# Patient Record
Sex: Male | Born: 1950
Health system: Southern US, Community
[De-identification: ages and names within clinical notes are randomized; demographics above are authoritative.]

## PROBLEM LIST (undated history)

## (undated) ENCOUNTER — Emergency Department (HOSPITAL_COMMUNITY): Discharge status: Absent without leave | Payer: BLUE CROSS/BLUE SHIELD

## (undated) DIAGNOSIS — I739 Peripheral vascular disease, unspecified: Secondary | ICD-10-CM

## (undated) DIAGNOSIS — I10 Essential (primary) hypertension: Secondary | ICD-10-CM

## (undated) DIAGNOSIS — Z8489 Family history of other specified conditions: Secondary | ICD-10-CM

## (undated) DIAGNOSIS — M109 Gout, unspecified: Secondary | ICD-10-CM

## (undated) DIAGNOSIS — I5042 Chronic combined systolic (congestive) and diastolic (congestive) heart failure: Secondary | ICD-10-CM

## (undated) DIAGNOSIS — Z8674 Personal history of sudden cardiac arrest: Secondary | ICD-10-CM

## (undated) DIAGNOSIS — R569 Unspecified convulsions: Secondary | ICD-10-CM

## (undated) DIAGNOSIS — K219 Gastro-esophageal reflux disease without esophagitis: Secondary | ICD-10-CM

## (undated) DIAGNOSIS — C8589 Other specified types of non-Hodgkin lymphoma, extranodal and solid organ sites: Secondary | ICD-10-CM

## (undated) DIAGNOSIS — Z905 Acquired absence of kidney: Secondary | ICD-10-CM

## (undated) DIAGNOSIS — R748 Abnormal levels of other serum enzymes: Secondary | ICD-10-CM

## (undated) DIAGNOSIS — E7800 Pure hypercholesterolemia, unspecified: Secondary | ICD-10-CM

## (undated) DIAGNOSIS — IMO0002 Reserved for concepts with insufficient information to code with codable children: Secondary | ICD-10-CM

## (undated) DIAGNOSIS — R0602 Shortness of breath: Secondary | ICD-10-CM

## (undated) DIAGNOSIS — Z8669 Personal history of other diseases of the nervous system and sense organs: Secondary | ICD-10-CM

## (undated) DIAGNOSIS — Z5181 Encounter for therapeutic drug level monitoring: Secondary | ICD-10-CM

## (undated) DIAGNOSIS — I2510 Atherosclerotic heart disease of native coronary artery without angina pectoris: Secondary | ICD-10-CM

## (undated) DIAGNOSIS — Z87891 Personal history of nicotine dependence: Secondary | ICD-10-CM

## (undated) DIAGNOSIS — E785 Hyperlipidemia, unspecified: Secondary | ICD-10-CM

## (undated) DIAGNOSIS — C801 Malignant (primary) neoplasm, unspecified: Secondary | ICD-10-CM

## (undated) DIAGNOSIS — C8580 Other specified types of non-Hodgkin lymphoma, unspecified site: Secondary | ICD-10-CM

## (undated) HISTORY — PX: EYE SURGERY: SHX253

## (undated) HISTORY — DX: Chronic combined systolic (congestive) and diastolic (congestive) heart failure: I50.42

## (undated) HISTORY — PX: NO PAST SURGERIES: SHX2092

## (undated) HISTORY — DX: Peripheral vascular disease, unspecified: I73.9

## (undated) HISTORY — DX: Personal history of nicotine dependence: Z87.891

## (undated) HISTORY — DX: Gastro-esophageal reflux disease without esophagitis: K21.9

## (undated) HISTORY — DX: Other specified types of non-hodgkin lymphoma, extranodal and solid organ sites: C85.89

## (undated) HISTORY — DX: Atherosclerotic heart disease of native coronary artery without angina pectoris: I25.10

## (undated) HISTORY — DX: Hyperlipidemia, unspecified: E78.5

## (undated) HISTORY — PX: ESOPHAGOGASTRODUODENOSCOPY: SHX1529

## (undated) HISTORY — DX: Reserved for concepts with insufficient information to code with codable children: IMO0002

## (undated) HISTORY — DX: Personal history of other diseases of the nervous system and sense organs: Z86.69

## (undated) HISTORY — DX: Acquired absence of kidney: Z90.5

## (undated) HISTORY — DX: Shortness of breath: R06.02

## (undated) HISTORY — DX: Other specified types of non-hodgkin lymphoma, unspecified site: C85.80

## (undated) HISTORY — DX: Encounter for therapeutic drug level monitoring: Z51.81

## (undated) HISTORY — DX: Gout, unspecified: M10.9

## (undated) HISTORY — DX: Essential (primary) hypertension: I10

## (undated) HISTORY — DX: Abnormal levels of other serum enzymes: R74.8

## (undated) HISTORY — DX: Personal history of sudden cardiac arrest: Z86.74

---

## 2003-06-23 ENCOUNTER — Inpatient Hospital Stay (HOSPITAL_COMMUNITY): Admission: EM | Admit: 2003-06-23 | Discharge: 2003-06-26 | Payer: Self-pay | Admitting: Emergency Medicine

## 2003-07-01 ENCOUNTER — Encounter: Admission: RE | Admit: 2003-07-01 | Discharge: 2003-07-01 | Payer: Self-pay | Admitting: Sports Medicine

## 2004-05-26 ENCOUNTER — Emergency Department: Payer: Self-pay | Admitting: Emergency Medicine

## 2013-06-05 ENCOUNTER — Other Ambulatory Visit: Payer: Self-pay | Admitting: Gastroenterology

## 2013-06-05 DIAGNOSIS — R1310 Dysphagia, unspecified: Secondary | ICD-10-CM

## 2013-06-06 ENCOUNTER — Ambulatory Visit
Admission: RE | Admit: 2013-06-06 | Discharge: 2013-06-06 | Disposition: A | Discharge status: Home / Self Care | Payer: No Typology Code available for payment source | Source: Ambulatory Visit | Attending: Gastroenterology | Admitting: Gastroenterology

## 2013-06-06 DIAGNOSIS — R1310 Dysphagia, unspecified: Secondary | ICD-10-CM

## 2013-06-24 ENCOUNTER — Encounter (HOSPITAL_COMMUNITY): Payer: Self-pay | Admitting: Emergency Medicine

## 2013-06-24 ENCOUNTER — Emergency Department (HOSPITAL_COMMUNITY)
Admission: EM | Admit: 2013-06-24 | Discharge: 2013-06-24 | Disposition: A | Discharge status: Home / Self Care | Payer: No Typology Code available for payment source | Attending: Emergency Medicine | Admitting: Emergency Medicine

## 2013-06-24 DIAGNOSIS — R1013 Epigastric pain: Secondary | ICD-10-CM | POA: Insufficient documentation

## 2013-06-24 DIAGNOSIS — R1310 Dysphagia, unspecified: Secondary | ICD-10-CM

## 2013-06-24 LAB — COMPREHENSIVE METABOLIC PANEL
ALT: 14 U/L (ref 0–53)
AST: 14 U/L (ref 0–37)
Albumin: 4.1 g/dL (ref 3.5–5.2)
Alkaline Phosphatase: 172 U/L — ABNORMAL HIGH (ref 39–117)
BUN: 48 mg/dL — ABNORMAL HIGH (ref 6–23)
CO2: 27 mEq/L (ref 19–32)
Calcium: 9.7 mg/dL (ref 8.4–10.5)
Chloride: 98 mEq/L (ref 96–112)
Creatinine, Ser: 1.45 mg/dL — ABNORMAL HIGH (ref 0.50–1.35)
GFR calc Af Amer: 58 mL/min — ABNORMAL LOW (ref >90)
GFR calc non Af Amer: 50 mL/min — ABNORMAL LOW (ref >90)
Glucose, Bld: 141 mg/dL — ABNORMAL HIGH (ref 70–99)
Potassium: 3.6 mEq/L (ref 3.5–5.1)
Sodium: 137 mEq/L (ref 135–145)
Total Bilirubin: 0.4 mg/dL (ref 0.3–1.2)
Total Protein: 7.6 g/dL (ref 6.0–8.3)

## 2013-06-24 LAB — CBC WITH DIFFERENTIAL/PLATELET
Basophils Absolute: 0 10*3/uL (ref 0.0–0.1)
Basophils Relative: 1 % (ref 0–1)
Eosinophils Absolute: 1.2 10*3/uL — ABNORMAL HIGH (ref 0.0–0.7)
Eosinophils Relative: 13 % — ABNORMAL HIGH (ref 0–5)
HCT: 41.2 % (ref 39.0–52.0)
Hemoglobin: 15 g/dL (ref 13.0–17.0)
Lymphocytes Relative: 21 % (ref 12–46)
Lymphs Abs: 1.8 10*3/uL (ref 0.7–4.0)
MCH: 31.2 pg (ref 26.0–34.0)
MCHC: 36.4 g/dL — ABNORMAL HIGH (ref 30.0–36.0)
MCV: 85.7 fL (ref 78.0–100.0)
Monocytes Absolute: 0.5 10*3/uL (ref 0.1–1.0)
Monocytes Relative: 6 % (ref 3–12)
Neutro Abs: 5.3 10*3/uL (ref 1.7–7.7)
Neutrophils Relative %: 60 % (ref 43–77)
Platelets: 191 10*3/uL (ref 150–400)
RBC: 4.81 MIL/uL (ref 4.22–5.81)
RDW: 12.6 % (ref 11.5–15.5)
WBC: 8.8 10*3/uL (ref 4.0–10.5)

## 2013-06-24 LAB — URINALYSIS, ROUTINE W REFLEX MICROSCOPIC
Bilirubin Urine: NEGATIVE
Glucose, UA: NEGATIVE mg/dL
Hgb urine dipstick: NEGATIVE
Ketones, ur: NEGATIVE mg/dL
Leukocytes, UA: NEGATIVE
Nitrite: NEGATIVE
Protein, ur: 30 mg/dL — AB
Specific Gravity, Urine: 1.024 (ref 1.005–1.030)
Urobilinogen, UA: 0.2 mg/dL (ref 0.0–1.0)
pH: 6.5 (ref 5.0–8.0)

## 2013-06-24 LAB — POCT I-STAT TROPONIN I: Troponin i, poc: 0 ng/mL (ref 0.00–0.08)

## 2013-06-24 LAB — LIPASE, BLOOD: Lipase: 58 U/L (ref 11–59)

## 2013-06-24 LAB — URINE MICROSCOPIC-ADD ON

## 2013-06-24 MED ORDER — SUCRALFATE 1 GM/10ML PO SUSP: 1.0000 g | Freq: Three times a day (TID) | ORAL | Status: DC

## 2013-06-24 MED ORDER — RANITIDINE HCL 150 MG PO TABS: 150.0000 mg | ORAL_TABLET | Freq: Two times a day (BID) | ORAL | Status: DC

## 2013-06-24 MED ORDER — METOCLOPRAMIDE HCL 5 MG/ML IJ SOLN
10.0000 mg | Freq: Once | INTRAMUSCULAR | Status: AC
  Administered 2013-06-24: 10 mg via INTRAVENOUS
  Filled 2013-06-24: qty 2

## 2013-06-24 MED ORDER — FAMOTIDINE IN NACL 20-0.9 MG/50ML-% IV SOLN
20.0000 mg | Freq: Once | INTRAVENOUS | Status: AC
  Administered 2013-06-24: 20 mg via INTRAVENOUS
  Filled 2013-06-24: qty 50

## 2013-06-24 MED ORDER — METOCLOPRAMIDE HCL 10 MG PO TABS: 10.0000 mg | ORAL_TABLET | Freq: Four times a day (QID) | ORAL | Status: DC | PRN

## 2013-06-24 MED ORDER — GI COCKTAIL ~~LOC~~
30.0000 mL | Freq: Once | ORAL | Status: AC
  Administered 2013-06-24: 30 mL via ORAL
  Filled 2013-06-24: qty 30

## 2013-06-24 MED ORDER — ONDANSETRON HCL 4 MG PO TABS: 4.0000 mg | ORAL_TABLET | Freq: Four times a day (QID) | ORAL | Status: DC

## 2013-06-24 NOTE — ED Provider Notes (Signed)
CSN: [id number]     Arrival date & time 06/24/13  1104 History   First MD Initiated Contact with Patient 06/24/13 1123     Chief Complaint  Patient presents with  . Emesis  . Abdominal Pain   (Consider location/radiation/quality/duration/timing/severity/associated sxs/prior Treatment) HPI Comments: Patient presents to the ER for evaluation of upper abdominal pain and inability to deep. Patient has been seen by his primary doctor for this and had an upper GI series performed. Patient reports that he has not improved at all. He has been using Pepto-Bismol and Maalox without improvement. Patient has been regurgitating continuously. He is not nauseated. He has not been able he last 4 days. He is very weak because he hasn't been eating. There has not been any fever. He reports increased belching which brings a clear and greenish liquid.  Patient is a 62 y.o. male presenting with vomiting and abdominal pain.  Emesis Associated symptoms: abdominal pain   Abdominal Pain Associated symptoms: vomiting     History reviewed. No pertinent past medical history. History reviewed. No pertinent past surgical history. No family history on file. History  Substance Use Topics  . Smoking status: Never Smoker   . Smokeless tobacco: Not on file  . Alcohol Use: No    Review of Systems  Gastrointestinal: Positive for vomiting and abdominal pain.  All other systems reviewed and are negative.    Allergies  Review of patient's allergies indicates no known allergies.  Home Medications  No current outpatient prescriptions on file. BP 108/68  Pulse 81  Temp(Src) 97.8 F (36.6 C) (Oral)  Resp 18  Ht 5\' 11"  (1.803 m)  Wt 200 lb (90.719 kg)  BMI 27.91 kg/m2  SpO2 99% Physical Exam  Constitutional: He is oriented to person, place, and time. He appears well-developed and well-nourished. No distress.  HENT:  Head: Normocephalic and atraumatic.  Right Ear: Hearing normal.  Left Ear: Hearing normal.   Nose: Nose normal.  Mouth/Throat: Oropharynx is clear and moist and mucous membranes are normal.  Eyes: Conjunctivae and EOM are normal. Pupils are equal, round, and reactive to light.  Neck: Normal range of motion. Neck supple.  Cardiovascular: Regular rhythm, S1 normal and S2 normal.  Exam reveals no gallop and no friction rub.   No murmur heard. Pulmonary/Chest: Effort normal and breath sounds normal. No respiratory distress. He exhibits no tenderness.  Abdominal: Soft. Normal appearance and bowel sounds are normal. There is no hepatosplenomegaly. There is tenderness (slight) in the epigastric area. There is no rebound, no guarding, no tenderness at McBurney's point and negative Murphy's sign. No hernia.  Musculoskeletal: Normal range of motion.  Neurological: He is alert and oriented to person, place, and time. He has normal strength. No cranial nerve deficit or sensory deficit. Coordination normal. GCS eye subscore is 4. GCS verbal subscore is 5. GCS motor subscore is 6.  Skin: Skin is warm, dry and intact. No rash noted. No cyanosis.  Psychiatric: He has a normal mood and affect. His speech is normal and behavior is normal. Thought content normal.    ED Course  Procedures (including critical care time) Labs Review Labs Reviewed  CBC WITH DIFFERENTIAL  COMPREHENSIVE METABOLIC PANEL  LIPASE, BLOOD  URINALYSIS, ROUTINE W REFLEX MICROSCOPIC   Imaging Review No results found.  EKG Interpretation    Date/Time:  Sunday June 24 2013 11:11:20 EST Ventricular Rate:  87 PR Interval:  126 QRS Duration: 90 QT Interval:  332 QTC Calculation: 399 R Axis:  77 Text Interpretation:  Normal sinus rhythm ST \\T \ T wave abnormality, consider inferior ischemia ST \\T \ T wave abnormality, consider anterolateral ischemia Abnormal ECG No significant change since last tracing Confirmed by POLLINA  MD, CHRISTOPHER ([id number]) on 06/24/2013 11:21:02 AM            MDM   1. Dysphagia    This  presents to the ER for difficulty swallowing and bringing back up clear liquid intermittently when he belches. He has been having this ongoing for one month, seen by his primary doctor. Review of the records reveals previous swallow study without any abnormalities. Patient was treated empirically here in the ER with significant improvement of her symptoms. His workup is unremarkable. Patient is not require hospitalization at this time, would benefit from followup with primary doctor and possibly GI.    Gilda Crease, MD 06/26/13 0730

## 2013-06-24 NOTE — ED Notes (Signed)
Pt states he still feels like he is going to throw up. Pts family at bedside states he has not been "regurgitating" as much.

## 2013-06-24 NOTE — ED Notes (Signed)
Pt alert and mentating appropriately upon d/c. Pt given d/c teaching, prescriptions and follow up care instructions. Pt has no further questions upon d/c. NAD noted upon d/c. Pt ambulatory upon d/c with steady gait leaving with daughter.

## 2013-06-24 NOTE — ED Notes (Signed)
Pt c/o mid upper abdominal pain with n/v x 1 month. Pt has been seen by PMD for same. Pt reports not eating for past 4 days. Pt reports burping up bile colored secretions.

## 2013-08-02 DIAGNOSIS — C801 Malignant (primary) neoplasm, unspecified: Secondary | ICD-10-CM

## 2013-08-02 HISTORY — DX: Malignant (primary) neoplasm, unspecified: C80.1

## 2013-08-02 HISTORY — PX: CORONARY ANGIOPLASTY WITH STENT PLACEMENT: SHX49

## 2013-08-03 ENCOUNTER — Ambulatory Visit: Payer: BC Managed Care – PPO

## 2013-08-03 ENCOUNTER — Ambulatory Visit (INDEPENDENT_AMBULATORY_CARE_PROVIDER_SITE_OTHER): Payer: BC Managed Care – PPO | Admitting: Emergency Medicine

## 2013-08-03 ENCOUNTER — Ambulatory Visit
Admission: RE | Admit: 2013-08-03 | Discharge: 2013-08-03 | Disposition: A | Discharge status: Home / Self Care | Payer: BC Managed Care – PPO | Source: Ambulatory Visit | Attending: Emergency Medicine | Admitting: Emergency Medicine

## 2013-08-03 VITALS — BP 126/72 | HR 107 | Temp 98.1°F | Resp 18 | Wt 170.0 lb

## 2013-08-03 DIAGNOSIS — R634 Abnormal weight loss: Secondary | ICD-10-CM

## 2013-08-03 DIAGNOSIS — R630 Anorexia: Secondary | ICD-10-CM

## 2013-08-03 DIAGNOSIS — R569 Unspecified convulsions: Secondary | ICD-10-CM

## 2013-08-03 DIAGNOSIS — N281 Cyst of kidney, acquired: Secondary | ICD-10-CM

## 2013-08-03 DIAGNOSIS — R195 Other fecal abnormalities: Secondary | ICD-10-CM

## 2013-08-03 DIAGNOSIS — R748 Abnormal levels of other serum enzymes: Secondary | ICD-10-CM

## 2013-08-03 LAB — COMPREHENSIVE METABOLIC PANEL
ALBUMIN: 4.4 g/dL (ref 3.5–5.2)
ALT: 27 U/L (ref 0–53)
AST: 22 U/L (ref 0–37)
Alkaline Phosphatase: 247 U/L — ABNORMAL HIGH (ref 39–117)
BUN: 20 mg/dL (ref 6–23)
CALCIUM: 9.7 mg/dL (ref 8.4–10.5)
CO2: 27 mEq/L (ref 19–32)
Chloride: 99 mEq/L (ref 96–112)
Creat: 1.14 mg/dL (ref 0.50–1.35)
Glucose, Bld: 105 mg/dL — ABNORMAL HIGH (ref 70–99)
POTASSIUM: 4.2 meq/L (ref 3.5–5.3)
SODIUM: 138 meq/L (ref 135–145)
TOTAL PROTEIN: 7.5 g/dL (ref 6.0–8.3)
Total Bilirubin: 0.5 mg/dL (ref 0.3–1.2)

## 2013-08-03 LAB — POCT CBC
Granulocyte percent: 65 %G (ref 37–80)
HCT, POC: 46.6 % (ref 43.5–53.7)
Hemoglobin: 15.1 g/dL (ref 14.1–18.1)
Lymph, poc: 2.7 (ref 0.6–3.4)
MCH, POC: 30.4 pg (ref 27–31.2)
MCHC: 32.4 g/dL (ref 31.8–35.4)
MCV: 93.7 fL (ref 80–97)
MID (cbc): 1.8 — AB (ref 0–0.9)
MPV: 9.8 fL (ref 0–99.8)
POC Granulocyte: 8.3 — AB (ref 2–6.9)
POC LYMPH PERCENT: 20.9 %L (ref 10–50)
POC MID %: 14.1 %M — AB (ref 0–12)
Platelet Count, POC: 219 10*3/uL (ref 142–424)
RBC: 4.97 M/uL (ref 4.69–6.13)
RDW, POC: 14.7 %
WBC: 12.7 10*3/uL — AB (ref 4.6–10.2)

## 2013-08-03 LAB — IFOBT (OCCULT BLOOD): IFOBT: POSITIVE

## 2013-08-03 LAB — GLUCOSE, POCT (MANUAL RESULT ENTRY): POC GLUCOSE: 78 mg/dL (ref 70–99)

## 2013-08-03 LAB — AMYLASE: AMYLASE: 87 U/L (ref 0–105)

## 2013-08-03 LAB — PSA: PSA: 0.58 ng/mL (ref <=4.00)

## 2013-08-03 MED ORDER — ONDANSETRON HCL 4 MG PO TABS: 4.0000 mg | ORAL_TABLET | Freq: Four times a day (QID) | ORAL | Status: DC

## 2013-08-03 MED ORDER — LANSOPRAZOLE 30 MG PO CPDR: DELAYED_RELEASE_CAPSULE | ORAL | Status: DC

## 2013-08-03 MED ORDER — SUCRALFATE 1 G PO TABS: 1.0000 g | ORAL_TABLET | Freq: Three times a day (TID) | ORAL | Status: DC

## 2013-08-03 NOTE — Patient Instructions (Signed)
You can go now for the CT scan at [facility] they will have you drink contrast then have the scan

## 2013-08-03 NOTE — Progress Notes (Addendum)
Subjective:  This chart was scribed for Gilbert Queen, MD by [name], ED scribe.  This patient was seen in [place] [facility] and the patient's care was started at 2:24 PM.   Patient ID: Gilbert [name], male    DOB: Apr 11, 1951, 63 y.o.   MRN: [id number]   HPI  HPI Comments: Gilbert [name] is a 63 y.o. male who presents to [facility] complaining of 2 months of persistent anorexia and weight loss.  Pt states that for the past 2 months he has been vomiting after almost every time he eats.  He has had no appetite and states "everything tastes like dirt."  He has lost from 40-45 pounds in 6 weeks.  He is on an "apple sauce and Ensure diet."  He states "I want to eat, I just can't."  Pt also complains of right-sided chest pain that began 2 weeks ago.  He also has recently experienced some LLQ abdominal pain but this has since resolved.  Pt has seen a GI doctor since his symptoms began (Dr. [name]) and received an upper GI barium and endoscopy which were both unremarkable.  Family is concerned that his symptoms Jackson be related to his pancreas or gallbladder.  He has not smoked in 8 years.  He does not drink alcohol.  He stopped using dip 2 days before Thanksgiving.     There are no active problems to display for this patient.   History reviewed. No pertinent past medical history.  History reviewed. No pertinent past surgical history.  Prior to Admission medications   Medication Sig Start Date End Date Taking? Authorizing Provider  lansoprazole (PREVACID) 15 MG capsule Take 15 mg by mouth daily at 12 noon.   Yes Historical Provider, MD  naproxen sodium (ANAPROX) 220 MG tablet Take 440 mg by mouth every 8 (eight) hours as needed.   Yes Historical Provider, MD  phenytoin (DILANTIN) 300 MG ER capsule Take 300 mg by mouth daily.   Yes Historical Provider, MD  pravastatin (PRAVACHOL) 40 MG tablet Take 40 mg by mouth daily.   Yes Historical Provider, MD  lisinopril-hydrochlorothiazide (PRINZIDE,ZESTORETIC)  20-12.5 MG per tablet Take 1 tablet by mouth daily.    Historical Provider, MD  metoCLOPramide (REGLAN) 10 MG tablet Take 1 tablet (10 mg total) by mouth every 6 (six) hours as needed for nausea (nausea/headache). 06/24/13   Gilbert Greek, MD  ondansetron (ZOFRAN) 4 MG tablet Take 1 tablet (4 mg total) by mouth every 6 (six) hours. 06/24/13   Gilbert Greek, MD  ranitidine (ZANTAC) 150 MG tablet Take 1 tablet (150 mg total) by mouth 2 (two) times daily. 06/24/13   Gilbert Greek, MD  sucralfate (CARAFATE) 1 GM/10ML suspension Take 10 mLs (1 g total) by mouth 4 (four) times daily -  with meals and at bedtime. 06/24/13   Gilbert Greek, MD     Review of Systems  Constitutional: Positive for appetite change and unexpected weight change.  Cardiovascular: Positive for chest pain (right-sided).  Gastrointestinal: Positive for vomiting and abdominal pain (transient LLQ abdominal pain, resolved).        Objective:   Physical Exam CONSTITUTIONAL: Well developed/well nourished HEAD: Normocephalic/atraumatic EYES: EOMI/PERRL ENMT: Mucous membranes moist NECK: supple no meningeal signs SPINE:entire spine nontender CV: S1/S2 noted, no murmurs/rubs/gallops noted LUNGS: Lungs are clear to auscultation bilaterally, no apparent distress ABDOMEN: soft, nontender, no rebound or guarding GU:no cva tenderness NEURO: Pt is awake/alert, moves all extremitiesx4 EXTREMITIES: pulses normal, full  ROM SKIN: warm, color normal PSYCH: no abnormalities of mood noted   BP 126/72  Pulse 107  Temp(Src) 98.1 F (36.7 C) (Oral)  Resp 18  Wt 170 lb (77.111 kg)  SpO2 100%    Results for orders placed in visit on 08/03/13  POCT CBC      Result Value Range   WBC 12.7 (*) 4.6 - 10.2 K/uL   Lymph, poc 2.7  0.6 - 3.4   POC LYMPH PERCENT 20.9  10 - 50 %L   MID (cbc) 1.8 (*) 0 - 0.9   POC MID % 14.1 (*) 0 - 12 %M   POC Granulocyte 8.3 (*) 2 - 6.9   Granulocyte percent 65.0   37 - 80 %G   RBC 4.97  4.69 - 6.13 M/uL   Hemoglobin 15.1  14.1 - 18.1 g/dL   HCT, POC 46.6  43.5 - 53.7 %   MCV 93.7  80 - 97 fL   MCH, POC 30.4  27 - 31.2 pg   MCHC 32.4  31.8 - 35.4 g/dL   RDW, POC 14.7     Platelet Count, POC 219  142 - 424 K/uL   MPV 9.8  0 - 99.8 fL  IFOBT (OCCULT BLOOD)      Result Value Range   IFOBT Positive      [facility] reading (PRIMARY) by  Dr there are 2 possible gallstones in the right upper abdomen. There is some nodularity seen para-aortic area but this Hebert be secondary to stool.       Assessment & Plan:  White count is elevated with a heme positive stool. His abdominal series is suspicious for possible gallstones . I asked him to place his blood pressure medications on hold. I refilled his Carafate Prevacid and Zofran. We'll add a Dilantin level. I told him not to take his blood pressure medications at the present time

## 2013-08-04 LAB — PHENYTOIN LEVEL, TOTAL: PHENYTOIN LVL: 7.9 ug/mL — AB (ref 10.0–20.0)

## 2013-08-07 ENCOUNTER — Telehealth: Payer: Self-pay | Admitting: Radiology

## 2013-08-07 NOTE — Telephone Encounter (Signed)
Message copied by Candice Camp on Tue Aug 07, 2013  3:53 PM ------      Message from: Gilbert [name]      Created: Sat Aug 04, 2013  8:05 AM       His Dilantin level was on the low end his other blood tests including pancreas test was normal. We need to proceed with [name] bone scan and CT of his head. These have been ordered ------

## 2013-08-07 NOTE — Telephone Encounter (Signed)
He has enough refills, will advise to continue current dose.

## 2013-08-07 NOTE — Telephone Encounter (Signed)
Yes he needs to stay on his Dilantin for now. Okay to call in a refill on his prescription with refills for a year

## 2013-08-07 NOTE — Telephone Encounter (Signed)
Patient has ran out of the Dilantin 300mg , he states he has been taking it daily. Do you want him to continue current dose? He has not seen neurology in 30 years CT scan is scheduled for Thursday He is also scheduled for the Bone Scan, explained this to him and his daughter.  Have provided her the number for [facility] so she can check on the referral for the cyst on his kidney.

## 2013-08-08 NOTE — Telephone Encounter (Signed)
Called to advise to continue same dose of the Dilantin.

## 2013-08-09 ENCOUNTER — Ambulatory Visit
Admission: RE | Admit: 2013-08-09 | Discharge: 2013-08-09 | Disposition: A | Discharge status: Home / Self Care | Payer: BC Managed Care – PPO | Source: Ambulatory Visit | Attending: Emergency Medicine | Admitting: Emergency Medicine

## 2013-08-09 DIAGNOSIS — R569 Unspecified convulsions: Secondary | ICD-10-CM

## 2013-08-09 DIAGNOSIS — R634 Abnormal weight loss: Secondary | ICD-10-CM

## 2013-08-10 ENCOUNTER — Telehealth: Payer: Self-pay

## 2013-08-10 NOTE — Telephone Encounter (Signed)
Patient's daughter would like Dr. Everlene Farrier to call her, rather than her father, with the results of the scan. She states he would not fully understand the results.  Best [phone]

## 2013-08-12 NOTE — Telephone Encounter (Signed)
Spoke with pt's daughter and advised her the results of his head CT. Advised to bring pt back in to see Dr Everlene Farrier after his bone scan. She agreed. She is awaiting appointment for bone scan. Can we check on status of referral?

## 2013-08-24 ENCOUNTER — Encounter (HOSPITAL_COMMUNITY)
Admission: RE | Admit: 2013-08-24 | Discharge: 2013-08-24 | Disposition: A | Discharge status: Home / Self Care | Payer: BC Managed Care – PPO | Source: Ambulatory Visit | Attending: Emergency Medicine | Admitting: Emergency Medicine

## 2013-08-24 DIAGNOSIS — R748 Abnormal levels of other serum enzymes: Secondary | ICD-10-CM | POA: Insufficient documentation

## 2013-08-24 MED ORDER — TECHNETIUM TC 99M MEDRONATE IV KIT
25.0000 | PACK | Freq: Once | INTRAVENOUS | Status: AC | PRN
  Administered 2013-08-24: 25 via INTRAVENOUS

## 2013-08-27 ENCOUNTER — Telehealth: Payer: Self-pay | Admitting: Family Medicine

## 2013-08-27 NOTE — Progress Notes (Signed)
spoke to patient concerning message, he is aware

## 2013-08-30 ENCOUNTER — Ambulatory Visit (INDEPENDENT_AMBULATORY_CARE_PROVIDER_SITE_OTHER): Payer: BC Managed Care – PPO | Admitting: Emergency Medicine

## 2013-08-30 VITALS — BP 118/80 | HR 78 | Temp 98.0°F | Resp 18 | Wt 165.0 lb

## 2013-08-30 DIAGNOSIS — Q619 Cystic kidney disease, unspecified: Secondary | ICD-10-CM

## 2013-08-30 DIAGNOSIS — R634 Abnormal weight loss: Secondary | ICD-10-CM

## 2013-08-30 DIAGNOSIS — K921 Melena: Secondary | ICD-10-CM

## 2013-08-30 DIAGNOSIS — N281 Cyst of kidney, acquired: Secondary | ICD-10-CM

## 2013-08-30 LAB — POCT CBC
GRANULOCYTE PERCENT: 66.3 % (ref 37–80)
HEMATOCRIT: 41.7 % — AB (ref 43.5–53.7)
Hemoglobin: 13.2 g/dL — AB (ref 14.1–18.1)
Lymph, poc: 2.1 (ref 0.6–3.4)
MCH, POC: 29.5 pg (ref 27–31.2)
MCHC: 31.7 g/dL — AB (ref 31.8–35.4)
MCV: 93.2 fL (ref 80–97)
MID (cbc): 1.1 — AB (ref 0–0.9)
MPV: 9.1 fL (ref 0–99.8)
POC GRANULOCYTE: 6.4 (ref 2–6.9)
POC LYMPH PERCENT: 21.9 %L (ref 10–50)
POC MID %: 11.5 %M (ref 0–12)
Platelet Count, POC: 276 10*3/uL (ref 142–424)
RBC: 4.47 M/uL — AB (ref 4.69–6.13)
RDW, POC: 14.2 %
WBC: 9.7 10*3/uL (ref 4.6–10.2)

## 2013-08-30 LAB — C-REACTIVE PROTEIN: CRP: 1 mg/dL — AB (ref <0.60)

## 2013-08-30 LAB — URIC ACID: URIC ACID, SERUM: 7.4 mg/dL (ref 4.0–7.8)

## 2013-08-30 LAB — POCT SEDIMENTATION RATE: POCT SED RATE: 65 mm/h — AB (ref 0–22)

## 2013-08-30 LAB — LIPASE: LIPASE: 18 U/L (ref 0–75)

## 2013-08-30 MED ORDER — PREDNISONE 10 MG PO TABS: ORAL_TABLET | ORAL | Status: DC

## 2013-08-30 NOTE — Progress Notes (Signed)
Subjective:    Patient ID: Gilbert [name], male    DOB: 02/14/1951, 63 y.o.   MRN: [id number] This chart was scribed for Gilbert Russian, MD by Anastasia Pall, ED Scribe. This patient was seen in room 09 and the patient's care was started at 1:11 PM.  Chief Complaint  Patient presents with  . Gout    both feet   . Follow-up    wt loss and bone scan    HPI Gilbert [name] is a 63 y.o. male He presents today for a follow up of bone scan. He has h/o gout in bilateral feet, weight loss. He also has h/o kidney problems. He reports pain over his bilateral feet from the gout, with associated swelling. He reports he first started losing weight mid October 2014, stating he weighed 210 lbs then. He reports he has moved on from chicken broth, able to keep down heavier foods. He denies vomiting since last visit. He reports he has still been losing weight, stating he has lost 5 lbs since last visit. He reports he only eats once a day. He states he was taking Ensure and thinks that was the cause of his gout. He has been eating cherries regularly. He states his sense of taste has slowly been coming back. He has been taking cough syrup for intermittent cough. He denies any other associated symptoms. He reports he has been taking Carafate. He denies taking Zofran, due to not feeling nauseous. He denies taking Zantac, but reports taking Prevacid. He states he has been taking his Reglan. He will be making appointment for Urologist after this visit.   PCP - Andreas Blower, MD  Patient Active Problem List   Diagnosis Date Noted  . Loss of weight 08/03/2013   Past Medical History  Diagnosis Date  . Gout    History reviewed. No pertinent past surgical history. No Known Allergies Prior to Admission medications   Medication Sig Start Date End Date Taking? Authorizing Provider  lansoprazole (PREVACID) 30 MG capsule Take one tablet daily 08/03/13  Yes Gilbert Russian, MD  phenytoin (DILANTIN) 300 MG ER capsule  Take 300 mg by mouth daily.   Yes Historical Provider, MD  sucralfate (CARAFATE) 1 G tablet Take 1 tablet (1 g total) by mouth 4 (four) times daily -  with meals and at bedtime. 08/03/13  Yes Gilbert Russian, MD  lisinopril-hydrochlorothiazide (PRINZIDE,ZESTORETIC) 20-12.5 MG per tablet Take 1 tablet by mouth daily.    Historical Provider, MD  metoCLOPramide (REGLAN) 10 MG tablet Take 1 tablet (10 mg total) by mouth every 6 (six) hours as needed for nausea (nausea/headache). 06/24/13   Orpah Greek, MD  naproxen sodium (ANAPROX) 220 MG tablet Take 440 mg by mouth every 8 (eight) hours as needed.    Historical Provider, MD  ondansetron (ZOFRAN) 4 MG tablet Take 1 tablet (4 mg total) by mouth every 6 (six) hours. 08/03/13   Gilbert Russian, MD  pravastatin (PRAVACHOL) 40 MG tablet Take 40 mg by mouth daily.    Historical Provider, MD  ranitidine (ZANTAC) 150 MG tablet Take 1 tablet (150 mg total) by mouth 2 (two) times daily. 06/24/13   Orpah Greek, MD   Review of Systems  Constitutional: Positive for unexpected weight change (loss).  Gastrointestinal: Negative for vomiting.  Musculoskeletal: Positive for arthralgias (bilateral feet, due to h/o gout) and joint swelling (bilateral feet).      Objective:   Physical Exam Nursing note and vitals  reviewed. Constitutional: Pt is oriented to person, place, and time. Pt appears well-developed and well-nourished. No distress.  HENT:  Head: Normocephalic and atraumatic.  Eyes: EOM are normal.  Neck: Neck supple.  Cardiovascular: Normal rate, regular rhythm and normal heart sounds.  Exam reveals no gallop and no friction rub. No murmur heard.  Pulmonary/Chest: Effort normal and breath sounds normal. No respiratory distress. Pt has no wheezes. Pt has no rales.  Abdominal: Soft. No tenderness. No distention, guarding, rebound, masses.  Musculoskeletal: Normal range of motion.  Neurological: Pt is alert and oriented to person, place, and  time.  Skin: Skin is warm and dry.  Psychiatric: Pt has a normal mood and affect. Pt's behavior is normal.   BP 118/80  Pulse 78  Temp(Src) 98 F (36.7 C) (Oral)  Resp 18  Wt 165 lb (74.844 kg)  SpO2 99%   Results for orders placed in visit on 08/03/13  PSA      Result Value Range   PSA 0.58  <=4.00 ng/mL  AMYLASE      Result Value Range   Amylase 87  0 - 105 U/L  COMPREHENSIVE METABOLIC PANEL      Result Value Range   Sodium 138  135 - 145 mEq/L   Potassium 4.2  3.5 - 5.3 mEq/L   Chloride 99  96 - 112 mEq/L   CO2 27  19 - 32 mEq/L   Glucose, Bld 105 (*) 70 - 99 mg/dL   BUN 20  6 - 23 mg/dL   Creat 1.14  0.50 - 1.35 mg/dL   Total Bilirubin 0.5  0.3 - 1.2 mg/dL   Alkaline Phosphatase 247 (*) 39 - 117 U/L   AST 22  0 - 37 U/L   ALT 27  0 - 53 U/L   Total Protein 7.5  6.0 - 8.3 g/dL   Albumin 4.4  3.5 - 5.2 g/dL   Calcium 9.7  8.4 - 10.5 mg/dL  PHENYTOIN LEVEL, TOTAL      Result Value Range   Phenytoin Lvl 7.9 (*) 10.0 - 20.0 ug/mL  POCT CBC      Result Value Range   WBC 12.7 (*) 4.6 - 10.2 K/uL   Lymph, poc 2.7  0.6 - 3.4   POC LYMPH PERCENT 20.9  10 - 50 %L   MID (cbc) 1.8 (*) 0 - 0.9   POC MID % 14.1 (*) 0 - 12 %M   POC Granulocyte 8.3 (*) 2 - 6.9   Granulocyte percent 65.0  37 - 80 %G   RBC 4.97  4.69 - 6.13 M/uL   Hemoglobin 15.1  14.1 - 18.1 g/dL   HCT, POC 46.6  43.5 - 53.7 %   MCV 93.7  80 - 97 fL   MCH, POC 30.4  27 - 31.2 pg   MCHC 32.4  31.8 - 35.4 g/dL   RDW, POC 14.7     Platelet Count, POC 219  142 - 424 K/uL   MPV 9.8  0 - 99.8 fL  IFOBT (OCCULT BLOOD)      Result Value Range   IFOBT Positive    GLUCOSE, POCT (MANUAL RESULT ENTRY)      Result Value Range   POC Glucose 78  70 - 99 mg/dl      Assessment & Plan:  Patient continues with weight loss. He apparently is able to eat better. He is having an active flare of his gout. Referral made to urology for his abnormal  CT with a renal cyst. Referral made to GI because of his inability to be poor  appetite and weight loss. We'll treat with prednisone taper to help with his gout.

## 2013-08-30 NOTE — Progress Notes (Deleted)
Subjective:    Patient ID: Gilbert [name], male    DOB: 06/25/1951, 63 y.o.   MRN: [id number] This chart was scribed for Gilbert Russian, MD by Anastasia Pall, ED Scribe. This patient was seen in room 09 and the patient's care was started at 1:11 PM.  Chief Complaint  Patient presents with   Gout    both feet    Follow-up    wt loss and bone scan    HPI Gilbert [name] is a 63 y.o. male He presents today for a follow up of bone scan. He has h/o gout in bilateral feet, weight loss. He also has h/o kidney problems. He reports pain over his bilateral feet from the gout, with associated swelling. He reports he first started losing weight mid October 2014, stating he weighed 210 lbs then. He reports he has moved on from chicken broth, able to keep down heavier foods. He denies vomiting since last visit. He reports he has still been losing weight, stating he has lost 5 lbs since last visit. He reports he only eats once a day. He states he was taking Ensure and thinks that was the cause of his gout. He has been eating cherries regularly. He states his sense of taste has slowly been coming back. He has been taking cough syrup for intermittent cough. He denies any other associated symptoms. He reports he has been taking Carafate. He denies taking Zofran, due to not feeling nauseous. He denies taking Zantac, but reports taking Prevacid. He states he has been taking his Reglan. He will be making appointment for Urologist after this visit.   PCP - Andreas Blower, MD  Patient Active Problem List   Diagnosis Date Noted   Loss of weight 08/03/2013   Past Medical History  Diagnosis Date   Gout    History reviewed. No pertinent past surgical history. No Known Allergies Prior to Admission medications   Medication Sig Start Date End Date Taking? Authorizing Provider  lansoprazole (PREVACID) 30 MG capsule Take one tablet daily 08/03/13  Yes Gilbert Russian, MD  phenytoin (DILANTIN) 300 MG ER capsule  Take 300 mg by mouth daily.   Yes Historical Provider, MD  sucralfate (CARAFATE) 1 G tablet Take 1 tablet (1 g total) by mouth 4 (four) times daily -  with meals and at bedtime. 08/03/13  Yes Gilbert Russian, MD  lisinopril-hydrochlorothiazide (PRINZIDE,ZESTORETIC) 20-12.5 MG per tablet Take 1 tablet by mouth daily.    Historical Provider, MD  metoCLOPramide (REGLAN) 10 MG tablet Take 1 tablet (10 mg total) by mouth every 6 (six) hours as needed for nausea (nausea/headache). 06/24/13   Orpah Greek, MD  naproxen sodium (ANAPROX) 220 MG tablet Take 440 mg by mouth every 8 (eight) hours as needed.    Historical Provider, MD  ondansetron (ZOFRAN) 4 MG tablet Take 1 tablet (4 mg total) by mouth every 6 (six) hours. 08/03/13   Gilbert Russian, MD  pravastatin (PRAVACHOL) 40 MG tablet Take 40 mg by mouth daily.    Historical Provider, MD  ranitidine (ZANTAC) 150 MG tablet Take 1 tablet (150 mg total) by mouth 2 (two) times daily. 06/24/13   Orpah Greek, MD   Review of Systems  Constitutional: Positive for unexpected weight change (loss).  Gastrointestinal: Negative for vomiting.  Musculoskeletal: Positive for arthralgias (bilateral feet, due to h/o gout) and joint swelling (bilateral feet).      Objective:   Physical Exam Nursing note and vitals  reviewed. Constitutional: Pt is oriented to person, place, and time. Pt appears well-developed and well-nourished. No distress.  HENT:  Head: Normocephalic and atraumatic.  Eyes: EOM are normal.  Neck: Neck supple.  Cardiovascular: Normal rate, regular rhythm and normal heart sounds.  Exam reveals no gallop and no friction rub. No murmur heard.  Pulmonary/Chest: Effort normal and breath sounds normal. No respiratory distress. Pt has no wheezes. Pt has no rales.  Abdominal: Soft. No tenderness. No distention, guarding, rebound, masses.  Musculoskeletal: Normal range of motion.  Neurological: Pt is alert and oriented to person, place, and  time.  Skin: Skin is warm and dry.  Psychiatric: Pt has a normal mood and affect. Pt's behavior is normal.   BP 118/80   Pulse 78   Temp(Src) 98 F (36.7 C) (Oral)   Resp 18   Wt 165 lb (74.844 kg)   SpO2 99%      Assessment & Plan:  Patient continues with weight loss. He apparently is able to eat better. He is having an active flare of his gout.

## 2013-08-31 LAB — PATHOLOGIST SMEAR REVIEW

## 2013-09-06 ENCOUNTER — Other Ambulatory Visit (HOSPITAL_COMMUNITY): Payer: Self-pay | Admitting: Urology

## 2013-09-06 DIAGNOSIS — D49519 Neoplasm of unspecified behavior of unspecified kidney: Secondary | ICD-10-CM

## 2013-09-07 ENCOUNTER — Other Ambulatory Visit: Payer: Self-pay | Admitting: Emergency Medicine

## 2013-09-07 DIAGNOSIS — R634 Abnormal weight loss: Secondary | ICD-10-CM

## 2013-09-07 NOTE — Telephone Encounter (Signed)
Pt notified onocology would be calling to arrage appt.

## 2013-09-07 NOTE — Telephone Encounter (Signed)
Call patient and let him know I spoke with one of the oncology specialist about his weight loss. And about his elevated alkaline phosphatase. They will see the patient and evaluate him. He should receive a call from the office.

## 2013-09-11 LAB — ALKALINE PHOSPHATASE ISOENZYMES
ALKALINE PHOSPHATASE (ALP ISO): 212 U/L — AB (ref 40–115)
Bone Isoenzymes: 8 % — ABNORMAL LOW (ref 28–66)
INTESTINAL ISOENZYMES (ALP ISO): 0 % — AB (ref 1–24)
LIVER ISOENZYMES (ALP ISO): 92 % — AB (ref 25–69)
Macrohepatic isoenzymes: 0 %

## 2013-09-14 ENCOUNTER — Ambulatory Visit (INDEPENDENT_AMBULATORY_CARE_PROVIDER_SITE_OTHER): Payer: BC Managed Care – PPO | Admitting: Emergency Medicine

## 2013-09-14 ENCOUNTER — Ambulatory Visit (HOSPITAL_COMMUNITY)
Admission: RE | Admit: 2013-09-14 | Discharge: 2013-09-14 | Disposition: A | Discharge status: Home / Self Care | Payer: BC Managed Care – PPO | Source: Ambulatory Visit | Attending: Urology | Admitting: Urology

## 2013-09-14 VITALS — BP 130/60 | HR 73 | Temp 98.0°F | Resp 16 | Ht 70.0 in | Wt 174.0 lb

## 2013-09-14 DIAGNOSIS — R748 Abnormal levels of other serum enzymes: Secondary | ICD-10-CM

## 2013-09-14 DIAGNOSIS — R197 Diarrhea, unspecified: Secondary | ICD-10-CM | POA: Insufficient documentation

## 2013-09-14 DIAGNOSIS — D49519 Neoplasm of unspecified behavior of unspecified kidney: Secondary | ICD-10-CM

## 2013-09-14 DIAGNOSIS — M79671 Pain in right foot: Secondary | ICD-10-CM

## 2013-09-14 DIAGNOSIS — M109 Gout, unspecified: Secondary | ICD-10-CM

## 2013-09-14 DIAGNOSIS — R1110 Vomiting, unspecified: Secondary | ICD-10-CM | POA: Insufficient documentation

## 2013-09-14 DIAGNOSIS — D4959 Neoplasm of unspecified behavior of other genitourinary organ: Secondary | ICD-10-CM | POA: Insufficient documentation

## 2013-09-14 DIAGNOSIS — R634 Abnormal weight loss: Secondary | ICD-10-CM

## 2013-09-14 DIAGNOSIS — M79609 Pain in unspecified limb: Secondary | ICD-10-CM

## 2013-09-14 MED ORDER — GADOBENATE DIMEGLUMINE 529 MG/ML IV SOLN
15.0000 mL | Freq: Once | INTRAVENOUS | Status: AC | PRN
  Administered 2013-09-14: 15 mL via INTRAVENOUS

## 2013-09-14 MED ORDER — PREDNISONE 10 MG PO TABS: ORAL_TABLET | ORAL | Status: DC

## 2013-09-14 NOTE — Progress Notes (Signed)
   Subjective:    Patient ID: Gilbert [name], male    DOB: 12-12-1950, 63 y.o.   MRN: [id number]  HPI patient here with redness pain and swelling in his right foot. He has a history of gout. He recently took some prednisone and it helped. He is going to need better. He is scheduled for an MRI later today to further evaluate a cyst. On his last visit here I did fractionate his alkaline phosphatase and it was found to be of liver origin .    Review of Systems     Objective:   Physical Exam the top of the right foot is red tender and swollen. There is significant tenderness at the base of the right great toe.        Assessment & Plan:  Patient here with gout right foot. We'll treat with prednisone and has Lortab per. He's going to have his MRI of the liver today. Will followup next week at that time had the results of his MRI and we can recheck his alkaline phosphatase. Would also consider doing a hep C test

## 2013-09-19 ENCOUNTER — Other Ambulatory Visit: Payer: Self-pay | Admitting: Urology

## 2013-09-25 ENCOUNTER — Ambulatory Visit (INDEPENDENT_AMBULATORY_CARE_PROVIDER_SITE_OTHER): Payer: BC Managed Care – PPO | Admitting: Emergency Medicine

## 2013-09-25 ENCOUNTER — Ambulatory Visit: Payer: BC Managed Care – PPO | Admitting: Emergency Medicine

## 2013-09-25 VITALS — BP 191/97 | HR 78 | Temp 97.8°F | Resp 16 | Ht 70.0 in | Wt 177.0 lb

## 2013-09-25 DIAGNOSIS — H547 Unspecified visual loss: Secondary | ICD-10-CM

## 2013-09-25 DIAGNOSIS — I2584 Coronary atherosclerosis due to calcified coronary lesion: Secondary | ICD-10-CM

## 2013-09-25 DIAGNOSIS — I10 Essential (primary) hypertension: Secondary | ICD-10-CM

## 2013-09-25 DIAGNOSIS — I2510 Atherosclerotic heart disease of native coronary artery without angina pectoris: Secondary | ICD-10-CM

## 2013-09-25 LAB — COMPLETE METABOLIC PANEL WITH GFR
ALT: 10 U/L (ref 0–53)
AST: 12 U/L (ref 0–37)
Albumin: 4.3 g/dL (ref 3.5–5.2)
Alkaline Phosphatase: 120 U/L — ABNORMAL HIGH (ref 39–117)
BILIRUBIN TOTAL: 0.3 mg/dL (ref 0.2–1.2)
BUN: 19 mg/dL (ref 6–23)
CO2: 28 meq/L (ref 19–32)
CREATININE: 0.85 mg/dL (ref 0.50–1.35)
Calcium: 9.8 mg/dL (ref 8.4–10.5)
Chloride: 103 mEq/L (ref 96–112)
GFR, Est Non African American: >89 mL/min
Glucose, Bld: 102 mg/dL — ABNORMAL HIGH (ref 70–99)
Potassium: 4.5 mEq/L (ref 3.5–5.3)
Sodium: 141 mEq/L (ref 135–145)
Total Protein: 7 g/dL (ref 6.0–8.3)

## 2013-09-25 LAB — HEPATITIS C ANTIBODY: HCV AB: NEGATIVE

## 2013-09-25 MED ORDER — LISINOPRIL 20 MG PO TABS: 20.0000 mg | ORAL_TABLET | Freq: Every day | ORAL | Status: DC

## 2013-09-25 MED ORDER — LANSOPRAZOLE 30 MG PO CPDR: DELAYED_RELEASE_CAPSULE | ORAL | Status: DC

## 2013-09-25 NOTE — Progress Notes (Addendum)
Subjective:    Patient ID: Gilbert [name], male    DOB: 12/12/1950, 63 y.o.   MRN: [id number] This chart was scribed for [name], MD by Anastasia Pall, ED Scribe. This patient was seen in room 12 and the patient's care was started at 11:19 AM.  Chief Complaint  Patient presents with  . Follow-up    wt. loss, BP, surgery  . Eye Problem    sees black spots  . Medication Refill   HPI Gilbert [name] is a 63 y.o. male Pt presents for weight loss, BP, and surgery follow up. He has h/o gout, renal cyst. Has surgery for cyst 10/19/13, by Dr. Tresa Moore. Pt has h/o severe weight loss since 05/2013. Weight then was 210 lbs. Has been following up to figure out underlying issue, cause.   He reports being ready for surgery, a bit anxious.   His weight is up from last visit, 177 lbs today. BP the other day was 130/50, BP today 191/97. Recheck in room BP right arm - 168/90, left arm - 168/88.  He denies h/o heart trouble. He reports h/o smoking, last quit 10 years ago, after 41 years.   He also reports seeing black spots in his left eye, decreased vision, onset a few days ago.  PCP - Andreas Blower, MD  Patient Active Problem List   Diagnosis Date Noted  . Renal cyst 08/30/2013  . Loss of weight 08/03/2013   Past Medical History  Diagnosis Date  . Gout    No past surgical history on file. No Known Allergies Prior to Admission medications   Medication Sig Start Date End Date Taking? Authorizing Provider  lansoprazole (PREVACID) 30 MG capsule Take one tablet daily 08/03/13   [name], MD  lisinopril-hydrochlorothiazide (PRINZIDE,ZESTORETIC) 20-12.5 MG per tablet Take 1 tablet by mouth daily.    Historical Provider, MD  metoCLOPramide (REGLAN) 10 MG tablet Take 1 tablet (10 mg total) by mouth every 6 (six) hours as needed for nausea (nausea/headache). 06/24/13   Orpah Greek, MD  naproxen sodium (ANAPROX) 220 MG tablet Take 440 mg by mouth every 8 (eight) hours as needed.     Historical Provider, MD  ondansetron (ZOFRAN) 4 MG tablet Take 1 tablet (4 mg total) by mouth every 6 (six) hours. 08/03/13   [name], MD  phenytoin (DILANTIN) 300 MG ER capsule Take 300 mg by mouth daily.    Historical Provider, MD  pravastatin (PRAVACHOL) 40 MG tablet Take 40 mg by mouth daily.    Historical Provider, MD  predniSONE (DELTASONE) 10 MG tablet Take 4 tablets daily for one full week then 3 tablets a day for 3 days 2 tablets a day for 3 days one tablet a day for 3 days and 09/14/13   [name], MD  ranitidine (ZANTAC) 150 MG tablet Take 1 tablet (150 mg total) by mouth 2 (two) times daily. 06/24/13   Orpah Greek, MD  sucralfate (CARAFATE) 1 G tablet Take 1 tablet (1 g total) by mouth 4 (four) times daily -  with meals and at bedtime. 08/03/13   [name], MD   Review of Systems  Eyes: Positive for visual disturbance (black spots, left eye).  All other systems reviewed and are negative.      Objective:   Physical Exam Nursing note and vitals reviewed. Constitutional: Pt is oriented to person, place, and time. Pt appears well-developed and well-nourished. No distress.  HENT: Right TM nl. Left TM  nl. Oropharynx clear and moist, no exudate. Nose nl.  Head: Normocephalic and atraumatic.  Eyes: EOM are normal. Pupils are equal, round, and reactive to light.  Neck: Neck supple. No thyromegaly. No cervical adenopathy.  Cardiovascular: Normal rate, regular rhythm and normal heart sounds.  Exam reveals no gallop and no friction rub. No murmur heard. Pulmonary/Chest: Effort normal and breath sounds normal. No respiratory distress. Pt has no wheezes. Pt has no rales.  Abdominal: Soft. Bowel sounds are normal. There is no tenderness. There is no rebound and no guarding. No hepatosplenomegaly. No CVA tenderness.  Musculoskeletal: Normal range of motion. No tenderness. No edema.   Neurological: Pt is alert and oriented to person, place, and time.  Skin: Skin is warm and  dry.  Psychiatric: Pt has a normal mood and affect. Pt's behavior is normal.   BP 191/97  Pulse 78  Temp(Src) 97.8 F (36.6 C)  Resp 16  Ht 5\' 10"  (1.778 m)  Wt 177 lb (80.287 kg)  BMI 25.40 kg/m2  SpO2 98%  Recheck BP right arm - 168/90, left arm - 168/88    Assessment & Plan:   Patient declined flu shot today. His EKG is normal. I restarted his lisinopril 20. I left him off the HCTZ because of his recent gout. Addendum to Dr. Zenia Resides  office because of his left visual changes. Appointment was made with cardiology for their clearance. The liver appears normal on MRI however the alkaline phosphatase elevation is coming from the liver. Repeat level done today along with a hep C test. He has been able to gain some weight since his last visit . He is scheduled for renal surgery for suspected cancer in March.     I personally performed the services described in this documentation, which was scribed in my presence. The recorded information has been reviewed and is accurate. **Disclaimer: This note was dictated with voice recognition software. Similar sounding words can inadvertently be transcribed and this note [place] contain transcription errors which [place] not have been corrected upon publication of note.**

## 2013-09-28 ENCOUNTER — Telehealth: Payer: Self-pay

## 2013-09-28 NOTE — Telephone Encounter (Signed)
pts daughter called stating this patient has gout, but per dr Everlene Farrier he did not want to put him on prednisone due to up coming surgery. He has a severe flare up and they want to know what to do

## 2013-09-29 NOTE — Telephone Encounter (Signed)
Ok to give one refill on the prednisone for his gout.Have him see me Wed.

## 2013-09-30 ENCOUNTER — Other Ambulatory Visit: Payer: Self-pay | Admitting: Family Medicine

## 2013-09-30 DIAGNOSIS — M109 Gout, unspecified: Secondary | ICD-10-CM

## 2013-09-30 MED ORDER — PREDNISONE 10 MG PO TABS: ORAL_TABLET | ORAL | Status: DC

## 2013-09-30 NOTE — Telephone Encounter (Signed)
Spoke with patient and sent order into [facility]

## 2013-10-04 DIAGNOSIS — Z0271 Encounter for disability determination: Secondary | ICD-10-CM

## 2013-10-05 ENCOUNTER — Telehealth: Payer: Self-pay | Admitting: Hematology & Oncology

## 2013-10-05 NOTE — Telephone Encounter (Signed)
Left vm w NEW PATIENT today to remind them of their appointment with Dr. Ennever. Also, advised them to bring all meds and insurance information. ° °

## 2013-10-08 ENCOUNTER — Telehealth: Payer: Self-pay | Admitting: Hematology & Oncology

## 2013-10-08 ENCOUNTER — Other Ambulatory Visit (HOSPITAL_BASED_OUTPATIENT_CLINIC_OR_DEPARTMENT_OTHER): Payer: BC Managed Care – PPO | Admitting: Lab

## 2013-10-08 ENCOUNTER — Encounter: Payer: Self-pay | Admitting: Hematology & Oncology

## 2013-10-08 ENCOUNTER — Encounter: Payer: Self-pay | Admitting: Family Medicine

## 2013-10-08 ENCOUNTER — Ambulatory Visit (HOSPITAL_BASED_OUTPATIENT_CLINIC_OR_DEPARTMENT_OTHER): Payer: BC Managed Care – PPO | Admitting: Hematology & Oncology

## 2013-10-08 ENCOUNTER — Ambulatory Visit: Payer: BC Managed Care – PPO

## 2013-10-08 VITALS — BP 146/67 | HR 61 | Temp 97.8°F | Resp 18 | Ht 69.0 in | Wt 179.0 lb

## 2013-10-08 DIAGNOSIS — N289 Disorder of kidney and ureter, unspecified: Secondary | ICD-10-CM

## 2013-10-08 DIAGNOSIS — R634 Abnormal weight loss: Secondary | ICD-10-CM

## 2013-10-08 DIAGNOSIS — N281 Cyst of kidney, acquired: Secondary | ICD-10-CM

## 2013-10-08 DIAGNOSIS — D751 Secondary polycythemia: Secondary | ICD-10-CM

## 2013-10-08 LAB — CBC WITH DIFFERENTIAL (CANCER CENTER ONLY)
BASO#: 0 10*3/uL (ref 0.0–0.2)
BASO%: 0.3 % (ref 0.0–2.0)
EOS ABS: 0.3 10*3/uL (ref 0.0–0.5)
EOS%: 2.6 % (ref 0.0–7.0)
HCT: 41.5 % (ref 38.7–49.9)
HGB: 13.8 g/dL (ref 13.0–17.1)
LYMPH#: 1.2 10*3/uL (ref 0.9–3.3)
LYMPH%: 10.1 % — AB (ref 14.0–48.0)
MCH: 28.8 pg (ref 28.0–33.4)
MCHC: 33.3 g/dL (ref 32.0–35.9)
MCV: 87 fL (ref 82–98)
MONO#: 0.4 10*3/uL (ref 0.1–0.9)
MONO%: 3.4 % (ref 0.0–13.0)
NEUT#: 10 10*3/uL — ABNORMAL HIGH (ref 1.5–6.5)
NEUT%: 83.6 % — ABNORMAL HIGH (ref 40.0–80.0)
PLATELETS: 201 10*3/uL (ref 145–400)
RBC: 4.8 10*6/uL (ref 4.20–5.70)
RDW: 12.8 % (ref 11.1–15.7)
WBC: 12 10*3/uL — AB (ref 4.0–10.0)

## 2013-10-08 LAB — CMP (CANCER CENTER ONLY)
ALK PHOS: 105 U/L — AB (ref 26–84)
ALT: 18 U/L (ref 10–47)
AST: 17 U/L (ref 11–38)
Albumin: 3.3 g/dL (ref 3.3–5.5)
BILIRUBIN TOTAL: 0.4 mg/dL (ref 0.20–1.60)
BUN, Bld: 16 mg/dL (ref 7–22)
CO2: 31 mEq/L (ref 18–33)
CREATININE: 0.8 mg/dL (ref 0.6–1.2)
Calcium: 9.2 mg/dL (ref 8.0–10.3)
Chloride: 104 mEq/L (ref 98–108)
Glucose, Bld: 80 mg/dL (ref 73–118)
Potassium: 4.3 mEq/L (ref 3.3–4.7)
SODIUM: 142 meq/L (ref 128–145)
TOTAL PROTEIN: 6.8 g/dL (ref 6.4–8.1)

## 2013-10-08 LAB — CHCC SATELLITE - SMEAR

## 2013-10-08 NOTE — Telephone Encounter (Signed)
Left pt message with 4-21 follow up

## 2013-10-08 NOTE — Progress Notes (Signed)
Referral MD  Reason for Referral: Right kidney mass   HPI: [name] is a nice 63 year old gentleman. He's been pretty decent health. He does have a history of high blood pressure. Is also a history of a hyper lipidemia. He is also on Dilantin.  He's been followed by Dr. Everlene [name] of internal medicine. He's been having some issues with weight loss. He's lost some more about him about 40 pounds. He said he just did not have much of an appetite. There is no change in bowel or bladder habits. He had no bleeding. He did have some night sweats. There is no cough.  He was that he had a lab work done. His white cell count was a little high side. He was not really anemic. Had elevated sed rate is 65.  He did have a CT scan done. This was done back in early January. The CT scan showed what was felt to be a complex cyst in the right kidney.  An MRI was done on February 15 of this year. This showed a complex 1 x 4.4 x 4.4 cm lesion in the right kidney. This was felt to be concerning for a renal cell carcinoma.  There is no evidence of any type of metastatic disease.  He has been seen by urology. He is going to have a laparoscopic right nephrectomy on March 20.  He has had no pain. He's had no rashes. He's had no leg swelling. He's had no headache. He's had no swallowing difficulties. There's no visual issues.  Overall, he is a very good performance status.  Chief Complaint  Patient presents with  . NEW PATIENT    Past Medical History  Diagnosis Date  . Gout   :  No past surgical history on file.:  Current outpatient prescriptions:lansoprazole (PREVACID) 30 MG capsule, Take one tablet daily, Disp: 30 capsule, Rfl: 5;  lisinopril (PRINIVIL,ZESTRIL) 20 MG tablet, Take 1 tablet (20 mg total) by mouth daily., Disp: 30 tablet, Rfl: 11;  metoprolol tartrate (LOPRESSOR) 25 MG tablet, , Disp: , Rfl: ;  phenytoin (DILANTIN) 300 MG ER capsule, Take 300 mg by mouth daily., Disp: , Rfl:  pravastatin (PRAVACHOL)  40 MG tablet, Take 40 mg by mouth daily., Disp: , Rfl: ;  predniSONE (DELTASONE) 10 MG tablet, Take 4 tablets daily for one full week then 3 tablets a day for 3 days 2 tablets a day for 3 days one tablet a day for 3 days and, Disp: 46 tablet, Rfl: 0;  sucralfate (CARAFATE) 1 G tablet, Take 1 tablet (1 g total) by mouth 4 (four) times daily -  with meals and at bedtime., Disp: 120 tablet, Rfl: 0:  :  No Known Allergies:  Family History  Problem Relation Age of Onset  . Stroke Mother   . Heart disease Brother   . Hyperlipidemia Brother   :  History   Social History  . Marital Status: Single    Spouse Name: N/A    Number of Children: N/A  . Years of Education: N/A   Occupational History  . Not on file.   Social History Main Topics  . Smoking status: Former Smoker -- 2.00 packs/day for 44 years    Types: Cigarettes    Start date: 06/11/1959    Quit date: 10/09/2002  . Smokeless tobacco: Not on file     Comment: quit 10 years ago  . Alcohol Use: No  . Drug Use: No  . Sexual Activity: Not on file  Other Topics Concern  . Not on file   Social History Narrative  . No narrative on file  :  Pertinent items are noted in HPI.  Exam: @IPVITALS @ Will develop well-nourished gentleman. Vital signs are temperature of 97.8. Pulse is 61. Blood pressure 146/67. Weight 179 pounds. Head and neck exam shows no ocular or oral lesions. There is no adenopathy in the neck. Thyroid is nonpalpable. Lungs are clear bilaterally. Cardiac exam regular in rhythm with no murmurs rubs or bruits. Abdomen soft. No guarding or rebound tenderness. There is no mass in the right flank. There is no palpable liver or spleen tip. Back exam no tenderness over the spine ribs or hips. Extremities shows no clubbing cyanosis or edema. Skin exam no rashes ecchymoses or petechia. Neurological exam no focal neurological deficits.   Recent Labs  10/08/13 1250  WBC 12.0*  HGB 13.8  HCT 41.5  PLT 201    Recent  Labs  10/08/13 1251  NA 142  K 4.3  CL 104  CO2 31  GLUCOSE 80  BUN 16  CREATININE 0.8  CALCIUM 9.2    Blood smear review: None  Pathology: None     Assessment and Plan: Gilbert [name] is a 63 year old gentleman. 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 Kidney cancers are notorious for causing systemic issues without having actual metastatic spread. Typically, anemia, or even erythrocytosis can be seen. Hypercalcemia is common. They can have an elevated alkaline phosphatase. He they have night sweats. Weight loss is not uncommon.  Again I don't see anything that looks like metastatic disease. I don't think we need to put him through a bunch of scans right now as he is asymptomatic. It would not change the fact that he needs to have the right kidney removed. In the with metastatic disease, nephrectomies are helpful.  I spent a good 45 minutes with him. I explained what was going on. I explained to him how we follow patient to have had kidney cancer resected.  I probably will see him back in 6 weeks. I probably will plan for some baseline scans about 2 months or so after his surgery.  I will see him in the hospital after he has his surgery. Hopefully, this will be a clear cell carcinoma histology. It looks like this [place] be the case on the MRI report.  Gilbert [name] is very nice. He does have a good sense of humor. He is in good shape.

## 2013-10-09 LAB — LACTATE DEHYDROGENASE: LDH: 136 U/L (ref 94–250)

## 2013-10-09 LAB — TSH CHCC: TSH: 0.721 m[IU]/L (ref 0.320–4.118)

## 2013-10-09 LAB — PREALBUMIN: PREALBUMIN: 30.4 mg/dL (ref 17.0–34.0)

## 2013-10-09 LAB — ERYTHROPOIETIN

## 2013-10-10 ENCOUNTER — Ambulatory Visit (INDEPENDENT_AMBULATORY_CARE_PROVIDER_SITE_OTHER): Payer: BC Managed Care – PPO | Admitting: Emergency Medicine

## 2013-10-10 VITALS — BP 138/74 | HR 62 | Temp 97.6°F | Resp 16 | Ht 69.0 in | Wt 179.8 lb

## 2013-10-10 DIAGNOSIS — I10 Essential (primary) hypertension: Secondary | ICD-10-CM

## 2013-10-10 DIAGNOSIS — N289 Disorder of kidney and ureter, unspecified: Secondary | ICD-10-CM

## 2013-10-10 DIAGNOSIS — N2889 Other specified disorders of kidney and ureter: Secondary | ICD-10-CM | POA: Insufficient documentation

## 2013-10-10 DIAGNOSIS — R634 Abnormal weight loss: Secondary | ICD-10-CM

## 2013-10-10 DIAGNOSIS — E785 Hyperlipidemia, unspecified: Secondary | ICD-10-CM

## 2013-10-10 NOTE — Progress Notes (Signed)
   Subjective:   Patient ID: Gilbert [name], male    DOB: [birth year], 63 y.o.   MRN: [id number]  This chart was scribed for Gilbert Queen, MD by Gilbert [name], ED Scribe. This patient was seen in room 4 and the patient's care was started at 11:35AM.    Gilbert Reichmann, MD  HPI HPI Comments: Gilbert [name] is a 63 y.o. male, with a history of HTN and hyperlipidemia who presents to the [facility] for a f/u visit regarding his upcoming right nephrectomy (for a mass presumed to be cancerous) with surgeon, Dr. Tresa [name]. Pt reports that he attended all of his pre-operative appointments thus far and states he is prepared to move forward with his nephrectomy. Further, pt reports that the issues with his eyes have resolved. He states his eyes were "welded" and he has a follow-up with his opthomologist this week. Pt denies any nausea. Pt states that his current weight is at 179 lbs.   Pt is a former smoker; his quit date was 10/09/2002. Prior to quitting he smoked 2 ppd.    Review of Systems  Constitutional: Negative.  Negative for fatigue and unexpected weight change.  Respiratory: Negative for cough, chest tightness and shortness of breath.   Cardiovascular: Negative for chest pain, palpitations and leg swelling.  Gastrointestinal: Negative for abdominal pain and blood in stool.  Musculoskeletal:       Decreased pain in foot since taking prednisone  Neurological: Negative for dizziness, light-headedness and headaches.  Psychiatric/Behavioral: Negative.    Objective:   Physical Exam  CONSTITUTIONAL: Well developed/well nourished HEAD: Normocephalic/atraumatic EYES: EOMI/PERRL ENMT: Mucous membranes moist NECK: supple no meningeal signs SPINE:entire spine nontender CV: S1/S2 noted, no murmurs/rubs/gallops noted LUNGS: Lungs are clear to auscultation bilaterally, no apparent distress ABDOMEN: soft, nontender, no rebound or guarding GU:no cva tenderness NEURO: Pt is awake/alert, moves all  extremitiesx4 EXTREMITIES: pulses normal, full ROM. Mild erythema over the right second and third toes.  SKIN: warm, color normal PSYCH: no abnormalities of mood noted  Filed Vitals:   10/10/13 1033  BP: 138/74  Pulse: 62  Temp: 97.6 F (36.4 C)  TempSrc: Oral  Resp: 16  Height: 5\' 9"  (1.753 m)  Weight: 179 lb 12.8 oz (81.557 kg)  SpO2: 97%   Assessment & Plan:  Patient is ready for surgery. His gout is improved after a course of prednisone. Will see the patient in about a month after surgery. Cholesterol was done today I personally performed the services described in this documentation, which was scribed in my presence. The recorded information has been reviewed and is accurate.

## 2013-10-11 ENCOUNTER — Encounter (HOSPITAL_COMMUNITY): Payer: Self-pay | Admitting: Pharmacy Technician

## 2013-10-11 LAB — LIPID PANEL
Cholesterol: 224 mg/dL — ABNORMAL HIGH (ref 0–200)
HDL: 63 mg/dL (ref >39)
LDL Cholesterol: 130 mg/dL — ABNORMAL HIGH (ref 0–99)
Total CHOL/HDL Ratio: 3.6 Ratio
Triglycerides: 157 mg/dL — ABNORMAL HIGH (ref <150)
VLDL: 31 mg/dL (ref 0–40)

## 2013-10-12 ENCOUNTER — Telehealth: Payer: Self-pay

## 2013-10-12 NOTE — Telephone Encounter (Signed)
Pt is returning our call for lab results °

## 2013-10-14 NOTE — Telephone Encounter (Signed)
See labs 

## 2013-10-15 ENCOUNTER — Encounter (HOSPITAL_COMMUNITY): Payer: Self-pay

## 2013-10-15 ENCOUNTER — Encounter (HOSPITAL_COMMUNITY)
Admission: RE | Admit: 2013-10-15 | Discharge: 2013-10-15 | Disposition: A | Discharge status: Home / Self Care | Payer: BC Managed Care – PPO | Source: Ambulatory Visit | Attending: Urology | Admitting: Urology

## 2013-10-15 DIAGNOSIS — Z01812 Encounter for preprocedural laboratory examination: Secondary | ICD-10-CM | POA: Insufficient documentation

## 2013-10-15 HISTORY — DX: Gastro-esophageal reflux disease without esophagitis: K21.9

## 2013-10-15 HISTORY — DX: Essential (primary) hypertension: I10

## 2013-10-15 HISTORY — DX: Malignant (primary) neoplasm, unspecified: C80.1

## 2013-10-15 HISTORY — DX: Pure hypercholesterolemia, unspecified: E78.00

## 2013-10-15 HISTORY — DX: Shortness of breath: R06.02

## 2013-10-15 HISTORY — DX: Family history of other specified conditions: Z84.89

## 2013-10-15 LAB — CBC
HCT: 41.8 % (ref 39.0–52.0)
Hemoglobin: 13.8 g/dL (ref 13.0–17.0)
MCH: 28.7 pg (ref 26.0–34.0)
MCHC: 33 g/dL (ref 30.0–36.0)
MCV: 86.9 fL (ref 78.0–100.0)
Platelets: 185 10*3/uL (ref 150–400)
RBC: 4.81 MIL/uL (ref 4.22–5.81)
RDW: 13.2 % (ref 11.5–15.5)
WBC: 12.1 10*3/uL — AB (ref 4.0–10.5)

## 2013-10-15 LAB — BASIC METABOLIC PANEL
BUN: 16 mg/dL (ref 6–23)
CO2: 28 mEq/L (ref 19–32)
Calcium: 9.9 mg/dL (ref 8.4–10.5)
Chloride: 100 mEq/L (ref 96–112)
Creatinine, Ser: 0.73 mg/dL (ref 0.50–1.35)
GFR calc non Af Amer: >90 mL/min (ref >90)
Glucose, Bld: 65 mg/dL — ABNORMAL LOW (ref 70–99)
Potassium: 4.7 mEq/L (ref 3.7–5.3)
Sodium: 139 mEq/L (ref 137–147)

## 2013-10-15 LAB — ABO/RH: ABO/RH(D): A POS

## 2013-10-15 NOTE — Patient Instructions (Addendum)
34 Gilbert [name]  10/15/2013   Your procedure is scheduled on: 10/19/13  Report to [facility] at 9:30 AM.  Call this number if you have problems the morning of surgery [phone]: [phone]   Remember:   Do not eat food or drink liquids After Midnight.     Take these medicines the morning of surgery with A SIP OF WATER: metoprolol, pravastatin, dilantin, prevacid   Do not wear jewelry, make-up or nail polish.  Do not wear lotions, powders, or perfumes. You Sportsman wear deodorant.  Do not shave 48 hours prior to surgery. Men Alexa shave face and neck.  Do not bring valuables to the hospital.  Contacts, dentures or bridgework [place] not be worn into surgery.  Leave suitcase in the car. After surgery it [place] be brought to your room.  For patients admitted to the hospital, checkout time is 11:00 AM the day of discharge.    Please read over the following fact sheets that you were given:[place] preparing for surgery sheet, blood fact sheet Paulette Blanch, RN  pre op nurse call if needed [phone]    FAILURE TO [place]   Patient Signature: ___________________________________________

## 2013-10-15 NOTE — Progress Notes (Signed)
10/15/13 1319  OBSTRUCTIVE SLEEP APNEA  Have you ever been diagnosed with sleep apnea through a sleep study? No  Do you snore loudly (loud enough to be heard through closed doors)?  1  Do you often feel tired, fatigued, or sleepy during the daytime? 0  Has anyone observed you stop breathing during your sleep? 0  Do you have, or are you being treated for high blood pressure? 1  BMI more than 35 kg/m2? 0  Age over 63 years old? 1  Neck circumference greater than 40 cm/18 inches? 0  Gender: 1  Obstructive Sleep Apnea Score 4  Score 4 or greater  Results sent to PCP

## 2013-10-15 NOTE — Progress Notes (Signed)
Stress test and cardiac clearance 10/05/13 Dr. Einar Gip on chart, LOV note 10/03/13 Dr. Einar Gip on chart, Chest x-ray 08/03/13 on [facility], EKG 09/25/13 on EP[facility]

## 2013-10-19 ENCOUNTER — Inpatient Hospital Stay (HOSPITAL_COMMUNITY): Payer: BC Managed Care – PPO | Admitting: Anesthesiology

## 2013-10-19 ENCOUNTER — Encounter (HOSPITAL_COMMUNITY): Payer: BC Managed Care – PPO | Admitting: Anesthesiology

## 2013-10-19 ENCOUNTER — Inpatient Hospital Stay (HOSPITAL_COMMUNITY)
Admission: RE | Admit: 2013-10-19 | Discharge: 2013-10-21 | DRG: 658 | Disposition: A | Discharge status: Home / Self Care | Payer: BC Managed Care – PPO | Source: Ambulatory Visit | Attending: Urology | Admitting: Urology

## 2013-10-19 ENCOUNTER — Encounter (HOSPITAL_COMMUNITY)
Admission: RE | Disposition: A | Discharge status: Home / Self Care | Payer: Self-pay | Source: Ambulatory Visit | Attending: Urology

## 2013-10-19 ENCOUNTER — Encounter (HOSPITAL_COMMUNITY): Payer: Self-pay

## 2013-10-19 DIAGNOSIS — K219 Gastro-esophageal reflux disease without esophagitis: Secondary | ICD-10-CM | POA: Diagnosis present

## 2013-10-19 DIAGNOSIS — H4310 Vitreous hemorrhage, unspecified eye: Secondary | ICD-10-CM | POA: Diagnosis present

## 2013-10-19 DIAGNOSIS — G40909 Epilepsy, unspecified, not intractable, without status epilepticus: Secondary | ICD-10-CM | POA: Diagnosis present

## 2013-10-19 DIAGNOSIS — E7800 Pure hypercholesterolemia, unspecified: Secondary | ICD-10-CM | POA: Diagnosis present

## 2013-10-19 DIAGNOSIS — K660 Peritoneal adhesions (postprocedural) (postinfection): Secondary | ICD-10-CM | POA: Diagnosis present

## 2013-10-19 DIAGNOSIS — Z87891 Personal history of nicotine dependence: Secondary | ICD-10-CM

## 2013-10-19 DIAGNOSIS — Z823 Family history of stroke: Secondary | ICD-10-CM

## 2013-10-19 DIAGNOSIS — Z8249 Family history of ischemic heart disease and other diseases of the circulatory system: Secondary | ICD-10-CM

## 2013-10-19 DIAGNOSIS — M109 Gout, unspecified: Secondary | ICD-10-CM | POA: Diagnosis present

## 2013-10-19 DIAGNOSIS — I10 Essential (primary) hypertension: Secondary | ICD-10-CM | POA: Diagnosis present

## 2013-10-19 DIAGNOSIS — C649 Malignant neoplasm of unspecified kidney, except renal pelvis: Principal | ICD-10-CM | POA: Diagnosis present

## 2013-10-19 HISTORY — PX: ROBOT ASSISTED LAPAROSCOPIC NEPHRECTOMY: SHX5140

## 2013-10-19 LAB — HEMOGLOBIN AND HEMATOCRIT, BLOOD
HCT: 38.9 % — ABNORMAL LOW (ref 39.0–52.0)
Hemoglobin: 13.1 g/dL (ref 13.0–17.0)

## 2013-10-19 LAB — BASIC METABOLIC PANEL
BUN: 16 mg/dL (ref 6–23)
CALCIUM: 9.1 mg/dL (ref 8.4–10.5)
CO2: 24 mEq/L (ref 19–32)
Chloride: 99 mEq/L (ref 96–112)
Creatinine, Ser: 0.95 mg/dL (ref 0.50–1.35)
GFR calc Af Amer: >90 mL/min (ref >90)
GFR, EST NON AFRICAN AMERICAN: 87 mL/min — AB (ref >90)
GLUCOSE: 143 mg/dL — AB (ref 70–99)
POTASSIUM: 4 meq/L (ref 3.7–5.3)
Sodium: 136 mEq/L — ABNORMAL LOW (ref 137–147)

## 2013-10-19 LAB — TYPE AND SCREEN
ABO/RH(D): A POS
ANTIBODY SCREEN: NEGATIVE

## 2013-10-19 SURGERY — ROBOTIC ASSISTED LAPAROSCOPIC NEPHRECTOMY
Anesthesia: General | Site: Abdomen | Laterality: Right

## 2013-10-19 MED ORDER — DOCUSATE SODIUM 100 MG PO CAPS
100.0000 mg | ORAL_CAPSULE | Freq: Two times a day (BID) | ORAL | Status: DC
  Administered 2013-10-19 – 2013-10-21 (×4): 100 mg via ORAL
  Filled 2013-10-19 (×5): qty 1

## 2013-10-19 MED ORDER — ONDANSETRON HCL 4 MG/2ML IJ SOLN: 4.0000 mg | INTRAMUSCULAR | Status: DC | PRN

## 2013-10-19 MED ORDER — ONDANSETRON HCL 4 MG/2ML IJ SOLN
INTRAMUSCULAR | Status: DC | PRN
  Administered 2013-10-19: 4 mg via INTRAVENOUS

## 2013-10-19 MED ORDER — PROPOFOL 10 MG/ML IV BOLUS
INTRAVENOUS | Status: DC | PRN
  Administered 2013-10-19: 160 mg via INTRAVENOUS

## 2013-10-19 MED ORDER — HYDROMORPHONE HCL PF 1 MG/ML IJ SOLN
0.5000 mg | INTRAMUSCULAR | Status: DC | PRN
  Administered 2013-10-19 (×2): 1 mg via INTRAVENOUS
  Filled 2013-10-19 (×2): qty 1

## 2013-10-19 MED ORDER — GLYCOPYRROLATE 0.2 MG/ML IJ SOLN
INTRAMUSCULAR | Status: DC | PRN
  Administered 2013-10-19: 0.6 mg via INTRAVENOUS

## 2013-10-19 MED ORDER — CISATRACURIUM BESYLATE (PF) 10 MG/5ML IV SOLN
INTRAVENOUS | Status: DC | PRN
  Administered 2013-10-19 (×3): 4 mg via INTRAVENOUS
  Administered 2013-10-19: 16 mg via INTRAVENOUS
  Administered 2013-10-19: 4 mg via INTRAVENOUS

## 2013-10-19 MED ORDER — KCL IN DEXTROSE-NACL 10-5-0.45 MEQ/L-%-% IV SOLN
INTRAVENOUS | Status: DC
  Administered 2013-10-19: 1000 mL via INTRAVENOUS
  Administered 2013-10-20: 06:00:00 via INTRAVENOUS
  Administered 2013-10-21: 75 mL via INTRAVENOUS
  Filled 2013-10-19 (×5): qty 1000

## 2013-10-19 MED ORDER — CISATRACURIUM BESYLATE 20 MG/10ML IV SOLN
INTRAVENOUS | Status: AC
  Filled 2013-10-19: qty 10

## 2013-10-19 MED ORDER — SUFENTANIL CITRATE 50 MCG/ML IV SOLN
INTRAVENOUS | Status: DC | PRN
  Administered 2013-10-19 (×3): 10 ug via INTRAVENOUS
  Administered 2013-10-19: 20 ug via INTRAVENOUS

## 2013-10-19 MED ORDER — LACTATED RINGERS IV SOLN: INTRAVENOUS | Status: DC

## 2013-10-19 MED ORDER — BUPIVACAINE LIPOSOME 1.3 % IJ SUSP
20.0000 mL | Freq: Once | INTRAMUSCULAR | Status: AC
  Administered 2013-10-19: 20 mL
  Filled 2013-10-19: qty 20

## 2013-10-19 MED ORDER — HYDROMORPHONE HCL PF 1 MG/ML IJ SOLN
INTRAMUSCULAR | Status: AC
  Filled 2013-10-19: qty 1

## 2013-10-19 MED ORDER — MEPERIDINE HCL 50 MG/ML IJ SOLN: 6.2500 mg | INTRAMUSCULAR | Status: DC | PRN

## 2013-10-19 MED ORDER — PROPOFOL 10 MG/ML IV BOLUS
INTRAVENOUS | Status: AC
  Filled 2013-10-19: qty 20

## 2013-10-19 MED ORDER — OXYCODONE HCL 5 MG PO TABS
5.0000 mg | ORAL_TABLET | ORAL | Status: DC | PRN
  Administered 2013-10-19 – 2013-10-21 (×8): 5 mg via ORAL
  Filled 2013-10-19 (×8): qty 1

## 2013-10-19 MED ORDER — DEXAMETHASONE SODIUM PHOSPHATE 10 MG/ML IJ SOLN
INTRAMUSCULAR | Status: DC | PRN
  Administered 2013-10-19: 10 mg via INTRAVENOUS

## 2013-10-19 MED ORDER — SIMVASTATIN 20 MG PO TABS
20.0000 mg | ORAL_TABLET | Freq: Every day | ORAL | Status: DC
  Administered 2013-10-20: 20 mg via ORAL
  Filled 2013-10-19 (×2): qty 1

## 2013-10-19 MED ORDER — PHENOL 1.4 % MT LIQD
1.0000 | OROMUCOSAL | Status: DC | PRN
  Filled 2013-10-19: qty 177

## 2013-10-19 MED ORDER — PROMETHAZINE HCL 25 MG/ML IJ SOLN: 6.2500 mg | INTRAMUSCULAR | Status: DC | PRN

## 2013-10-19 MED ORDER — STERILE WATER FOR IRRIGATION IR SOLN
Status: DC | PRN
  Administered 2013-10-19: 1500 mL

## 2013-10-19 MED ORDER — HYDROMORPHONE HCL PF 2 MG/ML IJ SOLN
INTRAMUSCULAR | Status: AC
  Filled 2013-10-19: qty 1

## 2013-10-19 MED ORDER — FENTANYL CITRATE 0.05 MG/ML IJ SOLN
INTRAMUSCULAR | Status: DC | PRN
  Administered 2013-10-19 (×2): 50 ug via INTRAVENOUS
  Administered 2013-10-19: 100 ug via INTRAVENOUS
  Administered 2013-10-19: 50 ug via INTRAVENOUS

## 2013-10-19 MED ORDER — SODIUM CHLORIDE 0.9 % IJ SOLN
INTRAMUSCULAR | Status: DC | PRN
  Administered 2013-10-19: 20 mL via INTRAVENOUS

## 2013-10-19 MED ORDER — HYDROMORPHONE HCL PF 1 MG/ML IJ SOLN
INTRAMUSCULAR | Status: DC | PRN
  Administered 2013-10-19 (×2): 0.5 mg via INTRAVENOUS
  Administered 2013-10-19: 1 mg via INTRAVENOUS

## 2013-10-19 MED ORDER — MIDAZOLAM HCL 2 MG/2ML IJ SOLN
INTRAMUSCULAR | Status: AC
  Filled 2013-10-19: qty 2

## 2013-10-19 MED ORDER — PHENYLEPHRINE 40 MCG/ML (10ML) SYRINGE FOR IV PUSH (FOR BLOOD PRESSURE SUPPORT)
PREFILLED_SYRINGE | INTRAVENOUS | Status: AC
  Filled 2013-10-19: qty 10

## 2013-10-19 MED ORDER — DOCUSATE SODIUM 100 MG PO CAPS: 100.0000 mg | ORAL_CAPSULE | Freq: Two times a day (BID) | ORAL | Status: DC

## 2013-10-19 MED ORDER — MENTHOL 3 MG MT LOZG
1.0000 | LOZENGE | OROMUCOSAL | Status: DC | PRN
  Filled 2013-10-19: qty 9

## 2013-10-19 MED ORDER — METOPROLOL TARTRATE 25 MG PO TABS
25.0000 mg | ORAL_TABLET | Freq: Two times a day (BID) | ORAL | Status: DC
  Administered 2013-10-19 – 2013-10-21 (×4): 25 mg via ORAL
  Filled 2013-10-19 (×5): qty 1

## 2013-10-19 MED ORDER — HYDROCODONE-ACETAMINOPHEN 5-325 MG PO TABS: 1.0000 | ORAL_TABLET | Freq: Four times a day (QID) | ORAL | Status: DC | PRN

## 2013-10-19 MED ORDER — ACETAMINOPHEN 500 MG PO TABS
1000.0000 mg | ORAL_TABLET | Freq: Three times a day (TID) | ORAL | Status: AC
  Administered 2013-10-19 – 2013-10-20 (×3): 1000 mg via ORAL
  Filled 2013-10-19 (×4): qty 2

## 2013-10-19 MED ORDER — LIDOCAINE HCL (CARDIAC) 20 MG/ML IV SOLN
INTRAVENOUS | Status: DC | PRN
  Administered 2013-10-19: 100 mg via INTRAVENOUS

## 2013-10-19 MED ORDER — LACTATED RINGERS IR SOLN
Status: DC | PRN
  Administered 2013-10-19: 1

## 2013-10-19 MED ORDER — LACTATED RINGERS IV SOLN
INTRAVENOUS | Status: DC
  Administered 2013-10-19: 13:00:00 via INTRAVENOUS
  Administered 2013-10-19: 1000 mL via INTRAVENOUS

## 2013-10-19 MED ORDER — ACETAMINOPHEN 10 MG/ML IV SOLN
1000.0000 mg | Freq: Once | INTRAVENOUS | Status: AC
  Administered 2013-10-19: 1000 mg via INTRAVENOUS
  Filled 2013-10-19: qty 100

## 2013-10-19 MED ORDER — ONDANSETRON HCL 4 MG/2ML IJ SOLN
INTRAMUSCULAR | Status: AC
  Filled 2013-10-19: qty 2

## 2013-10-19 MED ORDER — PHENYTOIN SODIUM EXTENDED 100 MG PO CAPS
300.0000 mg | ORAL_CAPSULE | Freq: Every day | ORAL | Status: DC
  Administered 2013-10-20 – 2013-10-21 (×2): 300 mg via ORAL
  Filled 2013-10-19 (×2): qty 3

## 2013-10-19 MED ORDER — PANTOPRAZOLE SODIUM 40 MG PO TBEC
40.0000 mg | DELAYED_RELEASE_TABLET | Freq: Every day | ORAL | Status: DC
  Administered 2013-10-20 – 2013-10-21 (×2): 40 mg via ORAL
  Filled 2013-10-19 (×2): qty 1

## 2013-10-19 MED ORDER — NEOSTIGMINE METHYLSULFATE 1 MG/ML IJ SOLN
INTRAMUSCULAR | Status: DC | PRN
  Administered 2013-10-19: 5 mg via INTRAVENOUS

## 2013-10-19 MED ORDER — FENTANYL CITRATE 0.05 MG/ML IJ SOLN
INTRAMUSCULAR | Status: AC
  Filled 2013-10-19: qty 5

## 2013-10-19 MED ORDER — PHENYLEPHRINE HCL 10 MG/ML IJ SOLN
INTRAMUSCULAR | Status: DC | PRN
  Administered 2013-10-19 (×2): 80 ug via INTRAVENOUS

## 2013-10-19 MED ORDER — MIDAZOLAM HCL 5 MG/5ML IJ SOLN
INTRAMUSCULAR | Status: DC | PRN
  Administered 2013-10-19: 2 mg via INTRAVENOUS

## 2013-10-19 MED ORDER — GLYCOPYRROLATE 0.2 MG/ML IJ SOLN
INTRAMUSCULAR | Status: AC
  Filled 2013-10-19: qty 3

## 2013-10-19 MED ORDER — SENNA 8.6 MG PO TABS
1.0000 | ORAL_TABLET | Freq: Two times a day (BID) | ORAL | Status: DC
  Administered 2013-10-19 – 2013-10-21 (×4): 8.6 mg via ORAL
  Filled 2013-10-19 (×4): qty 1

## 2013-10-19 MED ORDER — LIDOCAINE HCL (CARDIAC) 20 MG/ML IV SOLN
INTRAVENOUS | Status: AC
  Filled 2013-10-19: qty 5

## 2013-10-19 MED ORDER — SODIUM CHLORIDE 0.9 % IJ SOLN
INTRAMUSCULAR | Status: AC
  Filled 2013-10-19: qty 20

## 2013-10-19 MED ORDER — DEXAMETHASONE SODIUM PHOSPHATE 10 MG/ML IJ SOLN
INTRAMUSCULAR | Status: AC
  Filled 2013-10-19: qty 1

## 2013-10-19 MED ORDER — CEFAZOLIN SODIUM-DEXTROSE 2-3 GM-% IV SOLR
2.0000 g | INTRAVENOUS | Status: AC
  Administered 2013-10-19: 2 g via INTRAVENOUS

## 2013-10-19 MED ORDER — SUCRALFATE 1 G PO TABS
1.0000 g | ORAL_TABLET | Freq: Three times a day (TID) | ORAL | Status: DC
  Administered 2013-10-19 – 2013-10-21 (×7): 1 g via ORAL
  Filled 2013-10-19 (×10): qty 1

## 2013-10-19 MED ORDER — CEFAZOLIN SODIUM-DEXTROSE 2-3 GM-% IV SOLR
INTRAVENOUS | Status: AC
  Filled 2013-10-19: qty 50

## 2013-10-19 MED ORDER — HYDROMORPHONE HCL PF 1 MG/ML IJ SOLN
0.2500 mg | INTRAMUSCULAR | Status: DC | PRN
  Administered 2013-10-19: 0.5 mg via INTRAVENOUS

## 2013-10-19 MED ORDER — SUFENTANIL CITRATE 50 MCG/ML IV SOLN
INTRAVENOUS | Status: AC
  Filled 2013-10-19: qty 1

## 2013-10-19 SURGICAL SUPPLY — 53 items
ADH SKN CLS APL DERMABOND .7 (GAUZE/BANDAGES/DRESSINGS) ×1
CHLORAPREP W/TINT 26ML (MISCELLANEOUS) ×3 IMPLANT
CLIP LIGATING HEM O LOK PURPLE (MISCELLANEOUS) ×2 IMPLANT
CLIP LIGATING HEMO LOK XL GOLD (MISCELLANEOUS) ×3 IMPLANT
CLIP LIGATING HEMO O LOK GREEN (MISCELLANEOUS) ×2 IMPLANT
CORDS BIPOLAR (ELECTRODE) ×3 IMPLANT
COVER SURGICAL LIGHT HANDLE (MISCELLANEOUS) ×3 IMPLANT
COVER TIP SHEARS 8 DVNC (MISCELLANEOUS) ×1 IMPLANT
COVER TIP SHEARS 8MM DA VINCI (MISCELLANEOUS) ×2
CUTTER ECHEON FLEX ENDO 45 340 (ENDOMECHANICALS) ×3 IMPLANT
DECANTER SPIKE VIAL GLASS SM (MISCELLANEOUS) ×1 IMPLANT
DERMABOND ADVANCED (GAUZE/BANDAGES/DRESSINGS) ×2
DERMABOND ADVANCED .7 DNX12 (GAUZE/BANDAGES/DRESSINGS) ×2 IMPLANT
DRAIN CHANNEL 15F RND FF 3/16 (WOUND CARE) ×3 IMPLANT
DRAPE INCISE IOBAN 66X45 STRL (DRAPES) ×3 IMPLANT
DRAPE LAPAROSCOPIC ABDOMINAL (DRAPES) ×3 IMPLANT
DRAPE LG THREE QUARTER DISP (DRAPES) ×6 IMPLANT
DRAPE TABLE BACK 44X90 PK DISP (DRAPES) ×3 IMPLANT
DRAPE WARM FLUID 44X44 (DRAPE) ×3 IMPLANT
ELECT REM PT RETURN 9FT ADLT (ELECTROSURGICAL) ×6
ELECTRODE REM PT RTRN 9FT ADLT (ELECTROSURGICAL) ×2 IMPLANT
EVACUATOR SILICONE 100CC (DRAIN) ×3 IMPLANT
GLOVE BIOGEL M STRL SZ7.5 (GLOVE) ×6 IMPLANT
GOWN STRL REUS W/TWL LRG LVL3 (GOWN DISPOSABLE) ×6 IMPLANT
KIT ACCESSORY DA VINCI DISP (KITS) ×2
KIT ACCESSORY DVNC DISP (KITS) ×1 IMPLANT
KIT BASIN OR (CUSTOM PROCEDURE TRAY) ×3 IMPLANT
PENCIL BUTTON HOLSTER BLD 10FT (ELECTRODE) ×3 IMPLANT
POSITIONER SURGICAL ARM (MISCELLANEOUS) ×6 IMPLANT
POUCH ENDO CATCH II 15MM (MISCELLANEOUS) ×3 IMPLANT
RELOAD WH ECHELON 45 (STAPLE) ×27 IMPLANT
RELOAD WHITE ECR60W (STAPLE) ×2 IMPLANT
SET TUBE IRRIG SUCTION NO TIP (IRRIGATION / IRRIGATOR) ×2 IMPLANT
SOLUTION ANTI FOG 6CC (MISCELLANEOUS) ×3 IMPLANT
SOLUTION ELECTROLUBE (MISCELLANEOUS) ×3 IMPLANT
SPONGE LAP 18X18 X RAY DECT (DISPOSABLE) ×1 IMPLANT
SPONGE LAP 4X18 X RAY DECT (DISPOSABLE) ×3 IMPLANT
SUT ETHILON 3 0 PS 1 (SUTURE) ×3 IMPLANT
SUT MNCRL AB 4-0 PS2 18 (SUTURE) ×6 IMPLANT
SUT PDS AB 1 CT1 27 (SUTURE) ×8 IMPLANT
SUT PDS AB 1 TP1 54 (SUTURE) ×6 IMPLANT
SUT VICRYL 0 UR6 27IN ABS (SUTURE) ×6 IMPLANT
SYR BULB IRRIGATION 50ML (SYRINGE) IMPLANT
TOWEL OR 17X26 10 PK STRL BLUE (TOWEL DISPOSABLE) ×6 IMPLANT
TOWEL OR NON WOVEN STRL DISP B (DISPOSABLE) ×6 IMPLANT
TRAY FOLEY CATH 14FRSI W/METER (CATHETERS) ×3 IMPLANT
TRAY LAP CHOLE (CUSTOM PROCEDURE TRAY) ×3 IMPLANT
TROCAR 12M 150ML BLUNT (TROCAR) ×3 IMPLANT
TROCAR BLADELESS OPT 5 75 (ENDOMECHANICALS) ×3 IMPLANT
TROCAR UNIVERSAL OPT 12M 100M (ENDOMECHANICALS) ×3 IMPLANT
TROCAR XCEL 12X100 BLDLESS (ENDOMECHANICALS) ×3 IMPLANT
TUBING INSUFFLATION 10FT LAP (TUBING) ×3 IMPLANT
WATER STERILE IRR 1500ML POUR (IV SOLUTION) ×6 IMPLANT

## 2013-10-19 NOTE — Anesthesia Procedure Notes (Signed)
Procedure Name: Intubation Date/Time: 10/19/2013 12:30 PM Performed by: Danley Danker L Patient Re-evaluated:Patient Re-evaluated prior to inductionOxygen Delivery Method: Circle system utilized Preoxygenation: Pre-oxygenation with 100% oxygen Intubation Type: IV induction Ventilation: Mask ventilation without difficulty and Oral airway inserted - appropriate to patient size Laryngoscope Size: Miller and 3 Grade View: Grade I Tube type: Oral Tube size: 8.0 mm Number of attempts: 1 Airway Equipment and Method: Stylet Placement Confirmation: ETT inserted through vocal cords under direct vision,  positive ETCO2 and breath sounds checked- equal and bilateral Secured at: 22 cm Tube secured with: Tape Dental Injury: Teeth and Oropharynx as per pre-operative assessment

## 2013-10-19 NOTE — H&P (Signed)
Gilbert [name] is an 63 y.o. male.    Chief Complaint: Pre-Op Rt Robotic Radical Nephrectomy  HPI:   1 - Rt Cystic Renal Mass - Pt with incidental 4.5 cm cystic mass by CT 08/2012 on work-up of unintentional weight loss. Dedicate Abd MRI confirms 5 cm solid enhancing mass c/w renal cancer, 2 artery / 2 vein renovascular anatomy, no additional abd or contralateral renal lesions. Cr 0.73. Alk Phos minimally elevated at 126, bone scan negative, and fractionation favors intrinsic liver disease as source.   2 - Prostate Screening - No FHX prostate cancer 09/2013 - DRE 25 gm smooth / PSA 0.58 (at PCP office)  PMH sig for GERD, Seizures (very well controlled on Dilantin), vitreous hemorrhage. Weight loss now stabilized and reversed somewhat. No CV disease. No prior surgeries.  Today Gilbert [name] is seen to proceed with right robotic nephrectomy for his worrisome renal mass. I discussed his new diagnosis of visual floaters due to vitreous hemorrhage with his ophthalmologist Dr. Dione Booze in interval who had no specific precautions or contraindications to surgery today.   Past Medical History  Diagnosis Date  . Gout   . GERD (gastroesophageal reflux disease)   . Hypertension   . Hypercholesteremia   . Cancer     right kidney cancer  . Shortness of breath     with activity  . Epilepsy     at least 10 years ago   . Family history of anesthesia complication     mother-had stroke during anesthesia    Past Surgical History  Procedure Laterality Date  . No past surgeries    . Eye surgery Left 2 weeks ago    torn retina    Family History  Problem Relation Age of Onset  . Stroke Mother   . Heart disease Brother   . Hyperlipidemia Brother    Social History:  reports that he quit smoking about 11 years ago. His smoking use included Cigarettes. He started smoking about 54 years ago. He has a 88 pack-year smoking history. He quit smokeless tobacco use about 14 months ago. His smokeless tobacco use included  Chew. He reports that he does not drink alcohol or use illicit drugs.  Allergies: No Known Allergies  No prescriptions prior to admission    No results found for this or any previous visit (from the past 48 hour(s)). No results found.  Review of Systems  Constitutional: Positive for weight loss.  HENT: Negative.   Eyes: Positive for blurred vision.  Respiratory: Negative.   Cardiovascular: Negative.   Gastrointestinal: Negative.   Genitourinary: Negative.  Negative for flank pain.  Musculoskeletal: Negative.   Skin: Negative.   Neurological: Negative.   Endo/Heme/Allergies: Negative.   Psychiatric/Behavioral: Negative.     There were no vitals taken for this visit. Physical Exam  Constitutional: He is oriented to person, place, and time. He appears well-developed and well-nourished.  HENT:  Head: Normocephalic and atraumatic.  Eyes: EOM are normal. Pupils are equal, round, and reactive to light.  Neck: Normal range of motion. Neck supple.  Cardiovascular: Normal rate and regular rhythm.   Respiratory: Effort normal.  GI: Soft.  Genitourinary: Penis normal.  No CVAT  Musculoskeletal: Normal range of motion.  Neurological: He is alert and oriented to person, place, and time.  Skin: Skin is warm and dry.  Psychiatric: He has a normal mood and affect. His behavior is normal. Judgment and thought content normal.     Assessment/Plan  1 - Rt Cystic Renal  Mass - MRI, CT, Bone scan, CXR with localized likely renal cell carcinoma. Favor robotic radical nephrectomy as mass >4cm, normal GFR, normal contralateral kidney and this offers highest chance of cure.  We rediscussed the role of radical nephrectomy with the overall goal of complete surgical excision (negative margins) and better staging / diagnosis. We specifically rediscussed that with removal of the kidney there would be an overall renal function decline with attendant risks of renal failure and need for dialysis in some  cases, and need for kidney-friendly lifestyle post-op with excellent blood pressure and glycemic control. We then rediscussed surgical approaches including robotic and open techniques with robotic associated with a shorter convalescence. I showed the patient on their abdomen the approximately 4-6 incision (trocar) sites as well as presumed extraction sites with robotic approach as well as possible open incision sites. We specifically readdressed that there [place] be need to alter operative plans according to intraopertive findings including conversion to open procedure. We rediscussed specific peri-operative risks including bleeding, infection, deep vein thrombosis, pulmonary embolism, compartment syndrome, nuropathy / neuropraxia, heart attack, stroke, death, as well as long-term risks such as non-cure / need for additional therapy and need for imaging and lab based post-op surveillance protocols. We rediscussed typical hospital course of approximately 2 day hospitalization, need for peri-operative drains / catheters, and typical post-hospital course with return to most non-strenuous activities by 2 weeks and ability to return to most jobs and more strenuous activity such as exercise by 6 weeks.   After this lengthy and detail discussion, including answering all of the patient's questions to their satisfaction, they have chosen to proceed with Rt robotic radical nephrectomy today as planned.  2 - Prostate Screening - up to date this year, continue annual screening.   Gilbert [name] 10/19/2013, 5:09 AM

## 2013-10-19 NOTE — Anesthesia Preprocedure Evaluation (Signed)
Anesthesia Evaluation  Patient identified by MRN, date of birth, ID band Patient awake    Reviewed: Allergy & Precautions, H&P , NPO status , Patient's Chart, lab work & pertinent test results  History of Anesthesia Complications Negative for: history of anesthetic complications  Airway Mallampati: II TM Distance: >3 FB Neck ROM: Full    Dental no notable dental hx.    Pulmonary former smoker,  breath sounds clear to auscultation  Pulmonary exam normal       Cardiovascular hypertension, Pt. on medications - CAD Rhythm:Regular Rate:Normal  Negative stress test 3/15   Neuro/Psych Seizures -, Well Controlled,  negative psych ROS   GI/Hepatic negative GI ROS, Neg liver ROS,   Endo/Other  negative endocrine ROS  Renal/GU negative Renal ROS  negative genitourinary   Musculoskeletal negative musculoskeletal ROS (+)   Abdominal   Peds negative pediatric ROS (+)  Hematology negative hematology ROS (+)   Anesthesia Other Findings   Reproductive/Obstetrics negative OB ROS                           Anesthesia Physical Anesthesia Plan  ASA: II  Anesthesia Plan: General   Post-op Pain Management:    Induction: Intravenous  Airway Management Planned: Oral ETT  Additional Equipment:   Intra-op Plan:   Post-operative Plan: Extubation in OR  Informed Consent: I have reviewed the patients History and Physical, chart, labs and discussed the procedure including the risks, benefits and alternatives for the proposed anesthesia with the patient or authorized representative who has indicated his/her understanding and acceptance.   Dental advisory given  Plan Discussed with: CRNA  Anesthesia Plan Comments:         Anesthesia Quick Evaluation

## 2013-10-19 NOTE — Brief Op Note (Signed)
10/19/2013  3:33 PM  PATIENT:  Gwyndolyn Saxon Genrich  63 y.o. male  PRE-OPERATIVE DIAGNOSIS:  RIGHT RENAL MASS  POST-OPERATIVE DIAGNOSIS:  RIGHT RENAL MASS  PROCEDURE:  Procedure(s): ROBOTIC ASSISTED LAPAROSCOPIC NEPHRECTOMY,  EXTENSIVE ADHESIOLYSIS (Right)  SURGEON:  Surgeon(s) and Role:    * Alexis Frock, MD - Primary  PHYSICIAN ASSISTANT:   ASSISTANTS: Felipa Furnace, PA   ANESTHESIA:   local and general  EBL:  Total I/O In: 1000 [I.V.:1000] Out: 250 [Urine:150; Blood:100]  BLOOD ADMINISTERED:none  DRAINS: foley to straight drain   LOCAL MEDICATIONS USED:  MARCAINE     SPECIMEN:  Source of Specimen:  Rt radical nephrectomy  DISPOSITION OF SPECIMEN:  PATHOLOGY  COUNTS:  YES  TOURNIQUET:  * No tourniquets in log *  DICTATION: .Other Dictation: Dictation Number [id number]  PLAN OF CARE: Admit to inpatient   PATIENT DISPOSITION:  PACU - hemodynamically stable.   Delay start of Pharmacological VTE agent (>24hrs) due to surgical blood loss or risk of bleeding: yes

## 2013-10-19 NOTE — Transfer of Care (Signed)
Immediate Anesthesia Transfer of Care Note  Patient: Gilbert [name]  Procedure(s) Performed: Procedure(s) (LRB): ROBOTIC ASSISTED LAPAROSCOPIC NEPHRECTOMY,  EXTENSIVE ADHESIOLYSIS (Right)  Patient Location: PACU  Anesthesia Type: General  Level of Consciousness: sedated, patient cooperative and responds to stimulation  Airway & Oxygen Therapy: Patient Spontanous Breathing and Patient connected to face mask oxgen  Post-op Assessment: Report given to PACU RN and Post -op Vital signs reviewed and stable  Post vital signs: Reviewed and stable  Complications: No apparent anesthesia complications

## 2013-10-19 NOTE — Discharge Instructions (Signed)
1.  Activity:  You are encouraged to ambulate frequently (about every hour during waking hours) to help prevent blood clots from forming in your legs or lungs.  However, you should not engage in any heavy lifting (> 10-15 lbs), strenuous activity, or straining. °2. Diet: You should advance your diet as instructed by your physician.  It will be normal to have some bloating, nausea, and abdominal discomfort intermittently. °3. Prescriptions:  You will be provided a prescription for pain medication to take as needed.  If your pain is not severe enough to require the prescription pain medication, you Polsky take extra strength Tylenol instead which will have less side effects.  You should also take a prescribed stool softener to avoid straining with bowel movements as the prescription pain medication Prats constipate you. °4. Incisions: You Neff remove your dressing bandages 48 hours after surgery if not removed in the hospital.  You will either have some small staples or special tissue glue at each of the incision sites. Once the bandages are removed (if present), the incisions Strzelecki stay open to air.  You Albritton start showering (but not soaking or bathing in water) the 2nd day after surgery and the incisions simply need to be patted dry after the shower.  No additional care is needed. °5. What to call us about: You should call the office ([phone]) if you develop fever > 101 or develop persistent vomiting. °

## 2013-10-19 NOTE — Preoperative (Signed)
Beta Blockers   Reason not to administer Beta Blockers:Not Applicable 

## 2013-10-20 LAB — BASIC METABOLIC PANEL
BUN: 15 mg/dL (ref 6–23)
CALCIUM: 9.2 mg/dL (ref 8.4–10.5)
CHLORIDE: 98 meq/L (ref 96–112)
CO2: 27 mEq/L (ref 19–32)
Creatinine, Ser: 1.04 mg/dL (ref 0.50–1.35)
GFR calc Af Amer: 87 mL/min — ABNORMAL LOW (ref >90)
GFR calc non Af Amer: 75 mL/min — ABNORMAL LOW (ref >90)
Glucose, Bld: 115 mg/dL — ABNORMAL HIGH (ref 70–99)
Potassium: 4.2 mEq/L (ref 3.7–5.3)
SODIUM: 136 meq/L — AB (ref 137–147)

## 2013-10-20 LAB — HEMOGLOBIN AND HEMATOCRIT, BLOOD
HCT: 37.1 % — ABNORMAL LOW (ref 39.0–52.0)
Hemoglobin: 12.3 g/dL — ABNORMAL LOW (ref 13.0–17.0)

## 2013-10-20 NOTE — Progress Notes (Signed)
Pt ambulated two times today. Tolerated well. Ambulated total 300 feet. Pain controlled with oral pain medicine.

## 2013-10-20 NOTE — Op Note (Addendum)
NAMEMarland Kitchen  KEVON, TENCH NO.:  [id number]  MEDICAL RECORD NO.:  [id number]  LOCATION:  7673                         FACILITY:  [facility]  PHYSICIAN:  Alexis Frock, MD     DATE OF BIRTH:  [birth year]  DATE OF PROCEDURE:  10/19/2013 DATE OF DISCHARGE:                              OPERATIVE REPORT   DIAGNOSIS:  Right renal mass.  PROCEDURE: 1. Robotic-assisted laparoscopic radical nephrectomy. 2. Extensive adhesiolysis.  FINDINGS: 1. Extensive omental and cecum adhesions in the right hemi-abdomen and     right lower quadrant, consistent with likely prior appendicitis. 2. Dense desmoplastic reaction around the area of the right renal     hilum including significant adhesions and desmoplasia between the C-     loop of the duodenum and anterior surface of the renal mass.  ESTIMATED BLOOD LOSS:  100 mL.  COMPLICATIONS:  None.  SPECIMENS: 1. Right radical nephrectomy.  ASSISTANT:  Leta Baptist, PA.  MODIFIER:  Additional 30 minutes spent on adhesiolysis alone in addition to right robotic radical nephrectomy.  INDICATION:  Mr. Mezquita is a pleasant 63 year old gentleman who was found on workup of progressive weight loss and fatigue to have a complex right renal mass, clinical T1b, enhancing mass involving the anterior surface of the kidney that was quite endophytic abutting renal sinus fat. Axial imaging and chest x-ray revealed no obvious distant disease or pathological lymphadenopathy.  Options were discussed including nephrectomy versus partial nephrectomy and he wished to proceed with radical nephrectomy.  Informed consent was obtained and placed in medical record.  PROCEDURE IN DETAIL:  The patient being Xavien verified.  Procedure being right robotic radical nephrectomy was confirmed.  Procedure was carried out.  Time-out was performed.  Intravenous antibiotics were administered.  General endotracheal anesthesia was introduced.  The patient placed in  right side up full flank position and pulling at 15 degrees stable flexion, superior arm elevator, axillary roll, sequential compression devices, and bean bag.  He was further fashioned on the operative table using 3-inch tape over foam padding.  Foley catheter was placed per urethra to straight drain sterilely.  Next, sterile field was created by prepping and draping patient's infra-xiphoid abdomen using chlorhexidine gluconate after clipper shaving.  These did include the midline and umbilicus.  Next, a high-flow low pressure pneumoperitoneum was obtained using Veress technique in the right lower quadrant having passed the aspiration and drop tests.  Robotic camera port was then placed in position approximately 4 fingerbreadths superior lateral to the umbilicus.  Laparoscopic examination of the peritoneal cavity revealed significant dense adhesions between the omentum and cecum in the anterior abdominal wall.  No visceral injury was noted.  Additional ports were carefully placed as follows; right subcostal 8-mm robotic port, right paramedian suprapubic 8-mm robotic port approximately 1 handbreadth superior to the pubic ramus, right far lateral 8-mm robotic port such that it entered well medial to the colon and adhesions.  A 12-mm assistant port in the midline 3 fingerbreadths above the camera port and a 12-mm assistant port in the midline 3 fingerbreadths below the camera port, as well as a 5-mm liver retraction port in the subcostal location.  Self-retaining  grasper was used to place the liver in gentle superior traction banking into posterior side wall, which abutted the liver superiorly.  Robot was docked and passed the electronic checks.  Attention was directed to adhesiolysis.  Extensive adhesiolysis was carefully performed, releasing dense omental attachments to the anterior and right lateral sidewall.  Towards the area of the cecum, the adhesions became more dense raising  suspicion to prior appendicitis, however, there is clearly no current appendicitis noted.  After very careful adhesiolysis for approximately 30 minutes, attention was directed at right radical nephrectomy.  After carefully incising the adhesiolysis area and no visceral or bowel injury was noted, incision was made lateral to the ascending colon from the area of the cecum towards the area of the hepatic flexure.  The colon was carefully swept medially.  The lower pole of the kidney was found and placed on gentle lateral traction.  Dissection proceeded medial to this. Psoas muscle was identified as was the right gonadal vein and right ureter.  These were also placed on very gentle lateral traction. Dissection proceeded superiorly within this triangle towards the area of the renal hilum.  The area of the insertion of the right gonadal vein was seen into the inferior vena cava and gonadal was divided at this using cold clips x2 each side.  At the level of the dissection, the duodenum was encountered and was found to be intimately associated with desmoplastic reaction, especially involving the anterior surface of the renal mass.  There was a plane between this and this area was carefully released from the anterior surface of the mass taking great care to avoid duodenal injury and this did not occur.  Having reflected the duodenum, the kidney was placed on additional lateral traction. Attention was directed at identification of renal hilum.  The inferior vena cava was found just at subhepatic location and the space between the right lateral edge of this and the kidney was carefully developed in superior to inferior orientation as was the plane just lateral to the inferior vena cava above the insertion of the right gonadal.  Again, there was significant desmoplasia in this area and very deliberate and careful dissection was performed.  There was noted to be 3 artery, 2 vein right renovascular  anatomy, one lower pole artery and vein, and 1 very early branching superior artery and 1 vein.  The inferior most portions of this were controlled using endovascular stapler, separate loads for the artery and vein, inferiorly.  Superiorly, the more dominant early bifurcating renal artery was controlled by placing a cold clip proximally just after the bifurcation and endovascular load distal to this which revealed excellent control of the dominant artery.  The right superior renal vein was controlled using endovascular stapler. Dissection then proceeded superiorly thus separating adrenal attachments from the inferior vena cava just to keep it with the specimen. Attention was then directed inferiorly and the ureter was doubly clamped and ligated.  The lower pole of kidney and lateral attachments were then carefully taken down, this allowed the kidney to be placed on inferior stretch to allow better identification and development of the plane between the liver and the upper pole.  This was carefully developed again keeping the adrenal with the specimen.  Additional vascular stapler was applied separating the superior medial adrenal attachments, which revealed excellent hemostasis here.  This completely freed up the right radical nephrectomy specimen, which was quite large.  The area of the liver was carefully inspected and no injury had  occurred.  The gallbladder was identified and also uninjured.  The ascending colon was carefully inspected and uninjured as was the duodenum.  Hemostasis appeared excellent.  The right radical nephrectomy specimen again including right adrenal was placed into an extra large EndoCatch bag and this was able to hold approximately 75% of the specimen, this was partially closed at this point leaving the string coming through the inferior assistant port site.  Robot was then undocked.  The specimen was then retrieved by extending the 2 assistant port sites in  the midline just at the converge and removing the right radical nephrectomy specimen in this retrieval bag, entirety set aside for permanent pathology.  The previous camera port site, which was 12 mm in size was closed at the level of the fascia using figure-of-eight 2-0 Vicryl.  The retrieval site was closed by first placing omentum over the area and closing the level of fascia using figure-of-eight PDS followed by Scarpa, reapproximated with running Vicryl.  All incisions were infiltrated with dilute lyophilized Marcaine and closed at the level of skin using subcuticular Monocryl, Dermabond.  Procedure was then terminated.  The patient tolerated the procedure well.  There were no immediate peri-procedural complications and the patient was taken to the postanesthesia care unit in stable condition.          ______________________________ Alexis Frock, MD     TM/MEDQ  D:  10/19/2013  T:  10/20/2013  Job:  [id number]

## 2013-10-20 NOTE — Progress Notes (Signed)
Still sore Belly soft Vitals OK Advance diet and d/c home tomorrow

## 2013-10-21 NOTE — Discharge Summary (Signed)
Physician Discharge Summary  Patient ID: Gilbert [name] MRN: [id number] DOB/AGE: [birth year] 63 y.o.  Admit date: 10/19/2013 Discharge date: 10/21/2013  Admission Diagnoses: Right renal mass  Discharge Diagnoses:  Active Problems:   Renal cancer   Discharged Condition: good  Hospital Course: Pt admitted s/p PROCEDURE:  1. Robotic-assisted laparoscopic radical nephrectomy.  2. Extensive adhesiolysis. He has done well. POD#2 - he is ambulating, pain controlled, tolerating regular diet, passing flatus.   Consults: None  Significant Diagnostic Studies: none  Treatments: surgery: PROCEDURE:  1. Robotic-assisted laparoscopic radical nephrectomy; RIGHT 2. Extensive adhesiolysis.   Discharge Exam: Blood pressure 172/83, pulse 82, temperature 98.5 F (36.9 C), temperature source Oral, resp. rate 18, height 5\' 10"  (1.778 m), weight 80.8 kg (178 lb 2.1 oz), SpO2 92.00%. NAD Lying in bed watching TV CV - RRR Lungs - CTAB abd- soft, NT, ND, good BS, incisions C/D/I without E/E/I Ext - no calf pain or tenderness  Disposition: 01-Home or Self Care   Future Appointments Provider Department Dept Phone   11/20/2013 10:00 AM Gwendolyn [place] [phone]   11/20/2013 10:30 AM Volanda Napoleon, MD [place] [phone]       Medication List         docusate sodium 100 MG capsule  Commonly known as:  COLACE  Take 1 capsule (100 mg total) by mouth 2 (two) times daily.     HYDROcodone-acetaminophen 5-325 MG per tablet  Commonly known as:  NORCO  Take 1-2 tablets by mouth every 6 (six) hours as needed.     lansoprazole 30 MG capsule  Commonly known as:  PREVACID  Take 30 mg by mouth daily at 12 noon.     lisinopril 20 MG tablet  Commonly known as:  PRINIVIL,ZESTRIL  Take 20 mg by mouth every morning.     metoprolol tartrate 25 MG tablet  Commonly known as:  LOPRESSOR  Take 25 mg by mouth 2 (two) times daily.      phenytoin 300 MG ER capsule  Commonly known as:  DILANTIN  Take 300 mg by mouth every morning.     pravastatin 40 MG tablet  Commonly known as:  PRAVACHOL  Take 40 mg by mouth every morning.     predniSONE 10 MG tablet  Commonly known as:  DELTASONE  Take 4 tablets daily for one full week then 3 tablets a day for 3 days 2 tablets a day for 3 days one tablet a day for 3 days and     sucralfate 1 G tablet  Commonly known as:  CARAFATE  Take 1 tablet (1 g total) by mouth 4 (four) times daily -  with meals and at bedtime.           Follow-up Information   Follow up with Alexis Frock, MD On 11/06/2013. (at 2:30)    Specialty:  Urology   Contact information:   [facility]  [place] [address] [phone]       Signed: Fredricka Bonine 10/21/2013, 1:03 PM

## 2013-10-21 NOTE — Anesthesia Postprocedure Evaluation (Signed)
  Anesthesia Post-op Note  Patient: Gilbert [name]  Procedure(s) Performed: Procedure(s) (LRB): ROBOTIC ASSISTED LAPAROSCOPIC NEPHRECTOMY,  EXTENSIVE ADHESIOLYSIS (Right)  Patient Location: PACU  Anesthesia Type: General  Level of Consciousness: awake and alert   Airway and Oxygen Therapy: Patient Spontanous Breathing  Post-op Pain: mild  Post-op Assessment: Post-op Vital signs reviewed, Patient's Cardiovascular Status Stable, Respiratory Function Stable, Patent Airway and No signs of Nausea or vomiting  Last Vitals:  Filed Vitals:   10/21/13 0528  BP: 172/83  Pulse: 82  Temp: 36.9 C  Resp: 18    Post-op Vital Signs: stable   Complications: No apparent anesthesia complications

## 2013-10-22 ENCOUNTER — Encounter (HOSPITAL_COMMUNITY): Payer: Self-pay | Admitting: Urology

## 2013-11-04 ENCOUNTER — Ambulatory Visit (INDEPENDENT_AMBULATORY_CARE_PROVIDER_SITE_OTHER): Payer: BC Managed Care – PPO | Admitting: Emergency Medicine

## 2013-11-04 VITALS — BP 120/50 | HR 68 | Temp 97.4°F | Resp 16 | Ht 69.0 in | Wt 174.0 lb

## 2013-11-04 DIAGNOSIS — R634 Abnormal weight loss: Secondary | ICD-10-CM

## 2013-11-04 DIAGNOSIS — M109 Gout, unspecified: Secondary | ICD-10-CM

## 2013-11-04 DIAGNOSIS — N289 Disorder of kidney and ureter, unspecified: Secondary | ICD-10-CM

## 2013-11-04 DIAGNOSIS — N2889 Other specified disorders of kidney and ureter: Secondary | ICD-10-CM

## 2013-11-04 MED ORDER — METOPROLOL TARTRATE 25 MG PO TABS: 25.0000 mg | ORAL_TABLET | Freq: Two times a day (BID) | ORAL | Status: DC

## 2013-11-04 MED ORDER — PROBENECID 500 MG PO TABS: 500.0000 mg | ORAL_TABLET | Freq: Every day | ORAL | Status: DC

## 2013-11-04 NOTE — Progress Notes (Signed)
Subjective:    Patient ID: Gilbert [name], male    DOB: 15-Aug-1950, 63 y.o.   MRN: [id number]  Chief Complaint  Patient presents with   Foot Swelling    and feet pain   This chart was scribed for [name], MD by Zettie Pho, ED Scribe.   HPI Gilbert [name] is a 63 y.o. Male with a history of gout who presents to [facility] complaining of a constant pain with associated swelling to the bilateral feet. He states his current symptoms feel similar to his prior gout. Patient has a history of renal cancer with surgical resection about 2 weeks ago. Patient reports that he has a follow up appointment with Dr. Tammi Klippel in 2 days, on 10/06/2013. He denies appetite change, nausea, or emesis since the surgery. Patient also has a history of HTN and hypercholesterolemia.   Patient Active Problem List   Diagnosis Date Noted   Renal cancer 10/19/2013   Renal mass, right 10/10/2013   Coronary artery calcification 09/25/2013   Renal cyst 08/30/2013   Loss of weight 08/03/2013   Past Medical History  Diagnosis Date   Gout    GERD (gastroesophageal reflux disease)    Hypertension    Hypercholesteremia    Cancer     right kidney cancer   Shortness of breath     with activity   Epilepsy     at least 10 years ago    Family history of anesthesia complication     mother-had stroke during anesthesia   Current Outpatient Prescriptions on File Prior to Visit  Medication Sig Dispense Refill   docusate sodium (COLACE) 100 MG capsule Take 1 capsule (100 mg total) by mouth 2 (two) times daily.  10 capsule  0   lansoprazole (PREVACID) 30 MG capsule Take 30 mg by mouth daily at 12 noon.       lisinopril (PRINIVIL,ZESTRIL) 20 MG tablet Take 20 mg by mouth every morning.       metoprolol tartrate (LOPRESSOR) 25 MG tablet Take 25 mg by mouth 2 (two) times daily.        phenytoin (DILANTIN) 300 MG ER capsule Take 300 mg by mouth every morning.        pravastatin (PRAVACHOL)  40 MG tablet Take 40 mg by mouth every morning.        predniSONE (DELTASONE) 10 MG tablet Take 4 tablets daily for one full week then 3 tablets a day for 3 days 2 tablets a day for 3 days one tablet a day for 3 days and  46 tablet  0   sucralfate (CARAFATE) 1 G tablet Take 1 tablet (1 g total) by mouth 4 (four) times daily -  with meals and at bedtime.  120 tablet  0   HYDROcodone-acetaminophen (NORCO) 5-325 MG per tablet Take 1-2 tablets by mouth every 6 (six) hours as needed.  30 tablet  0   No current facility-administered medications on file prior to visit.   No Known Allergies  Review of Systems  Constitutional: Negative for appetite change.  Gastrointestinal: Negative for nausea and vomiting.  Musculoskeletal: Positive for arthralgias and joint swelling.      Objective:   Physical Exam  CONSTITUTIONAL: Well developed/well nourished HEAD: Normocephalic/atraumatic EYES: EOMI/PERRL ENMT: Mucous membranes moist NECK: supple no meningeal signs SPINE:entire spine nontender CV: S1/S2 noted, no murmurs/rubs/gallops noted LUNGS: Lungs are clear to auscultation bilaterally, no apparent distress ABDOMEN: soft, nontender, no rebound or guarding; healing surgical scars  to the periumbilical region of the abdomen without tenderness to palpation or surrounding erythema GU:no cva tenderness NEURO: Pt is awake/alert, moves all extremitiesx4 EXTREMITIES: pulses normal, full ROM; tenderness over the distal 1st metatarsal of the left great toe; tenderness at the bases of the right 2nd and 3rd toes  SKIN: warm, color normal PSYCH: no abnormalities of mood noted  BP 120/50   Pulse 68   Temp(Src) 97.4 F (36.3 C) (Oral)   Resp 16   Ht 5\' 9"  (1.753 m)   Wt 174 lb (78.926 kg)   BMI 25.68 kg/m2   SpO2 99%     Assessment & Plan:  9:20 AM- Will start patient on Benemid  once daily to manage symptoms. Advised patient to avoid certain foods that can exacerbate his gout. Discussed treatment plan with  patient at bedside and patient verbalized agreement.  I personally performed the services described in this documentation, which was scribed in my presence. The recorded information has been reviewed and is accurate.

## 2013-11-07 ENCOUNTER — Encounter: Payer: Self-pay | Admitting: Emergency Medicine

## 2013-11-13 ENCOUNTER — Telehealth: Payer: Self-pay

## 2013-11-13 NOTE — Telephone Encounter (Signed)
Pt requesting refills for his seizure meds and his chol meds/states Dr Everlene Farrier will refill meds since he has taken over pts care   Best phone for pt is [place]

## 2013-11-14 MED ORDER — PHENYTOIN SODIUM EXTENDED 300 MG PO CAPS: 300.0000 mg | ORAL_CAPSULE | Freq: Every morning | ORAL | Status: DC

## 2013-11-14 MED ORDER — LISINOPRIL 20 MG PO TABS: 20.0000 mg | ORAL_TABLET | Freq: Every morning | ORAL | Status: DC

## 2013-11-14 MED ORDER — METOPROLOL TARTRATE 25 MG PO TABS: 25.0000 mg | ORAL_TABLET | Freq: Two times a day (BID) | ORAL | Status: DC

## 2013-11-14 MED ORDER — PRAVASTATIN SODIUM 40 MG PO TABS: 40.0000 mg | ORAL_TABLET | Freq: Every morning | ORAL | Status: DC

## 2013-11-14 NOTE — Telephone Encounter (Signed)
Pended medications.  No notes in his last OV to refill these medications. Please advise.

## 2013-11-14 NOTE — Telephone Encounter (Signed)
It is okay to refill all of these medications I have taken over his care

## 2013-11-14 NOTE — Telephone Encounter (Signed)
Dr. Everlene Farrier is listed as this patient's PCP.  Is that correct?

## 2013-11-20 ENCOUNTER — Ambulatory Visit (HOSPITAL_BASED_OUTPATIENT_CLINIC_OR_DEPARTMENT_OTHER): Payer: BC Managed Care – PPO | Admitting: Hematology & Oncology

## 2013-11-20 ENCOUNTER — Other Ambulatory Visit (HOSPITAL_BASED_OUTPATIENT_CLINIC_OR_DEPARTMENT_OTHER): Payer: BC Managed Care – PPO | Admitting: Lab

## 2013-11-20 ENCOUNTER — Encounter: Payer: Self-pay | Admitting: Hematology & Oncology

## 2013-11-20 VITALS — BP 127/73 | HR 63 | Temp 97.6°F | Resp 18 | Ht 68.0 in | Wt 176.0 lb

## 2013-11-20 DIAGNOSIS — D751 Secondary polycythemia: Secondary | ICD-10-CM

## 2013-11-20 DIAGNOSIS — C649 Malignant neoplasm of unspecified kidney, except renal pelvis: Secondary | ICD-10-CM

## 2013-11-20 DIAGNOSIS — R634 Abnormal weight loss: Secondary | ICD-10-CM

## 2013-11-20 DIAGNOSIS — N281 Cyst of kidney, acquired: Secondary | ICD-10-CM

## 2013-11-20 LAB — CMP (CANCER CENTER ONLY)
ALBUMIN: 3.3 g/dL (ref 3.3–5.5)
ALK PHOS: 154 U/L — AB (ref 26–84)
ALT(SGPT): 14 U/L (ref 10–47)
AST: 20 U/L (ref 11–38)
BUN, Bld: 21 mg/dL (ref 7–22)
CALCIUM: 9.4 mg/dL (ref 8.0–10.3)
CHLORIDE: 104 meq/L (ref 98–108)
CO2: 32 meq/L (ref 18–33)
Creat: 1.3 mg/dl — ABNORMAL HIGH (ref 0.6–1.2)
GLUCOSE: 78 mg/dL (ref 73–118)
POTASSIUM: 4.6 meq/L (ref 3.3–4.7)
Sodium: 144 mEq/L (ref 128–145)
Total Bilirubin: 0.5 mg/dl (ref 0.20–1.60)
Total Protein: 7.5 g/dL (ref 6.4–8.1)

## 2013-11-20 LAB — CBC WITH DIFFERENTIAL (CANCER CENTER ONLY)
BASO#: 0.1 10*3/uL (ref 0.0–0.2)
BASO%: 0.5 % (ref 0.0–2.0)
EOS ABS: 2.8 10*3/uL — AB (ref 0.0–0.5)
EOS%: 22.8 % — ABNORMAL HIGH (ref 0.0–7.0)
HCT: 39 % (ref 38.7–49.9)
HEMOGLOBIN: 13.3 g/dL (ref 13.0–17.1)
LYMPH#: 2.7 10*3/uL (ref 0.9–3.3)
LYMPH%: 22.1 % (ref 14.0–48.0)
MCH: 28.5 pg (ref 28.0–33.4)
MCHC: 34.1 g/dL (ref 32.0–35.9)
MCV: 84 fL (ref 82–98)
MONO#: 0.9 10*3/uL (ref 0.1–0.9)
MONO%: 7.3 % (ref 0.0–13.0)
NEUT%: 47.3 % (ref 40.0–80.0)
NEUTROS ABS: 5.7 10*3/uL (ref 1.5–6.5)
Platelets: 196 10*3/uL (ref 145–400)
RBC: 4.67 10*6/uL (ref 4.20–5.70)
RDW: 14.1 % (ref 11.1–15.7)
WBC: 12.1 10*3/uL — ABNORMAL HIGH (ref 4.0–10.0)

## 2013-11-20 LAB — LACTATE DEHYDROGENASE: LDH: 179 U/L (ref 94–250)

## 2013-11-20 NOTE — Progress Notes (Signed)
Hematology and Oncology Follow Up Visit  Hutch Rhett [id number] 12-01-1950 63 y.o. 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  Caradine is back for followup. Mr. Carbonneau did go for his surgery. A laparoscopic nephrectomy for the right kidney back in March 20th was performed. This [place] get through this without any problems. The pathology report shows that he has a stage III tumor. The tumor extended into the renal vein and into the perirenal fat. No lymph nodes were noted. No disease was noted outside the kidney.  His recovery has been unremarkable. Is back to work part time right now. He's had no abdominal pain. He's had no nausea vomiting. Go to the bathroom okay. He's had no hematuria. There's been no cough shortness of breath. He still smoking a little bit.  Overall, his her performance status is he can't 1.  Medications: Current outpatient prescriptions:docusate sodium (COLACE) 100 MG capsule, Take 1 capsule (100 mg total) by mouth 2 (two) times daily., Disp: 10 capsule, Rfl: 0;  lansoprazole (PREVACID) 30 MG capsule, Take 30 mg by mouth daily at 12 noon., Disp: , Rfl: ;  lisinopril (PRINIVIL,ZESTRIL) 20 MG tablet, Take 1 tablet (20 mg total) by mouth every morning., Disp: 30 tablet, Rfl: 6 metoprolol tartrate (LOPRESSOR) 25 MG tablet, Take 1 tablet (25 mg total) by mouth 2 (two) times daily., Disp: 60 tablet, Rfl: 11;  phenytoin (DILANTIN) 300 MG ER capsule, Take 1 capsule (300 mg total) by mouth every morning., Disp: 30 capsule, Rfl: 6;  pravastatin (PRAVACHOL) 40 MG tablet, Take 1 tablet (40 mg total) by mouth every morning., Disp: 30 tablet, Rfl: 6 probenecid (BENEMID) 500 MG tablet, Take 1 tablet (500 mg total) by mouth daily., Disp: 30 tablet, Rfl: 11  Allergies: No Known Allergies  Past Medical History, Surgical history, Social history, and Family History were  reviewed and updated.  Review of Systems: As above  Physical Exam:  height is 5\' 8"  (1.727 m) and weight is 176 lb (79.833 kg). His oral temperature is 97.6 F (36.4 C). His blood pressure is 127/73 and his pulse is 63. His respiration is 18.   Well-developed well-nourished white male in no obvious distress. His lungs are clear. Cardiac exam regular in rhythm with no murmurs rubs or bruits. Abdomen is soft. His laparoscopy scars that are well healed. She has no fluid wave. There is no guarding or rebound tenderness there is no obvious abdominal mass. There is no palpable liver or spleen tip. Back exam shows no tenderness over the spine, ribs, or hips. Extremities shows no clubbing cyanosis or edema. He has good range of motion of his joints. His good muscle strength. Skin exam no rashes. Neurological exam shows no focal neurological deficits.  Lab Results  Component Value Date   WBC 12.1* 11/20/2013   HGB 13.3 11/20/2013   HCT 39.0 11/20/2013   MCV 84 11/20/2013   PLT 196 11/20/2013     Chemistry      Component Value Date/Time   NA 144 11/20/2013 1016   NA 136* 10/20/2013 0433   K 4.6 11/20/2013 1016   K 4.2 10/20/2013 0433   CL 104 11/20/2013 1016   CL 98 10/20/2013 0433   CO2 32 11/20/2013 1016   CO2 27 10/20/2013 0433   BUN 21 11/20/2013 1016   BUN 15 10/20/2013 0433   CREATININE  1.3* 11/20/2013 1016   CREATININE 1.04 10/20/2013 0433      Component Value Date/Time   CALCIUM 9.4 11/20/2013 1016   CALCIUM 9.2 10/20/2013 0433   ALKPHOS 154* 11/20/2013 1016   ALKPHOS 120* 09/25/2013 1441   AST 20 11/20/2013 1016   AST 12 09/25/2013 1441   ALT 14 11/20/2013 1016   ALT 10 09/25/2013 1441   BILITOT 0.50 11/20/2013 1016   BILITOT 0.3 09/25/2013 1441         Impression and Plan: Mr. Caster is a very nice 64 year old gentleman with a stage III renal cell carcinoma of the right kidney. He underwent nephrectomy.  He does have a risk of recurrence. I think his risk of recurrence probably is going to  be about 35-40%.  We need to follow him with CT scans. Upon will follow him every 3-4 months for the first year. I would then follow him every 6 months after that.  I spoke to he and his daughter. They understand well.  We will go ahead and plan for his first CT scan to be done probably after Memorial Day.  I spent a good half hour or so with him today.  Volanda Napoleon, MD 4/21/201510:55 AM

## 2013-11-26 ENCOUNTER — Inpatient Hospital Stay (HOSPITAL_COMMUNITY)
Admission: EM | Admit: 2013-11-26 | Discharge: 2013-12-07 | DRG: 216 | Disposition: A | Discharge status: Rehabilitation Facility | Payer: BC Managed Care – PPO | Attending: Cardiology | Admitting: Cardiology

## 2013-11-26 ENCOUNTER — Other Ambulatory Visit: Payer: Self-pay

## 2013-11-26 ENCOUNTER — Emergency Department (HOSPITAL_COMMUNITY): Payer: BC Managed Care – PPO

## 2013-11-26 DIAGNOSIS — K7200 Acute and subacute hepatic failure without coma: Secondary | ICD-10-CM | POA: Diagnosis present

## 2013-11-26 DIAGNOSIS — R570 Cardiogenic shock: Secondary | ICD-10-CM | POA: Diagnosis present

## 2013-11-26 DIAGNOSIS — R5381 Other malaise: Secondary | ICD-10-CM | POA: Diagnosis not present

## 2013-11-26 DIAGNOSIS — Z8674 Personal history of sudden cardiac arrest: Secondary | ICD-10-CM

## 2013-11-26 DIAGNOSIS — I472 Ventricular tachycardia, unspecified: Secondary | ICD-10-CM | POA: Diagnosis present

## 2013-11-26 DIAGNOSIS — Z85528 Personal history of other malignant neoplasm of kidney: Secondary | ICD-10-CM

## 2013-11-26 DIAGNOSIS — D649 Anemia, unspecified: Secondary | ICD-10-CM | POA: Diagnosis present

## 2013-11-26 DIAGNOSIS — J9601 Acute respiratory failure with hypoxia: Secondary | ICD-10-CM

## 2013-11-26 DIAGNOSIS — I214 Non-ST elevation (NSTEMI) myocardial infarction: Secondary | ICD-10-CM | POA: Diagnosis present

## 2013-11-26 DIAGNOSIS — I2510 Atherosclerotic heart disease of native coronary artery without angina pectoris: Secondary | ICD-10-CM

## 2013-11-26 DIAGNOSIS — E41 Nutritional marasmus: Secondary | ICD-10-CM | POA: Diagnosis present

## 2013-11-26 DIAGNOSIS — J9600 Acute respiratory failure, unspecified whether with hypoxia or hypercapnia: Secondary | ICD-10-CM | POA: Diagnosis present

## 2013-11-26 DIAGNOSIS — G40909 Epilepsy, unspecified, not intractable, without status epilepticus: Secondary | ICD-10-CM | POA: Diagnosis present

## 2013-11-26 DIAGNOSIS — I469 Cardiac arrest, cause unspecified: Secondary | ICD-10-CM | POA: Diagnosis present

## 2013-11-26 DIAGNOSIS — E872 Acidosis, unspecified: Secondary | ICD-10-CM | POA: Diagnosis present

## 2013-11-26 DIAGNOSIS — R579 Shock, unspecified: Secondary | ICD-10-CM

## 2013-11-26 DIAGNOSIS — I10 Essential (primary) hypertension: Secondary | ICD-10-CM

## 2013-11-26 DIAGNOSIS — I4510 Unspecified right bundle-branch block: Secondary | ICD-10-CM | POA: Diagnosis present

## 2013-11-26 DIAGNOSIS — G931 Anoxic brain damage, not elsewhere classified: Secondary | ICD-10-CM | POA: Diagnosis present

## 2013-11-26 DIAGNOSIS — I4901 Ventricular fibrillation: Principal | ICD-10-CM | POA: Diagnosis present

## 2013-11-26 DIAGNOSIS — I2584 Coronary atherosclerosis due to calcified coronary lesion: Secondary | ICD-10-CM | POA: Diagnosis present

## 2013-11-26 DIAGNOSIS — J690 Pneumonitis due to inhalation of food and vomit: Secondary | ICD-10-CM | POA: Diagnosis present

## 2013-11-26 DIAGNOSIS — R627 Adult failure to thrive: Secondary | ICD-10-CM | POA: Diagnosis not present

## 2013-11-26 DIAGNOSIS — T45515A Adverse effect of anticoagulants, initial encounter: Secondary | ICD-10-CM | POA: Diagnosis not present

## 2013-11-26 DIAGNOSIS — I4729 Other ventricular tachycardia: Secondary | ICD-10-CM | POA: Diagnosis present

## 2013-11-26 DIAGNOSIS — R569 Unspecified convulsions: Secondary | ICD-10-CM

## 2013-11-26 DIAGNOSIS — I2589 Other forms of chronic ischemic heart disease: Secondary | ICD-10-CM | POA: Diagnosis present

## 2013-11-26 DIAGNOSIS — N179 Acute kidney failure, unspecified: Secondary | ICD-10-CM | POA: Diagnosis present

## 2013-11-26 DIAGNOSIS — D6959 Other secondary thrombocytopenia: Secondary | ICD-10-CM | POA: Diagnosis not present

## 2013-11-26 DIAGNOSIS — Z905 Acquired absence of kidney: Secondary | ICD-10-CM

## 2013-11-26 DIAGNOSIS — R7309 Other abnormal glucose: Secondary | ICD-10-CM | POA: Diagnosis present

## 2013-11-26 DIAGNOSIS — D684 Acquired coagulation factor deficiency: Secondary | ICD-10-CM | POA: Diagnosis present

## 2013-11-26 DIAGNOSIS — E876 Hypokalemia: Secondary | ICD-10-CM | POA: Diagnosis not present

## 2013-11-26 HISTORY — DX: Unspecified convulsions: R56.9

## 2013-11-26 HISTORY — DX: Personal history of sudden cardiac arrest: Z86.74

## 2013-11-26 HISTORY — DX: Essential (primary) hypertension: I10

## 2013-11-26 MED ORDER — PROPOFOL 10 MG/ML IV EMUL
INTRAVENOUS | Status: AC
  Filled 2013-11-26: qty 100

## 2013-11-26 MED ORDER — SODIUM CHLORIDE 0.9 % IV SOLN
INTRAVENOUS | Status: DC
  Administered 2013-11-27: 20 mL/h via INTRAVENOUS
  Administered 2013-11-29 (×2): via INTRAVENOUS

## 2013-11-26 NOTE — ED Notes (Signed)
Xray done, no changes, Dr. Lita Mains at [facility].

## 2013-11-27 ENCOUNTER — Inpatient Hospital Stay (HOSPITAL_COMMUNITY): Payer: BC Managed Care – PPO

## 2013-11-27 ENCOUNTER — Encounter (HOSPITAL_COMMUNITY)
Admission: EM | Disposition: A | Discharge status: Rehabilitation Facility | Payer: BC Managed Care – PPO | Source: Home / Self Care | Attending: Cardiology

## 2013-11-27 ENCOUNTER — Encounter (HOSPITAL_COMMUNITY): Payer: Self-pay | Admitting: Pulmonary Disease

## 2013-11-27 ENCOUNTER — Emergency Department (HOSPITAL_COMMUNITY): Payer: BC Managed Care – PPO

## 2013-11-27 DIAGNOSIS — I469 Cardiac arrest, cause unspecified: Secondary | ICD-10-CM

## 2013-11-27 DIAGNOSIS — R569 Unspecified convulsions: Secondary | ICD-10-CM

## 2013-11-27 DIAGNOSIS — R579 Shock, unspecified: Secondary | ICD-10-CM

## 2013-11-27 DIAGNOSIS — I10 Essential (primary) hypertension: Secondary | ICD-10-CM

## 2013-11-27 DIAGNOSIS — J9601 Acute respiratory failure with hypoxia: Secondary | ICD-10-CM

## 2013-11-27 DIAGNOSIS — J9600 Acute respiratory failure, unspecified whether with hypoxia or hypercapnia: Secondary | ICD-10-CM

## 2013-11-27 DIAGNOSIS — I517 Cardiomegaly: Secondary | ICD-10-CM

## 2013-11-27 HISTORY — PX: PERCUTANEOUS CORONARY STENT INTERVENTION (PCI-S): SHX5485

## 2013-11-27 HISTORY — PX: LEFT HEART CATHETERIZATION WITH CORONARY ANGIOGRAM: SHX5451

## 2013-11-27 LAB — BASIC METABOLIC PANEL
BUN: 19 mg/dL (ref 6–23)
BUN: 22 mg/dL (ref 6–23)
BUN: 22 mg/dL (ref 6–23)
BUN: 23 mg/dL (ref 6–23)
CHLORIDE: 104 meq/L (ref 96–112)
CHLORIDE: 105 meq/L (ref 96–112)
CHLORIDE: 105 meq/L (ref 96–112)
CO2: 16 meq/L — AB (ref 19–32)
CO2: 13 mEq/L — ABNORMAL LOW (ref 19–32)
CO2: 12 meq/L — AB (ref 19–32)
CO2: 17 mEq/L — ABNORMAL LOW (ref 19–32)
CREATININE: 1.26 mg/dL (ref 0.50–1.35)
CREATININE: 1.65 mg/dL — AB (ref 0.50–1.35)
Calcium: 7.8 mg/dL — ABNORMAL LOW (ref 8.4–10.5)
Calcium: 7.5 mg/dL — ABNORMAL LOW (ref 8.4–10.5)
Calcium: 7.5 mg/dL — ABNORMAL LOW (ref 8.4–10.5)
Calcium: 8.1 mg/dL — ABNORMAL LOW (ref 8.4–10.5)
Chloride: 106 mEq/L (ref 96–112)
Creatinine, Ser: 1.69 mg/dL — ABNORMAL HIGH (ref 0.50–1.35)
Creatinine, Ser: 1.59 mg/dL — ABNORMAL HIGH (ref 0.50–1.35)
GFR calc Af Amer: 49 mL/min — ABNORMAL LOW (ref >90)
GFR calc non Af Amer: 59 mL/min — ABNORMAL LOW (ref >90)
GFR calc non Af Amer: 41 mL/min — ABNORMAL LOW (ref >90)
GFR calc non Af Amer: 43 mL/min — ABNORMAL LOW (ref >90)
GFR calc non Af Amer: 45 mL/min — ABNORMAL LOW (ref >90)
GFR, EST AFRICAN AMERICAN: 68 mL/min — AB (ref >90)
GFR, EST AFRICAN AMERICAN: 48 mL/min — AB (ref >90)
GFR, EST AFRICAN AMERICAN: 52 mL/min — AB (ref >90)
GLUCOSE: 225 mg/dL — AB (ref 70–99)
GLUCOSE: 248 mg/dL — AB (ref 70–99)
Glucose, Bld: 248 mg/dL — ABNORMAL HIGH (ref 70–99)
Glucose, Bld: 224 mg/dL — ABNORMAL HIGH (ref 70–99)
POTASSIUM: 5.2 meq/L (ref 3.7–5.3)
POTASSIUM: 4.1 meq/L (ref 3.7–5.3)
Potassium: 4.1 mEq/L (ref 3.7–5.3)
Potassium: 5.1 mEq/L (ref 3.7–5.3)
SODIUM: 136 meq/L — AB (ref 137–147)
Sodium: 138 mEq/L (ref 137–147)
Sodium: 137 mEq/L (ref 137–147)
Sodium: 138 mEq/L (ref 137–147)

## 2013-11-27 LAB — CBC
HCT: 35.9 % — ABNORMAL LOW (ref 39.0–52.0)
HCT: 35.9 % — ABNORMAL LOW (ref 39.0–52.0)
HCT: 38 % — ABNORMAL LOW (ref 39.0–52.0)
HCT: 38.1 % — ABNORMAL LOW (ref 39.0–52.0)
HEMOGLOBIN: 12.1 g/dL — AB (ref 13.0–17.0)
HEMOGLOBIN: 12.7 g/dL — AB (ref 13.0–17.0)
Hemoglobin: 12.1 g/dL — ABNORMAL LOW (ref 13.0–17.0)
Hemoglobin: 12.9 g/dL — ABNORMAL LOW (ref 13.0–17.0)
MCH: 28.3 pg (ref 26.0–34.0)
MCH: 28.3 pg (ref 26.0–34.0)
MCH: 28.5 pg (ref 26.0–34.0)
MCH: 29.1 pg (ref 26.0–34.0)
MCHC: 33.4 g/dL (ref 30.0–36.0)
MCHC: 33.7 g/dL (ref 30.0–36.0)
MCHC: 33.7 g/dL (ref 30.0–36.0)
MCHC: 33.9 g/dL (ref 30.0–36.0)
MCV: 83.9 fL (ref 78.0–100.0)
MCV: 84.5 fL (ref 78.0–100.0)
MCV: 84.6 fL (ref 78.0–100.0)
MCV: 85.8 fL (ref 78.0–100.0)
PLATELETS: 274 10*3/uL (ref 150–400)
Platelets: 243 10*3/uL (ref 150–400)
Platelets: 251 10*3/uL (ref 150–400)
Platelets: 259 10*3/uL (ref 150–400)
RBC: 4.25 MIL/uL (ref 4.22–5.81)
RBC: 4.28 MIL/uL (ref 4.22–5.81)
RBC: 4.44 MIL/uL (ref 4.22–5.81)
RBC: 4.49 MIL/uL (ref 4.22–5.81)
RDW: 14.2 % (ref 11.5–15.5)
RDW: 14.4 % (ref 11.5–15.5)
RDW: 14.6 % (ref 11.5–15.5)
RDW: 14.9 % (ref 11.5–15.5)
WBC: 25.8 10*3/uL — ABNORMAL HIGH (ref 4.0–10.5)
WBC: 25.9 10*3/uL — AB (ref 4.0–10.5)
WBC: 31.4 10*3/uL — ABNORMAL HIGH (ref 4.0–10.5)
WBC: 47.4 10*3/uL — ABNORMAL HIGH (ref 4.0–10.5)

## 2013-11-27 LAB — COMPREHENSIVE METABOLIC PANEL
ALBUMIN: 3.1 g/dL — AB (ref 3.5–5.2)
ALT: 201 U/L — ABNORMAL HIGH (ref 0–53)
AST: 347 U/L — AB (ref 0–37)
Alkaline Phosphatase: 270 U/L — ABNORMAL HIGH (ref 39–117)
BILIRUBIN TOTAL: 0.4 mg/dL (ref 0.3–1.2)
BUN: 15 mg/dL (ref 6–23)
CALCIUM: 9.4 mg/dL (ref 8.4–10.5)
CO2: 18 mEq/L — ABNORMAL LOW (ref 19–32)
CREATININE: 1.37 mg/dL — AB (ref 0.50–1.35)
Chloride: 100 mEq/L (ref 96–112)
GFR calc Af Amer: 62 mL/min — ABNORMAL LOW (ref >90)
GFR calc non Af Amer: 53 mL/min — ABNORMAL LOW (ref >90)
Glucose, Bld: 299 mg/dL — ABNORMAL HIGH (ref 70–99)
Potassium: 4.8 mEq/L (ref 3.7–5.3)
Sodium: 141 mEq/L (ref 137–147)
Total Protein: 6.7 g/dL (ref 6.0–8.3)

## 2013-11-27 LAB — POCT I-STAT 3, ART BLOOD GAS (G3+)
ACID-BASE DEFICIT: 10 mmol/L — AB (ref 0.0–2.0)
Acid-base deficit: 17 mmol/L — ABNORMAL HIGH (ref 0.0–2.0)
Acid-base deficit: 8 mmol/L — ABNORMAL HIGH (ref 0.0–2.0)
Bicarbonate: 14.2 mEq/L — ABNORMAL LOW (ref 20.0–24.0)
Bicarbonate: 9.3 mEq/L — ABNORMAL LOW (ref 20.0–24.0)
Bicarbonate: 16 mEq/L — ABNORMAL LOW (ref 20.0–24.0)
O2 SAT: 94 %
O2 Saturation: 92 %
O2 Saturation: 89 %
PCO2 ART: 24 mmHg — AB (ref 35.0–45.0)
PCO2 ART: 27.9 mmHg — AB (ref 35.0–45.0)
PH ART: 7.2 — AB (ref 7.350–7.450)
PH ART: 7.367 (ref 7.350–7.450)
PO2 ART: 70 mmHg — AB (ref 80.0–100.0)
Patient temperature: 37.8
Patient temperature: 37.6
TCO2: 15 mmol/L (ref 0–100)
TCO2: 10 mmol/L (ref 0–100)
TCO2: 17 mmol/L (ref 0–100)
pCO2 arterial: 26.3 mmHg — ABNORMAL LOW (ref 35.0–45.0)
pH, Arterial: 7.345 — ABNORMAL LOW (ref 7.350–7.450)
pO2, Arterial: 70 mmHg — ABNORMAL LOW (ref 80.0–100.0)
pO2, Arterial: 70 mmHg — ABNORMAL LOW (ref 80.0–100.0)

## 2013-11-27 LAB — BLOOD GAS, ARTERIAL
ACID-BASE DEFICIT: 10.3 mmol/L — AB (ref 0.0–2.0)
BICARBONATE: 15.5 meq/L — AB (ref 20.0–24.0)
Drawn by: 41308
FIO2: 100 %
O2 Saturation: 94.1 %
PCO2 ART: 36.5 mmHg (ref 35.0–45.0)
PEEP: 5 cmH2O
PO2 ART: 86.5 mmHg (ref 80.0–100.0)
Patient temperature: 98.6
RATE: 16 resp/min
TCO2: 16.6 mmol/L (ref 0–100)
VT: 520 mL
pH, Arterial: 7.251 — ABNORMAL LOW (ref 7.350–7.450)

## 2013-11-27 LAB — PHENYTOIN LEVEL, TOTAL: Phenytoin Lvl: 2.7 ug/mL — ABNORMAL LOW (ref 10.0–20.0)

## 2013-11-27 LAB — TROPONIN I: TROPONIN I: 7.92 ng/mL — AB (ref <0.30)

## 2013-11-27 LAB — PHOSPHORUS
Phosphorus: 3.1 mg/dL (ref 2.3–4.6)
Phosphorus: 4 mg/dL (ref 2.3–4.6)

## 2013-11-27 LAB — LACTIC ACID, PLASMA
LACTIC ACID, VENOUS: 3.3 mmol/L — AB (ref 0.5–2.2)
LACTIC ACID, VENOUS: 8.4 mmol/L — AB (ref 0.5–2.2)
Lactic Acid, Venous: 2.9 mmol/L — ABNORMAL HIGH (ref 0.5–2.2)
Lactic Acid, Venous: 3.6 mmol/L — ABNORMAL HIGH (ref 0.5–2.2)
Lactic Acid, Venous: 4.5 mmol/L — ABNORMAL HIGH (ref 0.5–2.2)

## 2013-11-27 LAB — GLUCOSE, CAPILLARY
GLUCOSE-CAPILLARY: 206 mg/dL — AB (ref 70–99)
GLUCOSE-CAPILLARY: 159 mg/dL — AB (ref 70–99)
GLUCOSE-CAPILLARY: 181 mg/dL — AB (ref 70–99)
Glucose-Capillary: 267 mg/dL — ABNORMAL HIGH (ref 70–99)
Glucose-Capillary: 243 mg/dL — ABNORMAL HIGH (ref 70–99)

## 2013-11-27 LAB — CREATININE, SERUM
CREATININE: 1.38 mg/dL — AB (ref 0.50–1.35)
GFR calc non Af Amer: 53 mL/min — ABNORMAL LOW (ref >90)
GFR, EST AFRICAN AMERICAN: 61 mL/min — AB (ref >90)

## 2013-11-27 LAB — CLOSTRIDIUM DIFFICILE BY PCR: CDIFFPCR: NEGATIVE

## 2013-11-27 LAB — APTT: aPTT: 35 seconds (ref 24–37)

## 2013-11-27 LAB — I-STAT TROPONIN, ED: TROPONIN I, POC: 0.87 ng/mL — AB (ref 0.00–0.08)

## 2013-11-27 LAB — PROTIME-INR
INR: 1.15 (ref 0.00–1.49)
Prothrombin Time: 14.5 seconds (ref 11.6–15.2)

## 2013-11-27 LAB — PROCALCITONIN: PROCALCITONIN: 10.79 ng/mL

## 2013-11-27 LAB — HEPARIN LEVEL (UNFRACTIONATED): Heparin Unfractionated: <0.1 IU/mL — ABNORMAL LOW (ref 0.30–0.70)

## 2013-11-27 LAB — MAGNESIUM: MAGNESIUM: 1.5 mg/dL (ref 1.5–2.5)

## 2013-11-27 LAB — MRSA PCR SCREENING: MRSA BY PCR: NEGATIVE

## 2013-11-27 LAB — TSH: TSH: 0.961 u[IU]/mL (ref 0.350–4.500)

## 2013-11-27 SURGERY — LEFT HEART CATHETERIZATION WITH CORONARY ANGIOGRAM
Anesthesia: LOCAL

## 2013-11-27 MED ORDER — HEPARIN SODIUM (PORCINE) 5000 UNIT/ML IJ SOLN
5000.0000 [IU] | Freq: Three times a day (TID) | INTRAMUSCULAR | Status: DC
  Administered 2013-11-27: 5000 [IU] via SUBCUTANEOUS
  Filled 2013-11-27 (×4): qty 1

## 2013-11-27 MED ORDER — HYDROCORTISONE NA SUCCINATE PF 100 MG IJ SOLR
50.0000 mg | Freq: Four times a day (QID) | INTRAMUSCULAR | Status: DC
  Administered 2013-11-27 – 2013-11-30 (×11): 50 mg via INTRAVENOUS
  Filled 2013-11-27 (×15): qty 1

## 2013-11-27 MED ORDER — ASPIRIN 81 MG PO CHEW
81.0000 mg | CHEWABLE_TABLET | Freq: Every day | ORAL | Status: DC
  Administered 2013-11-28 – 2013-12-07 (×10): 81 mg via ORAL
  Filled 2013-11-27 (×11): qty 1

## 2013-11-27 MED ORDER — SODIUM CHLORIDE 0.9 % IJ SOLN: 3.0000 mL | INTRAMUSCULAR | Status: DC | PRN

## 2013-11-27 MED ORDER — FENTANYL CITRATE 0.05 MG/ML IJ SOLN
100.0000 ug | INTRAMUSCULAR | Status: DC | PRN
  Administered 2013-11-27: 100 ug via INTRAVENOUS
  Administered 2013-11-27: 50 ug via INTRAVENOUS
  Filled 2013-11-27: qty 2

## 2013-11-27 MED ORDER — SODIUM CHLORIDE 0.9 % IV SOLN: 250.0000 mL | INTRAVENOUS | Status: DC | PRN

## 2013-11-27 MED ORDER — FENTANYL CITRATE 0.05 MG/ML IJ SOLN
INTRAMUSCULAR | Status: AC
  Administered 2013-11-27: 50 ug via INTRAVENOUS
  Filled 2013-11-27: qty 2

## 2013-11-27 MED ORDER — NOREPINEPHRINE BITARTRATE 1 MG/ML IJ SOLN
2.0000 ug/min | Freq: Once | INTRAVENOUS | Status: AC
  Administered 2013-11-27: 10 ug/min via INTRAVENOUS
  Filled 2013-11-27: qty 4

## 2013-11-27 MED ORDER — SODIUM CHLORIDE 0.9 % IV SOLN
2.0000 mg/h | INTRAVENOUS | Status: DC
  Administered 2013-11-27 (×2): 2 mg/h via INTRAVENOUS
  Administered 2013-11-27: 4 mg/h via INTRAVENOUS
  Filled 2013-11-27 (×2): qty 10

## 2013-11-27 MED ORDER — EPINEPHRINE HCL 1 MG/ML IJ SOLN: 0.5000 ug/min | INTRAVENOUS | Status: DC

## 2013-11-27 MED ORDER — SODIUM CHLORIDE 0.9 % IV SOLN
INTRAVENOUS | Status: DC
  Administered 2013-11-27 (×2): via INTRAVENOUS

## 2013-11-27 MED ORDER — SODIUM CHLORIDE 0.9 % IV SOLN
1.0000 g | Freq: Once | INTRAVENOUS | Status: AC
  Filled 2013-11-27: qty 10

## 2013-11-27 MED ORDER — CALCIUM CHLORIDE 10 % IV SOLN
INTRAVENOUS | Status: AC
  Filled 2013-11-27: qty 10

## 2013-11-27 MED ORDER — PIPERACILLIN-TAZOBACTAM 3.375 G IVPB 30 MIN
3.3750 g | Freq: Once | INTRAVENOUS | Status: AC
  Administered 2013-11-27: 3.375 g via INTRAVENOUS
  Filled 2013-11-27: qty 50

## 2013-11-27 MED ORDER — INSULIN ASPART 100 UNIT/ML ~~LOC~~ SOLN
0.0000 [IU] | SUBCUTANEOUS | Status: DC
  Administered 2013-11-27 (×3): 3 [IU] via SUBCUTANEOUS
  Administered 2013-11-27 – 2013-11-28 (×3): 5 [IU] via SUBCUTANEOUS
  Administered 2013-11-28 (×3): 2 [IU] via SUBCUTANEOUS
  Administered 2013-11-28: 3 [IU] via SUBCUTANEOUS
  Administered 2013-11-29 – 2013-11-30 (×3): 2 [IU] via SUBCUTANEOUS

## 2013-11-27 MED ORDER — ASPIRIN 325 MG PO TABS
325.0000 mg | ORAL_TABLET | Freq: Every day | ORAL | Status: DC
  Administered 2013-11-27: 325 mg via ORAL
  Filled 2013-11-27: qty 1

## 2013-11-27 MED ORDER — FENTANYL BOLUS VIA INFUSION
50.0000 ug | INTRAVENOUS | Status: DC | PRN
  Administered 2013-11-29 (×3): 100 ug via INTRAVENOUS
  Filled 2013-11-27: qty 100

## 2013-11-27 MED ORDER — BIVALIRUDIN 250 MG IV SOLR
INTRAVENOUS | Status: AC
  Filled 2013-11-27: qty 250

## 2013-11-27 MED ORDER — PANTOPRAZOLE SODIUM 40 MG IV SOLR
40.0000 mg | INTRAVENOUS | Status: DC
  Administered 2013-11-27 – 2013-11-29 (×3): 40 mg via INTRAVENOUS
  Filled 2013-11-27 (×3): qty 40

## 2013-11-27 MED ORDER — HEPARIN (PORCINE) IN NACL 2-0.9 UNIT/ML-% IJ SOLN
INTRAMUSCULAR | Status: AC
  Filled 2013-11-27: qty 1000

## 2013-11-27 MED ORDER — DOPAMINE-DEXTROSE 3.2-5 MG/ML-% IV SOLN
INTRAVENOUS | Status: AC
  Administered 2013-11-27: 800 mg via INTRAVENOUS
  Filled 2013-11-27: qty 250

## 2013-11-27 MED ORDER — SODIUM BICARBONATE 8.4 % IV SOLN
100.0000 meq | Freq: Once | INTRAVENOUS | Status: AC
  Administered 2013-11-27: 100 meq via INTRAVENOUS
  Filled 2013-11-27: qty 100

## 2013-11-27 MED ORDER — CHLORHEXIDINE GLUCONATE 0.12 % MT SOLN
OROMUCOSAL | Status: AC
  Administered 2013-11-27: 03:00:00
  Filled 2013-11-27: qty 15

## 2013-11-27 MED ORDER — LIDOCAINE HCL (PF) 1 % IJ SOLN
INTRAMUSCULAR | Status: AC
  Filled 2013-11-27: qty 30

## 2013-11-27 MED ORDER — PIPERACILLIN-TAZOBACTAM 3.375 G IVPB
3.3750 g | Freq: Three times a day (TID) | INTRAVENOUS | Status: DC
  Administered 2013-11-27 – 2013-12-01 (×12): 3.375 g via INTRAVENOUS
  Filled 2013-11-27 (×14): qty 50

## 2013-11-27 MED ORDER — ACETAMINOPHEN 325 MG PO TABS: 650.0000 mg | ORAL_TABLET | Freq: Four times a day (QID) | ORAL | Status: DC | PRN

## 2013-11-27 MED ORDER — MIDAZOLAM HCL 2 MG/2ML IJ SOLN
2.0000 mg | INTRAMUSCULAR | Status: DC | PRN
  Administered 2013-11-29 (×2): 2 mg via INTRAVENOUS

## 2013-11-27 MED ORDER — TICAGRELOR 90 MG PO TABS
180.0000 mg | ORAL_TABLET | Freq: Once | ORAL | Status: AC
  Administered 2013-11-27: 180 mg via ORAL
  Filled 2013-11-27: qty 2

## 2013-11-27 MED ORDER — SODIUM CHLORIDE 0.9 % IJ SOLN
3.0000 mL | Freq: Two times a day (BID) | INTRAMUSCULAR | Status: DC
  Administered 2013-11-27: 3 mL via INTRAVENOUS

## 2013-11-27 MED ORDER — SODIUM BICARBONATE 8.4 % IV SOLN
INTRAVENOUS | Status: AC
  Filled 2013-11-27: qty 100

## 2013-11-27 MED ORDER — SUCCINYLCHOLINE CHLORIDE 20 MG/ML IJ SOLN
100.0000 mg | Freq: Once | INTRAMUSCULAR | Status: AC
  Administered 2013-11-26: 100 mg via INTRAVENOUS

## 2013-11-27 MED ORDER — NOREPINEPHRINE BITARTRATE 1 MG/ML IJ SOLN
2.0000 ug/min | INTRAVENOUS | Status: DC
  Administered 2013-11-27: 10 ug/min via INTRAVENOUS
  Administered 2013-11-27: 35 ug/min via INTRAVENOUS
  Filled 2013-11-27 (×3): qty 16

## 2013-11-27 MED ORDER — BIOTENE DRY MOUTH MT LIQD
15.0000 mL | Freq: Four times a day (QID) | OROMUCOSAL | Status: DC
  Administered 2013-11-27 – 2013-11-30 (×12): 15 mL via OROMUCOSAL

## 2013-11-27 MED ORDER — CHLORHEXIDINE GLUCONATE 0.12 % MT SOLN
15.0000 mL | Freq: Two times a day (BID) | OROMUCOSAL | Status: DC
  Administered 2013-11-27 – 2013-11-30 (×7): 15 mL via OROMUCOSAL
  Filled 2013-11-27 (×7): qty 15

## 2013-11-27 MED ORDER — TIROFIBAN HCL IV 12.5 MG/250 ML
0.0750 ug/kg/min | INTRAVENOUS | Status: AC
  Administered 2013-11-27: 0.075 ug/kg/min via INTRAVENOUS
  Filled 2013-11-27: qty 250

## 2013-11-27 MED ORDER — EPINEPHRINE HCL 1 MG/ML IJ SOLN
0.5000 ug/min | Freq: Once | INTRAVENOUS | Status: AC
  Administered 2013-11-27: 0.5 ug/min via INTRAVENOUS
  Filled 2013-11-27: qty 1

## 2013-11-27 MED ORDER — HEPARIN SODIUM (PORCINE) 5000 UNIT/ML IJ SOLN
25000.0000 [IU] | INTRAVENOUS | Status: DC
  Administered 2013-11-27: 25000 [IU]
  Filled 2013-11-27: qty 5

## 2013-11-27 MED ORDER — VANCOMYCIN HCL IN DEXTROSE 1-5 GM/200ML-% IV SOLN
1000.0000 mg | Freq: Once | INTRAVENOUS | Status: AC
  Administered 2013-11-27: 1000 mg via INTRAVENOUS
  Filled 2013-11-27: qty 200

## 2013-11-27 MED ORDER — SODIUM CHLORIDE 0.9 % IV SOLN
1000.0000 mg | Freq: Once | INTRAVENOUS | Status: AC
  Administered 2013-11-27: 1000 mg via INTRAVENOUS
  Filled 2013-11-27: qty 20

## 2013-11-27 MED ORDER — VASOPRESSIN 20 UNIT/ML IJ SOLN
0.0300 [IU]/min | INTRAVENOUS | Status: DC
  Administered 2013-11-27: 0.03 [IU]/min via INTRAVENOUS
  Filled 2013-11-27: qty 2.5

## 2013-11-27 MED ORDER — HEPARIN SODIUM (PORCINE) 5000 UNIT/ML IJ SOLN: 25000.0000 [IU] | INTRAVENOUS | Status: DC

## 2013-11-27 MED ORDER — MAGNESIUM SULFATE 4000MG/100ML IJ SOLN
4.0000 g | Freq: Once | INTRAMUSCULAR | Status: AC
Start: 2013-11-27 — End: 2013-11-27
  Administered 2013-11-27: 4 g via INTRAVENOUS
  Filled 2013-11-27: qty 100

## 2013-11-27 MED ORDER — FENTANYL CITRATE 0.05 MG/ML IJ SOLN
50.0000 ug | Freq: Once | INTRAMUSCULAR | Status: AC
  Administered 2013-11-27 (×2): 50 ug via INTRAVENOUS

## 2013-11-27 MED ORDER — TICAGRELOR 90 MG PO TABS
90.0000 mg | ORAL_TABLET | Freq: Two times a day (BID) | ORAL | Status: DC
  Administered 2013-11-28 – 2013-12-07 (×19): 90 mg via ORAL
  Filled 2013-11-27 (×21): qty 1

## 2013-11-27 MED ORDER — NOREPINEPHRINE BITARTRATE 1 MG/ML IJ SOLN: 2.0000 ug/min | INTRAVENOUS | Status: DC

## 2013-11-27 MED ORDER — HEPARIN (PORCINE) IN NACL 100-0.45 UNIT/ML-% IJ SOLN
1250.0000 [IU]/h | INTRAMUSCULAR | Status: DC
  Administered 2013-11-27: 1000 [IU]/h via INTRAVENOUS
  Filled 2013-11-27: qty 250

## 2013-11-27 MED ORDER — MIDAZOLAM HCL 2 MG/2ML IJ SOLN
3.0000 mg | Freq: Once | INTRAMUSCULAR | Status: AC
  Administered 2013-11-26: 3 mg via INTRAVENOUS

## 2013-11-27 MED ORDER — FENTANYL CITRATE 0.05 MG/ML IJ SOLN: 50.0000 ug | Freq: Once | INTRAMUSCULAR | Status: DC

## 2013-11-27 MED ORDER — SODIUM CHLORIDE 0.9 % IV SOLN
0.0000 ug/h | INTRAVENOUS | Status: DC
  Administered 2013-11-27: 50 ug/h via INTRAVENOUS
  Administered 2013-11-28 (×2): 100 ug/h via INTRAVENOUS
  Administered 2013-11-28: 200 ug/h via INTRAVENOUS
  Administered 2013-11-28: 100 ug/h via INTRAVENOUS
  Administered 2013-11-28: 200 ug/h via INTRAVENOUS
  Administered 2013-11-29 (×2): 350 ug/h via INTRAVENOUS
  Administered 2013-11-29: 200 ug/h via INTRAVENOUS
  Administered 2013-11-30: 350 ug/h via INTRAVENOUS
  Filled 2013-11-27 (×8): qty 50

## 2013-11-27 MED ORDER — SODIUM BICARBONATE 8.4 % IV SOLN
INTRAVENOUS | Status: DC
  Administered 2013-11-27 – 2013-11-28 (×2): via INTRAVENOUS
  Filled 2013-11-27 (×4): qty 150

## 2013-11-27 MED ORDER — TIROFIBAN HCL IV 12.5 MG/250 ML
INTRAVENOUS | Status: AC
  Filled 2013-11-27: qty 250

## 2013-11-27 MED ORDER — VITAL AF 1.2 CAL PO LIQD
1000.0000 mL | ORAL | Status: DC
  Filled 2013-11-27 (×9): qty 1000

## 2013-11-27 MED ORDER — ACETAMINOPHEN 160 MG/5ML PO SOLN
650.0000 mg | Freq: Four times a day (QID) | ORAL | Status: DC | PRN
  Administered 2013-11-27: 650 mg
  Filled 2013-11-27: qty 20.3

## 2013-11-27 MED ORDER — SODIUM CHLORIDE 0.9 % IV SOLN
1.0000 mg/h | INTRAVENOUS | Status: DC
  Administered 2013-11-27: 2 mg/h via INTRAVENOUS
  Administered 2013-11-27: 4 mg/h via INTRAVENOUS
  Administered 2013-11-28: 2 mg/h via INTRAVENOUS
  Administered 2013-11-28 (×2): 4 mg/h via INTRAVENOUS
  Administered 2013-11-28: 6 mg/h via INTRAVENOUS
  Administered 2013-11-28: 2 mg/h via INTRAVENOUS
  Administered 2013-11-28 – 2013-11-29 (×2): 6 mg/h via INTRAVENOUS
  Administered 2013-11-29: 7 mg/h via INTRAVENOUS
  Administered 2013-11-29: 6 mg/h via INTRAVENOUS
  Administered 2013-11-29 – 2013-11-30 (×2): 7 mg/h via INTRAVENOUS
  Filled 2013-11-27 (×10): qty 10

## 2013-11-27 MED ORDER — ETOMIDATE 2 MG/ML IV SOLN
20.0000 mg | Freq: Once | INTRAVENOUS | Status: AC
  Administered 2013-11-26: 20 mg via INTRAVENOUS

## 2013-11-27 MED ORDER — VANCOMYCIN HCL IN DEXTROSE 1-5 GM/200ML-% IV SOLN
1000.0000 mg | Freq: Two times a day (BID) | INTRAVENOUS | Status: DC
  Administered 2013-11-27 (×2): 1000 mg via INTRAVENOUS
  Filled 2013-11-27 (×3): qty 200

## 2013-11-27 MED ORDER — SODIUM CHLORIDE 0.9 % IV SOLN
0.2500 mg/kg/h | INTRAVENOUS | Status: AC
  Administered 2013-11-27: 0.25 mg/kg/h via INTRAVENOUS
  Filled 2013-11-27: qty 250

## 2013-11-27 MED ORDER — PHENYTOIN SODIUM 50 MG/ML IJ SOLN
100.0000 mg | Freq: Three times a day (TID) | INTRAMUSCULAR | Status: AC
  Administered 2013-11-27 – 2013-11-30 (×10): 100 mg via INTRAVENOUS
  Filled 2013-11-27 (×11): qty 2

## 2013-11-27 MED ORDER — DOPAMINE-DEXTROSE 3.2-5 MG/ML-% IV SOLN
2.0000 ug/kg/min | INTRAVENOUS | Status: DC
  Administered 2013-11-27: 800 mg via INTRAVENOUS
  Administered 2013-11-28: 8 ug/kg/min via INTRAVENOUS
  Administered 2013-11-29: 7 ug/kg/min via INTRAVENOUS
  Filled 2013-11-27 (×2): qty 250

## 2013-11-27 MED ORDER — DEXTROSE 20 % IV SOLN
25000.0000 [IU] | INTRAVENOUS | Status: DC
  Administered 2013-11-27 – 2013-11-28 (×2): 25000 [IU]
  Filled 2013-11-27 (×3): qty 5

## 2013-11-27 NOTE — ED Notes (Signed)
Pt pulling arms up, flexing with force, unable to scan, 2mg  bolus given via pump, guardrails.

## 2013-11-27 NOTE — Progress Notes (Signed)
Chaplain responded to a code STEMI.  Pt was under care of ED medical team and not available.  Pt has been intubated for breathing support.  Chaplain escorted family to waiting area and provided hospitality while they waited for the doctor.  Following Dr. report, family requested a time of prayer and asked questions about visitation and next steps.  Chaplain provided emotional support through empathetic listening and pastoral presence and a time of prayer.  Chaplain will continue to follow as needed.      11/27/13 0000  Clinical Encounter Type  Visited With Family;Patient not available;Health care provider  Visit Type Initial;ED  Referral From Nurse  Spiritual Encounters  Spiritual Needs Prayer;Emotional  Stress Factors  Patient Stress Factors None identified  Family Stress Factors Family relationships;Health changes;Lack of knowledge   [employer], Chaplain

## 2013-11-27 NOTE — ED Provider Notes (Signed)
CSN: [id number]     Arrival date & time 11/26/13  2340 History   First MD Initiated Contact with Patient 11/26/13 2348     No chief complaint on file.    (Consider location/radiation/quality/duration/timing/severity/associated sxs/prior Treatment) HPI Patient presents via EMS after about found unresponsive by daughter sitting in her recliner. Patient had no evidence of any trauma. Daughter states the patient had vomitus around his mouth and was gurgling. Upon arrival, EMS applied defibrillator pads and shock was advised. Shock was given without spontaneous return of circulation. When paramedics arrived at the scene and patient was in PEA arrest. He was given several doses of epinephrine with return of spontaneous circulation. He is instructed on epinephrine drip became tube was placed. Upon arrival to the emergency department the patient is awake though confused. He is moving all extremities and pulling at [facility] tube. No past medical history on file. No past surgical history on file. No family history on file. History  Substance Use Topics  . Smoking status: Not on file  . Smokeless tobacco: Not on file  . Alcohol Use: Not on file    Review of Systems  Unable to perform ROS: Intubated      Allergies  Review of patient's allergies indicates not on file.  Home Medications   Prior to Admission medications   Not on File   BP 80/57  Pulse 100  Temp(Src) 96.3 F (35.7 C)  Resp 30  Wt 170 lb (77.111 kg)  SpO2 100% Physical Exam  Nursing note and vitals reviewed. Constitutional: He appears well-developed and well-nourished.  Intubated and agitated.  HENT:  Head: Normocephalic and atraumatic.  Mouth/Throat: Oropharynx is clear and moist.  Vomitus around [place] tube.  Eyes:  Right pupil is slightly larger than the left on initial presentation. Both are reactive.  Neck:  Cervical collar in place.  Cardiovascular: Regular rhythm.   Tachycardia  Pulmonary/Chest: No respiratory  distress. He has no wheezes. He has rales.  Bilateral Rales  Abdominal: Soft. Bowel sounds are normal. He exhibits no distension and no mass. There is no tenderness. There is no rebound and no guarding.  Musculoskeletal: Normal range of motion. He exhibits no edema and no tenderness.  No apparent calf swelling or tenderness. No thoracic or lumbar step offs or evidence of trauma.  Neurological: He is alert.  Patient is awake but not following commands. He appears to be moving all his extremities.  Skin: Skin is warm and dry. No rash noted. No erythema.    ED Course  Procedures (including critical care time) Labs Review Labs Reviewed  CBC - Abnormal; Notable for the following:    WBC 47.4 (*)    Hemoglobin 12.9 (*)    HCT 38.1 (*)    All other components within normal limits  COMPREHENSIVE METABOLIC PANEL - Abnormal; Notable for the following:    CO2 18 (*)    Glucose, Bld 299 (*)    Creatinine, Ser 1.37 (*)    Albumin 3.1 (*)    AST 347 (*)    ALT 201 (*)    Alkaline Phosphatase 270 (*)    GFR calc non Af Amer 53 (*)    GFR calc Af Amer 62 (*)    All other components within normal limits  I-STAT TROPOININ, ED - Abnormal; Notable for the following:    Troponin i, poc 0.87 (*)    All other components within normal limits  APTT  PROTIME-INR  CBC  CREATININE, SERUM  LACTIC  ACID, PLASMA    Imaging Review No results found.   EKG Interpretation None      Date: 11/28/2013  Rate: 110  Rhythm: sinus tachycardia  QRS Axis: normal  Intervals: Prolonged QRS  ST/T Wave abnormalities: ST depressions laterally  Conduction Disutrbances:right bundle branch block  Narrative Interpretation:   Old EKG Reviewed: changes noted  CRITICAL CARE Performed by: Julianne Rice Total critical care time: 40 min Critical care time was exclusive of separately billable procedures and treating other patients. Critical care was necessary to treat or prevent imminent or life-threatening  deterioration. Critical care was time spent personally by me on the following activities: development of treatment plan with patient and/or surrogate as well as nursing, discussions with consultants, evaluation of patient's response to treatment, examination of patient, obtaining history from patient or surrogate, ordering and performing treatments and interventions, ordering and review of laboratory studies, ordering and review of radiographic studies, pulse oximetry and re-evaluation of patient's condition.  MDM   Final diagnoses:  None    Patient with a new right bundle branch block. Question lateral ischemia. Discussed with Dr. Burt Knack who reviewed the patient's EKG. Stated that he saw no into indication for emergent catheterization.  Patient's [place] tube was replaced with ET tube. 2 large peripheral lines were placed. Patient was continued on epinephrine drip started by paramedics and leave the head was added for persistent hypotension. Patient also received 1 L normal saline. Chest x-ray with questionable enlarged mediastinum which Zandi be due to positioning. Pulses appear equal in all extremities.  Discussed with critical care and will see the patient at bedside.  Updated the family. They are aware of the patient's critical condition. They stated the patient had steak to eat for dinner and one hour prior to being found unresponsive was complaining of nausea. He had a stress test earlier in the year which was normal.  Patient's cervical collar was removed due to the history that there was no trauma. Patient was found in a recliner and then lowered to the floor.  Julianne Rice, MD 11/28/13 [phone]

## 2013-11-27 NOTE — Procedures (Signed)
Central Venous Catheter Insertion Procedure Note Gilbert [name] [id number] 07-05-1951  Procedure: Insertion of Central Venous Catheter Indications: Assessment of intravascular volume, Drug and/or fluid administration and Frequent blood sampling  Procedure Details Consent: Risks of procedure as well as the alternatives and risks of each were explained to the (patient/caregiver).  Consent for procedure obtained. Time Out: Verified patient identification, verified procedure, site/side was marked, verified correct patient position, special equipment/implants available, medications/allergies/relevent history reviewed, required imaging and test results available.  Performed  Maximum sterile technique was used including antiseptics, cap, gloves, gown, hand hygiene, mask and sheet. Skin prep: Chlorhexidine; local anesthetic administered A antimicrobial bonded/coated triple lumen catheter was placed in the left internal jugular vein using the Seldinger technique.  Evaluation Blood flow good Complications: No apparent complications Patient did tolerate procedure well. Chest X-ray ordered to verify placement.  CXR: normal.  Procedure performed under direct US guidance.   Montey Hora, [facility] Pgr: [phone]  or [phone]

## 2013-11-27 NOTE — Care Management Note (Addendum)
    Page 1 of 1   12/06/2013     4:20:41 PM CARE MANAGEMENT NOTE 12/06/2013  Patient:  Gilbert [name],Gilbert [name]   Account Number:  [id number]  Date Initiated:  11/27/2013  Documentation initiated by:  Elissa Hefty  Subjective/Objective Assessment:   adm w resp arrest-vent     Action/Plan:   lives w fam, pcp dr Remo Lipps daub   Anticipated DC Date:  12/07/2013   Anticipated DC Plan:  IP REHAB FACILITY  In-house referral  Clinical Social Worker      DC Forensic scientist  CM consult  Medication Assistance      Choice offered to / List presented to:             Status of service:  Completed, signed off Medicare Important Message given?   (If response is "NO", the following Medicare IM given date fields will be blank) Date Medicare IM given:   Date Additional Medicare IM given:    Discharge Disposition:  IP REHAB FACILITY  Per UR Regulation:  Reviewed for med. necessity/level of care/duration of stay  If discussed at [place] of Stay Meetings, dates discussed:   12/04/2013  12/06/2013    Comments:   12/06/13 Ellan Lambert, RN, BSN [phone]  [address] [place] originally denied IP rehab stay; MD did peer-to -peer appeal with Merchant navy officer today, and they now are approving rehab stay.  [employer] fitted this afternoon by [employer] rep.  Plan dc to CIR in am, per rehab admissions liasion and MD.  12/04/13 Ellan Lambert, RN, BSN [phone] Rehab c/s in progress; CSW following for SNF backup plan, if needed.  5/4 0919 debbie dowell rn,bsn pt given brilinta 30 day free and copay assist card.for cir eval.

## 2013-11-27 NOTE — ED Notes (Signed)
i-stat Trop. 0.87 ng/mL result given to Dr. Lita Mains

## 2013-11-27 NOTE — Progress Notes (Signed)
Spoke with Dr. Einar Gip regarding change in heart rate 120s now 60s and heart rhythm indicates variances (1st degree, Junctional, A-fib.  EKG ordered will call with results.

## 2013-11-27 NOTE — Progress Notes (Addendum)
PULMONARY / CRITICAL CARE MEDICINE   Name: Gilbert [name] MRN: [id number] DOB: [birth year]    ADMISSION DATE:  11/26/2013 CONSULTATION DATE:  11/27/2013  REFERRING MD : EDP  PRIMARY SERVICE: PCCM   CHIEF COMPLAINT: Post-arrest   BRIEF PATIENT DESCRIPTION: 63 yo with distant history of seizure disorder ( last seizure 10 years ago, on dilantin ) found unresponsive in recliner. VT / VF >>> shock >>> PEA arrest.  SIGNIFICANT EVENTS / STUDIES:  4/27 VT/VF/PEA arrest 4/28 CT head >>> nad 4/28 CTA chest >>> no embolism, bilateral airspace disease 4/28 Echo>>> 4/28 EEG>>>  LINES / TUBES:  OETT 4/27 >>>  OGT 4/27 >>> L IJ CVL 4/28 >>>  RF AL 4/28 >>>  Foley 4/28 >>>   CULTURES:  4/28  Blood  >>>  4/28  Urine  >>>  4/28  Sputum  >>>   ANTIBIOTICS:  Zosyn 4/28 >>>  Vanc 4/28 >>>   INTERVAL HISTORY: RN reports liquid stool.  C.dif PCR sent.  VITAL SIGNS: Temp:  [95.4 F (35.2 C)-98.2 F (36.8 C)] 98.1 F (36.7 C) (04/28 0600) Pulse Rate:  [25-115] 99 (04/28 0600) Resp:  [18-39] 38 (04/28 0600) BP: (80-119)/(30-85) 103/71 mmHg (04/28 0600) SpO2:  [50 %-100 %] 97 % (04/28 0600) Arterial Line BP: (101-134)/(57-80) 122/62 mmHg (04/28 0600) FiO2 (%):  [60 %-100 %] 60 % (04/28 0322) Weight:  [170 lb (77.111 kg)-181 lb (82.1 kg)] 181 lb (82.1 kg) (04/28 0230)  HEMODYNAMICS: CVP:  [11 mmHg] 11 mmHg  VENTILATOR SETTINGS: Vent Mode:  [-] PRVC FiO2 (%):  [60 %-100 %] 60 % Set Rate:  [16 bmp] 16 bmp Vt Set:  [520 mL-600 mL] 520 mL PEEP:  [5 cmH20] 5 cmH20 Plateau Pressure:  [16 cmH20-17 cmH20] 17 cmH20  INTAKE / OUTPUT: Intake/Output     04/27 0701 - 04/28 0700 04/28 0701 - 04/29 0700   I.V. (mL/kg) 1750.7 (21.3)    IV Piggyback 250    Total Intake(mL/kg) 2000.7 (24.4)    Urine (mL/kg/hr) 125    Total Output 125     Net +1875.7          Stool Occurrence 2 x      PHYSICAL EXAMINATION: General:  Appears acutely ill, mechanically ventilated, synchronous Neuro:   Encephalopathic, nonfocal, cough / gag diminished HEENT:  PERRL, OETT / OGT Cardiovascular:  RRR, no m/r/g Lungs:  Bilateral diminished air entry, no w/r/r Abdomen:  Soft, nontender, bowel sounds diminished Musculoskeletal:  Moves all extremities, no edema Skin:  Intact  LABS:  CBC  Recent Labs Lab 11/26/13 2345 11/27/13 0400  WBC 47.4* 25.8*  HGB 12.9* 12.1*  HCT 38.1* 35.9*  PLT 243 251   Coag's  Recent Labs Lab 11/26/13 2345  APTT 35  INR 1.15   BMET  Recent Labs Lab 11/26/13 2345 11/26/13 2348 11/27/13 0400  NA 141  --  138  K 4.8  --  4.1  CL 100  --  104  CO2 18*  --  16*  BUN 15  --  19  CREATININE 1.37* 1.38* 1.26  GLUCOSE 299*  --  248*   Electrolytes  Recent Labs Lab 11/26/13 2345 11/27/13 0400  CALCIUM 9.4 7.8*  MG  --  1.5  PHOS  --  3.1   Sepsis Markers  Recent Labs Lab 11/26/13 2348 11/27/13 0150  LATICACIDVEN 8.4* 3.3*   ABG  Recent Labs Lab 11/27/13 0230  PHART 7.251*  PCO2ART 36.5  PO2ART 86.5   Liver Enzymes  Recent Labs Lab 11/26/13 2345  AST 347*  ALT 201*  ALKPHOS 270*  BILITOT 0.4  ALBUMIN 3.1*   Cardiac Enzymes  Recent Labs Lab 11/27/13 0206  TROPONINI 7.92*   Glucose  Recent Labs Lab 11/27/13 0224 11/27/13 0454  GLUCAP 267* 206*   IMAGING:   Ct Head Wo Contrast  11/27/2013   CLINICAL DATA:  Status post CPR.  Evaluate for anoxic injury.  EXAM: CT HEAD WITHOUT CONTRAST  TECHNIQUE: Contiguous axial images were obtained from the base of the skull through the vertex without intravenous contrast.  COMPARISON:  None.  FINDINGS: There is no evidence of acute infarction, mass lesion, or intra- or extra-axial hemorrhage on CT.  Gray-white differentiation is preserved. There is no definite immediate CT evidence for anoxic brain injury. Mild periventricular white matter change likely reflects small vessel ischemic microangiopathy. Mild cerebellar atrophy is noted.  The brainstem and fourth ventricle are  within normal limits. The basal ganglia are unremarkable in appearance. No mass effect or midline shift is seen.  There is no evidence of fracture; visualized osseous structures are unremarkable in appearance. Mild bilateral proptosis is noted. The visualized portions of the orbits are otherwise unremarkable. The paranasal sinuses and mastoid air cells are well-aerated. No significant soft tissue abnormalities are seen.  IMPRESSION: 1. No acute intracranial pathology seen on CT. 2. Gray-white differentiation is preserved; no definite immediate CT evidence to suggest anoxic brain injury. 3. Mild small vessel ischemic microangiopathy. 4. Mild bilateral proptosis noted.   Electronically Signed   By: Garald Balding M.D.   On: 11/27/2013 01:33   Ct Angio Chest Pe W/cm &/or Wo Cm  11/27/2013   CLINICAL DATA:  Hypoxia.  Respiratory failure.  Status post CPR.  EXAM: CT ANGIOGRAPHY CHEST WITH CONTRAST  TECHNIQUE: Multidetector CT imaging of the chest was performed using the standard protocol during bolus administration of intravenous contrast. Multiplanar CT image reconstructions and MIPs were obtained to evaluate the vascular anatomy.  CONTRAST:  100 mL of Omnipaque 350 IV contrast  COMPARISON:  Chest radiograph performed 11/26/2013  FINDINGS: There is no evidence of pulmonary embolus.  There is partial consolidation of both lower lung lobes, with associated air bronchograms, raising concern for pneumonia. Given recent CPR, this could reflect aspiration. Mild emphysematous change is noted at the upper lung lobes. No pleural effusion or pneumothorax is seen. No masses are identified; no abnormal focal contrast enhancement is seen.  The patient's endotracheal tube is seen ending 7 cm above the carina. An enteric tube is noted extending into the antrum of the stomach. No significant mediastinal lymphadenopathy is seen. No pericardial effusion is identified. The great vessels are grossly unremarkable in appearance. No  axillary lymphadenopathy is seen. The visualized portions of the thyroid gland are unremarkable in appearance.  The visualized portions of the liver and spleen are unremarkable.  No acute osseous abnormalities are seen.  Review of the MIP images confirms the above findings.  IMPRESSION: 1. No evidence of pulmonary embolus. 2. Partial consolidation of both lower lung lobes, with associated air bronchograms, raising concern for pneumonia. Given recent CPR, this could reflect aspiration. 3. Mild emphysematous change at the upper lung lobes. 4. Endotracheal tube seen ending 7 cm above the carina. This could be advanced 4 cm, as deemed clinically appropriate.   Electronically Signed   By: Garald Balding M.D.   On: 11/27/2013 01:48   Dg Chest Port 1 View  11/27/2013   CLINICAL DATA:  Central line placement.  EXAM: PORTABLE CHEST - 1 VIEW  COMPARISON:  Chest radiograph performed 11/26/2013, and CTA of the chest performed earlier today at 1:03 a.m.  FINDINGS: The patient's endotracheal tube is seen ending 6-7 cm above the carina. An enteric tube is noted extending below the diaphragm. A left IJ line is seen ending about the proximal SVC, resting against the wall of the SVC, without definite kinking.  Vascular congestion is noted, with bilateral central airspace opacities. Given the appearance of recent CTA, this Jentsch reflect aspiration pneumonia, though pulmonary edema could have such an appearance. No pleural effusion or pneumothorax is seen.  The cardiomediastinal silhouette is borderline normal in size. On correlation with recent CTA, there appear to be mildly displaced fractures of the right third through eighth anterolateral ribs, and left fourth through seventh anterolateral ribs.  IMPRESSION: 1. Endotracheal tube seen ending 6-7 cm above the carina. 2. Left IJ line seen ending about the proximal SVC, resting against the wall of the SVC, without definite kinking. 3. Enteric tube noted extending below the diaphragm.  4. Vascular congestion, with bilateral central airspace opacities. Given the recent CTA, this Romaniello reflect aspiration pneumonia, though pulmonary edema could have such an appearance. 5. Mildly displaced fractures of the right third through eighth anterolateral ribs, and left fourth through seventh anterolateral ribs, as on recent CTA.   Electronically Signed   By: Garald Balding M.D.   On: 11/27/2013 02:05   Dg Chest Portable 1 View  11/27/2013   CLINICAL DATA:  Status post CPR and intubation  EXAM: PORTABLE CHEST - 1 VIEW  COMPARISON:  No comparisons  FINDINGS: The endotracheal tube tip lies at the level of the clavicular heads which is approximately 7.6 cm above the crotch of the carina. The lungs appear well expanded and clear. The left lateral costophrenic angle is excluded from the study. The cardiopericardial silhouette is top-normal in size. The pulmonary vascularity is not engorged. The esophagogastric tube tip and proximal port project below the inferior margin of the film.  IMPRESSION: The patient has undergone interval intubation of the trachea. The tip of the tube lies 7.6 cm above the crotch of the carina. Advancement by at least 2 cm is recommended. There is no focal infiltrate nor evidence of a pneumothorax or pneumomediastinum. The cardiopericardial silhouette is enlarged without evidence of significant pulmonary vascular congestion.   Electronically Signed   By: David  Martinique   On: 11/27/2013 01:06   ASSESSMENT / PLAN:  PULMONARY  A:  Acute respiratory failure  Aspiration pneumonia / pneumonitis P:  Goal pH>7.30, SpO2>92 Continuous mechanical support VAP bundle Daily SBT Trend ABG/CXR ABG now to confirm resolution of acidosis  CARDIOVASCULAR  A:  VT/VF/PEA arrest NSVT in setting of hypomagnesemia  Shock, likely cardiogenic NSTEMI P:  MAP goal >65 Trend lactate / troponin Levophed gtt Add Vasopressin 2D echo>>>  Add ASA, Heparin gtt BB/ACEI contraindicated -  shock Statin contraindicated - elevated transaminases Interventional cardiology to evaluate  RENAL  A:  AKI Hypomagnesemia  Renal carcinoma s/p nephrectomy  Metabolic acidosis P:  Goal CVP 10-12 Goal K 4, Mg 2 Follow BMP/Mg/Phos Mg 4 g IV x 1 NS@125   GASTROINTESTINAL  A:  Elevated transaminases, likely shocked liver GI Px Nutrition P:  Protonix NPO TF per Nutritionist Trend LFT  HEMATOLOGIC  A:  Heparinization for ACS VTE Px is not indicated P:  Trend CBC  D/c Heparin [place] Heparin gtt  INFECTIOUS  A:  Possible aspiration pneumonia C. Dif ruled out P:  Abx/cx as above  ENDOCRINE  A:  Hyperglycemia Unknown adrenal function P:  SSI Cortisol level  NEUROLOGIC  A:  Acute encephalopathy  H/o seizures, last 10 years ago, on Dilantin, sub therapeutic  P:  No role for therapeutic hypothermia - unknown down time, purposeful movements post-resuscitation Normothermia EEG Dilantin load and maintenance Fentanyl / Versed PRN  Charlann Lange, MD PGY-3 IMTS  11/27/2013, 7:11 AM  Family updated.  "Full code" for now and reassess in 24-48 hours.  I have personally obtained history, examined patient, evaluated and interpreted laboratory and imaging results, reviewed medical records, formulated assessment / plan and placed orders.  CRITICAL CARE:  The patient is critically ill with multiple organ systems failure and requires high complexity decision making for assessment and support, frequent evaluation and titration of therapies, application of advanced monitoring technologies and extensive interpretation of multiple databases. Additional Critical Care Time devoted to patient care services described in this note is 45 minutes.   Doree Fudge, MD [facility] [place] Pager: [phone]  11/27/2013, 8:41 AM

## 2013-11-27 NOTE — Procedures (Signed)
EEG report.  Brief clinical history: 63 year old male with PMH of seizures, HTN, and recent nephrectomy. Found unresponsive in recliner 4/27 with unknown down time, could have been as much as 2 hours. EMS was called. VF/VT at EMS arrival (per NP reports on this were very conflicting if happened or not) which then became PEA arrest. Intubated, ACLS was performed. Reportedly 2 rounds of CPR and epi achieved ROSC    Technique: this is a 17 channel routine scalp EEG performed at the bedside in the ICU setting, with bipolar and monopolar montages arranged in accordance to the international 10/20 system of electrode placement. One channel was dedicated to EKG recording.  Patient is intubated on the vent. Hyperventilation and intermittent photic stimulation were not utilized as activating procedures.  Description: As the study begins and throughout the entirety of the recording, patient displays an EEG pattern that is consistent with normal sleep architecture without evidence of intermixed epileptiform discharges. There is not evidence of electrographic seizures. EKG showed sinus rhythm.  Impression: this is a normal asleep EEG. Importantly, no evidence of electrographic seizures noted. Please, be aware that a normal EEG does not exclude the possibility of epilepsy.  Clinical correlation is advised.  Dorian Pod, MD

## 2013-11-27 NOTE — ED Notes (Signed)
PCCM at [facility]

## 2013-11-27 NOTE — Progress Notes (Signed)
Orthopedic Tech Progress Note Patient Details:  Gilbert [name] 1951-01-10 [id number] rn to apply knee immobilizer Ortho Devices Type of Ortho Device: Knee Immobilizer Ortho Device/Splint Location: LLE Ortho Device/Splint Interventions: Ordered   Braulio Bosch 11/27/2013, 10:28 PM

## 2013-11-27 NOTE — Progress Notes (Signed)
Echocardiogram 2D Echocardiogram has been performed.  Gilbert [name] 11/27/2013, 10:31 PM

## 2013-11-27 NOTE — Progress Notes (Signed)
STAT EEG completed; results pending. 

## 2013-11-27 NOTE — Progress Notes (Signed)
Notified Dr. Einar Gip regarding EKG completion. No further orders, Dr. Einar Gip will examine patient today.

## 2013-11-27 NOTE — ED Notes (Signed)
Post aline and central line  cxr complete, preparing to transport, RT & RRT RN at [facility], no changes, VSS, intermittently restless, reaching. Versed bolus given from bag

## 2013-11-27 NOTE — Consult Note (Addendum)
CARDIOLOGY CONSULT NOTE  Patient ID: Gilbert [name] MRN: [id number] DOB/AGE: 63/22/1952 63 y.o.  Admit date: 11/26/2013 Referring Physician  Emiliano Dyer Primary Physician:  Jenny Reichmann, MD Reason for Consultation  VT/VF arrest  HPI: Patient is a 63 year old Caucasian male who I had seen about a month ago and due to coronary calcification and preoperative cardiovascular stratification, he underwent Lexi scan Myoview stress test on 10/6203, there was no evidence of ischemia, ejection fraction was normal.  Chart review reveals that patient was found unresponsive in his chair for several hours, downtime not known. When the EMS came after activation from the family members, patient was found to be in V. fib/VF, was defibrillated, eventually on presentation to the emergency room, intubated and resuscitated.  Patient had been responsive grossly, hands felt that the hypothermia protocol he does be initiated. He recently underwent echocardiogram this morning, revealing ejection fraction of 20% with wall motion abnormality suggestive of LAD territory ischemia.  Patient is presently intubated. He is sedated.  Past Medical History  Diagnosis Date  . HTN (hypertension)   . Seizures   . Cardiac arrest   . Cancer      History reviewed. No pertinent past surgical history.   No family history on file.   Social History: History   Social History  . Marital Status: Married    Spouse Name: N/A    Number of Children: N/A  . Years of Education: N/A   Occupational History  . Not on file.   Social History Main Topics  . Smoking status: Never Smoker   . Smokeless tobacco: Current User    Types: Chew  . Alcohol Use: Not on file  . Drug Use: Not on file  . Sexual Activity: Not on file   Other Topics Concern  . Not on file   Social History Narrative  . No narrative on file     Prescriptions prior to admission  Medication Sig Dispense Refill  . aspirin EC 81 MG tablet Take 81 mg  by mouth daily.      Marland Kitchen docusate sodium (COLACE) 100 MG capsule Take 100 mg by mouth 2 (two) times daily.      . lansoprazole (PREVACID) 30 MG capsule Take 30 mg by mouth daily.      Marland Kitchen lisinopril (PRINIVIL,ZESTRIL) 20 MG tablet Take 20 mg by mouth at bedtime.      . metoprolol tartrate (LOPRESSOR) 25 MG tablet Take 25 mg by mouth 2 (two) times daily.      . phenytoin (DILANTIN) 100 MG ER capsule Take 300 mg by mouth daily.      . pravastatin (PRAVACHOL) 40 MG tablet Take 40 mg by mouth daily.      . probenecid (BENEMID) 500 MG tablet Take 500 mg by mouth daily.        Scheduled Meds: . antiseptic oral rinse  15 mL Mouth Rinse QID  . aspirin  325 mg Oral Daily  . calcium gluconate  1 g Intravenous Once  . chlorhexidine  15 mL Mouth Rinse BID  . hydrocortisone sodium succinate  50 mg Intravenous 4 times per day  . insulin aspart  0-15 Units Subcutaneous 6 times per day  . pantoprazole (PROTONIX) IV  40 mg Intravenous Q24H  . phenytoin (DILANTIN) IV  100 mg Intravenous 3 times per day  . piperacillin-tazobactam (ZOSYN)  IV  3.375 g Intravenous Q8H  . sodium bicarbonate      . sodium chloride  3 mL Intravenous Q12H  .  vancomycin  1,000 mg Intravenous Q12H   Continuous Infusions: . sodium chloride 20 mL/hr (11/27/13 0139)  . sodium chloride 125 mL/hr at 11/27/13 1653  . feeding supplement (VITAL AF 1.2 CAL)    . fentaNYL infusion INTRAVENOUS 50 mcg/hr (11/27/13 1433)  . heparin 1,000 Units/hr (11/27/13 0915)  . midazolam (VERSED) infusion 2 mg/hr (11/27/13 1720)  . norepinephrine (LEVOPHED) Adult infusion 35 mcg/min (11/27/13 1310)  .  sodium bicarbonate  infusion 1000 mL 150 mL/hr at 11/27/13 1727  . vasopressin (PITRESSIN) infusion - *FOR SHOCK* 0.03 Units/min (11/27/13 1013)   PRN Meds:.sodium chloride, sodium chloride, acetaminophen (TYLENOL) oral liquid 160 mg/5 mL, acetaminophen, fentaNYL, fentaNYL, midazolam, sodium chloride  ROS: Not obtainable.    Physical Exam: Blood  pressure 78/53, pulse 62, temperature 99.1 F (37.3 C), temperature source Core (Comment), resp. rate 33, height 5\' 8"  (1.727 m), weight 82.1 kg (181 lb), SpO2 98.00%.   General appearance: Patient is intubated and on a ventilator. Unresponsive. Lungs: clear to auscultation bilaterally Heart: regular rate and rhythm, S1, S2 normal, no murmur, click, rub or gallop Abdomen: soft, non-tender; bowel sounds normal; no masses,  no organomegaly Extremities: extremities normal, atraumatic, no cyanosis or edema  cold extremity, vascular examination revealed normal carotid, femoral pulse. Pedal pulse could not be felt.  Labs:   Lab Results  Component Value Date   WBC 31.4* 11/27/2013   HGB 12.7* 11/27/2013   HCT 38.0* 11/27/2013   MCV 84.6 11/27/2013   PLT 274 11/27/2013    Recent Labs Lab 11/26/13 2345  11/27/13 1655  NA 141  < > 136*  K 4.8  < > 5.1  CL 100  < > 105  CO2 18*  < > 12*  BUN 15  < > 22  CREATININE 1.37*  < > 1.65*  CALCIUM 9.4  < > 7.5*  PROT 6.7  --   --   BILITOT 0.4  --   --   ALKPHOS 270*  --   --   ALT 201*  --   --   AST 347*  --   --   GLUCOSE 299*  < > 224*  < > = values in this interval not displayed. Lab Results  Component Value Date   TROPONINI >20.00* 11/27/2013    Lipid Panel  No results found for this basename: chol, trig, hdl, cholhdl, vldl, ldlcalc    EKG:  11/27/2013: Normal sinus rhythm at a rate of 63 beats a minute, normal axis, right bundle branch block.(T. inversion V1 to V3 cannot rule out ischemia) Frequent PAC. Nonspecific T. abnormality. Normal QT interval.   Radiology: Ct Head Wo Contrast  11/27/2013   CLINICAL DATA:  Status post CPR.  Evaluate for anoxic injury.  EXAM: CT HEAD WITHOUT CONTRAST  TECHNIQUE: Contiguous axial images were obtained from the base of the skull through the vertex without intravenous contrast.  COMPARISON:  None.  FINDINGS: There is no evidence of acute infarction, mass lesion, or intra- or extra-axial hemorrhage on  CT.  Gray-white differentiation is preserved. There is no definite immediate CT evidence for anoxic brain injury. Mild periventricular white matter change likely reflects small vessel ischemic microangiopathy. Mild cerebellar atrophy is noted.  The brainstem and fourth ventricle are within normal limits. The basal ganglia are unremarkable in appearance. No mass effect or midline shift is seen.  There is no evidence of fracture; visualized osseous structures are unremarkable in appearance. Mild bilateral proptosis is noted. The visualized portions of the orbits are  otherwise unremarkable. The paranasal sinuses and mastoid air cells are well-aerated. No significant soft tissue abnormalities are seen.  IMPRESSION: 1. No acute intracranial pathology seen on CT. 2. Gray-white differentiation is preserved; no definite immediate CT evidence to suggest anoxic brain injury. 3. Mild small vessel ischemic microangiopathy. 4. Mild bilateral proptosis noted.   Electronically Signed   By: Garald Balding M.D.   On: 11/27/2013 01:33   Ct Angio Chest Pe W/cm &/or Wo Cm  11/27/2013   CLINICAL DATA:  Hypoxia.  Respiratory failure.  Status post CPR.  EXAM: CT ANGIOGRAPHY CHEST WITH CONTRAST  TECHNIQUE: Multidetector CT imaging of the chest was performed using the standard protocol during bolus administration of intravenous contrast. Multiplanar CT image reconstructions and MIPs were obtained to evaluate the vascular anatomy.  CONTRAST:  100 mL of Omnipaque 350 IV contrast  COMPARISON:  Chest radiograph performed 11/26/2013  FINDINGS: There is no evidence of pulmonary embolus.  There is partial consolidation of both lower lung lobes, with associated air bronchograms, raising concern for pneumonia. Given recent CPR, this could reflect aspiration. Mild emphysematous change is noted at the upper lung lobes. No pleural effusion or pneumothorax is seen. No masses are identified; no abnormal focal contrast enhancement is seen.  The  patient's endotracheal tube is seen ending 7 cm above the carina. An enteric tube is noted extending into the antrum of the stomach. No significant mediastinal lymphadenopathy is seen. No pericardial effusion is identified. The great vessels are grossly unremarkable in appearance. No axillary lymphadenopathy is seen. The visualized portions of the thyroid gland are unremarkable in appearance.  The visualized portions of the liver and spleen are unremarkable.  No acute osseous abnormalities are seen.  Review of the MIP images confirms the above findings.  IMPRESSION: 1. No evidence of pulmonary embolus. 2. Partial consolidation of both lower lung lobes, with associated air bronchograms, raising concern for pneumonia. Given recent CPR, this could reflect aspiration. 3. Mild emphysematous change at the upper lung lobes. 4. Endotracheal tube seen ending 7 cm above the carina. This could be advanced 4 cm, as deemed clinically appropriate.   Electronically Signed   By: Garald Balding M.D.   On: 11/27/2013 01:48   Dg Chest Port 1 View  11/27/2013   CLINICAL DATA:  Cardiac arrest, intubation/on ventilator, question pneumothorax  EXAM: PORTABLE CHEST - 1 VIEW  COMPARISON:  Portable exam 1637 hr compared to 0137 hr  FINDINGS: Tip of endotracheal tube projects 6.0 cm above carinal.  Nasogastric tube extends into stomach.  LEFT jugular central venous catheter tip projects over SVC.  Normal heart size, mediastinal contours and pulmonary vascularity for technique.  Bibasilar atelectasis.  No definite infiltrate, pleural effusion, or pneumothorax.  No acute osseous findings.  IMPRESSION: Bibasilar atelectasis.   Electronically Signed   By: Lavonia Dana M.D.   On: 11/27/2013 16:56   Dg Chest Port 1 View  11/27/2013   CLINICAL DATA:  Central line placement.  EXAM: PORTABLE CHEST - 1 VIEW  COMPARISON:  Chest radiograph performed 11/26/2013, and CTA of the chest performed earlier today at 1:03 a.m.  FINDINGS: The patient's  endotracheal tube is seen ending 6-7 cm above the carina. An enteric tube is noted extending below the diaphragm. A left IJ line is seen ending about the proximal SVC, resting against the wall of the SVC, without definite kinking.  Vascular congestion is noted, with bilateral central airspace opacities. Given the appearance of recent CTA, this Julson reflect aspiration pneumonia, though  pulmonary edema could have such an appearance. No pleural effusion or pneumothorax is seen.  The cardiomediastinal silhouette is borderline normal in size. On correlation with recent CTA, there appear to be mildly displaced fractures of the right third through eighth anterolateral ribs, and left fourth through seventh anterolateral ribs.  IMPRESSION: 1. Endotracheal tube seen ending 6-7 cm above the carina. 2. Left IJ line seen ending about the proximal SVC, resting against the wall of the SVC, without definite kinking. 3. Enteric tube noted extending below the diaphragm. 4. Vascular congestion, with bilateral central airspace opacities. Given the recent CTA, this Decuir reflect aspiration pneumonia, though pulmonary edema could have such an appearance. 5. Mildly displaced fractures of the right third through eighth anterolateral ribs, and left fourth through seventh anterolateral ribs, as on recent CTA.   Electronically Signed   By: Garald Balding M.D.   On: 11/27/2013 02:05   Dg Chest Portable 1 View  11/27/2013   CLINICAL DATA:  Status post CPR and intubation  EXAM: PORTABLE CHEST - 1 VIEW  COMPARISON:  No comparisons  FINDINGS: The endotracheal tube tip lies at the level of the clavicular heads which is approximately 7.6 cm above the crotch of the carina. The lungs appear well expanded and clear. The left lateral costophrenic angle is excluded from the study. The cardiopericardial silhouette is top-normal in size. The pulmonary vascularity is not engorged. The esophagogastric tube tip and proximal port project below the inferior  margin of the film.  IMPRESSION: The patient has undergone interval intubation of the trachea. The tip of the tube lies 7.6 cm above the crotch of the carina. Advancement by at least 2 cm is recommended. There is no focal infiltrate nor evidence of a pneumothorax or pneumomediastinum. The cardiopericardial silhouette is enlarged without evidence of significant pulmonary vascular congestion.   Electronically Signed   By: David  Martinique   On: 11/27/2013 01:06   Out Patient Lexiscan Myoview stress test 10/05/2013: 1. Resting EKG NSR, non specific T change. Stress EKG was non diagnostic for ischemia. No ST-T changes of ischemia noted with pharmacologic stress testing. Stress symptoms included being winded. Stress terminated due to completion of protocol. 2. Perfusion imaging study demonstrated mild soft tissue attenuation consistent with diaphragmatic attenuation. A moderate sized scar in this area without reversible ischemia cannot be excluded. The left ventricular systolic function was normal. This is a low risk study.   Echocardiogram 11/27/2013: Left ventricle: The cavity size was normal. There was mild concentric hypertrophy. Systolic function was severely reduced. The estimated ejection fraction was in the range of 25% to 30%. There is anterior, anteroseptal, apical and inferoapical hypokinesis - suggestive of LAD territory ischemia/infarct or possibly Takatsubo cardiomyopathy. The study is not technically sufficient to allow evaluation of LV diastolic function.   ASSESSMENT AND PLAN:  1. Cardiac arrest 2. Non-ST elevation myocardial infarction 3. Cardiomyopathy with severe LV systolic dysfunction 4. History of hypertension 5. History of renal carcinoma status post right nephrectomy done recently.  Recommendation: I discussed the findings of the echocardiogram with the family members, also discuss with pulmonary critical care, at this point suggestion is that the cardiac arrest could have been  related to ACS, as his multi-organ failure could potentially get worse and with increased need for pressors, we want to proceed with coronary angiography to evaluate for significant proximal major coronary vessel disease.  This is being done for worsening lactic acidosis and felt to be cardiac etiology. I discussed the findings with the patient's  daughter, at the bedside and explained to them regarding the risks, benefits. Patient is high risk. He is also at high risk for acute kidney injury due to shock. We'll try to minimize contrast use.  [name], MD 11/27/2013, 6:03 PM [facility] [place] Pager: [phone] Office: [phone] If no answer Cell [phone]

## 2013-11-27 NOTE — Procedures (Signed)
Procedure was not staffed by me at bedside during my shift   Dr. Brand Males, M.D., [facility].C.P Pulmonary and Critical Care Medicine Staff Physician [facility] Pager: [phone], If no answer or between  15:00h - 7:00h: call [phone]  11/27/2013 7:57 AM

## 2013-11-27 NOTE — ED Notes (Signed)
Pt arrives to ED by [facility] from home, arrives s/p CPR, arrives with pulses, IO and king airway. Pt shocked by FD with AED, unknown rhythm. Pt in PEA for EMS arrival to scene. Pt arrives to ED restless with some raising of arms & reaching. Pt was found down at home in recliner. Pt c/o nausea earlier in the evening, but did not c/o pain. Pt found with emesis around mouth & face and gurgling resps. Family concerned d/t some type of h/o dysphagia and called EMS. Epi given PTA with CPR manual and by LUCAS.

## 2013-11-27 NOTE — Progress Notes (Signed)
ANTICOAGULATION CONSULT NOTE - Follow Up Consult  Pharmacy Consult for heparin Indication: s/p cardiac arrest, r/o acs  No Known Allergies  Patient Measurements: Height: 5\' 8"  (172.7 cm) Weight: 181 lb (82.1 kg) IBW/kg (Calculated) : 68.4 Heparin Dosing Weight: 80 kg  Vital Signs: Temp: 101.8 F (38.8 C) (04/28 1400) Temp src: Core (Comment) (04/28 1400) BP: 122/75 mmHg (04/28 1400) Pulse Rate: 120 (04/28 1400)  Labs:  Recent Labs  11/26/13 2345 11/26/13 2348 11/27/13 0130 11/27/13 0206 11/27/13 0400 11/27/13 0806 11/27/13 1406  HGB 12.9*  --  12.1*  --  12.1*  --   --   HCT 38.1*  --  35.9*  --  35.9*  --   --   PLT 243  --  259  --  251  --   --   APTT 35  --   --   --   --   --   --   LABPROT 14.5  --   --   --   --   --   --   INR 1.15  --   --   --   --   --   --   CREATININE 1.37* 1.38*  --   --  1.26  --   --   TROPONINI  --   --   --  7.92*  --  >20.00* >20.00*    Estimated Creatinine Clearance: 62.7 ml/min (by C-G formula based on Cr of 1.26).   Medications:  Scheduled:  . antiseptic oral rinse  15 mL Mouth Rinse QID  . aspirin  325 mg Oral Daily  . chlorhexidine  15 mL Mouth Rinse BID  . insulin aspart  0-15 Units Subcutaneous 6 times per day  . pantoprazole (PROTONIX) IV  40 mg Intravenous Q24H  . phenytoin (DILANTIN) IV  100 mg Intravenous 3 times per day  . piperacillin-tazobactam (ZOSYN)  IV  3.375 g Intravenous Q8H  . vancomycin  1,000 mg Intravenous Q12H   Infusions:  . sodium chloride 20 mL/hr (11/27/13 0139)  . sodium chloride 125 mL/hr at 11/27/13 1100  . feeding supplement (VITAL AF 1.2 CAL)    . fentaNYL infusion INTRAVENOUS 50 mcg/hr (11/27/13 1433)  . heparin 1,000 Units/hr (11/27/13 0915)  . midazolam (VERSED) infusion 2 mg/hr (11/27/13 1540)  . norepinephrine (LEVOPHED) Adult infusion 35 mcg/min (11/27/13 1310)  . vasopressin (PITRESSIN) infusion - *FOR SHOCK* 0.03 Units/min (11/27/13 1013)    Assessment: 63 yo male s/p  cardiac arrest and r/o ACS is currently on subtherapeutic heparin.  Heparin level is <0.1.  Verified with RN that heparin drip is running and have no problem.  Goal of Therapy:  Heparin level 0.3-0.7 units/ml Monitor platelets by anticoagulation protocol: Yes   Plan:  1) Increase heparin drip to 1250 units/hr.  2) f/u after cath  Tsz-Yin Lezley Bedgood 11/27/2013,3:47 PM

## 2013-11-27 NOTE — Progress Notes (Signed)
ANTICOAGULATION CONSULT NOTE - Initial Consult  Pharmacy Consult for heparin and dilantin Indication: s/p cardiac arrest, r/o acs, seizure disorder  No Known Allergies  Patient Measurements: Height: 5\' 8"  (172.7 cm) Weight: 181 lb (82.1 kg) IBW/kg (Calculated) : 68.4 Heparin Dosing Weight: 80kg  Vital Signs: Temp: 100.4 F (38 C) (04/28 0758) Temp src: Core (Comment) (04/28 0758) BP: 89/60 mmHg (04/28 0738) Pulse Rate: 88 (04/28 0738)  Labs:  Recent Labs  11/26/13 2345 11/26/13 2348 11/27/13 0206 11/27/13 0400  HGB 12.9*  --   --  12.1*  HCT 38.1*  --   --  35.9*  PLT 243  --   --  251  APTT 35  --   --   --   LABPROT 14.5  --   --   --   INR 1.15  --   --   --   CREATININE 1.37* 1.38*  --  1.26  TROPONINI  --   --  7.92*  --     Estimated Creatinine Clearance: 62.7 ml/min (by C-G formula based on Cr of 1.26).   Medical History: Past Medical History  Diagnosis Date  . HTN (hypertension)   . Seizures   . Cardiac arrest   . Cancer     Medications:  Home meds pending  Assessment: 63 year old male s/p cardiac arrest admitted to CCU. Patient with positive cardiac enzymes, new orders received to start IV heparin with possible cath. Baseline coags were normal, patient does not appear to be on any anticoagulants prior to admission.  Patient with noted history of a seizure disorder on phenytoin. Level draw was subtherapeutic at 2.7. Noted possible aspiration when patient found but not likely to be seizure related. Will order loading dose of phenytoin and continue stated home dose of 300mg  per day divided every 8 hours.  Goal of Therapy:  Heparin level 0.3-0.7 units/ml Monitor platelets by anticoagulation protocol: Yes Phenytoin level 10-20   Plan:  Start heparin infusion at 1000 units/hr - no bolus as patient received sq heparin this morning. Check anti-Xa level in 6 hours and daily while on heparin Continue to monitor H&H and platelets  Erin Hearing  PharmD., BCPS Clinical Pharmacist Pager [phone] 11/27/2013 12:44 PM

## 2013-11-27 NOTE — Progress Notes (Signed)
INITIAL NUTRITION ASSESSMENT  DOCUMENTATION CODES Per approved criteria  -Not Applicable   INTERVENTION: 1.  Enteral nutrition; initiate Vital 1.2 @ 20 mL/hr continuous.  Advance by 10 mL q 4 hrs to 70 mL/hr goal to provide 2016 kcal, 126g protein, 1362 mL free water.  NUTRITION DIAGNOSIS: Inadequate oral intake related to inability to eat as evidenced by NPO, intubated.   Monitor:  1.  Enteral nutrition; initiation with tolerance.  Pt to meet >/=90% estimated needs with nutrition support.  2.  Wt/wt change; monitor trends  Reason for Assessment: vent, consult for TF initiation and management  63 y.o. male  Admitting Dx: Cardiac arrest  ASSESSMENT: Pt admitted with cardiac arrest.  Pt with h/o seizures disorder.   Patient is currently intubated on ventilator support MV: 9.4 L/min Temp (24hrs), Avg:98.7 F (37.1 C), Min:95.4 F (35.2 C), Max:102.2 F (39 C)  Propofol: none  Nutrition Focused Physical Exam: Subcutaneous Fat:  Orbital Region: mild wasting Upper Arm Region: WNL Thoracic and Lumbar Region: WNL  Muscle:  Temple Region: mild wasting Clavicle Bone Region: WNL Clavicle and Acromion Bone Region: WNL Scapular Bone Region: WNL Dorsal Hand: WNL Patellar Region: WNL Anterior Thigh Region: WNL Posterior Calf Region: WNL  Edema: none present  Pt with mild wasting at orbital and temple regions which [place] be normal body habitus.  Otherwise appears well nourished and was per his usual prior to acute onset of unresponsiveness with unknown downtime PTA. Found with emesis around mouth and face.  Family reported some h/o of dysphagia which will need to be explored once medically appropriate.  No additional nutrition hx available to review.  RD notes liquid stools.  C.diff negative.  Height: Ht Readings from Last 1 Encounters:  11/27/13 5\' 8"  (1.727 m)    Weight: Wt Readings from Last 1 Encounters:  11/27/13 181 lb (82.1 kg)    Ideal Body Weight: 70 kg  %  Ideal Body Weight: 117%  Wt Readings from Last 10 Encounters:  11/27/13 181 lb (82.1 kg)    Usual Body Weight: unknown   % Usual Body Weight: unable to assess  BMI:  Body mass index is 27.53 kg/(m^2).  Estimated Nutritional Needs: Kcal: 2119 Protein: 98-112g Fluid: ~2.0 L/day  Skin: intact  Diet Order: NPO  EDUCATION NEEDS: -Education not appropriate at this time   Intake/Output Summary (Last 24 hours) at 11/27/13 1221 Last data filed at 11/27/13 1100  Gross per 24 hour  Intake 2670.38 ml  Output    320 ml  Net 2350.38 ml    Last BM: 4/28  Labs:   Recent Labs Lab 11/26/13 2345 11/26/13 2348 11/27/13 0400  NA 141  --  138  K 4.8  --  4.1  CL 100  --  104  CO2 18*  --  16*  BUN 15  --  19  CREATININE 1.37* 1.38* 1.26  CALCIUM 9.4  --  7.8*  MG  --   --  1.5  PHOS  --   --  3.1  GLUCOSE 299*  --  248*    CBG (last 3)   Recent Labs  11/27/13 0224 11/27/13 0454 11/27/13 0803  GLUCAP 267* 206* 159*    Scheduled Meds: . antiseptic oral rinse  15 mL Mouth Rinse QID  . aspirin  325 mg Oral Daily  . chlorhexidine  15 mL Mouth Rinse BID  . insulin aspart  0-15 Units Subcutaneous 6 times per day  . pantoprazole (PROTONIX) IV  40 mg  Intravenous Q24H  . piperacillin-tazobactam (ZOSYN)  IV  3.375 g Intravenous Q8H  . vancomycin  1,000 mg Intravenous Q12H    Continuous Infusions: . sodium chloride 20 mL/hr (11/27/13 0139)  . sodium chloride 125 mL/hr at 11/27/13 1100  . fentaNYL infusion INTRAVENOUS    . heparin 1,000 Units/hr (11/27/13 0915)  . midazolam (VERSED) infusion    . norepinephrine (LEVOPHED) Adult infusion 35 mcg/min (11/27/13 1008)  . vasopressin (PITRESSIN) infusion - *FOR SHOCK* 0.03 Units/min (11/27/13 1013)    Past Medical History  Diagnosis Date  . HTN (hypertension)   . Seizures   . Cardiac arrest   . Cancer     History reviewed. No pertinent past surgical history.  Brynda Greathouse, MS RD LDN Clinical Inpatient  Diet[employer] Pager: [phone] Weekend/After hours pager: [phone]

## 2013-11-27 NOTE — H&P (Signed)
PULMONARY / CRITICAL CARE MEDICINE   Name: Gilbert [name] MRN: [id number] DOB: 06/16/1951    ADMISSION DATE:  11/26/2013 CONSULTATION DATE:  11/27/2013  REFERRING MD :  EDP PRIMARY SERVICE: PCCM  CHIEF COMPLAINT:  Post-arrest  BRIEF PATIENT DESCRIPTION:    63 year old male with PMH of seizures, HTN, and recent nephrectomy. Found unresponsive in recliner 4/27 with unknown down time, could have been as much as 2 hours. EMS was called. VF/VT at EMS arrival (per NP reports on this were very conflicting if happened or not) which then became PEA arrest. Intubated, ACLS was performed. Reportedly 2 rounds of CPR and epi achieved ROSC. In ED found to have mildly elevated troponin   SIGNIFICANT EVENTS / STUDIES:  4/27 PEA arrest 4/28 CT head > negative 4/28 CTA chest >>>  LINES / TUBES: OETT 4/27 >>> CVL 4/28 >>> A line 4/28 >>> Foley 4/28 >>>  CULTURES: Blood 4/28 >>> Urine 4/28 >>> Sputum 4/28 >>> PCT 4/28>>  ANTIBIOTICS: Zosyn 4/28 >>> Vanc 4/28 >>>  HISTORY OF PRESENT ILLNESS:  63 year old male with PMH of seizures, HTN, and recent nephrectomy. Found unresponsive in recliner 4/27 with unknown down time, could have been as much as 2 hours. EMS was called. VF/VT at EMS arrival (per NP reports on this were very conflicting if happened or not) which then became PEA arrest. Intubated, ACLS was performed. Reportedly 2 rounds of CPR and epi achieved ROSC. In ED found to have mildly elevated troponin  PAST MEDICAL HISTORY :  Past Medical History  Diagnosis Date  . HTN (hypertension)   . Seizures   . Cardiac arrest   . Cancer    No past surgical history on file. Prior to Admission medications   Not on File   Allergies not on file  FAMILY HISTORY:  No family history on file. SOCIAL HISTORY:  has no tobacco, alcohol, and drug history on file.  REVIEW OF SYSTEMS:  Unable  SUBJECTIVE:   VITAL SIGNS: Temp:  [95.4 F (35.2 C)-96.3 F (35.7 C)] 96.3 F (35.7 C) (04/28  0030) Pulse Rate:  [100-115] 100 (04/28 0015) Resp:  [18-30] 30 (04/28 0030) BP: (80-112)/(30-78) 80/57 mmHg (04/28 0015) SpO2:  [77 %-100 %] 100 % (04/28 0015) FiO2 (%):  [100 %] 100 % (04/27 2345) Weight:  [77.111 kg (170 lb)] 77.111 kg (170 lb) (04/27 2355) HEMODYNAMICS:   VENTILATOR SETTINGS: Vent Mode:  [-] PRVC FiO2 (%):  [100 %] 100 % Set Rate:  [16 bmp] 16 bmp Vt Set:  [600 mL] 600 mL PEEP:  [5 cmH20] 5 cmH20 Plateau Pressure:  [16 cmH20] 16 cmH20 INTAKE / OUTPUT: Intake/Output     04/27 0701 - 04/28 0700   I.V. (mL/kg) 1000 (13)   Total Intake(mL/kg) 1000 (13)   Net +1000         PHYSICAL EXAMINATION: General: intubated male Neuro:  Sedated at time of staff MD exam (seen by NP reaching for et tube and moving all 4s) HEENT:  [place]/AT,  No JVD noted Cardiovascular:  RRR Lungs:  Clear anteriorly Abdomen:  Soft, non-distended Musculoskeletal:  Moves all four extremities at times.  Skin:  Intact  LABS: Updated at 7.45 am PULMONARY  Recent Labs Lab 11/27/13 0230  PHART 7.251*  PCO2ART 36.5  PO2ART 86.5  HCO3 15.5*  TCO2 16.6  O2SAT 94.1    CBC  Recent Labs Lab 11/26/13 2345 11/27/13 0400  HGB 12.9* 12.1*  HCT 38.1* 35.9*  WBC 47.4* 25.8*  PLT 243 251    COAGULATION  Recent Labs Lab 11/26/13 2345  INR 1.15    CARDIAC   Recent Labs Lab 11/27/13 0206  TROPONINI 7.92*   No results found for this basename: PROBNP,  in the last 168 hours   CHEMISTRY  Recent Labs Lab 11/26/13 2345 11/26/13 2348 11/27/13 0400  NA 141  --  138  K 4.8  --  4.1  CL 100  --  104  CO2 18*  --  16*  GLUCOSE 299*  --  248*  BUN 15  --  19  CREATININE 1.37* 1.38* 1.26  CALCIUM 9.4  --  7.8*  MG  --   --  1.5  PHOS  --   --  3.1   Estimated Creatinine Clearance: 62.7 ml/min (by C-G formula based on Cr of 1.26).   LIVER  Recent Labs Lab 11/26/13 2345  AST 347*  ALT 201*  ALKPHOS 270*  BILITOT 0.4  PROT 6.7  ALBUMIN 3.1*  INR 1.15      INFECTIOUS  Recent Labs Lab 11/26/13 2348 11/27/13 0150  LATICACIDVEN 8.4* 3.3*     ENDOCRINE CBG (last 3)   Recent Labs  11/27/13 0224 11/27/13 0454  GLUCAP 267* 206*         IMAGING x48h  Ct Head Wo Contrast  11/27/2013   CLINICAL DATA:  Status post CPR.  Evaluate for anoxic injury.  EXAM: CT HEAD WITHOUT CONTRAST  TECHNIQUE: Contiguous axial images were obtained from the base of the skull through the vertex without intravenous contrast.  COMPARISON:  None.  FINDINGS: There is no evidence of acute infarction, mass lesion, or intra- or extra-axial hemorrhage on CT.  Gray-white differentiation is preserved. There is no definite immediate CT evidence for anoxic brain injury. Mild periventricular white matter change likely reflects small vessel ischemic microangiopathy. Mild cerebellar atrophy is noted.  The brainstem and fourth ventricle are within normal limits. The basal ganglia are unremarkable in appearance. No mass effect or midline shift is seen.  There is no evidence of fracture; visualized osseous structures are unremarkable in appearance. Mild bilateral proptosis is noted. The visualized portions of the orbits are otherwise unremarkable. The paranasal sinuses and mastoid air cells are well-aerated. No significant soft tissue abnormalities are seen.  IMPRESSION: 1. No acute intracranial pathology seen on CT. 2. Gray-white differentiation is preserved; no definite immediate CT evidence to suggest anoxic brain injury. 3. Mild small vessel ischemic microangiopathy. 4. Mild bilateral proptosis noted.   Electronically Signed   By: Garald Balding M.D.   On: 11/27/2013 01:33   Ct Angio Chest Pe W/cm &/or Wo Cm  11/27/2013   CLINICAL DATA:  Hypoxia.  Respiratory failure.  Status post CPR.  EXAM: CT ANGIOGRAPHY CHEST WITH CONTRAST  TECHNIQUE: Multidetector CT imaging of the chest was performed using the standard protocol during bolus administration of intravenous contrast.  Multiplanar CT image reconstructions and MIPs were obtained to evaluate the vascular anatomy.  CONTRAST:  100 mL of Omnipaque 350 IV contrast  COMPARISON:  Chest radiograph performed 11/26/2013  FINDINGS: There is no evidence of pulmonary embolus.  There is partial consolidation of both lower lung lobes, with associated air bronchograms, raising concern for pneumonia. Given recent CPR, this could reflect aspiration. Mild emphysematous change is noted at the upper lung lobes. No pleural effusion or pneumothorax is seen. No masses are identified; no abnormal focal contrast enhancement is seen.  The patient's endotracheal tube is seen ending 7 cm above  the carina. An enteric tube is noted extending into the antrum of the stomach. No significant mediastinal lymphadenopathy is seen. No pericardial effusion is identified. The great vessels are grossly unremarkable in appearance. No axillary lymphadenopathy is seen. The visualized portions of the thyroid gland are unremarkable in appearance.  The visualized portions of the liver and spleen are unremarkable.  No acute osseous abnormalities are seen.  Review of the MIP images confirms the above findings.  IMPRESSION: 1. No evidence of pulmonary embolus. 2. Partial consolidation of both lower lung lobes, with associated air bronchograms, raising concern for pneumonia. Given recent CPR, this could reflect aspiration. 3. Mild emphysematous change at the upper lung lobes. 4. Endotracheal tube seen ending 7 cm above the carina. This could be advanced 4 cm, as deemed clinically appropriate.   Electronically Signed   By: Garald Balding M.D.   On: 11/27/2013 01:48   Dg Chest Port 1 View  11/27/2013   CLINICAL DATA:  Central line placement.  EXAM: PORTABLE CHEST - 1 VIEW  COMPARISON:  Chest radiograph performed 11/26/2013, and CTA of the chest performed earlier today at 1:03 a.m.  FINDINGS: The patient's endotracheal tube is seen ending 6-7 cm above the carina. An enteric tube is  noted extending below the diaphragm. A left IJ line is seen ending about the proximal SVC, resting against the wall of the SVC, without definite kinking.  Vascular congestion is noted, with bilateral central airspace opacities. Given the appearance of recent CTA, this Gillihan reflect aspiration pneumonia, though pulmonary edema could have such an appearance. No pleural effusion or pneumothorax is seen.  The cardiomediastinal silhouette is borderline normal in size. On correlation with recent CTA, there appear to be mildly displaced fractures of the right third through eighth anterolateral ribs, and left fourth through seventh anterolateral ribs.  IMPRESSION: 1. Endotracheal tube seen ending 6-7 cm above the carina. 2. Left IJ line seen ending about the proximal SVC, resting against the wall of the SVC, without definite kinking. 3. Enteric tube noted extending below the diaphragm. 4. Vascular congestion, with bilateral central airspace opacities. Given the recent CTA, this Achterberg reflect aspiration pneumonia, though pulmonary edema could have such an appearance. 5. Mildly displaced fractures of the right third through eighth anterolateral ribs, and left fourth through seventh anterolateral ribs, as on recent CTA.   Electronically Signed   By: Garald Balding M.D.   On: 11/27/2013 02:05   Dg Chest Portable 1 View  11/27/2013   CLINICAL DATA:  Status post CPR and intubation  EXAM: PORTABLE CHEST - 1 VIEW  COMPARISON:  No comparisons  FINDINGS: The endotracheal tube tip lies at the level of the clavicular heads which is approximately 7.6 cm above the crotch of the carina. The lungs appear well expanded and clear. The left lateral costophrenic angle is excluded from the study. The cardiopericardial silhouette is top-normal in size. The pulmonary vascularity is not engorged. The esophagogastric tube tip and proximal port project below the inferior margin of the film.  IMPRESSION: The patient has undergone interval intubation  of the trachea. The tip of the tube lies 7.6 cm above the crotch of the carina. Advancement by at least 2 cm is recommended. There is no focal infiltrate nor evidence of a pneumothorax or pneumomediastinum. The cardiopericardial silhouette is enlarged without evidence of significant pulmonary vascular congestion.   Electronically Signed   By: David  Martinique   On: 11/27/2013 01:06       ASSESSMENT / PLAN:  PULMONARY A:  Acute respiratory failure following cardiac arrest Probable aspiration  P:   Full ventilatory support Goal pH 7.35 - 7.45 Pulmonary hygiene Follow ABG Follow CXR  CARDIOVASCULAR A:  S/p PEA arrest with out of hospital arrest and unknown down time, PE ruled out Shock likely cardiogenic - cannot exclude septic Possible NSTEMI - RBBB on ECG, uncertain if this is new finding. Cards has evaluated ECG and did not feel need to emergently cath.   - In Circulatory shock on 2 pressors. Non sustained V Tach x 2 runs with low Mag  P:  IVF resuscitation Levophed and epi gtt to keep MAP > 65 Titrate epi down to off first when able.  Insert CVL & Art Line Trend CVP Trend lactic acid Trend cardiac enzymes 2D echo No ACS heparin per cards - call Cards in morning for formal consult.  No role for therapeutic hypothermia - unknown down time, purposeful movements post-resuscitation Definitely aim for Normothermia - avoid fever (staff MD note) Correct Mag  RENAL A:   No acute issue Renal Ca S/p nephrectomy   - low mag P:   Replete mag (done by resident) Monitor Bmet, mag and phos  GASTROINTESTINAL A:   No acute issues  P:   PPI Insert OGT NPO  HEMATOLOGIC A:   Severe Leukocytosis, likely sepsis Mild anemia  P:  Monitor CBC PRBC for  hgb <= 8gm%  INFECTIOUS A:   Possible aspiration PNA  P:   Empiric ABX Follow cultures Check PCT (staff md comment)  ENDOCRINE A:   No acute issues  P:   CBG q4 while NPO Check TSH  NEUROLOGIC A:   Acute  encephalopathy but has purposeful movements Seizure Hx - on dilantin  P:   Sedation with continuous versed and prn fentanyl Spot EEG to r/u seizure activity Check dilantin level, hold dilantin until level results.  UDS No role hypothermia but maintain normothermia  Global: NP had : Long discussion with family regarding code status. At this time they would like to make him a limited code blue.   Georgann Housekeeper, ACNP [facility] Pager [phone] or [phone] 1am 11/27/13    STAFF NOTE: I, Dr Ann Lions have personally reviewed patient's available data, including medical history, events of note, physical examination and test results as part of my evaluation. I have discussed with resident/NP and other care providers such as pharmacist, RN and RRT.  In addition,  I personally evaluated patient and elicited key findings of cardiac  Arrest, s/p PEA arrest out of hospital with unknown down time and noticed to be purposeful in ED reaching for ET tube. Maintain normothermia (avoid fever) . Hve called cardiology for formal consultation.  Rest per NP/medical resident whose note is outlined above and that I agree with  The patient is critically ill with multiple organ systems failure and requires high complexity decision making for assessment and support, frequent evaluation and titration of therapies, application of advanced monitoring technologies and extensive interpretation of multiple databases.   Critical Care Time devoted to patient care services described in this note is  45  Minutes.  Dr. Brand Males, M.D., [facility].C.P Pulmonary and Critical Care Medicine Staff Physician [facility] Pager: [phone], If no answer or between  15:00h - 7:00h: call [phone]  11/27/2013 7:53 AM

## 2013-11-27 NOTE — ED Notes (Signed)
Scan complete.

## 2013-11-27 NOTE — CV Procedure (Signed)
Procedure performed: Selective right and left coronary angiography, LV gram. Placement of circulatory support, Impella  CP via left femoral access, PTCA and stenting of the left main, proximal LAD with implantation of a 4.0 x 18 mm Xience alpine stent. Abdominal aortogram, limited bilateral femoral arteriogram to evaluate for PAD for insertion of Impella Circulatory assist device.  Primary operator: Christen Butter, M.D., [facility]. Secondary operator/assist: M. Burt Knack, M.D., [facility].  Indication: Patient is a 63 year old Caucasian male who is admitted to the hospital with cardiogenic shock, positive cardiac markers for myocardial injury, echocardiogram revealing severe LV systolic dysfunction with anterior and anteroapical and septal severe hypokinesis and global hypokinesis, worsening lactic acidosis, continued hypotension in spite of being on multiple pressors. It was felt that cardiac etiology needs to be excluded for this worsening ischemic symptoms. Patient and cardiogenic shock, intubated.  Hemodynamic data left ventricle pressure was 137/21 with the end diastolic pressure 36 mm of mercury, suggest pulmonary edema pressure. Aortic pressure was 136/74 with a mean of 98 mm of mercury. There was no pressure gradient across the aortic valve.  Angiographic data:   Left main coronary artery: High-grade mid and distal 90% stenosis.  LAD: Large vessel, has ostial 90% stenosis. Mid to distal segment shows very mild luminal irregularity. Gives origin to several small diagonals.  Ramus intermediate: Large vessel. Midsegment of the ramus intermediate shows mild 10-20% luminal irregularity.  Circumflex coronary artery: A small vessel. Severely diffusely diseased. Occluded in the midsegment. Faint collaterals with visualization of obtuse marginal is evident.  Right coronary artery probably dominant vessel, severely diseased distally. Midsegment shows a high-grade 90% stenosis. After the origin of a second marginal  branch, the right coronary artery is severely diffusely diseased and occluded with faint visualization of the distal bed. Contralateral collaterals are present to the distal RCA under evident during injection of the left coronary system.  LV gram: Severe LV systolic dysfunction, ejection fraction 20% with global hypokinesis and anterior and anteroapical akinesis. Mitral regurgitation could not be assessed due to hand injection.  Interventional data Successful PTCA and stenting of the left main coronary artery and proximal LAD with implantation of a 4.0 x 18 mm Xience alpine drug-eluting stent deployed at a peak of 12 atmospheric pressure. Stenosis reduced from 90% to 0% with brisk TIMI-3 to TIMI-3 flow maintained at the end of the procedure.  Insertion of Impella CP Device: Prior to proceeding with intervention, we decided to have circulatory support, advanced into lower in the usual fashion after obtaining left femoral arterial access. After placement of the device, immediate improvement in cardiac output and blood pressure was evident. The cardiac output measured was 2.8 L per minute.  Recommendation: Patient will placed on Aggrastat for 12-18 hours, patient has been given oral anticoagulation with BRILINTA, and he'll also be started on heparin for Impella per protocol. Patient was weaned off vasopressin completely while patient was in the lab immediately after placement of Impella and norepinephrine drip does was reduced from 35 mcg to 5 mcg per kilogram per minute.  Expect improvement in metabolic derangement over the next 24 hours. 75 cc of contrast was utilized for diagnostic and interventional procedure.  Technique of the procedure: Under sterile precautions using a 6 French left femoral arterial access a 5 [place] JL 4 diagnostic catheter was utilized to engage left main coronary artery and angiography was performed. The catheter was then pulled out of the body and a 5 [place] JR 4 diagnostic  catheter was utilized to cross the aortic  valve and left the program was performed in the RAO projection. The same catheter was utilized to engage the right coronary artery and angiography was performed. The catheter then poorer body over J-wire.  Technique of intervention: Using Angiomax for anticoagulation, I decided to obtain right femoral arterial access. There was already a right femoral arterial catheter, 4 [place], which was exchanged using a micropuncture wire, a 6 [place] sheath was introduced under sterile precautions. In the interim we had to re\re obtain left femoral arterial access due to loss of access was obtained the patient, however were able to easily access the left femoral artery and 8 French sheath was introduced under fluoroscopic guidance attempting to obtain same arterial access site.  Be prepared the implant device in the usual fashion, the 8 French sheath was exchanged over a stiff wire and the arteriotomy was dilated with a 12 French sheath, followed by introduction of a 16 French Impella peel-away sheath. Then we introduced Impella under fluoroscopic guidance and this was performed after having introduced a 5 French pigtail catheter into the left ventricle and placing Impella support wire into the ventricle. The placement of the device was confirmed fluoroscopically. Also the waveforms were analyzed. Excellent cardiac output with very stable reforms was evident.  We then proceeded with intervention to the left main coronary artery. I used a BMW guidewire, 0.014" x 190 cm and a cougar XT similar sized wire for intervention. I placed the BMW into the LAD and cougar into the ramus intermediate. A predilatation was performed with 3.0 x 15 mm Trek balloon at 8 atmospheric pressure x2 for 10 seconds each. Significant hemodynamic compromise was evident only transiently. TIMI-3 flow was maintained.  We then decided to stent this with a 4.0 x 18 mm drug-eluting stent as dictated above, and  placing the stent at the ostium of the left main and protruding into the proximal LAD, stent was deployed at 12 atmospheric pressure for 20 seconds. The same balloon was utilized to perform predilatation into the ostium of the left main after pulling the stent balloon into the ascending aorta, a second inflation was performed. Having performed this angiography revealed excellent result with brisk flow without any side branch/ramus intermediate branch, mild. The ramus intermediate wire was withdrawn out of the body and angiography repeated, all the wires and balloons withdrawn outside the body and the guide catheter disengaged and pulled out of the body. Right femoral arterial access sheath was sutured in place. Patient hemodynamically improved immediately throughout the procedure after placement of Impella and stenting. Impella device sutured in place.   I greatly appreciate Dr. Sherren Mocha coming in to assist me with this complex procedure.

## 2013-11-27 NOTE — Progress Notes (Signed)
ANTIBIOTIC CONSULT NOTE - INITIAL  Pharmacy Consult for Vancocin and Zosyn Indication: rule out sepsis  Allergies not on file  Patient Measurements: Weight: 170 lb (77.111 kg)  Vital Signs: Temp: 96.3 F (35.7 C) (04/28 0100) BP: 104/85 mmHg (04/28 0100) Pulse Rate: 25 (04/28 0100) Intake/Output from previous day: 04/27 0701 - 04/28 0700 In: 1000 [I.V.:1000] Out: -  Intake/Output from this shift: Total I/O In: 1000 [I.V.:1000] Out: -   Labs:  Recent Labs  11/26/13 2345 11/26/13 2348  WBC 47.4*  --   HGB 12.9*  --   PLT 243  --   CREATININE 1.37* 1.38*     Microbiology: No results found for this or any previous visit (from the past 720 hour(s)).  Medical History: Past Medical History  Diagnosis Date  . HTN (hypertension)   . Seizures   . Cardiac arrest   . Cancer     Assessment: 63yo male had unwitnessed arrest, arrived to ED hypotensive, now on pressors, concern for PNA/sepsis, to begin IV ABX.  Goal of Therapy:  Vancomycin trough level 15-20 mcg/ml  Plan:  Will begin vancomycin 1000mg  IV Q12H and Zosyn 3.375g IV Q8H and monitor CBC, Cx, levels prn.  Wynona Neat, PharmD, BCPS  11/27/2013,2:20 AM

## 2013-11-27 NOTE — Progress Notes (Signed)
Spoke with Dr. Kieth Brightly regarding changes in heart rate, rhythm and vitals. Orders given and initiated will continue to monitor.

## 2013-11-27 NOTE — Progress Notes (Signed)
Impella Positioning under US guidance. Patient had marked decrease in cardiac output with decreased systemic blood pressure. The device was repositioned under ultrasound guidance with excellent device position and improved cardiac output from 1.8 to 2.8 L per minute.  Levophed will be weaned to a minimal amount, I have started the patient on dopamine at 10 mcg per kilogram per minute.

## 2013-11-27 NOTE — Progress Notes (Signed)
[facility] Progress Note Patient Name: Bawi Lipford DOB: Jun 04, 1951 MRN: [id number]  Date of Service  11/27/2013   HPI/Events of Note   Dyssynchrony Breath sounds deminished R   eICU Interventions   PCXR ABG    Intervention Category Major Interventions: Other:  Doree Fudge 11/27/2013, 4:18 PM

## 2013-11-27 NOTE — Progress Notes (Signed)
eLink Physician-Brief Progress Note Patient Name: Gilbert [name] DOB: October 30, 1950 MRN: [id number]  Date of Service  11/27/2013   HPI/Events of Note  Shock, likely cardiogenic TTE >>> EF 25%, RWMA in LAD territory Progressive metabolic acidosis with increasing lactate   eICU Interventions   Cortisol sent Stress dose steroids started Bicabonate 2 amp + infusion Increase vent rate 16 --> 30 Discussed with Dr. Einar Gip --> Possible cardiac cath / IABP    Intervention Category Major Interventions: Shock - evaluation and management  Alaycia Eardley 11/27/2013, 4:56 PM

## 2013-11-27 NOTE — Progress Notes (Signed)
CRITICAL VALUE ALERT  Critical value received:  Troponin  Date of notification:  11/27/2013  Time of notification: 0525  Critical value read back: yes  Nurse who received alert: Duanne Moron RN  Responding MD:  Catha Gosselin MD  Time MD responded:  0530 11/27/2013

## 2013-11-27 NOTE — ED Notes (Signed)
CT ready

## 2013-11-27 NOTE — Progress Notes (Signed)
  Echocardiogram 2D Echocardiogram has been performed.  Basilia Jumbo 11/27/2013, 12:20 PM

## 2013-11-27 NOTE — ED Notes (Signed)
RT and PCCM at [facility], attempted report to [place], preparing to place central line.

## 2013-11-27 NOTE — ED Notes (Signed)
Flexing continues, 2nd 2mg  versed bolus given

## 2013-11-27 NOTE — ED Notes (Signed)
EMS epi d/c'd

## 2013-11-27 NOTE — Procedures (Signed)
Procedure was not staffed by me at bedside during my on call shift   Dr. Brand Males, M.D., [facility].C.P Pulmonary and Critical Care Medicine Staff Physician [facility] Pager: [phone], If no answer or between  15:00h - 7:00h: call [phone]  11/27/2013 7:57 AM

## 2013-11-27 NOTE — ED Notes (Addendum)
Pt in CT, no changes, calmer, no longer pulling, reaching, resisting, VSS, HR 100, pulses palpable, BP low (sBP 84), levophed increased to 20. Preparing to scan.

## 2013-11-27 NOTE — ED Notes (Signed)
Epi & versed infusing, levo continues, VSS, HR 101, BP 91/60, pt to CT.

## 2013-11-27 NOTE — Procedures (Signed)
Arterial Catheter Insertion Procedure Note Gilbert [name] [id number] Laton 09, 1952  Procedure: Insertion of Arterial Catheter  Indications: Blood pressure monitoring and Frequent blood sampling  Procedure Details Consent: Unable to obtain consent because of emergent medical necessity. Time Out: Verified patient identification, verified procedure, site/side was marked, verified correct patient position, special equipment/implants available, medications/allergies/relevent history reviewed, required imaging and test results available.  Performed  Maximum sterile technique was used including antiseptics, cap, gloves, gown, hand hygiene, mask and sheet. Skin prep: Chlorhexidine; local anesthetic administered 20 gauge catheter was inserted into right femoral artery using the Seldinger technique.  Evaluation Blood flow good; BP tracing good. Complications: No apparent complications.   Gilbert [name] 11/27/2013

## 2013-11-27 NOTE — Progress Notes (Signed)
ETT advanced from 24 at the lips to 26 at this lips per nursing message from Georgann Housekeeper, NP. Equal Breath sounds

## 2013-11-28 ENCOUNTER — Inpatient Hospital Stay (HOSPITAL_COMMUNITY): Payer: BC Managed Care – PPO

## 2013-11-28 DIAGNOSIS — R569 Unspecified convulsions: Secondary | ICD-10-CM

## 2013-11-28 DIAGNOSIS — J9600 Acute respiratory failure, unspecified whether with hypoxia or hypercapnia: Secondary | ICD-10-CM

## 2013-11-28 DIAGNOSIS — R579 Shock, unspecified: Secondary | ICD-10-CM

## 2013-11-28 LAB — CBC
HCT: 29.3 % — ABNORMAL LOW (ref 39.0–52.0)
HEMOGLOBIN: 10.2 g/dL — AB (ref 13.0–17.0)
MCH: 27.9 pg (ref 26.0–34.0)
MCHC: 34.8 g/dL (ref 30.0–36.0)
MCV: 80.3 fL (ref 78.0–100.0)
PLATELETS: 115 10*3/uL — AB (ref 150–400)
RBC: 3.65 MIL/uL — ABNORMAL LOW (ref 4.22–5.81)
RDW: 14.4 % (ref 11.5–15.5)
WBC: 15.6 10*3/uL — ABNORMAL HIGH (ref 4.0–10.5)

## 2013-11-28 LAB — BASIC METABOLIC PANEL
BUN: 25 mg/dL — AB (ref 6–23)
CO2: 25 mEq/L (ref 19–32)
Calcium: 7.7 mg/dL — ABNORMAL LOW (ref 8.4–10.5)
Chloride: 99 mEq/L (ref 96–112)
Creatinine, Ser: 2.03 mg/dL — ABNORMAL HIGH (ref 0.50–1.35)
GFR calc Af Amer: 38 mL/min — ABNORMAL LOW (ref >90)
GFR, EST NON AFRICAN AMERICAN: 33 mL/min — AB (ref >90)
Glucose, Bld: 144 mg/dL — ABNORMAL HIGH (ref 70–99)
Potassium: 3.8 mEq/L (ref 3.7–5.3)
Sodium: 138 mEq/L (ref 137–147)

## 2013-11-28 LAB — COMPREHENSIVE METABOLIC PANEL
ALK PHOS: 123 U/L — AB (ref 39–117)
ALT: 1165 U/L — AB (ref 0–53)
AST: 2068 U/L — ABNORMAL HIGH (ref 0–37)
Albumin: 2 g/dL — ABNORMAL LOW (ref 3.5–5.2)
BILIRUBIN TOTAL: 0.4 mg/dL (ref 0.3–1.2)
BUN: 24 mg/dL — ABNORMAL HIGH (ref 6–23)
CO2: 23 mEq/L (ref 19–32)
Calcium: 8 mg/dL — ABNORMAL LOW (ref 8.4–10.5)
Chloride: 102 mEq/L (ref 96–112)
Creatinine, Ser: 1.7 mg/dL — ABNORMAL HIGH (ref 0.50–1.35)
GFR calc non Af Amer: 41 mL/min — ABNORMAL LOW (ref >90)
GFR, EST AFRICAN AMERICAN: 48 mL/min — AB (ref >90)
Glucose, Bld: 149 mg/dL — ABNORMAL HIGH (ref 70–99)
Potassium: 3.4 mEq/L — ABNORMAL LOW (ref 3.7–5.3)
SODIUM: 139 meq/L (ref 137–147)
Total Protein: 4.7 g/dL — ABNORMAL LOW (ref 6.0–8.3)

## 2013-11-28 LAB — POCT ACTIVATED CLOTTING TIME
ACTIVATED CLOTTING TIME: 232 s
ACTIVATED CLOTTING TIME: 193 s
ACTIVATED CLOTTING TIME: 171 s
ACTIVATED CLOTTING TIME: 171 s
ACTIVATED CLOTTING TIME: 166 s
ACTIVATED CLOTTING TIME: 160 s
ACTIVATED CLOTTING TIME: 155 s
Activated Clotting Time: 182 seconds
Activated Clotting Time: 199 seconds
Activated Clotting Time: 221 seconds
Activated Clotting Time: 171 seconds
Activated Clotting Time: 171 seconds
Activated Clotting Time: 171 seconds
Activated Clotting Time: 171 seconds
Activated Clotting Time: 160 seconds
Activated Clotting Time: 376 seconds
Activated Clotting Time: 143 seconds
Activated Clotting Time: 160 seconds

## 2013-11-28 LAB — GLUCOSE, CAPILLARY
GLUCOSE-CAPILLARY: 187 mg/dL — AB (ref 70–99)
GLUCOSE-CAPILLARY: 114 mg/dL — AB (ref 70–99)
GLUCOSE-CAPILLARY: 161 mg/dL — AB (ref 70–99)
Glucose-Capillary: 144 mg/dL — ABNORMAL HIGH (ref 70–99)
Glucose-Capillary: 214 mg/dL — ABNORMAL HIGH (ref 70–99)
Glucose-Capillary: 142 mg/dL — ABNORMAL HIGH (ref 70–99)
Glucose-Capillary: 123 mg/dL — ABNORMAL HIGH (ref 70–99)

## 2013-11-28 LAB — BLOOD GAS, ARTERIAL
Acid-Base Excess: 4.3 mmol/L — ABNORMAL HIGH (ref 0.0–2.0)
Acid-base deficit: 1 mmol/L (ref 0.0–2.0)
BICARBONATE: 26.9 meq/L — AB (ref 20.0–24.0)
Bicarbonate: 22.5 mEq/L (ref 20.0–24.0)
Drawn by: 41308
FIO2: 60 %
FIO2: 0.6 %
MECHVT: 550 mL
O2 Saturation: 95.6 %
O2 Saturation: 97.9 %
PCO2 ART: 33.4 mmHg — AB (ref 35.0–45.0)
PEEP/CPAP: 5 cmH2O
PEEP: 5 cmH2O
PH ART: 7.444 (ref 7.350–7.450)
PO2 ART: 76.9 mmHg — AB (ref 80.0–100.0)
Patient temperature: 98.6
Patient temperature: 100
RATE: 30 resp/min
RATE: 24 resp/min
TCO2: 23.5 mmol/L (ref 0–100)
TCO2: 27.8 mmol/L (ref 0–100)
VT: 550 mL
pCO2 arterial: 31.6 mmHg — ABNORMAL LOW (ref 35.0–45.0)
pH, Arterial: 7.543 — ABNORMAL HIGH (ref 7.350–7.450)
pO2, Arterial: 87.3 mmHg (ref 80.0–100.0)

## 2013-11-28 LAB — MAGNESIUM
Magnesium: 2 mg/dL (ref 1.5–2.5)
Magnesium: 1.9 mg/dL (ref 1.5–2.5)

## 2013-11-28 LAB — URINE CULTURE
COLONY COUNT: NO GROWTH
Culture: NO GROWTH

## 2013-11-28 LAB — PHOSPHORUS
Phosphorus: 1.1 mg/dL — ABNORMAL LOW (ref 2.3–4.6)
Phosphorus: 4.4 mg/dL (ref 2.3–4.6)

## 2013-11-28 LAB — APTT: aPTT: 90 seconds — ABNORMAL HIGH (ref 24–37)

## 2013-11-28 LAB — PROTIME-INR
INR: 1.78 — ABNORMAL HIGH (ref 0.00–1.49)
PROTHROMBIN TIME: 20.2 s — AB (ref 11.6–15.2)

## 2013-11-28 LAB — CORTISOL
Cortisol, Plasma: 38.4 ug/dL
Cortisol, Plasma: 37.7 ug/dL

## 2013-11-28 LAB — LACTIC ACID, PLASMA: Lactic Acid, Venous: 2.6 mmol/L — ABNORMAL HIGH (ref 0.5–2.2)

## 2013-11-28 MED ORDER — HEPARIN (PORCINE) IN NACL 100-0.45 UNIT/ML-% IJ SOLN
100.0000 [IU]/h | INTRAMUSCULAR | Status: DC
  Administered 2013-11-28: 100 [IU]/h via INTRAVENOUS

## 2013-11-28 MED ORDER — HEPARIN (PORCINE) IN NACL 100-0.45 UNIT/ML-% IJ SOLN
200.0000 [IU]/h | INTRAMUSCULAR | Status: DC
  Administered 2013-11-29: 300 [IU]/h via INTRAVENOUS
  Filled 2013-11-28: qty 250

## 2013-11-28 MED ORDER — VANCOMYCIN HCL 10 G IV SOLR
1250.0000 mg | INTRAVENOUS | Status: DC
  Filled 2013-11-28: qty 1250

## 2013-11-28 MED ORDER — POTASSIUM PHOSPHATE DIBASIC 3 MMOLE/ML IV SOLN
30.0000 mmol | Freq: Once | INTRAVENOUS | Status: AC
  Administered 2013-11-28: 30 mmol via INTRAVENOUS
  Filled 2013-11-28: qty 10

## 2013-11-28 MED FILL — Sodium Chloride IV Soln 0.9%: INTRAVENOUS | Qty: 50 | Status: AC

## 2013-11-28 NOTE — Progress Notes (Signed)
Echo Lab  2D Echocardiogram completed.  [place], RDCS 11/28/2013 8:02 AM

## 2013-11-28 NOTE — Progress Notes (Signed)
[facility] Progress Note Patient Name: Gilbert [name] DOB: August 18, 1950 MRN: [id number]  Date of Service  11/28/2013   HPI/Events of Note  Hypokalemia and hypophosphatemia   eICU Interventions  Potassium and phos replaced   Intervention Category Intermediate Interventions: Electrolyte abnormality - evaluation and management  Guadelupe Sabin Izabellah Dadisman 11/28/2013, 6:51 AM

## 2013-11-28 NOTE — Progress Notes (Addendum)
ANTICOAGULATION CONSULT NOTE - Initial Consult  Pharmacy Consult for Heparin Indication: Impella pump anticoagulation  No Known Allergies  Patient Measurements: Height: 5\' 8"  (172.7 cm) Weight: 190 lb 11.2 oz (86.5 kg) IBW/kg (Calculated) : 68.4 Heparin Dosing Weight: 86kg  Vital Signs: Temp: 99 F (37.2 C) (04/29 1800) Temp src: Core (Comment) (04/29 1600) BP: 89/70 mmHg (04/29 1800) Pulse Rate: 90 (04/29 1800)  Labs:  Recent Labs  11/26/13 2345  11/27/13 0206 11/27/13 0400 11/27/13 0806 11/27/13 1406 11/27/13 1600 11/27/13 1655 11/27/13 2107 11/28/13 0400 11/28/13 0800 11/28/13 1700  HGB 12.9*  < >  --  12.1*  --   --   --  12.7*  --  10.2*  --   --   HCT 38.1*  < >  --  35.9*  --   --   --  38.0*  --  29.3*  --   --   PLT 243  < >  --  251  --   --   --  274  --  115*  --   --   APTT 35  --   --   --   --   --   --   --   --   --  90*  --   LABPROT 14.5  --   --   --   --   --   --   --   --   --  20.2*  --   INR 1.15  --   --   --   --   --   --   --   --   --  1.78*  --   HEPARINUNFRC  --   --   --   --   --   --  <0.10*  --   --   --   --   --   CREATININE 1.37*  < >  --  1.26  --   --  1.69* 1.65* 1.59* 1.70*  --  2.03*  TROPONINI  --   --  7.92*  --  >20.00* >20.00*  --   --   --   --   --   --   < > = values in this interval not displayed.  Estimated Creatinine Clearance: 39.8 ml/min (by C-G formula based on Cr of 2.03).   Medical History: Past Medical History  Diagnosis Date  . HTN (hypertension)   . Seizures   . Cardiac arrest   . Cancer     Assessment: 63yom s/p impella pump insertion. RN notified pharmacy that ACT (143) has now decreased below the therapeutic range (160-180). Patient has only required the Impella heparin purge solution (50 units of heparin/ml) to keep within therapeutic range up to this point - will now add additional systemic heparin. Heparin purge solution has been running at 16.6 ml/hr (830 units/hr). Per heparin titration  guideline based on ACT of 143, will start heparin rate at 100 units/hr and check follow-up ACT in 1hr. - No bleeding reported per RN - Patient is also receiving tirofiban which will be turned off at 2100 tonight - will have to monitor closely as this [place] affect ACT  Goal of Therapy:  ACT 160-180 Monitor platelets by anticoagulation protocol: Yes   Plan:  1. Start systemic heparin infusion 100 units/hr (1 ml/hr) 2. Check follow-up ACT 1 hour after heparin initiation   [place] [phone] 11/28/2013,6:37 PM    Addendum: Systemic heparin has  been initiated and the subsequent hourly ACTs have been therapeutic at 160 per RN. Continue current heparin rate and Impella heparin protocol.  Janina Mayo, PharmD Clinical Pharmacist [phone] 11/28/2013, 10:12 PM

## 2013-11-28 NOTE — Progress Notes (Signed)
PULMONARY / CRITICAL CARE MEDICINE  Name: Gilbert [name] MRN: [id number] DOB: September 30, 1950    ADMISSION DATE:  11/26/2013 CONSULTATION DATE:  11/28/2013  REFERRING MD : EDP  PRIMARY SERVICE: PCCM   CHIEF COMPLAINT: Cardiac arrest.  BRIEF PATIENT DESCRIPTION: 63 yo with distant history of seizure disorder ( last seizure 10 years ago, on dilantin ) found unresponsive in recliner. VT / VF >>> shock >>> PEA arrest.   SIGNIFICANT EVENTS / STUDIES:  4/27 VT/VF/PEA arrest  12/03/2022 CT head >>> nad  12-03-22 CTA chest >>> No embolism, bilateral airspace disease  12-03-22 TTE >>> EF 25% to 30%. There is anterior, anteroseptal, apical and inferoapical hypokinesis 2022/12/03 TTE post cath>>>  Fleeman 03, 2024 EEG>>> Normal asleep EEG Wilczak 03, 2024 Cardiogenic shock and emergency cath>>>DES stents to Left main and proximal LAD, Insertion of Impella CP Device 4/29 TTE>>>  LINES / TUBES:  OETT 4/27 >>>  OGT 4/27 >>>  L IJ CVL 12-03-2022 >>>  RF AL 12-03-2022 >>>  Foley 12-03-2022 >>>  R Femoral sheath 03-Maffia-2024 >>>  CULTURES:  2022/12/03 MRSA PCR >>> neg Brittle 03, 2024 Blood >>>  12-03-22 Urine >>> neg 4/29 Sputum >>>   ANTIBIOTICS:  Zosyn 12-03-2022 >>>  Vanc 03-Guillen-2024 >>> 4/29  INTERVAL HISTORY: Cardiac cath last night and DES to left main and LAD. On IMPELLA now.   VITAL SIGNS: Temp:  [96.1 F (35.6 C)-102.2 F (39 C)] 100.4 F (38 C) (04/29 0500) Pulse Rate:  [62-131] 107 (04/29 0500) Resp:  [14-39] 30 (04/29 0500) BP: (78-129)/(31-75) 103/59 mmHg (04/29 0400) SpO2:  [92 %-100 %] 98 % (04/29 0500) Arterial Line BP: (85-156)/(46-76) 101/53 mmHg (04/29 0500) FiO2 (%):  [50 %-100 %] 60 % (04/29 0500) Weight:  [181 lb (82.1 kg)-190 lb 11.2 oz (86.5 kg)] 190 lb 11.2 oz (86.5 kg) (04/29 0429)  HEMODYNAMICS: CVP:  [7 mmHg-10 mmHg] 7 mmHg  VENTILATOR SETTINGS: Vent Mode:  [-] PRVC FiO2 (%):  [50 %-100 %] 60 % Set Rate:  [16 bmp-30 bmp] 30 bmp Vt Set:  [550 mL] 550 mL PEEP:  [5 cmH20] 5 cmH20 Plateau Pressure:  [15 cmH20-20 cmH20] 17 cmH20  INTAKE /  OUTPUT: Intake/Output     12-03-22 0701 - 04/29 0700   I.V. (mL/kg) 3987.2 (46.1)   Other 128.6   NG/GT 195   IV Piggyback 1057.5   Total Intake(mL/kg) 5368.3 (62.1)   Urine (mL/kg/hr) 455 (0.2)   Stool 75 (0)   Total Output 530   Net +4838.3         PHYSICAL EXAMINATION: General: Appears acutely ill, mechanically ventilated, synchronous  Neuro: Encephalopathic, nonfocal, cough / gag diminished  HEENT: PERRL, OETT / OGT  Cardiovascular: RRR, no m/r/g  Lungs: Bilateral diminished air entry, no w/r/r  Abdomen: Soft, nontender, bowel sounds diminished  Musculoskeletal: no edema  Skin: Intact  LABS:  CBC  Recent Labs Lab 03-Korber-2015 0400 2013-12-02 1655 11/28/13 0400  WBC 25.8* 31.4* 15.6*  HGB 12.1* 12.7* 10.2*  HCT 35.9* 38.0* 29.3*  PLT 251 274 115*   Coag's  Recent Labs Lab 11/26/13 2345  APTT 35  INR 1.15   BMET  Recent Labs Lab 12-02-13 1655 2013-12-02 2107 11/28/13 0400  NA 136* 138 139  K 5.1 4.1 3.4*  CL 105 105 102  CO2 12* 17* 23  BUN 22 23 24*  CREATININE 1.65* 1.59* 1.70*  GLUCOSE 224* 248* 149*   Electrolytes  Recent Labs Lab 03-Misiaszek-2015 0400 12/02/2013 1600 12/02/2013 1655 12-02-13 2107 11/28/13 0400  CALCIUM 7.8* 7.5* 7.5*  8.1* 8.0*  MG 1.5  --   --   --  2.0  PHOS 3.1 4.0  --   --  1.1*   Sepsis Markers  Recent Labs Lab 11/27/13 0150 11/27/13 0737 11/27/13 0750 11/27/13 1350 11/27/13 2108  LATICACIDVEN 3.3*  --  2.9* 3.6* 4.5*  PROCALCITON  --  10.79  --   --   --    ABG  Recent Labs Lab 11/27/13 1626 11/27/13 2135 11/28/13 0420  PHART 7.200* 7.367 7.444  PCO2ART 24.0* 27.9* 33.4*  PO2ART 70.0* 70.0* 76.9*   Liver Enzymes  Recent Labs Lab 11/26/13 2345 11/28/13 0400  AST 347* 2068*  ALT 201* 1165*  ALKPHOS 270* 123*  BILITOT 0.4 0.4  ALBUMIN 3.1* 2.0*   Cardiac Enzymes  Recent Labs Lab 11/27/13 0206 11/27/13 0806 11/27/13 1406  TROPONINI 7.92* >20.00* >20.00*   Glucose  Recent Labs Lab 11/27/13 0803  11/27/13 1220 11/27/13 1750 11/27/13 2242 11/28/13 0012 11/28/13 0407  GLUCAP 159* 181* 187* 243* 214* 144*   IMAGING:  Ct Head Wo Contrast  11/27/2013   CLINICAL DATA:  Status post CPR.  Evaluate for anoxic injury.  EXAM: CT HEAD WITHOUT CONTRAST  TECHNIQUE: Contiguous axial images were obtained from the base of the skull through the vertex without intravenous contrast.  COMPARISON:  None.  FINDINGS: There is no evidence of acute infarction, mass lesion, or intra- or extra-axial hemorrhage on CT.  Gray-white differentiation is preserved. There is no definite immediate CT evidence for anoxic brain injury. Mild periventricular white matter change likely reflects small vessel ischemic microangiopathy. Mild cerebellar atrophy is noted.  The brainstem and fourth ventricle are within normal limits. The basal ganglia are unremarkable in appearance. No mass effect or midline shift is seen.  There is no evidence of fracture; visualized osseous structures are unremarkable in appearance. Mild bilateral proptosis is noted. The visualized portions of the orbits are otherwise unremarkable. The paranasal sinuses and mastoid air cells are well-aerated. No significant soft tissue abnormalities are seen.  IMPRESSION: 1. No acute intracranial pathology seen on CT. 2. Gray-white differentiation is preserved; no definite immediate CT evidence to suggest anoxic brain injury. 3. Mild small vessel ischemic microangiopathy. 4. Mild bilateral proptosis noted.   Electronically Signed   By: Garald Balding M.D.   On: 11/27/2013 01:33   Ct Angio Chest Pe W/cm &/or Wo Cm  11/27/2013   CLINICAL DATA:  Hypoxia.  Respiratory failure.  Status post CPR.  EXAM: CT ANGIOGRAPHY CHEST WITH CONTRAST  TECHNIQUE: Multidetector CT imaging of the chest was performed using the standard protocol during bolus administration of intravenous contrast. Multiplanar CT image reconstructions and MIPs were obtained to evaluate the vascular anatomy.   CONTRAST:  100 mL of Omnipaque 350 IV contrast  COMPARISON:  Chest radiograph performed 11/26/2013  FINDINGS: There is no evidence of pulmonary embolus.  There is partial consolidation of both lower lung lobes, with associated air bronchograms, raising concern for pneumonia. Given recent CPR, this could reflect aspiration. Mild emphysematous change is noted at the upper lung lobes. No pleural effusion or pneumothorax is seen. No masses are identified; no abnormal focal contrast enhancement is seen.  The patient's endotracheal tube is seen ending 7 cm above the carina. An enteric tube is noted extending into the antrum of the stomach. No significant mediastinal lymphadenopathy is seen. No pericardial effusion is identified. The great vessels are grossly unremarkable in appearance. No axillary lymphadenopathy is seen. The visualized portions of the thyroid gland are  unremarkable in appearance.  The visualized portions of the liver and spleen are unremarkable.  No acute osseous abnormalities are seen.  Review of the MIP images confirms the above findings.  IMPRESSION: 1. No evidence of pulmonary embolus. 2. Partial consolidation of both lower lung lobes, with associated air bronchograms, raising concern for pneumonia. Given recent CPR, this could reflect aspiration. 3. Mild emphysematous change at the upper lung lobes. 4. Endotracheal tube seen ending 7 cm above the carina. This could be advanced 4 cm, as deemed clinically appropriate.   Electronically Signed   By: Garald Balding M.D.   On: 11/27/2013 01:48   Dg Chest Port 1 View  11/27/2013   CLINICAL DATA:  Cardiac arrest, intubation/on ventilator, question pneumothorax  EXAM: PORTABLE CHEST - 1 VIEW  COMPARISON:  Portable exam 1637 hr compared to 0137 hr  FINDINGS: Tip of endotracheal tube projects 6.0 cm above carinal.  Nasogastric tube extends into stomach.  LEFT jugular central venous catheter tip projects over SVC.  Normal heart size, mediastinal contours and  pulmonary vascularity for technique.  Bibasilar atelectasis.  No definite infiltrate, pleural effusion, or pneumothorax.  No acute osseous findings.  IMPRESSION: Bibasilar atelectasis.   Electronically Signed   By: Lavonia Dana M.D.   On: 11/27/2013 16:56   Dg Chest Port 1 View  11/27/2013   CLINICAL DATA:  Central line placement.  EXAM: PORTABLE CHEST - 1 VIEW  COMPARISON:  Chest radiograph performed 11/26/2013, and CTA of the chest performed earlier today at 1:03 a.m.  FINDINGS: The patient's endotracheal tube is seen ending 6-7 cm above the carina. An enteric tube is noted extending below the diaphragm. A left IJ line is seen ending about the proximal SVC, resting against the wall of the SVC, without definite kinking.  Vascular congestion is noted, with bilateral central airspace opacities. Given the appearance of recent CTA, this Speckman reflect aspiration pneumonia, though pulmonary edema could have such an appearance. No pleural effusion or pneumothorax is seen.  The cardiomediastinal silhouette is borderline normal in size. On correlation with recent CTA, there appear to be mildly displaced fractures of the right third through eighth anterolateral ribs, and left fourth through seventh anterolateral ribs.  IMPRESSION: 1. Endotracheal tube seen ending 6-7 cm above the carina. 2. Left IJ line seen ending about the proximal SVC, resting against the wall of the SVC, without definite kinking. 3. Enteric tube noted extending below the diaphragm. 4. Vascular congestion, with bilateral central airspace opacities. Given the recent CTA, this [place] reflect aspiration pneumonia, though pulmonary edema could have such an appearance. 5. Mildly displaced fractures of the right third through eighth anterolateral ribs, and left fourth through seventh anterolateral ribs, as on recent CTA.   Electronically Signed   By: Garald Balding M.D.   On: 11/27/2013 02:05   Dg Chest Portable 1 View  11/27/2013   CLINICAL DATA:  Status post  CPR and intubation  EXAM: PORTABLE CHEST - 1 VIEW  COMPARISON:  No comparisons  FINDINGS: The endotracheal tube tip lies at the level of the clavicular heads which is approximately 7.6 cm above the crotch of the carina. The lungs appear well expanded and clear. The left lateral costophrenic angle is excluded from the study. The cardiopericardial silhouette is top-normal in size. The pulmonary vascularity is not engorged. The esophagogastric tube tip and proximal port project below the inferior margin of the film.  IMPRESSION: The patient has undergone interval intubation of the trachea. The tip of the tube  lies 7.6 cm above the crotch of the carina. Advancement by at least 2 cm is recommended. There is no focal infiltrate nor evidence of a pneumothorax or pneumomediastinum. The cardiopericardial silhouette is enlarged without evidence of significant pulmonary vascular congestion.   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 ATN Hypocalcemia   Hypokalemia Hypophosphatemia  Renal carcinoma s/p nephrectomy  Metabolic acidosis, resolved  P:  Goal CVP  10-12  Goal K 4, Mg 2  Follow BMP/Mg/Phos Q12 Calcium Gluconate 1 g x1 Kphos 30 mmol x 1 D/c Bicarb gtt  GASTROINTESTINAL  A:  Shocked liver GI Px  Nutrition  P:  Protonix  NPO  Hold TF as has to stay flat for Implella and at risk for aspiration Trend LFT Obtain PT/INR/PTT   HEMATOLOGIC  A:  Mild normocytic anemia Mild thrombocytopenia, likely associated heparin, aggrastat Heparinization for IMPELLA protocol VTE Px is not indicated  P:  Trend CBC  Heparin gtt  INFECTIOUS  A:  Possible aspiration pneumonia  C. Dif ruled out  P:  Abx/cx as above  D/c Vancomycin as renal impairment and MRSA screen neg  ENDOCRINE  A:  Hyperglycemia  Possible relative adrenal insufficiency P:  SSI  Consider to stop stress steroids when off stressors  NEUROLOGIC  A:  Acute encephalopathy  H/o seizures, last 10 years ago, on Dilantin, sub therapeutic  P:  No role for therapeutic hypothermia - unknown down time, purposeful movements post-resuscitation  Normothermia  Dilantin Fentanyl / Versed PRN   Charlann Lange, MD PGY-3  [facility]  11/28/2013, 6:30 AM  I have personally obtained history, examined patient, evaluated and interpreted laboratory and imaging results, reviewed medical records, formulated assessment / plan and placed orders.  CRITICAL CARE:  The patient is critically ill with multiple organ systems failure and requires high complexity decision making for assessment and support, frequent evaluation and titration of therapies, application of advanced monitoring technologies and extensive interpretation of multiple databases. Critical Care Time devoted to patient care services described in this note is 35 minutes.   Doree Fudge, MD Pulmonary and [place] Pager: [phone]  11/28/2013, 8:31 AM

## 2013-11-28 NOTE — Progress Notes (Signed)
Nursing: Myoplex( large size foam ) dressing x 2 placed over lateral aspect of left knee/lower thigh to prevent pressure ulcer from Impella catheter located under the left leg immobilizer.

## 2013-11-28 NOTE — Progress Notes (Signed)
Nursing: Patient woke spontaneously. Attempting to reach for ET tube with both hands and sit up in bed. This nurse attempted to reorient the patient to place, date,time, and purpose with no success.  Patient did turn head towards this nurse when name was called . Bolus of Versed 2 mg IV given from infusion bag and 100 mcg of Fentanyl given from IV fentanyl infusion. Second RN had to enter the room to help administer the medications and hold patient down as not to hurt himself or dislodge any lines. Patient reassessed when sedation infusion completed and RASS -3. No lines or tubes placement unchanged.

## 2013-11-28 NOTE — Progress Notes (Signed)
Subjective:  Patient remains intubated.  Requiring much lesser doses of inotropic support.  Urine output appears stable, clear, without any hemolysis.  Objective:  Vital Signs in the last 24 hours: Temp:  [96.1 F (35.6 C)-100.4 F (38 C)] 99 F (37.2 C) (04/29 1800) Pulse Rate:  [73-119] 90 (04/29 1800) Resp:  [16-35] 16 (04/29 1800) BP: (86-122)/(54-71) 89/70 mmHg (04/29 1800) SpO2:  [94 %-100 %] 98 % (04/29 1800) Arterial Line BP: (79-130)/(52-69) 98/69 mmHg (04/29 1800) FiO2 (%):  [50 %-100 %] 50 % (04/29 1800) Weight:  [86.5 kg (190 lb 11.2 oz)] 86.5 kg (190 lb 11.2 oz) (04/29 0429)  Intake/Output from previous day: 04/28 0701 - 04/29 0700 In: 5647.8 [I.V.:4250.8; NG/GT:195; IV Piggyback:1057.5] Out: 575 [Urine:500; Stool:75]  Physical Exam:   General appearance: patient is intubated and sedated. Eyes: negative findings: lids and lashes normal Neck: no adenopathy and no carotid bruit Neck: JVP - normal, carotids 2+= without bruits Resp: clear to auscultation bilaterally Cardio: regular rate and rhythm, S1, S2 normal, no murmur, click, rub or gallop, distant heart sounds. GI: soft, non-tender; bowel sounds normal; no masses,  no organomegaly Extremities: extremities normal, atraumatic, no cyanosis or edema and the extremities are cool, however there is no clinical evidence of arterial insufficiency.  Bilateral groin sites without any hematoma.    Lab Results: BMP  Recent Labs  11/27/13 2107 11/28/13 0400 11/28/13 1700  NA 138 139 138  K 4.1 3.4* 3.8  CL 105 102 99  CO2 17* 23 25  GLUCOSE 248* 149* 144*  BUN 23 24* 25*  CREATININE 1.59* 1.70* 2.03*  CALCIUM 8.1* 8.0* 7.7*  GFRNONAA 45* 41* 33*  GFRAA 52* 48* 38*    CBC  Recent Labs Lab 11/28/13 0400  WBC 15.6*  RBC 3.65*  HGB 10.2*  HCT 29.3*  PLT 115*  MCV 80.3  MCH 27.9  MCHC 34.8  RDW 14.4    HEMOGLOBIN A1C No results found for this basename: HGBA1C, MPG    Cardiac Panel (last 3  results)  Recent Labs  11/27/13 0206 11/27/13 0806 11/27/13 1406  TROPONINI 7.92* >20.00* >20.00*    BNP (last 3 results) No results found for this basename: PROBNP,  in the last 8760 hours  TSH  Recent Labs  11/27/13 0400  TSH 0.961    CHOLESTEROL No results found for this basename: CHOL,  in the last 8760 hours  Hepatic Function Panel  Recent Labs  11/26/13 2345 11/28/13 0400  PROT 6.7 4.7*  ALBUMIN 3.1* 2.0*  AST 347* 2068*  ALT 201* 1165*  ALKPHOS 270* 123*  BILITOT 0.4 0.4    Cardiac Studies: EKG 11/28/2013: Sinus tachycardia at the rate of 104 bpm, rightward axis, left posterior fascicular block, right bundle branch block.  Nonspecific T abnormality.  Low voltage complexes.  Assessment/Plan:  1.  Cardiogenic shock secondary to multilevel vessel critical coronary artery disease. 2.  CAD of the native vessels, successful PTCA and stenting of the left main, proximal LAD with implantation of a 4.0 x 18 mm Xience alpine stent. Abdominal aortogram, limited bilateral femoral arteriogram to evaluate for PAD for insertion of Impella Circulatory assist device. 3.  Multiorgan failure, acute renal failure, shock liver due to cardiac any shock.  Patient presently not on a beta blocker due to low blood pressure, not on a statin due to shocked liver.  Recommendation:   Procedure: Bedside echocardiogram was performed today.  The Impella device had migrated, had to be repositioned to obtain optimal  cardiac output and waveform.  This was done without any complications.  Patient is presently on dopamine at 10 mcg/kg/m.  We will try to wean this off for the next 12-24 hours.  I would like to continue circulatory assist with Impela for additional 18-24 hours, plan on pulling the sheaths out in the morning if his requirement for dopamine has decreased and he remains hemodynamically stable.  After the devices and the sheaths are pulled, then we can start planning on taking him off  the sedatives and weaning him off the ventilator support.  Patient has improved hemodynamically.   [name], M.D. 11/28/2013, 6:35 PM [facility] Pager: [phone] Office: [phone] If no answer: [phone]

## 2013-11-28 NOTE — Progress Notes (Signed)
ANTICOAGULATION CONSULT NOTE   Pharmacy Consult for heparin Indication: s/p cardiac arrest, r/o acs  No Known Allergies  Patient Measurements: Height: 5\' 8"  (172.7 cm) Weight: 190 lb 11.2 oz (86.5 kg) IBW/kg (Calculated) : 68.4 Heparin Dosing Weight: 80 kg  Vital Signs: Temp: 100.4 F (38 C) (04/29 0500) BP: 109/61 mmHg (04/29 0735) Pulse Rate: 111 (04/29 0735)  Labs:  Recent Labs  11/26/13 2345  11/27/13 0206 11/27/13 0400 11/27/13 0806 11/27/13 1406 11/27/13 1600 11/27/13 1655 11/27/13 2107 11/28/13 0400  HGB 12.9*  < >  --  12.1*  --   --   --  12.7*  --  10.2*  HCT 38.1*  < >  --  35.9*  --   --   --  38.0*  --  29.3*  PLT 243  < >  --  251  --   --   --  274  --  115*  APTT 35  --   --   --   --   --   --   --   --   --   LABPROT 14.5  --   --   --   --   --   --   --   --   --   INR 1.15  --   --   --   --   --   --   --   --   --   HEPARINUNFRC  --   --   --   --   --   --  <0.10*  --   --   --   CREATININE 1.37*  < >  --  1.26  --   --  1.69* 1.65* 1.59* 1.70*  TROPONINI  --   --  7.92*  --  >20.00* >20.00*  --   --   --   --   < > = values in this interval not displayed.  Estimated Creatinine Clearance: 47.6 ml/min (by C-G formula based on Cr of 1.7).  Assessment: 63 yo male s/p cardiac arrest and and s/p cath with DES placed to LM and prox LAD. Patient started on impella device during procedure. He continues on heparin/dextrose purge solution with therapeutic ACT's followed by nursing. Aggrastat will be continued through tonight. No bleeding issues noted but hgb and plt counts are significantly down post cath. Will continue to follow CBC. Goal of Therapy:  Heparin level 0.3-0.7 units/ml Monitor platelets by anticoagulation protocol: Yes   Plan:  1) Continue heparin purge solution for impella device, follow ACTs peripherally, start systemic heparin if needed 2) Continue Aggrastat through today - stop time in place 3) Follow s/s of bleeding, CBC  Erin Hearing PharmD., BCPS Clinical Pharmacist Pager [phone] 11/28/2013 8:36 AM

## 2013-11-28 NOTE — Progress Notes (Signed)
Impella started to flash position wrong alarm.  Flow control decreased to P5 as per instruction from [place]( [employer] rep). This happened earlier and alarm disappeared when flow control was lowered. Clinical support line called and after trying some adjustments and the alarm continues he suggested to call MD to have the position verified  per ultrasound. Dr. Einar Gip notified and ordered to have echo done in the morning.  And as long as BP is stable to just continue to monitor pt closely.

## 2013-11-29 ENCOUNTER — Inpatient Hospital Stay (HOSPITAL_COMMUNITY): Payer: BC Managed Care – PPO

## 2013-11-29 DIAGNOSIS — Z48812 Encounter for surgical aftercare following surgery on the circulatory system: Secondary | ICD-10-CM

## 2013-11-29 DIAGNOSIS — R0989 Other specified symptoms and signs involving the circulatory and respiratory systems: Secondary | ICD-10-CM

## 2013-11-29 LAB — BLOOD GAS, ARTERIAL
ACID-BASE EXCESS: 3.9 mmol/L — AB (ref 0.0–2.0)
Bicarbonate: 27.8 mEq/L — ABNORMAL HIGH (ref 20.0–24.0)
FIO2: 0.5 %
LHR: 16 {breaths}/min
O2 Saturation: 96.1 %
PCO2 ART: 43 mmHg (ref 35.0–45.0)
PEEP: 5 cmH2O
PH ART: 7.43 (ref 7.350–7.450)
PO2 ART: 83.5 mmHg (ref 80.0–100.0)
Patient temperature: 100
TCO2: 29.1 mmol/L (ref 0–100)
VT: 550 mL

## 2013-11-29 LAB — HEMOGLOBIN AND HEMATOCRIT, BLOOD
HCT: 27.8 % — ABNORMAL LOW (ref 39.0–52.0)
Hemoglobin: 9.3 g/dL — ABNORMAL LOW (ref 13.0–17.0)

## 2013-11-29 LAB — GLUCOSE, CAPILLARY
GLUCOSE-CAPILLARY: 116 mg/dL — AB (ref 70–99)
GLUCOSE-CAPILLARY: 115 mg/dL — AB (ref 70–99)
Glucose-Capillary: 119 mg/dL — ABNORMAL HIGH (ref 70–99)
Glucose-Capillary: 139 mg/dL — ABNORMAL HIGH (ref 70–99)

## 2013-11-29 LAB — CBC
HCT: 28.4 % — ABNORMAL LOW (ref 39.0–52.0)
HEMOGLOBIN: 9.6 g/dL — AB (ref 13.0–17.0)
MCH: 27.9 pg (ref 26.0–34.0)
MCHC: 33.8 g/dL (ref 30.0–36.0)
MCV: 82.6 fL (ref 78.0–100.0)
Platelets: 107 10*3/uL — ABNORMAL LOW (ref 150–400)
RBC: 3.44 MIL/uL — AB (ref 4.22–5.81)
RDW: 14.8 % (ref 11.5–15.5)
WBC: 15.3 10*3/uL — ABNORMAL HIGH (ref 4.0–10.5)

## 2013-11-29 LAB — POCT ACTIVATED CLOTTING TIME
ACTIVATED CLOTTING TIME: 166 s
Activated Clotting Time: 155 seconds
Activated Clotting Time: 155 seconds
Activated Clotting Time: 160 seconds
Activated Clotting Time: 160 seconds
Activated Clotting Time: 160 seconds
Activated Clotting Time: 155 seconds

## 2013-11-29 LAB — POCT I-STAT 3, ART BLOOD GAS (G3+)
Acid-Base Excess: 1 mmol/L (ref 0.0–2.0)
BICARBONATE: 25.1 meq/L — AB (ref 20.0–24.0)
O2 Saturation: 95 %
PH ART: 7.443 (ref 7.350–7.450)
PO2 ART: 72 mmHg — AB (ref 80.0–100.0)
TCO2: 26 mmol/L (ref 0–100)
pCO2 arterial: 36.6 mmHg (ref 35.0–45.0)

## 2013-11-29 LAB — BASIC METABOLIC PANEL
BUN: 28 mg/dL — ABNORMAL HIGH (ref 6–23)
BUN: 29 mg/dL — AB (ref 6–23)
CALCIUM: 7.8 mg/dL — AB (ref 8.4–10.5)
CALCIUM: 7.9 mg/dL — AB (ref 8.4–10.5)
CHLORIDE: 99 meq/L (ref 96–112)
CO2: 25 meq/L (ref 19–32)
CO2: 25 mEq/L (ref 19–32)
CREATININE: 2.04 mg/dL — AB (ref 0.50–1.35)
Chloride: 100 mEq/L (ref 96–112)
Creatinine, Ser: 1.82 mg/dL — ABNORMAL HIGH (ref 0.50–1.35)
GFR calc Af Amer: 38 mL/min — ABNORMAL LOW (ref >90)
GFR calc non Af Amer: 33 mL/min — ABNORMAL LOW (ref >90)
GFR calc non Af Amer: 38 mL/min — ABNORMAL LOW (ref >90)
GFR, EST AFRICAN AMERICAN: 44 mL/min — AB (ref >90)
GLUCOSE: 151 mg/dL — AB (ref 70–99)
Glucose, Bld: 133 mg/dL — ABNORMAL HIGH (ref 70–99)
Potassium: 3.6 mEq/L — ABNORMAL LOW (ref 3.7–5.3)
Potassium: 3.8 mEq/L (ref 3.7–5.3)
Sodium: 138 mEq/L (ref 137–147)
Sodium: 138 mEq/L (ref 137–147)

## 2013-11-29 LAB — PHOSPHORUS
Phosphorus: 4.5 mg/dL (ref 2.3–4.6)
Phosphorus: 4.4 mg/dL (ref 2.3–4.6)

## 2013-11-29 LAB — HEPATIC FUNCTION PANEL
ALBUMIN: 2 g/dL — AB (ref 3.5–5.2)
ALK PHOS: 112 U/L (ref 39–117)
ALT: 990 U/L — AB (ref 0–53)
AST: 595 U/L — ABNORMAL HIGH (ref 0–37)
BILIRUBIN TOTAL: 0.3 mg/dL (ref 0.3–1.2)
Bilirubin, Direct: <0.2 mg/dL (ref 0.0–0.3)
TOTAL PROTEIN: 5 g/dL — AB (ref 6.0–8.3)

## 2013-11-29 LAB — MAGNESIUM
MAGNESIUM: 2 mg/dL (ref 1.5–2.5)
MAGNESIUM: 1.8 mg/dL (ref 1.5–2.5)

## 2013-11-29 LAB — LACTIC ACID, PLASMA: LACTIC ACID, VENOUS: 1.2 mmol/L (ref 0.5–2.2)

## 2013-11-29 LAB — TROPONIN I
Troponin I: >20 ng/mL (ref <0.30)
Troponin I: >20 ng/mL (ref <0.30)
Troponin I: >20 ng/mL (ref <0.30)

## 2013-11-29 LAB — PROCALCITONIN: Procalcitonin: 23.84 ng/mL

## 2013-11-29 MED ORDER — POTASSIUM CHLORIDE 20 MEQ/15ML (10%) PO LIQD
20.0000 meq | Freq: Once | ORAL | Status: AC
  Administered 2013-11-29: 20 meq
  Filled 2013-11-29: qty 15

## 2013-11-29 MED ORDER — PANTOPRAZOLE SODIUM 40 MG PO PACK
40.0000 mg | PACK | Freq: Every day | ORAL | Status: DC
  Administered 2013-11-30: 40 mg
  Filled 2013-11-29: qty 20

## 2013-11-29 NOTE — Progress Notes (Signed)
VASCULAR LAB PRELIMINARY  ARTERIAL  ABI completed:Right ABI indicates adequate arterial flow.  Left ABI indicates moderate to severe reduction in arterial flow.  Duplex imaging of the left lower extremity reveals mild to moderate calcific plaque in the left femoral artery.  Flow is monophasic throughout the left lower extremity while it is biphasic in the right common femoral and femoral arteries.      RIGHT    LEFT    PRESSURE WAVEFORM  PRESSURE WAVEFORM  BRACHIAL 132  T BRACHIAL 126    DP   DP    AT 127 B AT 65 M  PT 134 B PT 66 M  PER   PER 63 M  GREAT TOE  NA GREAT TOE  NA    RIGHT LEFT  ABI 1.02 0.50     Gilbert [name] R Atwell Mcdanel, RVT 11/29/2013, 3:34 PM

## 2013-11-29 NOTE — Progress Notes (Signed)
Dr. Einar Gip notified that left lower extremity cool to palpation and doppler pulse present but significantly more faint than on previous assessments. Aware left groin site stable as documented in DocFlowsheets post Impella removal. Orders received, will continue to assess and monitor patient closely.

## 2013-11-29 NOTE — Progress Notes (Signed)
Subjective:  Patient remains intubated.  Requiring much lesser doses of inotropic support. On 7.5 mcg  Dopa. Urine output appears stable, clear, without any hemolysis.  Objective:  Vital Signs in the last 24 hours: Temp:  [97 F (36.1 C)-98.8 F (37.1 C)] 98.8 F (37.1 C) (04/30 2000) Pulse Rate:  [65-89] 71 (04/30 2000) Resp:  [0-18] 16 (04/30 2000) BP: (84-132)/(53-100) 113/68 mmHg (04/30 2000) SpO2:  [93 %-100 %] 97 % (04/30 2000) Arterial Line BP: (101-120)/(56-84) 103/56 mmHg (04/30 0830) FiO2 (%):  [40 %] 40 % (04/30 2000) Weight:  [90.3 kg (199 lb 1.2 oz)] 90.3 kg (199 lb 1.2 oz) (04/30 0500)  Intake/Output from previous day: 04/29 0701 - 04/30 0700 In: 2755.2 [I.V.:1602.5; NG/GT:100; IV Piggyback:660] Out: 322 [Urine:775]  Physical Exam:   General appearance: patient is intubated and sedated. Eyes: negative findings: lids and lashes normal Neck: no adenopathy and no carotid bruit Neck: JVP - normal, carotids 2+= without bruits Resp: clear to auscultation bilaterally Cardio: regular rate and rhythm, S1, S2 normal, no murmur, click, rub or gallop, distant heart sounds. GI: soft, non-tender; bowel sounds normal; no masses,  no organomegaly Extremities: extremities normal, atraumatic, no cyanosis or edema and the extremities are cool, however there is no clinical evidence of arterial insufficiency.  Bilateral groin sites without any hematoma.    Lab Results: BMP  Recent Labs  11/28/13 1700 11/29/13 0440 11/29/13 1500  NA 138 138 138  K 3.8 3.6* 3.8  CL 99 99 100  CO2 25 25 25   GLUCOSE 144* 151* 133*  BUN 25* 28* 29*  CREATININE 2.03* 2.04* 1.82*  CALCIUM 7.7* 7.8* 7.9*  GFRNONAA 33* 33* 38*  GFRAA 38* 38* 44*    CBC  Recent Labs Lab 11/29/13 0440 11/29/13 1500  WBC 15.3*  --   RBC 3.44*  --   HGB 9.6* 9.3*  HCT 28.4* 27.8*  PLT 107*  --   MCV 82.6  --   MCH 27.9  --   MCHC 33.8  --   RDW 14.8  --     HEMOGLOBIN A1C No results found for  this basename: HGBA1C,  MPG    Cardiac Panel (last 3 results)  Recent Labs  11/27/13 1406 11/29/13 0900 11/29/13 1500  TROPONINI >20.00* >20.00* >20.00*    BNP (last 3 results) No results found for this basename: PROBNP,  in the last 8760 hours  TSH  Recent Labs  11/27/13 0400  TSH 0.961    CHOLESTEROL No results found for this basename: CHOL,  in the last 8760 hours  Hepatic Function Panel  Recent Labs  11/26/13 2345 11/28/13 0400 11/29/13 0440  PROT 6.7 4.7* 5.0*  ALBUMIN 3.1* 2.0* 2.0*  AST 347* 2068* 595*  ALT 201* 1165* 990*  ALKPHOS 270* 123* 112  BILITOT 0.4 0.4 0.3  BILIDIR  --   --  <0.2  IBILI  --   --  NOT CALCULATED    Cardiac Studies: EKG 11/28/2013: Sinus tachycardia at the rate of 104 bpm, rightward axis, left posterior fascicular block, right bundle branch block.  Nonspecific T abnormality.  Low voltage complexes. EKG 11/29/2013: Normal sinus rhythm, nonspecific T abnormality.  Assessment/Plan:  1.  Cardiogenic shock secondary to multilevel vessel critical coronary artery disease. 2.  CAD of the native vessels, successful PTCA and stenting of the left main, proximal LAD with implantation of a 4.0 x 18 mm Xience alpine stent. Abdominal aortogram, limited bilateral femoral arteriogram to evaluate for PAD for insertion  of Impella Circulatory assist device. 3.  Multiorgan failure, acute renal failure, shock liver due to cardiac any shock.  Patient presently not on a beta blocker due to low blood pressure, not on a statin due to shocked liver. Overall improved blood chemistry.  ABGs also reveal decreased metabolic acidosis.  Recommendation: I have discontinued and removed the Impela Device and manual pressure was held.  Patient maintained normal hemodynamics.  Right groin sheath was also removed under my surveillance.  I will keep the patient well sedated through the night, to maintain hemostasis.  Continue dopamine for now.  Continue to follow  through serum troponins.  Patient appears to have purposeful movements neurologically.  If the groin remained stable by tomorrow morning, will start weaning him from the sedatives and narcotics.  Serum creatinine is also stabilized.  Expect significant a urinalysis once his sedation is reduced and renal failure improves.  We will continue to monitor closely.  Addendum: Small hematoma developed after patient woke up from deep sleep, manual pressure was held by nursing staff, hemostasis achieved.  ABIs reveal moderate to severe decrease in perfusion to the left leg, however there is no critical limb ischemia clinically.  We will continue to monitor.   [name], M.D. 11/29/2013, 9:03 PM [facility] Pager: [phone] Office: [phone] If no answer: [phone]

## 2013-11-29 NOTE — Progress Notes (Signed)
Left femoral Impella catheter removed at [facility] by Dr. Einar Gip.  Pressure held by Dr. Einar Gip for 30 minutes, then transferred hold to RN for another 15 minutes.  Site soft with bruising noted.  Vital signs remained stable throughout procedure.

## 2013-11-29 NOTE — Progress Notes (Signed)
NUTRITION FOLLOW UP  Intervention:   1.  Enteral nutrition; if appropriate, initiate Vital 1.2 @ 20 mL/hr continuous. Advance by 10 mL q 4 hrs to 70 mL/hr goal to provide 2016 kcal, 126g protein, 1362 mL free water.  Nutrition Dx:   Inadequate oral intake related to inability to eat as evidenced by NPO, intubated.   Monitor:  1. Enteral nutrition; initiation with tolerance. Pt to meet >/=90% estimated needs with nutrition support. Not met, TFs held for Impella catheter 2. Wt/wt change; monitor trends.  Ongoing.   Assessment:   Pt admitted with cardiac arrest.  Pt with h/o seizure disorder.   Patient is currently intubated on ventilator support MV: 8.8 L/min Temp (24hrs), Avg:98.3 F (36.8 C), Min:97 F (36.1 C), Max:100.2 F (37.9 C)  Propofol: none  TFs were held for Impella cathether as pt was required to lie flat; pt never started on feeds.  This has been removed.  Per RN, pt has developed hematoma on groin. Still cannot raise HOB. Likely to remain intubated through tomorrow at least.   Height: Ht Readings from Last 1 Encounters:  11/27/13 _0  (1.727 m)    Weight Status:   Wt Readings from Last 1 Encounters:  11/29/13 199 lb 1.2 oz (90.3 kg)    Re-estimated needs:  Kcal: 2119 Protein: 98-112g Fluid: ~2.0 L/day  Skin: generalized edema  Diet Order: NPO   Intake/Output Summary (Last 24 hours) at 11/29/13 1046 Last data filed at 11/29/13 1000  Gross per 24 hour  Intake 2248.25 ml  Output   1223 ml  Net 1025.25 ml    Last BM: 4/30   Labs:   Recent Labs Lab 11/28/13 0400 11/28/13 1700 11/29/13 0440  NA 139 138 138  K 3.4* 3.8 3.6*  CL 102 99 99  CO2 _1 BUN 24* 25* 28*  CREATININE 1.70* 2.03* 2.04*  CALCIUM 8.0* 7.7* 7.8*  MG 2.0 1.9 2.0  PHOS 1.1* 4.4 4.5  GLUCOSE 149* 144* 151*    CBG (last 3)   Recent Labs  11/28/13 2006 11/29/13 0009 11/29/13 0805  GLUCAP 123* 119* 116*    Scheduled Meds: . antiseptic oral rinse  15 mL  Mouth Rinse QID  . aspirin  81 mg Oral Daily  . chlorhexidine  15 mL Mouth Rinse BID  . hydrocortisone sodium succinate  50 mg Intravenous 4 times per day  . impella catheter heparin 50 unit/mL  25,000 Units Intracatheter Q24H  . insulin aspart  0-15 Units Subcutaneous 6 times per day  . pantoprazole (PROTONIX) IV  40 mg Intravenous Q24H  . phenytoin (DILANTIN) IV  100 mg Intravenous 3 times per day  . piperacillin-tazobactam (ZOSYN)  IV  3.375 g Intravenous Q8H  . ticagrelor  90 mg Oral BID    Continuous Infusions: . sodium chloride 20 mL/hr (11/27/13 0139)  . sodium chloride 125 mL/hr at 11/27/13 1653  . DOPamine 7.25 mcg/kg/min (11/29/13 0700)  . feeding supplement (VITAL AF 1.2 CAL)    . fentaNYL infusion INTRAVENOUS 300 mcg/hr (11/29/13 0750)  . heparin 400 Units/hr (11/29/13 0300)  . midazolam (VERSED) infusion 6 mg/hr (11/29/13 0950)    Brynda Greathouse, MS RD LDN Clinical Inpatient Dietitian Pager: [phone] Weekend/After hours pager: [phone]

## 2013-11-29 NOTE — Progress Notes (Signed)
Orthopedic Tech Progress Note Patient Details:  Gilbert [name] [birth year] [id number] Replacement KI ordered and left in room for application after procedure Ortho Devices Type of Ortho Device: Knee Immobilizer Ortho Device/Splint Location: applied to left lower extremity Ortho Device/Splint Interventions: Ordered   Asia R Thompson 11/29/2013, 11:59 AM

## 2013-11-29 NOTE — Progress Notes (Signed)
At 1415, patient spontaneously attempted to sit up in bed, reaching bilateral arms in air towards ET tube. PRN Versed and Fentanyl given per order, see MAR for documentation details. After instructing patient to lie back and attempting to reorient patient, bilateral groin vascular sites checked and a palpable hematoma >3cm was noted in the left groin with small amount sanguineous drainage noted. Manual pressure applied x20 minutes and dissipation of hematoma noted after pressure applied. Dressing changed, no further drainage noted.  Left DP and PT pulses noted to be weak but present via doppler. Right femoral vascular access site and pulses remain unchanged. Will continue to assess and monitor patient closely.   Nabilah Davoli Acre, RN, BSN, CCRN 11/29/2013 2:52 PM

## 2013-11-29 NOTE — Progress Notes (Signed)
Dr. Einar Gip notified of left femoral hematoma after patient attempted to sit up in bed, aware hematoma now less than 3cm after pressure applied. Also aware left lower extremity arterial doppler study completed per order. Orders received. Will continue to assess and monitor patient closely.

## 2013-11-29 NOTE — Progress Notes (Signed)
PULMONARY / CRITICAL CARE MEDICINE   Name: Gilbert [name] MRN: [id number] DOB: Oct 15, 1950    ADMISSION DATE:  11/26/2013 CONSULTATION DATE:  11/29/2013  REFERRING MD : EDP  PRIMARY SERVICE: PCCM   CHIEF COMPLAINT: Cardiac arrest.   BRIEF PATIENT DESCRIPTION: 63 yo with distant history of seizure disorder ( last seizure 10 years ago, on dilantin ) found unresponsive in recliner. VT / VF >>> shock >>> PEA arrest.   SIGNIFICANT EVENTS / STUDIES:  4/27 VT/VF/PEA arrest  [place] 19, 2024 CT head >>> nad  12-19-2022 CTA chest >>> No embolism, bilateral airspace disease  12/19/22 TTE >>> EF 25% to 30%. There is anterior, anteroseptal, apical and inferoapical hypokinesis  2022-12-19 TTE post cath>>> LV severely depressed with global hypokinesis and anterior/anteroseptal dyskinesis.  12/19/22 EEG>>> Normal asleep EEG  [place] 19, 2024 Cardiogenic shock and emergency cath>>>DES stents to Left main and proximal LAD, Insertion of Impella CP Device  4/29 TTE>>> EF 20% to 25%. LV Diffuse hypokinesis   LINES / TUBES:  OETT 4/27 >>>  OGT 4/27 >>>  L IJ CVL Aug[place] 19, 2024 >>>  RF AL Stickels 19, 2024 >>>  Foley Barhorst 19, 2024 >>>  R Femoral sheath 2022-12-19 >>>   CULTURES:  12/19/22 MRSA PCR >>> neg  12-19-2022 Blood >>>  12-19-2022 Urine >>> neg  12-19-2022 C diff>>>neg 4/29 Sputum >>>   ANTIBIOTICS:  Zosyn Reznik 19, 2024 >>>  Vanc Peloquin 19, 2024 >>> 4/29  INTERVAL HISTORY:  Still on IMPELLA per cardiology. Required additional PRN sedation overnight.   VITAL SIGNS: Temp:  [98.1 F (36.7 C)-100.4 F (38 C)] 98.1 F (36.7 C) (04/30 0600) Pulse Rate:  [68-111] 68 (04/30 0600) Resp:  [16-30] 16 (04/30 0600) BP: (72-115)/(58-84) 108/84 mmHg (04/30 0600) SpO2:  [95 %-100 %] 98 % (04/30 0600) Arterial Line BP: (74-119)/(52-84) 117/82 mmHg (04/30 0600) FiO2 (%):  [40 %-60 %] 40 % (04/30 0409) Weight:  [199 lb 1.2 oz (90.3 kg)] 199 lb 1.2 oz (90.3 kg) (04/30 0500)  HEMODYNAMICS: CVP:  [7 mmHg-12 mmHg] 12 mmHg  VENTILATOR SETTINGS: Vent Mode:  [-] PRVC FiO2 (%):  [40 %-60 %] 40 % Set Rate:  [16 bmp-30  bmp] 16 bmp Vt Set:  [550 mL] 550 mL PEEP:  [5 cmH20] 5 cmH20 Plateau Pressure:  [14 cmH20-18 cmH20] 18 cmH20  INTAKE / OUTPUT: Intake/Output     04/29 0701 - 04/30 0700   I.V. (mL/kg) 1545 (17.1)   Other 376.7   NG/GT 100   IV Piggyback 660   Total Intake(mL/kg) 2681.7 (29.7)   Urine (mL/kg/hr) 715 (0.3)   Total Output 715   Net +1966.7         PHYSICAL EXAMINATION: General: Appears acutely ill, mechanically ventilated, synchronous  Neuro: Encephalopathic, nonfocal, cough / gag diminished  HEENT: PERRL, OETT / OGT  Cardiovascular: RRR, no m/r/g  Lungs: Bilateral diminished air entry, no w/r/r  Abdomen: Soft, nontender, bowel sounds diminished  Musculoskeletal: no edema  Skin: Intact  LABS:  CBC  Recent Labs Lab 12-18-13 1655 11/28/13 0400 11/29/13 0440  WBC 31.4* 15.6* 15.3*  HGB 12.7* 10.2* 9.6*  HCT 38.0* 29.3* 28.4*  PLT 274 115* 107*   Coag's  Recent Labs Lab 11/26/13 2345 11/28/13 0800  APTT 35 90*  INR 1.15 1.78*   BMET  Recent Labs Lab 11/28/13 0400 11/28/13 1700 11/29/13 0440  NA 139 138 138  K 3.4* 3.8 3.6*  CL 102 99 99  CO2 23 25 25   BUN 24* 25* 28*  CREATININE 1.70* 2.03* 2.04*  GLUCOSE 149* 144* 151*  Electrolytes  Recent Labs Lab 11/28/13 0400 11/28/13 1700 11/29/13 0440  CALCIUM 8.0* 7.7* 7.8*  MG 2.0 1.9 2.0  PHOS 1.1* 4.4 4.5   Sepsis Markers  Recent Labs Lab 11/27/13 0150 11/27/13 0737  11/27/13 2108 11/28/13 0742 11/29/13 0440  LATICACIDVEN 3.3*  --   < > 4.5* 2.6* 1.2  PROCALCITON  --  10.79  --   --   --  23.84  < > = values in this interval not displayed. ABG  Recent Labs Lab 11/28/13 1000 11/28/13 1140 11/29/13 0427  PHART 7.543* 7.441 7.443  PCO2ART 31.6* 41.5 36.6  PO2ART 87.3 79.7* 72.0*   Liver Enzymes  Recent Labs Lab 11/26/13 2345 11/28/13 0400 11/29/13 0440  AST 347* 2068* 595*  ALT 201* 1165* 990*  ALKPHOS 270* 123* 112  BILITOT 0.4 0.4 0.3  ALBUMIN 3.1* 2.0* 2.0*    Cardiac Enzymes  Recent Labs Lab 11/27/13 0206 11/27/13 0806 11/27/13 1406  TROPONINI 7.92* >20.00* >20.00*   Glucose  Recent Labs Lab 11/28/13 0407 11/28/13 0741 11/28/13 1144 11/28/13 1521 11/28/13 2006 11/29/13 0009  GLUCAP 144* 114* 161* 142* 123* 119*   IMAGING:   Dg Chest Port 1 View  11/28/2013   CLINICAL DATA:  assess airspace  EXAM: PORTABLE CHEST - 1 VIEW  COMPARISON:  DG CHEST 1V PORT dated 11/27/2013  FINDINGS: Low lung volumes. Cardiac silhouette is enlarged. An endotracheal tube tip 5 point cm above the carina. A left internal jugular catheter tip at the level superior vena cava. NG tube tip not viewed on this study. There is diffuse prominence of interstitial markings, peribronchial cuffing, mild area of increased density right lung base and minimal blunting of the right costophrenic angle. No acute osseous abnormalities.  IMPRESSION: Pulmonary edema  Support lines and tubes is adequately positioned.  Likely atelectasis right lung base and possibly a trace right effusion   Electronically Signed   By: Margaree Mackintosh M.D.   On: 11/28/2013 07:39   Dg Chest Port 1 View  11/27/2013   CLINICAL DATA:  Cardiac arrest, intubation/on ventilator, question pneumothorax  EXAM: PORTABLE CHEST - 1 VIEW  COMPARISON:  Portable exam 1637 hr compared to 0137 hr  FINDINGS: Tip of endotracheal tube projects 6.0 cm above carinal.  Nasogastric tube extends into stomach.  LEFT jugular central venous catheter tip projects over SVC.  Normal heart size, mediastinal contours and pulmonary vascularity for technique.  Bibasilar atelectasis.  No definite infiltrate, pleural effusion, or pneumothorax.  No acute osseous findings.  IMPRESSION: Bibasilar atelectasis.   Electronically Signed   By: Lavonia Dana M.D.   On: 11/27/2013 16:56   Dg Abd Portable 1v  11/28/2013   CLINICAL DATA:  OG placement  EXAM: PORTABLE ABDOMEN - 1 VIEW  COMPARISON:  None.  FINDINGS: Nasogastric tube tip projects in the  gastric antrum. Overall paucity of bowel gas. Contrast excretion in the kidneys and contrast accumulation within the urinary bladder which contains a Foley catheter. Soft tissue planes and included osseous structures are nonsuspicious. Possible right femoral central venous catheter.  IMPRESSION: Nasogastric tube tip projects and gastric antrum.   Electronically Signed   By: Elon Alas   On: 11/28/2013 06:49   ASSESSMENT / PLAN:  PULMONARY  A:  Acute respiratory failure  Acute pulmonary edema in setting of cardiogenic shock  Aspiration pneumonia / pneumonitis  P:  Goal pH>7.30, SpO2>92  Continuous mechanical support  VAP bundle  Daily SBT  Keep intubated until Impella is out  Trend  ABG/CXR   CARDIOVASCULAR  A:  VT/VF/PEA arrest  NSTEMI  Critical left main and LAD stenosis s/p DES placement  Cardiogenic shock s/p Impella placement, lactate trend reassuring Heavy sedation and positive pressure ventilation are probably contributing to hypotension  NSVT in setting of hypomagnesemia  P:  MAP goal > 70  ASA, Brilinta, Aggrastat per cardiology  Mechanical circulatory support with Impella, d/c? Dopamine gtt, titrate down as tolerated  BB/ACEI contraindicated - shock  Statin contraindicated - elevated transaminases   RENAL  A:  AKI, ? ATN  Hypocalcemia  Hypokalemia  Renal carcinoma s/p nephrectomy  Metabolic acidosis, resolved  P:  Goal CVP 10-12  Goal K 4, Mg 2  Follow BMP/Mg/Phos Q12  K 20  GASTROINTESTINAL  A:  Shocked liver, improving GI Px  Nutrition  P:  Protonix  NPO  Hold TF as has to stay flat for Implella and at risk for aspiration  Trend LFT   HEMATOLOGIC  A:  Mild normocytic anemia  Mild thrombocytopenia, likely associated heparin, aggrastat Acute coagulopathy 2/2 shock liver Heparinization for IMPELLA protocol  VTE Px is not indicated  P:  Trend CBC  Heparin gtt   INFECTIOUS  A:  Possible aspiration pneumonia  C. Dif ruled out  P:   Abx/cx as above   ENDOCRINE  A:  Hyperglycemia  Possible relative adrenal insufficiency  P:  SSI  Consider to stop stress steroids when off stressors   NEUROLOGIC  A:  Acute encephalopathy  H/o seizures, last 10 years ago, on Dilantin, sub therapeutic  P:  No role for therapeutic hypothermia - unknown down time, purposeful movements post-resuscitation  Normothermia  Dilantin  Fentanyl / Versed gtt  Charlann Lange, MD PGY-3  IM[facility]  11/29/2013, 6:46 AM  I have personally obtained history, examined patient, evaluated and interpreted laboratory and imaging results, reviewed medical records, formulated assessment / plan and placed orders.  CRITICAL CARE:  The patient is critically ill with multiple organ systems failure and requires high complexity decision making for assessment and support, frequent evaluation and titration of therapies, application of advanced monitoring technologies and extensive interpretation of multiple databases. Critical Care Time devoted to patient care services described in this note is 35 minutes.   Doree Fudge, MD Pulmonary and [place] Pager: [phone]  11/29/2013, 7:05 AM

## 2013-11-29 NOTE — Progress Notes (Signed)
Patient was source for blood exposure to staff member.  Per hospital policy blood was drawn for exposure panel. 

## 2013-11-29 NOTE — Progress Notes (Signed)
Right femoral sheath removed.  Pressure held for 30 minutes.  Site with small amount of bruising noted.  Vital signs remained stable throughout procedure.

## 2013-11-30 ENCOUNTER — Inpatient Hospital Stay (HOSPITAL_COMMUNITY): Payer: BC Managed Care – PPO

## 2013-11-30 DIAGNOSIS — Z905 Acquired absence of kidney: Secondary | ICD-10-CM

## 2013-11-30 HISTORY — PX: NEPHRECTOMY: SHX65

## 2013-11-30 HISTORY — DX: Acquired absence of kidney: Z90.5

## 2013-11-30 LAB — COMPREHENSIVE METABOLIC PANEL
ALT: 829 U/L — ABNORMAL HIGH (ref 0–53)
AST: 301 U/L — ABNORMAL HIGH (ref 0–37)
Albumin: 2 g/dL — ABNORMAL LOW (ref 3.5–5.2)
Alkaline Phosphatase: 112 U/L (ref 39–117)
BUN: 30 mg/dL — ABNORMAL HIGH (ref 6–23)
CO2: 25 mEq/L (ref 19–32)
Calcium: 8 mg/dL — ABNORMAL LOW (ref 8.4–10.5)
Chloride: 102 mEq/L (ref 96–112)
Creatinine, Ser: 1.59 mg/dL — ABNORMAL HIGH (ref 0.50–1.35)
GFR calc Af Amer: 52 mL/min — ABNORMAL LOW (ref >90)
GFR calc non Af Amer: 45 mL/min — ABNORMAL LOW (ref >90)
Glucose, Bld: 129 mg/dL — ABNORMAL HIGH (ref 70–99)
Potassium: 3.9 mEq/L (ref 3.7–5.3)
Sodium: 140 mEq/L (ref 137–147)
Total Bilirubin: 0.4 mg/dL (ref 0.3–1.2)
Total Protein: 5.2 g/dL — ABNORMAL LOW (ref 6.0–8.3)

## 2013-11-30 LAB — CULTURE, RESPIRATORY

## 2013-11-30 LAB — BLOOD GAS, ARTERIAL
ACID-BASE DEFICIT: 0.5 mmol/L (ref 0.0–2.0)
Bicarbonate: 23.8 mEq/L (ref 20.0–24.0)
DRAWN BY: 31276
FIO2: 0.4 %
LHR: 16 {breaths}/min
MECHVT: 550 mL
O2 Saturation: 91 %
PCO2 ART: 40.5 mmHg (ref 35.0–45.0)
PEEP: 5 cmH2O
PO2 ART: 64.4 mmHg — AB (ref 80.0–100.0)
Patient temperature: 98.6
TCO2: 25.1 mmol/L (ref 0–100)
pH, Arterial: 7.388 (ref 7.350–7.450)

## 2013-11-30 LAB — MAGNESIUM: Magnesium: 1.9 mg/dL (ref 1.5–2.5)

## 2013-11-30 LAB — GLUCOSE, CAPILLARY
GLUCOSE-CAPILLARY: 98 mg/dL (ref 70–99)
Glucose-Capillary: 111 mg/dL — ABNORMAL HIGH (ref 70–99)
Glucose-Capillary: 104 mg/dL — ABNORMAL HIGH (ref 70–99)
Glucose-Capillary: 115 mg/dL — ABNORMAL HIGH (ref 70–99)
Glucose-Capillary: 96 mg/dL (ref 70–99)

## 2013-11-30 LAB — CBC
HCT: 27.6 % — ABNORMAL LOW (ref 39.0–52.0)
Hemoglobin: 9.5 g/dL — ABNORMAL LOW (ref 13.0–17.0)
MCH: 28.4 pg (ref 26.0–34.0)
MCHC: 34.4 g/dL (ref 30.0–36.0)
MCV: 82.4 fL (ref 78.0–100.0)
PLATELETS: 114 10*3/uL — AB (ref 150–400)
RBC: 3.35 MIL/uL — AB (ref 4.22–5.81)
RDW: 14.7 % (ref 11.5–15.5)
WBC: 14.5 10*3/uL — AB (ref 4.0–10.5)

## 2013-11-30 LAB — LIPID PANEL
CHOL/HDL RATIO: 4.6 ratio
CHOLESTEROL: 137 mg/dL (ref 0–200)
HDL: 30 mg/dL — ABNORMAL LOW (ref >39)
LDL CALC: 74 mg/dL (ref 0–99)
Triglycerides: 167 mg/dL — ABNORMAL HIGH (ref <150)
VLDL: 33 mg/dL (ref 0–40)

## 2013-11-30 LAB — PHOSPHORUS
PHOSPHORUS: 4.2 mg/dL (ref 2.3–4.6)
Phosphorus: 6.5 mg/dL — ABNORMAL HIGH (ref 2.3–4.6)

## 2013-11-30 LAB — CULTURE, RESPIRATORY W GRAM STAIN: Special Requests: NORMAL

## 2013-11-30 LAB — LACTIC ACID, PLASMA: LACTIC ACID, VENOUS: 1.2 mmol/L (ref 0.5–2.2)

## 2013-11-30 LAB — PHENYTOIN LEVEL, TOTAL: Phenytoin Lvl: 8.3 ug/mL — ABNORMAL LOW (ref 10.0–20.0)

## 2013-11-30 MED ORDER — HYDROCORTISONE NA SUCCINATE PF 100 MG IJ SOLR
50.0000 mg | Freq: Two times a day (BID) | INTRAMUSCULAR | Status: DC
  Administered 2013-11-30 – 2013-12-01 (×2): 50 mg via INTRAVENOUS
  Filled 2013-11-30 (×4): qty 1

## 2013-11-30 MED ORDER — PHENYTOIN SODIUM EXTENDED 100 MG PO CAPS
300.0000 mg | ORAL_CAPSULE | Freq: Every day | ORAL | Status: DC
  Administered 2013-12-01 – 2013-12-06 (×6): 300 mg via ORAL
  Filled 2013-11-30 (×7): qty 3

## 2013-11-30 MED ORDER — PANTOPRAZOLE SODIUM 40 MG IV SOLR
40.0000 mg | INTRAVENOUS | Status: DC
  Filled 2013-11-30: qty 40

## 2013-11-30 NOTE — Progress Notes (Signed)
Pharmacy Consult Note  Pharmacy Consult for zosyn/dilantin Indication: Aspiration/Sz disorder  No Known Allergies  Patient Measurements: Height: 5\' 8"  (172.7 cm) Weight: 199 lb 4.7 oz (90.4 kg) (no artic sun pad present ) IBW/kg (Calculated) : 68.4  Vital Signs: Temp: 96.8 F (36 C) (05/01 0800) Temp src: Core (Comment) (05/01 0800) BP: 103/79 mmHg (05/01 0800) Pulse Rate: 68 (05/01 0800) Intake/Output from previous day: 04/30 0701 - 05/01 0700 In: 2059.3 [I.V.:1693.3; NG/GT:250; IV Piggyback:100] Out: 1709 [Urine:1659; Stool:50] Intake/Output from this shift: Total I/O In: 53.7 [I.V.:41.2; IV Piggyback:12.5] Out: 30 [Urine:30]  Labs:  Recent Labs  11/28/13 0400  11/29/13 0440 11/29/13 1500 11/30/13 0435  WBC 15.6*  --  15.3*  --  14.5*  HGB 10.2*  --  9.6* 9.3* 9.5*  PLT 115*  --  107*  --  114*  CREATININE 1.70*  < > 2.04* 1.82* 1.59*  < > = values in this interval not displayed. Estimated Creatinine Clearance: 51.9 ml/min (by C-G formula based on Cr of 1.59). No results found for this basename: VANCOTROUGH, Corlis Leak, VANCORANDOM, Loda, GENTPEAK, GENTRANDOM, TOBRATROUGH, TOBRAPEAK, TOBRARND, AMIKACINPEAK, AMIKACINTROU, AMIKACIN,  in the last 72 hours   Microbiology: Recent Results (from the past 720 hour(s))  MRSA PCR SCREENING     Status: None   Collection Time    11/27/13  2:09 AM      Result Value Ref Range Status   MRSA by PCR NEGATIVE  NEGATIVE Final   Comment:            The GeneXpert MRSA Assay (FDA     approved for NASAL specimens     only), is one component of a     comprehensive MRSA colonization     surveillance program. It is not     intended to diagnose MRSA     infection nor to guide or     monitor treatment for     MRSA infections.  CULTURE, BLOOD (ROUTINE X 2)     Status: None   Collection Time    11/27/13  2:29 AM      Result Value Ref Range Status   Specimen Description BLOOD CENTRAL LINE   Final   Special Requests BOTTLES  DRAWN AEROBIC ONLY 5CC   Final   Culture  Setup Time     Final   Value: 11/27/2013 09:37     Performed at [facility]   Culture     Final   Value:        BLOOD CULTURE RECEIVED NO GROWTH TO DATE CULTURE WILL BE HELD FOR 5 DAYS BEFORE ISSUING A FINAL NEGATIVE REPORT     Performed at [facility]   Report Status PENDING   Incomplete  URINE CULTURE     Status: None   Collection Time    11/27/13  3:24 AM      Result Value Ref Range Status   Specimen Description URINE, CATHETERIZED   Final   Special Requests NONE   Final   Culture  Setup Time     Final   Value: 11/27/2013 03:30     Performed at [facility]     Final   Value: NO GROWTH     Performed at [facility]   Culture     Final   Value: NO GROWTH     Performed at [facility]   Report Status 11/28/2013 FINAL   Final  CULTURE, BLOOD (ROUTINE  X 2)     Status: None   Collection Time    11/27/13  3:35 AM      Result Value Ref Range Status   Specimen Description BLOOD LEFT UPPER ARM   Final   Special Requests BOTTLES DRAWN AEROBIC ONLY 10CC   Final   Culture  Setup Time     Final   Value: 11/27/2013 09:35     Performed at [facility]   Culture     Final   Value:        BLOOD CULTURE RECEIVED NO GROWTH TO DATE CULTURE WILL BE HELD FOR 5 DAYS BEFORE ISSUING A FINAL NEGATIVE REPORT     Performed at [facility]   Report Status PENDING   Incomplete  CLOSTRIDIUM DIFFICILE BY PCR     Status: None   Collection Time    11/27/13  6:06 AM      Result Value Ref Range Status   C difficile by pcr NEGATIVE  NEGATIVE Final  CULTURE, RESPIRATORY (NON-EXPECTORATED)     Status: None   Collection Time    11/28/13 11:59 AM      Result Value Ref Range Status   Specimen Description TRACHEAL ASPIRATE   Final   Special Requests Normal   Final   Gram Stain     Final   Value: ABUNDANT WBC PRESENT,BOTH PMN AND MONONUCLEAR     NO SQUAMOUS EPITHELIAL CELLS SEEN     FEW GRAM  POSITIVE COCCI     IN PAIRS     Performed at [facility]   Culture     Final   Value: Non-Pathogenic Oropharyngeal-type Flora Isolated.     Performed at [facility]   Report Status 11/30/2013 FINAL   Final    Anti-infectives   Start     Dose/Rate Route Frequency Ordered Stop   11/28/13 1800  vancomycin (VANCOCIN) 1,250 mg in sodium chloride 0.9 % 250 mL IVPB  Status:  Discontinued     1,250 mg 166.7 mL/hr over 90 Minutes Intravenous Every 24 hours 11/28/13 0831 11/28/13 0837   11/27/13 1200  vancomycin (VANCOCIN) IVPB 1000 mg/200 mL premix  Status:  Discontinued     1,000 mg 200 mL/hr over 60 Minutes Intravenous Every 12 hours 11/27/13 0230 11/28/13 0831   11/27/13 0800  piperacillin-tazobactam (ZOSYN) IVPB 3.375 g     3.375 g 12.5 mL/hr over 240 Minutes Intravenous Every 8 hours 11/27/13 0230     11/27/13 0245  vancomycin (VANCOCIN) IVPB 1000 mg/200 mL premix     1,000 mg 200 mL/hr over 60 Minutes Intravenous  Once 11/27/13 0230 11/27/13 0452   11/27/13 0230  piperacillin-tazobactam (ZOSYN) IVPB 3.375 g     3.375 g 100 mL/hr over 30 Minutes Intravenous  Once 11/27/13 0230 11/27/13 0330      Assessment: Possible aspiration from cardiac arrest - no fevers noted, wbc trending down to 14.5. Renal function improving. No dose adjustments warranted at this time.   CULTURES:  4/28 MRSA PCR >>> neg  4/28 Blood >>>  4/28 Urine >>> neg  4/28 C diff>>>neg  4/29 Sputum >>> neg  ANTIBIOTICS:  Zosyn 4/28 >>>  Vanc 4/28 >>> 4/29  Seizure d/o (no sz noted in over 10y): dilantin level subtherapeutic on admission, patient was reloaded and home dose continued by IV route. Level checked this am was within goal range considering renal function and malnutrition with albumin of 2. Level resulted at 8.3 corrected to 16. No  new seizures noted. Extubated this am will change to po tomorrow. Will order follow up level as deemed necessary.  Goal of Therapy:  Phenytoin level  10-20  Plan:  Continue zosyn 3.375g q8 h for now Continue IV phenytoin today - change back to 300mg  po daily tomorrow  Georgina Peer 11/30/2013,8:31 AM

## 2013-11-30 NOTE — Progress Notes (Signed)
200 ml of fentanyl and 75ml of versed wasted in sink with [place] Zayed Griffie, RN

## 2013-11-30 NOTE — Progress Notes (Signed)
PULMONARY / CRITICAL CARE MEDICINE  Name: Gilbert [name] MRN: [id number] DOB: March 17, 1951    ADMISSION DATE:  11/26/2013 CONSULTATION DATE:  11/29/2013  REFERRING MD : EDP  PRIMARY SERVICE: PCCM   CHIEF COMPLAINT: Cardiac arrest.   BRIEF PATIENT DESCRIPTION: 63 yo with distant history of seizure disorder ( last seizure 10 years ago, on dilantin ) found unresponsive in recliner. VT / VF >>> shock >>> PEA arrest.   SIGNIFICANT EVENTS / STUDIES:  4/27 VT/VF/PEA arrest  21-Navia-2024 CT head >>> nad  12/21/22 CTA chest >>> No embolism, bilateral airspace disease  21-Mackiewicz-2024 TTE >>> EF 25% to 30%. There is anterior, anteroseptal, apical and inferoapical hypokinesis  12-21-2022 TTE post cath>>> LV severely depressed with global hypokinesis and anterior/anteroseptal dyskinesis.  2022/12/21 EEG>>> Normal asleep EEG  12-21-2022 Cardiogenic shock and emergency cath>>>DES stents to Left main and proximal LAD, Insertion of Impella CP Device  4/29 TTE>>> EF 20% to 25%. LV Diffuse hypokinesis 4/30 Impella out   LINES / TUBES:  OETT 4/27 >>> 5/1  OGT 4/27 >>>  5/1 L IJ CVL 2022-12-21 >>>  RF AL 12/21/22 >>>  Foley Osterberg 21, 2024 >>>  R Femoral sheath 12-21-2022 >>> 4/29  CULTURES:  12/21/22 MRSA PCR >>> neg  12/21/2022 Blood >>>  21-Angelica-2024 Urine >>> neg  12-21-2022 C diff>>>neg 4/29 Sputum >>> neg  ANTIBIOTICS:  Zosyn 2022/12/21 >>>  Vanc 12/21/2022 >>> 4/29  INTERVAL HISTORY:  Impella out.  Tolerating SBT well.  Neurologically intact.  On minimal vasopressors.  VITAL SIGNS: Temp:  [96.8 F (36 C)-98.8 F (37.1 C)] 97 F (36.1 C) (05/01 0900) Pulse Rate:  [55-89] 60 (05/01 0900) Resp:  [0-18] 16 (05/01 0900) BP: (97-150)/(56-83) 124/65 mmHg (05/01 0900) SpO2:  [93 %-100 %] 97 % (05/01 0900) FiO2 (%):  [40 %] 40 % (05/01 0800) Weight:  [90.4 kg (199 lb 4.7 oz)] 90.4 kg (199 lb 4.7 oz) (05/01 0500)  HEMODYNAMICS: CVP:  [7 mmHg-13 mmHg] 8 mmHg  VENTILATOR SETTINGS: Vent Mode:  [-] PRVC FiO2 (%):  [40 %] 40 % Set Rate:  [16 bmp] 16 bmp Vt Set:  [550 mL] 550 mL PEEP:   [5 cmH20] 5 cmH20 Plateau Pressure:  [15 cmH20-17 cmH20] 17 cmH20  INTAKE / OUTPUT: Intake/Output     04/30 0701 - 05/01 0700 05/01 0701 - 05/02 0700   I.V. (mL/kg) 1693.3 (18.7) 41.2 (0.5)   Other 16    NG/GT 250    IV Piggyback 100 12.5   Total Intake(mL/kg) 2059.3 (22.8) 53.7 (0.6)   Urine (mL/kg/hr) 1659 (0.8) 30 (0.1)   Stool 50 (0)    Total Output 1709 30   Net +350.3 +23.7          PHYSICAL EXAMINATION: General: Appropriate tidal volumes on SBT, no distress Neuro: Awake, alert, following commands, strong gag / cough HEENT: PERRL, OETT / OGT  Cardiovascular: RRR, no m/r/g  Lungs: Bilateral diminished air entry Abdomen: Soft, nontender, bowel sounds diminished  Musculoskeletal: No edema  Skin: Groin hematoma  LABS:  CBC  Recent Labs Lab 11/28/13 0400 11/29/13 0440 11/29/13 1500 11/30/13 0435  WBC 15.6* 15.3*  --  14.5*  HGB 10.2* 9.6* 9.3* 9.5*  HCT 29.3* 28.4* 27.8* 27.6*  PLT 115* 107*  --  114*   Coag's  Recent Labs Lab 11/26/13 2345 11/28/13 0800  APTT 35 90*  INR 1.15 1.78*   BMET  Recent Labs Lab 11/29/13 0440 11/29/13 1500 11/30/13 0435  NA 138 138 140  K 3.6* 3.8  3.9  CL 99 100 102  CO2 25 25 25   BUN 28* 29* 30*  CREATININE 2.04* 1.82* 1.59*  GLUCOSE 151* 133* 129*   Electrolytes  Recent Labs Lab 11/29/13 0440 11/29/13 1500 11/30/13 0435  CALCIUM 7.8* 7.9* 8.0*  MG 2.0 1.8 1.9  PHOS 4.5 4.4 4.2   Sepsis Markers  Recent Labs Lab 11/27/13 0150 11/27/13 0737  11/28/13 0742 11/29/13 0440 11/30/13 0435  LATICACIDVEN 3.3*  --   < > 2.6* 1.2 1.2  PROCALCITON  --  10.79  --   --  23.84  --   < > = values in this interval not displayed. ABG  Recent Labs Lab 11/28/13 1140 11/29/13 0427 11/30/13 0341  PHART 7.430 7.443 7.388  PCO2ART 43.0 36.6 40.5  PO2ART 83.5 72.0* 64.4*   Liver Enzymes  Recent Labs Lab 11/28/13 0400 11/29/13 0440 11/30/13 0435  AST 2068* 595* 301*  ALT 1165* 990* 829*  ALKPHOS 123* 112  112  BILITOT 0.4 0.3 0.4  ALBUMIN 2.0* 2.0* 2.0*   Cardiac Enzymes  Recent Labs Lab 11/29/13 0900 11/29/13 1500 11/29/13 2145  TROPONINI >20.00* >20.00* >20.00*   Glucose  Recent Labs Lab 11/29/13 0805 11/29/13 1157 11/29/13 1513 11/29/13 2004 11/29/13 2356 11/30/13 0734  GLUCAP 116* 115* 139* 98 111* 104*   IMAGING:   Dg Chest Port 1 View  11/30/2013   CLINICAL DATA:  Respiratory failure; hypoxia  EXAM: PORTABLE CHEST - 1 VIEW  COMPARISON:  November 29, 2013  FINDINGS: Endotracheal tube tip is 4.5 cm above the carina. Central catheter tip is at the junction of the left innominate vein and superior vena cava. Nasogastric tube tip and side port are below the diaphragm. No pneumothorax.  There is consolidation in both lung bases with small effusions bilaterally. Heart is enlarged. The pulmonary vascularity is within normal limits. No adenopathy.  IMPRESSION: Cardiomegaly with effusions. Suspect congestive heart failure. There is consolidation in both lung bases, stable. No new opacity. Tube and catheter positions are as described without pneumothorax.   Electronically Signed   By: Lowella Grip M.D.   On: 11/30/2013 07:57   Dg Chest Port 1 View  11/29/2013   CLINICAL DATA:  Intubated, respiratory failure, shortness of breath  EXAM: PORTABLE CHEST - 1 VIEW  COMPARISON:  DG CHEST 1V PORT dated 11/28/2013; DG CHEST 1V PORT dated 11/26/2013; CT ANGIO CHEST W/CM &/OR WO/CM dated 11/27/2013; DG CHEST 1V PORT dated 11/27/2013  FINDINGS: Grossly unchanged enlarged cardiac silhouette and mediastinal contours. Stable position of support apparatus. No pneumothorax. Minimally improved aeration of the lungs with persistent cephalization of flow. Grossly unchanged small layering bilateral effusions with associated bibasilar heterogeneous/consolidative opacities. No new focal airspace opacities. Unchanged bones.  IMPRESSION: 1.  Stable positioning of support apparatus.  No pneumothorax. 2. Suspected  minimally improved pulmonary edema with persistent findings of pulmonary venous congestion 3. Unchanged small layering effusions and associated bibasilar opacities, atelectasis versus infiltrate.   Electronically Signed   By: Sandi Mariscal M.D.   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  Cardiogenic shock, resolving, on minimal pressors P:  MAP goal > 70  ASA, Brilinta Dopamine gtt, wean to off  BB/ACEI contraindicated - shock  Statin contraindicated - elevated transaminases   RENAL  A:  AKI, improving Renal carcinoma s/p nephrectomy  P:  D/c CVP Trend BMP  GASTROINTESTINAL  A:  Shocked liver, improving GI Px Nutrition  P:  D/c Protonix once off vasopressors D/c TF Bedside swallow evaluation and advance diet as tolerated  HEMATOLOGIC  A:  Anemia Thrombocytopenia Coagulopathy secondary to shocked liver VTE Px P:  Trend CBC  SCDs   INFECTIOUS  A:  Possible aspiration pneumonia  C. Dif ruled out  P:  Abx/cx as above   ENDOCRINE  A:  Hyperglycemia  Possible relative adrenal insufficiency  P:  SSI  Decrease Hydrocortisone to 50 q12h  NEUROLOGIC  A:  Acute encephalopathy  H/o seizures, last 10 years ago, on Dilantin, sub therapeutic  P:  Dilantin  D/c Fentanyl / Versed gtt  I have personally obtained history, examined patient, evaluated and interpreted laboratory and imaging results, reviewed medical records, formulated assessment / plan and placed orders.  CRITICAL CARE:  The patient is critically ill with multiple organ systems failure and requires high complexity decision making for assessment and support,  frequent evaluation and titration of therapies, application of advanced monitoring technologies and extensive interpretation of multiple databases. Critical Care Time devoted to patient care services described in this note is 35 minutes.   Doree Fudge, MD [facility] [place] Pager: [phone]  11/30/2013, 9:20 AM

## 2013-11-30 NOTE — Procedures (Signed)
Extubation Procedure Note  Patient Details:   Name: Gilbert [name] DOB: 1950/12/14 MRN: [id number]   Airway Documentation:   Pt extubated to 2L [place]. No stridor noted. Pt in some shock about the situation but resolving. BBS equal and diminshed. Pt able to vocalize name and cough on command.  Evaluation  O2 sats: stable throughout and currently acceptable Complications: No apparent complications Patient did tolerate procedure well. Bilateral Breath Sounds: Rhonchi Suctioning: Oral;Airway   Lissa Merlin 11/30/2013, 9:57 AM

## 2013-11-30 NOTE — Progress Notes (Signed)
Subjective:  Patient remains intubated but is awake and responds appropriately.   Requiring much lesser doses of inotropic support. On 7.5 mcg  Dopa. Urine output appears stable. Daughter at bedside.  Objective:  Vital Signs in the last 24 hours: Temp:  [96.3 F (35.7 C)-98.8 F (37.1 C)] 97.7 F (36.5 C) (05/01 1600) Pulse Rate:  [55-88] 85 (05/01 1800) Resp:  [10-28] 28 (05/01 1800) BP: (103-168)/(56-94) 168/79 mmHg (05/01 1800) SpO2:  [95 %-100 %] 97 % (05/01 1800) FiO2 (%):  [40 %] 40 % (05/01 0800) Weight:  [90.4 kg (199 lb 4.7 oz)] 90.4 kg (199 lb 4.7 oz) (05/01 0500)  Intake/Output from previous day: 04/30 0701 - 05/01 0700 In: 2059.3 [I.V.:1693.3; NG/GT:250; IV Piggyback:100] Out: 1709 [Urine:1659; Stool:50]  Physical Exam:   General appearance: patient is intubated and sedated. responds appropriately to verbal commands.  Eyes: negative findings: lids and lashes normal Neck: no adenopathy and no carotid bruit Neck: JVP - normal, carotids 2+= without bruits Resp: clear to auscultation bilaterally Cardio: regular rate and rhythm, S1, S2 normal, no murmur, click, rub or gallop, distant heart sounds. GI: soft, non-tender; bowel sounds normal; no masses,  no organomegaly Extremities: extremities normal, atraumatic, no cyanosis or edema and the extremities are cool, however there is no clinical evidence of arterial insufficiency.  Bilateral groin sites without any hematoma. The left foot (only foot) is colder than the right and the rest of the leg is warm.    Lab Results: BMP  Recent Labs  11/29/13 0440 11/29/13 1500 11/30/13 0435  NA 138 138 140  K 3.6* 3.8 3.9  CL 99 100 102  CO2 25 25 25   GLUCOSE 151* 133* 129*  BUN 28* 29* 30*  CREATININE 2.04* 1.82* 1.59*  CALCIUM 7.8* 7.9* 8.0*  GFRNONAA 33* 38* 45*  GFRAA 38* 44* 52*    CBC  Recent Labs Lab 11/30/13 0435  WBC 14.5*  RBC 3.35*  HGB 9.5*  HCT 27.6*  PLT 114*  MCV 82.4  MCH 28.4  MCHC 34.4   RDW 14.7    HEMOGLOBIN A1C No results found for this basename: HGBA1C,  MPG    Cardiac Panel (last 3 results)  Recent Labs  11/29/13 0900 11/29/13 1500 11/29/13 2145  TROPONINI >20.00* >20.00* >20.00*    BNP (last 3 results) No results found for this basename: PROBNP,  in the last 8760 hours  TSH  Recent Labs  11/27/13 0400  TSH 0.961    CHOLESTEROL  Recent Labs  11/30/13 0435  CHOL 137    Hepatic Function Panel  Recent Labs  11/28/13 0400 11/29/13 0440 11/30/13 0435  PROT 4.7* 5.0* 5.2*  ALBUMIN 2.0* 2.0* 2.0*  AST 2068* 595* 301*  ALT 1165* 990* 829*  ALKPHOS 123* 112 112  BILITOT 0.4 0.3 0.4  BILIDIR  --  <0.2  --   IBILI  --  NOT CALCULATED  --     Cardiac Studies: EKG 11/28/2013: Sinus tachycardia at the rate of 104 bpm, rightward axis, left posterior fascicular block, right bundle branch block.  Nonspecific T abnormality.  Low voltage complexes. EKG 11/29/2013: Normal sinus rhythm, nonspecific T abnormality.  Assessment/Plan:  1.  Cardiogenic shock secondary to multilevel vessel critical coronary artery disease. 2.  CAD of the native vessels, successful PTCA and stenting of the left main, proximal LAD with implantation of a 4.0 x 18 mm Xience alpine stent. Abdominal aortogram, limited bilateral femoral arteriogram to evaluate for PAD for insertion of Impella Circulatory assist  device. 3.  Multiorgan failure, acute renal failure, shock liver due to cardiac any shock.  Patient presently not on a beta blocker due to low blood pressure, not on a statin due to shocked liver. Overall improved blood chemistry.   4. NSTEMI  Rec: I am pleased with his progress and presently on Dopa with good hemodynamics and will continue to wean it off. Keep mean BP of 70-80 mm Hg. I have discussed with the daughter the need for long-term rehab as it appears for now. No change in cardiac meds for now. Appreciate the PCCM support and also d/w Dr. Orlinda Blalock who has  been very proactive.  [name], M.D. 11/30/2013, 6:57 PM [facility] Pager: [phone] Office: [phone] If no answer: [phone]

## 2013-12-01 LAB — GLUCOSE, CAPILLARY
GLUCOSE-CAPILLARY: 105 mg/dL — AB (ref 70–99)
Glucose-Capillary: 91 mg/dL (ref 70–99)
Glucose-Capillary: 87 mg/dL (ref 70–99)
Glucose-Capillary: 76 mg/dL (ref 70–99)

## 2013-12-01 LAB — CBC
HEMATOCRIT: 29.9 % — AB (ref 39.0–52.0)
Hemoglobin: 10.1 g/dL — ABNORMAL LOW (ref 13.0–17.0)
MCH: 27.7 pg (ref 26.0–34.0)
MCHC: 33.8 g/dL (ref 30.0–36.0)
MCV: 81.9 fL (ref 78.0–100.0)
PLATELETS: 130 10*3/uL — AB (ref 150–400)
RBC: 3.65 MIL/uL — ABNORMAL LOW (ref 4.22–5.81)
RDW: 14.6 % (ref 11.5–15.5)
WBC: 10.6 10*3/uL — AB (ref 4.0–10.5)

## 2013-12-01 LAB — BASIC METABOLIC PANEL
BUN: 29 mg/dL — AB (ref 6–23)
CALCIUM: 8 mg/dL — AB (ref 8.4–10.5)
CHLORIDE: 106 meq/L (ref 96–112)
CO2: 25 mEq/L (ref 19–32)
Creatinine, Ser: 1.45 mg/dL — ABNORMAL HIGH (ref 0.50–1.35)
GFR calc non Af Amer: 50 mL/min — ABNORMAL LOW (ref >90)
GFR, EST AFRICAN AMERICAN: 58 mL/min — AB (ref >90)
Glucose, Bld: 89 mg/dL (ref 70–99)
Potassium: 2.9 mEq/L — CL (ref 3.7–5.3)
Sodium: 142 mEq/L (ref 137–147)

## 2013-12-01 LAB — POTASSIUM: Potassium: 3.3 mEq/L — ABNORMAL LOW (ref 3.7–5.3)

## 2013-12-01 LAB — MAGNESIUM
MAGNESIUM: 2 mg/dL (ref 1.5–2.5)
MAGNESIUM: 1.8 mg/dL (ref 1.5–2.5)

## 2013-12-01 LAB — PHOSPHORUS: Phosphorus: 2.7 mg/dL (ref 2.3–4.6)

## 2013-12-01 MED ORDER — TRAMADOL HCL 50 MG PO TABS
50.0000 mg | ORAL_TABLET | Freq: Four times a day (QID) | ORAL | Status: DC | PRN
  Administered 2013-12-01 (×2): 50 mg via ORAL
  Filled 2013-12-01 (×2): qty 1

## 2013-12-01 MED ORDER — ATORVASTATIN CALCIUM 10 MG PO TABS
10.0000 mg | ORAL_TABLET | Freq: Every day | ORAL | Status: DC
  Administered 2013-12-01 – 2013-12-06 (×6): 10 mg via ORAL
  Filled 2013-12-01 (×8): qty 1

## 2013-12-01 MED ORDER — POTASSIUM CHLORIDE CRYS ER 20 MEQ PO TBCR
40.0000 meq | EXTENDED_RELEASE_TABLET | Freq: Once | ORAL | Status: AC
  Administered 2013-12-01: 40 meq via ORAL
  Filled 2013-12-01: qty 2

## 2013-12-01 MED ORDER — POTASSIUM CHLORIDE CRYS ER 20 MEQ PO TBCR
40.0000 meq | EXTENDED_RELEASE_TABLET | ORAL | Status: DC
  Administered 2013-12-01: 40 meq via ORAL
  Filled 2013-12-01: qty 2

## 2013-12-01 MED ORDER — POTASSIUM CHLORIDE CRYS ER 20 MEQ PO TBCR: 20.0000 meq | EXTENDED_RELEASE_TABLET | ORAL | Status: DC

## 2013-12-01 MED ORDER — BIOTENE DRY MOUTH MT LIQD
15.0000 mL | Freq: Two times a day (BID) | OROMUCOSAL | Status: DC
  Administered 2013-12-01 – 2013-12-07 (×10): 15 mL via OROMUCOSAL

## 2013-12-01 MED ORDER — PANTOPRAZOLE SODIUM 40 MG PO TBEC
40.0000 mg | DELAYED_RELEASE_TABLET | Freq: Every day | ORAL | Status: DC
  Administered 2013-12-01 – 2013-12-07 (×7): 40 mg via ORAL
  Filled 2013-12-01 (×7): qty 1

## 2013-12-01 MED ORDER — POTASSIUM CHLORIDE CRYS ER 20 MEQ PO TBCR
40.0000 meq | EXTENDED_RELEASE_TABLET | ORAL | Status: DC
  Administered 2013-12-01: 40 meq via ORAL
  Filled 2013-12-01 (×2): qty 2

## 2013-12-01 MED ORDER — OXYCODONE HCL 5 MG PO TABS
5.0000 mg | ORAL_TABLET | ORAL | Status: DC | PRN
  Administered 2013-12-01 – 2013-12-02 (×3): 5 mg via ORAL
  Filled 2013-12-01 (×3): qty 1

## 2013-12-01 MED ORDER — ACETAMINOPHEN 500 MG PO TABS
1000.0000 mg | ORAL_TABLET | Freq: Four times a day (QID) | ORAL | Status: DC | PRN
  Administered 2013-12-02: 1000 mg via ORAL
  Filled 2013-12-01 (×2): qty 2

## 2013-12-01 MED ORDER — CARVEDILOL 3.125 MG PO TABS
3.1250 mg | ORAL_TABLET | Freq: Two times a day (BID) | ORAL | Status: DC
  Administered 2013-12-01: 3.125 mg via ORAL
  Filled 2013-12-01 (×4): qty 1

## 2013-12-01 NOTE — Progress Notes (Signed)
Subjective:  Extubated yesterday. Off dopa since 10 am yesterday. Awake. C/O chest pain on moving and with deep breath. Objective:  Vital Signs in the last 24 hours: Temp:  [96.3 F (35.7 C)-98.4 F (36.9 C)] 98.1 F (36.7 C) (05/02 0800) Pulse Rate:  [79-94] 84 (05/02 0500) Resp:  [15-29] 29 (05/02 0500) BP: (114-168)/(54-94) 114/63 mmHg (05/02 0500) SpO2:  [95 %-100 %] 97 % (05/02 0500) Weight:  [90.7 kg (199 lb 15.3 oz)] 90.7 kg (199 lb 15.3 oz) (05/02 0446)  Intake/Output from previous day: 05/01 0701 - 05/02 0700 In: 608.9 [P.O.:240; I.V.:218.9; IV Piggyback:150] Out: 1255 [Urine:1255]  Physical Exam:   General appearance: alert, cooperative, appears stated age, no distress and responds appropriately Eyes: negative findings: lids and lashes normal Neck: no adenopathy and no carotid bruit Neck: JVP - normal, carotids 2+= without bruits Resp: clear to auscultation bilaterally Cardio: regular rate and rhythm, S1, S2 normal, no murmur, click, rub or gallop, distant heart sounds. GI: soft, non-tender; bowel sounds normal; no masses,  no organomegaly Extremities: extremities normal, atraumatic, no cyanosis or edema and the extremities are cool, however there is no clinical evidence of arterial insufficiency.  Bilateral groin sites without any hematoma.    Lab Results: BMP  Recent Labs  11/29/13 1500 11/30/13 0435 12/01/13 0415  NA 138 140 142  K 3.8 3.9 2.9*  CL 100 102 106  CO2 25 25 25   GLUCOSE 133* 129* 89  BUN 29* 30* 29*  CREATININE 1.82* 1.59* 1.45*  CALCIUM 7.9* 8.0* 8.0*  GFRNONAA 38* 45* 50*  GFRAA 44* 52* 58*    CBC  Recent Labs Lab 12/01/13 0415  WBC 10.6*  RBC 3.65*  HGB 10.1*  HCT 29.9*  PLT 130*  MCV 81.9  MCH 27.7  MCHC 33.8  RDW 14.6    HEMOGLOBIN A1C No results found for this basename: HGBA1C,  MPG    Cardiac Panel (last 3 results)  Recent Labs  11/29/13 0900 11/29/13 1500 11/29/13 2145  TROPONINI >20.00* >20.00* >20.00*     BNP (last 3 results) No results found for this basename: PROBNP,  in the last 8760 hours  TSH  Recent Labs  11/27/13 0400  TSH 0.961    CHOLESTEROL  Recent Labs  11/30/13 0435  CHOL 137    Hepatic Function Panel  Recent Labs  11/28/13 0400 11/29/13 0440 11/30/13 0435  PROT 4.7* 5.0* 5.2*  ALBUMIN 2.0* 2.0* 2.0*  AST 2068* 595* 301*  ALT 1165* 990* 829*  ALKPHOS 123* 112 112  BILITOT 0.4 0.3 0.4  BILIDIR  --  <0.2  --   IBILI  --  NOT CALCULATED  --     Cardiac Studies: EKG 11/28/2013: Sinus tachycardia at the rate of 104 bpm, rightward axis, left posterior fascicular block, right bundle branch block.  Nonspecific T abnormality.  Low voltage complexes. EKG 11/29/2013: Normal sinus rhythm, nonspecific T abnormality.  Assessment/Plan:  1.  Cardiogenic shock secondary to multilevel vessel critical coronary artery disease. 2.  CAD of the native vessels, successful PTCA and stenting of the left main, proximal LAD with implantation of a 4.0 x 18 mm Xience alpine stent. Abdominal aortogram, limited bilateral femoral arteriogram to evaluate for PAD for insertion of Impella Circulatory assist device. 3.  Multiorgan failure, acute renal failure, shock liver due to cardiac shock.  Now stable and improved.Marland Kitchen Has hypokalemia.   Recommendation: Cardiac rehab. Start  Coreg. Low dose statin due to liver injury. Replete K. D/W family. Chest pain  mmm skeletal. Start Ultram. Step-down today  [name], M.D. 12/01/2013, 11:31 AM [facility] Pager: [phone] Office: [phone] If no answer: [phone]

## 2013-12-01 NOTE — Progress Notes (Signed)
PULMONARY / CRITICAL CARE MEDICINE  Name: Gilbert [name] MRN: [id number] DOB: 1950/11/11    ADMISSION DATE:  11/26/2013 CONSULTATION DATE:  11/29/2013  REFERRING MD : EDP  PRIMARY SERVICE: PCCM   CHIEF COMPLAINT: Cardiac arrest.   BRIEF PATIENT DESCRIPTION: 63 yo with distant history of seizure disorder ( last seizure 10 years ago, on dilantin ) found unresponsive in recliner. VT / VF >>> shock >>> PEA arrest.   SIGNIFICANT EVENTS / STUDIES:  4/27 VT/VF/PEA arrest  [place] 05, 2024 CT head >>> nad  05-[place]-2024 CTA chest >>> No embolism, bilateral airspace disease  12/05/2022 TTE >>> EF 25% to 30%. There is anterior, anteroseptal, apical and inferoapical hypokinesis  05-[place]-2024 TTE post cath>>> LV severely depressed with global hypokinesis and anterior/anteroseptal dyskinesis.  12-05-22 EEG>>> Normal asleep EEG  2022-12-05 Cardiogenic shock and emergency cath>>>DES stents to Left main and proximal LAD, Insertion of Impella CP Device  4/29 TTE>>> EF 20% to 25%. LV Diffuse hypokinesis 4/30 Impella out 5/01 Extubated successfully  LINES / TUBES:  ETT 4/27 >>> 5/1  L IJ CVL 2022/12/05 >> 5/02 RF AL 2022-12-05 >> 5/01 R Femoral sheath [place] 05, 2024 >>> 4/29  CULTURES:  [place] 05, 2024 MRSA PCR >>> neg  12/05/2022 Blood >>> NEG 12/05/22 Urine >>> neg  [place] 05, 2024 C diff>>>neg 4/29 Sputum >>> neg  ANTIBIOTICS:  Zosyn 12/05/22 >> 5/02 Vanc 12-05-2022 >>> 4/29  INTERVAL HISTORY:  C/O musculoskeletal chest pain. Denies dyspnea  VITAL SIGNS: Temp:  [96.3 F (35.7 C)-98.4 F (36.9 C)] 98.4 F (36.9 C) (05/02 0400) Pulse Rate:  [60-94] 84 (05/02 0500) Resp:  [14-29] 29 (05/02 0500) BP: (103-168)/(54-94) 114/63 mmHg (05/02 0500) SpO2:  [95 %-100 %] 97 % (05/02 0500) Weight:  [90.7 kg (199 lb 15.3 oz)] 90.7 kg (199 lb 15.3 oz) (05/02 0446)  HEMODYNAMICS:    VENTILATOR SETTINGS: Vent Mode:  [-] Stand-by  INTAKE / OUTPUT: Intake/Output     05/01 0701 - 05/02 0700 05/02 0701 - 05/03 0700   P.O. 240    I.V. (mL/kg) 218.9 (2.4)    Other     NG/GT     IV Piggyback 150    Total Intake(mL/kg) 608.9 (6.7)    Urine (mL/kg/hr) 1255 (0.6)    Stool     Total Output 1255     Net -646.1            PHYSICAL EXAMINATION: General: NAD Neuro: No focal deficits HEENT: WNL  Cardiovascular: RRR, no M Lungs: no wheezes Abdomen: Soft, nontender, +BS  Musculoskeletal: No edema   LABS: I have reviewed all of today's lab results. 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  Cardiogenic shock, resolved P:  Mgmt per Cards  RENAL  A:  AKI, improving Renal carcinoma s/p nephrectomy  P:  Monitor BMET intermittently Monitor I/Os Correct electrolytes as indicated  GASTROINTESTINAL  A:  Shocked liver, improving GI Px Nutrition  P:  Advance diet to CHO modified  HEMATOLOGIC  A:  Anemia Thrombocytopenia, improving P:  Fully anticoagulated Monitor CBC intermittently   INFECTIOUS  A:  No evidence of active bacterial infection presently P:  Abx/cx as above  DC pip-zosyn  ENDOCRINE  A:  Hyperglycemia, resolved Doubt relative adrenal insufficiency  P:  DC stress dose steroids DC SSI  NEUROLOGIC  A:  Acute encephalopathy, resolved H/o seizures Chest pain due to CPR P:  Cont PO phenytoin Add PRN hydrocodone for pain    Transfer to SDU Discussed with Dr Einar Gip who has agreed to assume primary duties PCCM will sign off. Please call if we can be of further assistance  Merton Border, MD ; [facility] [phone].  After 5:30 PM or weekends, call [phone]

## 2013-12-01 NOTE — Progress Notes (Signed)
[facility] ADULT ICU REPLACEMENT PROTOCOL FOR AM LAB REPLACEMENT ONLY  The patient does apply for the [facility] Adult ICU Electrolyte Replacment Protocol based on the criteria listed below:   1. Is GFR >/= 40 ml/min? yes  Patient's GFR today is 50 2. Is urine output >/= 0.5 ml/kg/hr for the last 6 hours? yes Patient's UOP is 1.2 ml/kg/hr 3. Is BUN < 60 mg/dL? yes  Patient's BUN today is 29 4. Abnormal electrolyte(s): K+2.9 5. Ordered repletion with: protocol 6. If a panic level lab has been reported, has the CCM MD in charge been notified? yes.   Physician:  E Deterding  [name] 12/01/2013 5:15 AM

## 2013-12-02 ENCOUNTER — Inpatient Hospital Stay (HOSPITAL_COMMUNITY): Payer: BC Managed Care – PPO

## 2013-12-02 LAB — COMPREHENSIVE METABOLIC PANEL
ALBUMIN: 2.4 g/dL — AB (ref 3.5–5.2)
ALK PHOS: 139 U/L — AB (ref 39–117)
ALT: 356 U/L — ABNORMAL HIGH (ref 0–53)
AST: 80 U/L — ABNORMAL HIGH (ref 0–37)
BUN: 25 mg/dL — ABNORMAL HIGH (ref 6–23)
CO2: 15 mEq/L — ABNORMAL LOW (ref 19–32)
Calcium: 8.7 mg/dL (ref 8.4–10.5)
Chloride: 108 mEq/L (ref 96–112)
Creatinine, Ser: 1.25 mg/dL (ref 0.50–1.35)
GFR calc Af Amer: 69 mL/min — ABNORMAL LOW (ref >90)
GFR calc non Af Amer: 60 mL/min — ABNORMAL LOW (ref >90)
Glucose, Bld: 69 mg/dL — ABNORMAL LOW (ref 70–99)
POTASSIUM: 5.1 meq/L (ref 3.7–5.3)
SODIUM: 142 meq/L (ref 137–147)
Total Bilirubin: 0.5 mg/dL (ref 0.3–1.2)
Total Protein: 6 g/dL (ref 6.0–8.3)

## 2013-12-02 LAB — CBC
HEMATOCRIT: 31.5 % — AB (ref 39.0–52.0)
Hemoglobin: 10.9 g/dL — ABNORMAL LOW (ref 13.0–17.0)
MCH: 28.2 pg (ref 26.0–34.0)
MCHC: 34.6 g/dL (ref 30.0–36.0)
MCV: 81.6 fL (ref 78.0–100.0)
Platelets: 137 10*3/uL — ABNORMAL LOW (ref 150–400)
RBC: 3.86 MIL/uL — ABNORMAL LOW (ref 4.22–5.81)
RDW: 14.7 % (ref 11.5–15.5)
WBC: 9.8 10*3/uL (ref 4.0–10.5)

## 2013-12-02 MED ORDER — LISINOPRIL 5 MG PO TABS
5.0000 mg | ORAL_TABLET | Freq: Every day | ORAL | Status: DC
  Administered 2013-12-02 – 2013-12-07 (×6): 5 mg via ORAL
  Filled 2013-12-02 (×6): qty 1

## 2013-12-02 MED ORDER — DIPHENHYDRAMINE HCL 25 MG PO CAPS
25.0000 mg | ORAL_CAPSULE | Freq: Once | ORAL | Status: AC
  Administered 2013-12-02: 25 mg via ORAL
  Filled 2013-12-02: qty 1

## 2013-12-02 MED ORDER — CARVEDILOL 6.25 MG PO TABS
6.2500 mg | ORAL_TABLET | Freq: Two times a day (BID) | ORAL | Status: DC
  Administered 2013-12-02 – 2013-12-04 (×5): 6.25 mg via ORAL
  Filled 2013-12-02 (×8): qty 1

## 2013-12-02 NOTE — Progress Notes (Signed)
Subjective:   C/O chest pain on moving and with deep breath. No other specific complaints.  Objective:  Vital Signs in the last 24 hours: Temp:  [98.2 F (36.8 C)-98.6 F (37 C)] 98.4 F (36.9 C) (05/03 1700) Pulse Rate:  [86-96] 88 (05/03 1700) Resp:  [20-29] 25 (05/03 1700) BP: (131-150)/(70-94) 139/81 mmHg (05/03 1700) SpO2:  [92 %-99 %] 92 % (05/03 1700)  Intake/Output from previous day: 05/02 0701 - 05/03 0700 In: 970 [P.O.:770; I.V.:200] Out: 1575 [Urine:1500; Stool:75]  Physical Exam:   General appearance: alert, cooperative, appears stated age, no distress and responds appropriately Eyes: negative findings: lids and lashes normal Neck: no adenopathy and no carotid bruit Neck: JVP - normal, carotids 2+= without bruits Resp: clear to auscultation bilaterally Cardio: regular rate and rhythm, S1, S2 normal, no murmur, click, rub or gallop, distant heart sounds. GI: soft, non-tender; bowel sounds normal; no masses,  no organomegaly Extremities: extremities normal, atraumatic, no cyanosis or edema and the foot is warm bilateral.    Lab Results: BMP  Recent Labs  11/30/13 0435 12/01/13 0415 12/01/13 1230 12/02/13 0710  NA 140 142  --  142  K 3.9 2.9* 3.3* 5.1  CL 102 106  --  108  CO2 25 25  --  15*  GLUCOSE 129* 89  --  69*  BUN 30* 29*  --  25*  CREATININE 1.59* 1.45*  --  1.25  CALCIUM 8.0* 8.0*  --  8.7  GFRNONAA 45* 50*  --  60*  GFRAA 52* 58*  --  69*    CBC  Recent Labs Lab 12/02/13 1115  WBC 9.8  RBC 3.86*  HGB 10.9*  HCT 31.5*  PLT 137*  MCV 81.6  MCH 28.2  MCHC 34.6  RDW 14.7    HEMOGLOBIN A1C No results found for this basename: HGBA1C,  MPG    Cardiac Panel (last 3 results)  Recent Labs  11/29/13 0900 11/29/13 1500 11/29/13 2145  TROPONINI >20.00* >20.00* >20.00*    BNP (last 3 results) No results found for this basename: PROBNP,  in the last 8760 hours  TSH  Recent Labs  11/27/13 0400  TSH 0.961     CHOLESTEROL  Recent Labs  11/30/13 0435  CHOL 137    Hepatic Function Panel  Recent Labs  11/28/13 0400 11/29/13 0440 11/30/13 0435 12/02/13 0710  PROT 4.7* 5.0* 5.2* 6.0  ALBUMIN 2.0* 2.0* 2.0* 2.4*  AST 2068* 595* 301* 80*  ALT 1165* 990* 829* 356*  ALKPHOS 123* 112 112 139*  BILITOT 0.4 0.3 0.4 0.5  BILIDIR  --  <0.2  --   --   IBILI  --  NOT CALCULATED  --   --     Cardiac Studies: EKG 11/28/2013: Sinus tachycardia at the rate of 104 bpm, rightward axis, left posterior fascicular block, right bundle branch block.  Nonspecific T abnormality.  Low voltage complexes. EKG 11/29/2013: Normal sinus rhythm, nonspecific T abnormality. Tele: NSR. No VT  Assessment/Plan:  1.  Cardiogenic shock secondary to multilevel vessel critical coronary artery disease. 2.  CAD of the native vessels, successful PTCA and stenting of the left main, proximal LAD with implantation of a 4.0 x 18 mm Xience alpine stent.  3.  Multiorgan failure, acute renal failure, shock liver due to cardiac shock.  Now stable and improved. S. Cr back to normal. Potassium repleted.   Recommendation: Cardiac rehab. increase Coreg 6.25 mg bid and add lisinopril 5 mg q daily. CM to  see regarding IP rehab, discharge planning. [name], M.D. 12/02/2013, 6:10 PM [facility] [place] Pager: [phone] Office: [phone] If no answer: [phone]

## 2013-12-02 NOTE — Evaluation (Signed)
Physical Therapy Evaluation Patient Details Name: Gilbert [name] MRN: [id number] DOB: 02-16-1951 Today's Date: 12/02/2013   History of Present Illness  63 year old male with PMH of seizures, HTN, and recent nephrectomy. Found unresponsive in recliner 4/27 with unknown down time, could have been as much as 2 hours. EMS was called. VF/VT at EMS arrival (per NP reports on this were very conflicting if happened or not) which then became PEA arrest. Intubated, ACLS was performed. Reportedly 2 rounds of CPR and epi achieved ROSC. In ED found to have mildly elevated troponin;  4/28 Cardiogenic shock and emergency cath>>>DES stents to Left main and proximal LAD, Insertion of Impella CP Device; Impella out 4/30; post procedure and  through stay requiring less inotropic support; 5/01 Extubated successfully; PT and Cardiac Rehab ordered 5/2  Clinical Impression   Pt admitted with above. Pt currently with functional limitations due to the deficits listed below (see PT Problem List). Very painful with activity, and reported dizziness EOB;  Pt will benefit from skilled PT to increase their independence and safety with mobility to allow discharge to the venue listed below.   Not exactly sure what pt's functional level was post recent nephrectomy      Follow Up Recommendations CIR    Equipment Recommendations  Rolling walker with 5" wheels;3in1 (PT)    Recommendations for Other Services OT consult;Rehab consult     Precautions / Restrictions Precautions Precautions: Fall Precaution Comments: significant Chest pain with activity, likely due to chest compressions during CPR      Mobility  Bed Mobility Overal bed mobility: Needs Assistance;+ 2 for safety/equipment Bed Mobility: Supine to Sit     Supine to sit: Mod assist;+2 for safety/equipment     General bed mobility comments: Cues for initiation and technique; Assisted LEs to EOB at pt's request; Physical assist to acheive upright sitting --  support given posteriorly with assist at shoulders, and assist in front given at pelvic girdle to facilitate getting to upright sitting with equal weight bilaterally; Assist foot placement for upright sitting  Transfers                 General transfer comment: Intiated session with goal/intention of assessing sit<>stand, however pt with decr sitting balance and decr activity tolerance, and we therefore chose to lay back down and position pt comfortably in bed  Ambulation/Gait                Stairs            Wheelchair Mobility    Modified Rankin (Stroke Patients Only)       Balance Overall balance assessment: Needs assistance Sitting-balance support: Bilateral upper extremity supported Sitting balance-Leahy Scale: Poor Sitting balance - Comments: Initially not needing support with sitting EOB, however noted L eft lean which pt did not self-correct, and he needed phsyical assist to regain upright sitting; Attempted to acquire sitting BP as pt reported dizziness sitting EOB, until pt finally stated, "Woman, I'm dying" -- at which point we assisted pt back to supine in bed Postural control: Left lateral lean                                   Pertinent Vitals/Pain 7/10 Chest pain with sitting up patient repositioned for comfort, and RN notified     Home Living Family/patient expects to be discharged to:: Private residence Living Arrangements: Children Available Help at Discharge:  Family;Available 24 hours/day (Available assist from daughters will need to be clarified) Type of Home: House Home Access: Stairs to enter Entrance Stairs-Rails: None Entrance Stairs-Number of Steps: 3 Home Layout: One level Home Equipment: Other (comment) (to be determined)      Prior Function Level of Independence: Independent               Hand Dominance        Extremity/Trunk Assessment   Upper Extremity Assessment: Generalized weakness;Defer to OT  evaluation           Lower Extremity Assessment: Generalized weakness         Communication   Communication: No difficulties  Cognition Arousal/Alertness: Lethargic;Suspect due to medications (had taken Benadryl per RN; not restful sleep last night) Behavior During Therapy: Flat affect;Restless Overall Cognitive Status: Within Functional Limits for tasks assessed                      General Comments      Exercises        Assessment/Plan    PT Assessment Patient needs continued PT services  PT Diagnosis Generalized weakness;Acute pain   PT Problem List Decreased strength;Decreased activity tolerance;Decreased balance;Decreased mobility;Decreased knowledge of use of DME;Decreased knowledge of precautions;Pain;Cardiopulmonary status limiting activity  PT Treatment Interventions DME instruction;Gait training;Stair training;Functional mobility training;Therapeutic exercise;Therapeutic activities;Balance training;Patient/family education   PT Goals (Current goals can be found in the Care Plan section) Acute Rehab PT Goals Patient Stated Goal: Did not state PT Goal Formulation: With patient Time For Goal Achievement: 12/16/13 Potential to Achieve Goals: Good    Frequency Min 3X/week   Barriers to discharge        Co-evaluation               End of Session Equipment Utilized During Treatment: Oxygen (pt on 1/2 Liter once back in bed; RN notified) Activity Tolerance: Patient limited by fatigue;Patient limited by lethargy;Patient limited by pain;Treatment limited secondary to agitation Patient left: in bed;with call bell/phone within reach;with family/visitor present Nurse Communication: Mobility status         Time: 0820-0855 PT Time Calculation (min): 35 min   Charges:   PT Evaluation $Initial PT Evaluation Tier I: 1 Procedure PT Treatments $Therapeutic Activity: 8-22 mins   PT G Codes:          [facility] 12/02/2013, 9:53  AM  Roney Marion, PT  Acute Rehabilitation Services Pager [phone] Office [phone]

## 2013-12-02 NOTE — Progress Notes (Signed)
Rehab Admissions Coordinator Note:  Patient was screened by Cleatrice Burke for appropriateness for an Inpatient Acute Rehab Consult per PT recommendation.  At this time, we are recommending Inpatient Rehab consult and OT eval.  Audelia Acton [facility] 12/02/2013, 4:23 PM  I can be reached at [phone].

## 2013-12-02 NOTE — Progress Notes (Signed)
Family concerned that patient was "talking funny".  Patient bottom lip noted to be swollen.  Denies SOB or tongue swelling.  No rash or erythema noted.  Lung sounds remain clear.  Dr. Einar Gip notified; new orders given.  Will implement and continue to monitor.

## 2013-12-03 DIAGNOSIS — G931 Anoxic brain damage, not elsewhere classified: Secondary | ICD-10-CM

## 2013-12-03 LAB — BASIC METABOLIC PANEL
BUN: 23 mg/dL (ref 6–23)
CHLORIDE: 108 meq/L (ref 96–112)
CO2: 20 mEq/L (ref 19–32)
CREATININE: 1.11 mg/dL (ref 0.50–1.35)
Calcium: 8.2 mg/dL — ABNORMAL LOW (ref 8.4–10.5)
GFR calc Af Amer: 80 mL/min — ABNORMAL LOW (ref >90)
GFR, EST NON AFRICAN AMERICAN: 69 mL/min — AB (ref >90)
Glucose, Bld: 86 mg/dL (ref 70–99)
Potassium: 3.8 mEq/L (ref 3.7–5.3)
Sodium: 142 mEq/L (ref 137–147)

## 2013-12-03 LAB — CULTURE, BLOOD (ROUTINE X 2)
CULTURE: NO GROWTH
Culture: NO GROWTH

## 2013-12-03 MED ORDER — ENSURE COMPLETE PO LIQD
237.0000 mL | Freq: Two times a day (BID) | ORAL | Status: DC
  Administered 2013-12-04 – 2013-12-07 (×6): 237 mL via ORAL

## 2013-12-03 NOTE — Progress Notes (Signed)
CARDIAC REHAB PHASE I   PRE:  Rate/Rhythm: 91 SR  BP:  Sitting: 100/70      SaO2: 96 RA  MODE:  Ambulation: to bathroom    POST:  Rate/Rhythm: 98 SR  BP:  Sitting: 110/76     SaO2: 96 RA 1455-1525 Attempted to ambulate pt with RW x 2 assist. Once standing, pt began to stool. Assisted to BR. Pt extremely weak and fatigued. Just ambulated with PT an hour ago. Verbalized multiple times that he wanted just to go to bed. Pt encouraged to walk but since just walked an hour ago and with noted fatigue, pt assisted back to bed with call light in place. Pt did c/o dizziness and black spots in eyes. Vitals remained stable as noted above.  Denissa Cozart English PayneRN, BSN 12/03/2013 3:25 PM

## 2013-12-03 NOTE — Progress Notes (Signed)
Subjective:   C/O chest pain on moving and with deep breath. No other specific complaints. Patient was able to sit on the side of the bed for 2 hours yesterday. Being evaluated by rehabilitation. Objective:  Vital Signs in the last 24 hours: Temp:  [97.9 F (36.6 C)-98.6 F (37 C)] 97.9 F (36.6 C) (05/04 0750) Pulse Rate:  [88-96] 92 (05/04 0750) Resp:  [25-32] 26 (05/04 0750) BP: (139-162)/(71-83) 152/83 mmHg (05/04 0750) SpO2:  [92 %-98 %] 94 % (05/04 0750)  Intake/Output from previous day: 05/03 0701 - 05/04 0700 In: 360 [P.O.:360] Out: 1100 [Urine:1100]  Physical Exam:   General appearance: alert, cooperative, appears stated age, no distress and responds appropriately Eyes: negative findings: lids and lashes normal Neck: no adenopathy and no carotid bruit Neck: JVP - normal, carotids 2+= without bruits Resp: clear to auscultation bilaterally chest wall is very tender. Cardio: regular rate and rhythm, S1, S2 normal, no murmur, click, rub or gallop, distant heart sounds. GI: soft, non-tender; bowel sounds normal; no masses,  no organomegaly Extremities: extremities normal, atraumatic, no cyanosis or edema and the foot is warm bilateral.    Lab Results: BMP  Recent Labs  12/01/13 0415 12/01/13 1230 12/02/13 0710 12/03/13 0452  NA 142  --  142 142  K 2.9* 3.3* 5.1 3.8  CL 106  --  108 108  CO2 25  --  15* 20  GLUCOSE 89  --  69* 86  BUN 29*  --  25* 23  CREATININE 1.45*  --  1.25 1.11  CALCIUM 8.0*  --  8.7 8.2*  GFRNONAA 50*  --  60* 69*  GFRAA 58*  --  69* 80*    CBC  Recent Labs Lab 12/02/13 1115  WBC 9.8  RBC 3.86*  HGB 10.9*  HCT 31.5*  PLT 137*  MCV 81.6  MCH 28.2  MCHC 34.6  RDW 14.7    HEMOGLOBIN A1C No results found for this basename: HGBA1C,  MPG    Cardiac Panel (last 3 results)  Recent Labs  11/29/13 0900 11/29/13 1500 11/29/13 2145  TROPONINI >20.00* >20.00* >20.00*    BNP (last 3 results) No results found for this  basename: PROBNP,  in the last 8760 hours  TSH  Recent Labs  11/27/13 0400  TSH 0.961    CHOLESTEROL  Recent Labs  11/30/13 0435  CHOL 137   Lipid Panel     Component Value Date/Time   CHOL 137 11/30/2013 0435   TRIG 167* 11/30/2013 0435   HDL 30* 11/30/2013 0435   CHOLHDL 4.6 11/30/2013 0435   VLDL 33 11/30/2013 0435   LDLCALC 74 11/30/2013 0435     Hepatic Function Panel  Recent Labs  11/28/13 0400 11/29/13 0440 11/30/13 0435 12/02/13 0710  PROT 4.7* 5.0* 5.2* 6.0  ALBUMIN 2.0* 2.0* 2.0* 2.4*  AST 2068* 595* 301* 80*  ALT 1165* 990* 829* 356*  ALKPHOS 123* 112 112 139*  BILITOT 0.4 0.3 0.4 0.5  BILIDIR  --  <0.2  --   --   IBILI  --  NOT CALCULATED  --   --     Cardiac Studies: EKG 11/28/2013: Sinus tachycardia at the rate of 104 bpm, rightward axis, left posterior fascicular block, right bundle branch block.  Nonspecific T abnormality.  Low voltage complexes. EKG 11/29/2013: Normal sinus rhythm, nonspecific T abnormality. Tele: NSR. No VT  Assessment/Plan:  1.  Cardiogenic shock secondary to multilevel vessel critical coronary artery disease. 2.  CAD of  the native vessels, successful PTCA and stenting of the left main, proximal LAD with implantation of a 4.0 x 18 mm Xience alpine stent.  3.  Multiorgan failure, acute renal failure, shock liver due to cardiac shock.  Resolved. 4. H/O Hypertension. BP trending up, patient is tolerating Coreg and also lisinopril. Patient now on low-dose of atorvastatin.  Recommendation: Cardiac rehab. increase Coreg 6.25 mg Tid  CM to see regarding IP rehab, discharge planning. I will transfer the patient to telemetry, chest x-ray reviewed, no obvious rib fractures. Mild pulmonary congestion is evident. Patient [facility] need inpatient cardiac rehabilitation/inpatient rehabilitation, patient lives with his daughter and will be alone and the daughter goes to work. His main limitation is just wall pain at this point. [name],  M.D. 12/03/2013, 8:22 AM [facility] Pager: [phone] Office: [phone] If no answer: [phone]

## 2013-12-03 NOTE — Consult Note (Signed)
Physical Medicine and Rehabilitation Consult  Reason for Consult: Cardiac arrest  Referring Physician:  Dr. Einar Gip   HPI: Gilbert [name] is a 63 y.o. male with history of seizures, HTN, recent nephrectomy for CA, who was found unresponsive in his chair on 11/27/13--downtime unknown. When the EMS came after activation from the family members, patient was found to be in V. fib/VF, was defibrillated, eventually on presentation to the emergency room, intubated and resuscitated. hypothermia protocol initiated and 2D echo with EF 25-30% with suggestion of sever LAD territory ischemia or possibly Takatsubo CM. Cardiogenic shock treated with pressors and aspiration PNA with pneumonitis treated with IV antibiotics.  He underwent cardiac cath with PTCA and stenting of left main and proximal LAD and placement of Impella CP device (to help with cardiac output)  by Dr. Einar Gip. Patient with LLE coolness post Impella removal and ABI with mild to moderate plaque in L-FA with moderate to severe reduction in arterial flow. Extubated on 11/30/13 and therapies initiated yesterday. CIR recommended by rehab team due to deconditioned state.   Pt did not recognize pastor for almost a minute after he visited room Pt aware of hospital , [facility], but not aware of cardiac arrest   Review of Systems  HENT: Negative for hearing loss.   Eyes: Negative for blurred vision and double vision.  Respiratory: Positive for cough.   Cardiovascular: Positive for chest pain (chest wall pain).  Gastrointestinal: Negative for heartburn and abdominal pain.  Musculoskeletal: Positive for myalgias.  Neurological: Negative for headaches.  Psychiatric/Behavioral: Positive for memory loss.    Past Medical History  Diagnosis Date  . HTN (hypertension)   . Seizures   . Cardiac arrest   . Cancer     History reviewed. No pertinent past surgical history.   No family history on file.  Social History:  Lives with daughter. Daughter  works days. Retired Games developer.  Independent without AD. He reports he does not smoke. His smokeless tobacco use includes Chew. His alcohol and drug histories are not on file.   Allergies  Allergen Reactions  . Oxycodone Swelling    Tongue and lip   Medications Prior to Admission  Medication Sig Dispense Refill  . aspirin EC 81 MG tablet Take 81 mg by mouth daily.      Marland Kitchen docusate sodium (COLACE) 100 MG capsule Take 100 mg by mouth 2 (two) times daily.      . lansoprazole (PREVACID) 30 MG capsule Take 30 mg by mouth daily.      Marland Kitchen lisinopril (PRINIVIL,ZESTRIL) 20 MG tablet Take 20 mg by mouth at bedtime.      . metoprolol tartrate (LOPRESSOR) 25 MG tablet Take 25 mg by mouth 2 (two) times daily.      . phenytoin (DILANTIN) 100 MG ER capsule Take 300 mg by mouth daily.      . pravastatin (PRAVACHOL) 40 MG tablet Take 40 mg by mouth daily.      . probenecid (BENEMID) 500 MG tablet Take 500 mg by mouth daily.        Home: Home Living Family/patient expects to be discharged to:: Private residence Living Arrangements: Children Available Help at Discharge: Family;Available 24 hours/day (Available assist from daughters will need to be clarified) Type of Home: House Home Access: Stairs to enter [employer] of Steps: 3 Entrance Stairs-Rails: None Home Layout: One level Home Equipment: Other (comment) (to be determined)  Functional History: Prior Function Level of Independence: Independent Functional Status:  Mobility:  Bed Mobility Overal bed mobility: Needs Assistance;+ 2 for safety/equipment Bed Mobility: Supine to Sit Supine to sit: Mod assist;+2 for safety/equipment General bed mobility comments: Cues for initiation and technique; Assisted LEs to EOB at pt's request; Physical assist to acheive upright sitting -- support given posteriorly with assist at shoulders, and assist in front given at pelvic girdle to facilitate getting to upright sitting with equal weight  bilaterally; Assist foot placement for upright sitting Transfers General transfer comment: Intiated session with goal/intention of assessing sit<>stand, however pt with decr sitting balance and decr activity tolerance, and we therefore chose to lay back down and position pt comfortably in bed      ADL:    Cognition: Cognition Overall Cognitive Status: Within Functional Limits for tasks assessed Orientation Level: Oriented X4 Cognition Arousal/Alertness: Lethargic;Suspect due to medications (had taken Benadryl per RN; not restful sleep last night) Behavior During Therapy: Flat affect;Restless Overall Cognitive Status: Within Functional Limits for tasks assessed  Blood pressure 152/83, pulse 92, temperature 97.9 F (36.6 C), temperature source Oral, resp. rate 26, height 5\' 8"  (1.727 m), weight 90.7 kg (199 lb 15.3 oz), SpO2 94.00%. Physical Exam  Nursing note and vitals reviewed. Constitutional: He is oriented to person, place, and time. He appears well-developed and well-nourished.  HENT:  Head: Normocephalic and atraumatic.  Eyes: Conjunctivae are normal.  Neck: Normal range of motion. Neck supple.  Cardiovascular: Normal rate and regular rhythm.   No murmur heard. Respiratory: He has rhonchi in the right middle field and the right lower field. He exhibits tenderness (with minimal activity in bed. ).  Weak cough.   GI: Soft. Bowel sounds are normal. He exhibits no distension.  Musculoskeletal: He exhibits no edema.  BLE warm to touch. No cyanosis. No pain with ROM.   Neurological: He is alert and oriented to person, place, and time.  Speech clear. Some problems with recall of recent incidents. Follows basic commands without difficulty. Mildly impulsive with impaired insight.   Skin: Skin is warm and dry.   motor strength is 4/5 bilateral deltoid, bicep, tricep, grip, hip flexors, knee extensors, ankle dorsiflexion plantar flexor Sensation intact to light touch in bilateral  upper and lower limbs. Pain with cough  Results for orders placed during the hospital encounter of 11/26/13 (from the past 24 hour(s))  CBC     Status: Abnormal   Collection Time    12/02/13 11:15 AM      Result Value Ref Range   WBC 9.8  4.0 - 10.5 K/uL   RBC 3.86 (*) 4.22 - 5.81 MIL/uL   Hemoglobin 10.9 (*) 13.0 - 17.0 g/dL   HCT 31.5 (*) 39.0 - 52.0 %   MCV 81.6  78.0 - 100.0 fL   MCH 28.2  26.0 - 34.0 pg   MCHC 34.6  30.0 - 36.0 g/dL   RDW 14.7  11.5 - 15.5 %   Platelets 137 (*) 150 - 400 K/uL  BASIC METABOLIC PANEL     Status: Abnormal   Collection Time    12/03/13  4:52 AM      Result Value Ref Range   Sodium 142  137 - 147 mEq/L   Potassium 3.8  3.7 - 5.3 mEq/L   Chloride 108  96 - 112 mEq/L   CO2 20  19 - 32 mEq/L   Glucose, Bld 86  70 - 99 mg/dL   BUN 23  6 - 23 mg/dL   Creatinine, Ser 1.11  0.50 - 1.35 mg/dL  Calcium 8.2 (*) 8.4 - 10.5 mg/dL   GFR calc non Af Amer 69 (*) >90 mL/min   GFR calc Af Amer 80 (*) >90 mL/min   Dg Chest Port 1 View  12/02/2013   CLINICAL DATA:  Respiratory failure.  EXAM: PORTABLE CHEST - 1 VIEW  COMPARISON:  DG CHEST 1V PORT dated 11/30/2013  FINDINGS: Interim removal of endotracheal tube, left IJ line, and NG tube. Stable cardiomegaly with mild pulmonary vascular prominence and diffuse bilateral interstitial prominence consistent with congestive heart failure pulmonary interstitial edema. Small pleural effusions are present.  IMPRESSION: 1. Interim removal of lines and tubes. 2. Congestive heart failure with mild pulmonary interstitial edema and small pleural effusions.   Electronically Signed   By: Marcello Moores  Register   On: 12/02/2013 07:23    Assessment/Plan: Diagnosis: Deconditioning following cardiac arrest with hypoxic encephalopathy. 1. Does the need for close, 24 hr/day medical supervision in concert with the patient's rehab needs make it unreasonable for this patient to be served in a less intensive setting? Potentially 2. Co-Morbidities  requiring supervision/potential complications: Cardiomyopathy, renal cell carcinoma status post nephrectomy approximately one month ago, recent aspiration pneumonia, chest contusion secondary to CPR 3. Due to bladder management, bowel management, safety, skin/wound care, disease management, medication administration, pain management and patient education, does the patient require 24 hr/day rehab nursing? Potentially 4. Does the patient require coordinated care of a physician, rehab nurse, PT (1-2 hrs/day, 5 days/week), OT (1-2 hrs/day, 5 days/week) and SLP (0.5-1 hrs/day, 5 days/week) to address physical and functional deficits in the context of the above medical diagnosis(es)? Yes and Potentially Addressing deficits in the following areas: balance, endurance, locomotion, strength, transferring, bowel/bladder control, bathing, dressing, feeding, grooming, toileting and cognition 5. Can the patient actively participate in an intensive therapy program of at least 3 hrs of therapy per day at least 5 days per week? Potentially 6. The potential for patient to make measurable gains while on inpatient rehab is fair 7. Anticipated functional outcomes upon discharge from inpatient rehab are n/a  with PT, n/a with OT, n/a with SLP. 8. Estimated rehab length of stay to reach the above functional goals is: NA 9. Does the patient have adequate social supports to accommodate these discharge functional goals? Potentially 10. Anticipated D/C setting: Home 11. Anticipated post D/C treatments: [place] therapy 12. Overall Rehab/Functional Prognosis: good  RECOMMENDATIONS: This patient's condition is appropriate for continued rehabilitative care in the following setting: Not able to tolerate CIR at the current time, once therapy is tolerated on acute unit then recommend CIR Patient has agreed to participate in recommended program. Potentially Note that insurance prior authorization [place] be required for reimbursement for  recommended care.  Comment: Rehabilitation admission coordinator to follow    12/03/2013

## 2013-12-03 NOTE — Progress Notes (Signed)
NUTRITION FOLLOW UP  Intervention:   1.  Supplements; Ensure Complete po BID, each supplement provides 350 kcal and 13 grams of protein. 2.  General healthful diet; encourage intake of foods and beverages as able.  RD to follow and assess for nutritional adequacy.   Nutrition Dx:   Inadequate oral intake related to inability to eat as evidenced by NPO, intubated.   Monitor:  1. Enteral nutrition; initiation with tolerance. Pt to meet >/=90% estimated needs with nutrition support. Not met, no longer appropriate. 2.  Food/Beverage; pt meeting >/=90% estimated needs with tolerance. 3. Wt/wt change; monitor trends.  Ongoing.   Assessment:   Pt admitted with cardiac arrest.  Pt with h/o seizure disorder.   Pt has been extubated.  Diet advanced to Heart Healthy/CHO Modified.  PO intake 20-25% of meals. Discussed with RN. Pt reportedly not eating due to dislike of food- wants steak.  Daughter is helping to encourage intake, however pt difficult to motivate at this time.  Being considered for CIR.  RD will continue to follow for ongoing interventions as needed.   Height: Ht Readings from Last 1 Encounters:  11/27/13 _0  (1.727 m)    Weight Status:   Wt Readings from Last 1 Encounters:  12/01/13 199 lb 15.3 oz (90.7 kg)    Re-estimated needs:  Kcal: 2080-2200 Protein: 95-115g Fluid: ~2.0 L/day  Skin: generalized edema  Diet Order:  Heart Healthy/CHO Modified   Intake/Output Summary (Last 24 hours) at 12/03/13 1143 Last data filed at 12/03/13 1000  Gross per 24 hour  Intake    400 ml  Output    750 ml  Net   -350 ml    Last BM: 4/30   Labs:   Recent Labs Lab 11/30/13 0435 11/30/13 1600 12/01/13 0415 12/01/13 1230 12/02/13 0710 12/03/13 0452  NA 140  --  142  --  142 142  K 3.9  --  2.9* 3.3* 5.1 3.8  CL 102  --  106  --  108 108  CO2 25  --  25  --  15* 20  BUN 30*  --  29*  --  25* 23  CREATININE 1.59*  --  1.45*  --  1.25 1.11  CALCIUM 8.0*  --  8.0*   --  8.7 8.2*  MG 1.9  --  2.0 1.8  --   --   PHOS 4.2 6.5* 2.7  --   --   --   GLUCOSE 129*  --  89  --  69* 86    CBG (last 3)   Recent Labs  12/01/13 0006 12/01/13 0414 12/01/13 0801  GLUCAP 105* 87 76    Scheduled Meds: . antiseptic oral rinse  15 mL Mouth Rinse BID  . aspirin  81 mg Oral Daily  . atorvastatin  10 mg Oral q1800  . carvedilol  6.25 mg Oral BID WC  . lisinopril  5 mg Oral Daily  . pantoprazole  40 mg Oral Q1200  . phenytoin  300 mg Oral QHS  . ticagrelor  90 mg Oral BID    Continuous Infusions: . sodium chloride Stopped (12/01/13 1600)    Brynda Greathouse, MS RD LDN Clinical Inpatient Dietitian Pager: [phone] Weekend/After hours pager: [phone]

## 2013-12-03 NOTE — Progress Notes (Signed)
Pharmacy Consult Note  Pharmacy Consult for dilantin Indication: Sz disorder  Allergies  Allergen Reactions  . Oxycodone Swelling    Tongue and lip    Patient Measurements: Height: 5\' 8"  (172.7 cm) Weight: 199 lb 15.3 oz (90.7 kg) IBW/kg (Calculated) : 68.4  Vital Signs: Temp: 97.9 F (36.6 C) (05/04 0750) Temp src: Oral (05/04 0750) BP: 148/76 mmHg (05/04 1001) Pulse Rate: 92 (05/04 0750) Intake/Output from previous day: 05/03 0701 - 05/04 0700 In: 360 [P.O.:360] Out: 1100 [Urine:1100] Intake/Output from this shift: Total I/O In: 60 [P.O.:60] Out: -   Labs:  Recent Labs  12/01/13 0415 12/02/13 0710 12/02/13 1115 12/03/13 0452  WBC 10.6*  --  9.8  --   HGB 10.1*  --  10.9*  --   PLT 130*  --  137*  --   CREATININE 1.45* 1.25  --  1.11   Estimated Creatinine Clearance: 74.5 ml/min (by C-G formula based on Cr of 1.11). No results found for this basename: VANCOTROUGH, VANCOPEAK, VANCORANDOM, GENTTROUGH, GENTPEAK, GENTRANDOM, TOBRATROUGH, TOBRAPEAK, TOBRARND, AMIKACINPEAK, AMIKACINTROU, AMIKACIN,  in the last 72 hours    Assessment:  Seizure d/o (no sz noted in over 10y): dilantin level subtherapeutic on admission, patient was reloaded and home dose continued by IV route. Level checked 5/1 was within goal range considering renal function and malnutrition with albumin of 2. Level resulted at 8.3 corrected to 16. No new seizures noted. Patient extubated and doing well, expect nutrition to continue to improve. Do not feel overly compelled to recheck dilantin level as he is now back on his home dose and doing very well. Pharmacy to sign off.   Goal of Therapy:  Phenytoin level 10-20  Plan:   Continue dilantin 300mg  po daily  Pharmacy to sign off and follow peripherally  Georgina Peer 12/03/2013,10:14 AM

## 2013-12-03 NOTE — Progress Notes (Signed)
9935 Read PT note from yesterday. Was not able to walk. PT to see today. We will follow progress with PT and begin seeing pt when appropriate. Graylon Good RN BSN 12/03/2013 8:14 AM

## 2013-12-03 NOTE — Progress Notes (Signed)
Physical Therapy Treatment Patient Details Name: Gilbert [name] MRN: [id number] DOB: 09-08-1950 Today's Date: 12/03/2013    History of Present Illness 63 year old male with PMH of seizures, HTN, and recent nephrectomy. Found unresponsive in recliner 4/27 with unknown down time, could have been as much as 2 hours. EMS was called. VF/VT at EMS arrival (per NP reports on this were very conflicting if happened or not) which then became PEA arrest. Intubated, ACLS was performed. Reportedly 2 rounds of CPR and epi achieved ROSC. In ED found to have mildly elevated troponin;  4/28 Cardiogenic shock and emergency cath>>>DES stents to Left main and proximal LAD, Insertion of Impella CP Device; Impella out 4/30; post procedure and  through stay requiring less inotropic support; 5/01 Extubated successfully; PT and Cardiac Rehab ordered 5/2    PT Comments    Pt progressing towards physical therapy goals. Demonstrated improved sitting balance and overall tolerance for functional activity, however did have several episodes of knee buckling during ambulation requiring assist to recover. Appeared somewhat self-limiting at first but once began moving, pt was very motivated to participate. Able to tolerate therapeutic exercise well. Will continue to progress per POC.   Follow Up Recommendations  CIR     Equipment Recommendations  Rolling walker with 5" wheels;3in1 (PT)    Recommendations for Other Services Rehab consult;OT consult     Precautions / Restrictions Precautions Precautions: Fall Precaution Comments: significant Chest pain with activity, likely due to chest compressions during CPR Restrictions Weight Bearing Restrictions: No    Mobility  Bed Mobility Overal bed mobility: Needs Assistance;+ 2 for safety/equipment Bed Mobility: Supine to Sit     Supine to sit: Min assist;+2 for physical assistance     General bed mobility comments: VC's for sequencing and technique. Hand-over-hand to grab  bed rails, and assist to initiate rolling with further assist to keep pt from rolling too close to EOB. +2 to achieve full upright sitting position.   Transfers Overall transfer level: Needs assistance Equipment used: Rolling walker (2 wheeled) Transfers: Sit to/from Stand Sit to Stand: Min guard         General transfer comment: VC's for hand placement on seated surface for safety. Pt able to power-up to full stand with assist for balance only.   Ambulation/Gait Ambulation/Gait assistance: Min assist Ambulation Distance (Feet): 70 Feet Assistive device: Rolling walker (2 wheeled) Gait Pattern/deviations: Step-through pattern;Decreased stride length;Trunk flexed Gait velocity: Decreased Gait velocity interpretation: Below normal speed for age/gender General Gait Details: VC's for sequencing and safety awareness with the RW. Pt with several episodes of knees buckling, with min assist required to recover. Pt reports this has not happened to him before.    Stairs            Wheelchair Mobility    Modified Rankin (Stroke Patients Only)       Balance Overall balance assessment: Needs assistance Sitting-balance support: Feet supported;Bilateral upper extremity supported Sitting balance-Leahy Scale: Fair     Standing balance support: Bilateral upper extremity supported Standing balance-Leahy Scale: Poor                      Cognition Arousal/Alertness: Awake/alert Behavior During Therapy: WFL for tasks assessed/performed Overall Cognitive Status: Within Functional Limits for tasks assessed                      Exercises Total Joint Exercises Quad Sets: 10 reps Towel Squeeze: 10 reps Hip ABduction/ADduction: 10  reps Long Arc Quad: 10 reps    General Comments        Pertinent Vitals/Pain No complaints of SOB, however states ambulation was "hard", indicating his legs and chest.     Home Living                      Prior Function             PT Goals (current goals can now be found in the care plan section) Acute Rehab PT Goals Patient Stated Goal: Get his legs stronger PT Goal Formulation: With patient Time For Goal Achievement: 12/16/13 Potential to Achieve Goals: Good Progress towards PT goals: Progressing toward goals    Frequency  Min 3X/week    PT Plan Current plan remains appropriate    Co-evaluation             End of Session Equipment Utilized During Treatment: Gait belt Activity Tolerance: Patient tolerated treatment well Patient left: in chair;with call bell/phone within reach     Time: 1349-1411 PT Time Calculation (min): 22 min  Charges:  $Gait Training: 8-22 mins                    G Codes:      Jolyn Lent 2013/12/27, 3:05 PM  Jolyn Lent, PT, DPT Acute Rehabilitation Services Pager: [phone]

## 2013-12-04 LAB — BASIC METABOLIC PANEL
BUN: 23 mg/dL (ref 6–23)
CO2: 18 meq/L — AB (ref 19–32)
CREATININE: 1.05 mg/dL (ref 0.50–1.35)
Calcium: 8.5 mg/dL (ref 8.4–10.5)
Chloride: 103 mEq/L (ref 96–112)
GFR calc Af Amer: 85 mL/min — ABNORMAL LOW (ref >90)
GFR calc non Af Amer: 74 mL/min — ABNORMAL LOW (ref >90)
Glucose, Bld: 95 mg/dL (ref 70–99)
Potassium: 4.1 mEq/L (ref 3.7–5.3)
Sodium: 139 mEq/L (ref 137–147)

## 2013-12-04 MED ORDER — CARVEDILOL 6.25 MG PO TABS
6.2500 mg | ORAL_TABLET | Freq: Three times a day (TID) | ORAL | Status: DC
  Administered 2013-12-04 – 2013-12-05 (×3): 6.25 mg via ORAL
  Filled 2013-12-04 (×5): qty 1

## 2013-12-04 NOTE — Evaluation (Signed)
Occupational Therapy Evaluation Patient Details Name: Gilbert [name] MRN: [id number] DOB: 1950/08/20 Today's Date: 12/04/2013    History of Present Illness 63 year old male with PMH of seizures, HTN, and recent nephrectomy. Found unresponsive in recliner 4/27 with unknown down time, could have been as much as 2 hours. EMS was called. VF/VT at EMS arrival  which then became PEA arrest. Intubated, ACLS was performed. Reportedly 2 rounds of CPR and epi achieved ROSC. In ED found to have mildly elevated troponin;  4/28 Cardiogenic shock and emergency cath>>>DES stents to Left main and proximal LAD, Insertion of Impella CP Device; Impella out 4/30; post procedure and  through stay requiring less inotropic support; 5/01 Extubated successfully; PT and Cardiac Rehab ordered 5/2   Clinical Impression   PT admitted s/p found unresponsive and PEA arrest.. Pt currently with functional limitiations due to the deficits listed below (see OT problem list).  Pt will benefit from skilled OT to increase their independence and safety with adls and balance to allow discharge CIR.     Follow Up Recommendations  CIR    Equipment Recommendations  Other (comment) (defer to CIR)    Recommendations for Other Services       Precautions / Restrictions Precautions Precautions: Fall      Mobility Bed Mobility                  Transfers Overall transfer level: Needs assistance Equipment used: Rolling walker (2 wheeled) Transfers: Sit to/from Stand Sit to Stand: Max assist         General transfer comment: using a rocking momentum to complete sit<S.tand    Balance Overall balance assessment: Needs assistance Sitting-balance support: No upper extremity supported;Feet supported Sitting balance-Leahy Scale: Fair     Standing balance support: During functional activity;Bilateral upper extremity supported Standing balance-Leahy Scale: Poor                              ADL Overall  ADL's : Needs assistance/impaired     Grooming: Set up;Sitting (unable to static stand for grooming without steady support)               Lower Body Dressing:  (able to cross biL LE sitting in recliner)   Toilet Transfer: Maximal assistance;RW             General ADL Comments: Pt sitting in chair on arrival. pt squeezing pillow to cough. Pt agreeable to OT after educated purpose of OT and reason for arrival.      Vision                     Perception Perception Perception Tested?: No   Praxis      Pertinent Vitals/Pain No pain reported     Hand Dominance Right   Extremity/Trunk Assessment Upper Extremity Assessment Upper Extremity Assessment: Overall WFL for tasks assessed;Generalized weakness   Lower Extremity Assessment Lower Extremity Assessment: Defer to PT evaluation   Cervical / Trunk Assessment Cervical / Trunk Assessment: Normal   Communication Communication Communication: No difficulties   Cognition Arousal/Alertness: Awake/alert Behavior During Therapy: WFL for tasks assessed/performed Overall Cognitive Status: No family/caregiver present to determine baseline cognitive functioning       Memory: Decreased short-term memory             General Comments       Exercises       Shoulder Instructions  Home Living Family/patient expects to be discharged to:: Inpatient rehab Living Arrangements: Children Available Help at Discharge: Family;Available 24 hours/day Type of Home: House Home Access: Stairs to enter [employer] of Steps: 3 Entrance Stairs-Rails: None Home Layout: One level                          Prior Functioning/Environment Level of Independence: Independent             OT Diagnosis: Generalized weakness   OT Problem List: Decreased strength;Decreased activity tolerance;Impaired balance (sitting and/or standing);Decreased safety awareness;Decreased knowledge of use of DME or  AE;Cardiopulmonary status limiting activity   OT Treatment/Interventions: Self-care/ADL training;Therapeutic exercise;DME and/or AE instruction;Therapeutic activities;Patient/family education;Balance training    OT Goals(Current goals can be found in the care plan section) Acute Rehab OT Goals Patient Stated Goal: to go play golf OT Goal Formulation: With patient Time For Goal Achievement: 12/18/13 Potential to Achieve Goals: Good  OT Frequency: Min 2X/week   Barriers to D/C:            Co-evaluation              End of Session Nurse Communication: Mobility status;Precautions  Activity Tolerance: Patient tolerated treatment well Patient left: in chair;with call bell/phone within reach   Time: 1500-1515 OT Time Calculation (min): 15 min Charges:  OT General Charges $OT Visit: 1 Procedure OT Evaluation $Initial OT Evaluation Tier I: 1 Procedure G-Codes:    Peri Maris 12/27/2013, 3:19 PM Pager: [phone]

## 2013-12-04 NOTE — Progress Notes (Signed)
CSW spoke to patient and patient's daughter about the possibility of SNF as backup to CIR. Patient's daughter states she would like to discuss it further tomorrow once she speaks to CIR and hears from insurance.  Full assessment to follow.  Jeanette Caprice, MSW, [place]

## 2013-12-04 NOTE — Progress Notes (Signed)
Subjective/Complaints: Tolerating OT and cardiac rehab today.  Tolerated PT yesterday pm.  Stabe from cardiology standpoint per Dr Einar Gip.  Objective: Vital Signs: Blood pressure 107/57, pulse 90, temperature 97 F (36.1 C), temperature source Oral, resp. rate 20, height 5\' 8"  (1.727 m), weight 90.7 kg (199 lb 15.3 oz), SpO2 97.00%. No results found.  Recent Labs  12/02/13 1115  WBC 9.8  HGB 10.9*  HCT 31.5*  PLT 137*    Recent Labs  12/03/13 0452 12/04/13 0409  NA 142 139  K 3.8 4.1  CL 108 103  CO2 20 18*  GLUCOSE 86 95  BUN 23 23  CREATININE 1.11 1.05  CALCIUM 8.2* 8.5       Assessment/Plan: 1. Functional deficits secondary to Deconditioning and hypoxic encephalopathy which require 3+ hours per day of interdisciplinary therapy in a comprehensive inpatient rehab setting.  Physiatrist is providing close team supervision and 24 hour management of active medical problems listed below.  Physiatrist and rehab team continue to assess barriers to discharge/monitor patient progress toward functional and medical goals.  Rec admit to CIR in am with goals of supervision for all mobility.  Mod I Upper body ADLs and Sup lower body ADLs.  Mod I medication management with SLP and improve processing delay, memory and attention.  Estimated rehab LOS is 7-10 days FIM:                                      LOS: 8 days A FACE TO FACE EVALUATION WAS PERFORMED  Charlett Blake 12/04/2013, 4:10 PM

## 2013-12-04 NOTE — Progress Notes (Signed)
CARDIAC REHAB PHASE I   PRE:  Rate/Rhythm: 103 ST  BP:  Sitting: 112/70      SaO2: 93 RA  MODE:  Ambulation: 150 ft   POST:  Rate/Rhythm: 118 ST  BP:  Sitting: 106/56     SaO2: 96 RA [id number] Pt ambulated with RW x 2 assist with unsteady gait. Encouraged to not cross steps or "march" with walking. Pt denied complaints. Much more alert and engaged with ambulation today. Post walk, pt to chair with call bell in reach.   Kashmir Lysaght English PayneRN, BSN 12/04/2013 8:32 AM

## 2013-12-04 NOTE — Progress Notes (Addendum)
Rehab admissions - I met with pt and explained the possibility of inpatient rehab. Questions were answered and informational brochures were given. Pt asked that I give his daughter a call as well.  We have noted pt's increased activity tolerance and improved participation with therapies. With pt's permission, we will now open his case to seek insurance authorization with [employer].  I will keep the pt/family and medical team aware of any updates as we wait for insurance authorization. We will consider admitting to CIR pending insurance authorization and our bed availability.   Please call me with any questions. Lindsay, social worker and Julie, case manager aware. Thanks.   , PT Rehabilitation Admissions Coordinator [phone]    

## 2013-12-04 NOTE — Progress Notes (Addendum)
Physical Therapy Treatment Patient Details Name: Gilbert [name] MRN: [id number] DOB: 28-Oct-1950 Today's Date: 12/04/2013    History of Present Illness 63 year old male with PMH of seizures, HTN, and recent nephrectomy. Found unresponsive in recliner 4/27 with unknown down time, could have been as much as 2 hours. EMS was called. VF/VT at EMS arrival  which then became PEA arrest. Intubated, ACLS was performed. Reportedly 2 rounds of CPR and epi achieved ROSC. In ED found to have mildly elevated troponin;  4/28 Cardiogenic shock and emergency cath>>>DES stents to Left main and proximal LAD, Insertion of Impella CP Device; Impella out 4/30; post procedure and  through stay requiring less inotropic support; 5/01 Extubated successfully; PT and Cardiac Rehab ordered 5/2    PT Comments    Pt making good progress. Still requiring assist for all mobility and needs CIR for further therapy so he can eventually return home with family.  Follow Up Recommendations  CIR     Equipment Recommendations  Rolling walker with 5" wheels;3in1 (PT)    Recommendations for Other Services OT consult     Precautions / Restrictions Precautions Precautions: Fall    Mobility  Bed Mobility                  Transfers Overall transfer level: Needs assistance Equipment used: Rolling walker (2 wheeled) Transfers: Sit to/from Stand Sit to Stand: Min assist         General transfer comment: verbal cues for hand placement and assist for balance.  Ambulation/Gait Ambulation/Gait assistance: Min assist Ambulation Distance (Feet): 150 Feet Assistive device: Rolling walker (2 wheeled) Gait Pattern/deviations: Step-through pattern;Decreased stride length;Trunk flexed Gait velocity: Decreased Gait velocity interpretation: Below normal speed for age/gender General Gait Details: Verbal cues to stand more erect and to stay proper distance away from walker. Bil knee hyperextension in stance.   Stairs             Wheelchair Mobility    Modified Rankin (Stroke Patients Only)       Balance           Standing balance support: Bilateral upper extremity supported Standing balance-Leahy Scale: Poor                      Cognition Arousal/Alertness: Awake/alert Behavior During Therapy: WFL for tasks assessed/performed Overall Cognitive Status: No family/caregiver present to determine baseline cognitive functioning       Memory: Decreased short-term memory              Exercises      General Comments        Pertinent Vitals/Pain No c/o's of pain.    Home Living                      Prior Function            PT Goals (current goals can now be found in the care plan section) Progress towards PT goals: Progressing toward goals    Frequency  Min 3X/week    PT Plan Current plan remains appropriate    Co-evaluation             End of Session Equipment Utilized During Treatment: Gait belt Activity Tolerance: Patient tolerated treatment well Patient left: in chair;with call bell/phone within reach     Time: 1314-1325 PT Time Calculation (min): 11 min  Charges:  $Gait Training: 8-22 mins  G Codes:      [place] 12/04/2013, 1:44 PM  [place]

## 2013-12-04 NOTE — Progress Notes (Addendum)
Subjective:  Patient is presently doing well. Ambulated with the help of cardiac rehabilitation. Needs assistance with ambulation. Overall feels that his energy level is improved. Less chest pain on coughing. No fever, no significant arrhythmias on the telemetry. Objective:  Vital Signs in the last 24 hours: Temp:  [97.6 F (36.4 C)-98.5 F (36.9 C)] 97.6 F (36.4 C) (05/05 0348) Pulse Rate:  [92-104] 104 (05/05 0348) Resp:  [20-22] 22 (05/05 0348) BP: (136-149)/(75-91) 147/75 mmHg (05/05 0348) SpO2:  [96 %-97 %] 96 % (05/05 0348)  Intake/Output from previous day: 05/04 0701 - 05/05 0700 In: 400 [P.O.:400] Out: 525 [Urine:525]  Physical Exam:   General appearance: alert, cooperative, appears stated age, no distress and responds appropriately Eyes: negative findings: lids and lashes normal Neck: no adenopathy and no carotid bruit Neck: JVP - normal, carotids 2+= without bruits Resp: clear to auscultation bilaterally chest wall is still mildly tender. Cardio: regular rate and rhythm, S1, S2 normal, no murmur, click, rub or gallop, distant heart sounds. GI: soft, non-tender; bowel sounds normal; no masses,  no organomegaly Extremities: extremities normal, atraumatic, no cyanosis or edema and the foot is warm bilateral. Neuro: Alert oriented x3 without any focal neurologic deficit.   Lab Results: BMP  Recent Labs  12/02/13 0710 12/03/13 0452 12/04/13 0409  NA 142 142 139  K 5.1 3.8 4.1  CL 108 108 103  CO2 15* 20 18*  GLUCOSE 69* 86 95  BUN 25* 23 23  CREATININE 1.25 1.11 1.05  CALCIUM 8.7 8.2* 8.5  GFRNONAA 60* 69* 74*  GFRAA 69* 80* 85*    CBC  Recent Labs Lab 12/02/13 1115  WBC 9.8  RBC 3.86*  HGB 10.9*  HCT 31.5*  PLT 137*  MCV 81.6  MCH 28.2  MCHC 34.6  RDW 14.7    HEMOGLOBIN A1C No results found for this basename: HGBA1C,  MPG    Cardiac Panel (last 3 results)  Recent Labs  11/29/13 0900 11/29/13 1500 11/29/13 2145  TROPONINI >20.00*  >20.00* >20.00*    BNP (last 3 results) No results found for this basename: PROBNP,  in the last 8760 hours  TSH  Recent Labs  11/27/13 0400  TSH 0.961    CHOLESTEROL  Recent Labs  11/30/13 0435  CHOL 137   Lipid Panel     Component Value Date/Time   CHOL 137 11/30/2013 0435   TRIG 167* 11/30/2013 0435   HDL 30* 11/30/2013 0435   CHOLHDL 4.6 11/30/2013 0435   VLDL 33 11/30/2013 0435   LDLCALC 74 11/30/2013 0435     Hepatic Function Panel  Recent Labs  11/28/13 0400 11/29/13 0440 11/30/13 0435 12/02/13 0710  PROT 4.7* 5.0* 5.2* 6.0  ALBUMIN 2.0* 2.0* 2.0* 2.4*  AST 2068* 595* 301* 80*  ALT 1165* 990* 829* 356*  ALKPHOS 123* 112 112 139*  BILITOT 0.4 0.3 0.4 0.5  BILIDIR  --  <0.2  --   --   IBILI  --  NOT CALCULATED  --   --     Cardiac Studies: EKG 11/28/2013: Sinus tachycardia at the rate of 104 bpm, rightward axis, left posterior fascicular block, right bundle branch block.  Nonspecific T abnormality.  Low voltage complexes. EKG 11/29/2013: Normal sinus rhythm, nonspecific T abnormality. Tele: NSR. No VT  Assessment/Plan:  1.  Cardiogenic shock secondary to multilevel vessel critical coronary artery disease. 2.  CAD of the native vessels, successful PTCA and stenting of the left main, proximal LAD with implantation of a  4.0 x 18 mm Xience alpine stent.  3.  Multiorgan failure, acute renal failure, shock liver due to cardiac shock.  Resolved. 4. Hypertension.   Recommendation: Patient is presently doing well and tolerating all his cardiac medications without any adverse events. I will increase his Coreg to 12.5 mg by mouth twice a day if TID 6.25 tolerated today as his blood pressure is still slightly elevated. His serum creatinine is back to his baseline. From cardiac standpoint I will recheck his echocardiogram for left ventricular systolic function, if still depressed, patient will need Life-Vest or discharge.  Appreciate physical medicine and rehabilitation  consultation, patient appears to be stable for discharge in the next 24-48 hours from cardiac standpoint.   [name], M.D. 12/04/2013, 12:44 PM [facility] Pager: [phone] Office: [phone] If no answer: [phone]

## 2013-12-05 LAB — COMPREHENSIVE METABOLIC PANEL
ALT: 109 U/L — AB (ref 0–53)
AST: 30 U/L (ref 0–37)
Albumin: 2.3 g/dL — ABNORMAL LOW (ref 3.5–5.2)
Alkaline Phosphatase: 123 U/L — ABNORMAL HIGH (ref 39–117)
BUN: 23 mg/dL (ref 6–23)
CALCIUM: 8.3 mg/dL — AB (ref 8.4–10.5)
CO2: 20 mEq/L (ref 19–32)
CREATININE: 1.14 mg/dL (ref 0.50–1.35)
Chloride: 102 mEq/L (ref 96–112)
GFR calc non Af Amer: 67 mL/min — ABNORMAL LOW (ref >90)
GFR, EST AFRICAN AMERICAN: 77 mL/min — AB (ref >90)
GLUCOSE: 113 mg/dL — AB (ref 70–99)
Potassium: 3.9 mEq/L (ref 3.7–5.3)
Sodium: 134 mEq/L — ABNORMAL LOW (ref 137–147)
TOTAL PROTEIN: 5.5 g/dL — AB (ref 6.0–8.3)
Total Bilirubin: 0.3 mg/dL (ref 0.3–1.2)

## 2013-12-05 MED ORDER — CARVEDILOL 12.5 MG PO TABS
12.5000 mg | ORAL_TABLET | Freq: Two times a day (BID) | ORAL | Status: DC
  Administered 2013-12-05 – 2013-12-07 (×4): 12.5 mg via ORAL
  Filled 2013-12-05 (×6): qty 1

## 2013-12-05 NOTE — Progress Notes (Signed)
Rehab admissions - I spoke with pt's daughter by phone yesterday afternoon and answered her questions about acute inpatient rehab. This am, I submitted the OT eval and latest rehab MD note to insurance.  I continue to wait for authorization from [facility] and will keep the pt/family and medical team aware of any updates.  Please call me with any questions. Thanks.  Nanetta Batty, PT Rehabilitation Admissions Coordinator [phone]

## 2013-12-05 NOTE — Progress Notes (Signed)
Clinical Social Work Department BRIEF PSYCHOSOCIAL ASSESSMENT 12/05/2013  Patient:  Gilbert [name], Gilbert [name]     Account Number:  [id number]     Admit date:  11/26/2013  Clinical Social Worker:  Megan Salon  Date/Time:  12/05/2013 08:26 AM  Referred by:  RN  Date Referred:  12/04/2013 Referred for  SNF Placement   Other Referral:   Interview type:  Other - See comment Other interview type:   CSW spoke to patient who deferred questions to his daughter    PSYCHOSOCIAL DATA Living Status:  ALONE Admitted from facility:   Level of care:   Primary support name:  Gilbert [name] Primary support relationship to patient:  CHILD, ADULT Degree of support available:   Good    CURRENT CONCERNS Current Concerns  Post-Acute Placement   Other Concerns:    SOCIAL WORK ASSESSMENT / PLAN CSW received consult for a backup SNF placement in the event that  insurance denies patient going to CIR. CSW went into patient's room and introduced self and explained reason for visit. Patient asked social worker to speak to his daughter who will be in the room within the hour. CSW then received a phone call from patient's daughter.  CSW explained role and SNF process to patient's daughter. Patient's daughter states she thinks it is best if patient goes to CIR and really wants that. CSW explained that there is a chance insurance Gilbert [name] be denied and in that event, encouraged daughter to think about SNF or Home Health as a back up plan. Daughter states she will consider her options once insurance is denied. CSW continues to follow.   Assessment/plan status:  Psychosocial Support/Ongoing Assessment of Needs Other assessment/ plan:   Information/referral to community resources:   CSW information/SNF information    PATIENT'S/FAMILY'S RESPONSE TO PLAN OF CARE: Patient asked social worker to speak to daughter in regards to plans of care. Patient and family would like to go to [name] pending insurance auth.        Jeanette Caprice, MSW, [place]

## 2013-12-05 NOTE — Progress Notes (Signed)
CARDIAC REHAB PHASE I   PRE:  Rate/Rhythm:94 SR  BP:  Sitting: 118/66      SaO2: 92 RA  MODE:  Ambulation: 300 ft   POST:  Rate/Rhythm: 101 ST  BP:  Sitting: 120/70     SaO2: 95 RA  1435-1305 Patient ambulated with RW x 2 assist. Moderately unsteady gait noted. Pt tends to "sling" right leg forward with ambulation. Pt denied complaints during walk. Post ambulation pt to recliner with call bell and phone in reach. Pt daughter called room and pt requested RN speak with daughter who stated insurance would not pay for Inpatient rehab. Will notify case manager.  Calyn Sivils English PayneRN, BSN 12/05/2013 2:59 PM

## 2013-12-05 NOTE — Progress Notes (Signed)
Occupational Therapy Treatment Patient Details Name: Gilbert [name] MRN: [id number] DOB: 1951/03/31 Today's Date: 12/05/2013    History of present illness 63 year old male with PMH of seizures, HTN, and recent nephrectomy. Found unresponsive in recliner 4/27 with unknown down time, could have been as much as 2 hours. EMS was called. VF/VT at EMS arrival  which then became PEA arrest. Intubated, ACLS was performed. Reportedly 2 rounds of CPR and epi achieved ROSC. In ED found to have mildly elevated troponin;  4/28 Cardiogenic shock and emergency cath>>>DES stents to Left main and proximal LAD, Insertion of Impella CP Device; Impella out 4/30; post procedure and  through stay requiring less inotropic support; 5/01 Extubated successfully; PT and Cardiac Rehab ordered 5/2   OT comments  Pt is currently progressing toward goals and remains with balance deficits. Pt was independent PTA. Pt has narrowed base of support and decr activity tolerance.   Follow Up Recommendations  CIR    Equipment Recommendations  Other (comment) (defer to CIR)    Recommendations for Other Services      Precautions / Restrictions Precautions Precautions: Fall       Mobility Bed Mobility               General bed mobility comments: in chair on arrival  Transfers Overall transfer level: Needs assistance Equipment used: Rolling walker (2 wheeled) Transfers: Sit to/from Stand Sit to Stand: Mod assist         General transfer comment: requires use of BIL UE and scoot to edge of chair to power up into standing    Balance Overall balance assessment: Needs assistance         Standing balance support: Bilateral upper extremity supported;During functional activity Standing balance-Leahy Scale: Fair                     ADL Overall ADL's : Needs assistance/impaired     Grooming: Wash/dry hands;Min guard;Standing (using UE support on counter) Grooming Details (indicate cue type and reason):  Pt requires LB against cabinet support for reaching outside base of support             Lower Body Dressing: Min guard;Sitting/lateral leans Lower Body Dressing Details (indicate cue type and reason): able to cross BIL LE to reach socks Toilet Transfer: Moderate assistance (bil UE used)           Functional mobility during ADLs: Minimal assistance;Rolling walker General ADL Comments: Pt required x2 (A) to control descend to chair with incr independence. pt with fast uncontrolled descend. Pt ambulating unit with scissoring gait and narrowed base of support. Pt stopping to rest x2 during ~100 ft . Pt reports pain in left foot as "it got stuck a moment"      Vision                 Additional Comments: reports seeing black dots but when attempting to ask more questions reports "they are gone"   Perception     Praxis      Cognition   Behavior During Therapy: [facility] for tasks assessed/performed Overall Cognitive Status: No family/caregiver present to determine baseline cognitive functioning Area of Impairment: Memory;Safety/judgement     Memory: Decreased short-term memory    Safety/Judgement: Decreased awareness of deficits          Extremity/Trunk Assessment               Exercises     Shoulder Instructions  General Comments      Pertinent Vitals/ Pain       None reported- HR 106 during session max observed   Home Living                                          Prior Functioning/Environment              Frequency Min 2X/week     Progress Toward Goals  OT Goals(current goals can now be found in the care plan section)  Progress towards OT goals: Progressing toward goals  Acute Rehab OT Goals Patient Stated Goal: to go play golf OT Goal Formulation: With patient Time For Goal Achievement: 12/18/13 Potential to Achieve Goals: Good ADL Goals Pt Will Perform Grooming: with min assist;standing Pt Will Perform Upper  Body Bathing: with min assist;standing Pt Will Perform Lower Body Bathing: with min assist;sit to/from stand Pt Will Transfer to Toilet: with min assist;bedside commode Additional ADL Goal #1: Pt will complete bed mobility supervision level  Plan Discharge plan remains appropriate    Co-evaluation                 End of Session Equipment Utilized During Treatment: Gait belt;Rolling walker   Activity Tolerance Patient tolerated treatment well   Patient Left in chair;with call bell/phone within reach   Nurse Communication Mobility status;Precautions        Time: 6789-3810 OT Time Calculation (min): 14 min  Charges: OT General Charges $OT Visit: 1 Procedure OT Treatments $Therapeutic Activity: 8-22 mins  Peri Maris 12/05/2013, 1:15 PM Pager: [phone]

## 2013-12-05 NOTE — Progress Notes (Signed)
Echo Lab  Limited 2D Echocardiogram completed.  [place], RDCS 12/05/2013 8:49 AM

## 2013-12-05 NOTE — Progress Notes (Signed)
CSW spoke to patient's daughter to provide bed offers. Patient's daughter states that she is going to look at her choices and let social worker know. CSW will then have to get insurance authorization and make sure a facility can take patient with a life vest. CSW continues to follow.  Jeanette Caprice, MSW, [place]

## 2013-12-05 NOTE — Progress Notes (Addendum)
Subjective:  Patient is presently doing well. Ambulated with the help of cardiac rehabilitation. Needs assistance with ambulation. Overall feels that his energy level is improved. Less chest pain on coughing. No fever, no significant arrhythmias on the telemetry.  Patient states that his strength is also improving and is very pleased to know that he will be going to rehabilitation soon. Objective:  Vital Signs in the last 24 hours: Temp:  [97 F (36.1 C)-98 F (36.7 C)] 97.9 F (36.6 C) (05/06 0434) Pulse Rate:  [87-91] 91 (05/06 0434) Resp:  [17-20] 17 (05/06 0434) BP: (107-129)/(57-73) 129/73 mmHg (05/06 0434) SpO2:  [92 %-97 %] 92 % (05/06 0434)  Intake/Output from previous day: 05/05 0701 - 05/06 0700 In: 240 [P.O.:240] Out: 525 [Urine:525]  Physical Exam:   General appearance: alert, cooperative, appears stated age, no distress and responds appropriately Eyes: negative findings: lids and lashes normal Neck: no adenopathy and no carotid bruit Neck: JVP - normal, carotids 2+= without bruits Resp: clear to auscultation bilaterally chest wall is still mildly tender. Cardio: regular rate and rhythm, S1, S2 normal, no murmur, click, rub or gallop, distant heart sounds. GI: soft, non-tender; bowel sounds normal; no masses,  no organomegaly Extremities: extremities normal, atraumatic, no cyanosis or edema and the foot is warm bilateral. Neuro: Alert oriented x3 without any focal neurologic deficit.   Lab Results: BMP  Recent Labs  12/02/13 0710 12/03/13 0452 12/04/13 0409  NA 142 142 139  K 5.1 3.8 4.1  CL 108 108 103  CO2 15* 20 18*  GLUCOSE 69* 86 95  BUN 25* 23 23  CREATININE 1.25 1.11 1.05  CALCIUM 8.7 8.2* 8.5  GFRNONAA 60* 69* 74*  GFRAA 69* 80* 85*    CBC  Recent Labs Lab 12/02/13 1115  WBC 9.8  RBC 3.86*  HGB 10.9*  HCT 31.5*  PLT 137*  MCV 81.6  MCH 28.2  MCHC 34.6  RDW 14.7    HEMOGLOBIN A1C No results found for this basename: HGBA1C,  MPG     Cardiac Panel (last 3 results)  Recent Labs  11/29/13 0900 11/29/13 1500 11/29/13 2145  TROPONINI >20.00* >20.00* >20.00*    BNP (last 3 results) No results found for this basename: PROBNP,  in the last 8760 hours  TSH  Recent Labs  11/27/13 0400  TSH 0.961    CHOLESTEROL  Recent Labs  11/30/13 0435  CHOL 137   Lipid Panel     Component Value Date/Time   CHOL 137 11/30/2013 0435   TRIG 167* 11/30/2013 0435   HDL 30* 11/30/2013 0435   CHOLHDL 4.6 11/30/2013 0435   VLDL 33 11/30/2013 0435   LDLCALC 74 11/30/2013 0435     Hepatic Function Panel  Recent Labs  11/28/13 0400 11/29/13 0440 11/30/13 0435 12/02/13 0710  PROT 4.7* 5.0* 5.2* 6.0  ALBUMIN 2.0* 2.0* 2.0* 2.4*  AST 2068* 595* 301* 80*  ALT 1165* 990* 829* 356*  ALKPHOS 123* 112 112 139*  BILITOT 0.4 0.3 0.4 0.5  BILIDIR  --  <0.2  --   --   IBILI  --  NOT CALCULATED  --   --     Cardiac Studies: EKG 11/28/2013: Sinus tachycardia at the rate of 104 bpm, rightward axis, left posterior fascicular block, right bundle branch block.  Nonspecific T abnormality.  Low voltage complexes. EKG 11/29/2013: Normal sinus rhythm, nonspecific T abnormality.  Echocardiogram 12/05/2013: moderate to severe decrease in left frontal systolic function, ejection fraction 30-35%.  Akinesis  of the entire anterior and anteroseptal wall.  Hyperdynamic inferior wall.  No significant valvular abnormality.  Compared to the echocardiogram done previously a week ago, EF improved from 20-25% to the present 30-35%. Tele: NSR. No VT  Assessment/Plan:  1.  Cardiogenic shock secondary to multilevel vessel critical coronary artery disease. 2.  CAD of the native vessels, successful PTCA and stenting of the left main, proximal LAD with implantation of a 4.0 x 18 mm Xience alpine stent.  3.  Multiorgan failure, acute renal failure, shock liver due to cardiac shock.  Resolved. 4. Hypertension.  5.  Deconditioning due to multiple complex medical  condition.  Needs inpatient rehabilitation.  Recommendation: I will increase his Coreg to 12.5 mg by mouth twice a day.  Tolerated 3 times a day dosing of 6.125 mg.  Reviewed the echocardiogram, his infection fraction is improved marginally.  He will need life vest to be placed prior to discharge to cardiac rehabilitation.  He is stable from cardiac standpoint to be discharged, however we're awaiting insurance clearance for rehabilitation.  No changes in the medications except for addition of Coreg.  Increase the patient to cough.  I will obtain CMP today to make sure that the serum creatinine is stable and LFTs are coming down.  [name], M.D. 12/05/2013, 11:53 AM [facility] Pager: [phone] Office: [phone] If no answer: [phone]

## 2013-12-05 NOTE — Progress Notes (Signed)
Clinical Social Work Department CLINICAL SOCIAL WORK PLACEMENT NOTE 12/05/2013  Patient:  Gilbert [name],Gilbert [name]  Account Number:  [id number] Admit date:  11/26/2013  Clinical Social Worker:  Megan Salon  Date/time:  12/05/2013 08:31 AM  Clinical Social Work is seeking post-discharge placement for this patient at the following level of care:   SKILLED NURSING   (*CSW will update this form in Epic as items are completed)   12/05/2013  Patient/family provided with [place] Department of Clinical Social Work's list of facilities offering this level of care within the geographic area requested by the patient (or if unable, by the patient's family).  12/05/2013  Patient/family informed of their freedom to choose among providers that offer the needed level of care, that participate in Medicare, Medicaid or managed care program needed by the patient, have an available bed and are willing to accept the patient.  12/05/2013  Patient/family informed of [facility]' ownership interest in [facility], as well as of the fact that they are under no obligation to receive care at this facility.  PASARR submitted to EDS on  PASARR number received from [place] on   FL2 transmitted to all facilities in geographic area requested by pt/family on  12/05/2013 FL2 transmitted to all facilities within larger geographic area on   Patient informed that his/her managed care company has contracts with or will negotiate with  certain facilities, including the following:     Patient/family informed of bed offers received:   Patient chooses bed at  Physician recommends and patient chooses bed at    Patient to be transferred to  on   Patient to be transferred to facility by   The following physician request were entered in Epic:   Additional Comments:  Jeanette Caprice, MSW, [place]

## 2013-12-05 NOTE — Progress Notes (Signed)
Rehab admissions - I received a denial for inpatient rehab from [facility]. I updated Almyra Free, case Freight forwarder and Ria Comment, social worker to share this information. I met with pt and shared the news of the denial as well.  I also called and spoke with pt's daughter Estill Bamberg by phone and explained that [facility] denied inpatient rehab due to pt making improved progress overall and not qualifying for the intensity of acute rehab (needing minimal assist to ambulate 150' and min guard with ADL tasks).  I will sign off case and pt will be followed up by Ria Comment for [facility]. Please call if there are any questions. Thanks.  Nanetta Batty, PT Rehabilitation Admissions Coordinator [phone]

## 2013-12-06 MED ORDER — CARVEDILOL 12.5 MG PO TABS: 12.5000 mg | ORAL_TABLET | Freq: Two times a day (BID) | ORAL | Status: DC

## 2013-12-06 MED ORDER — LISINOPRIL 20 MG PO TABS: 10.0000 mg | ORAL_TABLET | Freq: Every day | ORAL | Status: DC

## 2013-12-06 MED ORDER — TICAGRELOR 90 MG PO TABS
90.0000 mg | ORAL_TABLET | Freq: Two times a day (BID) | ORAL | Status: DC
Start: 2013-12-06 — End: 2013-12-11

## 2013-12-06 MED ORDER — ATORVASTATIN CALCIUM 10 MG PO TABS: 10.0000 mg | ORAL_TABLET | Freq: Every day | ORAL | Status: DC

## 2013-12-06 NOTE — Progress Notes (Signed)
CSW spoke to patient's daughter over the phone to inform daughter of SNF offers who take patients with BCBS and a life vest. Patients daughter stated she wants to talk to her sister and call social worker back.  Jeanette Caprice, MSW, [place]

## 2013-12-06 NOTE — Discharge Summary (Signed)
Physician Discharge Summary  Patient ID: Gilbert [name] MRN: [id number] DOB/AGE: 01-25-1951 63 y.o.  Admit date: 11/26/2013 Discharge date: 12/07/2013  Primary Discharge Diagnosis C-Arrest: V fib/V tach/PEA with need for CPR Ischemic cardiomyopathy PTCA/Stenting of LM and Proximal LAD with 4.0 x 18 mm Xience alpine stent. Abdominal aortogram, limited bilateral femoral arteriogram to evaluate for PAD for insertion of Impella Circulatory assist device. Secondary Discharge Diagnosis 1. Multi-organ failure, acute respiratory failure, acute renal failure, shock liver, lactic acidosis due to cardiac arrest, completely resolved. 2. Malnutrition, severe hypoalbuminemia 4. History of hypertension  5. History of renal carcinoma status post right nephrectomy done recently. 6.  Failure to thrive, in adult.  Generalized weakness due to underlying comorbidities, improving.  Insurance denial for inpatient rehabilitation.   Significant Diagnostic Studies: 11/27/2013: Procedure performed: Selective right and left coronary angiography, LV gram. Placement of circulatory support, Impella CP via left femoral access, PTCA and stenting of the left main, proximal LAD with implantation of a 4.0 x 18 mm Xience alpine stent. Abdominal aortogram, limited bilateral femoral arteriogram to evaluate for PAD for insertion of Impella Circulatory assist device.  Left main coronary artery: High-grade mid and distal 90% stenosis.  LAD: Large vessel, has ostial 90% stenosis. Mid to distal segment shows very mild luminal irregularity. Gives origin to several small diagonals.  Ramus intermediate: Large vessel. Midsegment of the ramus intermediate shows mild 10-20% luminal irregularity.  Circumflex coronary artery: A small vessel. Severely diffusely diseased. Occluded in the midsegment. Faint collaterals with visualization of obtuse marginal is evident.  Right coronary artery probably dominant vessel, severely diseased distally.  Midsegment shows a high-grade 90% stenosis. After the origin of a second marginal branch, the right coronary artery is severely diffusely diseased and occluded with faint visualization of the distal bed. Contralateral collaterals are present to the distal RCA under evident during injection of the left coronary system.  LV gram: Severe LV systolic dysfunction, ejection fraction 20% with global hypokinesis and anterior and anteroapical akinesis. Mitral regurgitation could not be assessed due to hand injection.  Interventional data Successful PTCA and stenting of the left main coronary artery and proximal LAD with implantation of a 4.0 x 18 mm Xience alpine drug-eluting stent deployed at a peak of 12 atmospheric pressure. Stenosis reduced from 90% to 0% with brisk TIMI-3 to TIMI-3 flow maintained at the end of the procedure.  Insertion of Impella CP Device: Prior to proceeding with intervention, we decided to have circulatory support, advanced into lower in the usual fashion after obtaining left femoral arterial access. After placement of the device, immediate improvement in cardiac output   Consults: Dr. Sherren Mocha during coronary angiogram and angioplasty. His assist appreciated. Pulmonary critical care: Patient initially admitted under the CCM for management of cardiac arrest and respiratory arrest.  Hospital Course: On 11/27/2013 a 63 year old male with PMH of seizures, HTN, and recent nephrectomy. Found unresponsive in recliner 4/27 with unknown down time, could have been as much as 2 hours. EMS was called. VF/VT at EMS arrival (per NP reports on this were very conflicting if happened or not) which then became PEA arrest. Intubated, ACLS was performed. Reportedly 2 rounds of CPR and epi achieved ROSC. In ED found to have mildly elevated troponin.  I saw the patient in the afternoon, due to worsening shock, elevated troponin, and echocardiogram revealing wall motion abnormalities suggestive of  Anterior  ischemia/infarct, global hypokinesis, ejection fraction 20-25%, patient was taken urgently to the cardiac catheterization lab and underwent successful  revascularization of a high-grade LAD and left main disease.  Patient also received circulatory support with Impella for 72 hours approximately, patient was able to be weaned off of all pressors immediately except for dopamine  Which was weaned off of 48 hours, patient's hemodynamics, Lites imbalance, metabolic acidosis RESOLVED. Patient had multiorgan failure with acute renal failure, shock liver, anemia.  However these also resolved, serum creatinine back to baseline.  Patient was transferred to telemetry and cardiac rehabilitation and inpatient rehabilitation and physical medicine was consulted.  Unfortunately insurance denial led to him being discharged.  On 12/05/2013, repeat echocardiogram revealed improved ejection fraction from 20% to 35%, however due to cardiac arrest and decreased EF, Life-Vest fitting and discharge with Life-Vest was felt to be appropriate. Patient will need cardiac rehabilitation and outpatient physical therapy/ECF placement for short period of time.  Recommendation on discharge: Patient previously on pravastatin, discontinued and placed on low-dose of atorvastatin.  High-dose was not utilized due to acute hepatic injury.  Metoprolol was also changed to carvedilol. I will continue to see the patient in the rehabilitation, patient needs titration of his medications. He will need BMP and CBC in 3-4 days.  Discharge Exam: Blood pressure 110/72, pulse 91, temperature 98.4 F (36.9 C), temperature source Oral, resp. rate 18, height 5\' 8"  (1.727 m), weight 90.7 kg (199 lb 15.3 oz), SpO2 95.00%.   General appearance: alert, cooperative, appears stated age, no distress and responds appropriately  Eyes: negative findings: lids and lashes normal  Neck: no adenopathy and no carotid bruit  Neck: JVP - normal, carotids 2+= without bruits   Resp: clear to auscultation bilaterally chest wall is still mildly tender.  Cardio: regular rate and rhythm, S1, S2 normal, no murmur, click, rub or gallop, distant heart sounds.  GI: soft, non-tender; bowel sounds normal; no masses, no organomegaly  Extremities: extremities normal, atraumatic, no cyanosis or edema and the foot is warm bilateral.  Neuro: Alert oriented x3 without any focal neurologic deficit.   Labs:   Lab Results  Component Value Date   WBC 9.8 12/02/2013   HGB 10.9* 12/02/2013   HCT 31.5* 12/02/2013   MCV 81.6 12/02/2013   PLT 137* 12/02/2013    Recent Labs Lab 12/05/13 1305  NA 134*  K 3.9  CL 102  CO2 20  BUN 23  CREATININE 1.14  CALCIUM 8.3*  PROT 5.5*  BILITOT 0.3  ALKPHOS 123*  ALT 109*  AST 30  GLUCOSE 113*   Lab Results  Component Value Date   TROPONINI >20.00* 11/29/2013    Lipid Panel     Component Value Date/Time   CHOL 137 11/30/2013 0435   TRIG 167* 11/30/2013 0435   HDL 30* 11/30/2013 0435   CHOLHDL 4.6 11/30/2013 0435   VLDL 33 11/30/2013 0435   LDLCALC 74 11/30/2013 0435    EKG 11/28/2013: Sinus tachycardia at the rate of 104 bpm, rightward axis, left posterior fascicular block, right bundle branch block. Nonspecific T abnormality. Low voltage complexes.  EKG 11/29/2013: Normal sinus rhythm, nonspecific T abnormality.   Echocardiogram 12/05/2013: moderate to severe decrease in left frontal systolic function, ejection fraction 30-35%. Akinesis of the entire anterior and anteroseptal wall. Hyperdynamic inferior wall. No significant valvular abnormality. Compared to the echocardiogram done previously a week ago, EF improved from 20-25% to the present 30-35%.    Radiology: Ct Head Wo Contrast  11/27/2013   CLINICAL DATA:  Status post CPR.   IMPRESSION: 1. No acute intracranial pathology seen on CT. 2.  Gray-white differentiation is preserved; no definite immediate CT evidence to suggest anoxic brain injury. 3. Mild small vessel ischemic microangiopathy.  4. Mild bilateral proptosis noted.   Electronically Signed   By: Garald Balding M.D.   On: 11/27/2013 01:33   Ct Angio Chest Pe W/cm &/or Wo Cm  11/27/2013   CLINICAL DATA:  Hypoxia.  Respiratory failure.  Status post CPR.   IMPRESSION: 1. No evidence of pulmonary embolus. 2. Partial consolidation of both lower lung lobes, with associated air bronchograms, raising concern for pneumonia. Given recent CPR, this could reflect aspiration. 3. Mild emphysematous change at the upper lung lobes. 4. Endotracheal tube seen ending 7 cm above the carina. This could be advanced 4 cm, as deemed clinically appropriate.   Electronically Signed   By: Garald Balding M.D.   On: 11/27/2013 01:48   Dg Chest Port 1 View  12/02/2013   CLINICAL DATA:  Respiratory failure.  EXAM: PORTABLE CHEST - 1 VIEW  COMPARISON:  DG CHEST 1V PORT dated 11/30/2013   IMPRESSION: 1. Interim removal of lines and tubes. 2. Congestive heart failure with mild pulmonary interstitial edema and small pleural effusions.   Electronically Signed   By: Marcello Moores  Register   On: 12/02/2013 07:23   Dg Chest Port 1 View  11/30/2013   CLINICAL DATA:  Respiratory failure; hypoxia  EXAM: PORTABLE CHEST - 1 VIEW  COMPARISON:  November 29, 2013  IMPRESSION: Cardiomegaly with effusions. Suspect congestive heart failure. There is consolidation in both lung bases, stable. No new opacity. Tube and catheter positions are as described without pneumothorax.   Electronically Signed   By: Lowella Grip M.D.   On: 11/30/2013 07:57   FOLLOW UP PLANS AND APPOINTMENTS    Medication List    STOP taking these medications       metoprolol tartrate 25 MG tablet  Commonly known as:  LOPRESSOR     pravastatin 40 MG tablet  Commonly known as:  PRAVACHOL      TAKE these medications       aspirin EC 81 MG tablet  Take 81 mg by mouth daily.     atorvastatin 10 MG tablet  Commonly known as:  LIPITOR  Take 1 tablet (10 mg total) by mouth daily at 6 PM.     carvedilol 12.5  MG tablet  Commonly known as:  COREG  Take 1 tablet (12.5 mg total) by mouth 2 (two) times daily with a meal.     docusate sodium 100 MG capsule  Commonly known as:  COLACE  Take 100 mg by mouth 2 (two) times daily.     lansoprazole 30 MG capsule  Commonly known as:  PREVACID  Take 30 mg by mouth daily.     lisinopril 20 MG tablet  Commonly known as:  PRINIVIL,ZESTRIL  Take 0.5 tablets (10 mg total) by mouth at bedtime.     phenytoin 100 MG ER capsule  Commonly known as:  DILANTIN  Take 300 mg by mouth daily.     probenecid 500 MG tablet  Commonly known as:  BENEMID  Take 500 mg by mouth daily.     ticagrelor 90 MG Tabs tablet  Commonly known as:  BRILINTA  Take 1 tablet (90 mg total) by mouth 2 (two) times daily.           Follow-up Information   Call [name], MD. (To be seen in 10 days)    Specialty:  Cardiology   Contact information:  [address] [place] [address] [phone]        [name], MD 12/06/2013, 5:54 AM  Pager: [phone] Office: [phone] If no answer: [phone]

## 2013-12-06 NOTE — Progress Notes (Signed)
[id number] Education completed with patient and 2 daughters. Excellent teach back noted from family, however pt needs reinforcement. Pt does live with daughter Gilbert [name] and she will take responsibility for care if pt discharged to home. Stent card given. Pt expressed interest in phase 2 cardiac rehab, and verbalized request to come to [facility] program even though he lives closer to [place]. With pt consent, order placed for phase 2. Pt daughters did express concern for pt safety if he is discharged home instead of to a rehab facility. Gilbert [name] does work, and has 2 children old enough to stay by themselves, but not old enough to help take care of pt. Encouraged daughters to contact SW as RN has no information regarding pt disposition at this time.

## 2013-12-06 NOTE — Progress Notes (Signed)
PT Cancellation Note  Patient Details Name: Gilbert [name] MRN: [id number] DOB: 06-Jun-1951   Cancelled Treatment:    Reason Eval/Treat Not Completed: Other (comment) (Pt just seen by Cardiac Rehab)   [place] 12/06/2013, 4:26 PM

## 2013-12-06 NOTE — Progress Notes (Signed)
Rehab admissions - I have been in contact with Almyra Free, case manager and Ria Comment, Education officer, museum today about pt's case. We had received a denial from [facility] for CIR. This afternoon, Dr. Einar Gip completed a peer to peer review to appeal BCBS denial for inpatient rehab and [employer] has now given approval for CIR.   Pt has been fitted with Lifevest this afternoon and I will check on pt's medical status tomorrow. Pending pt's medical clearance and our bed availability, we will plan to bring pt to CIR tomorrow.  Almyra Free and Ria Comment are aware of these updates. Please call me with any questions. Thanks.  Nanetta Batty, PT Rehabilitation Admissions Coordinator [phone]

## 2013-12-06 NOTE — Progress Notes (Signed)
Subjective:  Patient is presently doing well. Ambulated with the help of cardiac rehabilitation. Needs assistance with ambulation. Overall feels that his energy level is improved. Less chest pain on coughing. No fever, no significant arrhythmias on the telemetry.  Patient's daughter and left a line of questions.  Objective:  Vital Signs in the last 24 hours: Temp:  [98.1 F (36.7 C)-99 F (37.2 C)] 98.4 F (36.9 C) (05/07 0439) Pulse Rate:  [86-93] 91 (05/07 0439) Resp:  [18-19] 18 (05/07 0439) BP: (106-115)/(47-72) 110/72 mmHg (05/07 0439) SpO2:  [94 %-98 %] 95 % (05/07 0439)  Intake/Output from previous day: 05/06 0701 - 05/07 0700 In: 720 [P.O.:720] Out: 1800 [Urine:1800]  Physical Exam:   General appearance: alert, cooperative, appears stated age, no distress and responds appropriately Eyes: negative findings: lids and lashes normal Neck: no adenopathy and no carotid bruit Neck: JVP - normal, carotids 2+= without bruits Resp: clear to auscultation bilaterally chest wall is still mildly tender. Cardio: regular rate and rhythm, S1, S2 normal, no murmur, click, rub or gallop, distant heart sounds. GI: soft, non-tender; bowel sounds normal; no masses,  no organomegaly Extremities: extremities normal, atraumatic, no cyanosis or edema and the foot is warm bilateral. Neuro: Alert oriented x3 without any focal neurologic deficit.   Lab Results: BMP  Recent Labs  12/03/13 0452 12/04/13 0409 12/05/13 1305  NA 142 139 134*  K 3.8 4.1 3.9  CL 108 103 102  CO2 20 18* 20  GLUCOSE 86 95 113*  BUN 23 23 23   CREATININE 1.11 1.05 1.14  CALCIUM 8.2* 8.5 8.3*  GFRNONAA 69* 74* 67*  GFRAA 80* 85* 77*    CBC  Recent Labs Lab 12/02/13 1115  WBC 9.8  RBC 3.86*  HGB 10.9*  HCT 31.5*  PLT 137*  MCV 81.6  MCH 28.2  MCHC 34.6  RDW 14.7    HEMOGLOBIN A1C No results found for this basename: HGBA1C,  MPG    Cardiac Panel (last 3 results)  Recent Labs  11/29/13 0900  11/29/13 1500 11/29/13 2145  TROPONINI >20.00* >20.00* >20.00*    BNP (last 3 results) No results found for this basename: PROBNP,  in the last 8760 hours  TSH  Recent Labs  11/27/13 0400  TSH 0.961    CHOLESTEROL  Recent Labs  11/30/13 0435  CHOL 137   Lipid Panel     Component Value Date/Time   CHOL 137 11/30/2013 0435   TRIG 167* 11/30/2013 0435   HDL 30* 11/30/2013 0435   CHOLHDL 4.6 11/30/2013 0435   VLDL 33 11/30/2013 0435   LDLCALC 74 11/30/2013 0435     Hepatic Function Panel  Recent Labs  11/28/13 0400 11/29/13 0440 11/30/13 0435 12/02/13 0710 12/05/13 1305  PROT 4.7* 5.0* 5.2* 6.0 5.5*  ALBUMIN 2.0* 2.0* 2.0* 2.4* 2.3*  AST 2068* 595* 301* 80* 30  ALT 1165* 990* 829* 356* 109*  ALKPHOS 123* 112 112 139* 123*  BILITOT 0.4 0.3 0.4 0.5 0.3  BILIDIR  --  <0.2  --   --   --   IBILI  --  NOT CALCULATED  --   --   --     Cardiac Studies: EKG 11/28/2013: Sinus tachycardia at the rate of 104 bpm, rightward axis, left posterior fascicular block, right bundle branch block.  Nonspecific T abnormality.  Low voltage complexes. EKG 11/29/2013: Normal sinus rhythm, nonspecific T abnormality.  Echocardiogram 12/05/2013: moderate to severe decrease in left frontal systolic function, ejection fraction 30-35%.  Akinesis of the entire anterior and anteroseptal wall.  Hyperdynamic inferior wall.  No significant valvular abnormality.  Compared to the echocardiogram done previously a week ago, EF improved from 20-25% to the present 30-35%. Tele: NSR. No VT  Assessment/Plan:  1.  Cardiogenic shock secondary to multilevel vessel critical coronary artery disease. 2.  CAD of the native vessels, successful PTCA and stenting of the left main, proximal LAD with implantation of a 4.0 x 18 mm Xience alpine stent.  3.  Multiorgan failure, acute renal failure, shock liver due to cardiac shock.  Resolved. 4. Hypertension.  5.  Deconditioning due to multiple complex medical condition.   Needs inpatient rehabilitation. 6. Anemia and hypoproteinemia, failure to thrive an adult due to acute illness   Recommendation: And is tolerating all the medications without any side effects. He continues to have mild itching no rash. I have discussed with his daughter that he should not return to any strenuous activities until I see him back in the office in 2 weeks.  I will try to call [facility], and a peel his denial for inpatient rehabilitation. If this does not feasible, patient is also looking at [facility]. If he indeed goes home, we'll arrange for outpatient physical therapy and once he is stable, will reference cardiac rehabilitation in the outpatient basis.  Discussed with case manager, have come up with a plan for disposition. From cardiac standpoint is stable to be discharged today pending his placement. With regard to life vest, no special arrangements need to be done.  [name], M.D. 12/06/2013, 8:29 AM [facility] Pager: [phone] Office: [phone] If no answer: [phone]

## 2013-12-06 NOTE — Progress Notes (Signed)
CARDIAC REHAB PHASE I   PRE:  Rate/Rhythm: 84 SR  BP:  Sitting: 100/60      SaO2: 93 RA  MODE:  Ambulation: 150 ft   POST:  Rate/Rhythm: 96 SR  BP:  Sitting: 100/58     SaO2: 98 RA 1005-1043 Pt ambulated x 1 assist with RW and gait belt. Gait much more steady than previous day. Pt c/o feeling tired and believed he could not make it another 150 ft. Post walk, pt to chair with call bell and phone in reach. Spoke with pt daughter Janett Billow on the phone, and agreed to do education with daughter present. Daughter states pt doesn't read and also prefers that she too hears all the education. Will check back with pt when daughter arrives.  Hafsah Hendler English PayneRN, BSN 12/06/2013 11:10 AM

## 2013-12-06 NOTE — Progress Notes (Signed)
Per CIR, the MD is doing a peer to peer with [employer] for patient to possibly go to CIR. CSW is continuing to follow in the event that [employer] denies CIR.   Jeanette Caprice, MSW, [place]

## 2013-12-07 ENCOUNTER — Inpatient Hospital Stay (HOSPITAL_COMMUNITY)
Admission: RE | Admit: 2013-12-07 | Discharge: 2013-12-11 | DRG: 945 | Disposition: A | Discharge status: Home / Self Care | Payer: BC Managed Care – PPO | Source: Intra-hospital | Attending: Physical Medicine & Rehabilitation | Admitting: Physical Medicine & Rehabilitation

## 2013-12-07 ENCOUNTER — Encounter: Payer: Self-pay | Admitting: Hematology & Oncology

## 2013-12-07 DIAGNOSIS — G931 Anoxic brain damage, not elsewhere classified: Secondary | ICD-10-CM | POA: Diagnosis present

## 2013-12-07 DIAGNOSIS — R5381 Other malaise: Secondary | ICD-10-CM | POA: Diagnosis present

## 2013-12-07 DIAGNOSIS — G9340 Encephalopathy, unspecified: Secondary | ICD-10-CM | POA: Diagnosis present

## 2013-12-07 DIAGNOSIS — Z79899 Other long term (current) drug therapy: Secondary | ICD-10-CM

## 2013-12-07 DIAGNOSIS — R059 Cough, unspecified: Secondary | ICD-10-CM | POA: Diagnosis present

## 2013-12-07 DIAGNOSIS — N179 Acute kidney failure, unspecified: Secondary | ICD-10-CM | POA: Diagnosis present

## 2013-12-07 DIAGNOSIS — I2510 Atherosclerotic heart disease of native coronary artery without angina pectoris: Secondary | ICD-10-CM

## 2013-12-07 DIAGNOSIS — G40909 Epilepsy, unspecified, not intractable, without status epilepticus: Secondary | ICD-10-CM | POA: Diagnosis present

## 2013-12-07 DIAGNOSIS — R413 Other amnesia: Secondary | ICD-10-CM | POA: Diagnosis present

## 2013-12-07 DIAGNOSIS — R0789 Other chest pain: Secondary | ICD-10-CM | POA: Diagnosis present

## 2013-12-07 DIAGNOSIS — Z7982 Long term (current) use of aspirin: Secondary | ICD-10-CM

## 2013-12-07 DIAGNOSIS — Z9861 Coronary angioplasty status: Secondary | ICD-10-CM

## 2013-12-07 DIAGNOSIS — I10 Essential (primary) hypertension: Secondary | ICD-10-CM | POA: Diagnosis present

## 2013-12-07 DIAGNOSIS — R579 Shock, unspecified: Secondary | ICD-10-CM

## 2013-12-07 DIAGNOSIS — R05 Cough: Secondary | ICD-10-CM | POA: Diagnosis present

## 2013-12-07 DIAGNOSIS — F17200 Nicotine dependence, unspecified, uncomplicated: Secondary | ICD-10-CM | POA: Diagnosis present

## 2013-12-07 DIAGNOSIS — Z8674 Personal history of sudden cardiac arrest: Secondary | ICD-10-CM

## 2013-12-07 DIAGNOSIS — Z5189 Encounter for other specified aftercare: Principal | ICD-10-CM

## 2013-12-07 DIAGNOSIS — J9600 Acute respiratory failure, unspecified whether with hypoxia or hypercapnia: Secondary | ICD-10-CM

## 2013-12-07 MED ORDER — PANTOPRAZOLE SODIUM 40 MG PO TBEC
40.0000 mg | DELAYED_RELEASE_TABLET | Freq: Every day | ORAL | Status: DC
  Administered 2013-12-08 – 2013-12-11 (×4): 40 mg via ORAL
  Filled 2013-12-07 (×4): qty 1

## 2013-12-07 MED ORDER — CARVEDILOL 12.5 MG PO TABS
12.5000 mg | ORAL_TABLET | Freq: Two times a day (BID) | ORAL | Status: DC
  Administered 2013-12-07 – 2013-12-11 (×8): 12.5 mg via ORAL
  Filled 2013-12-07 (×10): qty 1

## 2013-12-07 MED ORDER — ENSURE COMPLETE PO LIQD
237.0000 mL | Freq: Two times a day (BID) | ORAL | Status: DC
Start: 2013-12-07 — End: 2013-12-07

## 2013-12-07 MED ORDER — ONDANSETRON HCL 4 MG/2ML IJ SOLN: 4.0000 mg | Freq: Four times a day (QID) | INTRAMUSCULAR | Status: DC | PRN

## 2013-12-07 MED ORDER — SORBITOL 70 % SOLN: 30.0000 mL | Freq: Every day | Status: DC | PRN

## 2013-12-07 MED ORDER — PHENYTOIN SODIUM EXTENDED 100 MG PO CAPS
300.0000 mg | ORAL_CAPSULE | Freq: Every day | ORAL | Status: DC
  Administered 2013-12-07 – 2013-12-10 (×4): 300 mg via ORAL
  Filled 2013-12-07 (×5): qty 3

## 2013-12-07 MED ORDER — ATORVASTATIN CALCIUM 10 MG PO TABS
10.0000 mg | ORAL_TABLET | Freq: Every day | ORAL | Status: DC
  Administered 2013-12-07 – 2013-12-10 (×4): 10 mg via ORAL
  Filled 2013-12-07 (×5): qty 1

## 2013-12-07 MED ORDER — ONDANSETRON HCL 4 MG PO TABS: 4.0000 mg | ORAL_TABLET | Freq: Four times a day (QID) | ORAL | Status: DC | PRN

## 2013-12-07 MED ORDER — ACETAMINOPHEN 325 MG PO TABS
325.0000 mg | ORAL_TABLET | ORAL | Status: DC | PRN
  Administered 2013-12-08 – 2013-12-11 (×2): 650 mg via ORAL
  Filled 2013-12-07 (×2): qty 2

## 2013-12-07 MED ORDER — NEPRO/CARBSTEADY PO LIQD
237.0000 mL | Freq: Two times a day (BID) | ORAL | Status: DC
  Administered 2013-12-07 – 2013-12-11 (×7): 237 mL via ORAL

## 2013-12-07 MED ORDER — LISINOPRIL 5 MG PO TABS
5.0000 mg | ORAL_TABLET | Freq: Every day | ORAL | Status: DC
  Administered 2013-12-08 – 2013-12-11 (×4): 5 mg via ORAL
  Filled 2013-12-07 (×5): qty 1

## 2013-12-07 MED ORDER — ASPIRIN 81 MG PO CHEW
81.0000 mg | CHEWABLE_TABLET | Freq: Every day | ORAL | Status: DC
Start: 2013-12-08 — End: 2013-12-11
  Administered 2013-12-08 – 2013-12-11 (×4): 81 mg via ORAL
  Filled 2013-12-07 (×3): qty 1

## 2013-12-07 MED ORDER — BIOTENE DRY MOUTH MT LIQD
15.0000 mL | Freq: Two times a day (BID) | OROMUCOSAL | Status: DC
  Administered 2013-12-07 – 2013-12-11 (×7): 15 mL via OROMUCOSAL

## 2013-12-07 MED ORDER — TICAGRELOR 90 MG PO TABS
90.0000 mg | ORAL_TABLET | Freq: Two times a day (BID) | ORAL | Status: DC
  Administered 2013-12-07 – 2013-12-10 (×6): 90 mg via ORAL
  Filled 2013-12-07 (×8): qty 1

## 2013-12-07 MED ORDER — LIDOCAINE 5 % EX PTCH
1.0000 | MEDICATED_PATCH | CUTANEOUS | Status: DC
  Administered 2013-12-07 – 2013-12-10 (×4): 1 via TRANSDERMAL
  Filled 2013-12-07 (×5): qty 1

## 2013-12-07 NOTE — Progress Notes (Signed)
INITIAL NUTRITION ASSESSMENT  DOCUMENTATION CODES Per approved criteria  -Not Applicable   INTERVENTION: 1.  Supplements; Nepro Shake po BID, each supplement provides 425 kcal and 19 grams protein   NUTRITION DIAGNOSIS: Inadequate oral intake related to cognition, poor appetite  as evidenced by PO </=50% of meals.   Monitor:  1.  Food/Beverage; pt meeting >/=90% estimated needs with tolerance. 2.  Wt/wt change; monitor trends  Reason for Assessment: MST  63 y.o. male  Admitting Dx: deconditioning  ASSESSMENT: Pt with h/o seizures, HTN, recent nephrectomy for CA admitted with V.fib requiring intubation. Now admitted to rehab. RD met with pt who reports that he "eats like a bird, not even enough to feed a bird at home."  Pt reports he is eating per his usual.  PO intake has been 10-100% of meals.  Pt asks for a snack of graham crackers during visit.  No diet orders in chart.  Per RN, pt on Heart Healthy diet.  Snack provided.  Pt reports he drinks Butter Pecan Nepro at home per recommendation of his MD.   Nutrition Focused Physical Exam: Subcutaneous Fat:  Orbital Region: WNL Upper Arm Region: WNL Thoracic and Lumbar Region: WNL  Muscle:  Temple Region: WNL Clavicle Bone Region: WNL Clavicle and Acromion Bone Region: WNL Scapular Bone Region: WNL Dorsal Hand: WNL Patellar Region: WNL Anterior Thigh Region: WNL Posterior Calf Region: WNL  Edema: none present  Height: Ht Readings from Last 1 Encounters:  11/27/13 _0  (1.727 m)    Weight: Wt Readings from Last 1 Encounters:  12/01/13 199 lb 15.3 oz (90.7 kg)    Ideal Body Weight: 70kg  % Ideal Body Weight: 129%  Wt Readings from Last 10 Encounters:  12/01/13 199 lb 15.3 oz (90.7 kg)  12/01/13 199 lb 15.3 oz (90.7 kg)  11/20/13 176 lb (79.833 kg)  11/04/13 174 lb (78.926 kg)  10/20/13 178 lb 2.1 oz (80.8 kg)  10/20/13 178 lb 2.1 oz (80.8 kg)  10/15/13 179 lb (81.194 kg)  10/10/13 179 lb 12.8 oz (81.557  kg)  10/08/13 179 lb (81.194 kg)  09/25/13 177 lb (80.287 kg)    Usual Body Weight: 175-180 lbs  % Usual Body Weight: 104%  BMI:  There is no weight on file to calculate BMI.  Estimated Nutritional Needs: Kcal: 1830-2000 Protein: 80-90g Fluid: ~2.0 L/day  Skin: generalized edema  Diet Order:    EDUCATION NEEDS: -Education needs addressed  No intake or output data in the 24 hours ending 12/07/13 1451  Last BM: 5/8  Labs:   Recent Labs Lab 11/30/13 1600  12/01/13 0415 12/01/13 1230  12/03/13 0452 12/04/13 0409 12/05/13 1305  NA  --   --  142  --   < > 142 139 134*  K  --   < > 2.9* 3.3*  < > 3.8 4.1 3.9  CL  --   --  106  --   < > 108 103 102  CO2  --   --  25  --   < > 20 18* 20  BUN  --   --  29*  --   < > _1 CREATININE  --   --  1.45*  --   < > 1.11 1.05 1.14  CALCIUM  --   --  8.0*  --   < > 8.2* 8.5 8.3*  MG  --   --  2.0 1.8  --   --   --   --  PHOS 6.5*  --  2.7  --   --   --   --   --   GLUCOSE  --   --  89  --   < > 86 95 113*  < > = values in this interval not displayed.  CBG (last 3)  No results found for this basename: GLUCAP,  in the last 72 hours  Scheduled Meds:  Continuous Infusions:  Past Medical History  Diagnosis Date  . Gout   . GERD (gastroesophageal reflux disease)   . Hypertension   . Hypercholesteremia   . Cancer     right kidney cancer  . Shortness of breath     with activity  . Epilepsy     at least 10 years ago   . Family history of anesthesia complication     mother-had stroke during anesthesia  . HTN (hypertension)   . Seizures   . Cardiac arrest   . Cancer     Past Surgical History  Procedure Laterality Date  . No past surgeries    . Eye surgery Left 2 weeks ago    torn retina  . Robot assisted laparoscopic nephrectomy Right 10/19/2013    Procedure: ROBOTIC ASSISTED LAPAROSCOPIC NEPHRECTOMY,  EXTENSIVE ADHESIOLYSIS;  Surgeon: Alexis Frock, MD;  Location: [facility];  Service: Urology;  Laterality:  Right;    Brynda Greathouse, MS RD LDN Clinical Inpatient Dietitian Pager: [phone] Weekend/After hours pager: [phone]

## 2013-12-07 NOTE — Progress Notes (Signed)
Physical Medicine and Rehabilitation Consult   Reason for Consult: Cardiac arrest   Referring Physician:  Dr. Einar Gip     HPI: Gilbert [name] is a 63 y.o. male with history of seizures, HTN, recent nephrectomy for CA, who was found unresponsive in his chair on 11/27/13--downtime unknown. When the EMS came after activation from the family members, patient was found to be in V. fib/VF, was defibrillated, eventually on presentation to the emergency room, intubated and resuscitated. hypothermia protocol initiated and 2D echo with EF 25-30% with suggestion of sever LAD territory ischemia or possibly Takatsubo CM. Cardiogenic shock treated with pressors and aspiration PNA with pneumonitis treated with IV antibiotics.  He underwent cardiac cath with PTCA and stenting of left main and proximal LAD and placement of Impella CP device (to help with cardiac output)  by Dr. Einar Gip. Patient with LLE coolness post Impella removal and ABI with mild to moderate plaque in L-FA with moderate to severe reduction in arterial flow. Extubated on 11/30/13 and therapies initiated yesterday. CIR recommended by rehab team due to deconditioned state.    Pt did not recognize pastor for almost a minute after he visited room Pt aware of hospital , [facility], but not aware of cardiac arrest     Review of Systems  HENT: Negative for hearing loss.   Eyes: Negative for blurred vision and double vision.  Respiratory: Positive for cough.   Cardiovascular: Positive for chest pain (chest wall pain).  Gastrointestinal: Negative for heartburn and abdominal pain.  Musculoskeletal: Positive for myalgias.  Neurological: Negative for headaches.  Psychiatric/Behavioral: Positive for memory loss.     Past Medical History   Diagnosis  Date   .  HTN (hypertension)     .  Seizures     .  Cardiac arrest     .  Cancer        History reviewed. No pertinent past surgical history.     No family history on file.   Social History:  Lives  with daughter. Daughter works days. Retired Games developer.  Independent without AD. He reports he does not smoke. His smokeless tobacco use includes Chew. His alcohol and drug histories are not on file.      Allergies   Allergen  Reactions   .  Oxycodone  Swelling       Tongue and lip    Medications Prior to Admission   Medication  Sig  Dispense  Refill   .  aspirin EC 81 MG tablet  Take 81 mg by mouth daily.         Marland Kitchen  docusate sodium (COLACE) 100 MG capsule  Take 100 mg by mouth 2 (two) times daily.         .  lansoprazole (PREVACID) 30 MG capsule  Take 30 mg by mouth daily.         Marland Kitchen  lisinopril (PRINIVIL,ZESTRIL) 20 MG tablet  Take 20 mg by mouth at bedtime.         .  metoprolol tartrate (LOPRESSOR) 25 MG tablet  Take 25 mg by mouth 2 (two) times daily.         .  phenytoin (DILANTIN) 100 MG ER capsule  Take 300 mg by mouth daily.         .  pravastatin (PRAVACHOL) 40 MG tablet  Take 40 mg by mouth daily.         .  probenecid (BENEMID) 500 MG tablet  Take 500 mg by mouth daily.  Home: Home Living Family/patient expects to be discharged to:: Private residence Living Arrangements: Children Available Help at Discharge: Family;Available 24 hours/day (Available assist from daughters will need to be clarified) Type of Home: House Home Access: Stairs to enter [employer] of Steps: 3 Entrance Stairs-Rails: None Home Layout: One level Home Equipment: Other (comment) (to be determined)   Functional History: Prior Function Level of Independence: Independent Functional Status:   Mobility: Bed Mobility Overal bed mobility: Needs Assistance;+ 2 for safety/equipment Bed Mobility: Supine to Sit Supine to sit: Mod assist;+2 for safety/equipment General bed mobility comments: Cues for initiation and technique; Assisted LEs to EOB at pt's request; Physical assist to acheive upright sitting -- support given posteriorly with assist at shoulders, and assist in front given  at pelvic girdle to facilitate getting to upright sitting with equal weight bilaterally; Assist foot placement for upright sitting Transfers General transfer comment: Intiated session with goal/intention of assessing sit<>stand, however pt with decr sitting balance and decr activity tolerance, and we therefore chose to lay back down and position pt comfortably in bed   ADL:   Cognition: Cognition Overall Cognitive Status: Within Functional Limits for tasks assessed Orientation Level: Oriented X4 Cognition Arousal/Alertness: Lethargic;Suspect due to medications (had taken Benadryl per RN; not restful sleep last night) Behavior During Therapy: Flat affect;Restless Overall Cognitive Status: Within Functional Limits for tasks assessed   Blood pressure 152/83, pulse 92, temperature 97.9 F (36.6 C), temperature source Oral, resp. rate 26, height 5\' 8"  (1.727 m), weight 90.7 kg (199 lb 15.3 oz), SpO2 94.00%. Physical Exam  Nursing note and vitals reviewed. Constitutional: He is oriented to person, place, and time. He appears well-developed and well-nourished.  HENT:   Head: Normocephalic and atraumatic.  Eyes: Conjunctivae are normal.  Neck: Normal range of motion. Neck supple.  Cardiovascular: Normal rate and regular rhythm.    No murmur heard. Respiratory: He has rhonchi in the right middle field and the right lower field. He exhibits tenderness (with minimal activity in bed. ).  Weak cough.   GI: Soft. Bowel sounds are normal. He exhibits no distension.  Musculoskeletal: He exhibits no edema.  BLE warm to touch. No cyanosis. No pain with ROM.   Neurological: He is alert and oriented to person, place, and time.  Speech clear. Some problems with recall of recent incidents. Follows basic commands without difficulty. Mildly impulsive with impaired insight.   Skin: Skin is warm and dry.  motor strength is 4/5 bilateral deltoid, bicep, tricep, grip, hip flexors, knee extensors, ankle  dorsiflexion plantar flexor Sensation intact to light touch in bilateral upper and lower limbs. Pain with cough    Results for orders placed during the hospital encounter of 11/26/13 (from the past 24 hour(s))   CBC     Status: Abnormal     Collection Time      12/02/13 11:15 AM       Result  Value  Ref Range     WBC  9.8   4.0 - 10.5 K/uL     RBC  3.86 (*)  4.22 - 5.81 MIL/uL     Hemoglobin  10.9 (*)  13.0 - 17.0 g/dL     HCT  31.5 (*)  39.0 - 52.0 %     MCV  81.6   78.0 - 100.0 fL     MCH  28.2   26.0 - 34.0 pg     MCHC  34.6   30.0 - 36.0 g/dL  RDW  14.7   11.5 - 15.5 %     Platelets  137 (*)  150 - 400 K/uL   BASIC METABOLIC PANEL     Status: Abnormal     Collection Time      12/03/13  4:52 AM       Result  Value  Ref Range     Sodium  142   137 - 147 mEq/L     Potassium  3.8   3.7 - 5.3 mEq/L     Chloride  108   96 - 112 mEq/L     CO2  20   19 - 32 mEq/L     Glucose, Bld  86   70 - 99 mg/dL     BUN  23   6 - 23 mg/dL     Creatinine, Ser  1.11   0.50 - 1.35 mg/dL     Calcium  8.2 (*)  8.4 - 10.5 mg/dL     GFR calc non Af Amer  69 (*)  >90 mL/min     GFR calc Af Amer  80 (*)  >90 mL/min    Dg Chest Port 1 View   12/02/2013   CLINICAL DATA:  Respiratory failure.  EXAM: PORTABLE CHEST - 1 VIEW  COMPARISON:  DG CHEST 1V PORT dated 11/30/2013  FINDINGS: Interim removal of endotracheal tube, left IJ line, and NG tube. Stable cardiomegaly with mild pulmonary vascular prominence and diffuse bilateral interstitial prominence consistent with congestive heart failure pulmonary interstitial edema. Small pleural effusions are present.  IMPRESSION: 1. Interim removal of lines and tubes. 2. Congestive heart failure with mild pulmonary interstitial edema and small pleural effusions.   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 Does the need for close, 24 hr/day  medical supervision in concert with the patient's rehab needs make it unreasonable for this patient to be served in a less intensive setting? Potentially Co-Morbidities requiring supervision/potential complications: Cardiomyopathy, renal cell carcinoma status post nephrectomy approximately one month ago, recent aspiration pneumonia, chest contusion secondary to CPR Due to bladder management, bowel management, safety, skin/wound care, disease management, medication administration, pain management and patient education, does the patient require 24 hr/day rehab nursing? Potentially Does the patient require coordinated care of a physician, rehab nurse, PT (1-2 hrs/day, 5 days/week), OT (1-2 hrs/day, 5 days/week) and SLP (0.5-1 hrs/day, 5 days/week) to address physical and functional deficits in the context of the above medical diagnosis(es)? Yes and Potentially Addressing deficits in the following areas: balance, endurance, locomotion, strength, transferring, bowel/bladder control, bathing, dressing, feeding, grooming, toileting and cognition Can the patient actively participate in an intensive therapy program of at least 3 hrs of therapy per day at least 5 days per week? Potentially The potential for patient to make measurable gains while on inpatient rehab is fair Anticipated functional outcomes upon discharge from inpatient rehab are n/a  with PT, n/a with OT, n/a with SLP. Estimated rehab length of stay to reach the above functional goals is: NA Does the patient have adequate social supports to accommodate these discharge functional goals? Potentially Anticipated D/C setting: Home Anticipated post D/C treatments: HH therapy Overall Rehab/Functional Prognosis: good   RECOMMENDATIONS: This patient's condition is appropriate for continued rehabilitative care in the following setting: Not able to tolerate CIR at the current time, once therapy is tolerated on acute unit then recommend CIR Patient has  agreed to participate in recommended program. Potentially Note that insurance prior authorization [employer] be required for reimbursement for recommended care.   Comment: Rehabilitation admission coordinator to follow       12/03/2013    Revision History...     Date/Time User Action   12/03/2013 10:27 AM Charlett Blake, MD Sign   12/03/2013 9:46 AM Bary Leriche, PA-C Share  View Details Report   Routing History...     Date/Time From To Method   12/03/2013 10:27 AM Charlett Blake, MD Charlett Blake, MD In Basket   12/03/2013 10:27 AM Charlett Blake, MD [name], MD In Basket

## 2013-12-07 NOTE — Progress Notes (Signed)
Pt was transferred to [id number]; report called to RN.  Carollee Sires, RN

## 2013-12-07 NOTE — PMR Pre-admission (Signed)
PMR Admission Coordinator Pre-Admission Assessment  Patient: [name] [name] is an 63 y.o., male MRN: [id number] DOB: October 28, 1950 Height: 5\' 8"  (172.7 cm) Weight: 90.7 kg (199 lb 15.3 oz)              Insurance Information HMO:     PPO: Yes     PCP:      IPA:      80/20:      OTHER:  PRIMARY: BCBS [place] PPO      Policy#: [id number]      Subscriber: self CM Name: [name] [name]       Phone#: [phone]     Fax#: [phone] Updates due to [name] Sidle, RN on 12-13-13 to above fax  Pre-Cert#: [id number]      Employer: just returned to work at [employer]:  Phone #: [phone]     Name: [name] [name]. Date: 08-02-13     Deduct: $500 (met all)      Out of Pocket Max: $700 (met all)      Life Max: none CIR: 100%      SNF: 100% (60 days max)  Outpatient: 100%     Co-Pay: no copay, 30 visit max Home Health: 100%      Co-Pay: based on medical necessity DME: 100%     Co-Pay: none Providers: in network  Emergency Contact Information Contact Information   Name Relation Home Work Mobile   Gilbert [name], Gilbert [name] [place]) Daughter  [phone]   [name] [name],[name] [name] Daughter [phone]       Current Medical History  Patient Admitting Diagnosis: Deconditioning following cardiac arrest with hypoxic encephalopathy  History of Present Illness: [name] [name] is a 63 y.o. male with history of seizures, HTN, recent nephrectomy for CA, who was found unresponsive in his chair on 11/27/13--downtime unknown. When the EMS came after activation from the family members, patient was found to be in V. fib/VF, was defibrillated, eventually on presentation to the emergency room, intubated and resuscitated. hypothermia protocol initiated and 2D echo with EF 25-30% with suggestion of sever LAD territory ischemia or possibly [name] [name] CM. Cardiogenic shock treated with pressors and aspiration PNA with pneumonitis treated with IV antibiotics.  He underwent cardiac cath with PTCA and stenting of left main and proximal LAD and  placement of Impella CP device (to help with cardiac output)  by Dr. Einar [name]. Patient with LLE coolness post Impella removal and Gilbert [name] with mild to moderate plaque in L-FA with moderate to severe reduction in arterial flow. Extubated on 11/30/13 and therapies initiated yesterday. CIR recommended by rehab team due to deconditioned state.    Past Medical History  Past Medical History  Diagnosis Date  . HTN (hypertension)   . Seizures   . Cardiac arrest   . Cancer     Family History  family history is not on file.  Prior Rehab/Hospitalizations: had previous treatment for Kidney CA in fall of 2014 but no follow up therapy   Current Medications  Current facility-administered medications:0.9 %  sodium chloride infusion, , Intravenous, Continuous, [name] Rice, MD;  acetaminophen (TYLENOL) tablet 1,000 mg, 1,000 mg, Oral, Q6H PRN, [name] Mcardle, MD, 1,000 mg at 12/02/13 2148;  antiseptic oral rinse (BIOTENE) solution 15 mL, 15 mL, Mouth Rinse, BID, [name] Mcardle, MD, 15 mL at 12/07/13 0800;  aspirin chewable tablet 81 mg, 81 mg, Oral, Daily, Brand Males, MD, 81 mg at 12/07/13 0959 atorvastatin (LIPITOR) tablet 10 mg, 10 mg, Oral, q1800, [name] Page, MD, 10 mg at 12/06/13 1811;  carvedilol (COREG) tablet 12.5 mg, 12.5 mg, Oral, BID WC, [name] Pert, MD, 12.5 mg at 12/07/13 4986;  feeding supplement (ENSURE COMPLETE) (ENSURE COMPLETE) liquid 237 mL, 237 mL, Oral, BID BM, [name] [name], RD, 237 mL at 12/07/13 1000 lisinopril (PRINIVIL,ZESTRIL) tablet 5 mg, 5 mg, Oral, Daily, [name] Pert, MD, 5 mg at 12/07/13 1000;  pantoprazole (PROTONIX) EC tablet 40 mg, 40 mg, Oral, Q1200, Gilbert Katos, MD, 40 mg at 12/07/13 9229;  phenytoin (DILANTIN) ER capsule 300 mg, 300 mg, Oral, QHS, [name] [name], RPH, 300 mg at 12/06/13 2106;  ticagrelor (BRILINTA) tablet 90 mg, 90 mg, Oral, BID, [name] Pert, MD, 90 mg at 12/07/13 4082  Patients Current Diet:  heart healthy, carb  modified  Precautions / Restrictions Precautions - with Lifevest  Precautions: Fall Precaution Comments: significant Chest pain with activity, likely due to chest compressions during CPR Restrictions Weight Bearing Restrictions: No   Prior Activity Level Community (5-7x/wk): 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 Pt enjoys golfing with his dtr and would ride the course.    Home Assistive Devices / Equipment Home Assistive Devices/Equipment: None Home Equipment: Other (comment) (to be determined)  Prior Functional Level Prior Function Level of Independence: Independent  Current Functional Level Cognition  Overall Cognitive Status: No family/caregiver present to determine baseline cognitive functioning Orientation Level: Oriented X4 Safety/Judgement: Decreased awareness of deficits    Extremity Assessment (includes Sensation/Coordination)          ADLs  Overall ADL's : Needs assistance/impaired Grooming: Wash/dry hands;Min guard;Standing (using UE support on counter) Grooming Details (indicate cue type and reason): Pt requires LB against cabinet support for reaching outside base of support Lower Body Dressing: Min guard;Sitting/lateral leans Lower Body Dressing Details (indicate cue type and reason): able to cross BIL LE to reach socks Toilet Transfer: Moderate assistance (bil UE used) Functional mobility during ADLs: Minimal assistance;Rolling walker General ADL Comments: Pt required x2 (A) to control descend to chair with incr independence. pt with fast uncontrolled descend. Pt ambulating unit with scissoring gait and narrowed base of support. Pt stopping to rest x2 during ~100 ft . Pt reports pain in left foot as "it got stuck a moment"    Mobility  Overal bed mobility: Needs Assistance;+ 2 for safety/equipment Bed Mobility: Supine to Sit Supine to sit:  Min assist;+2 for physical assistance General bed mobility comments: in chair on arrival    Transfers  Overall transfer level: Needs assistance Equipment used: Rolling walker (2 wheeled) Transfers: Sit to/from Stand Sit to Stand: Mod assist General transfer comment: requires use of BIL UE and scoot to edge of chair to power up into standing    Ambulation / Gait / Stairs / Wheelchair Mobility  Ambulation/Gait Ambulation/Gait assistance: Architect (Feet): 150 Feet Assistive device: Rolling walker (2 wheeled) Gait Pattern/deviations: Step-through pattern;Decreased stride length;Trunk flexed Gait velocity: Decreased Gait velocity interpretation: Below normal speed for age/gender General Gait Details: Verbal cues to stand more erect and to stay proper distance away from walker. Bil knee hyperextension in stance.    Posture / Balance Dynamic Sitting Balance Sitting balance - Comments: Initially not needing support with sitting EOB, however noted L eft lean which pt did not self-correct, and he needed phsyical assist to regain upright sitting; Attempted to acquire sitting BP as pt reported dizziness sitting  EOB, until pt finally stated, "Woman, I'm dying" -- at which point we assisted pt back to supine in bed    Special needs/care consideration BiPAP/CPAP no  CPM no  Continuous Drip IV no  Dialysis no          Life Vest - yes Oxygen no  Special Bed no  Trach Size no  Wound Vac (area) no        Skin no                               Bowel mgmt: last BM on 12-07-13, loose Bladder mgmt: using urinal Diabetic mgmt no   Previous Home Environment Living Arrangements: Children (lives with dtr Janett Billow and 2 grandchildren) Available Help at Discharge: Family; Dtr works a fluctuating schedule but is at home most of time. Type of Home: House Home Layout: One level Home Access: Stairs to enter Entrance Stairs-Rails: None Entrance Stairs-Number of Steps: 3 Home Care  Services: No  Discharge Living Setting Plans for Discharge Living Setting: Lives with (comment) (lives with dtr Janett Billow) Type of Home at Discharge: House Discharge Home Layout: One level Discharge Home Access: Stairs to enter Entrance Stairs-Rails: None Entrance Stairs-Number of Steps: 3-4 Does the patient have any problems obtaining your medications?: No  Social/Family/Support Systems Patient Roles:  (role as Grandpa as he lives with dtr and 2 grandsons (ages 60 and 50) Contact Information: dtr Estill Bamberg is primary contact Anticipated Caregiver: dtr Janett Billow Anticipated Caregiver's Contact Information: see above Ability/Limitations of Caregiver: dtr Janett Billow does have a fluctuating work schedule and is there majority of time, but not 24-7 (pt does have 5 siblings that [place] offer support as well.Aunt Vaughan Basta or Katharine Look could also possibly help) Caregiver Availability: Intermittent (depends on dtr's work schedule) Discharge Plan Discussed with Primary Caregiver: Yes Is Caregiver In Agreement with Plan?: Yes Does Caregiver/Family have Issues with Lodging/Transportation while Pt is in Rehab?: No  Goals/Additional Needs Patient/Family Goal for Rehab: supervision with PT, Mod I with OT and SLP Expected length of stay: 7-10 days Cultural Considerations: none Dietary Needs: heart healthy, carb modified Equipment Needs: to be determined Pt/Family Agrees to Admission and willing to participate: Yes (spoke with dtr by phone on 5-5 and 5-6) Program Orientation Provided & Reviewed with Pt/Caregiver Including Roles  & Responsibilities: Yes   Decrease burden of Care through IP rehab admission:  NA   Possible need for SNF placement upon discharge: not anticipated   Patient Condition: This patient's medical and functional status has changed since the consult dated: 12-03-13 in which the Rehabilitation Physician determined and documented that the patient's condition is appropriate for intensive  rehabilitative care in an inpatient rehabilitation facility. See "History of Present Illness" (above) for medical update. Functional changes are: minimal assistance with gait and moderate assist with commode transfer. Patient's medical and functional status update has been discussed with the Rehabilitation physician and patient remains appropriate for inpatient rehabilitation. Will admit to inpatient rehab today.  Preadmission Screen Completed By:  Ave Filter, PT 12/07/2013 11:16 AM ______________________________________________________________________   Discussed status with Dr. Naaman Plummer on 12-07-13 at 1121 and received telephone approval for admission today.  Admission Coordinator:  Ave Filter, PT time 1121/Date 12-07-13

## 2013-12-07 NOTE — Progress Notes (Signed)
PMR Admission Coordinator Pre-Admission Assessment  Patient: Gilbert [name] is an 63 y.o., male  MRN: [id number]  DOB: [birth year]  Height: 5' 8" (172.7 cm)  Weight: 90.7 kg (199 lb 15.3 oz)  Insurance Information  HMO: PPO: Yes PCP: IPA: 80/20: OTHER:  PRIMARY: BCBS [place] PPO Policy#: [id number] Subscriber: self  CM Name: Marcie Bal Phone#: [phone] Fax#: [phone]  Updates due to Jonelle Sidle, RN on 12-13-13 to above fax  Pre-Cert#: [id number] Employer: just returned to work at [employer]: Phone #: [phone] Name: Doran Clay. Date: 08-02-13 Deduct: $500 (met all) Out of Pocket Max: $700 (met all) Life Max: none  CIR: 100% SNF: 100% (60 days max)  Outpatient: 100% Co-Pay: no copay, 30 visit max  Home Health: 100% Co-Pay: based on medical necessity  DME: 100% Co-Pay: none  Providers: in network  Emergency Contact Information    Contact Information     Name  Relation  Home  Work  Mobile     Khye, [place])  Daughter   [phone]  [phone]     Finfrock,[name]  Daughter  [phone]          Current Medical History  Patient Admitting Diagnosis: Deconditioning following cardiac arrest with hypoxic encephalopathy  History of Present Illness: Foster Kliebert is a 63 y.o. male with history of seizures, HTN, recent nephrectomy for CA, who was found unresponsive in his chair on 11/27/13--downtime unknown. When the EMS came after activation from the family members, patient was found to be in V. fib/VF, was defibrillated, eventually on presentation to the emergency room, intubated and resuscitated. hypothermia protocol initiated and 2D echo with EF 25-30% with suggestion of sever LAD territory ischemia or possibly Takatsubo CM. Cardiogenic shock treated with pressors and aspiration PNA with pneumonitis treated with IV antibiotics. He underwent cardiac cath with PTCA and stenting of left main and proximal LAD and placement of Impella CP device (to help with cardiac output) by Dr.  Einar Gip. Patient with LLE coolness post Impella removal and ABI with mild to moderate plaque in L-FA with moderate to severe reduction in arterial flow. Extubated on 11/30/13 and therapies initiated yesterday. CIR recommended by rehab team due to deconditioned state.  Past Medical History    Past Medical History    Diagnosis  Date    .  HTN (hypertension)     .  Seizures     .  Cardiac arrest     .  Cancer      Family History  family history is not on file.  Prior Rehab/Hospitalizations: had previous treatment for Kidney CA in fall of 2014 but no follow up therapy  Current Medications  Current facility-administered medications:0.9 % sodium chloride infusion, , Intravenous, Continuous, Julianne Rice, MD; acetaminophen (TYLENOL) tablet 1,000 mg, 1,000 mg, Oral, Q6H PRN, Wilhelmina Mcardle, MD, 1,000 mg at 12/02/13 2148; antiseptic oral rinse (BIOTENE) solution 15 mL, 15 mL, Mouth Rinse, BID, Wilhelmina Mcardle, MD, 15 mL at 12/07/13 0800; aspirin chewable tablet 81 mg, 81 mg, Oral, Daily, Brand Males, MD, 81 mg at 12/07/13 0959  atorvastatin (LIPITOR) tablet 10 mg, 10 mg, Oral, q1800, [name], MD, 10 mg at 12/06/13 1811; carvedilol (COREG) tablet 12.5 mg, 12.5 mg, Oral, BID WC, [name], MD, 12.5 mg at 12/07/13 8921; feeding supplement (ENSURE COMPLETE) (ENSURE COMPLETE) liquid 237 mL, 237 mL, Oral, BID BM, Elonda Husky, RD, 237 mL at 12/07/13 1000  lisinopril (PRINIVIL,ZESTRIL) tablet 5 mg, 5 mg, Oral, Daily, Turner Daniels  Einar Gip, MD, 5 mg at 12/07/13 1000; pantoprazole (PROTONIX) EC tablet 40 mg, 40 mg, Oral, Q1200, Wilhelmina Mcardle, MD, 40 mg at 12/07/13 9528; phenytoin (DILANTIN) ER capsule 300 mg, 300 mg, Oral, QHS, Georgina Peer, RPH, 300 mg at 12/06/13 2106; ticagrelor (BRILINTA) tablet 90 mg, 90 mg, Oral, BID, [name], MD, 90 mg at 12/07/13 4132  Patients Current Diet: heart healthy, carb modified  Precautions / Restrictions  Precautions - with Lifevest   Precautions: Fall  Precaution Comments: significant Chest pain with activity, likely due to chest compressions during CPR  Restrictions  Weight Bearing Restrictions: No  Prior Activity Level  Community (5-7x/wk): 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 Pt enjoys golfing with his dtr and would ride the course.  Home Assistive Devices / Equipment  Home Assistive Devices/Equipment: None  Home Equipment: Other (comment) (to be determined)  Prior Functional Level  Prior Function  Level of Independence: Independent  Current Functional Level    Cognition  Overall Cognitive Status: No family/caregiver present to determine baseline cognitive functioning  Orientation Level: Oriented X4  Safety/Judgement: Decreased awareness of deficits    Extremity Assessment  (includes Sensation/Coordination)      ADLs  Overall ADL's : Needs assistance/impaired  Grooming: Wash/dry hands;Min guard;Standing (using UE support on counter)  Grooming Details (indicate cue type and reason): Pt requires LB against cabinet support for reaching outside base of support  Lower Body Dressing: Min guard;Sitting/lateral leans  Lower Body Dressing Details (indicate cue type and reason): able to cross BIL LE to reach socks  Toilet Transfer: Moderate assistance (bil UE used)  Functional mobility during ADLs: Minimal assistance;Rolling walker  General ADL Comments: Pt required x2 (A) to control descend to chair with incr independence. pt with fast uncontrolled descend. Pt ambulating unit with scissoring gait and narrowed base of support. Pt stopping to rest x2 during ~100 ft . Pt reports pain in left foot as "it got stuck a moment"    Mobility  Overal bed mobility: Needs Assistance;+ 2 for safety/equipment  Bed Mobility: Supine to Sit  Supine to sit: Min assist;+2 for physical assistance  General bed mobility  comments: in chair on arrival    Transfers  Overall transfer level: Needs assistance  Equipment used: Rolling walker (2 wheeled)  Transfers: Sit to/from Stand  Sit to Stand: Mod assist  General transfer comment: requires use of BIL UE and scoot to edge of chair to power up into standing    Ambulation / Gait / Stairs / Wheelchair Mobility  Ambulation/Gait  Ambulation/Gait assistance: Fish farm manager (Feet): 150 Feet  Assistive device: Rolling walker (2 wheeled)  Gait Pattern/deviations: Step-through pattern;Decreased stride length;Trunk flexed  Gait velocity: Decreased  Gait velocity interpretation: Below normal speed for age/gender  General Gait Details: Verbal cues to stand more erect and to stay proper distance away from walker. Bil knee hyperextension in stance.    Posture / Balance  Dynamic Sitting Balance  Sitting balance - Comments: Initially not needing support with sitting EOB, however noted L eft lean which pt did not self-correct, and he needed phsyical assist to regain upright sitting; Attempted to acquire sitting BP as pt reported dizziness sitting EOB, until pt finally stated, "Woman, I'm dying" -- at which point we assisted pt back to supine in bed    Special needs/care consideration  BiPAP/CPAP no  CPM no  Continuous Drip IV no  Dialysis no  Life Vest - yes  Oxygen no  Special Bed no  Trach Size no  Wound Vac (area) no  Skin no  Bowel mgmt: last BM on 12-07-13, loose  Bladder mgmt: using urinal  Diabetic mgmt no    Previous Home Environment  Living Arrangements: Children (lives with dtr Janett Billow and 2 grandchildren)  Available Help at Discharge: Family; Dtr works a fluctuating schedule but is at home most of time.  Type of Home: House  Home Layout: One level  Home Access: Stairs to enter  Entrance Stairs-Rails: None  Entrance Stairs-Number of Steps: 3  Home Care Services: No  Discharge Living Setting  Plans for Discharge Living Setting: Lives with  (comment) (lives with dtr Janett Billow)  Type of Home at Discharge: House  Discharge Home Layout: One level  Discharge Home Access: Stairs to enter  Entrance Stairs-Rails: None  Entrance Stairs-Number of Steps: 3-4  Does the patient have any problems obtaining your medications?: No  Social/Family/Support Systems  Patient Roles: (role as Grandpa as he lives with dtr and 2 grandsons (ages 24 and 38)  Contact Information: dtr Estill Bamberg is primary contact  Anticipated Caregiver: dtr Janett Billow  Anticipated Caregiver's Contact Information: see above  Ability/Limitations of Caregiver: dtr Janett Billow does have a fluctuating work schedule and is there majority of time, but not 24-7 (pt does have 5 siblings that [place] offer support as well.Aunt Vaughan Basta or Katharine Look could also possibly help)  Caregiver Availability: Intermittent (depends on dtr's work schedule)  Discharge Plan Discussed with Primary Caregiver: Yes  Is Caregiver In Agreement with Plan?: Yes  Does Caregiver/Family have Issues with Lodging/Transportation while Pt is in Rehab?: No  Goals/Additional Needs  Patient/Family Goal for Rehab: supervision with PT, Mod I with OT and SLP  Expected length of stay: 7-10 days  Cultural Considerations: none  Dietary Needs: heart healthy, carb modified  Equipment Needs: to be determined  Pt/Family Agrees to Admission and willing to participate: Yes (spoke with dtr by phone on 5-5 and 5-6)  Program Orientation Provided & Reviewed with Pt/Caregiver Including Roles & Responsibilities: Yes  Decrease burden of Care through IP rehab admission: NA  Possible need for SNF placement upon discharge: not anticipated  Patient Condition: This patient's medical and functional status has changed since the consult dated: 12-03-13 in which the Rehabilitation Physician determined and documented that the patient's condition is appropriate for intensive rehabilitative care in an inpatient rehabilitation facility. See "History of Present Illness"  (above) for medical update. Functional changes are: minimal assistance with gait and moderate assist with commode transfer. Patient's medical and functional status update has been discussed with the Rehabilitation physician and patient remains appropriate for inpatient rehabilitation. Will admit to inpatient rehab today.  Preadmission Screen Completed By: Ave Filter, PT 12/07/2013 11:16 AM  ______________________________________________________________________  Discussed status with Dr. Naaman Plummer on 12-07-13 at 1121 and received telephone approval for admission today.  Admission Coordinator: Ave Filter, PT time 1121/Date 12-07-13    Cosigned by: Meredith Staggers, MD [12/07/2013 12:47 PM]

## 2013-12-07 NOTE — Progress Notes (Addendum)
Rehab admissions - I followed up with pt and his daughter Janett Billow to complete admission paperwork for inpatient rehab admission. They were pleased that we got insurance approval from [facility].Dr. Einar Gip is aware of plan for CIR today. I also called and spoke with pt's other daughter Estill Bamberg by phone to share update.  I updated pt's RN and Judson Roch, case manager and will admit pt to CIR later today. Please call me with any questions. Thanks.  Nanetta Batty, PT Rehabilitation Admissions Coordinator [phone]

## 2013-12-07 NOTE — Progress Notes (Signed)
Per CM, patient was approved for CIR. CSW will sign off at this time as there are no longer social work needs. Please re consult if social work needs arise.  Jeanette Caprice, MSW, [place]

## 2013-12-07 NOTE — H&P (Signed)
Physical Medicine and Rehabilitation Admission H&P    Chief complaint: Weakness   HPI: Gilbert [name] is a 63 y.o. male with history of seizures, HTN, recent nephrectomy for CA, who was found unresponsive in his chair on 11/27/13--downtime unknown. When the EMS came after activation from the family members, patient was found to be in V. fib/VF, was defibrillated, eventually on presentation to the emergency room, intubated and resuscitated. hypothermia protocol initiated and 2D echo with EF 25-30% with suggestion of sever LAD territory ischemia or possibly Takatsubo CM. Cardiogenic shock treated with pressors and aspiration PNA with pneumonitis treated with IV antibiotics. He underwent cardiac cath with PTCA and stenting of left main and proximal LAD and placement of Impella CP device (to help with cardiac output) as well as life vest by Dr. Einar Gip. Patient with LLE coolness post Impella removal and ABI with mild to moderate plaque in L-FA with moderate to severe reduction in arterial flow. Extubated on 11/30/13 and therapies initiated yesterday. Noted upon admission mild elevation in creatinine 1.82 since improved to 1.05 and monitored. CIR recommended by rehab team due to deconditioned state. Patient was admitted for comprehensive rehabilitation program  ROS Review of Systems  HENT: Negative for hearing loss.  Eyes: Negative for blurred vision and double vision.  Respiratory: Positive for cough.  Cardiovascular: Positive for chest pain (chest wall pain).  Gastrointestinal: Negative for heartburn and abdominal pain.  Musculoskeletal: Positive for myalgias.  Neurological: Negative for headaches.  Psychiatric/Behavioral: Positive for memory loss  Past Medical History   Diagnosis  Date   .  HTN (hypertension)    .  Seizures    .  Cardiac arrest    .  Cancer     History reviewed. No pertinent past surgical history.  No family history on file.  Social History: reports that he has never smoked. His  smokeless tobacco use includes Chew. His alcohol and drug histories are not on file.  Allergies:  Allergies   Allergen  Reactions   .  Oxycodone  Swelling     Tongue and lip    Medications Prior to Admission   Medication  Sig  Dispense  Refill   .  aspirin EC 81 MG tablet  Take 81 mg by mouth daily.     Marland Kitchen  docusate sodium (COLACE) 100 MG capsule  Take 100 mg by mouth 2 (two) times daily.     .  lansoprazole (PREVACID) 30 MG capsule  Take 30 mg by mouth daily.     Marland Kitchen  lisinopril (PRINIVIL,ZESTRIL) 20 MG tablet  Take 20 mg by mouth at bedtime.     .  metoprolol tartrate (LOPRESSOR) 25 MG tablet  Take 25 mg by mouth 2 (two) times daily.     .  phenytoin (DILANTIN) 100 MG ER capsule  Take 300 mg by mouth daily.     .  pravastatin (PRAVACHOL) 40 MG tablet  Take 40 mg by mouth daily.     .  probenecid (BENEMID) 500 MG tablet  Take 500 mg by mouth daily.      Home:  Home Living  Family/patient expects to be discharged to:: Inpatient rehab  Living Arrangements: Children  Available Help at Discharge: Family;Available 24 hours/day  Type of Home: House  Home Access: Stairs to enter  [employer] of Steps: 3  Entrance Stairs-Rails: None  Home Layout: One level  Home Equipment: Other (comment) (to be determined)  Functional History:  Prior Function  Level of Independence: Independent  Functional Status:  Mobility:  Bed Mobility  Overal bed mobility: Needs Assistance;+ 2 for safety/equipment  Bed Mobility: Supine to Sit  Supine to sit: Min assist;+2 for physical assistance  General bed mobility comments: VC's for sequencing and technique. Hand-over-hand to grab bed rails, and assist to initiate rolling with further assist to keep pt from rolling too close to EOB. +2 to achieve full upright sitting position.  Transfers  Overall transfer level: Needs assistance  Equipment used: Rolling walker (2 wheeled)  Transfers: Sit to/from Stand  Sit to Stand: Max assist  General transfer  comment: using a rocking momentum to complete sit<S.tand  Ambulation/Gait  Ambulation/Gait assistance: Min assist  Ambulation Distance (Feet): 150 Feet  Assistive device: Rolling walker (2 wheeled)  Gait Pattern/deviations: Step-through pattern;Decreased stride length;Trunk flexed  Gait velocity: Decreased  Gait velocity interpretation: Below normal speed for age/gender  General Gait Details: Verbal cues to stand more erect and to stay proper distance away from walker. Bil knee hyperextension in stance.   ADL:  ADL  Overall ADL's : Needs assistance/impaired  Grooming: Set up;Sitting (unable to static stand for grooming without steady support)  Lower Body Dressing: (able to cross biL LE sitting in recliner)  Toilet Transfer: Maximal assistance;RW  General ADL Comments: Pt sitting in chair on arrival. pt squeezing pillow to cough. Pt agreeable to OT after educated purpose of OT and reason for arrival.  Cognition:  Cognition  Overall Cognitive Status: No family/caregiver present to determine baseline cognitive functioning  Orientation Level: Oriented X4  Cognition  Arousal/Alertness: Awake/alert  Behavior During Therapy: WFL for tasks assessed/performed  Overall Cognitive Status: No family/caregiver present to determine baseline cognitive functioning  Memory: Decreased short-term memory    Physical Exam:  Blood pressure 129/73, pulse 91, temperature 97.9 F (36.6 C), temperature source Oral, resp. rate 17, height 5\' 8"  (1.727 m), weight 90.7 kg (199 lb 15.3 oz), SpO2 92.00%.    Constitutional: He is oriented to person, place, and time. He appears well-developed and well-nourished.  HENT: oral mucosa pink and moist Head: Normocephalic and atraumatic.  Eyes: Conjunctivae are normal. PERRL Neck: Normal range of motion. Neck supple.  Cardiovascular: Normal rate and regular rhythm. No rubs/galops No murmur heard. Life vest in place Respiratory:clear to auscultation without wheezes,  rales, rhonchi GI: Soft. Bowel sounds are normal. He exhibits no distension. Non-tender Musculoskeletal: He exhibits no edema. Has pain over manubrium and right lower rib border BLE warm to touch. No cyanosis. No pain with ROM.  Neurological: He is alert and oriented to person, place, and time.  Speech clear. Some problems with recall of recent incidents. Follows basic commands without difficulty. Mildly impulsive but improving insight and awareness. Behavior essentially appropriate.   motor strength is 4/5 bilateral deltoid, bicep, tricep, grip, hip flexors, knee extensors, ankle dorsiflexion plantar flexor  Sensation intact to light touch in bilateral upper and lower limbs.   Psych: appropriate at the conversation level    Results for orders placed during the hospital encounter of 11/26/13 (from the past 48 hour(s))   BASIC METABOLIC PANEL Status: Abnormal    Collection Time    12/04/13 4:09 AM   Result  Value  Ref Range    Sodium  139  137 - 147 mEq/L    Potassium  4.1  3.7 - 5.3 mEq/L    Chloride  103  96 - 112 mEq/L    CO2  18 (*)  19 - 32 mEq/L    Glucose, Bld  95  70 - 99 mg/dL    BUN  23  6 - 23 mg/dL    Creatinine, Ser  1.05  0.50 - 1.35 mg/dL    Calcium  8.5  8.4 - 10.5 mg/dL    GFR calc non Af Amer  74 (*)  >90 mL/min    GFR calc Af Amer  85 (*)  >90 mL/min    Comment:  (NOTE)     The eGFR has been calculated using the CKD EPI equation.     This calculation has not been validated in all clinical situations.     eGFR's persistently <90 mL/min signify possible Chronic Kidney     Disease.    No results found.  Medical Problem List and Plan:  1. Functional deficits secondary to deconditioning following cardiac arrest with hypoxic encephalopathy. Patient with life vest  2. DVT Prophylaxis/Anticoagulation: SCDs. Patient is ambulatory  3. Pain Management: Tylenol as needed. Add lidoderm patches for sternal/manubrial  pain 4. Neuropsych: This patient is capable of making  decisions on his own behalf.  -cognition appears to be improving  5. Hypertension. Coreg 12.5 mg twice a day, lisinopril 5 mg daily. Monitor with increased mobility  6. History of seizure disorder. Dilantin 300 mg each bedtime. Monitor for any signs of seizure activity  7. Acute renal failure. Admission creatinine 1.82 since improved to 1.14. Followup chemistries   Post Admission Physician Evaluation:  1. Functional deficits secondary to deconditioning and anoxic encephalopathy after cardiac arrest 2. Patient is admitted to receive collaborative, interdisciplinary care between the physiatrist, rehab nursing staff, and therapy team. 3. Patient's level of medical complexity and substantial therapy needs in context of that medical necessity cannot be provided at a lesser intensity of care such as a SNF. 4. Patient has experienced substantial functional loss from his/her baseline which was documented above under the "Functional History" and "Functional Status" headings. Judging by the patient's diagnosis, physical exam, and functional history, the patient has potential for functional progress which will result in measurable gains while on inpatient rehab. These gains will be of substantial and practical use upon discharge in facilitating mobility and self-care at the household level. 5. Physiatrist will provide 24 hour management of medical needs as well as oversight of the therapy plan/treatment and provide guidance as appropriate regarding the interaction of the two. 6. 24 hour rehab nursing will assist with bladder management, bowel management, safety, skin/wound care, disease management, medication administration, pain management and patient education and help integrate therapy concepts, techniques,education, etc. 7. PT will assess and treat for/with: Lower extremity strength, range of motion, stamina, balance, functional mobility, safety, adaptive techniques and equipment, NMR, cognitive perceptual  awareness, patient/caregiver education, monitoring of CV parameters. Goals are: mod I to supervision. 8. OT will assess and treat for/with: ADL's, functional mobility, safety, upper extremity strength, adaptive techniques and equipment, NMR, cognitive perceptual awareness, CV monitoring. Goals are: mod I to supervision. 9. SLP will assess and treat for/with: cognition. Goals are: mod I to supervision. 10. Case Management and Social Worker will assess and treat for psychological issues and discharge planning. 11. Team conference will be held weekly to assess progress toward goals and to determine barriers to discharge. 12. Patient will receive at least 3 hours of therapy per day at least 5 days per week. 13. ELOS: 7-10 days  14. Prognosis: excellent  Meredith Staggers, MD, [facility]   12/05/2013

## 2013-12-07 NOTE — Progress Notes (Signed)
Subjective/Complaints: Tolerating OT and cardiac rehab today.  Tolerated PT yesterday pm.  Stabe from cardiology standpoint per Dr Ganji.  Objective: Vital Signs: Blood pressure 107/57, pulse 90, temperature 97 F (36.1 C), temperature source Oral, resp. rate 20, height 5' 8" (1.727 m), weight 90.7 kg (199 lb 15.3 oz), SpO2 97.00%. No results found.  Recent Labs  12/02/13 1115  WBC 9.8  HGB 10.9*  HCT 31.5*  PLT 137*    Recent Labs  12/03/13 0452 12/04/13 0409  NA 142 139  K 3.8 4.1  CL 108 103  CO2 20 18*  GLUCOSE 86 95  BUN 23 23  CREATININE 1.11 1.05  CALCIUM 8.2* 8.5       Assessment/Plan: 1. Functional deficits secondary to Deconditioning and hypoxic encephalopathy which require 3+ hours per day of interdisciplinary therapy in a comprehensive inpatient rehab setting.  Physiatrist is providing close team supervision and 24 hour management of active medical problems listed below.  Physiatrist and rehab team continue to assess barriers to discharge/monitor patient progress toward functional and medical goals.  Rec admit to CIR in am with goals of supervision for all mobility.  Mod I Upper body ADLs and Sup lower body ADLs.  Mod I medication management with SLP and improve processing delay, memory and attention.  Estimated rehab LOS is 7-10 days FIM:                                      LOS: 8 days A FACE TO FACE EVALUATION WAS PERFORMED  Andrew E Kirsteins 12/04/2013, 4:10 PM    

## 2013-12-08 ENCOUNTER — Inpatient Hospital Stay (HOSPITAL_COMMUNITY): Payer: BC Managed Care – PPO | Admitting: Speech Pathology

## 2013-12-08 ENCOUNTER — Inpatient Hospital Stay (HOSPITAL_COMMUNITY): Payer: BC Managed Care – PPO | Admitting: *Deleted

## 2013-12-08 ENCOUNTER — Inpatient Hospital Stay (HOSPITAL_COMMUNITY): Payer: BC Managed Care – PPO | Admitting: Physical Therapy

## 2013-12-08 DIAGNOSIS — G931 Anoxic brain damage, not elsewhere classified: Secondary | ICD-10-CM

## 2013-12-08 NOTE — Progress Notes (Signed)
Patient admitted to the rehabilitation unit yesterday.  Subjective: She has no specific complaints. Objective:BP 124/71  Pulse 68  Temp(Src) 97.9 F (36.6 C) (Oral)  Resp 20  Wt 180 lb 8.9 oz (81.9 kg)  SpO2 92%  Elderly male in no acute distress. Chest clear to auscultation. Cardiac exam S1-S2 are regular. Abdominal exam active bowel sounds, soft. Extremities no edema.  Reviewed labs from Bo 6, 2015. Reviewed CBC from 53 2015.  Assessment and plan:  1. Functional deficits secondary to deconditioning following cardiac arrest with hypoxic encephalopathy. Patient with life vest  2. DVT Prophylaxis/Anticoagulation: SCDs. Patient is ambulatory  3. Pain Management: Tylenol as needed. lidoderm patches for sternal/manubrial pain  4. Neuropsych: This patient is capable of making decisions on his own behalf.  -cognition appears to be improving  5. Hypertension. Coreg 12.5 mg twice a day, lisinopril 5 mg daily. Monitor with increased mobility  BP Readings from Last 3 Encounters:  12/08/13 124/71  12/07/13 127/72  12/07/13 127/72   6. History of seizure disorder. Dilantin 300 mg each bedtime. Monitor for any signs of seizure activity  7. Acute renal failure. Admission creatinine 1.82 since improved to 1.14. Followup chemistries  Lab Results  Component Value Date   CREATININE 1.14 12/05/2013

## 2013-12-08 NOTE — Evaluation (Signed)
Occupational Therapy Assessment and Plan  Patient Details  Name: Gilbert [name] MRN: [id number] Date of Birth: Aug 05, 1950  OT Diagnosis: acute pain Rehab Potential: Rehab Potential: Excellent ELOS: 7-9 days   Today's Date: 12/08/2013 Time:  - 0930-1030  (16mn)  1st session    Problem List:  Patient Active Problem List   Diagnosis Date Noted  . Encephalopathy acute 12/07/2013  . Cardiac arrest 11/27/2013  . Seizures 11/27/2013  . HTN (hypertension) 11/27/2013  . Acute respiratory failure with hypoxia 11/27/2013  . Shock circulatory 11/27/2013  . Hypomagnesemia 11/27/2013  . Renal cancer 10/19/2013  . Renal mass, right 10/10/2013  . Coronary artery calcification 09/25/2013  . Renal cyst 08/30/2013  . Loss of weight 08/03/2013    Past Medical History:  Past Medical History  Diagnosis Date  . Gout   . GERD (gastroesophageal reflux disease)   . Hypertension   . Hypercholesteremia   . Cancer     right kidney cancer  . Shortness of breath     with activity  . Epilepsy     at least 10 years ago   . Family history of anesthesia complication     mother-had stroke during anesthesia  . HTN (hypertension)   . Seizures   . Cardiac arrest   . Cancer    Past Surgical History:  Past Surgical History  Procedure Laterality Date  . No past surgeries    . Eye surgery Left 2 weeks ago    torn retina  . Robot assisted laparoscopic nephrectomy Right 10/19/2013    Procedure: ROBOTIC ASSISTED LAPAROSCOPIC NEPHRECTOMY,  EXTENSIVE ADHESIOLYSIS;  Surgeon: TAlexis Frock MD;  Location: [facility]S;  Service: Urology;  Laterality: Right;    Assessment & Plan Clinical Impression: [name] is a 63y.o. male with history of seizures, HTN, recent nephrectomy for CA, who was found unresponsive in his chair on 11/27/13--downtime unknown. When the EMS came after activation from the family members, patient was found to be in V. fib/VF, was defibrillated, eventually on presentation to the emergency  room, intubated and resuscitated. hypothermia protocol initiated and 2D echo with EF 25-30% with suggestion of sever LAD territory ischemia or possibly Takatsubo CM. Cardiogenic shock treated with pressors and aspiration PNA with pneumonitis treated with IV antibiotics. He underwent cardiac cath with PTCA and stenting of left main and proximal LAD and placement of Impella CP device (to help with cardiac output) as well as life vest by Dr. GEinar Gip Patient with LLE coolness post Impella removal and ABI with mild to moderate plaque in L-FA with moderate to severe reduction in arterial flow. Extubated on 11/30/13 and therapies initiated yesterday. Noted upon admission mild elevation in creatinine 1.82 since improved to 1.05 and monitored. CIR recommended by rehab team due to deconditioned state Patient transferred to CIR on 12/07/2013 .    Patient currently requires mod with basic self-care skills secondary to muscle weakness and decreased cardiorespiratoy endurance.  Prior to hospitalization, patient could complete BADL with independent .  Patient will benefit from skilled intervention to increase independence with basic self-care skills prior to discharge home with care partner.  Anticipate patient will require intermittent supervision and follow up home health.  OT - End of Session Activity Tolerance: Tolerates < 10 min activity, no significant change in vital signs Endurance Deficit: Yes Endurance Deficit Description: pt report of dizziness with orthostasis following ambulation OT Assessment Rehab Potential: Excellent OT Patient demonstrates impairments in the following area(s): Balance;Cognition;Endurance;Motor;Pain;Safety OT Basic ADL's Functional Problem(s): Grooming;Bathing;Dressing;Toileting OT  Advanced ADL's Functional Problem(s): Simple Meal Preparation OT Transfers Functional Problem(s): Toilet;Tub/Shower OT Additional Impairment(s): None OT Plan OT Intensity: Minimum of 1-2 x/day, 45 to 90  minutes OT Frequency: 5 out of 7 days OT Duration/Estimated Length of Stay: 7-9 days OT Treatment/Interventions: Balance/vestibular training;Discharge planning;DME/adaptive equipment instruction;Functional mobility training;Neuromuscular re-education;Pain management;Self Care/advanced ADL retraining;Therapeutic Activities;Therapeutic Exercise;UE/LE Strength taining/ROM;Visual/perceptual remediation/compensation;Community reintegration OT Self Feeding Anticipated Outcome(s): independent OT Basic Self-Care Anticipated Outcome(s): mod I  OT Toileting Anticipated Outcome(s): mod I OT Bathroom Transfers Anticipated Outcome(s): mod I with toilet;  supervision with shower OT Recommendation Patient destination: Home Follow Up Recommendations: Home health OT Equipment Recommended: Tub/shower bench   Skilled Therapeutic Intervention 2nd session:  Time:1330-1400  (30 min) Pain:rib pain  7/10 Individual session:  Addressed tub transfer in ADL apartment.  Discussed adaptive equipment for tub.  Pt transferred from RW to bench with minimal assist to SBA.  Used grab bars.  Discusssed having grab bars installed for safety and Portis shower hose.  Recommend tub bench vs shower seat depending on pt's bathroom accomodation to bench.  Pt. Taken back to room and left with call bell in hand.     OT Evaluation Precautions/Restrictions  Precautions Precautions: Fall Precaution Comments: significant Chest pain with activity, likely due to chest compressions during CPR Restrictions Weight Bearing Restrictions: No       Pain  7/10 rib pain  (1st session)   Home Living/Prior Functioning Home Living Available Help at Discharge: Family;Available PRN/intermittently Type of Home: House Home Access: Stairs to enter Entrance Stairs-Number of Steps: 4 Entrance Stairs-Rails: None Home Layout: One level  Lives With: Daughter IADL History Homemaking Responsibilities: Yes Meal Prep Responsibility: Secondary Laundry  Responsibility: Secondary Cleaning Responsibility: No Bill Paying/Finance Responsibility: No Shopping Responsibility: No Child Care Responsibility: No Current License: Yes Mode of Transportation: Car Occupation: Retired Cabin crew) Leisure and Hobbies:  (golf) IADL Comments:  (yard work, garden, Data processing manager) Prior Function Level of Independence: Independent with gait  Able to Take Stairs?: Yes Driving: Yes Vocation: Retired Biomedical scientist: Games developer Leisure: Hobbies-yes (Comment) Comments: golf, fishing ADL   Vision/Perception  Vision- Assessment Eye Alignment: Within Functional Limits Perception Perception: Within Functional Limits Praxis Praxis: Intact  Cognition Overall Cognitive Status: Impaired/Different from baseline Arousal/Alertness: Awake/alert Orientation Level: Oriented X4 Attention: Selective Selective Attention: Appears intact Memory: Impaired Memory Impairment: Decreased recall of new information;Decreased long term memory;Decreased short term memory Decreased Long Term Memory: Verbal complex;Functional complex Decreased Short Term Memory: Verbal complex;Functional complex Awareness: Appears intact Problem Solving: Impaired Problem Solving Impairment: Functional basic Executive Function: Organizing Organizing: Impaired Organizing Impairment: Verbal complex Safety/Judgment: Appears intact Comments: Pt required supervision question cues and extra time to self-monitor and correct errors during functional problem solving tasks.  Sensation Sensation Light Touch: Appears Intact Hot/Cold: Appears Intact Proprioception: Appears Intact Coordination Gross Motor Movements are Fluid and Coordinated: Yes Fine Motor Movements are Fluid and Coordinated: Yes Motor  Motor Motor: Within Functional Limits Motor - Skilled Clinical Observations: generalized weakness Mobility  Bed Mobility Bed Mobility: Sit to Supine;Supine to Sit Supine to  Sit: 4: Min guard;HOB flat Sit to Supine: 4: Min guard;HOB flat  Trunk/Postural Assessment  Cervical Assessment Cervical Assessment: Within Functional Limits Thoracic Assessment Thoracic Assessment: Within Functional Limits Lumbar Assessment Lumbar Assessment: Within Functional Limits Postural Control Postural Control: Within Functional Limits  Balance Balance Balance Assessed: Yes Static Sitting Balance Static Sitting - Balance Support: Feet supported Static Sitting - Level of Assistance: 7: Independent Dynamic Sitting Balance Dynamic  Sitting - Balance Support: Feet supported;During functional activity Dynamic Sitting - Level of Assistance: 7: Independent Static Standing Balance Static Standing - Balance Support: Bilateral upper extremity supported Static Standing - Level of Assistance: 5: Stand by assistance Extremity/Trunk Assessment RUE Assessment RUE Assessment: Within Functional Limits LUE Assessment LUE Assessment: Within Functional Limits     Refer to Care Plan for Long Term Goals  Recommendations for other services: None  Discharge Criteria: Patient will be discharged from OT if patient refuses treatment 3 consecutive times without medical reason, if treatment goals not met, if there is a change in medical status, if patient makes no progress towards goals or if patient is discharged from hospital.  The above assessment, treatment plan, treatment alternatives and goals were discussed and mutually agreed upon: by patient  Lisa Roca 12/08/2013, 7:35 PM

## 2013-12-08 NOTE — Evaluation (Signed)
Physical Therapy Assessment and Plan  Patient Details  Name: Gilbert [name] MRN: [id number] Date of Birth: [birth year]  PT Diagnosis: Difficulty walking, Muscle weakness and Pain in chest Rehab Potential: Excellent ELOS: 7-10 days   Today's Date: 12/08/2013 Time: 1040-1140 Time Calculation (min): 60 min  Problem List:  Patient Active Problem List   Diagnosis Date Noted  . Encephalopathy acute 12/07/2013  . Cardiac arrest 11/27/2013  . Seizures 11/27/2013  . HTN (hypertension) 11/27/2013  . Acute respiratory failure with hypoxia 11/27/2013  . Shock circulatory 11/27/2013  . Hypomagnesemia 11/27/2013  . Renal cancer 10/19/2013  . Renal mass, right 10/10/2013  . Coronary artery calcification 09/25/2013  . Renal cyst 08/30/2013  . Loss of weight 08/03/2013    Past Medical History:  Past Medical History  Diagnosis Date  . Gout   . GERD (gastroesophageal reflux disease)   . Hypertension   . Hypercholesteremia   . Cancer     right kidney cancer  . Shortness of breath     with activity  . Epilepsy     at least 10 years ago   . Family history of anesthesia complication     mother-had stroke during anesthesia  . HTN (hypertension)   . Seizures   . Cardiac arrest   . Cancer    Past Surgical History:  Past Surgical History  Procedure Laterality Date  . No past surgeries    . Eye surgery Left 2 weeks ago    torn retina  . Robot assisted laparoscopic nephrectomy Right 10/19/2013    Procedure: ROBOTIC ASSISTED LAPAROSCOPIC NEPHRECTOMY,  EXTENSIVE ADHESIOLYSIS;  Surgeon: Alexis Frock, MD;  Location: [facility];  Service: Urology;  Laterality: Right;    Assessment & Plan Clinical Impression: Gilbert [name] is a 62 y.o. male with history of seizures, HTN, recent nephrectomy for CA, who was found unresponsive in his chair on 11/27/13--downtime unknown. When the EMS came after activation from the family members, patient was found to be in V. fib/VF, was defibrillated, eventually on  presentation to the emergency room, intubated and resuscitated. hypothermia protocol initiated and 2D echo with EF 25-30% with suggestion of sever LAD territory ischemia or possibly Takatsubo CM. Cardiogenic shock treated with pressors and aspiration PNA with pneumonitis treated with IV antibiotics. He underwent cardiac cath with PTCA and stenting of left main and proximal LAD and placement of Impella CP device (to help with cardiac output) as well as life vest by Dr. Einar Gip. Patient with LLE coolness post Impella removal and ABI with mild to moderate plaque in L-FA with moderate to severe reduction in arterial flow. Extubated on 11/30/13 and therapies initiated yesterday. Noted upon admission mild elevation in creatinine 1.82 since improved to 1.05 and monitored. CIR recommended by rehab team due to deconditioned state. Patient transferred to CIR on 12/07/2013 .   Patient currently requires min assist with mobility secondary to muscle weakness and decreased cardiorespiratoy endurance.  Prior to hospitalization, patient was independent  with mobility and lived with Daughter (and daughter's 2 sons) in a House home.  Home access is 4Stairs to enter.  Patient will benefit from skilled PT intervention to maximize safe functional mobility, minimize fall risk and decrease caregiver burden for planned discharge home with intermittent assist.  Anticipate patient will benefit from follow up OP at discharge.  PT - End of Session Activity Tolerance: Tolerates < 10 min activity with changes in vital signs Endurance Deficit: Yes Endurance Deficit Description: pt report of dizziness with orthostasis following ambulation PT  Assessment Rehab Potential: Excellent Barriers to Discharge: Inaccessible home environment;Decreased caregiver support PT Patient demonstrates impairments in the following area(s): Balance;Endurance;Motor;Safety;Pain PT Transfers Functional Problem(s): Bed Mobility;Bed to  Chair;Car;Furniture;Floor PT Locomotion Functional Problem(s): Ambulation;Wheelchair Mobility;Stairs PT Plan PT Intensity: Minimum of 1-2 x/day ,45 to 90 minutes PT Frequency: 5 out of 7 days PT Duration Estimated Length of Stay: 7-10 days PT Treatment/Interventions: Ambulation/gait training;Balance/vestibular training;Discharge planning;DME/adaptive equipment instruction;Functional mobility training;Neuromuscular re-education;Pain management;Patient/family education;Stair training;Therapeutic Exercise;Therapeutic Activities;UE/LE Strength taining/ROM;UE/LE Coordination activities PT Transfers Anticipated Outcome(s): mod I  PT Locomotion Anticipated Outcome(s): overall supervision/mod I PT Recommendation Recommendations for Other Services: Neuropsych consult Follow Up Recommendations: Outpatient PT Patient destination: Home Equipment Recommended: Rolling walker with 5" wheels  Skilled Therapeutic Intervention Skilled therapeutic intervention initiated after completion of evaluation. Discussed with patient and 2 daughters falls risk, safety within room, and focus of therapy during stay. Discussed possible LOS, goals, and f/u therapy. Pt with report of dizziness during ambulation, see vitals below. After short seated rest, BP 86/60. RN notified and therapist donned TED hose. Pt education provided for slowing down with transitional movements/waiting to check for dizziness before moving upon initial standing.   PT Evaluation Precautions/Restrictions Precautions Precautions: Fall Precaution Comments:  (chest pain with activity and coughing) Restrictions Weight Bearing Restrictions: No General Chart Reviewed: Yes Family/Caregiver Present: Yes (2 daughters) Vital SignsTherapy Vitals Pulse Rate: 73 BP: 106/61 mmHg Patient Position, if appropriate: Sitting (after ambulation) Oxygen Therapy SpO2: 93 % O2 Device: None (Room air) Pulse Oximetry Type: Intermittent Pain Pain Assessment Pain  Assessment: No/denies pain Pain Score: 0-No pain Home Living/Prior Functioning Home Living Available Help at Discharge: Family;Available PRN/intermittently Type of Home: House Home Access: Stairs to enter [employer] of Steps: 4 Entrance Stairs-Rails: None Home Layout: One level  Lives With: Daughter (and daughter's 2 sons) Prior Function Level of Independence: Independent with gait  Able to Take Stairs?: Yes Driving: Yes Vocation: Retired Biomedical scientist: Games developer Leisure: Hobbies-yes (Comment) Comments: golf, fishing (golf) Vision/Perception  Vision - History Baseline Vision: Wears glasses all the time Visual History: Other (comment) (Has floaters) Vision - Assessment Eye Alignment: Within Functional Limits Vision Assessment: Vision not tested Cognition Overall Cognitive Status: Impaired/Different from baseline Arousal/Alertness: Awake/alert Orientation Level: Oriented X4 Attention: Selective Selective Attention: Appears intact Memory: Impaired Memory Impairment: Decreased recall of new information;Decreased long term memory;Decreased short term memory Decreased Long Term Memory: Verbal complex;Functional complex Decreased Short Term Memory: Verbal complex;Functional complex Awareness: Appears intact Problem Solving: Impaired Problem Solving Impairment: Functional basic Executive Function: Organizing Organizing: Impaired Organizing Impairment: Verbal complex Safety/Judgment: Appears intact Comments: Pt required supervision question cues and extra time to self-monitor and correct errors during functional problem solving tasks.  Sensation Sensation Light Touch: Appears Intact Hot/Cold: Appears Intact Proprioception: Appears Intact Coordination Gross Motor Movements are Fluid and Coordinated: Yes Fine Motor Movements are Fluid and Coordinated: Yes Motor  Motor Motor: Within Functional Limits Motor - Skilled Clinical Observations: generalized  weakness  Mobility Bed Mobility Bed Mobility: Sit to Supine;Supine to Sit (verbal cues/demonstration for technique) Supine to Sit: 4: Min guard;HOB flat Sit to Supine: 4: Min guard;HOB flat Transfers Transfers: Yes Stand Pivot Transfers: 4: Min guard;With armrests Locomotion  Ambulation Ambulation/Gait Assistance: 4: Min guard;5: Supervision Ambulation Distance (Feet): 100 Feet (stopped due to pt report of dizziness) Gait Gait: Yes Gait Pattern: Within Functional Limits Gait velocity: Decreased High Level Ambulation High Level Ambulation: Side stepping;Backwards walking Side Stepping: during transfers Backwards Walking: during transfers Stairs / Additional Locomotion Stairs: Yes Stairs Assistance:  4: Min guard Stair Management Technique: Two rails;Step to pattern;Forwards Number of Stairs: 5 Height of Stairs: 6 Architect: Yes Wheelchair Assistance: 5: Investment banker, operational Details: Verbal cues for Marketing executive: Both upper extremities Wheelchair Parts Management: Needs assistance Distance: 150  Trunk/Postural Assessment  Cervical Assessment Cervical Assessment: Within Scientist, physiological Assessment: Within Functional Limits Lumbar Assessment Lumbar Assessment: Within Functional Limits  Balance Balance Balance Assessed: Yes Static Sitting Balance Static Sitting - Balance Support: Feet supported Static Sitting - Level of Assistance: 7: Independent Dynamic Sitting Balance Dynamic Sitting - Balance Support: Feet supported;During functional activity Dynamic Sitting - Level of Assistance: 7: Independent Static Standing Balance Static Standing - Balance Support: Bilateral upper extremity supported Static Standing - Level of Assistance: 5: Stand by assistance Static Standing - Comment/# of Minutes: 3 Extremity Assessment  RUE Assessment RUE Assessment: Within Functional Limits LUE  Assessment LUE Assessment: Within Functional Limits RLE Assessment RLE Assessment: Within Functional Limits (Overall WFL but R side weaker than L side) LLE Assessment LLE Assessment: Within Functional Limits  FIM:  FIM - Control and instrumentation engineer Devices: Walker;Arm rests Bed/Chair Transfer: 4: Supine > Sit: Min A (steadying Pt. > 75%/lift 1 leg);4: Sit > Supine: Min A (steadying pt. > 75%/lift 1 leg);4: Bed > Chair or W/C: Min A (steadying Pt. > 75%);4: Chair or W/C > Bed: Min A (steadying Pt. > 75%) FIM - Locomotion: Wheelchair Distance: 150 Locomotion: Wheelchair: 5: Travels 150 ft or more: maneuvers on rugs and over door sills with supervision, cueing or coaxing FIM - Locomotion: Ambulation Locomotion: Ambulation Assistive Devices: Administrator Ambulation/Gait Assistance: 4: Min guard;5: Supervision Locomotion: Ambulation: 2: Travels 50 - 149 ft with minimal assistance (Pt.>75%) FIM - Locomotion: Stairs Locomotion: Scientist, physiological: Hand rail - 2 Locomotion: Stairs: 2: Up and Down 4 - 11 stairs with minimal assistance (Pt.>75%)   Refer to Care Plan for Long Term Goals  Recommendations for other services: Neuropsych  Discharge Criteria: Patient will be discharged from PT if patient refuses treatment 3 consecutive times without medical reason, if treatment goals not met, if there is a change in medical status, if patient makes no progress towards goals or if patient is discharged from hospital.  The above assessment, treatment plan, treatment alternatives and goals were discussed and mutually agreed upon: by patient and by family  Laretta Alstrom 12/08/2013, 12:05 PM

## 2013-12-08 NOTE — Evaluation (Signed)
Speech Language Pathology Assessment and Plan  Patient Details  Name: Gilbert [name] MRN: [id number] Date of Birth: 01-12-1951  SLP Diagnosis: Cognitive Impairments  Rehab Potential: Excellent ELOS: 7-10 days    Today's Date: 12/08/2013 Time: 0800-0855 Time Calculation (min): 55 min  Problem List:  Patient Active Problem List   Diagnosis Date Noted  . Encephalopathy acute 12/07/2013  . Cardiac arrest 11/27/2013  . Seizures 11/27/2013  . HTN (hypertension) 11/27/2013  . Acute respiratory failure with hypoxia 11/27/2013  . Shock circulatory 11/27/2013  . Hypomagnesemia 11/27/2013  . Renal cancer 10/19/2013  . Renal mass, right 10/10/2013  . Coronary artery calcification 09/25/2013  . Renal cyst 08/30/2013  . Loss of weight 08/03/2013   Past Medical History:  Past Medical History  Diagnosis Date  . Gout   . GERD (gastroesophageal reflux disease)   . Hypertension   . Hypercholesteremia   . Cancer     right kidney cancer  . Shortness of breath     with activity  . Epilepsy     at least 10 years ago   . Family history of anesthesia complication     mother-had stroke during anesthesia  . HTN (hypertension)   . Seizures   . Cardiac arrest   . Cancer    Past Surgical History:  Past Surgical History  Procedure Laterality Date  . No past surgeries    . Eye surgery Left 2 weeks ago    torn retina  . Robot assisted laparoscopic nephrectomy Right 10/19/2013    Procedure: ROBOTIC ASSISTED LAPAROSCOPIC NEPHRECTOMY,  EXTENSIVE ADHESIOLYSIS;  Surgeon: Gilbert Frock, MD;  Location: [facility];  Service: Urology;  Laterality: Right;    Assessment / Plan / Recommendation Clinical Impression Pt is a 63 y.o. male with history of seizures, HTN, recent nephrectomy for CA, who was found unresponsive in his chair on 11/27/13--downtime unknown. When the EMS came after activation from the family members, patient was found to be in V. fib/VF, was defibrillated, eventually on presentation to the  emergency room, intubated and resuscitated. hypothermia protocol initiated and 2D echo with EF 25-30% with suggestion of sever LAD territory ischemia or possibly Takatsubo CM. Cardiogenic shock treated with pressors and aspiration PNA with pneumonitis treated with IV antibiotics. He underwent cardiac cath with PTCA and stenting of left main and proximal LAD and placement of Impella CP device (to help with cardiac output) as well as life vest by Dr. Einar [name]. Patient with LLE coolness post Impella removal and ABI with mild to moderate plaque in L-FA with moderate to severe reduction in arterial flow. Extubated on 11/30/13 and therapies initiated yesterday. Noted upon admission mild elevation in creatinine 1.82 since improved to 1.05 and monitored. CIR recommended by rehab team due to deconditioned state. Patient was admitted for comprehensive rehabilitation program on 12/07/13 and presents with mild cognitive impairments impacting pt's short-term memory, functional problem solving and thought organization impacting pt's ability to perform functional tasks safely and his overall word-finding during complex conversation. Pt would benefit from skilled SLP intervention to maximize his cognitive function in order to maximize his overall functional independence. Anticipate pt Gilbert [name] need intermittent to 24 hour supervision and f/u SLP services.   Skilled Therapeutic Interventions          Administered a cognitive-linguistic evaluation. Please see above for details. Educated pt on current cognitive-linguistic function and goals of skilled SLP intervention. Pt verbalized understanding.   SLP Assessment  Patient will need skilled Speech Lanaguage Pathology Services during CIR admission  Recommendations  Oral Care Recommendations: Oral care BID Recommendations for Other Services: Neuropsych consult Patient destination: Home Follow up Recommendations: Outpatient SLP (TBD, [place] need intermittent supervision ) Equipment  Recommended: None recommended by SLP    SLP Frequency 5 out of 7 days   SLP Treatment/Interventions Cognitive remediation/compensation;Cueing hierarchy;Functional tasks;Environmental controls;Internal/external aids;Speech/Language facilitation;Patient/family education;Therapeutic Activities    Pain Pain in chest when coughing, RN aware.   Short Term Goals: Week 1: SLP Short Term Goal 1 (Week 1): Pt will utilize external memory aids to recall  new, daily information with supervision multimodal cues.  SLP Short Term Goal 2 (Week 1): Pt will demonstrate functional problem solving for complex and familiar tasks with Mod I.  SLP Short Term Goal 3 (Week 1): Pt will utilize word-finding strategies at the conversation level with supervision question cues.   See FIM for current functional status Refer to Care Plan for Long Term Goals  Recommendations for other services: Neuropsych  Discharge Criteria: Patient will be discharged from SLP if patient refuses treatment 3 consecutive times without medical reason, if treatment goals not met, if there is a change in medical status, if patient makes no progress towards goals or if patient is discharged from hospital.  The above assessment, treatment plan, treatment alternatives and goals were discussed and mutually agreed upon: by patient  Gilbert [name] 12/08/2013, 9:21 AM

## 2013-12-09 ENCOUNTER — Inpatient Hospital Stay (HOSPITAL_COMMUNITY): Payer: BC Managed Care – PPO | Admitting: *Deleted

## 2013-12-09 NOTE — Progress Notes (Signed)
Patient admitted to the rehabilitation unit yesterday.  Subjective: he has no specific complaints. Objective:BP 126/74  Pulse 74  Temp(Src) 97.6 F (36.4 C) (Oral)  Resp 16  Wt 180 lb 8.9 oz (81.9 kg)  SpO2 95%  Chronically ill-appearing elderly male in no acute distress. Chest is clear to auscultation. Cardiac exam S1-S2 are regular. Abdominal exam active bowel sounds, soft. Extremities no edema.    Assessment and plan:  1. Functional deficits secondary to deconditioning following cardiac arrest with hypoxic encephalopathy. Patient with life vest  2. DVT Prophylaxis/Anticoagulation: SCDs. Patient is ambulatory  3. Pain Management: Tylenol as needed. lidoderm patches for sternal/manubrial pain  4. Neuropsych: This patient is capable of making decisions on his own behalf.  -cognition appears to be improving  5. Hypertension. Coreg 12.5 mg twice a day, lisinopril 5 mg daily. Monitor with increased mobility  BP Readings from Last 3 Encounters:  12/09/13 126/74  12/07/13 127/72  12/07/13 127/72   6. History of seizure disorder. Dilantin 300 mg each bedtime. Monitor for any signs of seizure activity  7. Acute renal failure. Admission creatinine 1.82 since improved to 1.14. Followup chemistries  Lab Results  Component Value Date   CREATININE 1.14 12/05/2013

## 2013-12-09 NOTE — Progress Notes (Signed)
Occupational Therapy Session Note  Patient Details  Name: Gilbert [name] MRN: [id number] Date of Birth: Jun 16, 1951  Today's Date: 12/09/2013 Time: 1630-1730 Time Calculation (min): 60 min  Short Term Goals: Week 1:   STG=LTG  Skilled Therapeutic Interventions/Progress Updates:      Pt. Sitting in wc upon OT arrival.  Pt. Propelled wc to ADL apt.  Addressed functional balance, endurance, functional mobility, and safety with simple meal prep.  Pt. Ambulated in kitchen to retrieve items with supervision.  He was mod I with simple meal prep using cook top and gathering supplies.  He propelled wc down to cafeteria to get bread in order to make a sandwich.  Educated pt and friend on adaptations for bathtub.  Suggested grab bars for bath tub and tub and shower seat vs tub bench for home.  Recommend shower seat as pt wants to be able to sit down in tub as well as take a shower.  Need to practice sitting down in tub to see if this is feasible option as well as wet run in the ADL bathroom.    Therapy Documentation Precautions:  Precautions Precautions: Fall Precaution Comments: significant Chest pain with activity, likely due to chest compressions during CPR Restrictions Weight Bearing Restrictions: No     Pain:  4/10 rib pain         See FIM for current functional status  Therapy/Group: Individual Therapy  Lisa Roca 12/09/2013, 6:03 PM

## 2013-12-10 ENCOUNTER — Encounter (HOSPITAL_COMMUNITY): Payer: Self-pay | Admitting: Occupational Therapy

## 2013-12-10 ENCOUNTER — Inpatient Hospital Stay (HOSPITAL_COMMUNITY): Payer: BC Managed Care – PPO

## 2013-12-10 ENCOUNTER — Inpatient Hospital Stay (HOSPITAL_COMMUNITY): Payer: BC Managed Care – PPO | Admitting: Occupational Therapy

## 2013-12-10 ENCOUNTER — Inpatient Hospital Stay (HOSPITAL_COMMUNITY): Payer: BC Managed Care – PPO | Admitting: Physical Therapy

## 2013-12-10 LAB — CBC WITH DIFFERENTIAL/PLATELET
Basophils Absolute: 0 10*3/uL (ref 0.0–0.1)
Basophils Relative: 0 % (ref 0–1)
Eosinophils Absolute: 0.7 10*3/uL (ref 0.0–0.7)
Eosinophils Relative: 9 % — ABNORMAL HIGH (ref 0–5)
HCT: 31.6 % — ABNORMAL LOW (ref 39.0–52.0)
Hemoglobin: 10.4 g/dL — ABNORMAL LOW (ref 13.0–17.0)
LYMPHS PCT: 17 % (ref 12–46)
Lymphs Abs: 1.3 10*3/uL (ref 0.7–4.0)
MCH: 28.2 pg (ref 26.0–34.0)
MCHC: 32.9 g/dL (ref 30.0–36.0)
MCV: 85.6 fL (ref 78.0–100.0)
Monocytes Absolute: 0.4 10*3/uL (ref 0.1–1.0)
Monocytes Relative: 5 % (ref 3–12)
NEUTROS PCT: 69 % (ref 43–77)
Neutro Abs: 5.1 10*3/uL (ref 1.7–7.7)
PLATELETS: 212 10*3/uL (ref 150–400)
RBC: 3.69 MIL/uL — AB (ref 4.22–5.81)
RDW: 17.5 % — ABNORMAL HIGH (ref 11.5–15.5)
WBC: 7.4 10*3/uL (ref 4.0–10.5)

## 2013-12-10 LAB — COMPREHENSIVE METABOLIC PANEL
ALT: 43 U/L (ref 0–53)
AST: 19 U/L (ref 0–37)
Albumin: 2.9 g/dL — ABNORMAL LOW (ref 3.5–5.2)
Alkaline Phosphatase: 181 U/L — ABNORMAL HIGH (ref 39–117)
BUN: 15 mg/dL (ref 6–23)
CO2: 22 meq/L (ref 19–32)
Calcium: 8.9 mg/dL (ref 8.4–10.5)
Chloride: 104 mEq/L (ref 96–112)
Creatinine, Ser: 1.18 mg/dL (ref 0.50–1.35)
GFR calc non Af Amer: 64 mL/min — ABNORMAL LOW (ref >90)
GFR, EST AFRICAN AMERICAN: 74 mL/min — AB (ref >90)
GLUCOSE: 89 mg/dL (ref 70–99)
POTASSIUM: 4.4 meq/L (ref 3.7–5.3)
SODIUM: 140 meq/L (ref 137–147)
Total Bilirubin: 0.4 mg/dL (ref 0.3–1.2)
Total Protein: 6.3 g/dL (ref 6.0–8.3)

## 2013-12-10 MED ORDER — PRASUGREL HCL 10 MG PO TABS
10.0000 mg | ORAL_TABLET | Freq: Every day | ORAL | Status: DC
  Administered 2013-12-10 – 2013-12-11 (×2): 10 mg via ORAL
  Filled 2013-12-10 (×3): qty 1

## 2013-12-10 NOTE — Care Management Note (Signed)
Inpatient [facility] Individual Statement of Services  Patient Name:  Gilbert [name]  Date:  12/10/2013  Welcome to the [facility].  Our goal is to provide you with an individualized program based on your diagnosis and situation, designed to meet your specific needs.  With this comprehensive rehabilitation program, you will be expected to participate in at least 3 hours of rehabilitation therapies Monday-Friday, with modified therapy programming on the weekends.  Your rehabilitation program will include the following services:  Physical Therapy (PT), Occupational Therapy (OT), Speech Therapy (ST), 24 hour per day rehabilitation nursing, Therapeutic Recreaction (TR), Case Management (Social Worker), Rehabilitation Medicine, Nutrition Services and Pharmacy Services  Weekly team conferences will be held on Tuesdays to discuss your progress.  Your Social Worker will talk with you frequently to get your input and to update you on team discussions.  Team conferences with you and your family in attendance Madani also be held.  Expected length of stay: 5 days  Overall anticipated outcome: modified independent  Depending on your progress and recovery, your program [place] change. Your Social Worker will coordinate services and will keep you informed of any changes. Your Social Worker's name and contact numbers are listed  below.  The following services [place] also be recommended but are not provided by the [facility] will be made to provide these services after discharge if needed.  Arrangements include referral to agencies that provide these services.  Your insurance has been verified to be:  [place] Your primary doctor is:  Dr. Everlene Farrier  Pertinent information will be shared with your doctor and your insurance  company.  Social Worker:  Lennart Pall, [place] or [phone]   Information discussed with and copy given to patient by: Lennart Pall, 12/10/2013, 3:09 PM

## 2013-12-10 NOTE — Progress Notes (Signed)
Physical Therapy Session Note  Patient Details  Name: Gilbert [name] MRN: [id number] Date of Birth: 22-Jan-1951  Today's Date: 12/10/2013 Time:  - 815-855 40 minutes      Skilled Therapeutic Interventions/Progress Updates: pt able to gait with distant supervision > 200' with RW in controlled environment, no LOB or dizziness.  Pt performed gait in home environment with supervision with RW.  Stair negotiation with B handrails with supervision x 10 steps, step to pattern.  Berg balance test performed, pt scored 49/56.  Educated on need for RW at this point to reduce fall risk.  [place] HEP initiated for balance and strength training.  Pt able to perform program with cues for technique.     Therapy Documentation Pain:  no c/o pain   Balance: Standardized Balance Assessment Standardized Balance Assessment: Berg Balance Test Berg Balance Test Sit to Stand: Able to stand without using hands and stabilize independently Standing Unsupported: Able to stand safely 2 minutes Sitting with Back Unsupported but Feet Supported on Floor or Stool: Able to sit safely and securely 2 minutes Stand to Sit: Sits safely with minimal use of hands Transfers: Able to transfer safely, minor use of hands Standing Unsupported with Eyes Closed: Able to stand 10 seconds safely Standing Ubsupported with Feet Together: Able to place feet together independently and stand 1 minute safely From Standing, Reach Forward with Outstretched Arm: Can reach forward >12 cm safely (5") From Standing Position, Pick up Object from Floor: Able to pick up shoe safely and easily From Standing Position, Turn to Look Behind Over each Shoulder: Looks behind from both sides and weight shifts well Turn 360 Degrees: Able to turn 360 degrees safely in 4 seconds or less Standing Unsupported, Alternately Place Feet on Step/Stool: Able to stand independently and complete 8 steps >20 seconds Standing Unsupported, One Foot in Front: Able to plae foot  ahead of the other independently and hold 30 seconds Standing on One Leg: Unable to try or needs assist to prevent fall Total Score: 49  See FIM for current functional status  Therapy/Group: Individual Therapy  Kennith Gain 12/10/2013, 8:34 AM

## 2013-12-10 NOTE — Discharge Summary (Signed)
Discharge summary job [id number]

## 2013-12-10 NOTE — Progress Notes (Signed)
Patient information reviewed and entered into eRehab system by Tamura Lasky, RN, CRRN, PPS Coordinator.  Information including medical coding and functional independence measure will be reviewed and updated through discharge.    

## 2013-12-10 NOTE — Progress Notes (Signed)
Social Work Patient ID: Gilbert [name], male   DOB: 1950/12/28, 63 y.o.   MRN: [id number]   Tx team reports pt is reaching mod i goals and they feel could d/c tomorrow.  MD aware and agreeable.  Pt and family agreeable.  Have set up [facility] f/u.  Continue to follow.  Lennart Pall, LCSW

## 2013-12-10 NOTE — Progress Notes (Signed)
Occupational Therapy Session Note  Patient Details  Name: Gilbert [name] MRN: [id number] Date of Birth: 10/23/1950  Today's Date: 12/10/2013 Time: 1110-1205 Time Calculation (min): 55 min  Short Term Goals: Week 1:  OT Short Term Goal 1 (Week 1): STG=LTG due to estimated SLOS  Skilled Therapeutic Interventions/Progress Updates:  Patient resting in recliner upon arrival asking, "I don't have to go to the gym again do I?"  Engaged in review of rehab program/process and OT goals to include discussion/collaboration.  Patient feels he does not need to be here and wants to go home as soon as possible.  Reviewed that the rehab team also want him to be able to go home as soon as possible as well and he will be released as soon as he demonstrates that he can be safe with all mobility and make safe decisions so he can go home with only intermittent supervision.  Engaged in functional mobility with RW in his room to include walker safety and choosing to steady himself with one arm on a stable surface before reaching outside of his BOS or toward the floor.  Also completed toilet transfer, travelled by w/c to therapy bathroom with tub shower combo to practice climbing in/out with use of grab bars and use of shower chair for energy conservation and safety initially.  Patient reluctantly agreed and reported that his "boss man" has probably already purchased the shower chair and grab bars.  Therapy Documentation Precautions:  Precautions Precautions: Fall Precaution Comments: significant Chest pain with activity, likely due to chest compressions during CPR Restrictions Weight Bearing Restrictions: No Pain: Denies pain during session. See FIM for current functional status  Therapy/Group: Individual Therapy  Gaye Pollack 12/10/2013, 2:05 PM

## 2013-12-10 NOTE — Progress Notes (Signed)
Speech Language Pathology Daily Session Note  Patient Details  Name: Gilbert [name] MRN: [id number] Date of Birth: Sep 26, 1950  Today's Date: 12/10/2013 Time: 3825-0539 Time Calculation (min): 30 min  Short Term Goals: Week 1: SLP Short Term Goal 1 (Week 1): Pt will utilize external memory aids to recall  new, daily information with supervision multimodal cues.  SLP Short Term Goal 2 (Week 1): Pt will demonstrate functional problem solving for complex and familiar tasks with Mod I.  SLP Short Term Goal 3 (Week 1): Pt will utilize word-finding strategies at the conversation level with supervision question cues.   Skilled Therapeutic Interventions: Skilled treatment focused on cognitive goals. SLP facilitated session with Max encouragement for participation, as pt was adamant that he was at his baseline level of function. SLP provided Total A for recall of therapy goals determined by pt and evaluating SLP on Saturday. Pt was ultimately agreeable to therapy, creating a list of current medications. Pt required Mod-Max cues for recall of medications and their functions. He required Min cues for word-finding throughout conversation. Continue plan of care.   FIM:  Comprehension Comprehension Mode: Auditory Comprehension: 5-Follows basic conversation/direction: With extra time/assistive device Expression Expression: 4-Expresses basic 75 - 89% of the time/requires cueing 10 - 24% of the time. Needs helper to occlude trach/needs to repeat words. Social Interaction Social Interaction: 5-Interacts appropriately 90% of the time - Needs monitoring or encouragement for participation or interaction. Problem Solving Problem Solving: 5-Solves basic 90% of the time/requires cueing < 10% of the time Memory Memory: 2-Recognizes or recalls 25 - 49% of the time/requires cueing 51 - 75% of the time  Pain Pain Assessment Pain Assessment: No/denies pain Pain Score: 0-No pain  Therapy/Group: Individual  Therapy   Germain Osgood, M.A. CCC-SLP [phone]  Germain Osgood 12/10/2013, 12:21 PM

## 2013-12-10 NOTE — Progress Notes (Signed)
[facility] PHYSICAL MEDICINE & REHABILITATION     PROGRESS NOTE    Subjective/Complaints: No new issues. Still with chest wall pain but "that is to be expected". Slept well. Denies sob, cough  Objective: Vital Signs: Blood pressure 124/77, pulse 74, temperature 97.5 F (36.4 C), temperature source Oral, resp. rate 19, weight 81.9 kg (180 lb 8.9 oz), SpO2 95.00%. No results found.  Recent Labs  12/10/13 0651  WBC 7.4  HGB 10.4*  HCT 31.6*  PLT 212    Recent Labs  12/10/13 0651  NA 140  K 4.4  CL 104  GLUCOSE 89  BUN 15  CREATININE 1.18  CALCIUM 8.9   CBG (last 3)  No results found for this basename: GLUCAP,  in the last 72 hours  Wt Readings from Last 3 Encounters:  12/08/13 81.9 kg (180 lb 8.9 oz)  12/01/13 90.7 kg (199 lb 15.3 oz)  12/01/13 90.7 kg (199 lb 15.3 oz)    Physical Exam:  Constitutional: He is oriented to person, place, and time. He appears well-developed and well-nourished.  HENT: oral mucosa pink and moist  Head: Normocephalic and atraumatic.  Eyes: Conjunctivae are normal. PERRL  Neck: Normal range of motion. Neck supple.  Cardiovascular: Normal rate and regular rhythm. No rubs/galops  No murmur heard. Life vest in place  Respiratory:clear to auscultation without wheezes, rales, rhonchi  GI: Soft. Bowel sounds are normal. He exhibits no distension. Non-tender Musculoskeletal: He exhibits no edema. Has pain over manubrium and right lower rib border BLE warm to touch. No cyanosis. No pain with ROM.  Neurological: He is alert and oriented to person, place, and time.  Speech clear. Some mild deficits with recall. Follows basic commands without difficulty. No impulsivity or irritability today. motor strength is 4 to 4+/5 bilateral deltoid, bicep, tricep, grip, hip flexors, knee extensors, ankle dorsiflexion plantar flexor  Sensation intact to light touch in bilateral upper and lower limbs.  Psych: appropriate at the conversation  level   Assessment/Plan: 1. 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 Physiatrist is providing close team supervision and 24 hour management of active medical problems listed below. Physiatrist and rehab team continue to assess barriers to discharge/monitor patient progress toward functional and medical goals. FIM: FIM - Bathing Bathing: 0: Activity did not occur  FIM - Upper Body Dressing/Undressing Upper body dressing/undressing steps patient completed: Thread/unthread right sleeve of pullover shirt/dresss;Thread/unthread left sleeve of pullover shirt/dress;Put head through opening of pull over shirt/dress;Pull shirt over trunk Upper body dressing/undressing: 5: Set-up assist to: Obtain clothing/put away FIM - Lower Body Dressing/Undressing Lower body dressing/undressing steps patient completed: Pull underwear up/down;Pull pants up/down Lower body dressing/undressing: 3: Mod-Patient completed 50-74% of tasks  FIM - Toileting Toileting steps completed by patient: Adjust clothing prior to toileting;Performs perineal hygiene;Adjust clothing after toileting Toileting: 5: Supervision: Safety issues/verbal cues  FIM - Radio producer Devices: Bedside commode Toilet Transfers: 4-To toilet/BSC: Min A (steadying Pt. > 75%);4-From toilet/BSC: Min A (steadying Pt. > 75%)  FIM - Control and instrumentation engineer Devices: Walker;Arm rests Bed/Chair Transfer: 4: Supine > Sit: Min A (steadying Pt. > 75%/lift 1 leg);4: Sit > Supine: Min A (steadying pt. > 75%/lift 1 leg);4: Bed > Chair or W/C: Min A (steadying Pt. > 75%);4: Chair or W/C > Bed: Min A (steadying Pt. > 75%)  FIM - Locomotion: Wheelchair Distance: 150 Locomotion: Wheelchair: 5:  Travels 150 ft or more: maneuvers on rugs and over door sills with supervision, cueing or  coaxing FIM - Locomotion: Ambulation Locomotion: Ambulation Assistive Devices: Walker - Rolling Ambulation/Gait Assistance: 4: Min guard;5: Supervision Locomotion: Ambulation: 2: Travels 50 - 149 ft with minimal assistance (Pt.>75%)  Comprehension Comprehension Mode: Auditory Comprehension: 5-Understands basic 90% of the time/requires cueing < 10% of the time  Expression Expression Mode: Verbal Expression: 4-Expresses basic 75 - 89% of the time/requires cueing 10 - 24% of the time. Needs helper to occlude trach/needs to repeat words.  Social Interaction Social Interaction: 6-Interacts appropriately with others with medication or extra time (anti-anxiety, antidepressant).  Problem Solving Problem Solving: 5-Solves basic 90% of the time/requires cueing < 10% of the time  Memory Memory: 3-Recognizes or recalls 50 - 74% of the time/requires cueing 25 - 49% of the time  Medical Problem List and Plan:  1. Functional deficits secondary to deconditioning following cardiac arrest with hypoxic encephalopathy. Patient with life vest  2. DVT Prophylaxis/Anticoagulation: SCDs. Patient is ambulatory  3. Pain Management: Tylenol as needed. Added lidoderm patches for sternal/rib/manubrial pain which have been helpful 4. Neuropsych: This patient is capable of making decisions on his own behalf.  -cognition appears to be improving  5. Hypertension. Coreg 12.5 mg twice a day, lisinopril 5 mg daily. Monitor with increased mobility  6. History of seizure disorder. Dilantin 300 mg each bedtime. Monitor for any signs of seizure activity  7. Acute renal failure. Admission creatinine 1.82 since improved to 1.14. Followup chemistries    LOS (Days) 3 A FACE TO FACE EVALUATION WAS PERFORMED  Meredith Staggers 12/10/2013 8:49 AM

## 2013-12-10 NOTE — IPOC Note (Signed)
Overall Plan of Care [facility]) Patient Details Name: Gilbert [name] MRN: [id number] DOB: April 24, 1951  Admitting Diagnosis: Deconditioning   Hospital Problems: Active Problems:   Encephalopathy acute     Functional Problem List: Nursing Edema;Endurance;Medication Management;Pain;Safety;Skin Integrity  PT Balance;Endurance;Motor;Safety;Pain  OT Balance;Cognition;Endurance;Motor;Pain;Safety  SLP Cognition  TR         Basic ADL's: OT Grooming;Bathing;Dressing;Toileting     Advanced  ADL's: OT Simple Meal Preparation     Transfers: PT Bed Mobility;Bed to Chair;Car;Furniture;Floor  OT Toilet;Tub/Shower     Locomotion: PT Ambulation;Wheelchair Mobility;Stairs     Additional Impairments: OT None  SLP Communication;Social Cognition expression Problem Solving;Memory  TR      Anticipated Outcomes Item Anticipated Outcome  Self Feeding independent  Swallowing  N/A   Basic self-care  mod I   Toileting  mod I   Bathroom Transfers mod I with toilet;  supervision with shower  Bowel/Bladder  Patient will be independent and continent of bowel and blader  Transfers  mod I   Locomotion  overall supervision/mod I  Communication  Supervision  Cognition  Supervision-Mod I   Pain  Pain will be equal to or less han 3 on scale of 0-10 with min assist  Safety/Judgment  Patient will be free from falls/injury with min assist   Therapy Plan: PT Intensity: Minimum of 1-2 x/day ,45 to 90 minutes PT Frequency: 5 out of 7 days PT Duration Estimated Length of Stay: 7-10 days OT Intensity: Minimum of 1-2 x/day, 45 to 90 minutes OT Frequency: 5 out of 7 days OT Duration/Estimated Length of Stay: 7-9 days SLP Intensity: Minumum of 1-2 x/day, 30 to 90 minutes SLP Frequency: 5 out of 7 days SLP Duration/Estimated Length of Stay: 7-10 days        Team Interventions: Nursing Interventions Patient/Family Education;Disease Management/Prevention;Pain Management;Medication Management;Skin  Care/Wound Management;Discharge Planning  PT interventions Ambulation/gait training;Balance/vestibular training;Discharge planning;DME/adaptive equipment instruction;Functional mobility training;Neuromuscular re-education;Pain management;Patient/family education;Stair training;Therapeutic Exercise;Therapeutic Activities;UE/LE Strength taining/ROM;UE/LE Coordination activities  OT Interventions Balance/vestibular training;Discharge planning;DME/adaptive equipment instruction;Functional mobility training;Neuromuscular re-education;Pain management;Self Care/advanced ADL retraining;Therapeutic Activities;Therapeutic Exercise;UE/LE Strength taining/ROM;Visual/perceptual remediation/compensation;Community reintegration  SLP Interventions Cognitive remediation/compensation;Cueing hierarchy;Functional tasks;Environmental controls;Internal/external aids;Speech/Language facilitation;Patient/family education;Therapeutic Activities  TR Interventions    SW/CM Interventions Discharge Planning;Psychosocial Support;Patient/Family Education    Team Discharge Planning: Destination: PT-Home ,OT- Home , SLP-Home Projected Follow-up: PT-Outpatient PT, OT-  Home health OT, SLP-Outpatient SLP (TBD, [place] need intermittent supervision ) Projected Equipment Needs: PT-Rolling walker with 5" wheels, OT- Tub/shower bench, SLP-None recommended by SLP Equipment Details: PT- , OT-  Patient/family involved in discharge planning: PT- Patient;Family member/caregiver,  OT-Patient, SLP-Patient  MD ELOS: 5 days Medical Rehab Prognosis:  Excellent Assessment: The patient has been admitted for CIR therapies with the diagnosis of anoxic brain injury. The team will be addressing functional mobility, strength, stamina, balance, safety, adaptive techniques and equipment, self-care, bowel and bladder mgt, patient and caregiver education, NMR, cognitive perceptual rx, memory, CV tolerance. Goals have been set at mod I to supervision for  mobility, self-care, and cognition.    Meredith Staggers, MD, FAAPMR      See Team Conference Notes for weekly updates to the plan of care

## 2013-12-10 NOTE — Progress Notes (Signed)
Occupational Therapy Session Note  Patient Details  Name: Gilbert [name] MRN: [id number] Date of Birth: 1951/02/20  Today's Date: 12/10/2013 Time: 6244-6950 Time Calculation (min): 30 min  Skilled Therapeutic Interventions/Progress Updates:    Pt seen for individual OT treatment session this am with focus on ADL retraining, functional transfers, activity tolerance and tub transfers in ADL bathroom using tub bench. Pt performed bathing and dressing at sink level today, he required occasional vc's for safety, sequencing and initiation during these tasks, initially stating "Can you do that?" re: changing shirt, washing UB etc. Pt was supervision - min guard A standing at sink for LB bathing/dressing again w/ vc's for safety. Pt propelled w/c to ADL bathroom and used RW in bathroom w/ supervision-min A for tub transfer using tub bench. Discussed home set up and DME needs w/ pt. Pt ambulated back to his room using RW after therapy session given supervision-min guard assist. He was left sitting in his recliner chair w/ call bell and phone in reach at conclusion of session.  Therapy Documentation Precautions:  Precautions Precautions: Fall Precaution Comments: significant Chest pain with activity, likely due to chest compressions during CPR Restrictions Weight Bearing Restrictions: No     Pain: Pain Assessment Pain Assessment: No/denies pain Pain Score: 0-No pain       See FIM for current functional status  Therapy/Group: Individual Therapy  [name] B Cincere Zorn 12/10/2013, 12:51 PM

## 2013-12-10 NOTE — Progress Notes (Signed)
Social Work Assessment and Plan Social Work Assessment and Plan  Patient Details  Name: Gilbert [name] MRN: [id number] Date of Birth: 02-04-1951  Today's Date: 12/10/2013  Problem List:  Patient Active Problem List   Diagnosis Date Noted  . Encephalopathy acute 12/07/2013  . Cardiac arrest 11/27/2013  . Seizures 11/27/2013  . HTN (hypertension) 11/27/2013  . Acute respiratory failure with hypoxia 11/27/2013  . Shock circulatory 11/27/2013  . Hypomagnesemia 11/27/2013  . Renal cancer 10/19/2013  . Renal mass, right 10/10/2013  . Coronary artery calcification 09/25/2013  . Renal cyst 08/30/2013  . Loss of weight 08/03/2013   Past Medical History:  Past Medical History  Diagnosis Date  . Gout   . GERD (gastroesophageal reflux disease)   . Hypertension   . Hypercholesteremia   . Cancer     right kidney cancer  . Shortness of breath     with activity  . Epilepsy     at least 10 years ago   . Family history of anesthesia complication     mother-had stroke during anesthesia  . HTN (hypertension)   . Seizures   . Cardiac arrest   . Cancer    Past Surgical History:  Past Surgical History  Procedure Laterality Date  . No past surgeries    . Eye surgery Left 2 weeks ago    torn retina  . Robot assisted laparoscopic nephrectomy Right 10/19/2013    Procedure: ROBOTIC ASSISTED LAPAROSCOPIC NEPHRECTOMY,  EXTENSIVE ADHESIOLYSIS;  Surgeon: Alexis Frock, MD;  Location: [facility];  Service: Urology;  Laterality: Right;   Social History:  reports that he has never smoked. His smokeless tobacco use includes Chew. He reports that he does not drink alcohol or use illicit drugs.  Family / Support Systems Marital Status: Divorced Patient Roles:  (role as Grandpa as he lives with dtr and 2 grandsons (ages 72) Children: daughter, Leafy Ro @ (H) [phone] or (C) [phone];  daughter, Janett Billow @ [phone] (lives with pt) Other Supports: sister, Larena Glassman (former Therapist, sports in [employer]) lives close by and  willing to check on him regularly Anticipated Caregiver: dtr Janett Billow Ability/Limitations of Caregiver: dtr Janett Billow does have a fluctuating work schedule and is there majority of time, but not 24-7 (pt does have 5 siblings that [place] offer support as well.) Caregiver Availability: Intermittent (depends on dtr's work schedule) Family Dynamics: pt describes all of his family as very supportive.  Has two sisters who live locally and are retired who "will check on me"  Social History Preferred language: English Religion: Christian Cultural Background: NA Education: HS Read: Yes Write: Yes Employment Status: Unemployed Date Retired/Disabled/Unemployed: Oct. 2014 due to kidney [place] Legal Hisotry/Current Legal Issues: None -  Guardian/Conservator: None - per MD, pt capable of making decisions on his own behalf   Abuse/Neglect Physical Abuse: Denies Verbal Abuse: Denies Sexual Abuse: Denies Exploitation of patient/patient's resources: Denies Self-Neglect: Denies  Emotional Status Pt's affect, behavior adn adjustment status: Pt pleasant, oriented and denies any emotional distress.  Very focused on wanting to go home and feels he is ready for this.   Recent Psychosocial Issues: Recent treatment this past year for kidney CA Pyschiatric History: None Substance Abuse History: None  Patient / Family Perceptions, Expectations & Goals Pt/Family understanding of illness & functional limitations: pt and family with basic understanding of his acute medical, cardiac issues and current deconditioned state/ need for CIR. 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  I don't need anybody to stay with me."  [employer]: None Premorbid Home  Care/DME Agencies: None Transportation available at discharge: yes  Discharge Planning Living Arrangements: [place]: Children;Other relatives Type of Residence: Private residence Insurance Resources: Multimedia programmer (specify) (BCBS of [place]) Financial Resources: Employment;Social Security Financial Screen Referred: No Living Expenses: Own Money Management: Patient Does the patient have any problems obtaining your medications?: No Home Management: pt Patient/Family Preliminary Plans: Pt to return home with family providing intermittent assistance Social Work Anticipated Follow Up Needs: HH/OP Expected length of stay: 4 days  Clinical Impression Pleasant gentleman who, per tx report, is reaching a mod i level and ready for d/c tomorrow.  Conducted a very brief assessment and assisted with f/u [place] referral.  No emotional distress noted. Pt agreeable with d/c tomorrow.  Lennart Pall 12/10/2013, 2:59 PM

## 2013-12-11 ENCOUNTER — Inpatient Hospital Stay (HOSPITAL_COMMUNITY): Payer: BC Managed Care – PPO | Admitting: Occupational Therapy

## 2013-12-11 ENCOUNTER — Inpatient Hospital Stay (HOSPITAL_COMMUNITY): Payer: Self-pay | Admitting: Physical Therapy

## 2013-12-11 DIAGNOSIS — J9600 Acute respiratory failure, unspecified whether with hypoxia or hypercapnia: Secondary | ICD-10-CM

## 2013-12-11 DIAGNOSIS — I2510 Atherosclerotic heart disease of native coronary artery without angina pectoris: Secondary | ICD-10-CM

## 2013-12-11 DIAGNOSIS — R579 Shock, unspecified: Secondary | ICD-10-CM

## 2013-12-11 DIAGNOSIS — G931 Anoxic brain damage, not elsewhere classified: Secondary | ICD-10-CM

## 2013-12-11 MED ORDER — LISINOPRIL 5 MG PO TABS: 5.0000 mg | ORAL_TABLET | Freq: Every day | ORAL | Status: DC

## 2013-12-11 MED ORDER — ATORVASTATIN CALCIUM 10 MG PO TABS
10.0000 mg | ORAL_TABLET | Freq: Every day | ORAL | Status: DC
Start: 2013-12-11 — End: 2014-05-15

## 2013-12-11 MED ORDER — LANSOPRAZOLE 30 MG PO CPDR: 30.0000 mg | DELAYED_RELEASE_CAPSULE | Freq: Every day | ORAL | Status: DC

## 2013-12-11 MED ORDER — CARVEDILOL 12.5 MG PO TABS: 12.5000 mg | ORAL_TABLET | Freq: Two times a day (BID) | ORAL | Status: DC

## 2013-12-11 MED ORDER — ASPIRIN EC 81 MG PO TBEC: 81.0000 mg | DELAYED_RELEASE_TABLET | Freq: Every day | ORAL | Status: DC

## 2013-12-11 MED ORDER — LIDOCAINE 5 % EX PTCH: 1.0000 | MEDICATED_PATCH | CUTANEOUS | Status: DC

## 2013-12-11 MED ORDER — PHENYTOIN SODIUM EXTENDED 100 MG PO CAPS: 300.0000 mg | ORAL_CAPSULE | Freq: Every day | ORAL | Status: DC

## 2013-12-11 MED ORDER — PRASUGREL HCL 10 MG PO TABS: 10.0000 mg | ORAL_TABLET | Freq: Every day | ORAL | Status: DC

## 2013-12-11 NOTE — Patient Care Conference (Signed)
Inpatient RehabilitationTeam Conference and Plan of Care Update Date: 12/11/2013   Time: 1:34 PM    Patient Name: Gilbert [name]      Medical Record Number: [id number]  Date of Birth: 08/16/1950 Sex: Male         Room/Bed: 4W24C/4W24C-01 Payor Info: Payor: [place] / Plan: [employer]BS [place] PPO / Product Type: *No Product type* /    Admitting Diagnosis: Deconditioning   Admit Date/Time:  12/07/2013  1:56 PM Admission Comments: No comment available   Primary Diagnosis:  <principal problem not specified> Principal Problem: <principal problem not specified>  Patient Active Problem List   Diagnosis Date Noted  . Encephalopathy acute 12/07/2013  . Cardiac arrest 11/27/2013  . Seizures 11/27/2013  . HTN (hypertension) 11/27/2013  . Acute respiratory failure with hypoxia 11/27/2013  . Shock circulatory 11/27/2013  . Hypomagnesemia 11/27/2013  . Renal cancer 10/19/2013  . Renal mass, right 10/10/2013  . Coronary artery calcification 09/25/2013  . Renal cyst 08/30/2013  . Loss of weight 08/03/2013    Expected Discharge Date: Expected Discharge Date: 12/11/13  Team Members Present: Physician leading conference: Dr. Alger Simons Social Worker Present: Lennart Pall, LCSW Nurse Present: Elliot Cousin, RN PT Present: Isabelle Course, PT OT Present: Blanchard Mane, OT SLP Present: Weston Anna, SLP     Current Status/Progress Goal Weekly Team Focus  Medical   anoxic bi, deconditioning. nice functional gains, memory and behavior improving  stabilize medically for functional gains and discharge,   managing cv issues, bp control, safety, pain mgt   Bowel/Bladder   continent of  bowel and bladder, independent  continent of bowel and bladder  encourage independent toileting   Swallow/Nutrition/ Hydration             ADL's   mod I  mod I  d/c planning   Mobility   mod I  mod I  d/c planning   Communication   Mod I  Supervision for word-finding strategies   D/C home today    Safety/Cognition/ Behavioral Observations  Mod I-Supervision   Mod I-Supervision   D/C home today    Pain   no pain reported by atient  pain level of 3 or less on a scale of 0-10  assess for pain q4hr   Skin   no skin breakdown noted  no skin breakdown  encourage turning in bed    Rehab Goals Patient on target to meet rehab goals: Yes *See Care Plan and progress notes for long and short-term goals.  Barriers to Discharge: safety    Possible Resolutions to Barriers:  supervision at home, pt ed    Discharge Planning/Teaching Needs:  home with intermittent assistance from daughter/ family      Team Discussion:  Has made good gains and reaching mod i goals.  Intermittent assist from family. Ready for d/c  Revisions to Treatment Plan:  None   Continued Need for Acute Rehabilitation Level of Care: The patient requires daily medical management by a physician with specialized training in physical medicine and rehabilitation for the following conditions: Daily direction of a multidisciplinary physical rehabilitation program to ensure safe treatment while eliciting the highest outcome that is of practical value to the patient.: Yes Daily medical management of patient stability for increased activity during participation in an intensive rehabilitation regime.: Yes Daily analysis of laboratory values and/or radiology reports with any subsequent need for medication adjustment of medical intervention for : Cardiac problems;Neurological problems  Lennart Pall 12/12/2013, 3:16 PM

## 2013-12-11 NOTE — Progress Notes (Signed)
Pt discharged to home with his daughter

## 2013-12-11 NOTE — Progress Notes (Signed)
Physical Therapy Discharge Summary  Patient Details  Name: Gilbert [name] MRN: [id number] Date of Birth: 1950-12-12  Today's Date: 12/11/2013  Patient has met 8 of 8 long term goals due to improved activity tolerance, improved balance and increased strength.  Patient to discharge at an ambulatory level Modified Independent.     Reasons goals not met: n/a  Recommendation:  Patient will benefit from ongoing skilled PT services in home health setting to continue to advance safe functional mobility, address ongoing impairments in activity tolerance, balance, and minimize fall risk.  Equipment: RW  Reasons for discharge: treatment goals met and discharge from hospital  Patient/family agrees with progress made and goals achieved: Yes  PT Discharge  Cognition Overall Cognitive Status: Within Functional Limits for tasks assessed Sensation Sensation Light Touch: Appears Intact Proprioception: Appears Intact Coordination Gross Motor Movements are Fluid and Coordinated: Yes Fine Motor Movements are Fluid and Coordinated: Yes Motor  Motor Motor: Within Functional Limits   Trunk/Postural Assessment  Cervical Assessment Cervical Assessment: Within Functional Limits Thoracic Assessment Thoracic Assessment: Within Functional Limits Lumbar Assessment Lumbar Assessment: Within Functional Limits Postural Control Postural Control: Within Functional Limits  Balance Static Standing Balance Static Standing - Level of Assistance: 6: Modified independent (Device/Increase time) Dynamic Standing Balance Dynamic Standing - Level of Assistance: 6: Modified independent (Device/Increase time) Berg 49/56 Extremity Assessment      RLE Assessment RLE Assessment: Within Functional Limits LLE Assessment LLE Assessment: Within Functional Limits  See FIM for current functional status  Kennith Gain 12/11/2013, 7:42 AM

## 2013-12-11 NOTE — Progress Notes (Addendum)
Occupational Therapy Discharge Summary  Patient Details  Name: Gilbert [name] MRN: [id number] Date of Birth: 03-09-1951  Today's Date: 12/11/2013 Time: 1100-1200 Time Calculation (min): 60 min  Skilled Intervention: Self care retraining to include shower, dress, and groom.  Focused session on patient demonstrating Mod I for these tasks to include walker safety with functional mobility and ability to doff and donn Life Vest correctly as well as supervision with safe shower transfers.  Patient reports feeling great about getting to go home today and feels like he is ready to do so.  Patient has met 10 of 10 long term goals due to improved activity tolerance, improved balance, postural control, ability to compensate for deficits, improved awareness and memory.  Patient to discharge at overall Modified Independent level for BADL tasks and snack and beverage prep (supervision for any type cooking initally).  Patient's care partner unavailable to provide the necessary supervision assistance at discharge with tub/shower transfers.  Spoke with one of patient's daughters on the phone on the morning of discharge to review that due to mild cognitive deficits related to executive functions, recommend that initially-family/friends need go by to check in on him at least 2-3 times/day as well as occasional phone calls.  She states that this might not be possible however she is trying to get family/friends to commit to assisting.  Reasons goals not met: n/a secondary to all goals met.  Recommendation:  No follow up OT recommended at this time  Equipment: patient to obtain a shower chair.  Reasons for discharge: treatment goals met and discharge from hospital  Patient/family agrees with progress made and goals achieved: Yes  OT Discharge Pain No report of pain ADL Overall Mod I and Supervision for shower transfer Cognition Overall Cognitive Status: Within Functional Limits for tasks  assessed Arousal/Alertness: Awake/alert Orientation Level: Oriented X4 Comments: Patient able to independently recall then return demonstrate steps to doff and donn his Life Saver Vest to include remove then replace battery.  Patient reports that he replaces battery every morning with a charged battery which he completed eariler in the am. Sensation Sensation Light Touch: Appears Intact Proprioception: Appears Intact Coordination Gross Motor Movements are Fluid and Coordinated: Yes Fine Motor Movements are Fluid and Coordinated: Yes Motor  Motor Motor: Within Functional Limits Mobility  Mod I with RW for simple mobility Trunk/Postural Assessment  Cervical Assessment Cervical Assessment: Within Functional Limits Thoracic Assessment Thoracic Assessment: Within Functional Limits Lumbar Assessment Lumbar Assessment: Within Functional Limits (posterior pelvic tilt with all mobility) Postural Control Postural Control: Within Functional Limits  Balance Static Standing Balance Static Standing - Level of Assistance: 6: Modified independent (Device/Increase time) Dynamic Standing Balance Dynamic Standing - Level of Assistance: 6: Modified independent (Device/Increase time) Extremity/Trunk Assessment WFL BUEs See FIM for current functional status  ADDENDUM:  Skilled Intervention added above  Gaye Pollack 12/11/2013, 5:17 PM

## 2013-12-11 NOTE — Discharge Instructions (Signed)
Inpatient Rehab Discharge Instructions  Gilbert [name] Discharge date and time: No discharge date for patient encounter.   Activities/Precautions/ Functional Status: Activity: activity as tolerated Diet: cardiac diet Wound Care: none needed Functional status:  ___ No restrictions     ___ Walk up steps independently ___ 24/7 supervision/assistance   ___ Walk up steps with assistance ___ Intermittent supervision/assistance  ___ Bathe/dress independently ___ Walk with walker     ___ Bathe/dress with assistance ___ Walk Independently    ___ Shower independently _x__ Walk with assistance    ___ Shower with assistance ___ No alcohol     ___ Return to work/school ________    COMMUNITY REFERRALS UPON DISCHARGE:    Home Health:   PT       RN                    Agency: [facility] Phone: [phone]   Medical Equipment/Items Ordered: rolling walker                                                    Agency/Supplier: [facility] [phone]        COMMUNITY REFERRALS UPON DISCHARGE:    Home Health:   PT       RN                       Agency: [facility] Phone: [phone]   Medical Equipment/Items Ordered: rolling walker via [employer]                                 Special Instructions: Continued life vest as directed. Do not pick up Brilinta. It was changed to Effient tablets for stents.    My questions have been answered and I understand these instructions. I will adhere to these goals and the provided educational materials after my discharge from the hospital.  Patient/Caregiver Signature _______________________________ Date __________  Clinician Signature _______________________________________ Date __________  Please bring this form and your medication list with you to all your follow-up doctor's appointments.

## 2013-12-11 NOTE — Progress Notes (Signed)
Speech Language Pathology Discharge Summary  Patient Details  Name: Gilbert [name] MRN: [id number] Date of Birth: 12/27/1950  Today's Date: 12/11/2013  Patient has met 3 of 3 long term goals.  Patient to discharge at overall Supervision;Modified Independent level.   Reasons goals not met: N/A   Clinical Impression/Discharge Summary: Pt had made functional gains and has met 3 of 3 LTG's this admission due to increased word-finding, working memory and problem solving. Pt will discharge at an overall supervision-Mod I level. Pt will discharge home with intermittent supervision from family and does not need SLP f/u at this time due to patient reports he is at baseline level of functioning.   Care Partner:  Caregiver Able to Provide Assistance: Yes  Type of Caregiver Assistance: Cognitive  Recommendation:  None      Equipment: N/A   Reasons for discharge: Treatment goals met;Discharged from hospital   Patient/Family Agrees with Progress Made and Goals Achieved: Yes   See FIM for current functional status  Buzzy Han 12/11/2013, 2:38 PM

## 2013-12-11 NOTE — Discharge Summary (Signed)
NAMEMarland Kitchen  Gilbert [name], Gilbert [name] NO.:  [id number]  MEDICAL RECORD NO.:  [id number]  LOCATION:  [id number]                        FACILITY:  [facility]  PHYSICIAN:  Gilbert [name], M.D.DATE OF BIRTH:  Aug 02, 1951  DATE OF ADMISSION:  12/07/2013 DATE OF DISCHARGE:  12/11/2013                              DISCHARGE SUMMARY   DISCHARGE DIAGNOSES: 1. Functional deficits secondary to deconditioning following cardiac     arrest with hypoxic encephalopathy. 2. Sequential compression devices for DVT prophylaxis. 3. Hypertension. 4. History of seizure disorder. 5. Acute renal failure, improving.  HISTORY OF PRESENT ILLNESS:  This is a 63 year old right-handed male with history of seizures as well as hypertension with recent nephrectomy for cancer who was found unresponsive in his chair November 27, 2013.  Down time was unknown.  When the EMS came after activation from family members, the patient found to be in VFib with ventricular fibrillation. He was defibrillated.  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 Cardiogenic shock treated with pressors and aspiration pneumonia with pneumonitis treated with intravenous antibiotics.  He underwent cardiac catheterization with PTCA and stenting of left main and proximal LAD and placement of Impella CP device to help with cardiac output as well as a LifeVest by Dr. Einar Gip.  The patient with left lower extremity coolness post Impella removal and ABI with mild-to-moderate plaque in left FA with moderate to severe reduction in arterial flow.  Extubated [place] 1, 2015, therapies initiated.  Noted mild elevation in creatinine of 1.82 improved to 1.05.  The patient was admitted for comprehensive rehab program.  PAST MEDICAL HISTORY:  See discharge  diagnoses.  SOCIAL HISTORY:  Lives with family.  FUNCTIONAL HISTORY PRIOR TO ADMISSION:  independent.  FUNCTIONAL STATUS UPON ADMISSION TO REHAB SERVICES:  Ambulating min assist 150 feet rolling walker;  minimal assist stand to sit, max assist sit to stand; min to mod assist activities of daily living.  PHYSICAL EXAMINATION:  VITAL SIGNS:  Blood pressure 129/73, pulse 91, temperature 97.9, respirations 17. GENERAL:  This was an alert male, oriented to person, place, and time. He appeared well developed. HEENT:  Pupils were round and reactive to light.  Speech was clear, some problems with recall of recent incident. LUNGS:  Clear to auscultation. CARDIAC:  Regular rate and rhythm. ABDOMEN:  Soft, nontender.  Good bowel sounds.  REHABILITATION HOSPITAL COURSE:  The patient was admitted to Inpatient Rehab Services with therapies initiated on a 3-hour daily basis consisting of physical therapy, occupational therapy, speech therapy, and rehabilitation nursing.  The following issues were addressed during the patient's rehabilitation stay.  Pertaining to [name] functional deficits secondary to cardiac arrest, hypoxic encephalopathy remained stable.  He continued to participate with excellent overall gains.  A LifeVest had been placed by Cardiology Services.  Sequential compression devices for DVT prophylaxis.  Blood pressures controlled on Coreg as well as lisinopril.  He remained on Dilantin prior to admission for history of seizure disorder.  No seizure activity noted.  Acute renal failure.  Creatinine continued to improve  1.18 and monitored.  The patient received weekly collaborative interdisciplinary team conferences to discuss estimated length of stay, family teaching, and any barriers to discharge.  He was ambulating 200 feet rolling walker, controlled environment.  No loss of balance and dizziness.  Performed a gait and home environment supervision with a rolling walker.  Stair  negotiation with bilateral hand rails supervision with 10 steps.  He was able to dress himself for activities of daily living, gather his belongings, perform bathing and dressing in sink side.  He sometime needed encouragement to participate with his therapies.  Needed assist for some recall of therapy goals.  Full family teaching was completed.  Plan was to be discharged to home.  DISCHARGE MEDICATIONS: 1. Aspirin 81 mg p.o. daily. 2. Lipitor 10 mg p.o. daily. 3. Coreg 12.5 mg p.o. b.i.d. 4. Lidoderm patch change every 12 hours. 5. Lisinopril 5 mg p.o. daily. 6. Protonix 40 mg p.o. daily. 7. Dilantin 300 mg p.o. at bedtime. 8. Effient 10 mg daily  DIET:  Heart healthy.  SPECIAL INSTRUCTIONS:  The patient would follow up Dr. Alger Simons at the Outpatient Rehab Service office as directed, Dr. Einar Gip call for appointment, as well as Dr. [name] medical management.  Ongoing therapies were dictated as per [employer].  Special instructions, continue [facility]   ______________________________ Gilbert [name], M.D.    DA/MEDQ  D:  12/10/2013  T:  12/11/2013  Job:  [id number]  cc:   [name], MD Lina Sayre Everlene Farrier, M.D.

## 2013-12-11 NOTE — Progress Notes (Signed)
[facility] PHYSICAL MEDICINE & REHABILITATION     PROGRESS NOTE    Subjective/Complaints: Ready to go home. Wants to know what he needs to do to get out.   Objective: Vital Signs: Blood pressure 119/68, pulse 86, temperature 98 F (36.7 C), temperature source Oral, resp. rate 18, weight 81.9 kg (180 lb 8.9 oz), SpO2 93.00%. No results found.  Recent Labs  12/10/13 0651  WBC 7.4  HGB 10.4*  HCT 31.6*  PLT 212    Recent Labs  12/10/13 0651  NA 140  K 4.4  CL 104  GLUCOSE 89  BUN 15  CREATININE 1.18  CALCIUM 8.9   CBG (last 3)  No results found for this basename: GLUCAP,  in the last 72 hours  Wt Readings from Last 3 Encounters:  12/08/13 81.9 kg (180 lb 8.9 oz)  12/01/13 90.7 kg (199 lb 15.3 oz)  12/01/13 90.7 kg (199 lb 15.3 oz)    Physical Exam:  Constitutional: He is oriented to person, place, and time. He appears well-developed and well-nourished.  HENT: oral mucosa pink and moist  Head: Normocephalic and atraumatic.  Eyes: Conjunctivae are normal. PERRL  Neck: Normal range of motion. Neck supple.  Cardiovascular: Normal rate and regular rhythm. No rubs/galops  No murmur heard. Life vest in place  Respiratory:clear to auscultation without wheezes, rales, rhonchi  GI: Soft. Bowel sounds are normal. He exhibits no distension. Non-tender Musculoskeletal: He exhibits no edema. Has pain over manubrium and right lower rib border BLE warm to touch. No cyanosis. No pain with ROM.  Neurological: He is alert and oriented to person, place, and time.  Speech clear. Some mild deficits with recall. Follows basic commands without difficulty. No impulsivity or irritability today. motor strength is 4 to 4+/5 bilateral deltoid, bicep, tricep, grip, hip flexors, knee extensors, ankle dorsiflexion plantar flexor  Sensation intact to light touch in bilateral upper and lower limbs.  Psych: appropriate at the conversation level   Assessment/Plan: 1. 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 Physiatrist is providing close team supervision and 24 hour management of active medical problems listed below. Physiatrist and rehab team continue to assess barriers to discharge/monitor patient progress toward functional and medical goals. FIM: FIM - Bathing Bathing: 0: Activity did not occur  FIM - Upper Body Dressing/Undressing Upper body dressing/undressing steps patient completed: Thread/unthread right sleeve of pullover shirt/dresss;Thread/unthread left sleeve of pullover shirt/dress;Put head through opening of pull over shirt/dress;Pull shirt over trunk Upper body dressing/undressing: 5: Set-up assist to: Obtain clothing/put away FIM - Lower Body Dressing/Undressing Lower body dressing/undressing steps patient completed: Thread/unthread right pants leg;Thread/unthread left pants leg;Pull pants up/down;Don/Doff right sock;Don/Doff left sock;Don/Doff right shoe;Don/Doff left shoe Lower body dressing/undressing: 4: Min-Patient completed 75 plus % of tasks  FIM - Toileting Toileting steps completed by patient: Adjust clothing prior to toileting;Performs perineal hygiene;Adjust clothing after toileting Toileting: 5: Supervision: Safety issues/verbal cues  FIM - Radio producer Devices: Bedside commode Toilet Transfers: 4-To toilet/BSC: Min A (steadying Pt. > 75%);4-From toilet/BSC: Min A (steadying Pt. > 75%)  FIM - Control and instrumentation engineer Devices: Walker;Arm rests Bed/Chair Transfer: 6: Chair or W/C > Bed: No assist;6: Bed > Chair or W/C: No assist;6: Assistive device: no helper  FIM - Locomotion: Wheelchair Distance: 150 Locomotion: Wheelchair: 0: Activity did not occur FIM - Locomotion: Ambulation Locomotion: Ambulation Assistive Devices: Administrator  Ambulation/Gait Assistance:  6: Modified independent (Device/Increase time) Locomotion: Ambulation: 6: Travels 150 ft or more with assistive device/no helper  Comprehension Comprehension Mode: Auditory Comprehension: 5-Follows basic conversation/direction: With extra time/assistive device  Expression Expression Mode: Verbal Expression: 4-Expresses basic 75 - 89% of the time/requires cueing 10 - 24% of the time. Needs helper to occlude trach/needs to repeat words.  Social Interaction Social Interaction: 5-Interacts appropriately 90% of the time - Needs monitoring or encouragement for participation or interaction.  Problem Solving Problem Solving: 5-Solves basic 90% of the time/requires cueing < 10% of the time  Memory Memory: 2-Recognizes or recalls 25 - 49% of the time/requires cueing 51 - 75% of the time  Medical Problem List and Plan:  1. Functional deficits secondary to deconditioning following cardiac arrest with hypoxic encephalopathy. Patient with life vest  2. DVT Prophylaxis/Anticoagulation: SCDs. Patient is ambulatory  3. Pain Management: Tylenol as needed. Added lidoderm patches for sternal/rib/manubrial pain which have been helpful 4. Neuropsych: This patient is capable of making decisions on his own behalf.  -cognition appears to be improving  5. Hypertension. Coreg 12.5 mg twice a day, lisinopril 5 mg daily. Monitor with increased mobility  6. History of seizure disorder. Dilantin 300 mg each bedtime. Monitor for any signs of seizure activity  7. Acute renal failure. Admission creatinine 1.82 since improved to 1.14. Followup chemistries  8. CV---changed from [place] to effient given interaction with phenytoin   LOS (Days) 4 A FACE TO FACE EVALUATION WAS PERFORMED  Meredith Staggers 12/11/2013 8:34 AM

## 2013-12-11 NOTE — Progress Notes (Signed)
Physical Therapy Note  Patient Details  Name: Gilbert [name] MRN: [id number] Date of Birth: Apr 10, 1951 Today's Date: 12/11/2013  Time: 730-800 30 minutes  1:1 No c/o pain, pt c/o soreness in calves with exercise, eases with rest.  Gait throughout unit and in room with mod I with RW, no LOB.  Car transfer performed with mod I.  Stair negotiation x 12 stairs with B handrails, step to pattern with mod I, pt with 1 LOB, able to self correct.  Reviewed HEP with handout and pt performed x 10 LAQ, mini squats, heel/toe raises, HS curls and hip abd.  Pt reports he feels ready and safe for d/c home today.   Danae Orleans Lilymarie Scroggins 12/11/2013, 8:07 AM

## 2013-12-26 ENCOUNTER — Telehealth: Payer: Self-pay

## 2013-12-26 NOTE — Telephone Encounter (Signed)
Vinnie Level RN @ [facility] is requesting a verbal order to extend home health care visits through the end of certification period which is July. Is this okay?

## 2013-12-26 NOTE — Telephone Encounter (Signed)
Yes, it's ok.

## 2013-12-27 NOTE — Telephone Encounter (Signed)
Attempted to contact [facility]. Left a message on a verified voicemail giving her a verbal okay to extend [facility] visits per Dr. Naaman Plummer.

## 2013-12-28 DIAGNOSIS — I10 Essential (primary) hypertension: Secondary | ICD-10-CM | POA: Diagnosis not present

## 2013-12-28 DIAGNOSIS — G40309 Generalized idiopathic epilepsy and epileptic syndromes, not intractable, without status epilepticus: Secondary | ICD-10-CM | POA: Diagnosis not present

## 2013-12-28 DIAGNOSIS — Z85528 Personal history of other malignant neoplasm of kidney: Secondary | ICD-10-CM | POA: Diagnosis not present

## 2013-12-28 DIAGNOSIS — I509 Heart failure, unspecified: Secondary | ICD-10-CM | POA: Diagnosis not present

## 2014-01-10 DIAGNOSIS — Z0271 Encounter for disability determination: Secondary | ICD-10-CM

## 2014-01-18 ENCOUNTER — Ambulatory Visit (HOSPITAL_BASED_OUTPATIENT_CLINIC_OR_DEPARTMENT_OTHER)
Admission: RE | Admit: 2014-01-18 | Discharge: 2014-01-18 | Disposition: A | Discharge status: Home / Self Care | Payer: BC Managed Care – PPO | Source: Ambulatory Visit | Attending: Hematology & Oncology | Admitting: Hematology & Oncology

## 2014-01-18 ENCOUNTER — Other Ambulatory Visit (HOSPITAL_BASED_OUTPATIENT_CLINIC_OR_DEPARTMENT_OTHER): Payer: BC Managed Care – PPO | Admitting: Lab

## 2014-01-18 ENCOUNTER — Other Ambulatory Visit: Payer: BC Managed Care – PPO | Admitting: Lab

## 2014-01-18 ENCOUNTER — Ambulatory Visit (HOSPITAL_BASED_OUTPATIENT_CLINIC_OR_DEPARTMENT_OTHER): Payer: BC Managed Care – PPO | Admitting: Hematology & Oncology

## 2014-01-18 VITALS — BP 136/70 | HR 74 | Temp 97.4°F | Resp 16

## 2014-01-18 DIAGNOSIS — Z905 Acquired absence of kidney: Secondary | ICD-10-CM | POA: Insufficient documentation

## 2014-01-18 DIAGNOSIS — C649 Malignant neoplasm of unspecified kidney, except renal pelvis: Secondary | ICD-10-CM

## 2014-01-18 DIAGNOSIS — I700 Atherosclerosis of aorta: Secondary | ICD-10-CM | POA: Insufficient documentation

## 2014-01-18 DIAGNOSIS — C641 Malignant neoplasm of right kidney, except renal pelvis: Secondary | ICD-10-CM

## 2014-01-18 LAB — CBC WITH DIFFERENTIAL (CANCER CENTER ONLY)
BASO#: 0 10*3/uL (ref 0.0–0.2)
BASO%: 0.4 % (ref 0.0–2.0)
EOS ABS: 0.9 10*3/uL — AB (ref 0.0–0.5)
EOS%: 11.9 % — ABNORMAL HIGH (ref 0.0–7.0)
HCT: 37.5 % — ABNORMAL LOW (ref 38.7–49.9)
HGB: 12.8 g/dL — ABNORMAL LOW (ref 13.0–17.1)
LYMPH#: 2.2 10*3/uL (ref 0.9–3.3)
LYMPH%: 29.5 % (ref 14.0–48.0)
MCH: 29.3 pg (ref 28.0–33.4)
MCHC: 34.1 g/dL (ref 32.0–35.9)
MCV: 86 fL (ref 82–98)
MONO#: 0.5 10*3/uL (ref 0.1–0.9)
MONO%: 7.1 % (ref 0.0–13.0)
NEUT%: 51.1 % (ref 40.0–80.0)
NEUTROS ABS: 3.8 10*3/uL (ref 1.5–6.5)
PLATELETS: 120 10*3/uL — AB (ref 145–400)
RBC: 4.37 10*6/uL (ref 4.20–5.70)
RDW: 14.2 % (ref 11.1–15.7)
WBC: 7.5 10*3/uL (ref 4.0–10.0)

## 2014-01-18 LAB — CMP (CANCER CENTER ONLY)
ALT: 14 U/L (ref 10–47)
AST: 23 U/L (ref 11–38)
Albumin: 3.4 g/dL (ref 3.3–5.5)
Alkaline Phosphatase: 174 U/L — ABNORMAL HIGH (ref 26–84)
BUN, Bld: 14 mg/dL (ref 7–22)
CHLORIDE: 100 meq/L (ref 98–108)
CO2: 30 meq/L (ref 18–33)
Calcium: 9.5 mg/dL (ref 8.0–10.3)
Creat: 1.2 mg/dl (ref 0.6–1.2)
Glucose, Bld: 80 mg/dL (ref 73–118)
Potassium: 4 mEq/L (ref 3.3–4.7)
SODIUM: 142 meq/L (ref 128–145)
Total Bilirubin: 0.5 mg/dl (ref 0.20–1.60)
Total Protein: 7.6 g/dL (ref 6.4–8.1)

## 2014-01-18 LAB — LACTATE DEHYDROGENASE: LDH: 161 U/L (ref 94–250)

## 2014-01-18 MED ORDER — IOHEXOL 350 MG/ML SOLN
70.0000 mL | Freq: Once | INTRAVENOUS | Status: AC | PRN
  Administered 2014-01-18: 70 mL via INTRAVENOUS

## 2014-01-18 NOTE — Progress Notes (Signed)
Hematology and Oncology Follow Up Visit  Gilbert [name] [id number] 05/26/1951 63 y.o. 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  Gilbert [name] is back for followup. O. about a weekend we saw him in April, he had a heart attack. He basically and died and was brought back by CPR. He was hospitalized. He had a stent placed.  He had a defibrillator on right now.  We did go ahead and do a CT scan of his chest abdomen and pelvis. This did not show any evidence of recurrent kidney cancer. We did the test today. Pravachol was he's been doing okay. He's playing golf. He is eating well. He is not having shortness of breath. No chest pain. There's been no change in bowel or bladder is.  His been no leg swelling.  Medications: Current outpatient prescriptions:aspirin EC 81 MG tablet, Take 1 tablet (81 mg total) by mouth daily., Disp: , Rfl: ;  atorvastatin (LIPITOR) 10 MG tablet, Take 1 tablet (10 mg total) by mouth daily at 6 PM., Disp: 30 tablet, Rfl: 2;  carvedilol (COREG) 12.5 MG tablet, Take 1 tablet (12.5 mg total) by mouth 2 (two) times daily with a meal., Disp: 60 tablet, Rfl: 2;  lansoprazole (PREVACID) 30 MG capsule, Take 30 mg by mouth daily., Disp: , Rfl:  lidocaine (LIDODERM) 5 %, Place 1 patch onto the skin daily. Remove & Discard patch within 12 hours or as directed by MD, Disp: 30 patch, Rfl: 0;  phenytoin (DILANTIN) 300 MG ER capsule, Take 1 capsule (300 mg total) by mouth every morning., Disp: 30 capsule, Rfl: 6;  prasugrel (EFFIENT) 10 MG TABS tablet, Take 1 tablet (10 mg total) by mouth daily., Disp: 30 tablet, Rfl: 1 lansoprazole (PREVACID) 30 MG capsule, Take 1 capsule (30 mg total) by mouth daily at 12 noon., Disp: 30 capsule, Rfl: 1;  lisinopril (PRINIVIL,ZESTRIL) 5 MG tablet, Take 1 tablet (5 mg total) by mouth daily., Disp: 30 tablet, Rfl: 1;  phenytoin (DILANTIN) 100 MG ER capsule, Take  3 capsules (300 mg total) by mouth daily., Disp: 30 capsule, Rfl: 1  Allergies:  Allergies  Allergen Reactions  . Oxycodone Swelling    Tongue and lip    Past Medical History, Surgical history, Social history, and Family History were reviewed and updated.  Review of Systems: As above  Physical Exam:  oral temperature is 97.4 F (36.3 C). His blood pressure is 136/70 and his pulse is 74. His respiration is 16 and oxygen saturation is 100%.   Well-developed and well-nourished white woman. Lungs are clear. Cardiac exam regular in rhythm with no murmurs rubs or bruits. Abdomen soft. At laparoscopy scars are well-healed for the right nephrectomy. No fluid wave. There is no palpable liver or spleen tip. Back exam shows no tenderness over the spine ribs or hips. Extremities shows no clubbing cyanosis or edema. Skin exam no rashes. Neurological exam is nonfocal.  Lab Results  Component Value Date   WBC 7.5 01/18/2014   HGB 12.8* 01/18/2014   HCT 37.5* 01/18/2014   MCV 86 01/18/2014   PLT 120* 01/18/2014     Chemistry      Component Value Date/Time   NA 142 01/18/2014 0900   NA 140 12/10/2013 0651   K 4.0 01/18/2014 0900   K 4.4 12/10/2013 0651   CL 100 01/18/2014 0900   CL 104 12/10/2013 0651   CO2 30  01/18/2014 0900   CO2 22 12/10/2013 0651   BUN 14 01/18/2014 0900   BUN 15 12/10/2013 0651   CREATININE 1.2 01/18/2014 0900   CREATININE 1.18 12/10/2013 0651      Component Value Date/Time   CALCIUM 9.5 01/18/2014 0900   CALCIUM 8.9 12/10/2013 0651   ALKPHOS 174* 01/18/2014 0900   ALKPHOS 181* 12/10/2013 0651   AST 23 01/18/2014 0900   AST 19 12/10/2013 0651   ALT 14 01/18/2014 0900   ALT 43 12/10/2013 0651   BILITOT 0.50 01/18/2014 0900   BILITOT 0.4 12/10/2013 0651         Impression and Plan: [name] is 63 year old gentleman with stage III clear cell cancer of the right kidney. He underwent laparoscopic nephrectomy in March of 2015. His scan today does not show any evidence of recurrent  disease.  I feel bad that he had a heart attack. Thankfully, he is doing well right now. He Cipriani be going for a implantable to for blader depending on how his Holter monitor dose.  I will plan for a followup CT scan in 4 months. I think this would be reasonable.  I will see him back sooner we have his CAT scan done.   Volanda Napoleon, MD 6/19/201511:41 AM

## 2014-02-04 ENCOUNTER — Other Ambulatory Visit: Payer: Self-pay

## 2014-02-04 ENCOUNTER — Telehealth: Payer: Self-pay

## 2014-02-04 NOTE — Telephone Encounter (Signed)
Patient's daughter called to request refills for medications (alisinopril 5 mg, lansotrandosle? , effent). Please return call and advise.

## 2014-02-04 NOTE — Telephone Encounter (Signed)
Patient's daughter called on his behalf requesting a refill on medications (prevacid) that patient was discharged from the hospital with. Advised patient's daughter that he would need to contact his PCP for refills on those medications.

## 2014-02-04 NOTE — Telephone Encounter (Signed)
Lm for rtn call- Pt does not have medication prescribed through our office.

## 2014-02-05 NOTE — Telephone Encounter (Signed)
This would best come from the cardiologist that put the patient on the medications.

## 2014-02-05 NOTE — Telephone Encounter (Signed)
Spoke to daughter- she states pt had a MI in April and was put on these medications. Advised daughter that we needed to see pt for follow up. Transferred to scheduling. Pt is almost out of medication and needs at least a 30 day supply till his appt.

## 2014-02-06 NOTE — Telephone Encounter (Signed)
Spoke w/daughter who agreed to have pt come to see Dr Everlene Farrier at 102 on Friday. She reports that he has enough of these medications until then. She stated that she had called the cardiologist's office to get RFs but they are unable to RF since they put him on them in the hosp, and referred her to PCP.   Daughter stated that pt will not report the following to Dr Everlene Farrier, but she will prob not be able to come w/pt and wants Dr to know. Pt has returned to old habits of poor eating and dipping tobacco since he has his MI. She also reports that tests showed that he has "no circulation" in part of his L leg and cardiologist advised that he Aydelott need to have more surgery for that. Dr Everlene Farrier, Jul[place]

## 2014-02-07 NOTE — Telephone Encounter (Signed)
These are okayed a refill for 6 months. Be sure we go ahead and schedule him as soon as we can get in to see me

## 2014-02-07 NOTE — Telephone Encounter (Signed)
Please refill all his medicines for 6 months.

## 2014-02-08 ENCOUNTER — Other Ambulatory Visit: Payer: Self-pay | Admitting: Emergency Medicine

## 2014-02-08 ENCOUNTER — Ambulatory Visit (INDEPENDENT_AMBULATORY_CARE_PROVIDER_SITE_OTHER): Payer: BC Managed Care – PPO | Admitting: Emergency Medicine

## 2014-02-08 VITALS — BP 130/66 | HR 74 | Temp 97.5°F | Resp 16 | Ht 70.0 in | Wt 169.0 lb

## 2014-02-08 DIAGNOSIS — C649 Malignant neoplasm of unspecified kidney, except renal pelvis: Secondary | ICD-10-CM

## 2014-02-08 DIAGNOSIS — R790 Abnormal level of blood mineral: Secondary | ICD-10-CM

## 2014-02-08 DIAGNOSIS — Z8739 Personal history of other diseases of the musculoskeletal system and connective tissue: Secondary | ICD-10-CM

## 2014-02-08 DIAGNOSIS — Z8639 Personal history of other endocrine, nutritional and metabolic disease: Secondary | ICD-10-CM

## 2014-02-08 DIAGNOSIS — I209 Angina pectoris, unspecified: Secondary | ICD-10-CM

## 2014-02-08 DIAGNOSIS — I25810 Atherosclerosis of coronary artery bypass graft(s) without angina pectoris: Secondary | ICD-10-CM

## 2014-02-08 DIAGNOSIS — Z862 Personal history of diseases of the blood and blood-forming organs and certain disorders involving the immune mechanism: Secondary | ICD-10-CM

## 2014-02-08 DIAGNOSIS — I739 Peripheral vascular disease, unspecified: Secondary | ICD-10-CM

## 2014-02-08 DIAGNOSIS — I25709 Atherosclerosis of coronary artery bypass graft(s), unspecified, with unspecified angina pectoris: Secondary | ICD-10-CM

## 2014-02-08 LAB — MAGNESIUM: Magnesium: 1.8 mg/dL (ref 1.5–2.5)

## 2014-02-08 LAB — COMPREHENSIVE METABOLIC PANEL
ALK PHOS: 190 U/L — AB (ref 39–117)
ALT: 8 U/L (ref 0–53)
AST: 17 U/L (ref 0–37)
Albumin: 4 g/dL (ref 3.5–5.2)
BUN: 17 mg/dL (ref 6–23)
CHLORIDE: 100 meq/L (ref 96–112)
CO2: 26 mEq/L (ref 19–32)
CREATININE: 1.22 mg/dL (ref 0.50–1.35)
Calcium: 9.8 mg/dL (ref 8.4–10.5)
Glucose, Bld: 82 mg/dL (ref 70–99)
Potassium: 4.7 mEq/L (ref 3.5–5.3)
Sodium: 137 mEq/L (ref 135–145)
Total Bilirubin: 0.4 mg/dL (ref 0.2–1.2)
Total Protein: 7.4 g/dL (ref 6.0–8.3)

## 2014-02-08 LAB — CBC WITH DIFFERENTIAL/PLATELET
BASOS ABS: 0.1 10*3/uL (ref 0.0–0.1)
Basophils Relative: 1 % (ref 0–1)
Eosinophils Absolute: 1.3 10*3/uL — ABNORMAL HIGH (ref 0.0–0.7)
Eosinophils Relative: 13 % — ABNORMAL HIGH (ref 0–5)
HCT: 38.7 % — ABNORMAL LOW (ref 39.0–52.0)
Hemoglobin: 13.7 g/dL (ref 13.0–17.0)
LYMPHS PCT: 26 % (ref 12–46)
Lymphs Abs: 2.6 10*3/uL (ref 0.7–4.0)
MCH: 28.3 pg (ref 26.0–34.0)
MCHC: 35.4 g/dL (ref 30.0–36.0)
MCV: 80 fL (ref 78.0–100.0)
Monocytes Absolute: 0.7 10*3/uL (ref 0.1–1.0)
Monocytes Relative: 7 % (ref 3–12)
Neutro Abs: 5.2 10*3/uL (ref 1.7–7.7)
Neutrophils Relative %: 53 % (ref 43–77)
PLATELETS: 173 10*3/uL (ref 150–400)
RBC: 4.84 MIL/uL (ref 4.22–5.81)
RDW: 13.5 % (ref 11.5–15.5)
WBC: 9.9 10*3/uL (ref 4.0–10.5)

## 2014-02-08 LAB — URIC ACID: Uric Acid, Serum: 7.5 mg/dL (ref 4.0–7.8)

## 2014-02-08 MED ORDER — LISINOPRIL 5 MG PO TABS: 5.0000 mg | ORAL_TABLET | Freq: Every day | ORAL | Status: DC

## 2014-02-08 MED ORDER — PRASUGREL HCL 10 MG PO TABS: 10.0000 mg | ORAL_TABLET | Freq: Every day | ORAL | Status: DC

## 2014-02-08 MED ORDER — LANSOPRAZOLE 30 MG PO CPDR: 30.0000 mg | DELAYED_RELEASE_CAPSULE | Freq: Every day | ORAL | Status: DC

## 2014-02-08 NOTE — Progress Notes (Signed)
Subjective:    Patient ID: Gilbert [name], male    DOB: 1951/03/03, 63 y.o.   MRN: [id number] This chart was scribed for [name], MD by Vernell Barrier, Medical Scribe. The patient was seen in room 4. This patient's care was started at 10:14 AM.  HPI HPI Comments: Gilbert [name] is a 63 y.o. male w/ hx of diabetes and CAD presents to the [facility] for follow up. Last seen here 11/04/13 for bilateral lower extremity edema and pain. Patient has been battling with renal cancer since March 2015. Cardiac arrest at home in April; had to be resusitated. Left heart cath April 2015 as well.   States he feels much better. Reports pain in the left lower extremity due to lack of blood circulation. Able to walk short distances but with difficulty and pain. States the leg will eventually go numb. Scheduled to see Dr. Einar Gip 7/15.  Last blood draw, lab work, and kidney function was June 2015. Told to follow up in 4 months.   Denies chest pain, abdominal pain   Review of Systems  Cardiovascular: Negative for chest pain.  Gastrointestinal: Negative for abdominal pain.   Objective:  Physical Exam CONSTITUTIONAL: Well developed/well nourished; Patient is wearing a life vest HEAD: Normocephalic/atraumatic EYES: EOMI/PERRL ENMT: Mucous membranes moist NECK: supple no meningeal signs SPINE:entire spine nontender CV: S1/S2 noted, no murmurs/rubs/gallops noted LUNGS: Lungs are clear to auscultation bilaterally, no apparent distress ABDOMEN: soft, nontender, no rebound or guarding GU:no cva tenderness NEURO: Pt is awake/alert, moves all extremitiesx4 EXTREMITIES: pulses normal, full ROM; Left foot is somewhat cold; no pulses felt. SKIN: warm, color normal PSYCH: no abnormalities of mood noted  Results for orders placed in visit on 01/18/14  CBC WITH DIFFERENTIAL ([facility])      Result Value Ref Range   WBC 7.5  4.0 - 10.0 10e3/uL   RBC 4.37  4.20 - 5.70 10e6/uL   HGB 12.8  (*) 13.0 - 17.1 g/dL   HCT 37.5 (*) 38.7 - 49.9 %   MCV 86  82 - 98 fL   MCH 29.3  28.0 - 33.4 pg   MCHC 34.1  32.0 - 35.9 g/dL   RDW 14.2  11.1 - 15.7 %   Platelets 120 (*) 145 - 400 10e3/uL   NEUT# 3.8  1.5 - 6.5 10e3/uL   LYMPH# 2.2  0.9 - 3.3 10e3/uL   MONO# 0.5  0.1 - 0.9 10e3/uL   Eosinophils Absolute 0.9 (*) 0.0 - 0.5 10e3/uL   BASO# 0.0  0.0 - 0.2 10e3/uL   NEUT% 51.1  40.0 - 80.0 %   LYMPH% 29.5  14.0 - 48.0 %   MONO% 7.1  0.0 - 13.0 %   EOS% 11.9 (*) 0.0 - 7.0 %   BASO% 0.4  0.0 - 2.0 %  LACTATE DEHYDROGENASE      Result Value Ref Range   LDH 161  94 - 250 U/L  COMPREHENSIVE METABOLIC PANEL ([facility] REF[facility] ONLY)      Result Value Ref Range   Sodium 142  128 - 145 mEq/L   Potassium 4.0  3.3 - 4.7 mEq/L   Chloride 100  98 - 108 mEq/L   CO2 30  18 - 33 mEq/L   Glucose, Bld 80  73 - 118 mg/dL   BUN, Bld 14  7 - 22 mg/dL   Creat 1.2  0.6 - 1.2 mg/dl   Total Bilirubin 0.50  0.20 - 1.60 mg/dl  Alkaline Phosphatase 174 (*) 26 - 84 U/L   AST 23  11 - 38 U/L   ALT(SGPT) 14  10 - 47 U/L   Total Protein 7.6  6.4 - 8.1 g/dL   Albumin 3.4  3.3 - 5.5 g/dL   Calcium 9.5  8.0 - 10.3 mg/dL     Assessment & Plan:  She looks great today. He is scheduled for a procedure on his left leg for his peripheral arterial disease. Overall he he is doing incredibly well I personally performed the services described in this documentation, which was scribed in my presence. The recorded information has been reviewed and is accurate.

## 2014-02-08 NOTE — Progress Notes (Deleted)
   Subjective:    Patient ID: Gilbert [name], male    DOB: 06-20-1951, 63 y.o.   MRN: [id number]  HPI    Review of Systems     Objective:   Physical Exam        Assessment & Plan:

## 2014-02-15 LAB — ALKALINE PHOSPHATASE ISOENZYMES
Alkaline Phonsphatase: 219 U/L — ABNORMAL HIGH (ref 40–115)
BONE ISOENZYMES (ALP ISO): 20 % — AB (ref 28–66)
INTESTINAL ISOENZYMES (ALP ISO): 0 % — AB (ref 1–24)
LIVER ISOENZYMES (ALP ISO): 80 % — AB (ref 25–69)
MACROHEPATIC ISOENZYMES: 0 %

## 2014-02-18 NOTE — Telephone Encounter (Signed)
Pt came to see Dr Everlene Farrier 7/10 and RFs were given.

## 2014-02-22 ENCOUNTER — Encounter (HOSPITAL_COMMUNITY): Payer: Self-pay | Admitting: Pharmacy Technician

## 2014-02-26 ENCOUNTER — Encounter (HOSPITAL_COMMUNITY)
Admission: RE | Disposition: A | Discharge status: Home / Self Care | Payer: Self-pay | Source: Ambulatory Visit | Attending: Cardiology

## 2014-02-26 ENCOUNTER — Observation Stay (HOSPITAL_COMMUNITY)
Admission: RE | Admit: 2014-02-26 | Discharge: 2014-02-27 | Disposition: A | Discharge status: Home / Self Care | Payer: BC Managed Care – PPO | Source: Ambulatory Visit | Attending: Cardiology | Admitting: Cardiology

## 2014-02-26 DIAGNOSIS — I2589 Other forms of chronic ischemic heart disease: Secondary | ICD-10-CM | POA: Insufficient documentation

## 2014-02-26 DIAGNOSIS — Z5309 Procedure and treatment not carried out because of other contraindication: Secondary | ICD-10-CM | POA: Insufficient documentation

## 2014-02-26 DIAGNOSIS — I10 Essential (primary) hypertension: Secondary | ICD-10-CM | POA: Diagnosis not present

## 2014-02-26 DIAGNOSIS — Z905 Acquired absence of kidney: Secondary | ICD-10-CM | POA: Insufficient documentation

## 2014-02-26 DIAGNOSIS — I745 Embolism and thrombosis of iliac artery: Secondary | ICD-10-CM | POA: Diagnosis not present

## 2014-02-26 DIAGNOSIS — Z87891 Personal history of nicotine dependence: Secondary | ICD-10-CM | POA: Diagnosis not present

## 2014-02-26 DIAGNOSIS — I708 Atherosclerosis of other arteries: Secondary | ICD-10-CM | POA: Insufficient documentation

## 2014-02-26 DIAGNOSIS — Z9889 Other specified postprocedural states: Secondary | ICD-10-CM

## 2014-02-26 DIAGNOSIS — I97418 Intraoperative hemorrhage and hematoma of a circulatory system organ or structure complicating other circulatory system procedure: Secondary | ICD-10-CM | POA: Diagnosis present

## 2014-02-26 DIAGNOSIS — Z951 Presence of aortocoronary bypass graft: Secondary | ICD-10-CM | POA: Diagnosis not present

## 2014-02-26 DIAGNOSIS — I519 Heart disease, unspecified: Secondary | ICD-10-CM | POA: Insufficient documentation

## 2014-02-26 DIAGNOSIS — I4901 Ventricular fibrillation: Secondary | ICD-10-CM | POA: Diagnosis not present

## 2014-02-26 DIAGNOSIS — I743 Embolism and thrombosis of arteries of the lower extremities: Secondary | ICD-10-CM | POA: Insufficient documentation

## 2014-02-26 DIAGNOSIS — Z8553 Personal history of malignant neoplasm of renal pelvis: Secondary | ICD-10-CM | POA: Insufficient documentation

## 2014-02-26 DIAGNOSIS — I2510 Atherosclerotic heart disease of native coronary artery without angina pectoris: Secondary | ICD-10-CM | POA: Insufficient documentation

## 2014-02-26 HISTORY — PX: LOWER EXTREMITY ANGIOGRAM: SHX5508

## 2014-02-26 LAB — POCT ACTIVATED CLOTTING TIME
ACTIVATED CLOTTING TIME: 168 s
Activated Clotting Time: 219 seconds
Activated Clotting Time: 242 seconds
Activated Clotting Time: 214 seconds

## 2014-02-26 SURGERY — ANGIOGRAM, LOWER EXTREMITY
Anesthesia: LOCAL

## 2014-02-26 MED ORDER — PRASUGREL HCL 10 MG PO TABS
10.0000 mg | ORAL_TABLET | Freq: Every day | ORAL | Status: DC
  Administered 2014-02-27: 10 mg via ORAL
  Filled 2014-02-26: qty 1

## 2014-02-26 MED ORDER — FENTANYL CITRATE 0.05 MG/ML IJ SOLN
50.0000 ug | INTRAMUSCULAR | Status: DC | PRN
  Administered 2014-02-26: 50 ug via INTRAVENOUS
  Filled 2014-02-26: qty 2

## 2014-02-26 MED ORDER — CARVEDILOL 12.5 MG PO TABS
12.5000 mg | ORAL_TABLET | Freq: Two times a day (BID) | ORAL | Status: DC
  Administered 2014-02-26 – 2014-02-27 (×2): 12.5 mg via ORAL
  Filled 2014-02-26 (×3): qty 1

## 2014-02-26 MED ORDER — PANTOPRAZOLE SODIUM 40 MG PO TBEC
40.0000 mg | DELAYED_RELEASE_TABLET | Freq: Every day | ORAL | Status: DC
  Administered 2014-02-27: 40 mg via ORAL
  Filled 2014-02-26: qty 1

## 2014-02-26 MED ORDER — PHENYTOIN SODIUM EXTENDED 100 MG PO CAPS
300.0000 mg | ORAL_CAPSULE | Freq: Every day | ORAL | Status: DC
  Administered 2014-02-27: 300 mg via ORAL
  Filled 2014-02-26: qty 3

## 2014-02-26 MED ORDER — LIDOCAINE HCL (PF) 1 % IJ SOLN
INTRAMUSCULAR | Status: AC
  Filled 2014-02-26: qty 30

## 2014-02-26 MED ORDER — ASPIRIN EC 81 MG PO TBEC
81.0000 mg | DELAYED_RELEASE_TABLET | Freq: Every day | ORAL | Status: DC
  Administered 2014-02-27: 10:00:00 81 mg via ORAL
  Filled 2014-02-26: qty 1

## 2014-02-26 MED ORDER — LISINOPRIL 5 MG PO TABS
5.0000 mg | ORAL_TABLET | Freq: Every day | ORAL | Status: DC
  Administered 2014-02-27: 10:00:00 5 mg via ORAL
  Filled 2014-02-26: qty 1

## 2014-02-26 MED ORDER — HEPARIN (PORCINE) IN NACL 2-0.9 UNIT/ML-% IJ SOLN
INTRAMUSCULAR | Status: AC
  Filled 2014-02-26: qty 1000

## 2014-02-26 MED ORDER — HEPARIN SODIUM (PORCINE) 1000 UNIT/ML IJ SOLN
INTRAMUSCULAR | Status: AC
  Filled 2014-02-26: qty 1

## 2014-02-26 MED ORDER — MIDAZOLAM HCL 2 MG/2ML IJ SOLN
INTRAMUSCULAR | Status: AC
  Filled 2014-02-26: qty 2

## 2014-02-26 MED ORDER — ALPRAZOLAM 0.5 MG PO TABS: 0.5000 mg | ORAL_TABLET | Freq: Three times a day (TID) | ORAL | Status: DC | PRN

## 2014-02-26 MED ORDER — SODIUM CHLORIDE 0.9 % IV SOLN
INTRAVENOUS | Status: DC
  Administered 2014-02-26: 09:00:00 via INTRAVENOUS

## 2014-02-26 MED ORDER — ATORVASTATIN CALCIUM 10 MG PO TABS
10.0000 mg | ORAL_TABLET | Freq: Every day | ORAL | Status: DC
  Filled 2014-02-26: qty 1

## 2014-02-26 NOTE — Progress Notes (Signed)
ACT drawn @1320  results 214 sec

## 2014-02-26 NOTE — Progress Notes (Signed)
Site area: right groin, 7 fr sheath in the femoral artery Site Prior to Removal:  Level 1, bleeding around the sheath  Pressure Applied For  20 MINUTES   Manual: yes   Patient Status During Pull:  No complications  Post Pull Groin Site:  Level 1, slight bruising  Post Pull Instructions Given: yes  Post Pull Pulses Present: dp +1  Dressing Applied:  Tegaderm  Bedrest begins 1505 Comments pt is coughing periodically very forcefully, i held site and he was taught to hold when he coughs.

## 2014-02-26 NOTE — Discharge Instructions (Signed)
Angiogram, Care After °Refer to this sheet in the next few weeks. These instructions provide you with information on caring for yourself after your procedure. Your health care provider Pyles also give you more specific instructions. Your treatment has been planned according to current medical practices, but problems sometimes occur. Call your health care provider if you have any problems or questions after your procedure.  °WHAT TO EXPECT AFTER THE PROCEDURE °After your procedure, it is typical to have the following sensations: °· Minor discomfort or tenderness and a small bump at the catheter insertion site. The bump should usually decrease in size and tenderness within 1 to 2 weeks. °· Any bruising will usually fade within 2 to 4 weeks. °HOME CARE INSTRUCTIONS  °· You Barrett need to keep taking blood thinners if they were prescribed for you. Take medicines only as directed by your health care provider. °· Do not apply powder or lotion to the site. °· Do not take baths, swim, or use a hot tub until your health care provider approves. °· You Safi shower 24 hours after the procedure. Remove the bandage (dressing) and gently wash the site with plain soap and water. Gently pat the site dry. °· Inspect the site at least twice daily. °· Limit your activity for the first 48 hours. Do not bend, squat, or lift anything over 20 lb (9 kg) or as directed by your health care provider. °· Plan to have someone take you home after the procedure. Follow instructions about when you can drive or return to work. °SEEK MEDICAL CARE IF: °· You get light-headed when standing up. °· You have drainage (other than a small amount of blood on the dressing). °· You have chills. °· You have a fever. °· You have redness, warmth, swelling, or pain at the insertion site. °SEEK IMMEDIATE MEDICAL CARE IF:  °· You develop chest pain or shortness of breath, feel faint, or pass out. °· You have bleeding, swelling larger than a walnut, or drainage from the  catheter insertion site. °· You develop pain, discoloration, coldness, or severe bruising in the leg or arm that held the catheter. °· You develop bleeding from any other place, such as the bowels. You Cragg see bright red blood in your urine or stools, or your stools Snyders appear black and tarry. °· You have heavy bleeding from the site. If this happens, hold pressure on the site. °MAKE SURE YOU: °· Understand these instructions. °· Will watch your condition. °· Will get help right away if you are not doing well or get worse. °Document Released: 02/04/2005 Document Revised: 12/03/2013 Document Reviewed: 12/11/2012 °Exit[employer]® Patient Information ©2015 [employer]. This information is not intended to replace advice given to you by your health care provider. Make sure you discuss any questions you have with your health care provider. ° ° °

## 2014-02-26 NOTE — Interval H&P Note (Signed)
History and Physical Interval Note:  02/26/2014 11:17 AM  Gilbert [name]  has presented today for surgery, with the diagnosis of PVD, claudificationj  The various methods of treatment have been discussed with the patient and family. After consideration of risks, benefits and other options for treatment, the patient has consented to  Procedure(s): LOWER EXTREMITY ANGIOGRAM (N/A) and possible PTA as a surgical intervention .  The patient's history has been reviewed, patient examined, no change in status, stable for surgery.  I have reviewed the patient's chart and labs.  Questions were answered to the patient's satisfaction.     [name]

## 2014-02-26 NOTE — Progress Notes (Signed)
Took over manual pressure at 1705. No hematoma at rt groin site. Small dot sized bruise. Held pressure X 25 minutes. Observed site before dressing; approx 3 minutes later, site popped and rebled. Immediate manual pressure resumed. Dr. Einar Gip paged and informed. Order received for fem stop. Fem stop applied rt groin at 1755 w/59mmHg. Rt groin w/o bleeding nor hematoma.Rt DP pulse palpable at 2+. Rt foot pink. VSS. Instructions reviewed w/patient. Family in to see patient

## 2014-02-26 NOTE — H&P (Signed)
  Please see office visit notes for complete details of HPI.  

## 2014-02-26 NOTE — Progress Notes (Signed)
Pt states he feels dripping on his (R)  Leg. Pt had bright red blood oozing from his (R) groin. Pressure instantly applied by Blima Dessert ,R.N.  and bleeding controlled. Cath lab R.N. Eddie Dibbles notified and took over hold without incident.  Pts VSS. Pt and  family kept informed.  Dr. Einar Gip notified. Orders received to admit pt to a telemetry unit.  Bed control notified.

## 2014-02-26 NOTE — Progress Notes (Signed)
Transferred to 6c07.  Report given to Karlene,R.N. Pt with stable VS and fem/stop intact.  Hemostasis remained to (R) groin site throughout transfer. Pt and family kept informed

## 2014-02-26 NOTE — CV Procedure (Signed)
Abdominal aortogram, bilateral limited femoral arteriogram, crossover from right into the left iliac artery and left iliac arteriogram with distal runoff. Placement of catheter tip into the left superficial femoral artery and left superficial femoral arteriogram.  Initial attempt for balloon angioplasty of the left external iliac artery and proximal left common femoral artery abandoned due to presence of thrombus.  Indication: Patient is a 63 year old Caucasian male with history of seizures, HTN, and recent nephrectomy. Found unresponsive in recliner 11/26/2013, underwent complex left main coronary artery stenting and LAD stenting, we had utilized Impella circulated assist device in the left femoral artery at that time.  Patient has been doing well from cardiac standpoint, without significant claudication and outpatient evaluation of her peripheral artery disease revealed occluded left external iliac artery. Hence is brought to the peripheral angiography suite to evaluate his peripheral anatomy and to evaluate him for possible angioplasty.  Angiographic data: Abdominal aortogram revealed mild abdominal atherosclerosis. Bilateral common iliac artery showed mild diffuse atherosclerotic changes. Right common femoral artery was widely patent.  Left iliac artery after the origin of internal iliac artery is occluded. This reconstitutes at the level of the femoral head. Left femoral artery with distal runoff was widely patent and smooth, the left anterior tibial artery appeared to have slow flow, cannot exclude potential distal occlusion. However there was definite two-vessel runoff below the left knee.  Attempted angioplasty: I felt that the occluded external iliac artery balloon angioplasty can be attempted and if you had a decent result, patient could potentially avoid major surgery including either aortofemoral or femoral femoral bypass surgery with this severe LV systolic dysfunction and recent cardiac  arrest. Hence I decided to attempt to cross the CTO. With moderate amount of difficulty I was able to cross the distal, I feel that the distal Was the initial culprit area. After having performed this, with moderate amount of difficulty and was able to place a Glidewire into the left SFA and we decided to proceed with intervention.  After achieving anticoagulation with ACT greater than 200, I placed a 7 [place] Ansel 45 cm sheath and I was able to cross the occlusion and placed the tip of the sheath into the left SFA. I then extended the Versacore short wire to 290 cm by using extension, and angiography was performed after withdrawing the Ancel sheath into the left common iliac artery. There was recannulization of the left external iliac artery, however thrombus burden was evident.  It was felt that potential embolic phenomena Kane complicate the procedure, at this point we withdrew the wire, angiography repeated to visualize patency and continued flow through the internal iliac artery which had collateralized the left femoral artery. All this was patent. Felt stable for discharge, even if the left iliac artery where to reocclude, his flow to the left leg is via collaterals.  I will discuss with the family and referring for surgery, Dr. Annamarie Major is aware of the case. There was no immediate competition.  Technique: Under sterile precautions using a 5 French right femoral arterial access a 5 French pigtail was advanced into the abdominal aorta and abdominal aortogram was performed. Same catheter was utilized to engage the left iliac artery and using Versacore wire, was able to cross into the left iliac artery, a 5 French angled catheter was utilized and placed into the left common iliac artery and angiography was performed. Left leg runoff was also performed.  Technique of attempted intervention: Please see my above notes. The Ancel sheath was withdrawn  out of the body and a short 7 [place] sheath was  introduced into the right common femoral artery and sutured in place.

## 2014-02-27 DIAGNOSIS — I745 Embolism and thrombosis of iliac artery: Secondary | ICD-10-CM | POA: Diagnosis not present

## 2014-02-27 NOTE — Progress Notes (Signed)
Patient admitted to the unit post PV procedure access of right groin he originally went to short stay then developed bleeding complication of groin requiring femstop and to stay overnight. He arrived to me for groin management overnight. Femstop was removed about 2330 and tolerated well. No bleeding has occurred and site is level zero it was checked multiple time through the night.  Patient has remained on bedrest overnight. Dr. Einar Gip will here to reassess patient this morning.

## 2014-02-27 NOTE — Discharge Summary (Signed)
Physician Discharge Summary  Patient ID: Gilbert [name] MRN: [id number] DOB/AGE: [birth year] 63 y.o.   Admit date: 02/26/2014 Discharge date: 02/27/2014  Primary Discharge Diagnosis PAD, iatrogenic left external iliac artery occlusion, left common femoral artery occlusion. The minimal disease in the left lower extremity otherwise. Mild iliac disease on the right. Ischemic cardiomyopathy with severe left ventricular systolic dysfunction. H/O C-Arrest: V fib/V tach/PEA with need for CPR 11/26/2013 CAD 11/26/2013: Placement of circulatory support, Impella CP via left femoral access, PTCA and stenting of the left main, proximal LAD with implantation of a 4.0 x 18 mm Xience alpine stent.   Secondary Discharge Diagnosis Hypertension History of renal carcinoma status post right nephrectomy History of tobacco use disorder, chew tobacco.  Significant Diagnostic Studies: 02/26/2014 Abdominal aortogram, bilateral limited femoral arteriogram, crossover from right into the left iliac artery and left iliac arteriogram with distal runoff. Placement of catheter tip into the left superficial femoral artery and left superficial femoral arteriogram.  Initial attempt for balloon angioplasty of the left external iliac artery and proximal left common femoral artery abandoned due to presence of thrombus.   Consults:  Obtain a curbside consultation with Dr. Annamarie Major during the procedure. Postprocedure I have discussed with him regarding his clinical status, we be making a referral for evaluation of surgical revascularization due to symptomatic left leg claudication.  Hospital Course: Patient was admitted to the hospital for elective peripheral angiography and possible angioplasty. Post procedure patient had difficulty in maintaining hemostasis, hence had to be admitted overnight for observation. The following morning he had no hematoma in his right groin, no complication from the procedure, left leg is warm, left foot is  mildly colder than the right which is unchanged from prior examination. There is no clinical evidence of acute arterial insufficiency. He was felt stable for discharge, with outpatient followup.   Recommendations on discharge: An appointment will be set up with Dr. Annamarie Major for outpatient evaluation of CAD and possible bypass surgery. Recent echocardiogram done in the outpatient basis reveals persistent severe LV systolic dysfunction, patient is presently wearing Lifevest  he'll also be need to be referred to EP for evaluation for ICD implantation.  Discharge Exam: Blood pressure 127/67, pulse 80, temperature 98 F (36.7 C), temperature source Oral, resp. rate 20, height 5\' 10"  (1.778 m), weight 74.2 kg (163 lb 9.3 oz), SpO2 95.00%.  General appearance: alert, cooperative, appears stated age and no distress Resp: clear to auscultation bilaterally Cardio: regular rate and rhythm, S1, S2 normal, no murmur, click, rub or gallop and distant heart sounds Extremities: extremities normal, atraumatic, no cyanosis or edema Pulses: Bilateral carotids normal, her pulses on the right normal, left absent. Right groin no hematoma. Right pedal pulse 1-2+, left pedal pulse absent. No evidence of acute arterial insufficiency. Incision/Wound: No right groin hematoma. Labs:   Lab Results  Component Value Date   WBC 9.9 02/08/2014   HGB 13.7 02/08/2014   HCT 38.7* 02/08/2014   MCV 80.0 02/08/2014   PLT 173 02/08/2014   No results found for this basename: NA, K, CL, CO2, BUN, CREATININE, CALCIUM, LABALBU, PROT, BILITOT, ALKPHOS, ALT, AST, GLUCOSE,  in the last 168 hours Lab Results  Component Value Date   TROPONINI >20.00* 11/29/2013    Lipid Panel     Component Value Date/Time   CHOL 137 11/30/2013 0435   TRIG 167* 11/30/2013 0435   HDL 30* 11/30/2013 0435   CHOLHDL 4.6 11/30/2013 0435   VLDL 33 11/30/2013 0435   LDLCALC  74 11/30/2013 0435   FOLLOW UP PLANS AND APPOINTMENTS    Medication List          aspirin EC 81 MG tablet  Take 1 tablet (81 mg total) by mouth daily.     atorvastatin 10 MG tablet  Commonly known as:  LIPITOR  Take 1 tablet (10 mg total) by mouth daily at 6 PM.     carvedilol 12.5 MG tablet  Commonly known as:  COREG  Take 6.25-12.5 mg by mouth 2 (two) times daily with a meal. Takes 12.5mg  daily with breakfast and takes 6.25mg  daily with supper.     lansoprazole 30 MG capsule  Commonly known as:  PREVACID  Take 30 mg by mouth daily.     lisinopril 5 MG tablet  Commonly known as:  PRINIVIL,ZESTRIL  Take 1 tablet (5 mg total) by mouth daily.     phenytoin 300 MG ER capsule  Commonly known as:  DILANTIN  Take 1 capsule (300 mg total) by mouth every morning.     prasugrel 10 MG Tabs tablet  Commonly known as:  EFFIENT  Take 1 tablet (10 mg total) by mouth daily.     psyllium 0.52 G capsule  Commonly known as:  REGULOID  Take 1.56 g by mouth at bedtime.           Follow-up Information   Follow up with [name], MD. (Keep previous appointment)    Specialty:  Cardiology   Contact information:   [place] [place] [address] [phone]       Call Eldridge Abrahams, MD. (He is aware of your condition. They will also contact you.)    Specialty:  Vascular Surgery   Contact information:   [place] [address] [phone]        [name], MD 02/27/2014, 8:38 AM  Pager: [phone] Office: [phone] If no answer: [phone]

## 2014-02-28 ENCOUNTER — Telehealth: Payer: Self-pay | Admitting: Surgery

## 2014-02-28 NOTE — Telephone Encounter (Signed)
Message copied by Gena Fray on Thu Feb 28, 2014  3:23 PM ------      Message from: Peter Minium K      Created: Thu Feb 28, 2014  1:13 PM      Regarding: Schedule                   ----- Message -----         From: Serafina Mitchell, MD         Sent: 02/28/2014  12:54 PM           To: Mena Goes, RN, [facility]            Please get in to see me in the office within the next 2 weeks.  Thanks       ----- Message -----         From: [name], MD         Sent: 02/27/2014   8:47 AM           To: Serafina Mitchell, MD, [name], MD            Please evaluate for left iliac and femoral disease and possible need for surgical revascularization. Please see my noted for procedure on EPIC       ------

## 2014-02-28 NOTE — Telephone Encounter (Signed)
Already scheduled, 8/10 @ 4:00pm, dpm

## 2014-03-11 ENCOUNTER — Encounter: Payer: BC Managed Care – PPO | Admitting: Surgery

## 2014-03-12 ENCOUNTER — Encounter: Payer: Self-pay | Admitting: Surgery

## 2014-03-13 ENCOUNTER — Ambulatory Visit (INDEPENDENT_AMBULATORY_CARE_PROVIDER_SITE_OTHER): Payer: BC Managed Care – PPO | Admitting: Surgery

## 2014-03-13 ENCOUNTER — Encounter: Payer: Self-pay | Admitting: Surgery

## 2014-03-13 VITALS — BP 137/87 | HR 65 | Resp 18 | Ht 70.0 in | Wt 172.2 lb

## 2014-03-13 DIAGNOSIS — I739 Peripheral vascular disease, unspecified: Secondary | ICD-10-CM

## 2014-03-13 NOTE — Progress Notes (Signed)
Patient name: Gilbert [name] MRN: [id number] DOB: 04-15-1951 Sex: male   Referred by: Dr. Einar Gip  Reason for referral:  Chief Complaint  Patient presents with  . New Evaluation    referred by Dr Einar Gip   s/p abd aortogram    HISTORY OF PRESENT ILLNESS: This is a very pleasant 63 year old gentleman who is referred today for evaluation of left leg claudication.  The patient recently underwent subintimal recanalization of an occluded left external iliac artery and subsequent balloon angioplasty by Dr. Nadyne Coombes.  The disease extended into the proximal common femoral artery and therefore no stent was placed.  Ron sure how durable this would be, however the patient has had return of a palpable pulse and resolution of most of his claudication symptoms.  He is sent over to discuss possible surgical treatment of the distal end of the lesion since this went into the proximal common femoral artery on the left.  This occlusion was most likely secondary to the Impela device.  The patient does have a significant cardiac history.  He is currently wearing a life left.  There is also discussions for pacemaker placement.  He was recently found unresponsive in a recliner on 12/26/2013.  He underwent complex left main coronary artery stenting as well as LAD stenting.  He has been relatively stable from a cardiac standpoint.  The patient is medically managed for hypercholesterolemia with a statin.  His hypertension is managed with multiple medications including an ACE inhibitor.  The patient has a history of smoking but has quit.  Past Medical History  Diagnosis Date  . Gout   . GERD (gastroesophageal reflux disease)   . Hypertension   . Hypercholesteremia   . Cancer     right kidney cancer  . Shortness of breath     with activity  . Epilepsy     at least 10 years ago   . Family history of anesthesia complication     mother-had stroke during anesthesia  . HTN (hypertension)   . Seizures   . Cardiac arrest    . Cancer     Past Surgical History  Procedure Laterality Date  . No past surgeries    . Eye surgery Left 2 weeks ago    torn retina  . Robot assisted laparoscopic nephrectomy Right 10/19/2013    Procedure: ROBOTIC ASSISTED LAPAROSCOPIC NEPHRECTOMY,  EXTENSIVE ADHESIOLYSIS;  Surgeon: Alexis Frock, MD;  Location: [facility];  Service: Urology;  Laterality: Right;    History   Social History  . Marital Status: Married    Spouse Name: N/A    Number of Children: N/A  . Years of Education: N/A   Occupational History  . Not on file.   Social History Main Topics  . Smoking status: Former Smoker    Start date: 06/11/2003  . Smokeless tobacco: Current User    Types: Chew  . Alcohol Use: No     Comment: quit 1980  . Drug Use: No     Comment: none in past 30 years   . Sexual Activity: Not on file   Other Topics Concern  . Not on file   Social History Narrative   ** Merged History Encounter **        Family History  Problem Relation Age of Onset  . Stroke Mother   . Heart disease Brother   . Hyperlipidemia Brother     Allergies as of 03/13/2014 - Review Complete 03/13/2014  Allergen Reaction Noted  .  Oxycodone Swelling 12/02/2013    Current Outpatient Prescriptions on File Prior to Visit  Medication Sig Dispense Refill  . aspirin EC 81 MG tablet Take 1 tablet (81 mg total) by mouth daily.      Marland Kitchen atorvastatin (LIPITOR) 10 MG tablet Take 1 tablet (10 mg total) by mouth daily at 6 PM.  30 tablet  2  . carvedilol (COREG) 12.5 MG tablet Take 6.25-12.5 mg by mouth 2 (two) times daily with a meal. Takes 12.5mg  daily with breakfast and takes 6.25mg  daily with supper.      . lansoprazole (PREVACID) 30 MG capsule Take 30 mg by mouth daily.      Marland Kitchen lisinopril (PRINIVIL,ZESTRIL) 5 MG tablet Take 1 tablet (5 mg total) by mouth daily.  30 tablet  1  . phenytoin (DILANTIN) 300 MG ER capsule Take 1 capsule (300 mg total) by mouth every morning.  30 capsule  6  . prasugrel (EFFIENT) 10  MG TABS tablet Take 1 tablet (10 mg total) by mouth daily.  30 tablet  1  . psyllium (REGULOID) 0.52 G capsule Take 1.56 g by mouth at bedtime.        No current facility-administered medications on file prior to visit.     REVIEW OF SYSTEMS: Cardiovascular: No chest pain, chest pressure, palpitations, orthopnea, or dyspnea on exertion.  Left leg claudication which is improved,  No history of DVT or phlebitis. Pulmonary: No productive cough, asthma or wheezing. Neurologic: No weakness, paresthesias, aphasia, or amaurosis. No dizziness. Hematologic: No bleeding problems or clotting disorders. Musculoskeletal: No joint pain or joint swelling. Gastrointestinal: No blood in stool or hematemesis Genitourinary: No dysuria or hematuria. Psychiatric:: No history of major depression. Integumentary: No rashes or ulcers. Constitutional: No fever or chills.  PHYSICAL EXAMINATION: General: The patient appears their stated age.  Vital signs are BP 137/87  Pulse 65  Resp 18  Ht 5\' 10"  (1.778 m)  Wt 172 lb 3.2 oz (78.109 kg)  BMI 24.71 kg/m2 HEENT:  No gross abnormalities Pulmonary: Respirations are non-labored Abdomen: Soft and non-tender  Musculoskeletal: There are no major deformities.   Neurologic: No focal weakness or paresthesias are detected, Skin: There are no ulcer or rashes noted. Psychiatric: The patient has normal affect. Cardiovascular: There is a regular rate and rhythm without significant murmur appreciated.  Palpable femoral pulses bilaterally  Diagnostic Studies: None    Assessment:  Peripheral vascular disease with left leg claudication Plan: Patient's symptoms have improved after balloon angioplasty of the occluded left external iliac artery.  The patient had a ultrasound yesterday at Dr. Irven Shelling office, however I do not have the results of this yet.  The patient does have palpable femoral pulse on the left.  At this point after discussing this further with Dr. Einar Gip, we  will monitor him for recurrence of symptoms.  Also further recommendations will be made based on the results of his ultrasound yesterday which I do not have access to currently.  Should the patient require additional treatment, we are considering using a Penumbra device to help remove some of the clot in the left external iliac artery.  Because this Blattner go into the common femoral artery on the left he [place] require surgical endarterectomy for for treatment of the lesion.  The patient will continue to get followup with Dr. Einar Gip, and I will be available should surgical revascularization be required.     Eldridge Abrahams, M.D. [facility] Office: [phone] Pager:  [phone]   

## 2014-03-31 ENCOUNTER — Other Ambulatory Visit: Payer: Self-pay | Admitting: Emergency Medicine

## 2014-04-03 ENCOUNTER — Encounter: Payer: Self-pay | Admitting: Emergency Medicine

## 2014-04-03 ENCOUNTER — Encounter: Payer: Self-pay | Admitting: *Deleted

## 2014-04-03 ENCOUNTER — Encounter: Payer: Self-pay | Admitting: Internal Medicine

## 2014-04-03 ENCOUNTER — Ambulatory Visit (INDEPENDENT_AMBULATORY_CARE_PROVIDER_SITE_OTHER): Payer: BC Managed Care – PPO | Admitting: Internal Medicine

## 2014-04-03 VITALS — BP 126/62 | HR 67 | Ht 70.0 in | Wt 174.8 lb

## 2014-04-03 DIAGNOSIS — I255 Ischemic cardiomyopathy: Secondary | ICD-10-CM | POA: Insufficient documentation

## 2014-04-03 DIAGNOSIS — I2589 Other forms of chronic ischemic heart disease: Secondary | ICD-10-CM

## 2014-04-03 DIAGNOSIS — Z01812 Encounter for preprocedural laboratory examination: Secondary | ICD-10-CM

## 2014-04-03 NOTE — Assessment & Plan Note (Signed)
The patient is s/p MI with a left main stent. He has an EF of 30%. He has class 2 CHF symptoms. I have discussed the treatment options with the patient and he wishes to proceed with ICD implant. The risks/benefits/goals/expectations of ICD implant have been discussed with the patient and he wishes to proceed.

## 2014-04-03 NOTE — Patient Instructions (Signed)
Your physician recommends that you continue on your current medications as directed. Please refer to the Current Medication list given to you today.  Your physician recommends that you return for pre-procedure lab work on 05/20/14.  Your physician has recommended that you have a defibrillator inserted. An implantable cardioverter defibrillator (ICD) is a small device that is placed in your chest or, in rare cases, your abdomen. This device uses electrical pulses or shocks to help control life-threatening, irregular heartbeats that could lead the heart to suddenly stop beating (sudden cardiac arrest). Leads are attached to the ICD that goes into your heart. This is done in the hospital and usually requires an overnight stay. Please see the instruction sheet given to you today for more information.  Your wound check is scheduled for 06/06/14 at 3:00 p.m. at [address]

## 2014-04-03 NOTE — Progress Notes (Signed)
HPI Gilbert [name] is referred today by Dr. Gwendel Hanson for evaluation and consideration of ICD implant. He is a pleasant 63 yo man with an ICM, chronic class 2 CHF, EF 30% by echo over 3 months after revascularization. He has no additional chest pain but does have claudication. He is pending additional peripheral vascular procedures. He has not had syncope and denies anginal symptoms. Allergies  Allergen Reactions  . Oxycodone Swelling    Tongue and lip     Current Outpatient Prescriptions  Medication Sig Dispense Refill  . aspirin EC 81 MG tablet Take 1 tablet (81 mg total) by mouth daily.      Marland Kitchen atorvastatin (LIPITOR) 10 MG tablet Take 1 tablet (10 mg total) by mouth daily at 6 PM.  30 tablet  2  . carvedilol (COREG) 12.5 MG tablet Take 12.5 mg by mouth 2 (two) times daily with a meal.       . Cyanocobalamin (B-12) 5000 MCG CAPS Take 1 capsule by mouth daily.      . lansoprazole (PREVACID) 30 MG capsule Take 30 mg by mouth daily.      Marland Kitchen lisinopril (PRINIVIL,ZESTRIL) 5 MG tablet TAKE ONE TABLET BY MOUTH ONCE DAILY  30 tablet  0  . phenytoin (DILANTIN) 300 MG ER capsule Take 1 capsule (300 mg total) by mouth every morning.  30 capsule  6  . prasugrel (EFFIENT) 10 MG TABS tablet Take 1 tablet (10 mg total) by mouth daily.  30 tablet  1  . psyllium (REGULOID) 0.52 G capsule Take 1.56 g by mouth at bedtime.       . pyridOXINE (VITAMIN B-6) 100 MG tablet Take 100 mg by mouth daily.      . Thiamine HCl (VITAMIN B-1) 250 MG tablet Take 250 mg by mouth daily.       No current facility-administered medications for this visit.     Past Medical History  Diagnosis Date  . Gout   . GERD (gastroesophageal reflux disease)   . Hypertension   . Hypercholesteremia   . Cancer     right kidney cancer  . Shortness of breath     with activity  . History of epilepsy     at least 10 years ago. 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 Family history of anesthesia complication     mother-had stroke during  anesthesia  . HTN (hypertension)   . Seizures   . History of myocardial infarction less than 8 weeks     Stent placed  . Cancer   . CAD (coronary artery disease)   . PAD (peripheral artery disease)   . Hyperlipemia   . Therapeutic drug monitoring   . Coronary artery calcification seen on CAT scan   . SOB (shortness of breath)   . History of tobacco use   . Systolic and diastolic CHF, chronic   . Essential hypertension, benign   . History of nephrectomy 11/2013    For renal cell carcinoma  . Acid reflux   . Abnormal liver enzymes     Chronic alkaline phosphatase elevation since 2014  . History of cardiac arrest 11/26/13    PTCA/Stenting of LM & Prox LAD with 4.0 x 18 mm Xience alpine stent. Used Impella Circulatory assist device. EF= 20-25%  . Coronary atherosclerosis of native coronary artery     ROS:   All systems reviewed and negative except as noted in the HPI.   Past Surgical History  Procedure Laterality Date  .  No past surgeries    . Eye surgery Left 2 weeks ago    torn retina  . Robot assisted laparoscopic nephrectomy Right 10/19/2013    Procedure: ROBOTIC ASSISTED LAPAROSCOPIC NEPHRECTOMY,  EXTENSIVE ADHESIOLYSIS;  Surgeon: Alexis Frock, MD;  Location: [facility];  Service: Urology;  Laterality: Right;  . Coronary angioplasty with stent placement  2015    Left main coronary artery stenting on emergent basis along with circulatory support with Impella device through left leg. With 4.0 x 18 mm Xience alpine stent  . Nephrectomy  11/2013     Family History  Problem Relation Age of Onset  . Stroke Mother   . Heart disease Brother     multiple stents  . Hyperlipidemia Brother   . Emphysema Father      History   Social History  . Marital Status: Married    Spouse Name: N/A    Number of Children: N/A  . Years of Education: N/A   Occupational History  . Not on file.   Social History Main Topics  . Smoking status: Former Smoker    Quit date: 08/03/2003  .  Smokeless tobacco: Current User    Types: Chew  . Alcohol Use: No     Comment: quit 1980  . Drug Use: No     Comment: none in past 30 years   . Sexual Activity: Not on file   Other Topics Concern  . Not on file   Social History Narrative   ** Merged History Encounter **         BP 126/62  Pulse 67  Ht 5\' 10"  (1.778 m)  Wt 174 lb 12.8 oz (79.289 kg)  BMI 25.08 kg/m2  Physical Exam:  Well appearing 63 yo man, NAD HEENT: Unremarkable Neck:  No JVD, no thyromegally Back:  No CVA tenderness Lungs:  Clear with no wheezes HEART:  Regular rate rhythm, no murmurs, no rubs, no clicks Abd:  soft, positive bowel sounds, no organomegally, no rebound, no guarding Ext:  2 plus pulses, no edema, no cyanosis, no clubbing Skin:  No rashes no nodules Neuro:  CN II through XII intact, motor grossly intact  EKG - nsr with Inferior T wave abnormality.  DEVICE  Normal device function.  See PaceArt for details.   Assess/Plan:

## 2014-04-05 ENCOUNTER — Other Ambulatory Visit: Payer: Self-pay | Admitting: *Deleted

## 2014-04-05 ENCOUNTER — Other Ambulatory Visit: Payer: Self-pay | Admitting: Emergency Medicine

## 2014-04-05 DIAGNOSIS — I255 Ischemic cardiomyopathy: Secondary | ICD-10-CM

## 2014-04-15 MED ORDER — SODIUM CHLORIDE 0.9 % IR SOLN
80.0000 mg | Status: AC
  Filled 2014-04-15: qty 2

## 2014-04-15 MED ORDER — CEFAZOLIN SODIUM-DEXTROSE 2-3 GM-% IV SOLR: 2.0000 g | INTRAVENOUS | Status: AC

## 2014-04-16 ENCOUNTER — Ambulatory Visit (HOSPITAL_COMMUNITY): Admission: RE | Admit: 2014-04-16 | Payer: BC Managed Care – PPO | Source: Ambulatory Visit | Admitting: Cardiology

## 2014-04-16 SURGERY — ANGIOGRAM, LOWER EXTREMITY
Anesthesia: LOCAL

## 2014-05-09 ENCOUNTER — Ambulatory Visit (HOSPITAL_BASED_OUTPATIENT_CLINIC_OR_DEPARTMENT_OTHER)
Admission: RE | Admit: 2014-05-09 | Discharge: 2014-05-09 | Disposition: A | Discharge status: Home / Self Care | Payer: BC Managed Care – PPO | Source: Ambulatory Visit | Attending: Hematology & Oncology | Admitting: Hematology & Oncology

## 2014-05-09 ENCOUNTER — Other Ambulatory Visit (HOSPITAL_BASED_OUTPATIENT_CLINIC_OR_DEPARTMENT_OTHER): Payer: BC Managed Care – PPO | Admitting: Lab

## 2014-05-09 ENCOUNTER — Encounter: Payer: Self-pay | Admitting: Family

## 2014-05-09 ENCOUNTER — Encounter (HOSPITAL_BASED_OUTPATIENT_CLINIC_OR_DEPARTMENT_OTHER): Payer: Self-pay

## 2014-05-09 ENCOUNTER — Ambulatory Visit (HOSPITAL_BASED_OUTPATIENT_CLINIC_OR_DEPARTMENT_OTHER): Payer: BC Managed Care – PPO | Admitting: Family

## 2014-05-09 VITALS — BP 151/60 | HR 62 | Temp 97.8°F | Resp 16 | Wt 176.0 lb

## 2014-05-09 DIAGNOSIS — C641 Malignant neoplasm of right kidney, except renal pelvis: Secondary | ICD-10-CM

## 2014-05-09 LAB — CBC WITH DIFFERENTIAL (CANCER CENTER ONLY)
BASO#: 0 10*3/uL (ref 0.0–0.2)
BASO%: 0.4 % (ref 0.0–2.0)
EOS%: 11.1 % — ABNORMAL HIGH (ref 0.0–7.0)
Eosinophils Absolute: 0.9 10*3/uL — ABNORMAL HIGH (ref 0.0–0.5)
HEMATOCRIT: 35.8 % — AB (ref 38.7–49.9)
HGB: 12.5 g/dL — ABNORMAL LOW (ref 13.0–17.1)
LYMPH#: 2.1 10*3/uL (ref 0.9–3.3)
LYMPH%: 27.1 % (ref 14.0–48.0)
MCH: 29.3 pg (ref 28.0–33.4)
MCHC: 34.9 g/dL (ref 32.0–35.9)
MCV: 84 fL (ref 82–98)
MONO#: 0.6 10*3/uL (ref 0.1–0.9)
MONO%: 7.3 % (ref 0.0–13.0)
NEUT#: 4.3 10*3/uL (ref 1.5–6.5)
NEUT%: 54.1 % (ref 40.0–80.0)
Platelets: 134 10*3/uL — ABNORMAL LOW (ref 145–400)
RBC: 4.26 10*6/uL (ref 4.20–5.70)
RDW: 14.1 % (ref 11.1–15.7)
WBC: 7.9 10*3/uL (ref 4.0–10.0)

## 2014-05-09 LAB — CMP (CANCER CENTER ONLY)
ALT(SGPT): 11 U/L (ref 10–47)
AST: 20 U/L (ref 11–38)
Albumin: 3.2 g/dL — ABNORMAL LOW (ref 3.3–5.5)
Alkaline Phosphatase: 143 U/L — ABNORMAL HIGH (ref 26–84)
BILIRUBIN TOTAL: 0.5 mg/dL (ref 0.20–1.60)
BUN, Bld: 22 mg/dL (ref 7–22)
CO2: 27 mEq/L (ref 18–33)
CREATININE: 1.2 mg/dL (ref 0.6–1.2)
Calcium: 9.6 mg/dL (ref 8.0–10.3)
Chloride: 103 mEq/L (ref 98–108)
Glucose, Bld: 96 mg/dL (ref 73–118)
Potassium: 4.5 mEq/L (ref 3.3–4.7)
Sodium: 142 mEq/L (ref 128–145)
Total Protein: 7 g/dL (ref 6.4–8.1)

## 2014-05-09 MED ORDER — IOHEXOL 300 MG/ML  SOLN
100.0000 mL | Freq: Once | INTRAMUSCULAR | Status: AC | PRN
  Administered 2014-05-09: 100 mL via INTRAVENOUS

## 2014-05-09 NOTE — Progress Notes (Signed)
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 He is doing well. His chest, abdomen and pelvis CT today did not show any recurrent kidney cancer. He is still wearing a heart monitor. He has had no cardiac issues since his last visit. He denies fever, chills, n/v, cough, rash, headache, dizziness, SOB, chest pain, palpitations, abdominal pain, constipation, diarrhea, blood in urine or stool. No swelling, tenderness or tingling in his extremities. He has some numbness in his left leg where there is an obstruction in his groin. He states that he has an appointment with vascular to have it removed. His appetite is good and he is staying hydrated. Overall, he seems to be doing well.   CURRENT TREATMENT: Observation  REVIEW OF SYSTEMS: All other 10 pont review of systems is negative.   PAST MEDICAL HISTORY: Past Medical History  Diagnosis Date  . Gout   . GERD (gastroesophageal reflux disease)   . Hypertension   . Hypercholesteremia   . Shortness of breath     with activity  . History of epilepsy     at least 10 years ago. 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 Family history of anesthesia complication     mother-had stroke during anesthesia  . HTN (hypertension)   . Seizures   . History of myocardial infarction less than 8 weeks     Stent placed  . CAD (coronary artery disease)   . PAD (peripheral artery disease)   . Hyperlipemia   . Therapeutic drug monitoring   . Coronary artery calcification seen on CAT scan   . SOB (shortness of breath)   . History of tobacco use   . Systolic and diastolic CHF, chronic    . Essential hypertension, benign   . History of nephrectomy 11/2013    For renal cell carcinoma  . Acid reflux   . Abnormal liver enzymes     Chronic alkaline phosphatase elevation since 2014  . History of cardiac arrest 11/26/13    PTCA/Stenting of LM & Prox LAD with 4.0 x 18 mm Xience alpine stent. Used Impella Circulatory assist device. EF= 20-25%  . Coronary atherosclerosis of native coronary artery   . Cancer     right kidney cancer  . Cancer    PAST SURGICAL HISTORY: Past Surgical History  Procedure Laterality Date  . No past surgeries    . Eye surgery Left 2 weeks ago    torn retina  . Robot assisted laparoscopic nephrectomy Right 10/19/2013    Procedure: ROBOTIC ASSISTED LAPAROSCOPIC NEPHRECTOMY,  EXTENSIVE ADHESIOLYSIS;  Surgeon: Alexis Frock, MD;  Location: [facility];  Service: Urology;  Laterality: Right;  . Coronary angioplasty with stent placement  2015    Left main coronary artery stenting on emergent basis along with circulatory support with Impella device through left leg. With 4.0 x 18 mm Xience alpine stent  . Nephrectomy  11/2013   FAMILY HISTORY Family History  Problem Relation Age of Onset  . Stroke Mother   . Heart disease Brother     multiple stents  . Hyperlipidemia Brother   . Emphysema Father    GYNECOLOGIC HISTORY:  No LMP for male  patient.   SOCIAL HISTORY:  History   Social History  . Marital Status: Single    Spouse Name: N/A    Number of Children: N/A  . Years of Education: N/A   Occupational History  . Not on file.   Social History Main Topics  . Smoking status: Former Smoker    Quit date: 08/03/2003  . Smokeless tobacco: Current User    Types: Chew  . Alcohol Use: No     Comment: quit 1980  . Drug Use: No     Comment: none in past 30 years   . Sexual Activity: Not on file   Other Topics Concern  . Not on file   Social History Narrative   ** Merged History Encounter **       ADVANCED DIRECTIVES: <no information>  HEALTH  MAINTENANCE: History  Substance Use Topics  . Smoking status: Former Smoker    Quit date: 08/03/2003  . Smokeless tobacco: Current User    Types: Chew  . Alcohol Use: No     Comment: quit 1980   Colonoscopy: PAP: Bone density: Lipid panel:  Allergies  Allergen Reactions  . Oxycodone Swelling    Tongue and lip   Current Outpatient Prescriptions  Medication Sig Dispense Refill  . aspirin EC 81 MG tablet Take 1 tablet (81 mg total) by mouth daily.      Marland Kitchen atorvastatin (LIPITOR) 10 MG tablet Take 1 tablet (10 mg total) by mouth daily at 6 PM.  30 tablet  2  . carvedilol (COREG) 12.5 MG tablet Take 12.5 mg by mouth 2 (two) times daily with a meal.       . Cyanocobalamin (B-12) 5000 MCG CAPS Take 1 capsule by mouth daily.      Marland Kitchen EFFIENT 10 MG TABS tablet TAKE ONE TABLET BY MOUTH ONCE DAILY  30 tablet  3  . lansoprazole (PREVACID) 30 MG capsule Take 30 mg by mouth daily.      Marland Kitchen lisinopril (PRINIVIL,ZESTRIL) 5 MG tablet TAKE ONE TABLET BY MOUTH ONCE DAILY  30 tablet  3  . phenytoin (DILANTIN) 300 MG ER capsule Take 1 capsule (300 mg total) by mouth every morning.  30 capsule  6  . pyridOXINE (VITAMIN B-6) 100 MG tablet Take 100 mg by mouth daily.      . Thiamine HCl (VITAMIN B-1) 250 MG tablet Take 250 mg by mouth daily.      . Psyllium (METAMUCIL) 28.3 % POWD Take by mouth every morning.        No current facility-administered medications for this visit.   OBJECTIVE: Filed Vitals:   05/09/14 1334  BP: 151/60  Pulse: 62  Temp: 97.8 F (36.6 C)  Resp: 16   Body mass index is 25.25 kg/(m^2). ECOG FS:0 - Asymptomatic Ocular: Sclerae unicteric, pupils equal, round and reactive to light Ear-nose-throat: Oropharynx clear, dentition fair Lymphatic: No cervical or supraclavicular adenopathy Lungs no rales or rhonchi, good excursion bilaterally Heart regular rate and rhythm, no murmur appreciated Abd soft, nontender, positive bowel sounds MSK no focal spinal tenderness, no joint  edema Neuro: non-focal, well-oriented, appropriate affect  LAB RESULTS: CMP     Component Value Date/Time   NA 142 05/09/2014 1023   NA 137 02/08/2014 1027   K 4.5 05/09/2014 1023   K 4.7 02/08/2014 1027   CL 103 05/09/2014 1023   CL 100 02/08/2014 1027   CO2 27 05/09/2014 1023   CO2 26 02/08/2014 1027   GLUCOSE 96 05/09/2014  1023   GLUCOSE 82 02/08/2014 1027   BUN 22 05/09/2014 1023   BUN 17 02/08/2014 1027   CREATININE 1.2 05/09/2014 1023   CREATININE 1.18 12/10/2013 0651   CALCIUM 9.6 05/09/2014 1023   CALCIUM 9.8 02/08/2014 1027   PROT 7.0 05/09/2014 1023   PROT 7.4 02/08/2014 1027   ALBUMIN 4.0 02/08/2014 1027   AST 20 05/09/2014 1023   AST 17 02/08/2014 1027   ALT 11 05/09/2014 1023   ALT 8 02/08/2014 1027   ALKPHOS 143* 05/09/2014 1023   ALKPHOS 190* 02/08/2014 1027   BILITOT 0.50 05/09/2014 1023   BILITOT 0.4 02/08/2014 1027   GFRNONAA 64* 12/10/2013 0651   GFRNONAA >89 09/25/2013 1441   GFRAA 74* 12/10/2013 0651   GFRAA >89 09/25/2013 1441   No results found for this basename: SPEP, UPEP,  kappa and lambda light chains   Lab Results  Component Value Date   WBC 7.9 05/09/2014   NEUTROABS 4.3 05/09/2014   HGB 12.5* 05/09/2014   HCT 35.8* 05/09/2014   MCV 84 05/09/2014   PLT 134* 05/09/2014   No results found for this basename: LABCA2   No components found with this basename: LABCA125   No results found for this basename: INR,  in the last 168 hours  STUDIES: No results found.  ASSESSMENT/PLAN: Gilbert [name] is 63 year old gentleman with stage III clear cell cancer of the right kidney. He underwent laparoscopic nephrectomy in March of 2015. His scan today does not show any evidence of recurrent disease.  He is doing well and is asymptomatic at this time.  We will have him get a repeat CT scan in 4 months and see him for follow-up and labs that same day.  He knows to call here with any questions or concerns and to go to the ED in the event of an emergency. We can certainly see him sooner if  need be.   Gilbert Bottom, NP 05/09/2014 2:10 PM

## 2014-05-10 ENCOUNTER — Telehealth: Payer: Self-pay | Admitting: Nurse Practitioner

## 2014-05-10 ENCOUNTER — Telehealth: Payer: Self-pay | Admitting: Hematology & Oncology

## 2014-05-10 LAB — LACTATE DEHYDROGENASE: LDH: 145 U/L (ref 94–250)

## 2014-05-10 LAB — CEA: CEA: 1.5 ng/mL (ref 0.0–5.0)

## 2014-05-10 NOTE — Telephone Encounter (Deleted)
Message copied by Jimmy Footman on Fri May 10, 2014 10:37 AM ------      Message from: Burney Gauze R      Created: Thu May 09, 2014  6:18 PM       Please call and let him know that there is no obvious recurrent cancer. We do need to do another scan in 4 months. Pete ------

## 2014-05-10 NOTE — Telephone Encounter (Addendum)
Message copied by Jimmy Footman on Fri May 10, 2014 10:49 AM ------      Message from: Burney Gauze R      Created: Thu May 09, 2014  6:18 PM       Please call and let him know that there is no obvious recurrent cancer. We do need to do another scan in 4 months. Pete ------Pt verbalized understanding and appreciation.

## 2014-05-10 NOTE — Telephone Encounter (Signed)
Left message to call added appointments to 2-11, mailed schedule with instructions

## 2014-05-15 ENCOUNTER — Encounter (HOSPITAL_COMMUNITY): Payer: Self-pay | Admitting: Pharmacy Technician

## 2014-05-20 ENCOUNTER — Other Ambulatory Visit: Payer: BC Managed Care – PPO

## 2014-05-22 ENCOUNTER — Telehealth: Payer: Self-pay | Admitting: Hematology & Oncology

## 2014-05-22 NOTE — Telephone Encounter (Signed)
Pt aware of 2-11 CT, drink contrast and be NPO 4 hrs.

## 2014-05-27 ENCOUNTER — Ambulatory Visit (HOSPITAL_COMMUNITY)
Admission: RE | Admit: 2014-05-27 | Payer: BC Managed Care – PPO | Source: Ambulatory Visit | Admitting: Internal Medicine

## 2014-05-27 ENCOUNTER — Encounter (HOSPITAL_COMMUNITY): Admission: RE | Payer: Self-pay | Source: Ambulatory Visit

## 2014-05-27 SURGERY — IMPLANTABLE CARDIOVERTER DEFIBRILLATOR IMPLANT
Anesthesia: LOCAL

## 2014-05-27 MED ORDER — SODIUM CHLORIDE 0.9 % IV SOLN: INTRAVENOUS | Status: DC

## 2014-05-28 ENCOUNTER — Encounter (HOSPITAL_COMMUNITY): Payer: Self-pay | Admitting: Cardiology

## 2014-05-28 ENCOUNTER — Ambulatory Visit (HOSPITAL_COMMUNITY)
Admission: RE | Admit: 2014-05-28 | Discharge: 2014-05-29 | Disposition: A | Discharge status: Home / Self Care | Payer: BC Managed Care – PPO | Source: Ambulatory Visit | Attending: Cardiology | Admitting: Cardiology

## 2014-05-28 ENCOUNTER — Encounter (HOSPITAL_COMMUNITY)
Admission: RE | Disposition: A | Discharge status: Home / Self Care | Payer: Self-pay | Source: Ambulatory Visit | Attending: Cardiology

## 2014-05-28 DIAGNOSIS — E785 Hyperlipidemia, unspecified: Secondary | ICD-10-CM | POA: Diagnosis not present

## 2014-05-28 DIAGNOSIS — R569 Unspecified convulsions: Secondary | ICD-10-CM | POA: Diagnosis not present

## 2014-05-28 DIAGNOSIS — Z79899 Other long term (current) drug therapy: Secondary | ICD-10-CM | POA: Diagnosis not present

## 2014-05-28 DIAGNOSIS — Z905 Acquired absence of kidney: Secondary | ICD-10-CM | POA: Diagnosis not present

## 2014-05-28 DIAGNOSIS — I739 Peripheral vascular disease, unspecified: Secondary | ICD-10-CM | POA: Diagnosis present

## 2014-05-28 DIAGNOSIS — Z79891 Long term (current) use of opiate analgesic: Secondary | ICD-10-CM | POA: Diagnosis not present

## 2014-05-28 DIAGNOSIS — I5032 Chronic diastolic (congestive) heart failure: Secondary | ICD-10-CM | POA: Diagnosis not present

## 2014-05-28 DIAGNOSIS — Z8674 Personal history of sudden cardiac arrest: Secondary | ICD-10-CM | POA: Insufficient documentation

## 2014-05-28 DIAGNOSIS — I10 Essential (primary) hypertension: Secondary | ICD-10-CM | POA: Insufficient documentation

## 2014-05-28 DIAGNOSIS — Z7902 Long term (current) use of antithrombotics/antiplatelets: Secondary | ICD-10-CM | POA: Insufficient documentation

## 2014-05-28 DIAGNOSIS — Z87891 Personal history of nicotine dependence: Secondary | ICD-10-CM | POA: Diagnosis not present

## 2014-05-28 DIAGNOSIS — Z7982 Long term (current) use of aspirin: Secondary | ICD-10-CM | POA: Insufficient documentation

## 2014-05-28 DIAGNOSIS — Z85528 Personal history of other malignant neoplasm of kidney: Secondary | ICD-10-CM | POA: Diagnosis not present

## 2014-05-28 DIAGNOSIS — I70212 Atherosclerosis of native arteries of extremities with intermittent claudication, left leg: Secondary | ICD-10-CM | POA: Diagnosis not present

## 2014-05-28 DIAGNOSIS — I255 Ischemic cardiomyopathy: Secondary | ICD-10-CM

## 2014-05-28 HISTORY — PX: ILIAC ARTERY STENT: SHX1786

## 2014-05-28 HISTORY — PX: LOWER EXTREMITY ANGIOGRAM: SHX5508

## 2014-05-28 LAB — POCT ACTIVATED CLOTTING TIME: Activated Clotting Time: 247 seconds

## 2014-05-28 SURGERY — ANGIOGRAM, LOWER EXTREMITY
Anesthesia: LOCAL

## 2014-05-28 MED ORDER — CARVEDILOL 12.5 MG PO TABS
12.5000 mg | ORAL_TABLET | Freq: Two times a day (BID) | ORAL | Status: DC
  Administered 2014-05-28: 19:00:00 12.5 mg via ORAL
  Filled 2014-05-28 (×3): qty 1

## 2014-05-28 MED ORDER — HYDROMORPHONE HCL 1 MG/ML IJ SOLN
INTRAMUSCULAR | Status: AC
  Filled 2014-05-28: qty 1

## 2014-05-28 MED ORDER — SODIUM CHLORIDE 0.9 % IV SOLN
INTRAVENOUS | Status: DC
  Administered 2014-05-28: 07:00:00 via INTRAVENOUS

## 2014-05-28 MED ORDER — ONDANSETRON HCL 4 MG/2ML IJ SOLN
4.0000 mg | Freq: Four times a day (QID) | INTRAMUSCULAR | Status: DC | PRN
Start: 2014-05-28 — End: 2014-05-29

## 2014-05-28 MED ORDER — ASPIRIN EC 81 MG PO TBEC
81.0000 mg | DELAYED_RELEASE_TABLET | Freq: Every day | ORAL | Status: DC
  Filled 2014-05-28: qty 1

## 2014-05-28 MED ORDER — VITAMIN B-6 100 MG PO TABS
100.0000 mg | ORAL_TABLET | Freq: Every day | ORAL | Status: DC
  Filled 2014-05-28: qty 1

## 2014-05-28 MED ORDER — PHENYTOIN SODIUM EXTENDED 100 MG PO CAPS
300.0000 mg | ORAL_CAPSULE | Freq: Every morning | ORAL | Status: DC
Start: 2014-05-29 — End: 2014-05-29
  Filled 2014-05-28: qty 3

## 2014-05-28 MED ORDER — MIDAZOLAM HCL 2 MG/2ML IJ SOLN
INTRAMUSCULAR | Status: AC
  Filled 2014-05-28: qty 2

## 2014-05-28 MED ORDER — SODIUM CHLORIDE 0.9 % IJ SOLN: 3.0000 mL | Freq: Two times a day (BID) | INTRAMUSCULAR | Status: DC

## 2014-05-28 MED ORDER — SODIUM CHLORIDE 0.9 % IV SOLN: 250.0000 mL | INTRAVENOUS | Status: DC | PRN

## 2014-05-28 MED ORDER — HEPARIN SODIUM (PORCINE) 1000 UNIT/ML IJ SOLN
INTRAMUSCULAR | Status: AC
  Filled 2014-05-28: qty 1

## 2014-05-28 MED ORDER — CARVEDILOL 6.25 MG PO TABS
6.2500 mg | ORAL_TABLET | Freq: Two times a day (BID) | ORAL | Status: DC
  Filled 2014-05-28 (×2): qty 1

## 2014-05-28 MED ORDER — LIDOCAINE HCL (PF) 1 % IJ SOLN
INTRAMUSCULAR | Status: AC
  Filled 2014-05-28: qty 30

## 2014-05-28 MED ORDER — ATORVASTATIN CALCIUM 20 MG PO TABS
20.0000 mg | ORAL_TABLET | Freq: Every day | ORAL | Status: DC
  Filled 2014-05-28: qty 1

## 2014-05-28 MED ORDER — LIDOCAINE-EPINEPHRINE 1 %-1:100000 IJ SOLN
INTRAMUSCULAR | Status: AC
  Filled 2014-05-28: qty 1

## 2014-05-28 MED ORDER — SODIUM CHLORIDE 0.9 % IJ SOLN: 3.0000 mL | INTRAMUSCULAR | Status: DC | PRN

## 2014-05-28 MED ORDER — PRASUGREL HCL 10 MG PO TABS
10.0000 mg | ORAL_TABLET | Freq: Every day | ORAL | Status: DC
Start: 2014-05-29 — End: 2014-05-29
  Filled 2014-05-28: qty 1

## 2014-05-28 MED ORDER — ACETAMINOPHEN 325 MG PO TABS: 650.0000 mg | ORAL_TABLET | ORAL | Status: DC | PRN

## 2014-05-28 MED ORDER — HEPARIN (PORCINE) IN NACL 2-0.9 UNIT/ML-% IJ SOLN
INTRAMUSCULAR | Status: AC
  Filled 2014-05-28: qty 1000

## 2014-05-28 MED ORDER — PANTOPRAZOLE SODIUM 20 MG PO TBEC
20.0000 mg | DELAYED_RELEASE_TABLET | Freq: Every day | ORAL | Status: DC
  Filled 2014-05-28 (×2): qty 1

## 2014-05-28 MED ORDER — LISINOPRIL 5 MG PO TABS
5.0000 mg | ORAL_TABLET | Freq: Every day | ORAL | Status: DC
Start: 2014-05-29 — End: 2014-05-29
  Administered 2014-05-29: 02:00:00 5 mg via ORAL
  Filled 2014-05-28 (×2): qty 1

## 2014-05-28 MED ORDER — PSYLLIUM 95 % PO PACK
1.0000 | PACK | Freq: Every day | ORAL | Status: DC
  Filled 2014-05-28 (×2): qty 1

## 2014-05-28 MED ORDER — SODIUM CHLORIDE 0.9 % IV SOLN: 1.0000 mL/kg/h | INTRAVENOUS | Status: AC

## 2014-05-28 NOTE — H&P (Signed)
  Please see office visit notes for complete details of HPI.  

## 2014-05-28 NOTE — CV Procedure (Signed)
Procedure performed is abdominal aortogram, crossover from the right into the left femoral artery, placement of the catheter tip in the left common femoral artery. PTA and stenting of the left external iliac artery with implantation of a 6.0 x 80 mm absolute Pro self-expanding stent followed by overlapping 8.0 x 80 mm absolute Pro self-expanding stent into the left common iliac artery. Closure of a right femoral arterial access with Perclose  Indication: Patient is a 63 year old Caucasian male with history of known coronary artery disease. During cardiac arrest about 6-8 months ago, he had Impella circulatory assist device placed into the left common femoral artery. 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 About 2 months ago he underwent peripheral arteriogram and at that time, I had introduced a 7 [place] sheath into the occluded iliac and femoral artery and due to dottering, he had established flow and his ABI had improved significantly. However he had recurrent restenosis clinically and developed lifestyle limiting claudication. He is now brought to the peripheral angiography suite to reevaluate his peripheral anatomy and for possible angina plasty.  Abdominal aortogram: The right common iliac artery and external iliac arteries are widely patent, with very minimal disease. Internal iliac arteries also patent. Left common iliac artery shows mild atherosclerotic changes with a 10-20% stenosis followed by a ulcerated stenosis and approximately 70-80%. There are tandem multiple stenosis throughout the common and external iliac artery and the left external iliac artery at the highest stenotic place is about 95% to 99%.  Intervention data: Successful PTA and stenting of the left external iliac artery with implantation of a 6.0 x 80 mm absolute Pro self-expanding stent followed by overlapping 8.0 x 80 mm absolute Pro self-expanding stent into the left common iliac artery. This was followed  by postdilatation with a 6.0 x 1 50 mm [place] balloon throughout the stented segment with a peak dilatation  at 6 atmospheric pressure for 60 seconds and into the unstented common femoral artery at 2 atmospheric pressure for 20 seconds. Stenosis reduced from 99% to 0% with brisk flow established throughout the iliac and femoral artery. A total of 90 mL of contrast was lysed for diagnostic and intervention procedure.  Technique: Using a 5 French right femoral arterial access a 5 French Omni Flush catheter was advanced into the abdominal aorta. A pelvic aortogram was performed. The same catheter was utilized to crossover from the right femoral artery to the left femoral artery over a Glidewire and I was able to cross the high-grade stenosis without any complication. I then advanced the Omni flush catheter into the left common femoral and into the left profunda femoris artery, followed by exchanging the Glidewire to a Versacore wire. Using heparin for" keeping his ACT greater than 200 seconds, I change the Omni Flush catheter to a 7 [place] Ansel sheath, 45 cm and placed Ansel sheath in the left common femoral artery. Femoral arteriogram was performed prior to withdrawal of the Omni Flush catheter.  I directly stented the left external iliac artery stenosis keeping the stent at the femoral head with a self-expanding stent followed by implantation of a second stent proximally into the left common iliac artery followed by postdilatation with the above stated balloon. Postprocedure results were excellent, I gently touched up the left common femoral artery with the same balloon at 2 atmospheric pressure for 20 seconds. There was no evidence of edge dissection, no evidence of thrombus, patient tolerated the procedure well. The wire was withdrawn, angiography repeated and the  sheath was pulled into the right external iliac artery and right femoral arterial gram was performed followed by closure of right femoral arterial  access with Perclose with excellent hemostasis. Patient tolerated the procedure. There was no immediate competition. Patient will be observed overnight and discharged in the morning due to social issues and to exclude any complication from the procedure including hematoma formation.

## 2014-05-28 NOTE — Care Management Note (Addendum)
  Page 1 of 1   05/28/2014     4:22:20 PM CARE MANAGEMENT NOTE 05/28/2014  Patient:  Gilbert [name],Gilbert [name]   Account Number:  [id number]  Date Initiated:  05/28/2014  Documentation initiated by:  Mariann Laster  Subjective/Objective Assessment:   PAD     Action/Plan:   CM to follow for disposition needs   Anticipated DC Date:  05/29/2014   Anticipated DC Plan:  [place]  CM consult  Medication Assistance      Choice offered to / List presented to:             Status of service:  Completed, signed off Medicare Important Message given?   (If response is "NO", the following Medicare IM given date fields will be blank) Date Medicare IM given:   Medicare IM given by:   Date Additional Medicare IM given:   Additional Medicare IM given by:    Discharge Disposition:  HOME/SELF CARE  Per UR Regulation:    If discussed at Long Length of Stay Meetings, dates discussed:    Comments:  Daisy Lites RN, BSN, MSHL, CCM  Nurse - Case Manager,  ([facility] [place])  [phone]  05/28/2014 Cardiac Cath 05/28/2014 Med Review:  prasugrel (EFFIENT) tablet 10 mg daily  - per [employer], medication is covered, tier 4, $70 co-pay at [facility];  Home / Self care.

## 2014-05-28 NOTE — Progress Notes (Signed)
1cc lido/epi injected in RFA tract per verbal order Dr Chrystine Oiler area:  groinSite Prior to Removal:  Level 0  Pressure Applied For 55 min   Minutes Beginning [id number]  Manual:   yes  Patient Status During Pull:  stable  Post Pull Groin Site:  Level 0  Post Pull Instructions Given:  yes  Post Pull Pulses Present:  yes Dressing Applied:  clear   Bedrest Begins:1015   Comments:  Mild bruising noted/soft to palpation

## 2014-05-28 NOTE — Interval H&P Note (Signed)
History and Physical Interval Note:  05/28/2014 7:49 AM  Gilbert [name]  has presented today for surgery, with the diagnosis of claudication  The various methods of treatment have been discussed with the patient and family. After consideration of risks, benefits and other options for treatment, the patient has consented to  Procedure(s): LOWER EXTREMITY ANGIOGRAM (N/A) and possible PTA as a surgical intervention .  The patient's history has been reviewed, patient examined, no change in status, stable for surgery.  I have reviewed the patient's chart and labs.  Questions were answered to the patient's satisfaction.     [name]

## 2014-05-29 DIAGNOSIS — I739 Peripheral vascular disease, unspecified: Secondary | ICD-10-CM

## 2014-05-29 DIAGNOSIS — I70212 Atherosclerosis of native arteries of extremities with intermittent claudication, left leg: Secondary | ICD-10-CM | POA: Diagnosis not present

## 2014-05-29 NOTE — Discharge Summary (Signed)
Physician Discharge Summary  Patient ID: Gilbert [name] MRN: [id number] DOB/AGE: [birth year] 63 y.o.  Admit date: 05/28/2014 Discharge date: 05/29/2014  Primary Discharge Diagnosis Claudication/PAD: 05/28/2014: stenting of the left external iliac artery with implantation of a 6.0 x 80 mm absolute Pro self-expanding stent followed by overlapping 8.0 x 80 mm absolute Pro self-expanding stent into the left common iliac artery. 99% to 0% CAD  Presented with C-arrest 11/26/2013: PTCA/Stenting of LM and Proximal LAD with 4.0 x 18 mm Xience alpine stent. Used Impella Circulatory assist device. EF 20-25%  Secondary Discharge Diagnosis Chronic diastolic heart failure (I20.35) Outpatient  Echocardiogram 04/10/2014: 1. Left ventricle cavity is normal in size. Moderate concentric hypertrophy of the left ventricle. Left ventricle regional wall motion findings: Mid anteroseptal, Mid inferoseptal, Apical anterior and Apical septal hypokinesis. Calculated EF 45%. 2. Left atrial cavity is mild to moderately dilated. 3. Compared to the study done on 02/21/2014, Calculated EF was 37%, visual EF is 25-30%. This represents an improvement in global LVEF. Hypertension Hyperlipidemia  Significant Diagnostic Studies: peripheral arteriogram as dictated above on 05/28/2014, successful revascularization with excellent results.  Hospital Course:  Patient was admitted electively for peripheral arteriogram and possible angioplasty.  His anatomy was felt to be feasible for angioplasty as that has been remodeling, patient underwent successful self-expanding stent implantation.  The following morning his groin site was stable without any hematoma, he had bounding pulses in the lower extremities, felt able for discharge.   Recommendations on discharge: patient will need outpatient lower extent arterial duplex to establish a baseline, continued medical therapy for CAD and secondary prevention.  Discharge Exam: Blood pressure  149/89, pulse 74, temperature 98.1 F (36.7 C), temperature source Oral, resp. rate 20, height 5' 10.5" (1.791 m), weight 80.4 kg (177 lb 4 oz), SpO2 94.00%.   General: Well built and normal body habitus who is in no acute distress. Appears older than stated age. Alert Ox3.   There is no cyanosis. HEENT: normal limits. , No JVD.   CARDIAC EXAM: S1, S2 normal, no gallop present. No murmur.   CHEST EXAM: No Tenderness LUNGS: Clear to percuss and auscultate.  ABDOMEN: No hepatosplenomegaly. BS normal in all 4 quadrants. Abdomen is non-tender.   EXTREMITY: Full range of movementes, No edema. Bounding bilateral lower extremity pulses, right groin without hematoma. Labs:   Lab Results  Component Value Date   WBC 7.9 05/09/2014   HGB 12.5* 05/09/2014   HCT 35.8* 05/09/2014   MCV 84 05/09/2014   PLT 134* 05/09/2014   No results found for this basename: NA, K, CL, CO2, BUN, CREATININE, CALCIUM, LABALBU, PROT, BILITOT, ALKPHOS, ALT, AST, GLUCOSE,  in the last 168 hours Lab Results  Component Value Date   TROPONINI >20.00* 11/29/2013    Lipid Panel     Component Value Date/Time   CHOL 137 11/30/2013 0435   TRIG 167* 11/30/2013 0435   HDL 30* 11/30/2013 0435   CHOLHDL 4.6 11/30/2013 0435   VLDL 33 11/30/2013 0435   LDLCALC 74 11/30/2013 0435    Radiology:  Ct Chest W Contrast  05/09/2014   CLINICAL DATA:  History of renal cancer.  Evaluate for recurrence. Healing sternal fracture. Multiple healing rib fractures bilaterally.  IMPRESSION: 1. There is a new 3 mm left lower lobe pulmonary nodule. Additional adjacent 5 mm left lower lobe pulmonary nodule is stable. While these are likely infectious or inflammatory in etiology, attention on followup is recommended. 2. Status post right nephrectomy without evidence for locally recurrent or  diffusely metastatic disease.   Electronically Signed   By: Lovey Newcomer M.D.   On: 05/09/2014 14:09      FOLLOW UP PLANS AND APPOINTMENTS    Medication List          aspirin EC 81 MG tablet  Take 1 tablet (81 mg total) by mouth daily.     atorvastatin 20 MG tablet  Commonly known as:  LIPITOR  Take 20 mg by mouth daily.     B-12 5000 MCG Caps  Take 5,000 mcg by mouth daily.     carvedilol 6.25 MG tablet  Commonly known as:  COREG  Take 6.25 mg by mouth 2 (two) times daily with a meal.     lansoprazole 30 MG capsule  Commonly known as:  PREVACID  Take 30 mg by mouth daily.     lisinopril 5 MG tablet  Commonly known as:  PRINIVIL,ZESTRIL  Take 5 mg by mouth daily.     METAMUCIL PO  Take 3 capsules by mouth daily.     phenytoin 300 MG ER capsule  Commonly known as:  DILANTIN  Take 1 capsule (300 mg total) by mouth every morning.     prasugrel 10 MG Tabs tablet  Commonly known as:  EFFIENT  Take 10 mg by mouth daily.     pyridOXINE 100 MG tablet  Commonly known as:  VITAMIN B-6  Take 100 mg by mouth daily.     trolamine salicylate 10 % cream  Commonly known as:  ASPERCREME  Apply 1 application topically as needed for muscle pain.     vitamin B-1 250 MG tablet  Take 250 mg by mouth daily.           Follow-up Information   Follow up with [name], MD. (Keep previous appointment)    Specialty:  Cardiology   Contact information:   [address] [place] [address] [phone]        [name], MD 05/29/2014, 10:45 AM  Pager: [phone] Office: [phone] If no answer: [phone]

## 2014-06-06 ENCOUNTER — Ambulatory Visit: Payer: BC Managed Care – PPO

## 2014-07-01 ENCOUNTER — Other Ambulatory Visit: Payer: Self-pay | Admitting: Emergency Medicine

## 2014-07-01 ENCOUNTER — Other Ambulatory Visit: Payer: Self-pay | Admitting: *Deleted

## 2014-07-02 NOTE — Telephone Encounter (Signed)
Dr Everlene Farrier, I don't see this Rx exactly on med list (phenytoin is on list). Pt last had check up with you in July but don't see seizures discussed at that [facility]. Do you want to RF?

## 2014-07-03 NOTE — Telephone Encounter (Signed)
Informed pharmacy that it is inappropriate for our clinic to prescribe medication not relevant to our practice

## 2014-07-05 ENCOUNTER — Other Ambulatory Visit: Payer: Self-pay

## 2014-07-05 MED ORDER — CARVEDILOL 6.25 MG PO TABS: 6.2500 mg | ORAL_TABLET | Freq: Two times a day (BID) | ORAL | Status: DC

## 2014-07-11 ENCOUNTER — Encounter (HOSPITAL_COMMUNITY): Payer: Self-pay | Admitting: Cardiology

## 2014-07-25 ENCOUNTER — Other Ambulatory Visit: Payer: Self-pay | Admitting: Emergency Medicine

## 2014-07-31 ENCOUNTER — Ambulatory Visit (INDEPENDENT_AMBULATORY_CARE_PROVIDER_SITE_OTHER): Payer: BC Managed Care – PPO

## 2014-07-31 ENCOUNTER — Telehealth: Payer: Self-pay | Admitting: *Deleted

## 2014-07-31 NOTE — Telephone Encounter (Signed)
Called and LM on both home number and daughter number to find out why pt came in today and to see if pt would like to come back in to see Dr. Everlene Farrier on Monday. Sooner if this was a urgent matter.

## 2014-08-13 ENCOUNTER — Other Ambulatory Visit: Payer: Self-pay | Admitting: Emergency Medicine

## 2014-08-13 ENCOUNTER — Encounter: Payer: Self-pay | Admitting: Emergency Medicine

## 2014-08-13 ENCOUNTER — Ambulatory Visit (INDEPENDENT_AMBULATORY_CARE_PROVIDER_SITE_OTHER): Payer: 59 | Admitting: Emergency Medicine

## 2014-08-13 ENCOUNTER — Ambulatory Visit (INDEPENDENT_AMBULATORY_CARE_PROVIDER_SITE_OTHER): Payer: 59

## 2014-08-13 VITALS — BP 120/60 | HR 60 | Temp 97.7°F | Resp 16 | Ht 70.25 in | Wt 179.0 lb

## 2014-08-13 DIAGNOSIS — E785 Hyperlipidemia, unspecified: Secondary | ICD-10-CM

## 2014-08-13 DIAGNOSIS — Z23 Encounter for immunization: Secondary | ICD-10-CM

## 2014-08-13 DIAGNOSIS — I2584 Coronary atherosclerosis due to calcified coronary lesion: Secondary | ICD-10-CM

## 2014-08-13 DIAGNOSIS — M25511 Pain in right shoulder: Secondary | ICD-10-CM

## 2014-08-13 DIAGNOSIS — I2510 Atherosclerotic heart disease of native coronary artery without angina pectoris: Secondary | ICD-10-CM

## 2014-08-13 DIAGNOSIS — C649 Malignant neoplasm of unspecified kidney, except renal pelvis: Secondary | ICD-10-CM

## 2014-08-13 DIAGNOSIS — I10 Essential (primary) hypertension: Secondary | ICD-10-CM

## 2014-08-13 LAB — CBC
HEMATOCRIT: 37.4 % — AB (ref 39.0–52.0)
Hemoglobin: 13.1 g/dL (ref 13.0–17.0)
MCH: 28.7 pg (ref 26.0–34.0)
MCHC: 35 g/dL (ref 30.0–36.0)
MCV: 81.8 fL (ref 78.0–100.0)
MPV: 11.2 fL (ref 8.6–12.4)
Platelets: 169 10*3/uL (ref 150–400)
RBC: 4.57 MIL/uL (ref 4.22–5.81)
RDW: 13.2 % (ref 11.5–15.5)
WBC: 9.3 10*3/uL (ref 4.0–10.5)

## 2014-08-13 MED ORDER — CARVEDILOL 6.25 MG PO TABS: 6.2500 mg | ORAL_TABLET | Freq: Two times a day (BID) | ORAL | Status: DC

## 2014-08-13 MED ORDER — ATORVASTATIN CALCIUM 20 MG PO TABS: 20.0000 mg | ORAL_TABLET | Freq: Every day | ORAL | Status: DC

## 2014-08-13 MED ORDER — PRASUGREL HCL 10 MG PO TABS: 10.0000 mg | ORAL_TABLET | Freq: Every day | ORAL | Status: DC

## 2014-08-13 MED ORDER — LANSOPRAZOLE 30 MG PO CPDR: 30.0000 mg | DELAYED_RELEASE_CAPSULE | Freq: Every day | ORAL | Status: DC

## 2014-08-13 MED ORDER — PHENYTEK 300 MG PO CAPS: 300.0000 mg | ORAL_CAPSULE | Freq: Every morning | ORAL | Status: DC

## 2014-08-13 NOTE — Progress Notes (Addendum)
Subjective:  This chart was scribed for [name], MD by Ladene Artist, ED Scribe. The patient was seen in room 22. Patient's care was started at 10:45 AM.   Patient ID: Gilbert [name], male    DOB: [birth year], 64 y.o.   MRN: [id number]  Chief Complaint  Patient presents with  . Shoulder pain    L X 1 month  . Knee Pain  . Gastrophageal Reflux  . Medication Refill   HPI  HPI Comments: Gilbert [name] is a 64 y.o. male, with a h/o CAD, cardiac arrest, PAD, CHF, HTN, hyperlipidemia, hypercholesteremia, epilepsy, CA, who presents to the [facility] complaining of constant, gradually worsening L shoulder pain onset 1 month ago. Pain is exacerbated with movement. Pt suspects that pain is attributed to arthritis. Pt reports similar pain in the past in his R shoulder which resolved with an injection in the summer of 2015.   R Knee Pain Pt states that he has been experiencing intermittent R knee pain over the past few days. Pt reports pain only at night.   Leg Pain Pt reports that L leg pain has resolved since stent placement. He still reports associated mild numbness and a cold sensation to L leg. He denies associated pain.   Cardiology Pt suffered cardiac arrest at home on 11/26/13. Pt has an upcoming appointment with Dr. Einar Gip.   Immunizations Pt does not desire a flu, pneumonia or shingles vaccine at this time.  Past Medical History  Diagnosis Date  . Gout   . GERD (gastroesophageal reflux disease)   . Hypertension   . Hypercholesteremia   . Shortness of breath     with activity  . History of epilepsy     at least 10 years ago. 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 Family history of anesthesia complication     mother-had stroke during anesthesia  . HTN (hypertension)   . Seizures   . History of myocardial infarction less than 8 weeks     Stent placed  . CAD (coronary artery disease)   . PAD (peripheral artery disease)   . Hyperlipemia   . Therapeutic drug  monitoring   . Coronary artery calcification seen on CAT scan   . SOB (shortness of breath)   . History of tobacco use   . Systolic and diastolic CHF, chronic     Resolved. EF 45% 04/10/2014  . Essential hypertension, benign   . History of nephrectomy 11/2013    For renal cell carcinoma  . Acid reflux   . Abnormal liver enzymes     Chronic alkaline phosphatase elevation since 2014  . History of cardiac arrest 11/26/13    PTCA/Stenting of LM & Prox LAD with 4.0 x 18 mm Xience alpine stent. Used Impella Circulatory assist device. EF= 20-25%  . Coronary atherosclerosis of native coronary artery   . Cancer     right kidney cancer  . Cancer    Current Outpatient Prescriptions on File Prior to Visit  Medication Sig Dispense Refill  . aspirin EC 81 MG tablet Take 1 tablet (81 mg total) by mouth daily.    Marland Kitchen atorvastatin (LIPITOR) 20 MG tablet Take 20 mg by mouth daily.    . carvedilol (COREG) 6.25 MG tablet Take 1 tablet (6.25 mg total) by mouth 2 (two) times daily with a meal. 60 tablet 2  . Cyanocobalamin (B-12) 5000 MCG CAPS Take 5,000 mcg by mouth daily.     . lansoprazole (PREVACID) 30  MG capsule Take 30 mg by mouth daily.    . lansoprazole (PREVACID) 30 MG capsule TAKE ONE CAPSULE BY MOUTH ONCE DAILY 30 capsule 0  . lisinopril (PRINIVIL,ZESTRIL) 5 MG tablet Take 5 mg by mouth daily.    Marland Kitchen PHENYTEK 300 MG ER capsule TAKE ONE CAPSULE BY MOUTH IN THE MORNING 30 capsule 0  . prasugrel (EFFIENT) 10 MG TABS tablet Take 10 mg by mouth daily.    . Psyllium (METAMUCIL PO) Take 3 capsules by mouth daily.    Marland Kitchen pyridOXINE (VITAMIN B-6) 100 MG tablet Take 100 mg by mouth daily.    . Thiamine HCl (VITAMIN B-1) 250 MG tablet Take 250 mg by mouth daily.    Marland Kitchen trolamine salicylate (ASPERCREME) 10 % cream Apply 1 application topically as needed for muscle pain.     No current facility-administered medications on file prior to visit.   Allergies  Allergen Reactions  . Oxycodone Swelling and Other (See  Comments)    Tongue and lip   Review of Systems  Constitutional: Negative for fever, chills and fatigue.  Musculoskeletal: Positive for arthralgias. Negative for myalgias.  Neurological: Positive for numbness (chronic).      Objective:   Physical Exam CONSTITUTIONAL: Well developed/well nourished HEAD: Normocephalic/atraumatic EYES: EOMI/PERRL ENMT: Mucous membranes moist NECK: supple no meningeal signs SPINE/BACK:entire spine nontender CV: S1/S2 noted, no murmurs/rubs/gallops noted LUNGS: Lungs are clear to auscultation bilaterally, no apparent distress ABDOMEN: soft, nontender, no rebound or guarding, bowel sounds noted throughout abdomen GU:no cva tenderness NEURO: Pt is awake/alert/appropriate, moves all extremitiesx4.  No facial droop.   EXTREMITIES: pulses normal/equal, full ROM, tenderness over both shoulders with pain on abduction of both shoulders but worse on the L SKIN: warm, color normal PSYCH: no abnormalities of mood noted, alert and oriented to situation   [facility] reading (PRIMARY) by  Dr. Everlene Farrier there is arthritic change at the left before meals joint with mild arthritic change around theglenoid no other abnormality seen   Assessment & Plan:  Patient looks great. His meds were refilled. Referral made to orthopedics for evaluation of his shoulder discomfort which appears to be mostly in the before meals joint. Referral made to Dr. Tresa Moore for evaluation and follow-up of his clear cell renal cancer. Referral made to Dr. Einar Gip to continue follow-up of his coronary disease. and  peripheral arterial disease.I personally performed the services described in this documentation, which was scribed in my presence. The recorded information has been reviewed and is accurate.

## 2014-08-14 LAB — COMPLETE METABOLIC PANEL WITH GFR
ALT: 10 U/L (ref 0–53)
AST: 18 U/L (ref 0–37)
Albumin: 3.7 g/dL (ref 3.5–5.2)
Alkaline Phosphatase: 189 U/L — ABNORMAL HIGH (ref 39–117)
BILIRUBIN TOTAL: 0.5 mg/dL (ref 0.2–1.2)
BUN: 20 mg/dL (ref 6–23)
CHLORIDE: 105 meq/L (ref 96–112)
CO2: 21 meq/L (ref 19–32)
CREATININE: 1.15 mg/dL (ref 0.50–1.35)
Calcium: 8.9 mg/dL (ref 8.4–10.5)
GFR, EST AFRICAN AMERICAN: 78 mL/min
GFR, EST NON AFRICAN AMERICAN: 67 mL/min
GLUCOSE: 90 mg/dL (ref 70–99)
Potassium: 4.4 mEq/L (ref 3.5–5.3)
Sodium: 136 mEq/L (ref 135–145)
Total Protein: 6.7 g/dL (ref 6.0–8.3)

## 2014-08-14 LAB — LIPID PANEL
Cholesterol: 184 mg/dL (ref 0–200)
HDL: 50 mg/dL (ref >39)
LDL Cholesterol: 110 mg/dL — ABNORMAL HIGH (ref 0–99)
Total CHOL/HDL Ratio: 3.7 Ratio
Triglycerides: 120 mg/dL (ref <150)
VLDL: 24 mg/dL (ref 0–40)

## 2014-08-14 LAB — PHENYTOIN LEVEL, TOTAL: PHENYTOIN LVL: 6.8 ug/mL — AB (ref 10.0–20.0)

## 2014-08-15 LAB — HEMOGLOBIN A1C
Hgb A1c MFr Bld: 5.1 % (ref <5.7)
Mean Plasma Glucose: 100 mg/dL (ref <117)

## 2014-08-15 MED ORDER — METFORMIN HCL 500 MG PO TABS: 500.0000 mg | ORAL_TABLET | Freq: Every day | ORAL | Status: DC

## 2014-08-15 NOTE — Addendum Note (Signed)
Addended by: Constance Goltz on: 08/15/2014 12:03 PM   Modules accepted: Orders, SmartSet

## 2014-08-19 LAB — PHENYTOIN LEVEL, FREE AND TOTAL
Phenytoin Bound: 5.9 mg/L
Phenytoin, Free: 0.5 mg/L — ABNORMAL LOW (ref 1.0–2.0)
Phenytoin, Total: 6.4 mg/L — ABNORMAL LOW (ref 10.0–20.0)

## 2014-08-21 LAB — ALKALINE PHOSPHATASE ISOENZYMES
Alkaline Phonsphatase: 205 U/L — ABNORMAL HIGH (ref 40–115)
Bone Isoenzymes: 26 % — ABNORMAL LOW (ref 28–66)
Intestinal Isoenzymes: 0 % — ABNORMAL LOW (ref 1–24)
Liver Isoenzymes: 74 % — ABNORMAL HIGH (ref 25–69)
Macrohepatic isoenzymes: 0 %

## 2014-09-05 ENCOUNTER — Encounter: Payer: Self-pay | Admitting: Emergency Medicine

## 2014-09-05 ENCOUNTER — Other Ambulatory Visit: Payer: Self-pay | Admitting: Emergency Medicine

## 2014-09-05 DIAGNOSIS — C649 Malignant neoplasm of unspecified kidney, except renal pelvis: Secondary | ICD-10-CM

## 2014-09-07 ENCOUNTER — Other Ambulatory Visit: Payer: Self-pay | Admitting: Emergency Medicine

## 2014-09-11 ENCOUNTER — Ambulatory Visit: Payer: BC Managed Care – PPO | Admitting: Hematology & Oncology

## 2014-09-11 ENCOUNTER — Other Ambulatory Visit: Payer: BC Managed Care – PPO | Admitting: Lab

## 2014-09-12 ENCOUNTER — Ambulatory Visit (HOSPITAL_BASED_OUTPATIENT_CLINIC_OR_DEPARTMENT_OTHER): Payer: 59

## 2014-09-12 ENCOUNTER — Encounter: Payer: Self-pay | Admitting: Hematology & Oncology

## 2014-09-12 ENCOUNTER — Encounter (HOSPITAL_BASED_OUTPATIENT_CLINIC_OR_DEPARTMENT_OTHER): Payer: Self-pay

## 2014-09-12 ENCOUNTER — Ambulatory Visit (HOSPITAL_BASED_OUTPATIENT_CLINIC_OR_DEPARTMENT_OTHER): Payer: 59 | Admitting: Hematology & Oncology

## 2014-09-12 ENCOUNTER — Ambulatory Visit (HOSPITAL_BASED_OUTPATIENT_CLINIC_OR_DEPARTMENT_OTHER)
Admission: RE | Admit: 2014-09-12 | Discharge: 2014-09-12 | Disposition: A | Discharge status: Home / Self Care | Payer: 59 | Source: Ambulatory Visit | Attending: Family | Admitting: Family

## 2014-09-12 ENCOUNTER — Other Ambulatory Visit (HOSPITAL_BASED_OUTPATIENT_CLINIC_OR_DEPARTMENT_OTHER): Payer: 59 | Admitting: Lab

## 2014-09-12 VITALS — BP 159/79 | HR 66 | Temp 97.8°F | Resp 20 | Ht 69.0 in | Wt 178.0 lb

## 2014-09-12 DIAGNOSIS — C641 Malignant neoplasm of right kidney, except renal pelvis: Secondary | ICD-10-CM

## 2014-09-12 DIAGNOSIS — K5730 Diverticulosis of large intestine without perforation or abscess without bleeding: Secondary | ICD-10-CM | POA: Insufficient documentation

## 2014-09-12 DIAGNOSIS — J432 Centrilobular emphysema: Secondary | ICD-10-CM | POA: Diagnosis not present

## 2014-09-12 DIAGNOSIS — I700 Atherosclerosis of aorta: Secondary | ICD-10-CM | POA: Diagnosis not present

## 2014-09-12 DIAGNOSIS — R911 Solitary pulmonary nodule: Secondary | ICD-10-CM | POA: Diagnosis not present

## 2014-09-12 DIAGNOSIS — Z85828 Personal history of other malignant neoplasm of skin: Secondary | ICD-10-CM

## 2014-09-12 DIAGNOSIS — Z85528 Personal history of other malignant neoplasm of kidney: Secondary | ICD-10-CM

## 2014-09-12 LAB — CMP (CANCER CENTER ONLY)
ALBUMIN: 3.3 g/dL (ref 3.3–5.5)
ALK PHOS: 136 U/L — AB (ref 26–84)
ALT(SGPT): 14 U/L (ref 10–47)
AST: 25 U/L (ref 11–38)
BUN, Bld: 21 mg/dL (ref 7–22)
CO2: 25 mEq/L (ref 18–33)
Calcium: 9.3 mg/dL (ref 8.0–10.3)
Chloride: 104 mEq/L (ref 98–108)
Creat: 1.2 mg/dl (ref 0.6–1.2)
GLUCOSE: 88 mg/dL (ref 73–118)
POTASSIUM: 4 meq/L (ref 3.3–4.7)
SODIUM: 140 meq/L (ref 128–145)
Total Bilirubin: 0.5 mg/dl (ref 0.20–1.60)
Total Protein: 6.8 g/dL (ref 6.4–8.1)

## 2014-09-12 LAB — CBC WITH DIFFERENTIAL (CANCER CENTER ONLY)
BASO#: 0 10*3/uL (ref 0.0–0.2)
BASO%: 0.4 % (ref 0.0–2.0)
EOS ABS: 0.7 10*3/uL — AB (ref 0.0–0.5)
EOS%: 7.1 % — ABNORMAL HIGH (ref 0.0–7.0)
HEMATOCRIT: 36.7 % — AB (ref 38.7–49.9)
HGB: 12.9 g/dL — ABNORMAL LOW (ref 13.0–17.1)
LYMPH#: 2 10*3/uL (ref 0.9–3.3)
LYMPH%: 21.4 % (ref 14.0–48.0)
MCH: 29.4 pg (ref 28.0–33.4)
MCHC: 35.1 g/dL (ref 32.0–35.9)
MCV: 84 fL (ref 82–98)
MONO#: 0.5 10*3/uL (ref 0.1–0.9)
MONO%: 5.7 % (ref 0.0–13.0)
NEUT#: 6 10*3/uL (ref 1.5–6.5)
NEUT%: 65.4 % (ref 40.0–80.0)
Platelets: 144 10*3/uL — ABNORMAL LOW (ref 145–400)
RBC: 4.39 10*6/uL (ref 4.20–5.70)
RDW: 13.4 % (ref 11.1–15.7)
WBC: 9.1 10*3/uL (ref 4.0–10.0)

## 2014-09-12 LAB — LACTATE DEHYDROGENASE: LDH: 125 U/L (ref 94–250)

## 2014-09-12 LAB — CEA: CEA: 1.2 ng/mL (ref 0.0–5.0)

## 2014-09-12 MED ORDER — IOHEXOL 300 MG/ML  SOLN
100.0000 mL | Freq: Once | INTRAMUSCULAR | Status: AC | PRN
  Administered 2014-09-12: 100 mL via INTRAVENOUS

## 2014-09-12 NOTE — Progress Notes (Signed)
Hematology and Oncology Follow Up Visit  Gilbert [name] [id number] 1950/11/17 64 y.o. 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  Gilbert [name] is back for followup. He is doing pretty well. He had no proms over the holidays.  He's gone through his cardiac issue. He is on aspirin. He is on Lipitor.  We did go ahead and get a CT scan on him today. This was for restaging. Thankfully, the CT scan did not show any evidence of recurrent disease. He does have a stable 5 mm left lower lobe pulmonary nodule. Otherwise, everything else looks good.  He's had a little bit of a cough. This is a dry cough.  His appetite is okay. There's been no change in bowel or bladder habits.  There has been no leg swelling. He has had no rashes.  He's had no headache.  His overall performance status is ECOG 0.  Medications:  Current outpatient prescriptions:  .  aspirin EC 81 MG tablet, Take 1 tablet (81 mg total) by mouth daily., Disp: , Rfl:  .  atorvastatin (LIPITOR) 20 MG tablet, Take 1 tablet (20 mg total) by mouth daily., Disp: 30 tablet, Rfl: 11 .  carvedilol (COREG) 6.25 MG tablet, Take 1 tablet (6.25 mg total) by mouth 2 (two) times daily with a meal., Disp: 60 tablet, Rfl: 11 .  Cyanocobalamin (B-12) 5000 MCG CAPS, Take 5,000 mcg by mouth daily. , Disp: , Rfl:  .  lansoprazole (PREVACID) 30 MG capsule, TAKE ONE CAPSULE BY MOUTH ONCE DAILY, Disp: 30 capsule, Rfl: 0 .  lansoprazole (PREVACID) 30 MG capsule, Take 1 capsule (30 mg total) by mouth daily., Disp: 30 capsule, Rfl: 11 .  lisinopril (PRINIVIL,ZESTRIL) 5 MG tablet, TAKE ONE TABLET BY MOUTH ONCE DAILY, Disp: 30 tablet, Rfl: 4 .  metFORMIN (GLUCOPHAGE) 500 MG tablet, Take 1 tablet (500 mg total) by mouth daily with breakfast., Disp: 30 tablet, Rfl: 1 .  PHENYTEK 300 MG ER capsule, Take 1 capsule (300 mg total) by mouth every morning., Disp: 30  capsule, Rfl: 11 .  prasugrel (EFFIENT) 10 MG TABS tablet, Take 1 tablet (10 mg total) by mouth daily., Disp: 30 tablet, Rfl: 11 .  Psyllium (METAMUCIL PO), Take 3 capsules by mouth daily., Disp: , Rfl:  .  pyridOXINE (VITAMIN B-6) 100 MG tablet, Take 100 mg by mouth daily., Disp: , Rfl:  .  Thiamine HCl (VITAMIN B-1) 250 MG tablet, Take 250 mg by mouth daily., Disp: , Rfl:   Allergies:  Allergies  Allergen Reactions  . Oxycodone Swelling and Other (See Comments)    Tongue and lip    Past Medical History, Surgical history, Social history, and Family History were reviewed and updated.  Review of Systems: As above  Physical Exam:  height is 5\' 9"  (1.753 m) and weight is 178 lb (80.74 kg). His oral temperature is 97.8 F (36.6 C). His blood pressure is 159/79 and his pulse is 66. His respiration is 20.   Well-developed and well-nourished white male. Head and neck exam shows no ocular or oral lesions. He has no adenopathy in the neck. Lungs are clear. Cardiac exam regular rate and rhythm with no murmurs rubs or bruits. Abdomen is soft. He has laparoscopy scars that are well-healed from his right nephrectomy. There is no fluid wave. There is no palpable liver or spleen tip. Back exam shows no tenderness over the spine  ribs or hips. Extremities shows no clubbing cyanosis or edema. Skin exam no rashes, ecchymoses or petechia. Neurological exam is nonfocal.  Lab Results  Component Value Date   WBC 9.1 09/12/2014   HGB 12.9* 09/12/2014   HCT 36.7* 09/12/2014   MCV 84 09/12/2014   PLT 144* 09/12/2014     Chemistry      Component Value Date/Time   NA 140 09/12/2014 0949   NA 136 08/13/2014 1113   K 4.0 09/12/2014 0949   K 4.4 08/13/2014 1113   CL 104 09/12/2014 0949   CL 105 08/13/2014 1113   CO2 25 09/12/2014 0949   CO2 21 08/13/2014 1113   BUN 21 09/12/2014 0949   BUN 20 08/13/2014 1113   CREATININE 1.2 09/12/2014 0949   CREATININE 1.18 12/10/2013 0651      Component Value  Date/Time   CALCIUM 9.3 09/12/2014 0949   CALCIUM 8.9 08/13/2014 1113   ALKPHOS 136* 09/12/2014 0949   ALKPHOS 189* 08/13/2014 1113   AST 25 09/12/2014 0949   AST 18 08/13/2014 1113   ALT 14 09/12/2014 0949   ALT 10 08/13/2014 1113   BILITOT 0.50 09/12/2014 0949   BILITOT 0.5 08/13/2014 1113         Impression and Plan: [name] is 64 year old gentleman with stage III clear cell cancer of the right kidney. He underwent laparoscopic nephrectomy in March of 2015. His scan today does not show any evidence of recurrent disease.  I think that we can move his appointment out to 6 months now. We will get another scan on him we see him back.   Volanda Napoleon, MD 2/11/20161:42 PM

## 2014-09-26 ENCOUNTER — Encounter: Payer: Self-pay | Admitting: Emergency Medicine

## 2014-09-26 ENCOUNTER — Ambulatory Visit (INDEPENDENT_AMBULATORY_CARE_PROVIDER_SITE_OTHER): Payer: 59 | Admitting: Emergency Medicine

## 2014-09-26 VITALS — BP 144/73 | HR 63 | Temp 98.7°F | Resp 18 | Wt 178.0 lb

## 2014-09-26 DIAGNOSIS — R569 Unspecified convulsions: Secondary | ICD-10-CM

## 2014-09-26 DIAGNOSIS — I10 Essential (primary) hypertension: Secondary | ICD-10-CM

## 2014-09-26 DIAGNOSIS — M79671 Pain in right foot: Secondary | ICD-10-CM

## 2014-09-26 NOTE — Patient Instructions (Signed)
Stop metformen

## 2014-09-26 NOTE — Progress Notes (Addendum)
   Subjective:    Patient ID: Gilbert [name], male    DOB: 1950-12-12, 64 y.o.   MRN: [id number] This chart was scribed for [name], MD by Zola Button, Medical Scribe. This patient was seen in room 21 and the patient's care was started at 9:42 AM.   HPI HPI Comments: Gilbert [name] is a 64 y.o. male with a hx of HTN, gout, renal cancer, acute encephalopathy, PAD, cardiac arrest, coronary artery calcification and ischemic cardiomyopathy who presents to the [facility] for a follow-up. Patient states he has been doing well. He does a lot of walking for exercise and also plays golf. He states he has an upcoming appointment with his cardiologist. Patient denies CP. He quit smoking 11 years ago and does not drink EtOH.  Oncologist: Dr. Marin Olp  Review of Systems  Cardiovascular: Negative for chest pain.       Objective:   Physical Exam CONSTITUTIONAL: Well developed/well nourished HEAD: Normocephalic/atraumatic EYES: EOM/PERRL ENMT: Mucous membranes moist NECK: supple no meningeal signs SPINE: entire spine nontender CV: S1/S2 noted, no murmurs/rubs/gallops noted LUNGS: Lungs are clear to auscultation bilaterally, no apparent distress ABDOMEN: soft, nontender, no rebound or guarding GU: no cva tenderness NEURO: Pt is awake/alert, moves all extremitiesx4 EXTREMITIES: pulses normal, full ROM SKIN: warm, color normal PSYCH: no abnormalities of mood noted        Assessment & Plan:  He has had no problems with his gout. There have been no signs of recurrence of his renal cancer. His last hemoglobin A1c was 5.1 we'll go ahead and stop his Glucophage.I personally performed the services described in this documentation, which was scribed in my presence. The recorded information has been reviewed and is accurate. He refused any type of immunizations

## 2014-10-28 ENCOUNTER — Encounter: Payer: Self-pay | Admitting: Emergency Medicine

## 2014-11-15 ENCOUNTER — Ambulatory Visit (INDEPENDENT_AMBULATORY_CARE_PROVIDER_SITE_OTHER): Payer: 59

## 2014-11-15 ENCOUNTER — Ambulatory Visit (INDEPENDENT_AMBULATORY_CARE_PROVIDER_SITE_OTHER): Payer: 59 | Admitting: Emergency Medicine

## 2014-11-15 VITALS — BP 140/90 | HR 65 | Temp 98.2°F | Resp 16 | Ht 70.0 in | Wt 174.0 lb

## 2014-11-15 DIAGNOSIS — R0789 Other chest pain: Secondary | ICD-10-CM

## 2014-11-15 DIAGNOSIS — R0600 Dyspnea, unspecified: Secondary | ICD-10-CM | POA: Diagnosis not present

## 2014-11-15 DIAGNOSIS — R0602 Shortness of breath: Secondary | ICD-10-CM
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
## 2014-11-15 NOTE — Addendum Note (Signed)
Addended by: [name] A on: 11/15/2014 12:29 PM   Modules accepted: Level of Service

## 2014-11-22 ENCOUNTER — Telehealth: Payer: Self-pay | Admitting: Family Medicine

## 2014-11-22 NOTE — Telephone Encounter (Signed)
lmom to call back and reschedule his appt to [facility] 3.2016

## 2014-11-29 ENCOUNTER — Emergency Department (HOSPITAL_COMMUNITY): Payer: 59

## 2014-11-29 ENCOUNTER — Emergency Department (HOSPITAL_COMMUNITY)
Admission: EM | Admit: 2014-11-29 | Discharge: 2014-11-29 | Disposition: A | Discharge status: Home / Self Care | Payer: 59 | Attending: Emergency Medicine | Admitting: Emergency Medicine

## 2014-11-29 ENCOUNTER — Ambulatory Visit (INDEPENDENT_AMBULATORY_CARE_PROVIDER_SITE_OTHER): Payer: 59 | Admitting: Emergency Medicine

## 2014-11-29 ENCOUNTER — Encounter (HOSPITAL_COMMUNITY): Payer: Self-pay | Admitting: Emergency Medicine

## 2014-11-29 ENCOUNTER — Other Ambulatory Visit: Payer: Self-pay

## 2014-11-29 ENCOUNTER — Encounter: Payer: Self-pay | Admitting: Emergency Medicine

## 2014-11-29 VITALS — BP 152/70 | HR 80 | Temp 99.1°F | Resp 16

## 2014-11-29 DIAGNOSIS — Z7982 Long term (current) use of aspirin: Secondary | ICD-10-CM | POA: Insufficient documentation

## 2014-11-29 DIAGNOSIS — Z905 Acquired absence of kidney: Secondary | ICD-10-CM | POA: Diagnosis not present

## 2014-11-29 DIAGNOSIS — G40909 Epilepsy, unspecified, not intractable, without status epilepticus: Secondary | ICD-10-CM | POA: Diagnosis not present

## 2014-11-29 DIAGNOSIS — Z8739 Personal history of other diseases of the musculoskeletal system and connective tissue: Secondary | ICD-10-CM | POA: Diagnosis not present

## 2014-11-29 DIAGNOSIS — Z8719 Personal history of other diseases of the digestive system: Secondary | ICD-10-CM | POA: Diagnosis not present

## 2014-11-29 DIAGNOSIS — Z7902 Long term (current) use of antithrombotics/antiplatelets: Secondary | ICD-10-CM | POA: Diagnosis not present

## 2014-11-29 DIAGNOSIS — E785 Hyperlipidemia, unspecified: Secondary | ICD-10-CM | POA: Insufficient documentation

## 2014-11-29 DIAGNOSIS — R079 Chest pain, unspecified: Secondary | ICD-10-CM | POA: Diagnosis present

## 2014-11-29 DIAGNOSIS — Z9889 Other specified postprocedural states: Secondary | ICD-10-CM | POA: Insufficient documentation

## 2014-11-29 DIAGNOSIS — I10 Essential (primary) hypertension: Secondary | ICD-10-CM | POA: Diagnosis not present

## 2014-11-29 DIAGNOSIS — J159 Unspecified bacterial pneumonia: Secondary | ICD-10-CM | POA: Diagnosis not present

## 2014-11-29 DIAGNOSIS — Z79899 Other long term (current) drug therapy: Secondary | ICD-10-CM | POA: Diagnosis not present

## 2014-11-29 DIAGNOSIS — Z8674 Personal history of sudden cardiac arrest: Secondary | ICD-10-CM | POA: Diagnosis not present

## 2014-11-29 DIAGNOSIS — I2510 Atherosclerotic heart disease of native coronary artery without angina pectoris: Secondary | ICD-10-CM | POA: Diagnosis not present

## 2014-11-29 DIAGNOSIS — E780 Pure hypercholesterolemia: Secondary | ICD-10-CM | POA: Insufficient documentation

## 2014-11-29 DIAGNOSIS — I252 Old myocardial infarction: Secondary | ICD-10-CM | POA: Diagnosis not present

## 2014-11-29 DIAGNOSIS — I5042 Chronic combined systolic (congestive) and diastolic (congestive) heart failure: Secondary | ICD-10-CM | POA: Diagnosis not present

## 2014-11-29 DIAGNOSIS — Z87891 Personal history of nicotine dependence: Secondary | ICD-10-CM | POA: Insufficient documentation

## 2014-11-29 DIAGNOSIS — J189 Pneumonia, unspecified organism: Secondary | ICD-10-CM

## 2014-11-29 DIAGNOSIS — Z9861 Coronary angioplasty status: Secondary | ICD-10-CM | POA: Insufficient documentation

## 2014-11-29 DIAGNOSIS — Z85528 Personal history of other malignant neoplasm of kidney: Secondary | ICD-10-CM | POA: Insufficient documentation

## 2014-11-29 LAB — CBC WITH DIFFERENTIAL/PLATELET
BASOS PCT: 0 % (ref 0–1)
Basophils Absolute: 0 10*3/uL (ref 0.0–0.1)
EOS ABS: 0.5 10*3/uL (ref 0.0–0.7)
EOS PCT: 9 % — AB (ref 0–5)
HCT: 35.1 % — ABNORMAL LOW (ref 39.0–52.0)
HEMOGLOBIN: 12.1 g/dL — AB (ref 13.0–17.0)
Lymphocytes Relative: 21 % (ref 12–46)
Lymphs Abs: 1.3 10*3/uL (ref 0.7–4.0)
MCH: 29 pg (ref 26.0–34.0)
MCHC: 34.5 g/dL (ref 30.0–36.0)
MCV: 84.2 fL (ref 78.0–100.0)
Monocytes Absolute: 0.4 10*3/uL (ref 0.1–1.0)
Monocytes Relative: 6 % (ref 3–12)
Neutro Abs: 4 10*3/uL (ref 1.7–7.7)
Neutrophils Relative %: 64 % (ref 43–77)
Platelets: 127 10*3/uL — ABNORMAL LOW (ref 150–400)
RBC: 4.17 MIL/uL — ABNORMAL LOW (ref 4.22–5.81)
RDW: 13.2 % (ref 11.5–15.5)
WBC: 6.2 10*3/uL (ref 4.0–10.5)

## 2014-11-29 LAB — BASIC METABOLIC PANEL
ANION GAP: 6 (ref 5–15)
BUN: 9 mg/dL (ref 6–23)
CO2: 28 mmol/L (ref 19–32)
CREATININE: 1.38 mg/dL — AB (ref 0.50–1.35)
Calcium: 8.7 mg/dL (ref 8.4–10.5)
Chloride: 108 mmol/L (ref 96–112)
GFR calc Af Amer: 61 mL/min — ABNORMAL LOW (ref >90)
GFR calc non Af Amer: 53 mL/min — ABNORMAL LOW (ref >90)
Glucose, Bld: 84 mg/dL (ref 70–99)
Potassium: 3.5 mmol/L (ref 3.5–5.1)
Sodium: 142 mmol/L (ref 135–145)

## 2014-11-29 LAB — TROPONIN I: Troponin I: 0.03 ng/mL (ref <0.031)

## 2014-11-29 MED ORDER — LEVOFLOXACIN 750 MG PO TABS: 750.0000 mg | ORAL_TABLET | Freq: Every day | ORAL | Status: DC

## 2014-11-29 NOTE — ED Notes (Signed)
Per [facility], pt went to [facility] for CP, been having it for a few days. Today started having SOB with it. EKG with UC showed possible ST elevation in V1, V2, no elevation with EMS. By the time EMS got to patient, denied pain. No nitro given. Pt given 324 asa. Pt had hx of MI, two stents, and cardiac arrest last year. Pt is AAOX4, in NAD.

## 2014-11-29 NOTE — ED Provider Notes (Signed)
CSN: [id number]     Arrival date & time 11/29/14  1106 History   First MD Initiated Contact with Patient 11/29/14 1108     Chief Complaint  Patient presents with  . Chest Pain     (Consider location/radiation/quality/duration/timing/severity/associated sxs/prior Treatment) HPI  64 year old male presents as a transfer from urgent care. He's been having shortness of breath for last 3 days. Comes and goes, worst at night when he is laying down in his chair. He is also been having intermittent chest pressure, last at that this morning. Currently denies any pain or pressure now. Has been having some dry cough as well. He has had a history of a heart attack and stents that occurred when he had a cardiac arrest last year. His cardiologist is Dr. Einar Gip. His daughter called their office and the recommended he go see his doctor for evaluation. Denies leg pain or leg swelling. Was given 3-4 mg aspirin prior to arrival. Patient is currently asymptomatic.  Past Medical History  Diagnosis Date  . Gout   . GERD (gastroesophageal reflux disease)   . Hypertension   . Hypercholesteremia   . Shortness of breath     with activity  . History of epilepsy     at least 10 years ago. 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 Family history of anesthesia complication     mother-had stroke during anesthesia  . HTN (hypertension)   . Seizures   . History of myocardial infarction less than 8 weeks     Stent placed  . CAD (coronary artery disease)   . PAD (peripheral artery disease)   . Hyperlipemia   . Therapeutic drug monitoring   . Coronary artery calcification seen on CAT scan   . SOB (shortness of breath)   . History of tobacco use   . Systolic and diastolic CHF, chronic     Resolved. EF 45% 04/10/2014  . Essential hypertension, benign   . History of nephrectomy 11/2013    For renal cell carcinoma  . Acid reflux   . Abnormal liver enzymes     Chronic alkaline phosphatase elevation since 2014  . History of  cardiac arrest 11/26/13    PTCA/Stenting of LM & Prox LAD with 4.0 x 18 mm Xience alpine stent. Used Impella Circulatory assist device. EF= 20-25%  . Coronary atherosclerosis of native coronary artery   . Cancer 08/2013    right kidney cancer  . Cancer    Past Surgical History  Procedure Laterality Date  . No past surgeries    . Eye surgery Left 2 weeks ago    torn retina  . Robot assisted laparoscopic nephrectomy Right 10/19/2013    Procedure: ROBOTIC ASSISTED LAPAROSCOPIC NEPHRECTOMY,  EXTENSIVE ADHESIOLYSIS;  Surgeon: Alexis Frock, MD;  Location: [facility];  Service: Urology;  Laterality: Right;  . Coronary angioplasty with stent placement  2015    Left main coronary artery stenting on emergent basis along with circulatory support with Impella device through left leg. With 4.0 x 18 mm Xience alpine stent  . Nephrectomy  11/2013  . Iliac artery stent Left 05/28/2014    dr Einar Gip  . Left heart catheterization with coronary angiogram N/A 11/27/2013    Procedure: LEFT HEART CATHETERIZATION WITH CORONARY ANGIOGRAM;  Surgeon: [name], MD;  Location: [facility] CATH LAB;  Service: Cardiovascular;  Laterality: N/A;  . Percutaneous coronary stent intervention (pci-s)  11/27/2013    Procedure: PERCUTANEOUS CORONARY STENT INTERVENTION (PCI-S);  Surgeon: [name], MD;  Location: [place] CATH LAB;  Service: Cardiovascular;;  . Lower extremity angiogram N/A 02/26/2014    Procedure: LOWER EXTREMITY ANGIOGRAM;  Surgeon: [name], MD;  Location: [facility] CATH LAB;  Service: Cardiovascular;  Laterality: N/A;  . Lower extremity angiogram N/A 05/28/2014    Procedure: LOWER EXTREMITY ANGIOGRAM;  Surgeon: [name], MD;  Location: [facility] CATH LAB;  Service: Cardiovascular;  Laterality: N/A;   Family History  Problem Relation Age of Onset  . Stroke Mother   . Heart disease Brother     multiple stents  . Hyperlipidemia Brother   . Emphysema Father    History  Substance Use Topics  . Smoking  status: Former Smoker -- 2.00 packs/day for 45 years    Types: Cigarettes    Quit date: 08/03/2003  . Smokeless tobacco: Current User    Types: Chew     Comment: quit  smoking 10 years agop  . Alcohol Use: No     Comment: quit 1980    Review of Systems  Constitutional: Negative for fever.  Respiratory: Positive for cough and shortness of breath.   Cardiovascular: Positive for chest pain. Negative for leg swelling.  Gastrointestinal: Negative for vomiting.  All other systems reviewed and are negative.     Allergies  Oxycodone  Home Medications   Prior to Admission medications   Medication Sig Start Date End Date Taking? Authorizing Provider  aspirin EC 81 MG tablet Take 1 tablet (81 mg total) by mouth daily. 12/11/13  Yes Daniel J Angiulli, PA-C  Cyanocobalamin (B-12) 5000 MCG CAPS Take 5,000 mcg by mouth daily.    Yes Historical Provider, MD  prasugrel (EFFIENT) 10 MG TABS tablet Take 1 tablet (10 mg total) by mouth daily. 08/13/14  Yes [name], MD  Psyllium (METAMUCIL PO) Take 3 capsules by mouth every evening.    Yes Historical Provider, MD  pyridOXINE (VITAMIN B-6) 100 MG tablet Take 100 mg by mouth daily.   Yes Historical Provider, MD  Thiamine HCl (VITAMIN B-1) 250 MG tablet Take 250 mg by mouth daily.   Yes Historical Provider, MD  atorvastatin (LIPITOR) 20 MG tablet Take 1 tablet (20 mg total) by mouth daily. 08/13/14   [name], MD  carvedilol (COREG) 6.25 MG tablet Take 1 tablet (6.25 mg total) by mouth 2 (two) times daily with a meal. 08/13/14   [name], MD  lisinopril (PRINIVIL,ZESTRIL) 5 MG tablet TAKE ONE TABLET BY MOUTH ONCE DAILY 09/09/14   [name], MD  PHENYTEK 300 MG ER capsule Take 1 capsule (300 mg total) by mouth every morning. 08/13/14   [name], MD   BP 153/76 mmHg  Pulse 69  Temp(Src) 98 F (36.7 C) (Oral)  Resp 21  SpO2 94% Physical Exam  Constitutional: He is oriented to person, place, and time. He appears well-developed and  well-nourished.  HENT:  Head: Normocephalic and atraumatic.  Right Ear: External ear normal.  Left Ear: External ear normal.  Nose: Nose normal.  Eyes: Right eye exhibits no discharge. Left eye exhibits no discharge.  Neck: Neck supple.  Cardiovascular: Normal rate, regular rhythm, normal heart sounds and intact distal pulses.   Pulmonary/Chest: Effort normal. He has no wheezes. He has no rales. He exhibits no tenderness.  Abdominal: Soft. There is no tenderness.  Musculoskeletal: He exhibits no edema or tenderness.  Neurological: He is alert and oriented to person, place, and time.  Skin: Skin is warm and dry.  Nursing note  and vitals reviewed.   ED Course  Procedures (including critical care time) Labs Review Labs Reviewed  BASIC METABOLIC PANEL - Abnormal; Notable for the following:    Creatinine, Ser 1.38 (*)    GFR calc non Af Amer 53 (*)    GFR calc Af Amer 61 (*)    All other components within normal limits  CBC WITH DIFFERENTIAL/PLATELET - Abnormal; Notable for the following:    RBC 4.17 (*)    Hemoglobin 12.1 (*)    HCT 35.1 (*)    Platelets 127 (*)    Eosinophils Relative 9 (*)    All other components within normal limits  TROPONIN I    Imaging Review Dg Chest 2 View  11/29/2014   CLINICAL DATA:  Shortness of breath, dizziness for 3 days.  EXAM: CHEST  2 VIEW  COMPARISON:  11/15/2014  FINDINGS: Mild hyperinflation of the lungs. Focal airspace opacity in the left lower lobe concerning for pneumonia. No focal opacity on the right. No effusions. No acute bony abnormality.  IMPRESSION: Left lower lobe airspace opacity concerning for pneumonia.   Electronically Signed   By: Rolm Baptise M.D.   On: 11/29/2014 13:05     EKG Interpretation   Date/Time:  Friday November 29 2014 11:11:55 EDT Ventricular Rate:  67 PR Interval:  146 QRS Duration: 94 QT Interval:  441 QTC Calculation: 466 R Axis:   74 Text Interpretation:  Sinus rhythm Multiple ventricular premature   complexes Anteroseptal infarct, old Nonspecific repol abnormality, lateral  leads Besides PVCs, ST/T changes similar to Nov 2014 Confirmed by Regenia Skeeter   MD, [place] [phone]) on 11/29/2014 11:19:53 AM      MDM   Final diagnoses:  Community acquired pneumonia    Patient with left lower lobe pneumonia. This consistent with his shortness of breath and cough. He is afebrile, normal white blood cell count, and no signs of endorgan damage. EKG is unchanged from last year. The patient is otherwise well appearing. Occasionally has mild tachypnea but no hypoxia and no distress. The patient adamantly does not want to come in to the hospital at this point is stable for outpatient treatment and I discussed strict return precautions with him and his daughter at the bedside. Discussed his case with his cardiologist as well, Dr. Einar Gip who will call the patient for further check up.    Sherwood Gambler, MD 11/29/14 1349

## 2014-11-29 NOTE — Progress Notes (Addendum)
Subjective:  This chart was scribed for Gilbert Queen, MD by Donato Schultz, Medical Scribe. This patient was seen in Room 8 and the patient's care was started at 10:17 AM.   Patient ID: Gilbert [name], male    DOB: April 08, 1951, 64 y.o.   MRN: [id number]  HPI HPI Comments: Gilbert [name] is a 64 y.o. male s/p cardiac arrest with a history of CAD who presents to the [facility] complaining of constant SOB and non-radiating chest pain that started three days ago but gradually worsened last night.  He states that he feels like there is a pressure in the center of his chest and like someone has their hand over his mouth.  He states that it is hard to breathe air in and blow it out.  He is unable to take a deep breath.  He was not experiencing these symptoms while at work over the past 2 days.  He lists mild wheezing and diaphoresis as associated symptoms.  He has never experienced these symptoms in the past.  He did not receive a flu shot last year.  He has been taking his medications as prescribed.  He denies cough productive of green or yellow sputum and sore throat as associated symptoms.    Past Medical History  Diagnosis Date  . Gout   . GERD (gastroesophageal reflux disease)   . Hypertension   . Hypercholesteremia   . Shortness of breath     with activity  . History of epilepsy     at least 10 years ago. 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 Family history of anesthesia complication     mother-had stroke during anesthesia  . HTN (hypertension)   . Seizures   . History of myocardial infarction less than 8 weeks     Stent placed  . CAD (coronary artery disease)   . PAD (peripheral artery disease)   . Hyperlipemia   . Therapeutic drug monitoring   . Coronary artery calcification seen on CAT scan   . SOB (shortness of breath)   . History of tobacco use   . Systolic and diastolic CHF, chronic     Resolved. EF 45% 04/10/2014  . Essential hypertension, benign   . History of  nephrectomy 11/2013    For renal cell carcinoma  . Acid reflux   . Abnormal liver enzymes     Chronic alkaline phosphatase elevation since 2014  . History of cardiac arrest 11/26/13    PTCA/Stenting of LM & Prox LAD with 4.0 x 18 mm Xience alpine stent. Used Impella Circulatory assist device. EF= 20-25%  . Coronary atherosclerosis of native coronary artery   . Cancer 08/2013    right kidney cancer  . Cancer    Past Surgical History  Procedure Laterality Date  . No past surgeries    . Eye surgery Left 2 weeks ago    torn retina  . Robot assisted laparoscopic nephrectomy Right 10/19/2013    Procedure: ROBOTIC ASSISTED LAPAROSCOPIC NEPHRECTOMY,  EXTENSIVE ADHESIOLYSIS;  Surgeon: Alexis Frock, MD;  Location: [facility];  Service: Urology;  Laterality: Right;  . Coronary angioplasty with stent placement  2015    Left main coronary artery stenting on emergent basis along with circulatory support with Impella device through left leg. With 4.0 x 18 mm Xience alpine stent  . Nephrectomy  11/2013  . Iliac artery stent Left 05/28/2014    dr Einar Gip  . Left heart catheterization with coronary angiogram N/A 11/27/2013  Procedure: LEFT HEART CATHETERIZATION WITH CORONARY ANGIOGRAM;  Surgeon: [name], MD;  Location: [facility] CATH LAB;  Service: Cardiovascular;  Laterality: N/A;  . Percutaneous coronary stent intervention (pci-s)  11/27/2013    Procedure: PERCUTANEOUS CORONARY STENT INTERVENTION (PCI-S);  Surgeon: [name], MD;  Location: [facility] CATH LAB;  Service: Cardiovascular;;  . Lower extremity angiogram N/A 02/26/2014    Procedure: LOWER EXTREMITY ANGIOGRAM;  Surgeon: [name], MD;  Location: [facility] CATH LAB;  Service: Cardiovascular;  Laterality: N/A;  . Lower extremity angiogram N/A 05/28/2014    Procedure: LOWER EXTREMITY ANGIOGRAM;  Surgeon: [name], MD;  Location: [facility] CATH LAB;  Service: Cardiovascular;  Laterality: N/A;   Family History  Problem Relation Age of Onset  .  Stroke Mother   . Heart disease Brother     multiple stents  . Hyperlipidemia Brother   . Emphysema Father    History   Social History  . Marital Status: Single    Spouse Name: N/A  . Number of Children: N/A  . Years of Education: N/A   Occupational History  . Not on file.   Social History Main Topics  . Smoking status: Former Smoker -- 2.00 packs/day for 45 years    Types: Cigarettes    Quit date: 08/03/2003  . Smokeless tobacco: Current User    Types: Chew     Comment: quit  smoking 10 years agop  . Alcohol Use: No     Comment: quit 1980  . Drug Use: No     Comment: none in past 30 years   . Sexual Activity: Not on file   Other Topics Concern  . Not on file   Social History Narrative   ** Merged History Encounter **       Allergies  Allergen Reactions  . Oxycodone Swelling and Other (See Comments)    Tongue and lip    Review of Systems  Constitutional: Positive for diaphoresis.  HENT: Negative for sore throat.   Respiratory: Positive for shortness of breath and wheezing. Negative for cough.   Cardiovascular: Positive for chest pain.  All other systems reviewed and are negative.    Objective:  Physical Exam  Constitutional: He is oriented to person, place, and time. He appears well-developed and well-nourished.  HENT:  Head: Normocephalic and atraumatic.  Eyes: EOM are normal.  Neck: Normal range of motion.  Cardiovascular: Normal rate, regular rhythm and normal heart sounds.  Exam reveals no gallop and no friction rub.   No murmur heard. Pulmonary/Chest: Effort normal and breath sounds normal. No respiratory distress. He has no wheezes. He has no rales.  Musculoskeletal: Normal range of motion.  Neurological: He is alert and oriented to person, place, and time.  Skin: Skin is warm and dry.  Psychiatric: He has a normal mood and affect. His behavior is normal.  Nursing note and vitals reviewed.    BP 152/70 mmHg  Pulse 80  Temp(Src) 99.1 F  (37.3 C) (Oral)  Resp 16 Assessment & Plan:  Patient appeared stable. His EKG does show what appears to be increased ST elevation V1 V2. EMS called they will transport the patient to the hospital for further evaluation. I will let Dr. Irven Shelling   office know he is then sent to the hospital.I personally performed the services described in this documentation, which was scribed in my presence. The recorded information has been reviewed and is accurate.

## 2014-11-29 NOTE — ED Notes (Signed)
Patient transported to X-ray 

## 2014-12-03 ENCOUNTER — Ambulatory Visit (INDEPENDENT_AMBULATORY_CARE_PROVIDER_SITE_OTHER): Payer: 59 | Admitting: Emergency Medicine

## 2014-12-03 ENCOUNTER — Encounter: Payer: Self-pay | Admitting: Emergency Medicine

## 2014-12-03 VITALS — BP 144/68 | HR 72 | Temp 97.7°F | Resp 16 | Ht 69.0 in | Wt 168.6 lb

## 2014-12-03 DIAGNOSIS — R21 Rash and other nonspecific skin eruption: Secondary | ICD-10-CM | POA: Diagnosis not present

## 2014-12-03 DIAGNOSIS — J189 Pneumonia, unspecified organism: Secondary | ICD-10-CM | POA: Diagnosis not present

## 2014-12-03 DIAGNOSIS — I2510 Atherosclerotic heart disease of native coronary artery without angina pectoris: Secondary | ICD-10-CM

## 2014-12-03 LAB — POCT SKIN KOH: Skin KOH, POC: NEGATIVE

## 2014-12-03 MED ORDER — PRASUGREL HCL 10 MG PO TABS: 10.0000 mg | ORAL_TABLET | Freq: Every day | ORAL | Status: DC

## 2014-12-03 NOTE — Patient Instructions (Signed)
Pneumonia Pneumonia is an infection of the lungs.  CAUSES Pneumonia [place]erton be caused by bacteria or a virus. Usually, these infections are caused by breathing infectious particles into the lungs (respiratory tract). SIGNS AND SYMPTOMS   Cough.  Fever.  Chest pain.  Increased rate of breathing.  Wheezing.  Mucus production. DIAGNOSIS  If you have the common symptoms of pneumonia, your health care provider will typically confirm the diagnosis with a chest X-ray. The X-ray will show an abnormality in the lung (pulmonary infiltrate) if you have pneumonia. Other tests of your blood, urine, or sputum Brisbon be done to find the specific cause of your pneumonia. Your health care provider Komorowski also do tests (blood gases or pulse oximetry) to see how well your lungs are working. TREATMENT  Some forms of pneumonia Rahm be spread to other people when you cough or sneeze. You Bardsley be asked to wear a mask before and during your exam. Pneumonia that is caused by bacteria is treated with antibiotic medicine. Pneumonia that is caused by the influenza virus Meiners be treated with an antiviral medicine. Most other viral infections must run their course. These infections will not respond to antibiotics.  HOME CARE INSTRUCTIONS   Cough suppressants Valladares be used if you are losing too much rest. However, coughing protects you by clearing your lungs. You should avoid using cough suppressants if you can.  Your health care provider Rossner have prescribed medicine if he or she thinks your pneumonia is caused by bacteria or influenza. Finish your medicine even if you start to feel better.  Your health care provider Huizar also prescribe an expectorant. This loosens the mucus to be coughed up.  Take medicines only as directed by your health care provider.  Do not smoke. Smoking is a common cause of bronchitis and can contribute to pneumonia. If you are a smoker and continue to smoke, your cough Hendrix last several weeks after your  pneumonia has cleared.  A cold steam vaporizer or humidifier in your room or home Angelica help loosen mucus.  Coughing is often worse at night. Sleeping in a semi-upright position in a recliner or using a couple pillows under your head will help with this.  Get rest as you feel it is needed. Your body will usually let you know when you need to rest. PREVENTION A pneumococcal shot (vaccine) is available to prevent a common bacterial cause of pneumonia. This is usually suggested for:  People over 65 years old.  Patients on chemotherapy.  People with chronic lung problems, such as bronchitis or emphysema.  People with immune system problems. If you are over 65 or have a high risk condition, you Dierolf receive the pneumococcal vaccine if you have not received it before. In some countries, a routine influenza vaccine is also recommended. This vaccine can help prevent some cases of pneumonia.You Cartlidge be offered the influenza vaccine as part of your care. If you smoke, it is time to quit. You Rolland receive instructions on how to stop smoking. Your health care provider can provide medicines and counseling to help you quit. SEEK MEDICAL CARE IF: You have a fever. SEEK IMMEDIATE MEDICAL CARE IF:   Your illness becomes worse. This is especially true if you are elderly or weakened from any other disease.  You cannot control your cough with suppressants and are losing sleep.  You begin coughing up blood.  You develop pain which is getting worse or is uncontrolled with medicines.  Any of the symptoms   which initially brought you in for treatment are getting worse rather than better.  You develop shortness of breath or chest pain. MAKE SURE YOU:   Understand these instructions.  Will watch your condition.  Will get help right away if you are not doing well or get worse. Document Released: 07/19/2005 Document Revised: 12/03/2013 Document Reviewed: 10/08/2010 Exit[employer] Patient Information 2015  [employer]. This information is not intended to replace advice given to you by your health care provider. Make sure you discuss any questions you have with your health care provider.  

## 2014-12-03 NOTE — Progress Notes (Signed)
   Subjective:    Patient ID: Gilbert [name], male    DOB: Aug 26, 1950, 64 y.o.   MRN: [id number]  HPI patient enters for follow-up after being sent to the hospital last week with shortness of breath and chest discomfort. He did have some minimal change in ST segments anterior leads. Troponin done at the hospital was normal. Chest x-ray showed a basilar infiltrate white count was normal. He was placed on Levaquin for pneumonia and has felt significantly better.    Review of Systems     Objective:   Physical Exam  Constitutional: He appears well-developed.  HENT:  Head: Normocephalic.  Eyes: Pupils are equal, round, and reactive to light.  Neck: No thyromegaly present.  Cardiovascular: Normal rate and regular rhythm.   Pulmonary/Chest: Effort normal and breath sounds normal. No respiratory distress. He has no wheezes. He has no rales.  Abdominal: Soft. He exhibits no distension.          Assessment & Plan:   patient looks good today. We'll try move up his appointment with Dr. Einar Gip. His chest was clear. We'll recheck in 10 days repeat chest x-ray at that time.

## 2014-12-12 ENCOUNTER — Ambulatory Visit (INDEPENDENT_AMBULATORY_CARE_PROVIDER_SITE_OTHER): Payer: 59 | Admitting: Emergency Medicine

## 2014-12-12 ENCOUNTER — Ambulatory Visit (INDEPENDENT_AMBULATORY_CARE_PROVIDER_SITE_OTHER): Payer: 59

## 2014-12-12 ENCOUNTER — Encounter: Payer: Self-pay | Admitting: Emergency Medicine

## 2014-12-12 VITALS — BP 140/60 | HR 64 | Temp 97.4°F | Resp 16 | Ht 70.0 in | Wt 165.2 lb

## 2014-12-12 DIAGNOSIS — J189 Pneumonia, unspecified organism: Secondary | ICD-10-CM

## 2014-12-12 NOTE — Progress Notes (Signed)
Subjective:  This chart was scribed for [name], MD by Ladene Artist, ED Scribe. The patient was seen in room 22. Patient's care was started at 12:40 PM.   Patient ID: Gilbert [name], male    DOB: 20-Aug-1950, 64 y.o.   MRN: [id number]  Chief Complaint  Patient presents with  . Follow-up    pneumonia  . Coronary Artery Disease   HPI HPI Comments: Gilbert [name] is a 64 y.o. male, with a h/o epilepsy, CAD, PAD, CHF, HTN, hypercholesteremia, renal CA, who presents to the [facility] for a follow-up regarding PNA. Pt has completed treatment for PNA. He states that he feels well overall. He denies cough, chest pain, SOB. Pt quit smoking on 02/03/2003.   Cardiology  Pt has been seen by cardiologist Adrian Prows; good cardiac checkup.    Past Medical History  Diagnosis Date  . Gout   . GERD (gastroesophageal reflux disease)   . Hypertension   . Hypercholesteremia   . Shortness of breath     with activity  . History of epilepsy     at least 10 years ago. 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 Family history of anesthesia complication     mother-had stroke during anesthesia  . HTN (hypertension)   . Seizures   . History of myocardial infarction less than 8 weeks     Stent placed  . CAD (coronary artery disease)   . PAD (peripheral artery disease)   . Hyperlipemia   . Therapeutic drug monitoring   . Coronary artery calcification seen on CAT scan   . SOB (shortness of breath)   . History of tobacco use   . Systolic and diastolic CHF, chronic     Resolved. EF 45% 04/10/2014  . Essential hypertension, benign   . History of nephrectomy 11/2013    For renal cell carcinoma  . Acid reflux   . Abnormal liver enzymes     Chronic alkaline phosphatase elevation since 2014  . History of cardiac arrest 11/26/13    PTCA/Stenting of LM & Prox LAD with 4.0 x 18 mm Xience alpine stent. Used Impella Circulatory assist device. EF= 20-25%  . Coronary atherosclerosis of native coronary  artery   . Cancer 08/2013    right kidney cancer  . Cancer    Current Outpatient Prescriptions on File Prior to Visit  Medication Sig Dispense Refill  . aspirin EC 81 MG tablet Take 1 tablet (81 mg total) by mouth daily. (Patient taking differently: Take 81 mg by mouth every morning. )    . atorvastatin (LIPITOR) 40 MG tablet Take 40 mg by mouth every morning.     . carvedilol (COREG) 12.5 MG tablet Take 12.5 mg by mouth 2 (two) times daily with a meal.     . Cyanocobalamin (B-12) 5000 MCG CAPS Take 5,000 mcg by mouth every morning.     . pantoprazole (PROTONIX) 40 MG tablet Take 40 mg by mouth every morning.     Marland Kitchen PHENYTEK 300 MG ER capsule Take 1 capsule (300 mg total) by mouth every morning. 30 capsule 11  . prasugrel (EFFIENT) 10 MG TABS tablet Take 1 tablet (10 mg total) by mouth daily. 30 tablet 11  . Psyllium (METAMUCIL PO) Take 3 capsules by mouth every evening.     . pyridOXINE (VITAMIN B-6) 100 MG tablet Take 100 mg by mouth every morning.     . Thiamine HCl (VITAMIN B-1) 250 MG tablet Take 250 mg  by mouth every morning.     . valsartan (DIOVAN) 160 MG tablet Take 160 mg by mouth every morning.      No current facility-administered medications on file prior to visit.   Allergies  Allergen Reactions  . Oxycodone Swelling and Other (See Comments)    Tongue and lip   Review of Systems  Respiratory: Negative for cough and shortness of breath.   Cardiovascular: Negative for chest pain.   BP 140/60 mmHg  Pulse 64  Temp(Src) 97.4 F (36.3 C) (Oral)  Resp 16  Ht 5\' 10"  (1.778 m)  Wt 165 lb 3.2 oz (74.934 kg)  BMI 23.70 kg/m2  SpO2 97%     Objective:   Physical Exam  Constitutional: He is oriented to person, place, and time. He appears well-developed and well-nourished. No distress.  HENT:  Head: Normocephalic and atraumatic.  Eyes: Conjunctivae and EOM are normal.  Neck: Neck supple. No tracheal deviation present.  Cardiovascular: Normal rate and regular rhythm.     Pulmonary/Chest: Effort normal and breath sounds normal. No respiratory distress.  Lungs are clear to auscultation.   Musculoskeletal: Normal range of motion.  Neurological: He is alert and oriented to person, place, and time.  Skin: Skin is warm and dry.  Psychiatric: He has a normal mood and affect. His behavior is normal.  Nursing note and vitals reviewed. [facility] reading (PRIMARY) by  Dr Everlene Farrier the infiltrate in the left base has cleared.     Assessment & Plan:  Significant improvement left lower lobe pneumonia. Patient agrees to see me in 3 months. He is agreeable to take pneumonia vaccine at that time. He is still adamant he will not take flu vaccine.I personally performed the services described in this documentation, which was scribed in my presence. The recorded information has been reviewed and is accurate.  Nena Jordan, MD

## 2014-12-17 ENCOUNTER — Ambulatory Visit: Payer: 59 | Admitting: Emergency Medicine

## 2014-12-19 ENCOUNTER — Other Ambulatory Visit: Payer: Self-pay | Admitting: Emergency Medicine

## 2014-12-19 ENCOUNTER — Encounter: Payer: Self-pay | Admitting: Emergency Medicine

## 2014-12-19 ENCOUNTER — Telehealth: Payer: Self-pay | Admitting: Internal Medicine

## 2014-12-19 ENCOUNTER — Telehealth: Payer: Self-pay | Admitting: Emergency Medicine

## 2014-12-19 ENCOUNTER — Encounter: Payer: Self-pay | Admitting: Nurse Practitioner

## 2014-12-19 ENCOUNTER — Ambulatory Visit (INDEPENDENT_AMBULATORY_CARE_PROVIDER_SITE_OTHER): Payer: 59 | Admitting: Nurse Practitioner

## 2014-12-19 ENCOUNTER — Other Ambulatory Visit (INDEPENDENT_AMBULATORY_CARE_PROVIDER_SITE_OTHER): Payer: 59

## 2014-12-19 VITALS — BP 130/72 | HR 72 | Ht 70.0 in | Wt 166.0 lb

## 2014-12-19 DIAGNOSIS — R1013 Epigastric pain: Secondary | ICD-10-CM

## 2014-12-19 DIAGNOSIS — K219 Gastro-esophageal reflux disease without esophagitis: Secondary | ICD-10-CM

## 2014-12-19 DIAGNOSIS — R112 Nausea with vomiting, unspecified: Secondary | ICD-10-CM | POA: Diagnosis not present

## 2014-12-19 LAB — BASIC METABOLIC PANEL
BUN: 17 mg/dL (ref 6–23)
CHLORIDE: 104 meq/L (ref 96–112)
CO2: 29 meq/L (ref 19–32)
Calcium: 9.5 mg/dL (ref 8.4–10.5)
Creatinine, Ser: 1.33 mg/dL (ref 0.40–1.50)
GFR: 57.51 mL/min — ABNORMAL LOW (ref >60.00)
GLUCOSE: 86 mg/dL (ref 70–99)
POTASSIUM: 3.7 meq/L (ref 3.5–5.1)
Sodium: 142 mEq/L (ref 135–145)

## 2014-12-19 LAB — CBC WITH DIFFERENTIAL/PLATELET
Basophils Absolute: 0 10*3/uL (ref 0.0–0.1)
Basophils Relative: 0.7 % (ref 0.0–3.0)
EOS PCT: 9.2 % — AB (ref 0.0–5.0)
Eosinophils Absolute: 0.6 10*3/uL (ref 0.0–0.7)
HEMATOCRIT: 38 % — AB (ref 39.0–52.0)
Hemoglobin: 12.9 g/dL — ABNORMAL LOW (ref 13.0–17.0)
LYMPHS ABS: 1.8 10*3/uL (ref 0.7–4.0)
Lymphocytes Relative: 27.4 % (ref 12.0–46.0)
MCHC: 34 g/dL (ref 30.0–36.0)
MCV: 84.9 fl (ref 78.0–100.0)
Monocytes Absolute: 0.5 10*3/uL (ref 0.1–1.0)
Monocytes Relative: 7.2 % (ref 3.0–12.0)
Neutro Abs: 3.7 10*3/uL (ref 1.4–7.7)
Neutrophils Relative %: 55.5 % (ref 43.0–77.0)
PLATELETS: 159 10*3/uL (ref 150.0–400.0)
RBC: 4.48 Mil/uL (ref 4.22–5.81)
RDW: 13.4 % (ref 11.5–15.5)
WBC: 6.7 10*3/uL (ref 4.0–10.5)

## 2014-12-19 MED ORDER — TRAMADOL HCL 50 MG PO TABS: 50.0000 mg | ORAL_TABLET | Freq: Four times a day (QID) | ORAL | Status: DC | PRN

## 2014-12-19 MED ORDER — ONDANSETRON HCL 4 MG PO TABS: ORAL_TABLET | ORAL | Status: DC

## 2014-12-19 NOTE — Telephone Encounter (Signed)
Needs to be seen today/tomorrow for possible melena Gilbert [name] willing to see this PM  Please call patient about coming in  He was just at Dr. Irven Shelling office but I believe was headed home

## 2014-12-19 NOTE — Patient Instructions (Signed)
Please go to the basement level to have your labs drawn.  We sent prescriptions to [facility]. 1. Zofran 4 mg for nausea 2. Ultram 50 mg .

## 2014-12-19 NOTE — Telephone Encounter (Signed)
LM for patient on his phone# [phone]. I asked if he would please come to our office at 1:15 PM per Dr. Celesta Aver request. He would see Tye Savoy NP-C.

## 2014-12-19 NOTE — Telephone Encounter (Signed)
The patient's daughter called and I asked if she could bring her Dad to our office per Dr. Carlean Purl to see Tye Savoy NP at 1;15 PM.  The daughter said yes then can come to this appointment. . I asked they come by 1;10 PM for a 1:15 appointnment. She verified our location.

## 2014-12-19 NOTE — Progress Notes (Signed)
HPI :  Patient is a 64 year old male worked in urgently today at the request of cardiology for weight loss and dark stool on blood thinners. Patient has multiple medical problems not limited to cardiac arrest April 2015, cardiac stenting, chronic systolic and diastolic congestive heart failure, seizure disorder, and stage III right renal cell cancer status post nephrectomy March 2015.   Patient gives at least a one-year history of nausea, vomiting and nonradiating epigastric pain. Patient is not a great historian, cannot confidently relate the pain to eating.  He lost 60 pounds in 2015 but was also diagnosed with renal cell carcinoma and had a myocardial infarction in 2015,.  Though still losing weight it sounds like losses have been much less drastic over the last few months.  He takes a daily baby aspirin, no other NSAIDs. Patient takes twice daily PPI. He has been taking Pepto-Bismol.  Patient recalls seeing [facility] about one year ago, possibly for same symptoms.  He describes having had an EGD but doesn't know results.   Past Medical History  Diagnosis Date  . Gout   . GERD (gastroesophageal reflux disease)   . Hypertension   . Hypercholesteremia   . Shortness of breath     with activity  . History of epilepsy     at least 10 years ago. 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 Family history of anesthesia complication     mother-had stroke during anesthesia  . HTN (hypertension)   . Seizures   . History of myocardial infarction less than 8 weeks     Stent placed  . CAD (coronary artery disease)   . PAD (peripheral artery disease)   . Hyperlipemia   . Therapeutic drug monitoring   . Coronary artery calcification seen on CAT scan   . SOB (shortness of breath)   . History of tobacco use   . Systolic and diastolic CHF, chronic     Resolved. EF 45% 04/10/2014  . Essential hypertension, benign   . History of nephrectomy 11/2013    For renal cell carcinoma  . Acid reflux     . Abnormal liver enzymes     Chronic alkaline phosphatase elevation since 2014  . History of cardiac arrest 11/26/13    PTCA/Stenting of LM & Prox LAD with 4.0 x 18 mm Xience alpine stent. Used Impella Circulatory assist device. EF= 20-25%  . Coronary atherosclerosis of native coronary artery   . Cancer 08/2013    right kidney cancer    Family History  Problem Relation Age of Onset  . Stroke Mother   . Heart disease Brother     multiple stents  . Hyperlipidemia Brother   . Emphysema Father    History  Substance Use Topics  . Smoking status: Former Smoker -- 2.00 packs/day for 45 years    Types: Cigarettes    Quit date: 08/03/2003  . Smokeless tobacco: Current User    Types: Chew     Comment: quit  smoking 10 years agop  . Alcohol Use: No     Comment: quit 1980   Current Outpatient Prescriptions  Medication Sig Dispense Refill  . aspirin EC 81 MG tablet Take 1 tablet (81 mg total) by mouth daily. (Patient taking differently: Take 81 mg by mouth every morning. )    . atorvastatin (LIPITOR) 40 MG tablet Take 40 mg by mouth every morning.     . carvedilol (COREG) 12.5 MG tablet Take 12.5 mg by mouth 2 (two) times  daily with a meal.     . Cyanocobalamin (B-12) 5000 MCG CAPS Take 5,000 mcg by mouth every morning.     . pantoprazole (PROTONIX) 40 MG tablet Take 40 mg by mouth every morning.     Marland Kitchen PHENYTEK 300 MG ER capsule Take 1 capsule (300 mg total) by mouth every morning. 30 capsule 11  . prasugrel (EFFIENT) 10 MG TABS tablet Take 1 tablet (10 mg total) by mouth daily. 30 tablet 11  . Psyllium (METAMUCIL PO) Take 3 capsules by mouth every evening.     . pyridOXINE (VITAMIN B-6) 100 MG tablet Take 100 mg by mouth every morning.     . Thiamine HCl (VITAMIN B-1) 250 MG tablet Take 250 mg by mouth every morning.     . valsartan (DIOVAN) 160 MG tablet Take 160 mg by mouth every morning.      No current facility-administered medications for this visit.   Allergies  Allergen  Reactions  . Oxycodone Swelling and Other (See Comments)    Tongue and lip    Review of Systems: All systems reviewed and negative except where noted in HPI.    Physical Exam: BP 130/72 mmHg  Pulse 72  Ht 5\' 10"  (1.778 m)  Wt 166 lb (75.297 kg)  BMI 23.82 kg/m2 Constitutional: Pleasant,well-developed, white male in no acute distress. HEENT: Normocephalic and atraumatic. Conjunctivae are normal. No scleral icterus. Neck supple.  Cardiovascular: Normal rate, regular rhythm.  Pulmonary/chest: Effort normal and breath sounds normal. No wheezing, rales or rhonchi. Abdominal: Soft, nondistended, nontender. Bowel sounds active throughout. There are no masses palpable. No hepatomegaly. Rectal: dark, heme negative stool. External hemorrhoids Extremities: no edema Lymphadenopathy: No cervical adenopathy noted. Neurological: Alert and oriented to person place and time. Skin: Skin is warm and dry. No rashes noted. Psychiatric: Normal mood and affect. Behavior is normal.   ASSESSMENT AND PLAN:   52.  64 year old male with multiple medical problems not limited to heart failure, hypertension, seizure disorder, cardiac arrest April 2015, cardiac stenting. He is on Effient.    2. Dark stools. Patient referred by cardiology for weight loss and black stool. Taking Pepto-Bismol, he is Hemoccult-negative today. Will check CBC as lesions can bleed intermittently.   3. Chronic nausea, vomiting, and epigastric pain evaluated by [facility] one year ago. Sounds like he had an upper endoscopy (for same symptoms).  Will request records from [place] and then decide if repeat EGD necessary. He Basham need workup for gastroparesis or other etiologies of symptoms. Continue twice daily PPI for now. Trial of Zofran for nausea and Ultram for pain.  Cc: Kela Millin, MD

## 2014-12-19 NOTE — Telephone Encounter (Signed)
I responded to a message in email and when I checked the message I saw the patient had already  been to Dr. Celesta Aver office and they had taken over care for this problem. I'm available if he needs to be seen but will be out of the office next week

## 2014-12-19 NOTE — Telephone Encounter (Signed)
Dark stools x 2 weeks Weight loss  Saw edwards in past but dismissed

## 2014-12-20 ENCOUNTER — Encounter: Payer: Self-pay | Admitting: Nurse Practitioner

## 2014-12-20 DIAGNOSIS — R112 Nausea with vomiting, unspecified: Secondary | ICD-10-CM | POA: Insufficient documentation

## 2014-12-20 DIAGNOSIS — R1013 Epigastric pain: Secondary | ICD-10-CM | POA: Insufficient documentation

## 2014-12-21 NOTE — Progress Notes (Signed)
I spoke to Dr. Einar Gip and the patient day of visit. Conferred w/ [name]. Agree with plans. He is not bleeding. Has GI symptoms though - need to see prior work-up before going further. Gatha Mayer, MD, [facility]

## 2014-12-31 ENCOUNTER — Telehealth: Payer: Self-pay | Admitting: *Deleted

## 2014-12-31 ENCOUNTER — Other Ambulatory Visit: Payer: Self-pay | Admitting: *Deleted

## 2014-12-31 DIAGNOSIS — R1314 Dysphagia, pharyngoesophageal phase: Secondary | ICD-10-CM

## 2014-12-31 DIAGNOSIS — R6881 Early satiety: Secondary | ICD-10-CM

## 2014-12-31 NOTE — Telephone Encounter (Signed)
Called patient and LM asking him to call me. I advised we got the records from [place] since he saw Gilbert Savoy NP-C on 5-18.  Dr Carlean Purl thinks he should have an Endoscopy.  I asked he call me so I can give him the details.

## 2014-12-31 NOTE — Telephone Encounter (Signed)
  12/31/2014   RE: Gilbert [name] DOB: Nov 26, 1950 MRN: [id number]   Dear Dr. Adrian Prows,    We have scheduled the above patient for an endoscopic procedure. Our records show that he is on anticoagulation therapy.   Please advise as to how long the patient Dahmer come off his therapy of Effient prior to the procedure, which is scheduled for 01-21-2015. Dr. Silvano Rusk will be performing the Endoscopy at [facility].  Please fax back/ or route the completed form to [place] at [phone].   Sincerely,    Tye Savoy NP-C

## 2015-01-01 NOTE — Telephone Encounter (Signed)
Left second message for this patient today 01-01-2015 asking him to call me.

## 2015-01-02 ENCOUNTER — Other Ambulatory Visit: Payer: Self-pay | Admitting: *Deleted

## 2015-01-02 NOTE — Telephone Encounter (Signed)
Called and spoke to the patient and advised him of the Endoscopy scheduled for 01-21-2015 with Dr Carlean Purl at [facility].  I told him I will mail out to him his instructions . I also advised him that Dr. Carlean Purl will be talking to Dr. Einar Gip about the Effient medication . I told him I will call him back with those instructions for the Effient. I verified his address. He thanked me for calling.

## 2015-01-16 ENCOUNTER — Telehealth: Payer: Self-pay | Admitting: *Deleted

## 2015-01-16 NOTE — Telephone Encounter (Signed)
-----   Message from Gatha Mayer, MD sent at 01/15/2015 10:14 PM EDT ----- I spoke to Dr. Einar Gip the other week and the plan was that we would hold Effient x 6 days and continue aspirin but take 325 mg daily (take 3 baby ASA daily is ok)  Dr. Einar Gip was going to contact the patient and see if he wanted to do a Lovenox bridge (possibly) so at this point let us tell him to stop the Effient x 6 days and when he does tak 3 baby ASA daily or one 325 mg ASA daily  ----- Message -----    From: Tonette Bihari, CMA    Sent: 01/14/2015   4:37 PM      To: Gatha Mayer, MD  Dr Carlean Purl, Just a reminder regarding Gilbert [name] MR # [id number]. Scheduled at [facility] with you on 01-21-2015 . Dr Einar Gip did state in his response to our [place] letter:  :" If you really have to ,  we could stop the Effient 6 days prior to the procedure and restart ASAP."  However is it possible to do diagnostic first to see if anything invasive needs to be performed. Let me know. Dr Einar Gip'.   My cell is :  [phone]."    Marisue Humble CMA

## 2015-01-16 NOTE — Telephone Encounter (Signed)
LM for the patient to please call me asap. I have important Effient instructions for him.

## 2015-01-16 NOTE — Telephone Encounter (Signed)
Spoke to the patient and he said he was told to hold the Effient for 5 days.  I told him to stop it today, ( he had not taken it today) and he can resume it on 01-22-2015 unless told otherwise by Dr. Carlean Purl .  ( Procedure at [facility] is on 01-21-2015. ) Patient verbalized understanding the instructions.

## 2015-01-20 ENCOUNTER — Other Ambulatory Visit: Payer: Self-pay | Admitting: Internal Medicine

## 2015-01-20 DIAGNOSIS — R1310 Dysphagia, unspecified: Secondary | ICD-10-CM

## 2015-01-21 ENCOUNTER — Encounter (HOSPITAL_COMMUNITY): Payer: Self-pay | Admitting: Internal Medicine

## 2015-01-21 ENCOUNTER — Encounter (HOSPITAL_COMMUNITY)
Admission: RE | Disposition: A | Discharge status: Home / Self Care | Payer: 59 | Source: Ambulatory Visit | Attending: Internal Medicine

## 2015-01-21 ENCOUNTER — Ambulatory Visit (HOSPITAL_COMMUNITY)
Admission: RE | Admit: 2015-01-21 | Discharge: 2015-01-21 | Disposition: A | Discharge status: Home / Self Care | Payer: 59 | Source: Ambulatory Visit | Attending: Internal Medicine | Admitting: Internal Medicine

## 2015-01-21 DIAGNOSIS — K2970 Gastritis, unspecified, without bleeding: Secondary | ICD-10-CM

## 2015-01-21 DIAGNOSIS — Z905 Acquired absence of kidney: Secondary | ICD-10-CM | POA: Insufficient documentation

## 2015-01-21 DIAGNOSIS — E782 Mixed hyperlipidemia: Secondary | ICD-10-CM | POA: Insufficient documentation

## 2015-01-21 DIAGNOSIS — Z885 Allergy status to narcotic agent status: Secondary | ICD-10-CM | POA: Insufficient documentation

## 2015-01-21 DIAGNOSIS — K222 Esophageal obstruction: Secondary | ICD-10-CM | POA: Insufficient documentation

## 2015-01-21 DIAGNOSIS — I2510 Atherosclerotic heart disease of native coronary artery without angina pectoris: Secondary | ICD-10-CM | POA: Insufficient documentation

## 2015-01-21 DIAGNOSIS — K219 Gastro-esophageal reflux disease without esophagitis: Secondary | ICD-10-CM | POA: Insufficient documentation

## 2015-01-21 DIAGNOSIS — K2990 Gastroduodenitis, unspecified, without bleeding: Secondary | ICD-10-CM

## 2015-01-21 DIAGNOSIS — I10 Essential (primary) hypertension: Secondary | ICD-10-CM | POA: Insufficient documentation

## 2015-01-21 DIAGNOSIS — R6881 Early satiety: Secondary | ICD-10-CM | POA: Insufficient documentation

## 2015-01-21 DIAGNOSIS — Z85528 Personal history of other malignant neoplasm of kidney: Secondary | ICD-10-CM | POA: Diagnosis not present

## 2015-01-21 DIAGNOSIS — R1310 Dysphagia, unspecified: Secondary | ICD-10-CM | POA: Diagnosis present

## 2015-01-21 DIAGNOSIS — I5042 Chronic combined systolic (congestive) and diastolic (congestive) heart failure: Secondary | ICD-10-CM | POA: Insufficient documentation

## 2015-01-21 DIAGNOSIS — M109 Gout, unspecified: Secondary | ICD-10-CM | POA: Diagnosis not present

## 2015-01-21 DIAGNOSIS — G40909 Epilepsy, unspecified, not intractable, without status epilepticus: Secondary | ICD-10-CM | POA: Insufficient documentation

## 2015-01-21 DIAGNOSIS — E780 Pure hypercholesterolemia: Secondary | ICD-10-CM | POA: Insufficient documentation

## 2015-01-21 DIAGNOSIS — Z7982 Long term (current) use of aspirin: Secondary | ICD-10-CM | POA: Diagnosis not present

## 2015-01-21 DIAGNOSIS — R1314 Dysphagia, pharyngoesophageal phase: Secondary | ICD-10-CM | POA: Diagnosis not present

## 2015-01-21 DIAGNOSIS — I252 Old myocardial infarction: Secondary | ICD-10-CM | POA: Diagnosis not present

## 2015-01-21 DIAGNOSIS — Z955 Presence of coronary angioplasty implant and graft: Secondary | ICD-10-CM | POA: Diagnosis not present

## 2015-01-21 DIAGNOSIS — K319 Disease of stomach and duodenum, unspecified: Secondary | ICD-10-CM | POA: Insufficient documentation

## 2015-01-21 DIAGNOSIS — Z87891 Personal history of nicotine dependence: Secondary | ICD-10-CM | POA: Insufficient documentation

## 2015-01-21 DIAGNOSIS — I739 Peripheral vascular disease, unspecified: Secondary | ICD-10-CM | POA: Diagnosis not present

## 2015-01-21 HISTORY — PX: BALLOON DILATION: SHX5330

## 2015-01-21 HISTORY — PX: ESOPHAGOGASTRODUODENOSCOPY: SHX5428

## 2015-01-21 SURGERY — EGD (ESOPHAGOGASTRODUODENOSCOPY)
Anesthesia: Moderate Sedation

## 2015-01-21 SURGERY — Surgical Case
Anesthesia: *Unknown

## 2015-01-21 MED ORDER — BUTAMBEN-TETRACAINE-BENZOCAINE 2-2-14 % EX AERO
INHALATION_SPRAY | CUTANEOUS | Status: DC | PRN
  Administered 2015-01-21: 2 via TOPICAL

## 2015-01-21 MED ORDER — MIDAZOLAM HCL 5 MG/ML IJ SOLN
INTRAMUSCULAR | Status: AC
  Filled 2015-01-21: qty 2

## 2015-01-21 MED ORDER — SODIUM CHLORIDE 0.9 % IV SOLN: INTRAVENOUS | Status: DC

## 2015-01-21 MED ORDER — FENTANYL CITRATE (PF) 100 MCG/2ML IJ SOLN
INTRAMUSCULAR | Status: DC | PRN
  Administered 2015-01-21: 25 ug via INTRAVENOUS

## 2015-01-21 MED ORDER — PRASUGREL HCL 10 MG PO TABS: 10.0000 mg | ORAL_TABLET | Freq: Every day | ORAL | Status: DC

## 2015-01-21 MED ORDER — FENTANYL CITRATE (PF) 100 MCG/2ML IJ SOLN
INTRAMUSCULAR | Status: AC
  Filled 2015-01-21: qty 2

## 2015-01-21 MED ORDER — MIDAZOLAM HCL 10 MG/2ML IJ SOLN
INTRAMUSCULAR | Status: DC | PRN
  Administered 2015-01-21: 2 mg via INTRAVENOUS

## 2015-01-21 NOTE — Discharge Instructions (Addendum)
°  I took stomach biopsies of inflammation or gastritis that I saw.  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  Will call with results and follow-up recommendations.  Restart Effient tomorrow  I appreciate the opportunity to care for you. Gatha Mayer, MD, FACG   YOU HAD AN ENDOSCOPIC PROCEDURE TODAY: Refer to the procedure report and other information in the discharge instructions given to you for any specific questions about what was found during the examination. If this information does not answer your questions, please call Dr. Celesta Aver office at [phone] to clarify.   YOU SHOULD EXPECT: Some feelings of bloating in the abdomen. Passage of more gas than usual. Walking can help get rid of the air that was put into your GI tract during the procedure and reduce the bloating. If you had a lower endoscopy (such as a colonoscopy or flexible sigmoidoscopy) you [place] notice spotting of blood in your stool or on the toilet paper. Some abdominal soreness [place] be present for a day or two, also.    DIET:  Clear liquids (cream in coffee ok) until 930 AM then soft foods today. Try normal consistency foods tomorrow.   ACTIVITY: Your care partner should take you home directly after the procedure. You should plan to take it easy, moving slowly for the rest of the day. You can resume normal activity the day after the procedure however YOU SHOULD NOT DRIVE, use power tools, machinery or perform tasks that involve climbing or major physical exertion for 24 hours (because of the sedation medicines used during the test).   SYMPTOMS TO REPORT IMMEDIATELY: A gastroenterologist can be reached at any hour. Please call [phone]  for any of the following symptoms:   Following upper endoscopy (EGD, EUS, ERCP, esophageal dilation) Vomiting of blood or coffee ground material  New, significant abdominal pain  New, significant chest pain or pain under the shoulder blades  Painful  or persistently difficult swallowing  New shortness of breath  Black, tarry-looking or red, bloody stools

## 2015-01-21 NOTE — H&P (Signed)
[facility] History and Physical   Primary Care Physician:  Jenny Reichmann, MD   Reason for Procedure:   vomiting, early satiety, weight loss ? dysphagia  Plan:    EGD, possible dilation of esophagus The risks and benefits as well as alternatives of endoscopic procedure(s) have been discussed and reviewed. All questions answered. The patient agrees to proceed.      HPI: Jamaine Pippenger is a 64 y.o. male seen in [place] 2016 - HX as below he was noty heme +  Patient is a 64 year old male worked in urgently today at the request of cardiology for weight loss and dark stool on blood thinners. Patient has multiple medical problems not limited to cardiac arrest April 2015, cardiac stenting, chronic systolic and diastolic congestive heart failure, seizure disorder, and stage III right renal cell cancer status post nephrectomy March 2015.   Patient gives at least a one-year history of nausea, vomiting and nonradiating epigastric pain. Patient is not a great historian, cannot confidently relate the pain to eating. He lost 60 pounds in 2015 but was also diagnosed with renal cell carcinoma and had a myocardial infarction in 2015,. Though still losing weight it sounds like losses have been much less drastic over the last few months. He takes a daily baby aspirin, no other NSAIDs. Patient takes twice daily PPI. He has been taking Pepto-Bismol. Patient recalls seeing [facility] about one year ago, possibly for same symptoms. He describes having had an EGD but doesn't know results.    Past Medical History  Diagnosis Date  . Gout   . GERD (gastroesophageal reflux disease)   . Hypertension   . Hypercholesteremia   . Shortness of breath     with activity  . History of epilepsy     at least 10 years ago. 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 Family history of anesthesia complication     mother-had stroke during anesthesia  . HTN (hypertension)   . Seizures   . History of  myocardial infarction less than 8 weeks     Stent placed  . CAD (coronary artery disease)   . PAD (peripheral artery disease)   . Hyperlipemia   . Therapeutic drug monitoring   . Coronary artery calcification seen on CAT scan   . SOB (shortness of breath)   . History of tobacco use   . Systolic and diastolic CHF, chronic     Resolved. EF 45% 04/10/2014  . Essential hypertension, benign   . History of nephrectomy 11/2013    For renal cell carcinoma  . Acid reflux   . Abnormal liver enzymes     Chronic alkaline phosphatase elevation since 2014  . History of cardiac arrest 11/26/13    PTCA/Stenting of LM & Prox LAD with 4.0 x 18 mm Xience alpine stent. Used Impella Circulatory assist device. EF= 20-25%  . Coronary atherosclerosis of native coronary artery   . Cancer 08/2013    right kidney cancer  . Cancer     Past Surgical History  Procedure Laterality Date  . No past surgeries    . Eye surgery Left 2 weeks ago    torn retina  . Robot assisted laparoscopic nephrectomy Right 10/19/2013    Procedure: ROBOTIC ASSISTED LAPAROSCOPIC NEPHRECTOMY,  EXTENSIVE ADHESIOLYSIS;  Surgeon: Alexis Frock, MD;  Location: [facility];  Service: Urology;  Laterality: Right;  . Coronary angioplasty with stent placement  2015    Left main coronary artery stenting on emergent basis along with circulatory support  with Impella device through left leg. With 4.0 x 18 mm Xience alpine stent  . Nephrectomy  11/2013  . Iliac artery stent Left 05/28/2014    dr Einar Gip  . Left heart catheterization with coronary angiogram N/A 11/27/2013    Procedure: LEFT HEART CATHETERIZATION WITH CORONARY ANGIOGRAM;  Surgeon: [name], MD;  Location: [facility] CATH LAB;  Service: Cardiovascular;  Laterality: N/A;  . Percutaneous coronary stent intervention (pci-s)  11/27/2013    Procedure: PERCUTANEOUS CORONARY STENT INTERVENTION (PCI-S);  Surgeon: [name], MD;  Location: [facility] CATH LAB;  Service: Cardiovascular;;  . Lower  extremity angiogram N/A 02/26/2014    Procedure: LOWER EXTREMITY ANGIOGRAM;  Surgeon: [name], MD;  Location[facility] CATH LAB;  Service: Cardiovascular;  Laterality: N/A;  . Lower extremity angiogram N/A 05/28/2014    Procedure: LOWER EXTREMITY ANGIOGRAM;  Surgeon: [name], MD;  Location: [facility] CATH LAB;  Service: Cardiovascular;  Laterality: N/A;  . Esophagogastroduodenoscopy      Prior to Admission medications   Medication Sig Start Date End Date Taking? Authorizing Provider  aspirin EC 81 MG tablet Take 1 tablet (81 mg total) by mouth daily. Patient taking differently: Take 81 mg by mouth every morning.  12/11/13  Yes Daniel J Angiulli, PA-C  atorvastatin (LIPITOR) 40 MG tablet Take 40 mg by mouth every morning.  11/12/14  Yes Historical Provider, MD  carvedilol (COREG) 12.5 MG tablet Take 12.5 mg by mouth 2 (two) times daily with a meal.  11/13/14  Yes Historical Provider, MD  Cyanocobalamin (B-12) 5000 MCG CAPS Take 5,000 mcg by mouth every morning.    Yes Historical Provider, MD  ondansetron (ZOFRAN) 4 MG tablet Take 1 tab every 6 hours as needed 12/19/14  Yes Willia Craze, NP  pantoprazole (PROTONIX) 40 MG tablet Take 40 mg by mouth every morning.  11/08/14  Yes Historical Provider, MD  PHENYTEK 300 MG ER capsule Take 1 capsule (300 mg total) by mouth every morning. 08/13/14  Yes [name], MD  Psyllium (METAMUCIL PO) Take 3 capsules by mouth every evening.    Yes Historical Provider, MD  pyridOXINE (VITAMIN B-6) 100 MG tablet Take 100 mg by mouth every morning.    Yes Historical Provider, MD  Thiamine HCl (VITAMIN B-1) 250 MG tablet Take 250 mg by mouth every morning.    Yes Historical Provider, MD  prasugrel (EFFIENT) 10 MG TABS tablet Take 1 tablet (10 mg total) by mouth daily. 12/03/14   [name], MD  traMADol (ULTRAM) 50 MG tablet Take 1 tablet (50 mg total) by mouth every 6 (six) hours as needed. 12/19/14   Willia Craze, NP  valsartan (DIOVAN) 160 MG tablet Take  160 mg by mouth every morning.  11/08/14   Historical Provider, MD    Current Facility-Administered Medications  Medication Dose Route Frequency Provider Last Rate Last Dose  . 0.9 %  sodium chloride infusion   Intravenous Continuous Gatha Mayer, MD        Allergies as of 12/31/2014 - Review Complete 12/20/2014  Allergen Reaction Noted  . Oxycodone Swelling and Other (See Comments) 12/02/2013    Family History  Problem Relation Age of Onset  . Stroke Mother   . Heart disease Brother     multiple stents  . Hyperlipidemia Brother   . Emphysema Father     History   Social History  . Marital Status: Single    Spouse Name: N/A  . Number of  Children: N/A  . Years of Education: N/A   Occupational History  . Not on file.   Social History Main Topics  . Smoking status: Former Smoker -- 2.00 packs/day for 45 years    Types: Cigarettes    Quit date: 08/03/2003  . Smokeless tobacco: Current User    Types: Chew     Comment: quit  smoking 10 years agop  . Alcohol Use: No     Comment: quit 1980  . Drug Use: No     Comment: none in past 30 years   . Sexual Activity: Not on file   Other Topics Concern  . Not on file   Social History Narrative   ** Merged History Encounter **        Review of Systems: Positive for right ankle edema All other review of systems negative except as mentioned in the HPI.  Physical Exam: Vital signs in last 24 hours: BP: (133)/(65) 133/65 mmHg (06/21 0720)   General:   Alert,  Well-developed, well-nourished, pleasant and cooperative in NAD Lungs:  Clear throughout to auscultation.   Heart:  Regular rate and rhythm; no murmurs, clicks, rubs,  or gallops. Abdomen:  Soft, nontender and nondistended. Normal bowel sounds.   Neuro/Psych:  Alert and cooperative. Normal mood and affect. A and O x 3   @Carl  Simonne Maffucci, MD, [facility] [phone] (pager) 01/21/2015 7:50 AM@

## 2015-01-21 NOTE — Op Note (Signed)
[facility] [place], [id number]   ENDOSCOPY PROCEDURE REPORT  PATIENT: [name], Gilbert  MR#: [id number] BIRTHDATE: [birth year] , 64  yrs. old GENDER: male ENDOSCOPIST: Gatha Mayer, MD, [facility] PROCEDURE DATE:  01/21/2015 PROCEDURE:  EGD w/ biopsy and EGD w/ balloon dilation ASA CLASS:     Class III INDICATIONS:  dysphagia and early satiety. MEDICATIONS: Fentanyl 25 mcg IV and Versed 2 mg IV TOPICAL ANESTHETIC: Cetacaine Spray  DESCRIPTION OF PROCEDURE: After the risks benefits and alternatives of the procedure were thoroughly explained, informed consent was obtained.  The    endoscope was introduced through the mouth and advanced to the second portion of the duodenum , Without limitations.  The instrument was slowly withdrawn as the mucosa was fully examined.    1) Ring-like stricture at GE junction - dilated with 18 mm balloon, slight heme good result 2) antral erythema and mottling - gastritis - biopsied 3) Otherwise NL EGD.  Retroflexed views revealed no abnormalities.     The scope was then withdrawn from the patient and the procedure completed.  COMPLICATIONS: There were no immediate complications.  ENDOSCOPIC IMPRESSION: 1) Ring-like stricture at GE junction - dilated with 18 mm balloon, slight heme good result 2) antral erythema and mottling - gastritis - biopsied 3) Otherwise NL EGD  RECOMMENDATIONS: 1.  Clear liquids until , then soft foods rest iof day.  Resume prior diet tomorrow. 2.  Continue PPI 3.  Consider buspirone for early satiety depending upon bx results, could need a gastric emptying study given early satiety and vomiting if esophageal dilation not helpful 4.  Restart Effient tomorrow   eSigned:  Gatha Mayer, MD, [facility] 01/21/2015 8:27 AM    CC: Christen Butter, MD, The Patient and Elray Mcgregor, MD

## 2015-01-22 ENCOUNTER — Encounter (HOSPITAL_COMMUNITY): Payer: Self-pay | Admitting: Internal Medicine

## 2015-01-23 ENCOUNTER — Encounter: Payer: Self-pay | Admitting: Emergency Medicine

## 2015-01-23 ENCOUNTER — Ambulatory Visit (INDEPENDENT_AMBULATORY_CARE_PROVIDER_SITE_OTHER): Payer: 59 | Admitting: Emergency Medicine

## 2015-01-23 VITALS — BP 158/68 | HR 55 | Temp 97.7°F | Resp 16 | Ht 69.25 in | Wt 162.2 lb

## 2015-01-23 DIAGNOSIS — K222 Esophageal obstruction: Secondary | ICD-10-CM | POA: Diagnosis not present

## 2015-01-23 DIAGNOSIS — I2510 Atherosclerotic heart disease of native coronary artery without angina pectoris: Secondary | ICD-10-CM | POA: Diagnosis not present

## 2015-01-23 DIAGNOSIS — Z23 Encounter for immunization: Secondary | ICD-10-CM

## 2015-01-23 DIAGNOSIS — M25561 Pain in right knee: Secondary | ICD-10-CM | POA: Diagnosis not present

## 2015-01-23 DIAGNOSIS — G8929 Other chronic pain: Secondary | ICD-10-CM | POA: Diagnosis not present

## 2015-01-23 DIAGNOSIS — R112 Nausea with vomiting, unspecified: Secondary | ICD-10-CM | POA: Diagnosis not present

## 2015-01-23 LAB — BASIC METABOLIC PANEL WITH GFR
BUN: 17 mg/dL (ref 6–23)
CALCIUM: 9.3 mg/dL (ref 8.4–10.5)
CHLORIDE: 106 meq/L (ref 96–112)
CO2: 28 mEq/L (ref 19–32)
Creat: 1.23 mg/dL (ref 0.50–1.35)
GFR, EST NON AFRICAN AMERICAN: 62 mL/min
GFR, Est African American: 71 mL/min
Glucose, Bld: 86 mg/dL (ref 70–99)
Potassium: 4.5 mEq/L (ref 3.5–5.3)
Sodium: 141 mEq/L (ref 135–145)

## 2015-01-23 LAB — LIPID PANEL
CHOL/HDL RATIO: 3.1 ratio
Cholesterol: 163 mg/dL (ref 0–200)
HDL: 52 mg/dL (ref >=40)
LDL CALC: 96 mg/dL (ref 0–99)
Triglycerides: 73 mg/dL (ref <150)
VLDL: 15 mg/dL (ref 0–40)

## 2015-01-23 NOTE — Progress Notes (Signed)
Subjective:  This chart was scribed for [name], MD by Tamsen Roers, at [facility].  This patient was seen in room 22 and the patient's care was started at 10:23 AM.    Patient ID: Gilbert [name], male    DOB: 04-21-1951, 64 y.o.   MRN: [id number] Chief Complaint  Patient presents with  . Follow-up    4 MONTHS - per patient not sure for visit    HPI HPI Comments: Gilbert [name] is a 64 y.o. male who presents to the [facility] for a follow up.  He has been having pain in his left knee (has been wearing a brace) and states that he always has trouble with this knee.  The brace has caused slight swelling in his foot. Patient is complaint with his cholesterol medication.  Patient denies any chest pains or abdominal pain. He is unsure of his last tetanus shot and is willing to get a pneumonia shot.  Patient had an endoscopy recently which did not show any concerns.  He has no other complaints today.        Patient Active Problem List   Diagnosis Date Noted  . Dysphagia, pharyngoesophageal phase   . Early satiety   . Esophageal stricture   . Gastritis and gastroduodenitis   . Nausea with vomiting 12/20/2014  . Abdominal pain, epigastric 12/20/2014  . Peripheral artery disease 05/29/2014  . PAD (peripheral artery disease) 05/28/2014  . Cardiomyopathy, ischemic 04/03/2014  . Hx of cardiac cath 02/26/2014  . History of left heart catheterization (LHC) 02/26/2014  . Arterial bleed, intraoperative 02/26/2014  . Encephalopathy acute 12/07/2013  . Cardiac arrest 11/27/2013  . Seizures 11/27/2013  . HTN (hypertension) 11/27/2013  . Acute respiratory failure with hypoxia 11/27/2013  . Shock circulatory 11/27/2013  . Hypomagnesemia 11/27/2013  . Renal cancer 10/19/2013  . Renal mass, right 10/10/2013  . Coronary artery calcification 09/25/2013  . Renal cyst 08/30/2013  . Loss of weight 08/03/2013   Past Medical History  Diagnosis Date    . Gout   . GERD (gastroesophageal reflux disease)   . Hypertension   . Hypercholesteremia   . Shortness of breath     with activity  . History of epilepsy     at least 10 years ago. 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 Family history of anesthesia complication     mother-had stroke during anesthesia  . HTN (hypertension)   . Seizures   . History of myocardial infarction less than 8 weeks     Stent placed  . CAD (coronary artery disease)   . PAD (peripheral artery disease)   . Hyperlipemia   . Therapeutic drug monitoring   . Coronary artery calcification seen on CAT scan   . SOB (shortness of breath)   . History of tobacco use   . Systolic and diastolic CHF, chronic     Resolved. EF 45% 04/10/2014  . Essential hypertension, benign   . History of nephrectomy 11/2013    For renal cell carcinoma  . Acid reflux   . Abnormal liver enzymes     Chronic alkaline phosphatase elevation since 2014  . History of cardiac arrest 11/26/13    PTCA/Stenting of LM & Prox LAD with 4.0 x 18 mm Xience alpine stent. Used Impella Circulatory assist device. EF= 20-25%  . Coronary atherosclerosis of native coronary artery   . Cancer 08/2013    right kidney cancer  . Cancer  Past Surgical History  Procedure Laterality Date  . No past surgeries    . Eye surgery Left 2 weeks ago    torn retina  . Robot assisted laparoscopic nephrectomy Right 10/19/2013    Procedure: ROBOTIC ASSISTED LAPAROSCOPIC NEPHRECTOMY,  EXTENSIVE ADHESIOLYSIS;  Surgeon: Alexis Frock, MD;  Location: [facility];  Service: Urology;  Laterality: Right;  . Coronary angioplasty with stent placement  2015    Left main coronary artery stenting on emergent basis along with circulatory support with Impella device through left leg. With 4.0 x 18 mm Xience alpine stent  . Nephrectomy  11/2013  . Iliac artery stent Left 05/28/2014    dr Einar Gip  . Left heart catheterization with coronary angiogram N/A 11/27/2013    Procedure: LEFT HEART  CATHETERIZATION WITH CORONARY ANGIOGRAM;  Surgeon: [name], MD;  Location: [facility] CATH LAB;  Service: Cardiovascular;  Laterality: N/A;  . Percutaneous coronary stent intervention (pci-s)  11/27/2013    Procedure: PERCUTANEOUS CORONARY STENT INTERVENTION (PCI-S);  Surgeon: [name], MD;  Location: [facility] CATH LAB;  Service: Cardiovascular;;  . Lower extremity angiogram N/A 02/26/2014    Procedure: LOWER EXTREMITY ANGIOGRAM;  Surgeon: [name], MD;  Location: [facility] CATH LAB;  Service: Cardiovascular;  Laterality: N/A;  . Lower extremity angiogram N/A 05/28/2014    Procedure: LOWER EXTREMITY ANGIOGRAM;  Surgeon: [name], MD;  Location: [facility] CATH LAB;  Service: Cardiovascular;  Laterality: N/A;  . Esophagogastroduodenoscopy    . Esophagogastroduodenoscopy N/A 01/21/2015    Procedure: ESOPHAGOGASTRODUODENOSCOPY (EGD) with possible dilation.;  Surgeon: Gatha Mayer, MD;  Location: [facility] ENDOSCOPY;  Service: Endoscopy;  Laterality: N/A;  . Balloon dilation N/A 01/21/2015    Procedure: BALLOON DILATION;  Surgeon: Gatha Mayer, MD;  Location: [facility] ENDOSCOPY;  Service: Endoscopy;  Laterality: N/A;   Allergies  Allergen Reactions  . Oxycodone Swelling and Other (See Comments)    Tongue and lip   Prior to Admission medications   Medication Sig Start Date End Date Taking? Authorizing Provider  aspirin EC 81 MG tablet Take 1 tablet (81 mg total) by mouth daily. Patient taking differently: Take 81 mg by mouth every morning.  12/11/13   Lavon Paganini Angiulli, PA-C  atorvastatin (LIPITOR) 40 MG tablet Take 40 mg by mouth every morning.  11/12/14   Historical Provider, MD  carvedilol (COREG) 12.5 MG tablet Take 12.5 mg by mouth 2 (two) times daily with a meal.  11/13/14   Historical Provider, MD  Cyanocobalamin (B-12) 5000 MCG CAPS Take 5,000 mcg by mouth every morning.     Historical Provider, MD  ondansetron (ZOFRAN) 4 MG tablet Take 1 tab every 6 hours as needed 12/19/14   Willia Craze, NP   pantoprazole (PROTONIX) 40 MG tablet Take 40 mg by mouth every morning.  11/08/14   Historical Provider, MD  PHENYTEK 300 MG ER capsule Take 1 capsule (300 mg total) by mouth every morning. 08/13/14   [name], MD  prasugrel (EFFIENT) 10 MG TABS tablet Take 1 tablet (10 mg total) by mouth daily. Restart tomorrow 01/22/2015 01/21/15   Gatha Mayer, MD  Psyllium (METAMUCIL PO) Take 3 capsules by mouth every evening.     Historical Provider, MD  pyridOXINE (VITAMIN B-6) 100 MG tablet Take 100 mg by mouth every morning.     Historical Provider, MD  Thiamine HCl (VITAMIN B-1) 250 MG tablet Take 250 mg by mouth every morning.     Historical Provider, MD  traMADol (  ULTRAM) 50 MG tablet Take 1 tablet (50 mg total) by mouth every 6 (six) hours as needed. 12/19/14   Willia Craze, NP  valsartan (DIOVAN) 160 MG tablet Take 160 mg by mouth every morning.  11/08/14   Historical Provider, MD   History   Social History  . Marital Status: Single    Spouse Name: N/A  . Number of Children: N/A  . Years of Education: N/A   Occupational History  . Not on file.   Social History Main Topics  . Smoking status: Former Smoker -- 2.00 packs/day for 45 years    Types: Cigarettes    Quit date: 08/03/2003  . Smokeless tobacco: Current User    Types: Chew     Comment: quit  smoking 10 years agop  . Alcohol Use: No     Comment: quit 1980  . Drug Use: No     Comment: none in past 30 years   . Sexual Activity: Not on file   Other Topics Concern  . Not on file   Social History Narrative   ** Merged History Encounter **        Current Outpatient Prescriptions on File Prior to Visit  Medication Sig Dispense Refill  . aspirin EC 81 MG tablet Take 1 tablet (81 mg total) by mouth daily. (Patient taking differently: Take 81 mg by mouth every morning. )    . atorvastatin (LIPITOR) 40 MG tablet Take 40 mg by mouth every morning.     . carvedilol (COREG) 12.5 MG tablet Take 12.5 mg by mouth 2 (two) times daily  with a meal.     . Cyanocobalamin (B-12) 5000 MCG CAPS Take 5,000 mcg by mouth every morning.     . ondansetron (ZOFRAN) 4 MG tablet Take 1 tab every 6 hours as needed 30 tablet 1  . pantoprazole (PROTONIX) 40 MG tablet Take 40 mg by mouth every morning.     Marland Kitchen PHENYTEK 300 MG ER capsule Take 1 capsule (300 mg total) by mouth every morning. 30 capsule 11  . prasugrel (EFFIENT) 10 MG TABS tablet Take 1 tablet (10 mg total) by mouth daily. Restart tomorrow 01/22/2015 30 tablet 11  . Psyllium (METAMUCIL PO) Take 3 capsules by mouth every evening.     . pyridOXINE (VITAMIN B-6) 100 MG tablet Take 100 mg by mouth every morning.     . Thiamine HCl (VITAMIN B-1) 250 MG tablet Take 250 mg by mouth every morning.     . valsartan (DIOVAN) 160 MG tablet Take 160 mg by mouth every morning.     . traMADol (ULTRAM) 50 MG tablet Take 1 tablet (50 mg total) by mouth every 6 (six) hours as needed. (Patient not taking: Reported on 01/23/2015) 30 tablet 0   No current facility-administered medications on file prior to visit.    Allergies  Allergen Reactions  . Oxycodone Swelling and Other (See Comments)    Tongue and lip     Review of Systems  Constitutional: Negative for fever and chills.  Gastrointestinal: Negative for nausea, vomiting, abdominal pain and abdominal distention.  Musculoskeletal: Positive for arthralgias.       Objective:   Physical Exam  CONSTITUTIONAL: Well developed/well nourished HEAD: Normocephalic/atraumatic EYES: EOMI/PERRL ENMT: Mucous membranes moist NECK: supple no meningeal signs SPINE/BACK:entire spine nontender CV: S1/S2 noted, no murmurs/rubs/gallops noted LUNGS: Lungs are clear to auscultation bilaterally, no apparent distress ABDOMEN: soft, nontender, no rebound or guarding, bowel sounds noted throughout abdomen GU:no cva tenderness  NEURO: Pt is awake/alert/appropriate, moves all extremitiesx4.  No facial droop.   EXTREMITIES: pulses normal/equal, full ROM SKIN:  warm, color normal PSYCH: no abnormalities of mood noted, alert and oriented to situation   Filed Vitals:   01/23/15 0955  BP: 158/68  Pulse: 55  Temp: 97.7 F (36.5 C)  TempSrc: Oral  Resp: 16  Height: 5' 9.25" (1.759 m)  Weight: 162 lb 3.2 oz (73.573 kg)  SpO2: 100%         Assessment & Plan:  Patient stable at present. He did have a esophageal stricture which was dilated. He overall has been feeling well.I personally performed the services described in this documentation, which was scribed in my presence. The recorded information has been reviewed and is accurate.  Nena Jordan, MD

## 2015-01-29 NOTE — Progress Notes (Signed)
Quick Note:  Bxs - reactive gastropathy ______

## 2015-03-13 ENCOUNTER — Telehealth: Payer: Self-pay | Admitting: *Deleted

## 2015-03-13 ENCOUNTER — Ambulatory Visit (HOSPITAL_BASED_OUTPATIENT_CLINIC_OR_DEPARTMENT_OTHER): Payer: 59 | Admitting: Hematology & Oncology

## 2015-03-13 ENCOUNTER — Encounter (HOSPITAL_BASED_OUTPATIENT_CLINIC_OR_DEPARTMENT_OTHER): Payer: Self-pay

## 2015-03-13 ENCOUNTER — Encounter: Payer: Self-pay | Admitting: Hematology & Oncology

## 2015-03-13 ENCOUNTER — Ambulatory Visit (HOSPITAL_BASED_OUTPATIENT_CLINIC_OR_DEPARTMENT_OTHER)
Admission: RE | Admit: 2015-03-13 | Discharge: 2015-03-13 | Disposition: A | Discharge status: Home / Self Care | Payer: 59 | Source: Ambulatory Visit | Attending: Hematology & Oncology | Admitting: Hematology & Oncology

## 2015-03-13 ENCOUNTER — Other Ambulatory Visit (HOSPITAL_BASED_OUTPATIENT_CLINIC_OR_DEPARTMENT_OTHER): Payer: 59

## 2015-03-13 VITALS — BP 156/94 | HR 66 | Temp 97.2°F | Resp 16 | Ht 69.0 in | Wt 162.0 lb

## 2015-03-13 DIAGNOSIS — C641 Malignant neoplasm of right kidney, except renal pelvis: Secondary | ICD-10-CM

## 2015-03-13 DIAGNOSIS — Z905 Acquired absence of kidney: Secondary | ICD-10-CM | POA: Insufficient documentation

## 2015-03-13 DIAGNOSIS — R918 Other nonspecific abnormal finding of lung field: Secondary | ICD-10-CM | POA: Insufficient documentation

## 2015-03-13 LAB — CBC WITH DIFFERENTIAL (CANCER CENTER ONLY)
BASO#: 0.1 10*3/uL (ref 0.0–0.2)
BASO%: 0.6 % (ref 0.0–2.0)
EOS%: 9.4 % — AB (ref 0.0–7.0)
Eosinophils Absolute: 0.8 10*3/uL — ABNORMAL HIGH (ref 0.0–0.5)
HCT: 35.2 % — ABNORMAL LOW (ref 38.7–49.9)
HGB: 12.2 g/dL — ABNORMAL LOW (ref 13.0–17.1)
LYMPH#: 1.8 10*3/uL (ref 0.9–3.3)
LYMPH%: 22.2 % (ref 14.0–48.0)
MCH: 29.8 pg (ref 28.0–33.4)
MCHC: 34.7 g/dL (ref 32.0–35.9)
MCV: 86 fL (ref 82–98)
MONO#: 0.5 10*3/uL (ref 0.1–0.9)
MONO%: 6.3 % (ref 0.0–13.0)
NEUT#: 4.9 10*3/uL (ref 1.5–6.5)
NEUT%: 61.5 % (ref 40.0–80.0)
Platelets: 126 10*3/uL — ABNORMAL LOW (ref 145–400)
RBC: 4.09 10*6/uL — ABNORMAL LOW (ref 4.20–5.70)
RDW: 13.7 % (ref 11.1–15.7)
WBC: 7.9 10*3/uL (ref 4.0–10.0)

## 2015-03-13 LAB — CMP (CANCER CENTER ONLY)
ALK PHOS: 148 U/L — AB (ref 26–84)
ALT: 23 U/L (ref 10–47)
AST: 30 U/L (ref 11–38)
Albumin: 3.2 g/dL — ABNORMAL LOW (ref 3.3–5.5)
BUN: 15 mg/dL (ref 7–22)
CO2: 27 mEq/L (ref 18–33)
Calcium: 9.1 mg/dL (ref 8.0–10.3)
Chloride: 106 mEq/L (ref 98–108)
Creat: 1.2 mg/dl (ref 0.6–1.2)
Glucose, Bld: 94 mg/dL (ref 73–118)
POTASSIUM: 4.4 meq/L (ref 3.3–4.7)
SODIUM: 142 meq/L (ref 128–145)
Total Bilirubin: 0.6 mg/dl (ref 0.20–1.60)
Total Protein: 6.4 g/dL (ref 6.4–8.1)

## 2015-03-13 LAB — LACTATE DEHYDROGENASE: LDH: 165 U/L (ref 94–250)

## 2015-03-13 MED ORDER — IOHEXOL 300 MG/ML  SOLN
100.0000 mL | Freq: Once | INTRAMUSCULAR | Status: AC | PRN
  Administered 2015-03-13: 100 mL via INTRAVENOUS

## 2015-03-13 NOTE — Progress Notes (Signed)
Hematology and Oncology Follow Up Visit  Gilbert [name] [id number] Aker 30, 1952 64 y.o. 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 He is doing pretty well. He had no proms over the holidays.  He's gone through his cardiac issue. He is on aspirin. He is on Lipitor.  We did go ahead and get a CT scan on him today. This was for restaging. Thankfully, the CT scan did not show any evidence of recurrent disease. He does have a stable 5 mm left lower lobe pulmonary nodule. Otherwise, everything else looks good.  He's had a little bit of a cough. This is a dry cough.  His appetite is okay. There's been no change in bowel or bladder habits.  There has been no leg swelling. He has had no rashes.  He's had no headache.  His overall performance status is ECOG 0.  Medications:  Current outpatient prescriptions:  .  amLODipine (NORVASC) 5 MG tablet, , Disp: , Rfl:  .  aspirin EC 81 MG tablet, Take 1 tablet (81 mg total) by mouth daily. (Patient taking differently: Take 81 mg by mouth every morning. ), Disp: , Rfl:  .  atorvastatin (LIPITOR) 40 MG tablet, Take 40 mg by mouth every morning. , Disp: , Rfl:  .  carvedilol (COREG) 12.5 MG tablet, Take 12.5 mg by mouth 2 (two) times daily with a meal. , Disp: , Rfl:  .  Cyanocobalamin (B-12) 5000 MCG CAPS, Take 5,000 mcg by mouth every morning. , Disp: , Rfl:  .  ondansetron (ZOFRAN) 4 MG tablet, Take 1 tab every 6 hours as needed, Disp: 30 tablet, Rfl: 1 .  pantoprazole (PROTONIX) 40 MG tablet, Take 40 mg by mouth every morning. , Disp: , Rfl:  .  PHENYTEK 300 MG ER capsule, Take 1 capsule (300 mg total) by mouth every morning., Disp: 30 capsule, Rfl: 11 .  prasugrel (EFFIENT) 10 MG TABS tablet, Take 1 tablet (10 mg total) by mouth daily. Restart tomorrow 01/22/2015, Disp: 30 tablet, Rfl: 11 .  Psyllium (METAMUCIL PO),  Take 3 capsules by mouth every evening. , Disp: , Rfl:  .  pyridOXINE (VITAMIN B-6) 100 MG tablet, Take 100 mg by mouth every morning. , Disp: , Rfl:  .  Thiamine HCl (VITAMIN B-1) 250 MG tablet, Take 250 mg by mouth every morning. , Disp: , Rfl:  .  valsartan (DIOVAN) 160 MG tablet, Take 160 mg by mouth every morning. , Disp: , Rfl:  .  traMADol (ULTRAM) 50 MG tablet, Take 1 tablet (50 mg total) by mouth every 6 (six) hours as needed. (Patient not taking: Reported on 03/13/2015), Disp: 30 tablet, Rfl: 0  Allergies:  Allergies  Allergen Reactions  . Oxycodone Swelling and Other (See Comments)    Tongue and lip    Past Medical History, Surgical history, Social history, and Family History were reviewed and updated.  Review of Systems: As above  Physical Exam:  height is 5\' 9"  (1.753 m) and weight is 162 lb (73.483 kg). His oral temperature is 97.2 F (36.2 C). His blood pressure is 156/94 and his pulse is 66. His respiration is 16.   Well-developed and well-nourished white male. Head and neck exam shows no ocular or oral lesions. He has no adenopathy in the neck. Lungs are clear. Cardiac exam regular rate and rhythm with no murmurs rubs or  bruits. Abdomen is soft. He has laparoscopy scars that are well-healed from his right nephrectomy. There is no fluid wave. There is no palpable liver or spleen tip. Back exam shows no tenderness over the spine ribs or hips. Extremities shows no clubbing cyanosis or edema. Skin exam no rashes, ecchymoses or petechia. Neurological exam is nonfocal.  Lab Results  Component Value Date   WBC 7.9 03/13/2015   HGB 12.2* 03/13/2015   HCT 35.2* 03/13/2015   MCV 86 03/13/2015   PLT 126* 03/13/2015     Chemistry      Component Value Date/Time   NA 142 03/13/2015 0919   NA 141 01/23/2015 1040   K 4.4 03/13/2015 0919   K 4.5 01/23/2015 1040   CL 106 03/13/2015 0919   CL 106 01/23/2015 1040   CO2 27 03/13/2015 0919   CO2 28 01/23/2015 1040   BUN 15  03/13/2015 0919   BUN 17 01/23/2015 1040   CREATININE 1.2 03/13/2015 0919   CREATININE 1.33 12/19/2014 1440      Component Value Date/Time   CALCIUM 9.1 03/13/2015 0919   CALCIUM 9.3 01/23/2015 1040   ALKPHOS 148* 03/13/2015 0919   ALKPHOS 189* 08/13/2014 1113   AST 30 03/13/2015 0919   AST 18 08/13/2014 1113   ALT 23 03/13/2015 0919   ALT 10 08/13/2014 1113   BILITOT 0.60 03/13/2015 0919   BILITOT 0.5 08/13/2014 1113         Impression and Plan: Gilbert [name] is 64 year old gentleman with stage III clear cell cancer of the right kidney. He underwent laparoscopic nephrectomy in March of 2015. His scan today does not show any evidence of recurrent disease.  I think that we can move his appointment out to 6 months now. We will get another scan on him we see him back.   Volanda Napoleon, MD 8/11/201611:24 AM I told her that

## 2015-03-13 NOTE — Telephone Encounter (Addendum)
Estill Bamberg, daughter, aware of results.   ----- Message from Volanda Napoleon, MD sent at 03/13/2015 11:34 AM EDT ----- Call - CT scan shows no recurrent cancer!!  pete

## 2015-03-27 ENCOUNTER — Ambulatory Visit: Payer: 59 | Admitting: Emergency Medicine

## 2015-03-28 ENCOUNTER — Ambulatory Visit (INDEPENDENT_AMBULATORY_CARE_PROVIDER_SITE_OTHER): Payer: 59 | Admitting: Emergency Medicine

## 2015-03-28 VITALS — BP 142/80 | HR 65 | Temp 97.8°F | Resp 16 | Ht 70.25 in | Wt 165.2 lb

## 2015-03-28 DIAGNOSIS — I10 Essential (primary) hypertension: Secondary | ICD-10-CM

## 2015-03-28 DIAGNOSIS — C649 Malignant neoplasm of unspecified kidney, except renal pelvis: Secondary | ICD-10-CM | POA: Diagnosis not present

## 2015-03-28 DIAGNOSIS — I2510 Atherosclerotic heart disease of native coronary artery without angina pectoris: Secondary | ICD-10-CM | POA: Diagnosis not present

## 2015-03-28 NOTE — Progress Notes (Signed)
Patient ID: Gilbert [name], male   DOB: January 15, 1951, 64 y.o.   MRN: [id number]    This chart was scribed for Nena Jordan, MD by [facility], medical scribe at [facility].The patient was seen in exam room 09 and the patient's care was started at 11:44 AM.  Chief Complaint:  Chief Complaint  Patient presents with  . Follow-up    Coronary artery disease   HPI: Gilbert [name] is a 64 y.o. male with a who reports to [facility] today for a follow up for CAD. No chest pain, and shortness of breath. Has not smoked a cigarette in 30 years. Declines the flu shot today. He does complain of arthritis in his knees.  Past Medical History  Diagnosis Date  . Gout   . GERD (gastroesophageal reflux disease)   . Hypertension   . Hypercholesteremia   . Shortness of breath     with activity  . History of epilepsy     at least 10 years ago. 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 Family history of anesthesia complication     mother-had stroke during anesthesia  . HTN (hypertension)   . Seizures   . History of myocardial infarction less than 8 weeks     Stent placed  . CAD (coronary artery disease)   . PAD (peripheral artery disease)   . Hyperlipemia   . Therapeutic drug monitoring   . Coronary artery calcification seen on CAT scan   . SOB (shortness of breath)   . History of tobacco use   . Systolic and diastolic CHF, chronic     Resolved. EF 45% 04/10/2014  . Essential hypertension, benign   . History of nephrectomy 11/2013    For renal cell carcinoma  . Acid reflux   . Abnormal liver enzymes     Chronic alkaline phosphatase elevation since 2014  . History of cardiac arrest 11/26/13    PTCA/Stenting of LM & Prox LAD with 4.0 x 18 mm Xience alpine stent. Used Impella Circulatory assist device. EF= 20-25%  . Coronary atherosclerosis of native coronary artery   . Cancer 08/2013    right kidney cancer  . Cancer    Past Surgical History  Procedure Laterality Date  . No past surgeries      . Eye surgery Left 2 weeks ago    torn retina  . Robot assisted laparoscopic nephrectomy Right 10/19/2013    Procedure: ROBOTIC ASSISTED LAPAROSCOPIC NEPHRECTOMY,  EXTENSIVE ADHESIOLYSIS;  Surgeon: Alexis Frock, MD;  Location: [facility] ORS;  Service: Urology;  Laterality: Right;  . Coronary angioplasty with stent placement  2015    Left main coronary artery stenting on emergent basis along with circulatory support with Impella device through left leg. With 4.0 x 18 mm Xience alpine stent  . Nephrectomy  11/2013  . Iliac artery stent Left 05/28/2014    dr Einar Gip  . Left heart catheterization with coronary angiogram N/A 11/27/2013    Procedure: LEFT HEART CATHETERIZATION WITH CORONARY ANGIOGRAM;  Surgeon: [name], MD;  Location: [facility] CATH LAB;  Service: Cardiovascular;  Laterality: N/A;  . Percutaneous coronary stent intervention (pci-s)  11/27/2013    Procedure: PERCUTANEOUS CORONARY STENT INTERVENTION (PCI-S);  Surgeon: [name], MD;  Location: [facility] CATH LAB;  Service: Cardiovascular;;  . Lower extremity angiogram N/A 02/26/2014    Procedure: LOWER EXTREMITY ANGIOGRAM;  Surgeon: [name], MD;  Location: [facility] CATH LAB;  Service: Cardiovascular;  Laterality: N/A;  . Lower extremity angiogram  N/A 05/28/2014    Procedure: LOWER EXTREMITY ANGIOGRAM;  Surgeon: [name], MD;  Location: [facility] CATH LAB;  Service: Cardiovascular;  Laterality: N/A;  . Esophagogastroduodenoscopy    . Esophagogastroduodenoscopy N/A 01/21/2015    Procedure: ESOPHAGOGASTRODUODENOSCOPY (EGD) with possible dilation.;  Surgeon: Gatha Mayer, MD;  Location: [facility] ENDOSCOPY;  Service: Endoscopy;  Laterality: N/A;  . Balloon dilation N/A 01/21/2015    Procedure: BALLOON DILATION;  Surgeon: Gatha Mayer, MD;  Location: [facility] ENDOSCOPY;  Service: Endoscopy;  Laterality: N/A;   Social History   Social History  . Marital Status: Single    Spouse Name: N/A  . Number of Children: N/A  . Years of Education: N/A    Social History Main Topics  . Smoking status: Former Smoker -- 2.00 packs/day for 45 years    Types: Cigarettes    Quit date: 08/03/2003  . Smokeless tobacco: Current User    Types: Chew     Comment: quit  smoking 10 years agop  . Alcohol Use: No     Comment: quit 1980  . Drug Use: No     Comment: none in past 30 years   . Sexual Activity: Not Asked   Other Topics Concern  . None   Social History Narrative   ** Merged History Encounter **       Family History  Problem Relation Age of Onset  . Stroke Mother   . Heart disease Brother     multiple stents  . Hyperlipidemia Brother   . Emphysema Father    Allergies  Allergen Reactions  . Oxycodone Swelling and Other (See Comments)    Tongue and lip   Prior to Admission medications   Medication Sig Start Date End Date Taking? Authorizing Provider  amLODipine (NORVASC) 5 MG tablet  01/11/15  Yes Historical Provider, MD  aspirin EC 81 MG tablet Take 1 tablet (81 mg total) by mouth daily. Patient taking differently: Take 81 mg by mouth every morning.  12/11/13  Yes Daniel J Angiulli, PA-C  atorvastatin (LIPITOR) 40 MG tablet Take 40 mg by mouth every morning.  11/12/14  Yes Historical Provider, MD  carvedilol (COREG) 12.5 MG tablet Take 12.5 mg by mouth 2 (two) times daily with a meal.  11/13/14  Yes Historical Provider, MD  Cyanocobalamin (B-12) 5000 MCG CAPS Take 5,000 mcg by mouth every morning.    Yes Historical Provider, MD  pantoprazole (PROTONIX) 40 MG tablet Take 40 mg by mouth every morning.  11/08/14  Yes Historical Provider, MD  PHENYTEK 300 MG ER capsule Take 1 capsule (300 mg total) by mouth every morning. 08/13/14  Yes [name], MD  prasugrel (EFFIENT) 10 MG TABS tablet Take 1 tablet (10 mg total) by mouth daily. Restart tomorrow 01/22/2015 01/21/15  Yes Gatha Mayer, MD  Psyllium (METAMUCIL PO) Take 3 capsules by mouth every evening.    Yes Historical Provider, MD  pyridOXINE (VITAMIN B-6) 100 MG tablet Take 100  mg by mouth every morning.    Yes Historical Provider, MD  Thiamine HCl (VITAMIN B-1) 250 MG tablet Take 250 mg by mouth every morning.    Yes Historical Provider, MD  valsartan (DIOVAN) 160 MG tablet Take 160 mg by mouth every morning.  11/08/14  Yes Historical Provider, MD  ondansetron (ZOFRAN) 4 MG tablet Take 1 tab every 6 hours as needed Patient not taking: Reported on 03/28/2015 12/19/14   Willia Craze, NP  traMADol (ULTRAM) 50 MG tablet Take 1  tablet (50 mg total) by mouth every 6 (six) hours as needed. Patient not taking: Reported on 03/13/2015 12/19/14   Willia Craze, NP    ROS: The patient denies fevers, chills, night sweats, unintentional weight loss, chest pain, palpitations, wheezing, dyspnea on exertion, nausea, vomiting, abdominal pain, dysuria, hematuria, melena, numbness, weakness, or tingling.  All other systems have been reviewed and were otherwise negative with the exception of those mentioned in the HPI and as above.    PHYSICAL EXAM: Filed Vitals:   03/28/15 1133  BP: 142/80  Pulse: 65  Temp: 97.8 F (36.6 C)  Resp: 16   Body mass index is 23.54 kg/(m^2).  General: Alert, no acute distress HEENT:  Normocephalic, atraumatic, oropharynx patent. Eye: [facility]te M[facility] Cardiovascular:  Regular rate and rhythm, no rubs murmurs or gallops.  No Carotid bruits, radial pulse intact. No pedal edema.  Respiratory: Clear to auscultation bilaterally.  No wheezes, rales, or rhonchi.  No cyanosis, no use of accessory musculature Abdominal: No organomegaly, abdomen is soft and non-tender, positive bowel sounds.  No masses. Musculoskeletal: Gait intact. No edema, tenderness Skin: No rashes. Neurologic: Facial musculature symmetric. Psychiatric: Patient acts appropriately throughout our interaction. Lymphatic: No cervical or submandibular lymphadenopathy Genitourinary/Anorectal: No acute findings   LABS: Results for orders placed or performed in visit on 03/13/15  CBC with  [facility])  Result Value Ref Range   WBC 7.9 4.0 - 10.0 10e3/uL   RBC 4.09 (L) 4.20 - 5.70 10e6/uL   HGB 12.2 (L) 13.0 - 17.1 g/dL   HCT 35.2 (L) 38.7 - 49.9 %   MCV 86 82 - 98 fL   MCH 29.8 28.0 - 33.4 pg   MCHC 34.7 32.0 - 35.9 g/dL   RDW 13.7 11.1 - 15.7 %   Platelets 126 (L) 145 - 400 10e3/uL   NEUT# 4.9 1.5 - 6.5 10e3/uL   LYMPH# 1.8 0.9 - 3.3 10e3/uL   MONO# 0.5 0.1 - 0.9 10e3/uL   Eosinophils Absolute 0.8 (H) 0.0 - 0.5 10e3/uL   BASO# 0.1 0.0 - 0.2 10e3/uL   NEUT% 61.5 40.0 - 80.0 %   LYMPH% 22.2 14.0 - 48.0 %   MONO% 6.3 0.0 - 13.0 %   EOS% 9.4 (H) 0.0 - 7.0 %   BASO% 0.6 0.0 - 2.0 %  Lactate dehydrogenase  Result Value Ref Range   LDH 165 94 - 250 U/L  COMPREHENSIVE METABOLIC PANEL ([facility] REFLEX ONLY)  Result Value Ref Range   Sodium 142 128 - 145 mEq/L   Potassium 4.4 3.3 - 4.7 mEq/L   Chloride 106 98 - 108 mEq/L   CO2 27 18 - 33 mEq/L   Glucose, Bld 94 73 - 118 mg/dL   BUN, Bld 15 7 - 22 mg/dL   Creat 1.2 0.6 - 1.2 mg/dl   Total Bilirubin 0.60 0.20 - 1.60 mg/dl   Alkaline Phosphatase 148 (H) 26 - 84 U/L   AST 30 11 - 38 U/L   ALT(SGPT) 23 10 - 47 U/L   Total Protein 6.4 6.4 - 8.1 g/dL   Albumin 3.2 (L) 3.3 - 5.5 g/dL   Calcium 9.1 8.0 - 10.3 mg/dL    EKG/XRAY:   Primary read interpreted by [facility] Everlene Farrier at [facility].  ASSESSMENT/PLAN: Patient is stable. He is doing well. We'll recheck in about 3 months. He continues to do dip but not smoking. He does not want to stop. He declined a flu shot. He saw the oncologist recently no evidence  of metastatic disease. He has been stable from a cardiac standpoint and due to see Dr. Einar Gip  in about 2 months. Gross sideeffects, risk and benefits, and alternatives of medications d/w patient. Patient is aware that all medications have potential sideeffects and we are unable to predict every sideeffect or drug-drug interaction that [place] occur.I personally performed the services described in this documentation, which  was scribed in my presence. The recorded information has been reviewed and is accurate.    [name] MD 03/28/2015 11:44 AM

## 2015-05-04 ENCOUNTER — Ambulatory Visit (INDEPENDENT_AMBULATORY_CARE_PROVIDER_SITE_OTHER): Payer: 59 | Admitting: Emergency Medicine

## 2015-05-04 ENCOUNTER — Ambulatory Visit (INDEPENDENT_AMBULATORY_CARE_PROVIDER_SITE_OTHER): Payer: 59

## 2015-05-04 VITALS — BP 136/68 | HR 64 | Temp 97.7°F | Resp 16 | Ht 70.25 in | Wt 155.0 lb

## 2015-05-04 DIAGNOSIS — I2510 Atherosclerotic heart disease of native coronary artery without angina pectoris: Secondary | ICD-10-CM | POA: Diagnosis not present

## 2015-05-04 DIAGNOSIS — R7989 Other specified abnormal findings of blood chemistry: Secondary | ICD-10-CM | POA: Diagnosis not present

## 2015-05-04 DIAGNOSIS — R1013 Epigastric pain: Secondary | ICD-10-CM | POA: Diagnosis not present

## 2015-05-04 DIAGNOSIS — R778 Other specified abnormalities of plasma proteins: Secondary | ICD-10-CM

## 2015-05-04 DIAGNOSIS — M25512 Pain in left shoulder: Secondary | ICD-10-CM

## 2015-05-04 LAB — TROPONIN I: Troponin I: 0.08 ng/mL — ABNORMAL HIGH (ref <0.06)

## 2015-05-04 MED ORDER — TRIAMCINOLONE ACETONIDE 40 MG/ML IJ SUSP
40.0000 mg | Freq: Once | INTRAMUSCULAR | Status: AC
  Administered 2015-05-04: 40 mg via INTRA_ARTICULAR

## 2015-05-04 NOTE — Progress Notes (Addendum)
This chart was scribed for Gilbert Queen, MD by Moises Blood, Medical Scribe. This patient was seen in Room 14 and the patient's care was started 3:06 PM.  Chief Complaint:  Chief Complaint  Patient presents with  . Leg Pain    bruise on lower left leg  . Shoulder Problem    left shouulder    HPI: Gilbert [name] is a 64 y.o. male who reports to [facility] today complaining of leg pain with a bruise over his left ankle.  He denies any knowledge of hitting his foot. He is on blood thinners. He denies any pain with the bruise.   He's having appetite change due to reflux, and vomiting. He can't eat anything because he's afraid of vomiting it back up. Yesterday, he had to force down toast with grape jelly.   He also notes having left shoulder pain. He noticed it after playing golf. He wants to feel better soon because there are golf tournaments coming up.   Past Medical History  Diagnosis Date  . Gout   . GERD (gastroesophageal reflux disease)   . Hypertension   . Hypercholesteremia   . Shortness of breath     with activity  . History of epilepsy     at least 10 years ago. 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 Family history of anesthesia complication     mother-had stroke during anesthesia  . HTN (hypertension)   . Seizures ([place])   . History of myocardial infarction less than 8 weeks     Stent placed  . CAD (coronary artery disease)   . PAD (peripheral artery disease) ([place])   . Hyperlipemia   . Therapeutic drug monitoring   . Coronary artery calcification seen on CAT scan   . SOB (shortness of breath)   . History of tobacco use   . Systolic and diastolic CHF, chronic (HCC)     Resolved. EF 45% 04/10/2014  . Essential hypertension, benign   . History of nephrectomy 11/2013    For renal cell carcinoma  . Acid reflux   . Abnormal liver enzymes     Chronic alkaline phosphatase elevation since 2014  . History of cardiac arrest 11/26/13    PTCA/Stenting of LM & Prox LAD with 4.0 x 18  mm Xience alpine stent. Used Impella Circulatory assist device. EF= 20-25%  . Coronary atherosclerosis of native coronary artery   . Cancer ([place]) 08/2013    right kidney cancer  . Cancer [facility])    Past Surgical History  Procedure Laterality Date  . No past surgeries    . Eye surgery Left 2 weeks ago    torn retina  . Robot assisted laparoscopic nephrectomy Right 10/19/2013    Procedure: ROBOTIC ASSISTED LAPAROSCOPIC NEPHRECTOMY,  EXTENSIVE ADHESIOLYSIS;  Surgeon: Alexis Frock, MD;  Location: [facility] ORS;  Service: Urology;  Laterality: Right;  . Coronary angioplasty with stent placement  2015    Left main coronary artery stenting on emergent basis along with circulatory support with Impella device through left leg. With 4.0 x 18 mm Xience alpine stent  . Nephrectomy  11/2013  . Iliac artery stent Left 05/28/2014    dr Einar Gip  . Left heart catheterization with coronary angiogram N/A 11/27/2013    Procedure: LEFT HEART CATHETERIZATION WITH CORONARY ANGIOGRAM;  Surgeon: [name], MD;  Location: [facility] CATH LAB;  Service: Cardiovascular;  Laterality: N/A;  . Percutaneous coronary stent intervention (pci-s)  11/27/2013    Procedure: PERCUTANEOUS CORONARY STENT INTERVENTION (  PCI-S);  Surgeon: [name], MD;  Location: [facility] CATH LAB;  Service: Cardiovascular;;  . Lower extremity angiogram N/A 02/26/2014    Procedure: LOWER EXTREMITY ANGIOGRAM;  Surgeon: [name], MD;  Location[facility] CATH LAB;  Service: Cardiovascular;  Laterality: N/A;  . Lower extremity angiogram N/A 05/28/2014    Procedure: LOWER EXTREMITY ANGIOGRAM;  Surgeon: [name], MD;  Location: [facility] CATH LAB;  Service: Cardiovascular;  Laterality: N/A;  . Esophagogastroduodenoscopy    . Esophagogastroduodenoscopy N/A 01/21/2015    Procedure: ESOPHAGOGASTRODUODENOSCOPY (EGD) with possible dilation.;  Surgeon: Gatha Mayer, MD;  Location: [facility] ENDOSCOPY;  Service: Endoscopy;  Laterality: N/A;  . Balloon dilation N/A  01/21/2015    Procedure: BALLOON DILATION;  Surgeon: Gatha Mayer, MD;  Location: [facility] ENDOSCOPY;  Service: Endoscopy;  Laterality: N/A;   Social History   Social History  . Marital Status: Single    Spouse Name: N/A  . Number of Children: N/A  . Years of Education: N/A   Social History Main Topics  . Smoking status: Former Smoker -- 2.00 packs/day for 45 years    Types: Cigarettes    Quit date: 08/03/2003  . Smokeless tobacco: Current User    Types: Chew     Comment: quit  smoking 10 years agop  . Alcohol Use: No     Comment: quit 1980  . Drug Use: No     Comment: none in past 30 years   . Sexual Activity: Not Asked   Other Topics Concern  . None   Social History Narrative   ** Merged History Encounter **       Family History  Problem Relation Age of Onset  . Stroke Mother   . Heart disease Brother     multiple stents  . Hyperlipidemia Brother   . Emphysema Father    Allergies  Allergen Reactions  . Oxycodone Swelling and Other (See Comments)    Tongue and lip   Prior to Admission medications   Medication Sig Start Date End Date Taking? Authorizing Provider  amLODipine (NORVASC) 5 MG tablet  01/11/15   Historical Provider, MD  aspirin EC 81 MG tablet Take 1 tablet (81 mg total) by mouth daily. Patient taking differently: Take 81 mg by mouth every morning.  12/11/13   Lavon Paganini Angiulli, PA-C  atorvastatin (LIPITOR) 40 MG tablet Take 40 mg by mouth every morning.  11/12/14   Historical Provider, MD  carvedilol (COREG) 12.5 MG tablet Take 12.5 mg by mouth 2 (two) times daily with a meal.  11/13/14   Historical Provider, MD  Cyanocobalamin (B-12) 5000 MCG CAPS Take 5,000 mcg by mouth every morning.     Historical Provider, MD  ondansetron (ZOFRAN) 4 MG tablet Take 1 tab every 6 hours as needed Patient not taking: Reported on 03/28/2015 12/19/14   Gilbert Craze, NP  pantoprazole (PROTONIX) 40 MG tablet Take 40 mg by mouth every morning.  11/08/14   Historical Provider, MD    PHENYTEK 300 MG ER capsule Take 1 capsule (300 mg total) by mouth every morning. 08/13/14   [name], MD  prasugrel (EFFIENT) 10 MG TABS tablet Take 1 tablet (10 mg total) by mouth daily. Restart tomorrow 01/22/2015 01/21/15   Gatha Mayer, MD  Psyllium (METAMUCIL PO) Take 3 capsules by mouth every evening.     Historical Provider, MD  pyridOXINE (VITAMIN B-6) 100 MG tablet Take 100 mg by mouth every morning.     Historical  Provider, MD  Thiamine HCl (VITAMIN B-1) 250 MG tablet Take 250 mg by mouth every morning.     Historical Provider, MD  traMADol (ULTRAM) 50 MG tablet Take 1 tablet (50 mg total) by mouth every 6 (six) hours as needed. Patient not taking: Reported on 03/13/2015 12/19/14   Gilbert Craze, NP  valsartan (DIOVAN) 160 MG tablet Take 160 mg by mouth every morning.  11/08/14   Historical Provider, MD     ROS:  Constitutional: negative for chills, fever, night sweats, weight changes, or fatigue; positive for appetite change HEENT: negative for vision changes, hearing loss, congestion, rhinorrhea, ST, epistaxis, or sinus pressure Cardiovascular: negative for chest pain or palpitations Respiratory: negative for hemoptysis, wheezing, shortness of breath, or cough Abdominal: negative for abdominal pain, nausea, diarrhea, or constipation; positive for vomiting Dermatological: positive for ecchymosis (left ankle)  Neurologic: negative for headache, dizziness, or syncope Musc: positive for arthralgia (left shoulder) All other systems reviewed and are otherwise negative with the exception to those above and in the HPI.  PHYSICAL EXAM: Filed Vitals:   05/04/15 1358  BP: 136/68  Pulse: 64  Temp: 97.7 F (36.5 C)  Resp: 16   Body mass index is 22.09 kg/(m^2).   General: Alert, no acute distress HEENT:  Normocephalic, atraumatic, oropharynx patent. Eye: [facility] M[facility] Cardiovascular:  Regular rate and rhythm, no rubs murmurs or gallops.  No Carotid bruits; he was resuscitated  at home  Respiratory: Clear to auscultation bilaterally.  No wheezes, rales, or rhonchi.  No cyanosis, no use of accessory musculature Abdominal: No organomegaly, abdomen is soft and non-tender, positive bowel sounds. No masses. Musculoskeletal: left shoulder: significant crepitation internal and external rotation Skin: No rashes. Neurologic: Facial musculature symmetric. Psychiatric: Patient acts appropriately throughout our interaction.  Lymphatic: No cervical or submandibular lymphadenopathy Genitourinary/Anorectal: No acute findings PROCEDURE NOTE The subdeltoid area was prepped with Betadine 2 alcohol swab use the skin was numbed with ethyl chloride and subdeltoid area was injected with 40 of Kenalog and 2 mL of 2% plain. Patient tolerated the procedure well. LABS:    EKG/XRAY:   Primary read interpreted by Dr. Everlene Farrier at [facility]. He has calcification in the left  [facility] joint. Otherwise there is arthritic changes no acute abnormalities. EKG there is evidence of a previous anteroseptal MI. The lateral ST changes [place] be more pronounced.  ASSESSMENT/PLAN: We'll proceed with troponin level. I will be in contact with Dr.Ganji regarding his status. Patient is aware I will be calling him tonight with lab results. Troponin level returned elevated at 0.08. Normal levels less than 0.06 Patient denies any chest pain or cardiac symptoms. Dr. Einar Gip to evaluate patient tomorrow.  By signing my name below, I, Moises Blood, attest that this documentation has been prepared under the direction and in the presence of Gilbert Queen, MD. Electronically Signed: Moises Blood, Ashley. 05/04/2015 , 3:06 PM . I personally performed the services described in this documentation, which was scribed in my presence. The recorded information has been reviewed and is accurate.   Gross sideeffects, risk and benefits, and alternatives of medications d/w patient. Patient is aware that all medications have potential sideeffects  and we are unable to predict every sideeffect or drug-drug interaction that [place] occur.  Gilbert Queen MD 05/04/2015 3:06 PM

## 2015-05-04 NOTE — Patient Instructions (Signed)
Impingement Syndrome, Rotator Cuff, Bursitis  with Rehab  Impingement syndrome is a condition that involves inflammation of the tendons of the rotator cuff and the subacromial bursa, that causes pain in the shoulder. The rotator cuff consists of four tendons and muscles that control much of the shoulder and upper arm function. The subacromial bursa is a fluid filled sac that helps reduce friction between the rotator cuff and one of the bones of the shoulder (acromion). Impingement syndrome is usually an overuse injury that causes swelling of the bursa (bursitis), swelling of the tendon (tendonitis), and/or a tear of the tendon (strain). Strains are classified into three categories. Grade 1 strains cause pain, but the tendon is not lengthened. Grade 2 strains include a lengthened ligament, due to the ligament being stretched or partially ruptured. With grade 2 strains there is still function, although the function Guillot be decreased. Grade 3 strains include a complete tear of the tendon or muscle, and function is usually impaired.  SYMPTOMS   · Pain around the shoulder, often at the outer portion of the upper arm.  · Pain that gets worse with shoulder function, especially when reaching overhead or lifting.  · Sometimes, aching when not using the arm.  · Pain that wakes you up at night.  · Sometimes, tenderness, swelling, warmth, or redness over the affected area.  · Loss of strength.  · Limited motion of the shoulder, especially reaching behind the back (to the back pocket or to unhook bra) or across your body.  · Crackling sound (crepitation) when moving the arm.  · Biceps tendon pain and inflammation (in the front of the shoulder). Worse when bending the elbow or lifting.  CAUSES   Impingement syndrome is often an overuse injury, in which chronic (repetitive) motions cause the tendons or bursa to become inflamed. A strain occurs when a force is paced on the tendon or muscle that is greater than it can withstand.  Common mechanisms of injury include:  Stress from sudden increase in duration, frequency, or intensity of training.  · Direct hit (trauma) to the shoulder.  · Aging, erosion of the tendon with normal use.  · Bony bump on shoulder (acromial spur).  RISK INCREASES WITH:  · Contact sports (football, wrestling, boxing).  · Throwing sports (baseball, tennis, volleyball).  · Weightlifting and bodybuilding.  · Heavy labor.  · Previous injury to the rotator cuff, including impingement.  · Poor shoulder strength and flexibility.  · Failure to warm up properly before activity.  · Inadequate protective equipment.  · Old age.  · Bony bump on shoulder (acromial spur).  PREVENTION   · Warm up and stretch properly before activity.  · Allow for adequate recovery between workouts.  · Maintain physical fitness:  ¨ Strength, flexibility, and endurance.  ¨ Cardiovascular fitness.  · Learn and use proper exercise technique.  PROGNOSIS   If treated properly, impingement syndrome usually goes away within 6 weeks. Sometimes surgery is required.   RELATED COMPLICATIONS   · Longer healing time if not properly treated, or if not given enough time to heal.  · Recurring symptoms, that result in a chronic condition.  · Shoulder stiffness, frozen shoulder, or loss of motion.  · Rotator cuff tendon tear.  · Recurring symptoms, especially if activity is resumed too soon, with overuse, with a direct blow, or when using poor technique.  TREATMENT   Treatment first involves the use of ice and medicine, to reduce pain and inflammation. The use of   MEDICATION °· If pain medicine is needed, nonsteroidal anti-inflammatory medicines (aspirin and  ibuprofen), or other minor pain relievers (acetaminophen), are often advised. °· Do not take pain medicine for 7 days before surgery. °· Prescription pain relievers [place] be given, if your caregiver thinks they are needed. Use only as directed and only as much as you need. °· Corticosteroid injections [place] be given by your caregiver. These injections should be reserved for the most serious cases, because they [place] only be given a certain number of times. °HEAT AND COLD °· Cold treatment (icing) should be applied for 10 to 15 minutes every 2 to 3 hours for inflammation and pain, and immediately after activity that aggravates your symptoms. Use ice packs or an ice massage. °· Heat treatment Tennis be used before performing stretching and strengthening activities prescribed by your caregiver, physical therapist, or athletic trainer. Use a heat pack or a warm water soak. °SEEK MEDICAL CARE IF:  °· Symptoms get worse or do not improve in 4 to 6 weeks, despite treatment. °· New, unexplained symptoms develop. (Drugs used in treatment [place] produce side effects.) ° °

## 2015-05-05 LAB — COMPLETE METABOLIC PANEL WITH GFR
ALBUMIN: 3.5 g/dL — AB (ref 3.6–5.1)
ALK PHOS: 165 U/L — AB (ref 40–115)
ALT: 11 U/L (ref 9–46)
AST: 20 U/L (ref 10–35)
BILIRUBIN TOTAL: 0.5 mg/dL (ref 0.2–1.2)
BUN: 18 mg/dL (ref 7–25)
CO2: 26 mmol/L (ref 20–31)
CREATININE: 1.36 mg/dL — AB (ref 0.70–1.25)
Calcium: 8.9 mg/dL (ref 8.6–10.3)
Chloride: 103 mmol/L (ref 98–110)
GFR, Est African American: 63 mL/min (ref >=60)
GFR, Est Non African American: 55 mL/min — ABNORMAL LOW (ref >=60)
GLUCOSE: 73 mg/dL (ref 65–99)
Potassium: 3.8 mmol/L (ref 3.5–5.3)
SODIUM: 140 mmol/L (ref 135–146)
TOTAL PROTEIN: 6.6 g/dL (ref 6.1–8.1)

## 2015-05-05 LAB — LIPASE: Lipase: 39 U/L (ref 7–60)

## 2015-05-05 LAB — AMYLASE: Amylase: 139 U/L — ABNORMAL HIGH (ref 0–105)

## 2015-05-06 ENCOUNTER — Encounter: Payer: Self-pay | Admitting: Emergency Medicine

## 2015-06-08 NOTE — H&P (Signed)
OFFICE VISIT NOTES COPIED TO EPIC FOR DOCUMENTATION  Gilbert [name]. Gilbert [name] 05/20/2015 8:34 AM Location: [facility] Patient #: [id number] DOB: 11/21/1950 Divorced / Language: Gilbert [name] / Race: White Male   History of Present Illness Gilbert Page MD; 05/20/2015 6:05 PM) The patient is a 64 year old male who presents for a follow-up for Coronary artery disease. He has h/o myocardial infarction (h/o nephrectomy in [place] 2015 for renal cell carcinoma. He was found unresponsive in recliner 11/26/2013 with unknown down time, underwent CPR, patient in multiorgan failure, underwent left main coronary artery PTCA with 4.0 x 18 mm Xience alpine stent. ), dyslipidemia and peripheral vascular disease (Occluded left iliac artery after Impella Device used for circulatory assist during LM stenting. Underwent Peripheral arteriogram 05/28/2014: Left external/common iliac artery stenting 6.0 x 120 mm, 8.0 x 80 mm Absolute Pro self-expanding stent.  Current treatment includes sublingual nitroglycerin, antiplatelet medications, beta blockers, nitrates, ACE inhibitors and statins. By report there is good compliance with treatment. Pertinent medical history includes prior myocardial infarction, dyslipidemia and peripheral vascular disease. The patient is currently able to do activities of daily living without limitations. Denies symptoms of claudication. Still uses smokeless tobacco (dip). We had seen him about 2 weeks ago for left shoulder pain, which is completely resolved on steroid injection and he has continued to enjoy his golf game without significant restriction. He denies symptoms of claudication.  Due to symptoms of left arm discomfort, he underwent lab evaluation including troponin and BNP. He also underwent stress testing, but was unable to complete the test due to symptoms suggestive of claudication hence had to be changed towards [facility] and echocardiogram and he now presents for follow-up.  No new symptomatology.   Problem List/Past Medical Gilbert [name]; 05/20/2015 3:33 PM) Benign essential hypertension (I10) Labs 05/05/2015: Troponin negative, creatinine 1.33, eGFR 56, potassium 4.0 Labs 11/19/2014: creatinine 1.34, eGFR 56, alkaline phosphatase mildly elevated at 187, CMP otherwise normal, CBC normal Labs 08/13/2014: HB 13.1/HCT 37.4, normal platelet count. CMP was normal except elevated alkaline phosphatase at 189, chronic and stable. Serum creatinine 1.15. Hyperlipidemia, group A (E78.00) Labs 11/19/2014: Total cholesterol 218, triglycerides 168, HDL 47, LDL 137, LDL particle # [id number], LP-IR score 29, creatinine 1.34, eGFR 56, alkaline phosphatase mildly elevated at 187, CMP otherwise normal, CBC normal Labs 08/13/2014: Lipid profile revealed total cholesterol 184, triglycerides 120, HDL 50, LDL 110. No change from 02/20/2014. 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 No recent episodes. Shortness of breath (R06.02) Coronary artery calcification seen on CAT scan (I25.10) Atherosclerosis of native coronary artery of native heart without angina pectoris (I25.10) Presented with C-arrest 11/26/2013: PTCA/Stenting of LM and Proximal LAD with 4.0 x 18 mm Xience alpine stent. Used Impella Circulatory assist device. EF 20-25% Lexiscan sestamibi stress test 05/09/2015: 1. The resting electrocardiogram demonstrated normal sinus rhythm, LVH, no resting arrhythmias and nonspecific ST-T changes. Cannot exclude lateral ischemia. Stress EKG is nondiagnostic for ischemia visits for pharmacologic stress test. Patient initially attempted treadmill stress test, but unable to complete greater than 2 minutes due to leg fatigue. Stress converted to [place]. There were occasional PVCs. 2. Perfusion imaging study demonstrates a moderate to severely ischemia distal anteroseptal wall, basal inferolateral wall. The defect size is small to medium. The left ventricle is dilated with mild  global hypokinesis and anterolateral hypokinesis. Left ventricle and diastolic volume was 765 mL. The left ventricle systolic function calculated by QGS was 30%. This represents a high risk study in view of history  of left main stenting and proximal LAD stenting. Consider further cardiac workup. Echocardiogram 05/06/2015: Left ventricle cavity is mildly dilated. Mild concentric hypertrophy of the left ventricle. Doppler evidence of grade II (pseudonormal) diastolic dysfunction. Diastolic dysfunction findings suggests elevated LA/LV end diastolic pressure. Left ventricle regional wall motion findings: Basal anteroseptal, Basal anterior, Basal inferoseptal, Mid anteroseptal, Mid anterior, Mid inferoseptal, Apical anterior, Apical septal and Apical cap hypokinesis. Visual EF is 45-36%, LV systolic function mild to moderately reduced. Left atrial cavity is mildly dilated. Mild to moderate anteriorly directed mitral regurgitation. IVC is normal with blunted respiratory response, suggests elevated right heart pressure. No significant change since prior study 04/10/2014. PAD (peripheral artery disease) (I73.9) Peripheral arteriogram 05/28/2014: Left external/common iliac artery stenting 6.0 x 120 mm, 8.0 x 80 mm Absolute Pro self-expanding stent. 99% to 0%. Abdominal aortic duplex & ABI 12/19/2014: Mild plaque. Normal iliac velocity. Normal Bilateral ABI. Patent left iliac stent. 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 Referred to orthopedics. Current use of beta blocker (Z79.899) Nausea and vomiting in adult (R11.2) Acute pain of left shoulder (M25.512) Seizures (R56.9) Gout (M10.9) Acid reflux (K21.9) Abnormal liver enzymes (R74.8) Chronic alkaline phosphatase elevation since 10/12/12 Single kidney (Z90.5)10/19/2013 Right nephrectomy for renal cell cancer 10/20/2014 H/O cardiac arrest (Z86.74)11/26/2013  C-arrest on 11/26/2013: PTCA/Stenting of LM and Proximal LAD with 4.0 x 18 mm Xience alpine stent. Used Impella Circulatory assist device. EF 20-25% Acute on chronic diastolic CHF (congestive heart failure) (I50.33)04/10/2014 Echocardiogram 04/10/2014: 1. Left ventricle cavity is normal in size. Moderate concentric hypertrophy of the left ventricle. Left ventricle regional wall motion findings: Mid anteroseptal, Mid inferoseptal, Apical anterior and Apical septal hypokinesis. Calculated EF 45%. 2. Left atrial cavity is mild to moderately dilated. 3. Compared to the study done on 02/21/2014, Calculated EF was 37%, visual EF is 25-30%. This represents an improvement in global LVEF. Gastroesophageal reflux disease with esophagitis (K21.0)01/21/2015 01/21/2015: Ring like stricutre at GE junction S/P Balloon dilatation. GERD, Erosive gastritis: Dr. Silvano Rusk.  Allergies (Charavina [name]; 05/21/15 3:33 PM) Hydrocodone-Acetaminophen *ANALGESICS - OPIOID* tongue swells  Family History Gilbert [name]; May 21, 2015 3:33 PM) Mother Deceased. at age 23, from a stroke, no other known heart issues Father Deceased. in Oct 13, 1978, from emphysema, no known heart issues Siblings 3, 1 brother has multipe stents (age 44)  Social History ([place] [name]; 05/21/15 3:33 PM) Current tobacco use Former smoker. quit 10 years ago Pt uses Dip Non Drinker/No Alcohol Use Marital status Single. Number of Children 2. Living Situation Lives with relatives.  Past Surgical History Gilbert [name]; 05/21/2015 3:33 PM) Nephrectomy13-Mar-2015 Right. Due to Stage III Cancer  Medication History (Charavina [name]; 05-21-15 3:35 PM) AmLODIPine Besylate (10MG  Tablet, 1 (one) Tablet Oral daily, Taken starting 05/05/2015) Active. (Increase from 5 mg to 10mg ) Carvedilol (12.5MG  Tablet, 1 Tablet Tablet Oral two times daily, Taken starting 02/14/2015) Active. Valsartan (160MG  Tablet, 1 (one) Tablet Tablet Oral daily, Taken  starting 02/05/2015) Active. Atorvastatin Calcium (80MG  Tablet, 1 Tablet Tablet Oral daily, Taken starting 12/05/2014) Active. (Disregard 40mg  Rx) Protonix (40MG  Tablet DR, 1 (one) Tablet DR Tablet DR Ta Oral daily, Taken starting 11/08/2014) Active. Effient (10MG  Tablet, 1 Tablet Tablet Oral daily, Taken starting 12/19/2013) Active. Metamucil (0.52GM Capsule, 3 Oral daily) Active. Bayer Aspirin EC Low Dose (81MG  Tablet DR, 1 Oral daily) Active. Vitamin B1 250 mg (Free Text) (1 daily) Active. Vitamin B6 100mg  (Free Text) (1 daily) Active. Vitamin B12 5069mcg (Free Text) (1 daily) Active.  Phenytek ('300MG'$  Capsule, 1 Oral every morning) Active. Medications Reconciled (verbally with patient/ no list or medication present)  Diagnostic Studies History Gilbert [name]; 05/20/2015 8:36 AM) Nuclear stress test10/01/2015 1. The resting electrocardiogram demonstrated normal sinus rhythm, LVH, no resting arrhythmias and nonspecific ST-T changes. Cannot exclude lateral ischemia. Stress EKG is nondiagnostic for ischemia visits for pharmacologic stress test. Patient initially attempted treadmill stress test, but unable to complete greater than 2 minutes due to leg fatigue. Stress converted to [place]. There were occasional PVCs. 2. Perfusion imaging study demonstrates a moderate to severely ischemia distal anteroseptal wall, basal inferolateral wall. The defect size is small to medium. The left ventricle is dilated with mild global hypokinesis and anterolateral hypokinesis. Left ventricle and diastolic volume was 412 mL. The left ventricle systolic function calculated by QGS was 30%. This represents a high risk study in view of history of left main stenting and proximal LAD stenting. Consider further cardiac workup. Echocardiogram10/11/2014 Left ventricle cavity is mildly dilated. Mild concentric hypertrophy of the left ventricle. Doppler evidence of grade II (pseudonormal) diastolic dysfunction.  Diastolic dysfunction findings suggests elevated LA/LV end diastolic pressure. Left ventricle regional wall motion findings: Basal anteroseptal, Basal anterior, Basal inferoseptal, Mid anteroseptal, Mid anterior, Mid inferoseptal, Apical anterior, Apical septal and Apical cap hypokinesis. Visual EF is 87-86%, LV systolic function mild to moderately reduced. Left atrial cavity is mildly dilated. Mild to moderate anteriorly directed mitral regurgitation. IVC is normal with blunted respiratory response, suggests elevated right heart pressure. No significant change since prior study 04/10/2014.    Review of Systems Gilbert Page MD; 05/20/2015 6:00 PM) General Present- Weight Loss. Not Present- Chills, Fever and Night Sweats. Skin Not Present- Itching and Rash. HEENT Not Present- Headache. Respiratory Not Present- Cough, Decreased Exercise Tolerance and Difficulty Breathing on Exertion. Cardiovascular Present- Difficulty Breathing On Exertion (mild), Orthopnea and Paroxysmal Nocturnal Dyspnea. Not Present- Chest Pain, Claudications, Difficulty Breathing Lying Down, Fainting, Irregular Heart Beat and Swelling of Extremities. Gastrointestinal Present- Belching (worsening) and Indigestion. Not Present- Abdominal Pain, Bloody Stool, Diarrhea, Dysphagia, Nausea and Vomiting. Musculoskeletal Present- Joint Pain (left shoulder. Received steroid injection 05/04/2015, symptoms improved). Neurological Present- Tingling (feet). Not Present- Headaches. Hematology Not Present- Blood Clots, Easy Bruising and Nose Bleed.  Vitals Gilbert [name]; 05/20/2015 3:34 PM) 05/20/2015 3:34 PM Weight: 155 lb Height: 70.5in Body Surface Area: 1.88 m Body Mass Index: 21.93 kg/m  Pulse: 68 (Regular)  P.OX: 97% (Room air) BP: 122/88 (Sitting, Left Arm, Standard)       Physical Exam Gilbert Page, MD; 05/20/2015 6:7 PM) General Mental Status-Alert. General Appearance-Cooperative, Appears stated  age, Not in acute distress. Orientation-Oriented X3. Build & Nutrition-Well built.  Head and Neck Thyroid Gland Characteristics - no palpable nodules, no palpable enlargement.  Chest and Lung Exam Palpation Tender - No chest wall tenderness. Auscultation Breath sounds - Clear.  Cardiovascular Inspection Jugular vein - Right - No Distention. Auscultation Heart Sounds - S1 WNL, S2 WNL and No gallop present. Murmurs & Other Heart Sounds - Murmur - No murmur.  Abdomen Palpation/Percussion Normal exam - No hepatosplenomegaly. Tenderness - Epigastrium. Auscultation Normal exam - Bowel sounds normal.  Peripheral Vascular Lower Extremity Inspection - Left - No Pigmentation, No Varicose veins. Right - Pigmented(right ankle with mild ecchymosis probably torn capillary or small venule), No Varicose veins. Palpation - Edema - Bilateral - No edema. Femoral pulse - Left - 2+. Right - Normal. Bilateral - Bruit. Popliteal pulse - Bilateral - Normal. Dorsalis pedis pulse - Bilateral -  Normal. Posterior tibial pulse - Bilateral - 1+. Carotid arteries - Bilateral-No Carotid bruit. Abdomen-No prominent abdominal aortic pulsation, No epigastric bruit.  Neurologic Motor-Grossly intact without any focal deficits.  Musculoskeletal Global Assessment Left Lower Extremity - normal range of motion without pain. Right Lower Extremity - normal range of motion without pain.   Assessment & Plan Gilbert Page MD; 05/20/2015 6:06 PM) Atherosclerosis of native coronary artery of native heart without angina pectoris (I25.10) Story: Presented with C-arrest 11/26/2013: PTCA/Stenting of LM and Proximal LAD with 4.0 x 18 mm Xience alpine stent. Used Impella Circulatory assist device. EF 20-25%  Lexiscan sestamibi stress test 05/09/2015: 1. The resting electrocardiogram demonstrated normal sinus rhythm, LVH, no resting arrhythmias and nonspecific ST-T changes. Cannot exclude lateral ischemia.  Stress EKG is nondiagnostic for ischemia visits for pharmacologic stress test. Patient initially attempted treadmill stress test, but unable to complete greater than 2 minutes due to leg fatigue. Stress converted to [place]. There were occasional PVCs. 2. Perfusion imaging study demonstrates a moderate to severely ischemia distal anteroseptal wall, basal inferolateral wall. The defect size is small to medium. The left ventricle is dilated with mild global hypokinesis and anterolateral hypokinesis. Left ventricle and diastolic volume was 665 mL. The left ventricle systolic function calculated by QGS was 30%. This represents a high risk study in view of history of left main stenting and proximal LAD stenting. Consider further cardiac workup. Impression: EKG 05/05/2015: Normal Sinus rhythm at the rate of 62 bpm, borderline left atrial abnormality, LVH with repolarization abnormality, cannot exclude inferior and lateral ischemia. No significant change from EKG 12/05/2014 Future Plans 06/02/2015: CBC & PLATELETS (AUTO) [phone]) - one time 08/08/7937: METABOLIC PANEL, BASIC (03009) - one time 06/03/2015: CBC & PLATELETS (AUTO) (23300) - one time 06/03/2015: PT (PROTHROMBIN TIME) (76226) - one time Shortness of breath (R06.02) Story: Echocardiogram 05/06/2015: Left ventricle cavity is mildly dilated. Mild concentric hypertrophy of the left ventricle. Doppler evidence of grade II (pseudonormal) diastolic dysfunction. Diastolic dysfunction findings suggests elevated LA/LV end diastolic pressure. Left ventricle regional wall motion findings: Basal anteroseptal, Basal anterior, Basal inferoseptal, Mid anteroseptal, Mid anterior, Mid inferoseptal, Apical anterior, Apical septal and Apical cap hypokinesis. Visual EF is 33-35%, LV systolic function mild to moderately reduced. Left atrial cavity is mildly dilated. Mild to moderate anteriorly directed mitral regurgitation. IVC is normal with blunted respiratory  response, suggests elevated right heart pressure. No significant change since prior study 04/10/2014. Acute on chronic diastolic CHF (congestive heart failure) - Status is Resolved (I50.33) Impression: BNP 05/05/2015:1121, suggests acute on chronic systolic and diastolic or acute on chronic diastolic heart failure Single kidney (Z90.5) Story: Right nephrectomy for renal cell cancer 10/20/2014 Gastroesophageal reflux disease with esophagitis (K21.0) Story: 01/21/2015: Ring like stricutre at GE junction S/P Balloon dilatation. GERD, Erosive gastritis: Dr. Silvano Rusk.  Current Plans Mechanism of underlying disease process and action of medications discussed with the patient. I discussed primary/secondary prevention and also dietary counceling was done. Patient previously presented with sudden cardiac arrest/death, needed resuscitation, never had any chest discomfort. Hence he was abnormal stress test and left main stenting, I have recommended that he proceed with repeat coronary angiography, clinically high risk. He is already on appropriate medical therapy. Patient is willing to proceed. Schedule for cardiac catheterization, and possible angioplasty. We discussed regarding risks, benefits, alternatives to this including stress testing, CTA and continued medical therapy. Patient wants to proceed. Understands <1-2% risk of death, stroke, MI, urgent CABG, bleeding, infection, renal failure but not limited  to these. Office visit after the tests. Addendum Note(Bridgette Allison AGNP-C; 06/04/2015 5:35 PM) 06/04/2015: Creatinine 1.27, potassium 4.5, BMP normal, Ht 12.1/HCT 34.3 with normocytic indices, CBC otherwise normal, PT 10.0, INR 1.0     Signed by Gilbert Page, MD (05/20/2015 6:07 PM)

## 2015-06-10 ENCOUNTER — Encounter (HOSPITAL_COMMUNITY)
Admission: RE | Disposition: A | Discharge status: Home / Self Care | Payer: Self-pay | Source: Ambulatory Visit | Attending: Cardiology

## 2015-06-10 ENCOUNTER — Ambulatory Visit (HOSPITAL_COMMUNITY)
Admission: RE | Admit: 2015-06-10 | Discharge: 2015-06-10 | Disposition: A | Discharge status: Home / Self Care | Payer: 59 | Source: Ambulatory Visit | Attending: Cardiology | Admitting: Cardiology

## 2015-06-10 DIAGNOSIS — I110 Hypertensive heart disease with heart failure: Secondary | ICD-10-CM | POA: Diagnosis present

## 2015-06-10 DIAGNOSIS — I2510 Atherosclerotic heart disease of native coronary artery without angina pectoris: Secondary | ICD-10-CM | POA: Diagnosis not present

## 2015-06-10 DIAGNOSIS — M109 Gout, unspecified: Secondary | ICD-10-CM | POA: Insufficient documentation

## 2015-06-10 DIAGNOSIS — I340 Nonrheumatic mitral (valve) insufficiency: Secondary | ICD-10-CM | POA: Insufficient documentation

## 2015-06-10 DIAGNOSIS — Z7982 Long term (current) use of aspirin: Secondary | ICD-10-CM | POA: Insufficient documentation

## 2015-06-10 DIAGNOSIS — Z905 Acquired absence of kidney: Secondary | ICD-10-CM | POA: Diagnosis not present

## 2015-06-10 DIAGNOSIS — Z8553 Personal history of malignant neoplasm of renal pelvis: Secondary | ICD-10-CM | POA: Diagnosis not present

## 2015-06-10 DIAGNOSIS — E785 Hyperlipidemia, unspecified: Secondary | ICD-10-CM | POA: Diagnosis not present

## 2015-06-10 DIAGNOSIS — R569 Unspecified convulsions: Secondary | ICD-10-CM | POA: Insufficient documentation

## 2015-06-10 DIAGNOSIS — I739 Peripheral vascular disease, unspecified: Secondary | ICD-10-CM | POA: Insufficient documentation

## 2015-06-10 DIAGNOSIS — Z955 Presence of coronary angioplasty implant and graft: Secondary | ICD-10-CM | POA: Diagnosis not present

## 2015-06-10 DIAGNOSIS — I252 Old myocardial infarction: Secondary | ICD-10-CM | POA: Insufficient documentation

## 2015-06-10 DIAGNOSIS — K210 Gastro-esophageal reflux disease with esophagitis: Secondary | ICD-10-CM | POA: Diagnosis not present

## 2015-06-10 DIAGNOSIS — I5043 Acute on chronic combined systolic (congestive) and diastolic (congestive) heart failure: Secondary | ICD-10-CM | POA: Insufficient documentation

## 2015-06-10 DIAGNOSIS — Z95820 Peripheral vascular angioplasty status with implants and grafts: Secondary | ICD-10-CM | POA: Insufficient documentation

## 2015-06-10 DIAGNOSIS — I2582 Chronic total occlusion of coronary artery: Secondary | ICD-10-CM | POA: Diagnosis not present

## 2015-06-10 HISTORY — PX: CARDIAC CATHETERIZATION: SHX172

## 2015-06-10 SURGERY — LEFT HEART CATH AND CORONARY ANGIOGRAPHY
Anesthesia: LOCAL

## 2015-06-10 MED ORDER — IOHEXOL 350 MG/ML SOLN
INTRAVENOUS | Status: DC | PRN
Start: 2015-06-10 — End: 2015-06-10
  Administered 2015-06-10: 55 mL via INTRAVENOUS

## 2015-06-10 MED ORDER — NITROGLYCERIN 1 MG/10 ML FOR IR/CATH LAB
INTRA_ARTERIAL | Status: AC
  Filled 2015-06-10: qty 10

## 2015-06-10 MED ORDER — HYDROMORPHONE HCL 1 MG/ML IJ SOLN
INTRAMUSCULAR | Status: AC
  Filled 2015-06-10: qty 1

## 2015-06-10 MED ORDER — VERAPAMIL HCL 2.5 MG/ML IV SOLN
INTRAVENOUS | Status: AC
  Filled 2015-06-10: qty 2

## 2015-06-10 MED ORDER — HYDROMORPHONE HCL 1 MG/ML IJ SOLN
INTRAMUSCULAR | Status: DC | PRN
  Administered 2015-06-10: 0.5 mg via INTRAVENOUS

## 2015-06-10 MED ORDER — VERAPAMIL HCL 2.5 MG/ML IV SOLN
INTRA_ARTERIAL | Status: DC | PRN
  Administered 2015-06-10: 5 mL via INTRA_ARTERIAL

## 2015-06-10 MED ORDER — SODIUM CHLORIDE 0.9 % IV SOLN
INTRAVENOUS | Status: DC
  Administered 2015-06-10: 1000 mL via INTRAVENOUS

## 2015-06-10 MED ORDER — ASPIRIN 81 MG PO CHEW
81.0000 mg | CHEWABLE_TABLET | ORAL | Status: DC
Start: 2015-06-10 — End: 2015-06-10

## 2015-06-10 MED ORDER — SODIUM CHLORIDE 0.9 % IV SOLN: 250.0000 mL | INTRAVENOUS | Status: DC | PRN

## 2015-06-10 MED ORDER — SODIUM CHLORIDE 0.9 % IJ SOLN: 3.0000 mL | INTRAMUSCULAR | Status: DC | PRN

## 2015-06-10 MED ORDER — LIDOCAINE HCL (PF) 1 % IJ SOLN
INTRAMUSCULAR | Status: DC | PRN
  Administered 2015-06-10: 2 mL via INTRADERMAL

## 2015-06-10 MED ORDER — MIDAZOLAM HCL 2 MG/2ML IJ SOLN
INTRAMUSCULAR | Status: DC | PRN
  Administered 2015-06-10: 2 mg via INTRAVENOUS

## 2015-06-10 MED ORDER — NITROGLYCERIN 1 MG/10 ML FOR IR/CATH LAB
INTRA_ARTERIAL | Status: DC | PRN
  Administered 2015-06-10: 08:00:00

## 2015-06-10 MED ORDER — SODIUM CHLORIDE 0.9 % IV SOLN: INTRAVENOUS | Status: DC

## 2015-06-10 MED ORDER — LIDOCAINE HCL (PF) 1 % IJ SOLN
INTRAMUSCULAR | Status: AC
  Filled 2015-06-10: qty 30

## 2015-06-10 MED ORDER — MIDAZOLAM HCL 2 MG/2ML IJ SOLN
INTRAMUSCULAR | Status: AC
  Filled 2015-06-10: qty 4

## 2015-06-10 MED ORDER — HEPARIN SODIUM (PORCINE) 1000 UNIT/ML IJ SOLN
INTRAMUSCULAR | Status: AC
  Filled 2015-06-10: qty 1

## 2015-06-10 MED ORDER — SODIUM CHLORIDE 0.9 % IJ SOLN: 3.0000 mL | Freq: Two times a day (BID) | INTRAMUSCULAR | Status: DC

## 2015-06-10 MED ORDER — HEPARIN SODIUM (PORCINE) 1000 UNIT/ML IJ SOLN
INTRAMUSCULAR | Status: DC | PRN
Start: 2015-06-10 — End: 2015-06-10
  Administered 2015-06-10: 5000 [IU] via INTRAVENOUS

## 2015-06-10 SURGICAL SUPPLY — 8 items
CATH OPTITORQUE TIG 4.0 5F (CATHETERS) ×2 IMPLANT
DEVICE RAD COMP TR BAND LRG (VASCULAR PRODUCTS) ×2 IMPLANT
GLIDESHEATH SLEND A-KIT 6F 20G (SHEATH) ×2 IMPLANT
KIT HEART LEFT (KITS) ×2 IMPLANT
PACK CARDIAC CATHETERIZATION (CUSTOM PROCEDURE TRAY) ×2 IMPLANT
TRANSDUCER W/STOPCOCK (MISCELLANEOUS) ×2 IMPLANT
TUBING CIL FLEX 10 FLL-RA (TUBING) ×2 IMPLANT
WIRE SAFE-T 1.5MM-J .035X260CM (WIRE) ×2 IMPLANT

## 2015-06-10 NOTE — Interval H&P Note (Signed)
History and Physical Interval Note:  06/10/2015 6:25 AM  Gilbert [name]  has presented today for surgery, with the diagnosis of abnormal stress test  The various methods of treatment have been discussed with the patient and family. After consideration of risks, benefits and other options for treatment, the patient has consented to  Procedure(s): Left Heart Cath and Coronary Angiography (N/A)  And possible PCI as a surgical intervention .  The patient's history has been reviewed, patient examined, no change in status, stable for surgery.  I have reviewed the patient's chart and labs.  Questions were answered to the patient's satisfaction.   Ischemic Symptoms? CCS III (Marked limitation of ordinary activity) Anti-ischemic Medical Therapy? Maximal Medical Therapy (2 or more classes of medications) Non-invasive Test Results? High-risk stress test findings: cardiac mortality >3%/yr Prior CABG? No Previous CABG   Patient Information:   1-2V CAD, no prox LAD  A (9)  Indication: 19; Score: 9   Patient Information:   CTO of 1 vessel, no other CAD  A (8)  Indication: 29; Score: 8   Patient Information:   1V CAD with prox LAD  A (9)  Indication: 35; Score: 9   Patient Information:   2V-CAD with prox LAD  A (9)  Indication: 41; Score: 9   Patient Information:   3V-CAD without LMCA  A (9)  Indication: 47; Score: 9   Patient Information:   3V-CAD without LMCA With Abnormal LV systolic function  A (9)  Indication: 48; Score: 9   Patient Information:   LMCA-CAD  A (9)  Indication: 49; Score: 9   Patient Information:   2V-CAD with prox LAD PCI  A (7)  Indication: 62; Score: 7   Patient Information:   2V-CAD with prox LAD CABG  A (8)  Indication: 62; Score: 8   Patient Information:   3V-CAD without LMCA With Low CAD burden(i.e., 3 focal stenoses, low SYNTAX score) PCI  A (7)  Indication: 63; Score: 7   Patient Information:   3V-CAD without  LMCA With Low CAD burden(i.e., 3 focal stenoses, low SYNTAX score) CABG  A (9)  Indication: 63; Score: 9   Patient Information:   3V-CAD without LMCA E06c - Intermediate-high CAD burden (i.e., multiple diffuse lesions, presence of CTO, or high SYNTAX score) PCI  U (4)  Indication: 64; Score: 4   Patient Information:   3V-CAD without LMCA E06c - Intermediate-high CAD burden (i.e., multiple diffuse lesions, presence of CTO, or high SYNTAX score) CABG  A (9)  Indication: 64; Score: 9   Patient Information:   LMCA-CAD With Isolated LMCA stenosis  PCI  U (6)  Indication: 65; Score: 6   Patient Information:   LMCA-CAD With Isolated LMCA stenosis  CABG  A (9)  Indication: 65; Score: 9   Patient Information:   LMCA-CAD Additional CAD, low CAD burden (i.e., 1- to 2-vessel additional involvement, low SYNTAX score) PCI  U (5)  Indication: 66; Score: 5   Patient Information:   LMCA-CAD Additional CAD, low CAD burden (i.e., 1- to 2-vessel additional involvement, low SYNTAX score) CABG  A (9)  Indication: 66; Score: 9   Patient Information:   LMCA-CAD Additional CAD, intermediate-high CAD burden (i.e., 3-vessel involvement, presence of CTO, or high SYNTAX score) PCI  I (3)  Indication: 67; Score: 3   Patient Information:   LMCA-CAD Additional CAD, intermediate-high CAD burden (i.e., 3-vessel involvement, presence of CTO, or high SYNTAX score) CABG  A (9)  Indication: 67; Score:  [place]

## 2015-06-10 NOTE — Discharge Instructions (Signed)
Radial Site Care °Refer to this sheet in the next few weeks. These instructions provide you with information about caring for yourself after your procedure. Your health care provider [place] also give you more specific instructions. Your treatment has been planned according to current medical practices, but problems sometimes occur. Call your health care provider if you have any problems or questions after your procedure. °WHAT TO EXPECT AFTER THE PROCEDURE °After your procedure, it is typical to have the following: °· Bruising at the radial site that usually fades within 1-2 weeks. °· Blood collecting in the tissue (hematoma) that [place] be painful to the touch. It should usually decrease in size and tenderness within 1-2 weeks. °HOME CARE INSTRUCTIONS °· Take medicines only as directed by your health care provider. °· You [place] shower 24-48 hours after the procedure or as directed by your health care provider. Remove the bandage (dressing) and gently wash the site with plain soap and water. Pat the area dry with a clean towel. Do not rub the site, because this [place] cause bleeding. °· Do not take baths, swim, or use a hot tub until your health care provider approves. °· Check your insertion site every day for redness, swelling, or drainage. °· Do not apply powder or lotion to the site. °· Do not flex or bend the affected arm for 24 hours or as directed by your health care provider. °· Do not push or pull heavy objects with the affected arm for 24 hours or as directed by your health care provider. °· Do not lift over 10 lb (4.5 kg) for 5 days after your procedure or as directed by your health care provider. °· Ask your health care provider when it is okay to: °¨ Return to work or school. °¨ Resume usual physical activities or sports. °¨ Resume sexual activity. °· Do not drive home if you are discharged the same day as the procedure. Have someone else drive you. °· You [place] drive 24 hours after the procedure unless otherwise  instructed by your health care provider. °· Do not operate machinery or power tools for 24 hours after the procedure. °· If your procedure was done as an outpatient procedure, which means that you went home the same day as your procedure, a responsible adult should be with you for the first 24 hours after you arrive home. °· Keep all follow-up visits as directed by your health care provider. This is important. °SEEK MEDICAL CARE IF: °· You have a fever. °· You have chills. °· You have increased bleeding from the radial site. Hold pressure on the site. °SEEK IMMEDIATE MEDICAL CARE IF: °· You have unusual pain at the radial site. °· You have redness, warmth, or swelling at the radial site. °· You have drainage (other than a small amount of blood on the dressing) from the radial site. °· The radial site is bleeding, and the bleeding does not stop after 30 minutes of holding steady pressure on the site. °· Your arm or hand becomes pale, cool, tingly, or numb. °  °This information is not intended to replace advice given to you by your health care provider. Make sure you discuss any questions you have with your health care provider. °  °Document Released: 08/21/2010 Document Revised: 08/09/2014 Document Reviewed: 02/04/2014 °Elsevier Interactive Patient Education ©2016 [employer] ° °

## 2015-06-11 ENCOUNTER — Encounter (HOSPITAL_COMMUNITY): Payer: Self-pay | Admitting: Cardiology

## 2015-06-12 ENCOUNTER — Telehealth: Payer: Self-pay

## 2015-06-12 ENCOUNTER — Other Ambulatory Visit: Payer: Self-pay | Admitting: Emergency Medicine

## 2015-06-12 DIAGNOSIS — H538 Other visual disturbances: Secondary | ICD-10-CM

## 2015-06-12 NOTE — Telephone Encounter (Signed)
Pt stopped in and would like a referral to an eye doctor. I advised him that Dr Everlene Farrier will be here over the weekend. He said he [place] come in then [phone]

## 2015-07-08 ENCOUNTER — Ambulatory Visit: Payer: 59 | Admitting: Emergency Medicine

## 2015-09-05 ENCOUNTER — Telehealth: Payer: Self-pay

## 2015-09-05 ENCOUNTER — Encounter: Payer: Self-pay | Admitting: Hematology & Oncology

## 2015-09-05 ENCOUNTER — Encounter: Payer: Self-pay | Admitting: Emergency Medicine

## 2015-09-05 NOTE — Telephone Encounter (Signed)
Pt would like refill Original Order:  PHENYTEK 300 MG ER capsule [id number]  Pt's last OV was 05/2015 original rx expired 08/2015  Advised pharmacist that pt [place] need ov prior to fill please call to advise  [phone] and PT

## 2015-09-06 ENCOUNTER — Ambulatory Visit (INDEPENDENT_AMBULATORY_CARE_PROVIDER_SITE_OTHER): Payer: BLUE CROSS/BLUE SHIELD | Admitting: Emergency Medicine

## 2015-09-06 VITALS — BP 140/68 | HR 60 | Temp 98.0°F | Resp 20 | Ht 69.0 in | Wt 169.5 lb

## 2015-09-06 DIAGNOSIS — R569 Unspecified convulsions: Secondary | ICD-10-CM

## 2015-09-06 LAB — PHENYTOIN LEVEL, TOTAL: Phenytoin Lvl: 4.7 ug/mL — ABNORMAL LOW (ref 10.0–20.0)

## 2015-09-06 MED ORDER — PHENYTEK 300 MG PO CAPS: 300.0000 mg | ORAL_CAPSULE | Freq: Every morning | ORAL | Status: DC

## 2015-09-06 NOTE — Progress Notes (Signed)
By signing my name below, I, Moises Blood, attest that this documentation has been prepared under the direction and in the presence of [name], MD. Electronically Signed: Moises Blood, [place]. 09/06/2015 , 2:06 PM .  Patient was seen in room 13 .  Chief Complaint:  Chief Complaint  Patient presents with  . Medication Refill    Phenytex    HPI: Gilbert [name] is a 65 y.o. male who reports to [facility] today for medication refill on his phenytek (phenytoin).  He's doing generally well. He denies chest pain. He's had history of seizures.   He had kidney surgery about 2 years ago (10/19/2013).   Past Medical History  Diagnosis Date  . Gout   . GERD (gastroesophageal reflux disease)   . Hypertension   . Hypercholesteremia   . Shortness of breath     with activity  . History of epilepsy     at least 10 years ago. 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 Family history of anesthesia complication     mother-had stroke during anesthesia  . HTN (hypertension)   . Seizures ([place])   . History of myocardial infarction less than 8 weeks     Stent placed  . CAD (coronary artery disease)   . PAD (peripheral artery disease) ([place])   . Hyperlipemia   . Therapeutic drug monitoring   . Coronary artery calcification seen on CAT scan   . SOB (shortness of breath)   . History of tobacco use   . Systolic and diastolic CHF, chronic (HCC)     Resolved. EF 45% 04/10/2014  . Essential hypertension, benign   . History of nephrectomy 11/2013    For renal cell carcinoma  . Acid reflux   . Abnormal liver enzymes     Chronic alkaline phosphatase elevation since 2014  . History of cardiac arrest 11/26/13    PTCA/Stenting of LM & Prox LAD with 4.0 x 18 mm Xience alpine stent. Used Impella Circulatory assist device. EF= 20-25%  . Coronary atherosclerosis of native coronary artery   . Cancer ([place]) 08/2013    right kidney cancer  . Cancer [facility])    Past Surgical History  Procedure Laterality Date  . No  past surgeries    . Eye surgery Left 2 weeks ago    torn retina  . Robot assisted laparoscopic nephrectomy Right 10/19/2013    Procedure: ROBOTIC ASSISTED LAPAROSCOPIC NEPHRECTOMY,  EXTENSIVE ADHESIOLYSIS;  Surgeon: Alexis Frock, MD;  Location: [facility] ORS;  Service: Urology;  Laterality: Right;  . Coronary angioplasty with stent placement  2015    Left main coronary artery stenting on emergent basis along with circulatory support with Impella device through left leg. With 4.0 x 18 mm Xience alpine stent  . Nephrectomy  11/2013  . Iliac artery stent Left 05/28/2014    dr Einar Gip  . Left heart catheterization with coronary angiogram N/A 11/27/2013    Procedure: LEFT HEART CATHETERIZATION WITH CORONARY ANGIOGRAM;  Surgeon: [name], MD;  Location: [facility] CATH LAB;  Service: Cardiovascular;  Laterality: N/A;  . Percutaneous coronary stent intervention (pci-s)  11/27/2013    Procedure: PERCUTANEOUS CORONARY STENT INTERVENTION (PCI-S);  Surgeon: [name], MD;  Location: [facility] CATH LAB;  Service: Cardiovascular;;  . Lower extremity angiogram N/A 02/26/2014    Procedure: LOWER EXTREMITY ANGIOGRAM;  Surgeon: [name], MD;  Location: [facility] CATH LAB;  Service: Cardiovascular;  Laterality: N/A;  . Lower extremity angiogram N/A 05/28/2014    Procedure: LOWER  EXTREMITY ANGIOGRAM;  Surgeon: [name], MD;  Location: [facility] CATH LAB;  Service: Cardiovascular;  Laterality: N/A;  . Esophagogastroduodenoscopy    . Esophagogastroduodenoscopy N/A 01/21/2015    Procedure: ESOPHAGOGASTRODUODENOSCOPY (EGD) with possible dilation.;  Surgeon: Gatha Mayer, MD;  Location: [facility] ENDOSCOPY;  Service: Endoscopy;  Laterality: N/A;  . Balloon dilation N/A 01/21/2015    Procedure: BALLOON DILATION;  Surgeon: Gatha Mayer, MD;  Location: [facility] ENDOSCOPY;  Service: Endoscopy;  Laterality: N/A;  . Cardiac catheterization N/A 06/10/2015    Procedure: Left Heart Cath and Coronary Angiography;  Surgeon: Adrian Prows, MD;   Location: [place] CV LAB;  Service: Cardiovascular;  Laterality: N/A;   Social History   Social History  . Marital Status: Single    Spouse Name: N/A  . Number of Children: N/A  . Years of Education: N/A   Social History Main Topics  . Smoking status: Former Smoker -- 2.00 packs/day for 45 years    Types: Cigarettes    Quit date: 08/03/2003  . Smokeless tobacco: Current User    Types: Chew     Comment: quit  smoking 10 years agop  . Alcohol Use: No     Comment: quit 1980  . Drug Use: No     Comment: none in past 30 years   . Sexual Activity: Not Asked   Other Topics Concern  . None   Social History Narrative   ** Merged History Encounter **       Family History  Problem Relation Age of Onset  . Stroke Mother   . Heart disease Brother     multiple stents  . Hyperlipidemia Brother   . Emphysema Father    Allergies  Allergen Reactions  . Oxycodone Swelling and Other (See Comments)    Tongue and lip   Prior to Admission medications   Medication Sig Start Date End Date Taking? Authorizing Provider  amLODipine (NORVASC) 10 MG tablet Take 10 mg by mouth daily.    Historical Provider, MD  aspirin EC 81 MG tablet Take 1 tablet (81 mg total) by mouth daily. Patient taking differently: Take 81 mg by mouth every morning.  12/11/13   Lavon Paganini Angiulli, PA-C  atorvastatin (LIPITOR) 40 MG tablet Take 40 mg by mouth every morning.  11/12/14   Historical Provider, MD  carvedilol (COREG) 12.5 MG tablet Take 12.5 mg by mouth 2 (two) times daily with a meal.  11/13/14   Historical Provider, MD  Cyanocobalamin (B-12) 5000 MCG CAPS Take 5,000 mcg by mouth every morning.     Historical Provider, MD  ondansetron (ZOFRAN) 4 MG tablet Take 1 tab every 6 hours as needed 12/19/14   Willia Craze, NP  pantoprazole (PROTONIX) 40 MG tablet Take 40 mg by mouth every morning.  11/08/14   Historical Provider, MD  PHENYTEK 300 MG ER capsule Take 1 capsule (300 mg total) by mouth every morning.  08/13/14   [name], MD  prasugrel (EFFIENT) 10 MG TABS tablet Take 1 tablet (10 mg total) by mouth daily. Restart tomorrow 01/22/2015 01/21/15   Gatha Mayer, MD  Psyllium (METAMUCIL PO) Take 3 capsules by mouth every evening.     Historical Provider, MD  pyridOXINE (VITAMIN B-6) 100 MG tablet Take 100 mg by mouth every morning.     Historical Provider, MD  Thiamine HCl (VITAMIN B-1) 250 MG tablet Take 250 mg by mouth every morning.     Historical Provider, MD  valsartan (DIOVAN)  160 MG tablet Take 160 mg by mouth every morning.  11/08/14   Historical Provider, MD     ROS:  Constitutional: negative for fever, chills, night sweats, weight changes, or fatigue  HEENT: negative for vision changes, hearing loss, congestion, rhinorrhea, ST, epistaxis, or sinus pressure Cardiovascular: negative for chest pain or palpitations Respiratory: negative for hemoptysis, wheezing, shortness of breath, or cough Abdominal: negative for abdominal pain, nausea, vomiting, diarrhea, or constipation Dermatological: negative for rash Neurologic: negative for headache, dizziness, or syncope All other systems reviewed and are otherwise negative with the exception to those above and in the HPI.  PHYSICAL EXAM: Filed Vitals:   09/06/15 1328  BP: 140/68  Pulse: 60  Temp: 98 F (36.7 C)  Resp: 20   Body mass index is 25.02 kg/(m^2).   General: Alert, no acute distress HEENT:  Normocephalic, atraumatic, oropharynx patent. Eye: Juliette Mangle [facility] Cardiovascular:  Regular rate and rhythm, no rubs murmurs or gallops.  No Carotid bruits, radial pulse intact. No pedal edema.  Respiratory: Clear to auscultation bilaterally.  No wheezes, rales, or rhonchi.  No cyanosis, no use of accessory musculature Abdominal: No organomegaly, abdomen is soft and non-tender, positive bowel sounds. No masses. Musculoskeletal: Gait intact. No edema, tenderness Skin: No rashes. Neurologic: Facial musculature symmetric. Psychiatric:  Patient acts appropriately throughout our interaction.  Lymphatic: No cervical or submandibular lymphadenopathy Genitourinary/Anorectal: No acute findings   LABS:   EKG/XRAY:   Primary read interpreted by Dr. Everlene Farrier at [facility].   ASSESSMENT/PLAN: Dilantin level was done. Medications refilled.I personally performed the services described in this documentation, which was scribed in my presence. The recorded information has been reviewed and is accurate.   Gross sideeffects, risk and benefits, and alternatives of medications d/w patient. Patient is aware that all medications have potential sideeffects and we are unable to predict every sideeffect or drug-drug interaction that [place] occur.  [name] MD 09/06/2015 2:06 PM

## 2015-09-08 NOTE — Telephone Encounter (Signed)
Pt came in to be seen and was given RFs.

## 2015-09-10 ENCOUNTER — Encounter (HOSPITAL_BASED_OUTPATIENT_CLINIC_OR_DEPARTMENT_OTHER): Payer: Self-pay

## 2015-09-10 ENCOUNTER — Ambulatory Visit (HOSPITAL_BASED_OUTPATIENT_CLINIC_OR_DEPARTMENT_OTHER)
Admission: RE | Admit: 2015-09-10 | Discharge: 2015-09-10 | Disposition: A | Discharge status: Home / Self Care | Payer: BLUE CROSS/BLUE SHIELD | Source: Ambulatory Visit | Attending: Hematology & Oncology | Admitting: Hematology & Oncology

## 2015-09-10 ENCOUNTER — Ambulatory Visit (HOSPITAL_BASED_OUTPATIENT_CLINIC_OR_DEPARTMENT_OTHER): Payer: BLUE CROSS/BLUE SHIELD | Admitting: Hematology & Oncology

## 2015-09-10 ENCOUNTER — Other Ambulatory Visit (HOSPITAL_BASED_OUTPATIENT_CLINIC_OR_DEPARTMENT_OTHER): Payer: BLUE CROSS/BLUE SHIELD

## 2015-09-10 ENCOUNTER — Encounter: Payer: Self-pay | Admitting: Hematology & Oncology

## 2015-09-10 VITALS — BP 178/90 | HR 62 | Temp 98.0°F | Resp 18 | Ht 69.0 in | Wt 169.0 lb

## 2015-09-10 DIAGNOSIS — Z905 Acquired absence of kidney: Secondary | ICD-10-CM | POA: Insufficient documentation

## 2015-09-10 DIAGNOSIS — R590 Localized enlarged lymph nodes: Secondary | ICD-10-CM | POA: Insufficient documentation

## 2015-09-10 DIAGNOSIS — C641 Malignant neoplasm of right kidney, except renal pelvis: Secondary | ICD-10-CM

## 2015-09-10 DIAGNOSIS — M899 Disorder of bone, unspecified: Secondary | ICD-10-CM

## 2015-09-10 DIAGNOSIS — R599 Enlarged lymph nodes, unspecified: Secondary | ICD-10-CM | POA: Diagnosis not present

## 2015-09-10 DIAGNOSIS — N400 Enlarged prostate without lower urinary tract symptoms: Secondary | ICD-10-CM | POA: Insufficient documentation

## 2015-09-10 DIAGNOSIS — M898X9 Other specified disorders of bone, unspecified site: Secondary | ICD-10-CM | POA: Diagnosis not present

## 2015-09-10 LAB — CBC WITH DIFFERENTIAL (CANCER CENTER ONLY)
BASO#: 0 10*3/uL (ref 0.0–0.2)
BASO%: 0.5 % (ref 0.0–2.0)
EOS ABS: 0.8 10*3/uL — AB (ref 0.0–0.5)
EOS%: 10.7 % — ABNORMAL HIGH (ref 0.0–7.0)
HCT: 34.7 % — ABNORMAL LOW (ref 38.7–49.9)
HGB: 12 g/dL — ABNORMAL LOW (ref 13.0–17.1)
LYMPH#: 1.5 10*3/uL (ref 0.9–3.3)
LYMPH%: 19.3 % (ref 14.0–48.0)
MCH: 30.3 pg (ref 28.0–33.4)
MCHC: 34.6 g/dL (ref 32.0–35.9)
MCV: 88 fL (ref 82–98)
MONO#: 0.6 10*3/uL (ref 0.1–0.9)
MONO%: 7.8 % (ref 0.0–13.0)
NEUT#: 4.7 10*3/uL (ref 1.5–6.5)
NEUT%: 61.7 % (ref 40.0–80.0)
PLATELETS: 123 10*3/uL — AB (ref 145–400)
RBC: 3.96 10*6/uL — ABNORMAL LOW (ref 4.20–5.70)
RDW: 13.8 % (ref 11.1–15.7)
WBC: 7.7 10*3/uL (ref 4.0–10.0)

## 2015-09-10 LAB — CMP (CANCER CENTER ONLY)
ALT(SGPT): 25 U/L (ref 10–47)
AST: 31 U/L (ref 11–38)
Albumin: 3.2 g/dL — ABNORMAL LOW (ref 3.3–5.5)
Alkaline Phosphatase: 150 U/L — ABNORMAL HIGH (ref 26–84)
BUN: 21 mg/dL (ref 7–22)
CHLORIDE: 105 meq/L (ref 98–108)
CO2: 29 mEq/L (ref 18–33)
Calcium: 9.1 mg/dL (ref 8.0–10.3)
Creat: 1.5 mg/dl — ABNORMAL HIGH (ref 0.6–1.2)
GLUCOSE: 86 mg/dL (ref 73–118)
POTASSIUM: 4.6 meq/L (ref 3.3–4.7)
Sodium: 140 mEq/L (ref 128–145)
Total Bilirubin: 0.6 mg/dl (ref 0.20–1.60)
Total Protein: 6.4 g/dL (ref 6.4–8.1)

## 2015-09-10 LAB — LACTATE DEHYDROGENASE: LDH: 199 U/L (ref 125–245)

## 2015-09-10 MED ORDER — IOHEXOL 300 MG/ML  SOLN
100.0000 mL | Freq: Once | INTRAMUSCULAR | Status: AC | PRN
  Administered 2015-09-10: 100 mL via INTRAVENOUS

## 2015-09-10 NOTE — Progress Notes (Signed)
Hematology and Oncology Follow Up Visit  Gilbert Loehrer [id number] 1951/05/21 65 y.o. 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 He is doing pretty well.   We did go ahead and get a CT scan on him. The radiologist says that there is some mild enlargement of a right hilar lymph node. It now measures 1.9 cm. Everything else looks okay area and  Also noted is a sclerotic lesion involving the left scapular body. Of course, the radiologist cannot exclude bone metastases.  He has no problems with pain. He's had no shoulder issues. He's had no change in bowel or bladder habits. He's had no nausea or vomiting. He's had no cough.    He says that he recently had a "cold no"   His overall performance status is ECOG 0.  Medications:  Current outpatient prescriptions:  .  amLODipine (NORVASC) 10 MG tablet, Take 10 mg by mouth daily., Disp: , Rfl:  .  aspirin EC 81 MG tablet, Take 1 tablet (81 mg total) by mouth daily. (Patient taking differently: Take 81 mg by mouth every morning. ), Disp: , Rfl:  .  atorvastatin (LIPITOR) 40 MG tablet, Take 40 mg by mouth every morning. , Disp: , Rfl:  .  carvedilol (COREG) 12.5 MG tablet, Take 12.5 mg by mouth 2 (two) times daily with a meal. , Disp: , Rfl:  .  Cyanocobalamin (B-12) 5000 MCG CAPS, Take 5,000 mcg by mouth every morning. , Disp: , Rfl:  .  pantoprazole (PROTONIX) 40 MG tablet, Take 40 mg by mouth every morning. , Disp: , Rfl:  .  PHENYTEK 300 MG ER capsule, Take 1 capsule (300 mg total) by mouth every morning., Disp: 30 capsule, Rfl: 11 .  Psyllium (METAMUCIL PO), Take 3 capsules by mouth every evening. , Disp: , Rfl:  .  pyridOXINE (VITAMIN B-6) 100 MG tablet, Take 100 mg by mouth every morning. , Disp: , Rfl:  .  Thiamine HCl (VITAMIN B-1) 250 MG tablet, Take 250 mg by mouth every morning. , Disp: , Rfl:  .   valsartan (DIOVAN) 160 MG tablet, Take 160 mg by mouth every morning. , Disp: , Rfl:   Allergies:  Allergies  Allergen Reactions  . Oxycodone Swelling and Other (See Comments)    Tongue and lip    Past Medical History, Surgical history, Social history, and Family History were reviewed and updated.  Review of Systems: As above  Physical Exam:  height is 5\' 9"  (1.753 m) and weight is 169 lb (76.658 kg). His oral temperature is 98 F (36.7 C). His blood pressure is 178/90 and his pulse is 62. His respiration is 18.   Well-developed and well-nourished white male. Head and neck exam shows no ocular or oral lesions. He has no adenopathy in the neck. Lungs are clear. Cardiac exam regular rate and rhythm with no murmurs rubs or bruits. Abdomen is soft. He has laparoscopy scars that are well-healed from his right nephrectomy. There is no fluid wave. There is no palpable liver or spleen tip. Back exam shows no tenderness over the spine ribs or hips. Extremities shows no clubbing cyanosis or edema. Skin exam no rashes, ecchymoses or petechia. Neurological exam is nonfocal.  Lab Results  Component Value Date   WBC 7.7 09/10/2015   HGB 12.0* 09/10/2015   HCT 34.7* 09/10/2015   MCV  88 09/10/2015   PLT 123* 09/10/2015     Chemistry      Component Value Date/Time   NA 140 09/10/2015 0839   NA 140 05/04/2015 1616   K 4.6 09/10/2015 0839   K 3.8 05/04/2015 1616   CL 105 09/10/2015 0839   CL 103 05/04/2015 1616   CO2 29 09/10/2015 0839   CO2 26 05/04/2015 1616   BUN 21 09/10/2015 0839   BUN 18 05/04/2015 1616   CREATININE 1.5* 09/10/2015 0839   CREATININE 1.33 12/19/2014 1440      Component Value Date/Time   CALCIUM 9.1 09/10/2015 0839   CALCIUM 8.9 05/04/2015 1616   ALKPHOS 150* 09/10/2015 0839   ALKPHOS 165* 05/04/2015 1616   AST 31 09/10/2015 0839   AST 20 05/04/2015 1616   ALT 25 09/10/2015 0839   ALT 11 05/04/2015 1616   BILITOT 0.60 09/10/2015 0839   BILITOT 0.5 05/04/2015  1616         Impression and Plan: Gilbert [name] is 65 year old gentleman with stage III clear cell cancer of the right kidney. He underwent laparoscopic nephrectomy in March of 2015.   Unfortunately, we are stuck having to do another scan on him. I'm not sure what this right hilar lymph node enlargement is all about.  I am also not sure what this scapula issue is. I'll be surprised if it is malignant However, being that this is a history of kidney cancer, it can certainly recur in the lymph nodes and bones.  We will get a CT scan when I see him back in 3 months.   I spent about 30 minutes with he and his daughter. I reviewed the scans with them.   Volanda Napoleon, MD 2/8/201711:26 AM

## 2015-09-17 ENCOUNTER — Encounter: Payer: Self-pay | Admitting: *Deleted

## 2015-10-21 ENCOUNTER — Ambulatory Visit (HOSPITAL_COMMUNITY)
Admission: RE | Admit: 2015-10-21 | Discharge: 2015-10-21 | Disposition: A | Discharge status: Home / Self Care | Payer: Medicare Other | Source: Ambulatory Visit | Attending: Emergency Medicine | Admitting: Emergency Medicine

## 2015-10-21 ENCOUNTER — Ambulatory Visit (INDEPENDENT_AMBULATORY_CARE_PROVIDER_SITE_OTHER): Payer: BLUE CROSS/BLUE SHIELD

## 2015-10-21 ENCOUNTER — Ambulatory Visit (INDEPENDENT_AMBULATORY_CARE_PROVIDER_SITE_OTHER): Payer: BLUE CROSS/BLUE SHIELD | Admitting: Emergency Medicine

## 2015-10-21 VITALS — BP 140/62 | HR 53 | Temp 97.9°F | Resp 19 | Ht 69.0 in | Wt 166.2 lb

## 2015-10-21 DIAGNOSIS — K5900 Constipation, unspecified: Secondary | ICD-10-CM

## 2015-10-21 DIAGNOSIS — Z905 Acquired absence of kidney: Secondary | ICD-10-CM | POA: Diagnosis not present

## 2015-10-21 DIAGNOSIS — R319 Hematuria, unspecified: Secondary | ICD-10-CM

## 2015-10-21 DIAGNOSIS — C649 Malignant neoplasm of unspecified kidney, except renal pelvis: Secondary | ICD-10-CM

## 2015-10-21 DIAGNOSIS — R0781 Pleurodynia: Secondary | ICD-10-CM

## 2015-10-21 DIAGNOSIS — R1084 Generalized abdominal pain: Secondary | ICD-10-CM | POA: Diagnosis not present

## 2015-10-21 DIAGNOSIS — Z85528 Personal history of other malignant neoplasm of kidney: Secondary | ICD-10-CM | POA: Diagnosis present

## 2015-10-21 DIAGNOSIS — K858 Other acute pancreatitis without necrosis or infection: Secondary | ICD-10-CM

## 2015-10-21 LAB — COMPLETE METABOLIC PANEL WITH GFR
ALK PHOS: 159 U/L — AB (ref 40–115)
ALT: 11 U/L (ref 9–46)
AST: 15 U/L (ref 10–35)
Albumin: 3.1 g/dL — ABNORMAL LOW (ref 3.6–5.1)
BUN: 18 mg/dL (ref 7–25)
CO2: 24 mmol/L (ref 20–31)
Calcium: 8.4 mg/dL — ABNORMAL LOW (ref 8.6–10.3)
Chloride: 104 mmol/L (ref 98–110)
Creat: 1.25 mg/dL (ref 0.70–1.25)
GFR, EST NON AFRICAN AMERICAN: 60 mL/min (ref >=60)
GFR, Est African American: 70 mL/min (ref >=60)
GLUCOSE: 89 mg/dL (ref 65–99)
POTASSIUM: 4.2 mmol/L (ref 3.5–5.3)
SODIUM: 138 mmol/L (ref 135–146)
Total Bilirubin: 0.5 mg/dL (ref 0.2–1.2)
Total Protein: 6.1 g/dL (ref 6.1–8.1)

## 2015-10-21 LAB — POCT CBC
Granulocyte percent: 81.2 %G — AB (ref 37–80)
HCT, POC: 32.3 % — AB (ref 43.5–53.7)
Hemoglobin: 11.3 g/dL — AB (ref 14.1–18.1)
LYMPH, POC: 1.4 (ref 0.6–3.4)
MCH: 30.1 pg (ref 27–31.2)
MCHC: 34.9 g/dL (ref 31.8–35.4)
MCV: 86 fL (ref 80–97)
MID (CBC): 0.7 (ref 0–0.9)
MPV: 7.1 fL (ref 0–99.8)
POC Granulocyte: 8.9 — AB (ref 2–6.9)
POC LYMPH PERCENT: 12.6 %L (ref 10–50)
POC MID %: 6.2 % (ref 0–12)
Platelet Count, POC: 182 10*3/uL (ref 142–424)
RBC: 3.76 M/uL — AB (ref 4.69–6.13)
RDW, POC: 12 %
WBC: 11 10*3/uL — AB (ref 4.6–10.2)

## 2015-10-21 LAB — POCT URINALYSIS DIP (MANUAL ENTRY)
BILIRUBIN UA: NEGATIVE
GLUCOSE UA: NEGATIVE
Ketones, POC UA: NEGATIVE
LEUKOCYTES UA: NEGATIVE
NITRITE UA: NEGATIVE
Protein Ur, POC: >=300 — AB
Spec Grav, UA: 1.015
Urobilinogen, UA: 1
pH, UA: 6

## 2015-10-21 LAB — POC MICROSCOPIC URINALYSIS (UMFC): Mucus: ABSENT

## 2015-10-21 NOTE — Addendum Note (Signed)
Addended by: [name] A on: 10/21/2015 04:01 PM   Modules accepted: Orders

## 2015-10-21 NOTE — Progress Notes (Addendum)
Patient ID: Gilbert [name], male   DOB: 1951-01-22, 65 y.o.   MRN: [id number]    By signing my name below, I, [name], attest that this documentation has been prepared under the direction and in the presence of [name], MD Electronically Signed: Ladene Artist, ED Scribe 10/21/2015 at 9:49 AM.  Chief Complaint:  Chief Complaint  Patient presents with  . Follow-up    back, arms, neck pain    HPI:  Gilbert [name] is a 65 y.o. male who reports to [facility] today complaining of constant left low rib pain for several days. Pt reports increased pain with deep breaths and palpation. He denies fall or injury. He also reports associated symptoms of right upper rib pain, abdominal distension x4 days, constipation x4 days and emesis x6 days ago that has resolved. He has tried gas pills yesterday and pepto bismol for a few days with very minimal relief. States he has only had 1 BM yesterday in 4-5 days. He denies SOB.   Past Medical History  Diagnosis Date  . Gout   . GERD (gastroesophageal reflux disease)   . Hypertension   . Hypercholesteremia   . Shortness of breath     with activity  . History of epilepsy     at least 10 years ago. 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 Family history of anesthesia complication     mother-had stroke during anesthesia  . HTN (hypertension)   . Seizures ([place])   . History of myocardial infarction less than 8 weeks     Stent placed  . CAD (coronary artery disease)   . PAD (peripheral artery disease) ([place])   . Hyperlipemia   . Therapeutic drug monitoring   . Coronary artery calcification seen on CAT scan   . SOB (shortness of breath)   . History of tobacco use   . Systolic and diastolic CHF, chronic (HCC)     Resolved. EF 45% 04/10/2014  . Essential hypertension, benign   . History of nephrectomy 11/2013    For renal cell carcinoma  . Acid reflux   . Abnormal liver enzymes     Chronic alkaline phosphatase elevation since 2014  . History of cardiac arrest  11/26/13    PTCA/Stenting of LM & Prox LAD with 4.0 x 18 mm Xience alpine stent. Used Impella Circulatory assist device. EF= 20-25%  . Coronary atherosclerosis of native coronary artery   . Cancer ([place]) 08/2013    right kidney cancer  . Cancer [facility])    Past Surgical History  Procedure Laterality Date  . No past surgeries    . Eye surgery Left 2 weeks ago    torn retina  . Robot assisted laparoscopic nephrectomy Right 10/19/2013    Procedure: ROBOTIC ASSISTED LAPAROSCOPIC NEPHRECTOMY,  EXTENSIVE ADHESIOLYSIS;  Surgeon: Alexis Frock, MD;  Location: [facility] ORS;  Service: Urology;  Laterality: Right;  . Coronary angioplasty with stent placement  2015    Left main coronary artery stenting on emergent basis along with circulatory support with Impella device through left leg. With 4.0 x 18 mm Xience alpine stent  . Nephrectomy  11/2013  . Iliac artery stent Left 05/28/2014    dr Einar Gip  . Left heart catheterization with coronary angiogram N/A 11/27/2013    Procedure: LEFT HEART CATHETERIZATION WITH CORONARY ANGIOGRAM;  Surgeon: [name], MD;  Location: [facility] CATH LAB;  Service: Cardiovascular;  Laterality: N/A;  . Percutaneous coronary stent intervention (pci-s)  11/27/2013    Procedure: PERCUTANEOUS CORONARY  STENT INTERVENTION (PCI-S);  Surgeon: [name], MD;  Location: [facility] CATH LAB;  Service: Cardiovascular;;  . Lower extremity angiogram N/A 02/26/2014    Procedure: LOWER EXTREMITY ANGIOGRAM;  Surgeon: [name], MD;  Location: [facility] CATH LAB;  Service: Cardiovascular;  Laterality: N/A;  . Lower extremity angiogram N/A 05/28/2014    Procedure: LOWER EXTREMITY ANGIOGRAM;  Surgeon: [name], MD;  Location: [facility] CATH LAB;  Service: Cardiovascular;  Laterality: N/A;  . Esophagogastroduodenoscopy    . Esophagogastroduodenoscopy N/A 01/21/2015    Procedure: ESOPHAGOGASTRODUODENOSCOPY (EGD) with possible dilation.;  Surgeon: Gatha Mayer, MD;  Location: [facility] ENDOSCOPY;  Service:  Endoscopy;  Laterality: N/A;  . Balloon dilation N/A 01/21/2015    Procedure: BALLOON DILATION;  Surgeon: Gatha Mayer, MD;  Location: [facility] ENDOSCOPY;  Service: Endoscopy;  Laterality: N/A;  . Cardiac catheterization N/A 06/10/2015    Procedure: Left Heart Cath and Coronary Angiography;  Surgeon: Adrian Prows, MD;  Location: [place] CV LAB;  Service: Cardiovascular;  Laterality: N/A;   Social History   Social History  . Marital Status: Single    Spouse Name: N/A  . Number of Children: N/A  . Years of Education: N/A   Social History Main Topics  . Smoking status: Former Smoker -- 2.00 packs/day for 45 years    Types: Cigarettes    Quit date: 08/03/2003  . Smokeless tobacco: Current User    Types: Chew     Comment: quit  smoking 10 years agop  . Alcohol Use: No     Comment: quit 1980  . Drug Use: No     Comment: none in past 30 years   . Sexual Activity: Not Asked   Other Topics Concern  . None   Social History Narrative   ** Merged History Encounter **       Family History  Problem Relation Age of Onset  . Stroke Mother   . Heart disease Brother     multiple stents  . Hyperlipidemia Brother   . Emphysema Father    Allergies  Allergen Reactions  . Oxycodone Swelling and Other (See Comments)    Tongue and lip   Prior to Admission medications   Medication Sig Start Date End Date Taking? Authorizing Provider  amLODipine (NORVASC) 10 MG tablet Take 10 mg by mouth daily.   Yes Historical Provider, MD  aspirin EC 81 MG tablet Take 1 tablet (81 mg total) by mouth daily. Patient taking differently: Take 81 mg by mouth every morning.  12/11/13  Yes Daniel J Angiulli, PA-C  atorvastatin (LIPITOR) 40 MG tablet Take 40 mg by mouth every morning.  11/12/14  Yes Historical Provider, MD  carvedilol (COREG) 12.5 MG tablet Take 12.5 mg by mouth 2 (two) times daily with a meal.  11/13/14  Yes Historical Provider, MD  Cyanocobalamin (B-12) 5000 MCG CAPS Take 5,000 mcg by mouth every  morning.    Yes Historical Provider, MD  pantoprazole (PROTONIX) 40 MG tablet Take 40 mg by mouth every morning.  11/08/14  Yes Historical Provider, MD  PHENYTEK 300 MG ER capsule Take 1 capsule (300 mg total) by mouth every morning. 09/06/15  Yes [name], MD  Psyllium (METAMUCIL PO) Take 3 capsules by mouth every evening.    Yes Historical Provider, MD  pyridOXINE (VITAMIN B-6) 100 MG tablet Take 100 mg by mouth every morning.    Yes Historical Provider, MD  Thiamine HCl (VITAMIN B-1) 250 MG tablet Take 250 mg by  mouth every morning.    Yes Historical Provider, MD  valsartan (DIOVAN) 160 MG tablet Take 160 mg by mouth every morning.  11/08/14  Yes Historical Provider, MD   ROS: The patient denies fevers, chills, night sweats, unintentional weight loss, chest pain, palpitations, wheezing, -SOB, nausea, vomiting, dysuria, hematuria, melena, numbness, weakness, or tingling. +rib pain, +abdominal pain, +abdominal distension, +vomiting (resolved), +constipation   All other systems have been reviewed and were otherwise negative with the exception of those mentioned in the HPI and as above.    PHYSICAL EXAM: Filed Vitals:   10/21/15 0840  BP: 140/62  Pulse: 53  Temp: 97.9 F (36.6 C)  Resp: 19   Body mass index is 24.53 kg/(m^2).  General: Alert, no acute distress. Appears to be in pain but not toxic.  HEENT:  Normocephalic, atraumatic, oropharynx patent. Eye: Juliette Mangle [facility] Cardiovascular: Regular rate and rhythm, no rubs murmurs or gallops. No Carotid bruits, radial pulse intact. No pedal edema.  Respiratory: Clear to auscultation bilaterally. No wheezes, rales, or rhonchi. No cyanosis, no use of accessory musculature. Tenderness over the R upper lateral ribs and the L lower posterior lateral ribs.  Abdominal: No organomegaly, abdomen is soft and flat. No masses. Bruit in L lower abdomen. Tender in mid epigastrium and L side of abdomen.  Musculoskeletal: Gait intact. No edema,  tenderness Skin: No rashes. Neurologic: Facial musculature symmetric. Psychiatric: Patient acts appropriately throughout our interaction. Lymphatic: No cervical or submandibular lymphadenopathy  LABS: Results for orders placed or performed in visit on 10/21/15  POCT CBC  Result Value Ref Range   WBC 11.0 (A) 4.6 - 10.2 K/uL   Lymph, poc 1.4 0.6 - 3.4   POC LYMPH PERCENT 12.6 10 - 50 %L   MID (cbc) 0.7 0 - 0.9   POC MID % 6.2 0 - 12 %M   POC Granulocyte 8.9 (A) 2 - 6.9   Granulocyte percent 81.2 (A) 37 - 80 %G   RBC 3.76 (A) 4.69 - 6.13 M/uL   Hemoglobin 11.3 (A) 14.1 - 18.1 g/dL   HCT, POC 32.3 (A) 43.5 - 53.7 %   MCV 86.0 80 - 97 fL   MCH, POC 30.1 27 - 31.2 pg   MCHC 34.9 31.8 - 35.4 g/dL   RDW, POC 12.0 %   Platelet Count, POC 182 142 - 424 K/uL   MPV 7.1 0 - 99.8 fL  POCT urinalysis dipstick  Result Value Ref Range   Color, UA yellow yellow   Clarity, UA clear clear   Glucose, UA negative negative   Bilirubin, UA negative negative   Ketones, POC UA negative negative   Spec Grav, UA 1.015    Blood, UA moderate (A) negative   pH, UA 6.0    Protein Ur, POC >=300 (A) negative   Urobilinogen, UA 1.0    Nitrite, UA Negative Negative   Leukocytes, UA Negative Negative  POCT Microscopic Urinalysis ([facility])  Result Value Ref Range   WBC,UR,HPF,POC Few (A) None WBC/hpf   RBC,UR,HPF,POC Many (A) None RBC/hpf   Bacteria Few (A) None, Too numerous to count   Mucus Absent Absent   Epithelial Cells, UR Per Microscopy Few (A) None, Too numerous to count cells/hpf     EKG/XRAY:   Primary read interpreted by Dr. Everlene Farrier at [facility]. Dg Ribs Unilateral Left  10/21/2015  CLINICAL DATA:  Left lower rib pain for several days, no acute injury EXAM: LEFT RIBS - 2 VIEW COMPARISON:  CT chest of 09/10/2015  FINDINGS: No definite acute fracture is seen. However on 1 oblique view, there is questionable loss of the cortical margin of the left anterior lateral eighth rib. I did review the CT of the  chest from 09/10/2015 showing no bony abnormality at that time. Therefore this finding is of questionable significance. If pain persists repeat left rib films would be recommended in 1-2 weeks. IMPRESSION: 1. No definite left rib fracture. 2. However, slight indistinctness of the cortex of the left anterolateral eighth rib is noted and if pain persists followup films of the left ribs are recommended in 1-2 weeks. Electronically Signed   By: Ivar Drape M.D.   On: 10/21/2015 09:33   Dg Abd Acute W/chest  10/21/2015  CLINICAL DATA:  Abdominal distention for 4 days.  Chest pain. EXAM: DG ABDOMEN ACUTE W/ 1V CHEST COMPARISON:  CT chest, abdomen, and pelvis September 10, 2015; chest radiograph Scharrer 12, 2016 FINDINGS: PA chest: There is no edema or consolidation. Heart size and pulmonary vascularity are normal. Reactive right hilar adenopathy seen on recent CT is not appreciable on radiographic examination. Supine and upright abdomen: There is moderate stool throughout the colon. There is no bowel dilatation or air-fluid level suggesting obstruction. No free air. There is a stent involving the left common and external iliac arteries extending to the proximal common femoral artery on the left. IMPRESSION: Moderate stool in colon. No bowel obstruction or free air evident. No lung edema or consolidation. Electronically Signed   By: Lowella Grip III M.D.   On: 10/21/2015 09:31   ASSESSMENT/PLAN:  Patient placed on MiraLAX for his constipation. Bone scan ordered because of the questionable cortical lesions seen on chest x-ray patient also had hematuria. He has a history of renal cancer. We'll go ahead and CT is abdomen. His white count was slightly elevated at 11,000..I personally performed the services described in this documentation, which was scribed in my presence. The recorded information has been reviewed and is accurate. CT scanning was concerning for pancreatitis. Will add an amylase and lipase.   Gross  sideeffects, risk and benefits, and alternatives of medications d/w patient. Patient is aware that all medications have potential sideeffects and we are unable to predict every sideeffect or drug-drug interaction that Montoya occur.  [name] MD 10/21/2015 8:47 AM

## 2015-10-21 NOTE — Patient Instructions (Addendum)
Go to [facility] for outpatient Ct go to Radiology @ 2:00 pm   IF you received an x-ray today, you will receive an invoice from [facility] Radiology. Please contact [facility] Radiology at [phone] with questions or concerns regarding your invoice.   IF you received labwork today, you will receive an invoice from [employer]. Please contact [facility] at [phone] with questions or concerns regarding your invoice.   Our billing staff will not be able to assist you with questions regarding bills from these companies.  You will be contacted with the lab results as soon as they are available. The fastest way to get your results is to activate your My Chart account. Instructions are located on the last page of this paperwork. If you have not heard from [place] regarding the results in 2 weeks, please contact this office.    I have scheduled you for a bone scan to evaluate your right upper ribs and left lower ribs.Hematuria, Adult Hematuria is blood in your urine. It can be caused by a bladder infection, kidney infection, prostate infection, kidney stone, or cancer of your urinary tract. Infections can usually be treated with medicine, and a kidney stone usually will pass through your urine. If neither of these is the cause of your hematuria, further workup to find out the reason Geralds be needed. It is very important that you tell your health care provider about any blood you see in your urine, even if the blood stops without treatment or happens without causing pain. Blood in your urine that happens and then stops and then happens again can be a symptom of a very serious condition. Also, pain is not a symptom in the initial stages of many urinary cancers. HOME CARE INSTRUCTIONS   Drink lots of fluid, 3-4 quarts a day. If you have been diagnosed with an infection, cranberry juice is especially recommended, in addition to large amounts of water.  Avoid caffeine, tea,  and carbonated beverages because they tend to irritate the bladder.  Avoid alcohol because it Meller irritate the prostate.  Take all medicines as directed by your health care provider.  If you were prescribed an antibiotic medicine, finish it all even if you start to feel better.  If you have been diagnosed with a kidney stone, follow your health care provider's instructions regarding straining your urine to catch the stone.  Empty your bladder often. Avoid holding urine for long periods of time.  After a bowel movement, women should cleanse front to back. Use each tissue only once.  Empty your bladder before and after sexual intercourse if you are a male. SEEK MEDICAL CARE IF:  You develop back pain.  You have a fever.  You have a feeling of sickness in your stomach (nausea) or vomiting.  Your symptoms are not better in 3 days. Return sooner if you are getting worse. SEEK IMMEDIATE MEDICAL CARE IF:   You develop severe vomiting and are unable to keep the medicine down.  You develop severe back or abdominal pain despite taking your medicines.  You begin passing a large amount of blood or clots in your urine.  You feel extremely weak or faint, or you pass out. MAKE SURE YOU:   Understand these instructions.  Will watch your condition.  Will get help right away if you are not doing well or get worse.   This information is not intended to replace advice given to you by your health care provider. Make sure you  discuss any questions you have with your health care provider.   Document Released: 07/19/2005 Document Revised: 08/09/2014 Document Reviewed: 03/19/2013 Elsevier Interactive Patient Education 2016 [employer].  Constipation, Adult Constipation is when a person has fewer than three bowel movements a week, has difficulty having a bowel movement, or has stools that are dry, hard, or larger than normal. As people grow older, constipation is more common. A low-fiber  diet, not taking in enough fluids, and taking certain medicines Furniss make constipation worse.  CAUSES   Certain medicines, such as antidepressants, pain medicine, iron supplements, antacids, and water pills.   Certain diseases, such as diabetes, irritable bowel syndrome (IBS), thyroid disease, or depression.   Not drinking enough water.   Not eating enough fiber-rich foods.   Stress or travel.   Lack of physical activity or exercise.   Ignoring the urge to have a bowel movement.   Using laxatives too much.  SIGNS AND SYMPTOMS   Having fewer than three bowel movements a week.   Straining to have a bowel movement.   Having stools that are hard, dry, or larger than normal.   Feeling full or bloated.   Pain in the lower abdomen.   Not feeling relief after having a bowel movement.  DIAGNOSIS  Your health care provider will take a medical history and perform a physical exam. Further testing Naeem be done for severe constipation. Some tests Tagle include:  A barium enema X-ray to examine your rectum, colon, and, sometimes, your small intestine.   A sigmoidoscopy to examine your lower colon.   A colonoscopy to examine your entire colon. TREATMENT  Treatment will depend on the severity of your constipation and what is causing it. Some dietary treatments include drinking more fluids and eating more fiber-rich foods. Lifestyle treatments Housh include regular exercise. If these diet and lifestyle recommendations do not help, your health care provider Moroni recommend taking over-the-counter laxative medicines to help you have bowel movements. Prescription medicines Nuckles be prescribed if over-the-counter medicines do not work.  HOME CARE INSTRUCTIONS   Eat foods that have a lot of fiber, such as fruits, vegetables, whole grains, and beans.  Limit foods high in fat and processed sugars, such as french fries, hamburgers, cookies, candies, and soda.   A fiber supplement Ezzell be  added to your diet if you cannot get enough fiber from foods.   Drink enough fluids to keep your urine clear or pale yellow.   Exercise regularly or as directed by your health care provider.   Go to the restroom when you have the urge to go. Do not hold it.   Only take over-the-counter or prescription medicines as directed by your health care provider. Do not take other medicines for constipation without talking to your health care provider first.  [place] IF:   You have bright red blood in your stool.   Your constipation lasts for more than 4 days or gets worse.   You have abdominal or rectal pain.   You have thin, pencil-like stools.   You have unexplained weight loss. MAKE SURE YOU:   Understand these instructions.  Will watch your condition.  Will get help right away if you are not doing well or get worse.   This information is not intended to replace advice given to you by your health care provider. Make sure you discuss any questions you have with your health care provider.   Document Released: 04/16/2004 Document Revised: 08/09/2014 Document  Reviewed: 04/30/2013 Elsevier Interactive Patient Education 2016 [employer]. Constipation, Adult Constipation is when a person has fewer than three bowel movements a week, has difficulty having a bowel movement, or has stools that are dry, hard, or larger than normal. As people grow older, constipation is more common. A low-fiber diet, not taking in enough fluids, and taking certain medicines Conigliaro make constipation worse.  CAUSES   Certain medicines, such as antidepressants, pain medicine, iron supplements, antacids, and water pills.   Certain diseases, such as diabetes, irritable bowel syndrome (IBS), thyroid disease, or depression.   Not drinking enough water.   Not eating enough fiber-rich foods.   Stress or travel.   Lack of physical activity or exercise.   Ignoring the urge to have a  bowel movement.   Using laxatives too much.  SIGNS AND SYMPTOMS   Having fewer than three bowel movements a week.   Straining to have a bowel movement.   Having stools that are hard, dry, or larger than normal.   Feeling full or bloated.   Pain in the lower abdomen.   Not feeling relief after having a bowel movement.  DIAGNOSIS  Your health care provider will take a medical history and perform a physical exam. Further testing Steinruck be done for severe constipation. Some tests Stai include:  A barium enema X-ray to examine your rectum, colon, and, sometimes, your small intestine.   A sigmoidoscopy to examine your lower colon.   A colonoscopy to examine your entire colon. TREATMENT  Treatment will depend on the severity of your constipation and what is causing it. Some dietary treatments include drinking more fluids and eating more fiber-rich foods. Lifestyle treatments Quesnel include regular exercise. If these diet and lifestyle recommendations do not help, your health care provider Koffman recommend taking over-the-counter laxative medicines to help you have bowel movements. Prescription medicines Talton be prescribed if over-the-counter medicines do not work.  HOME CARE INSTRUCTIONS   Eat foods that have a lot of fiber, such as fruits, vegetables, whole grains, and beans.  Limit foods high in fat and processed sugars, such as french fries, hamburgers, cookies, candies, and soda.   A fiber supplement Kaczmarek be added to your diet if you cannot get enough fiber from foods.   Drink enough fluids to keep your urine clear or pale yellow.   Exercise regularly or as directed by your health care provider.   Go to the restroom when you have the urge to go. Do not hold it.   Only take over-the-counter or prescription medicines as directed by your health care provider. Do not take other medicines for constipation without talking to your health care provider first.  [place] IF:   You have bright red blood in your stool.   Your constipation lasts for more than 4 days or gets worse.   You have abdominal or rectal pain.   You have thin, pencil-like stools.   You have unexplained weight loss. MAKE SURE YOU:   Understand these instructions.  Will watch your condition.  Will get help right away if you are not doing well or get worse.   This information is not intended to replace advice given to you by your health care provider. Make sure you discuss any questions you have with your health care provider.   Document Released: 04/16/2004 Document Revised: 08/09/2014 Document Reviewed: 04/30/2013 Elsevier Interactive Patient Education [employer].

## 2015-10-22 ENCOUNTER — Telehealth: Payer: Self-pay | Admitting: *Deleted

## 2015-10-22 LAB — URINE CULTURE
Colony Count: NO GROWTH
ORGANISM ID, BACTERIA: NO GROWTH

## 2015-10-22 NOTE — Telephone Encounter (Signed)
Called pt to advised him of CT results and he states that he is in a lot of pain.  He would like something for pain.  He would like a call from you if that is possible.

## 2015-10-23 ENCOUNTER — Telehealth: Payer: Self-pay | Admitting: Emergency Medicine

## 2015-10-23 ENCOUNTER — Other Ambulatory Visit: Payer: Self-pay | Admitting: Emergency Medicine

## 2015-10-23 ENCOUNTER — Telehealth: Payer: Self-pay | Admitting: Family Medicine

## 2015-10-23 ENCOUNTER — Emergency Department (HOSPITAL_COMMUNITY)
Admission: EM | Admit: 2015-10-23 | Discharge: 2015-10-23 | Disposition: A | Discharge status: Home / Self Care | Payer: BLUE CROSS/BLUE SHIELD | Attending: Emergency Medicine | Admitting: Emergency Medicine

## 2015-10-23 ENCOUNTER — Encounter (HOSPITAL_COMMUNITY): Payer: Self-pay | Admitting: *Deleted

## 2015-10-23 DIAGNOSIS — I5042 Chronic combined systolic (congestive) and diastolic (congestive) heart failure: Secondary | ICD-10-CM | POA: Insufficient documentation

## 2015-10-23 DIAGNOSIS — R109 Unspecified abdominal pain: Secondary | ICD-10-CM | POA: Diagnosis present

## 2015-10-23 DIAGNOSIS — Z7982 Long term (current) use of aspirin: Secondary | ICD-10-CM | POA: Insufficient documentation

## 2015-10-23 DIAGNOSIS — Z85528 Personal history of other malignant neoplasm of kidney: Secondary | ICD-10-CM | POA: Diagnosis not present

## 2015-10-23 DIAGNOSIS — E785 Hyperlipidemia, unspecified: Secondary | ICD-10-CM | POA: Diagnosis not present

## 2015-10-23 DIAGNOSIS — Z79899 Other long term (current) drug therapy: Secondary | ICD-10-CM | POA: Insufficient documentation

## 2015-10-23 DIAGNOSIS — I2510 Atherosclerotic heart disease of native coronary artery without angina pectoris: Secondary | ICD-10-CM | POA: Insufficient documentation

## 2015-10-23 DIAGNOSIS — K219 Gastro-esophageal reflux disease without esophagitis: Secondary | ICD-10-CM | POA: Diagnosis not present

## 2015-10-23 DIAGNOSIS — E782 Mixed hyperlipidemia: Secondary | ICD-10-CM | POA: Insufficient documentation

## 2015-10-23 DIAGNOSIS — Z87891 Personal history of nicotine dependence: Secondary | ICD-10-CM | POA: Diagnosis not present

## 2015-10-23 DIAGNOSIS — K8500 Idiopathic acute pancreatitis without necrosis or infection: Secondary | ICD-10-CM

## 2015-10-23 DIAGNOSIS — I252 Old myocardial infarction: Secondary | ICD-10-CM | POA: Diagnosis not present

## 2015-10-23 DIAGNOSIS — R809 Proteinuria, unspecified: Secondary | ICD-10-CM

## 2015-10-23 DIAGNOSIS — R319 Hematuria, unspecified: Secondary | ICD-10-CM

## 2015-10-23 DIAGNOSIS — I10 Essential (primary) hypertension: Secondary | ICD-10-CM | POA: Insufficient documentation

## 2015-10-23 LAB — URINALYSIS, ROUTINE W REFLEX MICROSCOPIC
Bilirubin Urine: NEGATIVE
GLUCOSE, UA: NEGATIVE mg/dL
KETONES UR: NEGATIVE mg/dL
LEUKOCYTES UA: NEGATIVE
NITRITE: NEGATIVE
Specific Gravity, Urine: 1.01 (ref 1.005–1.030)
pH: 6 (ref 5.0–8.0)

## 2015-10-23 LAB — COMPREHENSIVE METABOLIC PANEL
ALT: 12 U/L — AB (ref 17–63)
AST: 17 U/L (ref 15–41)
Albumin: 2.3 g/dL — ABNORMAL LOW (ref 3.5–5.0)
Alkaline Phosphatase: 136 U/L — ABNORMAL HIGH (ref 38–126)
Anion gap: 8 (ref 5–15)
BILIRUBIN TOTAL: 0.8 mg/dL (ref 0.3–1.2)
BUN: 15 mg/dL (ref 6–20)
CALCIUM: 8.8 mg/dL — AB (ref 8.9–10.3)
CO2: 25 mmol/L (ref 22–32)
CREATININE: 1.35 mg/dL — AB (ref 0.61–1.24)
Chloride: 101 mmol/L (ref 101–111)
GFR, EST NON AFRICAN AMERICAN: 54 mL/min — AB (ref >60)
Glucose, Bld: 101 mg/dL — ABNORMAL HIGH (ref 65–99)
Potassium: 4.7 mmol/L (ref 3.5–5.1)
Sodium: 134 mmol/L — ABNORMAL LOW (ref 135–145)
TOTAL PROTEIN: 6 g/dL — AB (ref 6.5–8.1)

## 2015-10-23 LAB — CBC
HCT: 30.8 % — ABNORMAL LOW (ref 39.0–52.0)
Hemoglobin: 10.5 g/dL — ABNORMAL LOW (ref 13.0–17.0)
MCH: 28.8 pg (ref 26.0–34.0)
MCHC: 34.1 g/dL (ref 30.0–36.0)
MCV: 84.4 fL (ref 78.0–100.0)
PLATELETS: 177 10*3/uL (ref 150–400)
RBC: 3.65 MIL/uL — AB (ref 4.22–5.81)
RDW: 12.5 % (ref 11.5–15.5)
WBC: 9.5 10*3/uL (ref 4.0–10.5)

## 2015-10-23 LAB — URINE MICROSCOPIC-ADD ON

## 2015-10-23 LAB — LIPASE, BLOOD: Lipase: 29 U/L (ref 11–51)

## 2015-10-23 MED ORDER — ONDANSETRON HCL 4 MG/2ML IJ SOLN
4.0000 mg | Freq: Once | INTRAMUSCULAR | Status: AC
Start: 2015-10-23 — End: 2015-10-23
  Administered 2015-10-23: 4 mg via INTRAVENOUS
  Filled 2015-10-23: qty 2

## 2015-10-23 MED ORDER — MORPHINE SULFATE (PF) 4 MG/ML IV SOLN
4.0000 mg | Freq: Once | INTRAVENOUS | Status: AC
  Administered 2015-10-23: 4 mg via INTRAVENOUS
  Filled 2015-10-23: qty 1

## 2015-10-23 MED ORDER — HYDROCODONE-ACETAMINOPHEN 5-325 MG PO TABS: 1.0000 | ORAL_TABLET | Freq: Four times a day (QID) | ORAL | Status: DC | PRN

## 2015-10-23 MED ORDER — SODIUM CHLORIDE 0.9 % IV BOLUS (SEPSIS)
1000.0000 mL | Freq: Once | INTRAVENOUS | Status: AC
  Administered 2015-10-23: 1000 mL via INTRAVENOUS

## 2015-10-23 MED ORDER — CYCLOBENZAPRINE HCL 10 MG PO TABS: 10.0000 mg | ORAL_TABLET | Freq: Three times a day (TID) | ORAL | Status: DC | PRN

## 2015-10-23 NOTE — Telephone Encounter (Signed)
Spoke with patients daughter and she states that patient does not want to go to the hospital at this time.

## 2015-10-23 NOTE — Telephone Encounter (Signed)
I called and spoke with the patient's daughter. I advised her to take Mr. made to the emergency room for treatment of the pancreatitis seen on CT and also for reevaluation of his abdominal pain.

## 2015-10-23 NOTE — Telephone Encounter (Signed)
Patient has been advised to go to the ER at [facility] for evaluation and treatment.

## 2015-10-23 NOTE — ED Notes (Signed)
Per family- pt was seen by dr. Josepha Pigg. Pt was sent here for IV fluids and pain medications for abdominal pain. Pt has left flank pain. Pt was diagnosed with pancreatitis and constipation recently.

## 2015-10-23 NOTE — Discharge Instructions (Signed)
Hydrocodone as prescribed as needed for pain.  Return to the emergency department if you develop worsening pain, bloody stools, high fevers, or other new and concerning symptoms.   Flank Pain Flank pain refers to pain that is located on the side of the body between the upper abdomen and the back. The pain [place] occur over a short period of time (acute) or [place] be long-term or reoccurring (chronic). It [place] be mild or severe. Flank pain can be caused by many things. CAUSES  Some of the more common causes of flank pain include:  Muscle strains.   Muscle spasms.   A disease of your spine (vertebral disk disease).   A lung infection (pneumonia).   Fluid around your lungs (pulmonary edema).   A kidney infection.   Kidney stones.   A very painful skin rash caused by the chickenpox virus (shingles).   Gallbladder disease.  [place] care will depend on the cause of your pain. In general,  Rest as directed by your caregiver.  Drink enough fluids to keep your urine clear or pale yellow.  Only take over-the-counter or prescription medicines as directed by your caregiver. Some medicines [place] help relieve the pain.  Tell your caregiver about any changes in your pain.  Follow up with your caregiver as directed. SEEK IMMEDIATE MEDICAL CARE IF:   Your pain is not controlled with medicine.   You have new or worsening symptoms.  Your pain increases.   You have abdominal pain.   You have shortness of breath.   You have persistent nausea or vomiting.   You have swelling in your abdomen.   You feel faint or pass out.   You have blood in your urine.  You have a fever or persistent symptoms for more than 2-3 days.  You have a fever and your symptoms suddenly get worse. MAKE SURE YOU:   Understand these instructions.  Will watch your condition.  Will get help right away if you are not doing well or get worse.   This information is not  intended to replace advice given to you by your health care provider. Make sure you discuss any questions you have with your health care provider.   Document Released: 09/09/2005 Document Revised: 04/12/2012 Document Reviewed: 03/02/2012 Elsevier Interactive Patient Education [employer].

## 2015-10-23 NOTE — Telephone Encounter (Signed)
Call patient let him know he is spilling large amounts of protein in his urine and needs to follow-up with me tomorrow. I have also made a referral to nephrology for their help.

## 2015-10-23 NOTE — Telephone Encounter (Signed)
Call patient and be sure he understands he has pancreatitis. Ask him what pain medication he can take. I will call Dr. Carlean Purl today and see when we can get him there for an evaluation.

## 2015-10-23 NOTE — ED Provider Notes (Signed)
CSN: [id number]     Arrival date & time 10/23/15  1236 History   First MD Initiated Contact with Patient 10/23/15 1301     Chief Complaint  Patient presents with  . Abdominal Pain     (Consider location/radiation/quality/duration/timing/severity/associated sxs/prior Treatment) HPI Comments: Patient is a 65 year old male with history of coronary artery disease with stent, hypertension, and high cholesterol. He presents for evaluation of left-sided flank pain that started several days ago. He was seen by his primary Dr. and had a CT scan and laboratory studies obtained. The CT showed pancreatitis within the tail of the pancreas. The results of the blood work is unknown. He was seen by his primary Dr. today and advised to come here for admission, IV fluids, and pain control. Patient denies any history of alcohol consumption. He denies any fevers or chills. He does report some recent constipation, however his bowels have since improved.  Patient is a 65 y.o. male presenting with abdominal pain. The history is provided by the patient.  Abdominal Pain Pain location:  L flank Pain quality: stabbing   Pain radiates to:  Does not radiate Pain severity:  Moderate Onset quality:  Sudden Duration:  2 days Timing:  Constant Progression:  Worsening Chronicity:  New Relieved by:  Nothing Worsened by:  Nothing tried Ineffective treatments:  None tried   Past Medical History  Diagnosis Date  . Gout   . GERD (gastroesophageal reflux disease)   . Hypertension   . Hypercholesteremia   . Shortness of breath     with activity  . History of epilepsy     at least 10 years ago. 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 Family history of anesthesia complication     mother-had stroke during anesthesia  . HTN (hypertension)   . Seizures ([place])   . History of myocardial infarction less than 8 weeks     Stent placed  . CAD (coronary artery disease)   . PAD (peripheral artery disease) ([place])   . Hyperlipemia    . Therapeutic drug monitoring   . Coronary artery calcification seen on CAT scan   . SOB (shortness of breath)   . History of tobacco use   . Systolic and diastolic CHF, chronic (HCC)     Resolved. EF 45% 04/10/2014  . Essential hypertension, benign   . History of nephrectomy 11/2013    For renal cell carcinoma  . Acid reflux   . Abnormal liver enzymes     Chronic alkaline phosphatase elevation since 2014  . History of cardiac arrest 11/26/13    PTCA/Stenting of LM & Prox LAD with 4.0 x 18 mm Xience alpine stent. Used Impella Circulatory assist device. EF= 20-25%  . Coronary atherosclerosis of native coronary artery   . Cancer ([place]) 08/2013    right kidney cancer  . Cancer [facility])    Past Surgical History  Procedure Laterality Date  . No past surgeries    . Eye surgery Left 2 weeks ago    torn retina  . Robot assisted laparoscopic nephrectomy Right 10/19/2013    Procedure: ROBOTIC ASSISTED LAPAROSCOPIC NEPHRECTOMY,  EXTENSIVE ADHESIOLYSIS;  Surgeon: Alexis Frock, MD;  Location: [facility] ORS;  Service: Urology;  Laterality: Right;  . Coronary angioplasty with stent placement  2015    Left main coronary artery stenting on emergent basis along with circulatory support with Impella device through left leg. With 4.0 x 18 mm Xience alpine stent  . Nephrectomy  11/2013  . Iliac artery stent Left  05/28/2014    dr Einar Gip  . Left heart catheterization with coronary angiogram N/A 11/27/2013    Procedure: LEFT HEART CATHETERIZATION WITH CORONARY ANGIOGRAM;  Surgeon: [name], MD;  Location: [facility] CATH LAB;  Service: Cardiovascular;  Laterality: N/A;  . Percutaneous coronary stent intervention (pci-s)  11/27/2013    Procedure: PERCUTANEOUS CORONARY STENT INTERVENTION (PCI-S);  Surgeon: [name], MD;  Location: [facility] CATH LAB;  Service: Cardiovascular;;  . Lower extremity angiogram N/A 02/26/2014    Procedure: LOWER EXTREMITY ANGIOGRAM;  Surgeon: [name], MD;  Location: [facility] CATH LAB;   Service: Cardiovascular;  Laterality: N/A;  . Lower extremity angiogram N/A 05/28/2014    Procedure: LOWER EXTREMITY ANGIOGRAM;  Surgeon: [name], MD;  Location: [facility] CATH LAB;  Service: Cardiovascular;  Laterality: N/A;  . Esophagogastroduodenoscopy    . Esophagogastroduodenoscopy N/A 01/21/2015    Procedure: ESOPHAGOGASTRODUODENOSCOPY (EGD) with possible dilation.;  Surgeon: Gatha Mayer, MD;  Location: [facility] ENDOSCOPY;  Service: Endoscopy;  Laterality: N/A;  . Balloon dilation N/A 01/21/2015    Procedure: BALLOON DILATION;  Surgeon: Gatha Mayer, MD;  Location: [facility] ENDOSCOPY;  Service: Endoscopy;  Laterality: N/A;  . Cardiac catheterization N/A 06/10/2015    Procedure: Left Heart Cath and Coronary Angiography;  Surgeon: Adrian Prows, MD;  Location: [facility] CV LAB;  Service: Cardiovascular;  Laterality: N/A;   Family History  Problem Relation Age of Onset  . Stroke Mother   . Heart disease Brother     multiple stents  . Hyperlipidemia Brother   . Emphysema Father    Social History  Substance Use Topics  . Smoking status: Former Smoker -- 2.00 packs/day for 45 years    Types: Cigarettes    Quit date: 08/03/2003  . Smokeless tobacco: Current User    Types: Chew     Comment: quit  smoking 10 years agop  . Alcohol Use: No     Comment: quit 1980    Review of Systems  Gastrointestinal: Positive for abdominal pain.  All other systems reviewed and are negative.     Allergies  Oxycodone  Home Medications   Prior to Admission medications   Medication Sig Start Date End Date Taking? Authorizing Provider  amLODipine (NORVASC) 10 MG tablet Take 10 mg by mouth daily.    Historical Provider, MD  aspirin EC 81 MG tablet Take 1 tablet (81 mg total) by mouth daily. Patient taking differently: Take 81 mg by mouth every morning.  12/11/13   Lavon Paganini Angiulli, PA-C  atorvastatin (LIPITOR) 40 MG tablet Take 40 mg by mouth every morning.  11/12/14   Historical Provider, MD  carvedilol  (COREG) 12.5 MG tablet Take 12.5 mg by mouth 2 (two) times daily with a meal.  11/13/14   Historical Provider, MD  Cyanocobalamin (B-12) 5000 MCG CAPS Take 5,000 mcg by mouth every morning.     Historical Provider, MD  pantoprazole (PROTONIX) 40 MG tablet Take 40 mg by mouth every morning.  11/08/14   Historical Provider, MD  PHENYTEK 300 MG ER capsule Take 1 capsule (300 mg total) by mouth every morning. 09/06/15   [name], MD  Psyllium (METAMUCIL PO) Take 3 capsules by mouth every evening.     Historical Provider, MD  pyridOXINE (VITAMIN B-6) 100 MG tablet Take 100 mg by mouth every morning.     Historical Provider, MD  Thiamine HCl (VITAMIN B-1) 250 MG tablet Take 250 mg by mouth every morning.  Historical Provider, MD  valsartan (DIOVAN) 160 MG tablet Take 160 mg by mouth every morning.  11/08/14   Historical Provider, MD   BP 127/78 mmHg  Pulse 57  Temp(Src) 97.7 F (36.5 C) (Oral)  Resp 16  Wt 168 lb (76.204 kg)  SpO2 100% Physical Exam  Constitutional: He is oriented to person, place, and time. He appears well-developed and well-nourished. No distress.  HENT:  Head: Normocephalic and atraumatic.  Mouth/Throat: Oropharynx is clear and moist.  Neck: Normal range of motion. Neck supple.  Cardiovascular: Normal rate, regular rhythm and normal heart sounds.   No murmur heard. Pulmonary/Chest: Effort normal and breath sounds normal. No respiratory distress. He has no wheezes. He has no rales.  Abdominal: Soft. Bowel sounds are normal. He exhibits no distension. There is tenderness. There is no rebound and no guarding.  There is tenderness to palpation in the left flank. The abdomen is soft, nontender  Musculoskeletal: Normal range of motion. He exhibits no edema.  Neurological: He is alert and oriented to person, place, and time.  Skin: Skin is warm and dry. He is not diaphoretic.  Nursing note and vitals reviewed.   ED Course  Procedures (including critical care time) Labs  Review Labs Reviewed  COMPREHENSIVE METABOLIC PANEL - Abnormal; Notable for the following:    Sodium 134 (*)    Glucose, Bld 101 (*)    Creatinine, Ser 1.35 (*)    Calcium 8.8 (*)    Total Protein 6.0 (*)    Albumin 2.3 (*)    ALT 12 (*)    Alkaline Phosphatase 136 (*)    GFR calc non Af Amer 54 (*)    All other components within normal limits  CBC - Abnormal; Notable for the following:    RBC 3.65 (*)    Hemoglobin 10.5 (*)    HCT 30.8 (*)    All other components within normal limits  LIPASE, BLOOD  URINALYSIS, ROUTINE W REFLEX MICROSCOPIC (NOT AT [facility])    Imaging Review Ct Abdomen Pelvis Wo Contrast  10/21/2015  CLINICAL DATA:  Abdominal pain for 2 weeks, history right nephrectomy for carcinoma EXAM: CT ABDOMEN AND PELVIS WITHOUT CONTRAST TECHNIQUE: Multidetector CT imaging of the abdomen and pelvis was performed following the standard protocol without IV contrast. COMPARISON:  09/10/2015 FINDINGS: Lung bases are free of acute infiltrate or sizable effusion. The liver, gallbladder, spleen, and left adrenal gland are within normal limits. The right adrenal gland and right kidney have been surgically removed. The left kidney shows no renal calculi or obstructive changes. Aortoiliac calcifications are noted with considerable stenting on the left. Indistinctness of the pancreatic tail is noted with some surrounding inflammatory changes The appendix is within normal limits. Diverticular change without diverticulitis is noted. The bladder is partially distended. No bony abnormality is seen. No pelvic mass lesion is seen. IMPRESSION: Postsurgical changes. Changes consistent with pancreatitis in the region of the pancreatic tail. No phlegmon or pseudocyst is seen. Electronically Signed   By: Inez Catalina M.D.   On: 10/21/2015 15:13   I have personally reviewed and evaluated these images and lab results as part of my medical decision-making.    MDM   Final diagnoses:  None    Patient  presents with complaints of possible pancreatitis. He was seen by his doctor earlier this week and had a CT scan which revealed inflammation in the pancreatic tail. He is having ongoing left flank pain and was referred here for possible admission. On  exam he is tender to palpation in the left flank, however there is no evidence for hydronephrosis or kidney stone on his prior CT. His urine is also clear. Laboratory studies reveal a lipase of 27, making pancreatitis less likely.  On exam, he is very tender in the left flank and this makes me feel that this is more likely to be a musculoskeletal pain. He has been playing golf frequently and I suspect he [place] have strained a muscle. His pain is worse when he stands, bends, or twists his torso. He is feeling better after medications given in the ER and I feel as though he is appropriate for discharge. He will be prescribed pain medication which she can take. He was also advised to rest for several days to allow things to settle down. He is to return if his symptoms worsen or change.    Veryl Speak, MD 10/23/15 [phone]

## 2015-10-23 NOTE — Telephone Encounter (Signed)
Patient is unsure of what pain meds he can take.  He has some Tramadol 50mg  that he was given last year.  But they are not helping.  He does not know of any pain meds.

## 2015-10-24 ENCOUNTER — Other Ambulatory Visit: Payer: Self-pay

## 2015-10-24 ENCOUNTER — Ambulatory Visit (INDEPENDENT_AMBULATORY_CARE_PROVIDER_SITE_OTHER): Payer: BLUE CROSS/BLUE SHIELD

## 2015-10-24 ENCOUNTER — Ambulatory Visit (INDEPENDENT_AMBULATORY_CARE_PROVIDER_SITE_OTHER): Payer: BLUE CROSS/BLUE SHIELD | Admitting: Emergency Medicine

## 2015-10-24 ENCOUNTER — Emergency Department (HOSPITAL_COMMUNITY)
Admission: EM | Admit: 2015-10-24 | Discharge: 2015-10-25 | Disposition: A | Discharge status: Home / Self Care | Payer: BLUE CROSS/BLUE SHIELD | Source: Home / Self Care | Attending: Emergency Medicine | Admitting: Emergency Medicine

## 2015-10-24 ENCOUNTER — Telehealth: Payer: Self-pay | Admitting: Emergency Medicine

## 2015-10-24 ENCOUNTER — Emergency Department (HOSPITAL_COMMUNITY): Payer: BLUE CROSS/BLUE SHIELD

## 2015-10-24 ENCOUNTER — Encounter (HOSPITAL_COMMUNITY): Payer: Self-pay | Admitting: Emergency Medicine

## 2015-10-24 VITALS — BP 122/54 | HR 88 | Temp 98.4°F | Resp 14 | Ht 69.0 in | Wt 169.2 lb

## 2015-10-24 DIAGNOSIS — Z85528 Personal history of other malignant neoplasm of kidney: Secondary | ICD-10-CM

## 2015-10-24 DIAGNOSIS — I10 Essential (primary) hypertension: Secondary | ICD-10-CM | POA: Insufficient documentation

## 2015-10-24 DIAGNOSIS — Z7982 Long term (current) use of aspirin: Secondary | ICD-10-CM

## 2015-10-24 DIAGNOSIS — R0781 Pleurodynia: Secondary | ICD-10-CM

## 2015-10-24 DIAGNOSIS — Z8674 Personal history of sudden cardiac arrest: Secondary | ICD-10-CM | POA: Insufficient documentation

## 2015-10-24 DIAGNOSIS — Z87891 Personal history of nicotine dependence: Secondary | ICD-10-CM | POA: Insufficient documentation

## 2015-10-24 DIAGNOSIS — I252 Old myocardial infarction: Secondary | ICD-10-CM

## 2015-10-24 DIAGNOSIS — E785 Hyperlipidemia, unspecified: Secondary | ICD-10-CM

## 2015-10-24 DIAGNOSIS — R0789 Other chest pain: Secondary | ICD-10-CM | POA: Insufficient documentation

## 2015-10-24 DIAGNOSIS — Z8739 Personal history of other diseases of the musculoskeletal system and connective tissue: Secondary | ICD-10-CM | POA: Insufficient documentation

## 2015-10-24 DIAGNOSIS — R791 Abnormal coagulation profile: Secondary | ICD-10-CM

## 2015-10-24 DIAGNOSIS — I2510 Atherosclerotic heart disease of native coronary artery without angina pectoris: Secondary | ICD-10-CM

## 2015-10-24 DIAGNOSIS — Z9889 Other specified postprocedural states: Secondary | ICD-10-CM

## 2015-10-24 DIAGNOSIS — K219 Gastro-esophageal reflux disease without esophagitis: Secondary | ICD-10-CM | POA: Insufficient documentation

## 2015-10-24 DIAGNOSIS — R7989 Other specified abnormal findings of blood chemistry: Secondary | ICD-10-CM

## 2015-10-24 DIAGNOSIS — Z8669 Personal history of other diseases of the nervous system and sense organs: Secondary | ICD-10-CM | POA: Insufficient documentation

## 2015-10-24 DIAGNOSIS — I5042 Chronic combined systolic (congestive) and diastolic (congestive) heart failure: Secondary | ICD-10-CM | POA: Insufficient documentation

## 2015-10-24 DIAGNOSIS — Z79899 Other long term (current) drug therapy: Secondary | ICD-10-CM

## 2015-10-24 DIAGNOSIS — M7918 Myalgia, other site: Secondary | ICD-10-CM

## 2015-10-24 DIAGNOSIS — E7800 Pure hypercholesterolemia, unspecified: Secondary | ICD-10-CM | POA: Insufficient documentation

## 2015-10-24 DIAGNOSIS — R319 Hematuria, unspecified: Secondary | ICD-10-CM | POA: Diagnosis not present

## 2015-10-24 LAB — BASIC METABOLIC PANEL
ANION GAP: 9 (ref 5–15)
BUN: 19 mg/dL (ref 6–20)
CHLORIDE: 103 mmol/L (ref 101–111)
CO2: 25 mmol/L (ref 22–32)
Calcium: 8.4 mg/dL — ABNORMAL LOW (ref 8.9–10.3)
Creatinine, Ser: 1.49 mg/dL — ABNORMAL HIGH (ref 0.61–1.24)
GFR calc Af Amer: 55 mL/min — ABNORMAL LOW (ref >60)
GFR, EST NON AFRICAN AMERICAN: 48 mL/min — AB (ref >60)
GLUCOSE: 84 mg/dL (ref 65–99)
POTASSIUM: 3.7 mmol/L (ref 3.5–5.1)
Sodium: 137 mmol/L (ref 135–145)

## 2015-10-24 LAB — POC MICROSCOPIC URINALYSIS (UMFC): MUCUS RE: ABSENT

## 2015-10-24 LAB — POCT URINALYSIS DIP (MANUAL ENTRY)
BILIRUBIN UA: NEGATIVE
GLUCOSE UA: NEGATIVE
Ketones, POC UA: NEGATIVE
Leukocytes, UA: NEGATIVE
NITRITE UA: NEGATIVE
PH UA: 5.5
Protein Ur, POC: >=300 — AB
SPEC GRAV UA: 1.02
Urobilinogen, UA: 0.2

## 2015-10-24 LAB — CBC WITH DIFFERENTIAL/PLATELET
BASOS ABS: 0 10*3/uL (ref 0.0–0.1)
Basophils Relative: 0 %
Eosinophils Absolute: 0.4 10*3/uL (ref 0.0–0.7)
Eosinophils Relative: 4 %
HEMATOCRIT: 27.4 % — AB (ref 39.0–52.0)
HEMOGLOBIN: 9.7 g/dL — AB (ref 13.0–17.0)
LYMPHS ABS: 1.1 10*3/uL (ref 0.7–4.0)
LYMPHS PCT: 10 %
MCH: 30.1 pg (ref 26.0–34.0)
MCHC: 35.4 g/dL (ref 30.0–36.0)
MCV: 85.1 fL (ref 78.0–100.0)
Monocytes Absolute: 0.9 10*3/uL (ref 0.1–1.0)
Monocytes Relative: 9 %
NEUTROS ABS: 8.1 10*3/uL — AB (ref 1.7–7.7)
NEUTROS PCT: 77 %
PLATELETS: 237 10*3/uL (ref 150–400)
RBC: 3.22 MIL/uL — AB (ref 4.22–5.81)
RDW: 13 % (ref 11.5–15.5)
WBC: 10.5 10*3/uL (ref 4.0–10.5)

## 2015-10-24 LAB — D-DIMER, QUANTITATIVE (NOT AT ARMC): D DIMER QUANT: 1.8 ug{FEU}/mL — AB (ref 0.00–0.48)

## 2015-10-24 MED ORDER — TECHNETIUM TO 99M ALBUMIN AGGREGATED
4.2000 | Freq: Once | INTRAVENOUS | Status: AC | PRN
  Administered 2015-10-24: 4 via INTRAVENOUS

## 2015-10-24 MED ORDER — TECHNETIUM TC 99M DIETHYLENETRIAME-PENTAACETIC ACID: 30.0000 | Freq: Once | INTRAVENOUS | Status: DC | PRN

## 2015-10-24 MED ORDER — SODIUM CHLORIDE 0.9 % IV BOLUS (SEPSIS)
500.0000 mL | Freq: Once | INTRAVENOUS | Status: AC
  Administered 2015-10-24: 500 mL via INTRAVENOUS

## 2015-10-24 MED ORDER — SODIUM CHLORIDE 0.9 % IV SOLN
INTRAVENOUS | Status: DC
  Administered 2015-10-24: 20:00:00 via INTRAVENOUS

## 2015-10-24 NOTE — ED Notes (Signed)
Verified with patient the ability to lie flat for ordered procedure.

## 2015-10-24 NOTE — ED Notes (Signed)
Pt ws sent here for elevated d-dimer from urgent care. Pt has chronic lt sided flank pain that he rates 5/10; pt had lt kidney removed due to cancer.

## 2015-10-24 NOTE — ED Notes (Signed)
[facility] being transported to [facility]

## 2015-10-24 NOTE — ED Notes (Signed)
Pt denies any leg or arm redness or swelling. Denies any CP. Is having R armpit pain. However pt took a muscle relaxer prior to arrival. Does endorse CP since Tuesday prior to the pain medication.

## 2015-10-24 NOTE — Progress Notes (Addendum)
By signing my name below, I, Raven Small, attest that this documentation has been prepared under the direction and in the presence of [name], MD.  Electronically Signed: Thea Alken, ED Scribe. 10/24/2015. 12:49 PM.  Chief Complaint:  Chief Complaint  Patient presents with  . Follow-up  . Back Pain    HPI: Gilbert [name] is a 65 y.o. male with hx of renal cancer who reports to [facility] today for a follow up. Pt reports waxing and waning burning, blistering pain to left flank. He states at times pain is a 5 and at times pain is a 10. He was seen here 3 days ago for left rib pain. Left rib XR showed an abnormality to 8th rib. Urine culture showed blood in his urine. CT abdomen showed pancreatitis. Pt was seen in the ED for persistent pain. Urine at that time was clear. Pain was thought to be musculoskeletal, possiblystraining a muscle while playing golf. He was prescribed flexeril which he has been taking.  He denies fever, rash, hematuria.   Past Medical History  Diagnosis Date  . Gout   . GERD (gastroesophageal reflux disease)   . Hypertension   . Hypercholesteremia   . Shortness of breath     with activity  . History of epilepsy     at least 10 years ago. 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 Family history of anesthesia complication     mother-had stroke during anesthesia  . HTN (hypertension)   . Seizures ([place])   . History of myocardial infarction less than 8 weeks     Stent placed  . CAD (coronary artery disease)   . PAD (peripheral artery disease) ([place])   . Hyperlipemia   . Therapeutic drug monitoring   . Coronary artery calcification seen on CAT scan   . SOB (shortness of breath)   . History of tobacco use   . Systolic and diastolic CHF, chronic (HCC)     Resolved. EF 45% 04/10/2014  . Essential hypertension, benign   . History of nephrectomy 11/2013    For renal cell carcinoma  . Acid reflux   . Abnormal liver enzymes     Chronic alkaline phosphatase elevation since  2014  . History of cardiac arrest 11/26/13    PTCA/Stenting of LM & Prox LAD with 4.0 x 18 mm Xience alpine stent. Used Impella Circulatory assist device. EF= 20-25%  . Coronary atherosclerosis of native coronary artery   . Cancer ([place]) 08/2013    right kidney cancer  . Cancer [facility])    Past Surgical History  Procedure Laterality Date  . No past surgeries    . Eye surgery Left 2 weeks ago    torn retina  . Robot assisted laparoscopic nephrectomy Right 10/19/2013    Procedure: ROBOTIC ASSISTED LAPAROSCOPIC NEPHRECTOMY,  EXTENSIVE ADHESIOLYSIS;  Surgeon: Alexis Frock, MD;  Location: [facility] ORS;  Service: Urology;  Laterality: Right;  . Coronary angioplasty with stent placement  2015    Left main coronary artery stenting on emergent basis along with circulatory support with Impella device through left leg. With 4.0 x 18 mm Xience alpine stent  . Nephrectomy  11/2013  . Iliac artery stent Left 05/28/2014    dr Einar Gip  . Left heart catheterization with coronary angiogram N/A 11/27/2013    Procedure: LEFT HEART CATHETERIZATION WITH CORONARY ANGIOGRAM;  Surgeon: [name], MD;  Location: [facility] CATH LAB;  Service: Cardiovascular;  Laterality: N/A;  . Percutaneous coronary stent intervention (pci-s)  11/27/2013    Procedure: PERCUTANEOUS CORONARY STENT INTERVENTION (PCI-S);  Surgeon: [name], MD;  Location: [facility] CATH LAB;  Service: Cardiovascular;;  . Lower extremity angiogram N/A 02/26/2014    Procedure: LOWER EXTREMITY ANGIOGRAM;  Surgeon: [name], MD;  Location: [facility] CATH LAB;  Service: Cardiovascular;  Laterality: N/A;  . Lower extremity angiogram N/A 05/28/2014    Procedure: LOWER EXTREMITY ANGIOGRAM;  Surgeon: [name], MD;  Location: [facility] CATH LAB;  Service: Cardiovascular;  Laterality: N/A;  . Esophagogastroduodenoscopy    . Esophagogastroduodenoscopy N/A 01/21/2015    Procedure: ESOPHAGOGASTRODUODENOSCOPY (EGD) with possible dilation.;  Surgeon: Gatha Mayer, MD;   Location: [facility] ENDOSCOPY;  Service: Endoscopy;  Laterality: N/A;  . Balloon dilation N/A 01/21/2015    Procedure: BALLOON DILATION;  Surgeon: Gatha Mayer, MD;  Location: [facility] ENDOSCOPY;  Service: Endoscopy;  Laterality: N/A;  . Cardiac catheterization N/A 06/10/2015    Procedure: Left Heart Cath and Coronary Angiography;  Surgeon: Adrian Prows, MD;  Location: [facility] CV LAB;  Service: Cardiovascular;  Laterality: N/A;   Social History   Social History  . Marital Status: Single    Spouse Name: N/A  . Number of Children: N/A  . Years of Education: N/A   Social History Main Topics  . Smoking status: Former Smoker -- 2.00 packs/day for 45 years    Types: Cigarettes    Quit date: 08/03/2003  . Smokeless tobacco: Current User    Types: Chew     Comment: quit  smoking 10 years agop  . Alcohol Use: No     Comment: quit 1980  . Drug Use: No     Comment: none in past 30 years   . Sexual Activity: Not Asked   Other Topics Concern  . None   Social History Narrative   ** Merged History Encounter **       Family History  Problem Relation Age of Onset  . Stroke Mother   . Heart disease Brother     multiple stents  . Hyperlipidemia Brother   . Emphysema Father    Allergies  Allergen Reactions  . Oxycodone Swelling and Other (See Comments)    Tongue and lip   Prior to Admission medications   Medication Sig Start Date End Date Taking? Authorizing Provider  amLODipine (NORVASC) 10 MG tablet Take 10 mg by mouth daily.   Yes Historical Provider, MD  aspirin EC 81 MG tablet Take 1 tablet (81 mg total) by mouth daily. Patient taking differently: Take 81 mg by mouth every morning.  12/11/13  Yes Daniel J Angiulli, PA-C  atorvastatin (LIPITOR) 80 MG tablet Take 80 mg by mouth daily. 09/05/15  Yes Historical Provider, MD  carvedilol (COREG) 12.5 MG tablet Take 12.5 mg by mouth 2 (two) times daily with a meal.  11/13/14  Yes Historical Provider, MD  cyclobenzaprine (FLEXERIL) 10 MG tablet Take 1  tablet (10 mg total) by mouth 3 (three) times daily as needed for muscle spasms. 10/23/15  Yes Veryl Speak, MD  EFFIENT 10 MG TABS tablet Take 10 mg by mouth daily. 09/30/15  Yes Historical Provider, MD  pantoprazole (PROTONIX) 40 MG tablet Take 40 mg by mouth every morning.  11/08/14  Yes Historical Provider, MD  PHENYTEK 300 MG ER capsule Take 1 capsule (300 mg total) by mouth every morning. 09/06/15  Yes [name], MD  polyethylene glycol (MIRALAX / GLYCOLAX) packet Take 17 g by mouth daily.   Yes Historical Provider, MD  pyridOXINE (VITAMIN B-6) 100 MG tablet Take 100 mg by mouth every morning.    Yes Historical Provider, MD  Thiamine HCl (VITAMIN B-1) 250 MG tablet Take 250 mg by mouth every morning.    Yes Historical Provider, MD  traMADol (ULTRAM) 50 MG tablet Take 50 mg by mouth every 6 (six) hours as needed.   Yes Historical Provider, MD  valsartan (DIOVAN) 160 MG tablet Take 160 mg by mouth every morning.  11/08/14  Yes Historical Provider, MD  Cyanocobalamin (B-12) 5000 MCG CAPS Take 5,000 mcg by mouth every morning.     Historical Provider, MD  Psyllium (METAMUCIL PO) Take 3 capsules by mouth every evening. Reported on 10/24/2015    Historical Provider, MD     ROS: The patient denies fevers, chills, night sweats, unintentional weight loss, chest pain, palpitations, wheezing, dyspnea on exertion, nausea, vomiting, abdominal pain, dysuria, hematuria, melena, numbness, weakness, or tingling.   All other systems have been reviewed and were otherwise negative with the exception of those mentioned in the HPI and as above.    PHYSICAL EXAM: Filed Vitals:   10/24/15 1245  BP: 122/54  Pulse: 88  Temp: 98.4 F (36.9 C)  Resp: 14   Body mass index is 24.98 kg/(m^2).   General: Alert, no acute distress HEENT:  Normocephalic, atraumatic, oropharynx patent. Eye: Juliette Mangle [place] Cardiovascular:  Regular rate and rhythm, no rubs murmurs or gallops.  No Carotid bruits, radial pulse intact. No  pedal edema.  Respiratory: Clear to auscultation bilaterally.  No wheezes, rales, or rhonchi.  No cyanosis, no use of accessory musculature Abdominal: No organomegaly, abdomen is soft and non-tender, positive bowel sounds.  No masses. Musculoskeletal: Gait intact. No edema, In right axilla with movement of right arm, there is popping in right 4th rib. Tenderness over left lateral rib.  Skin: No rashes. Neurologic: Facial musculature symmetric. Psychiatric: Patient acts appropriately throughout our interaction. Lymphatic: No cervical or submandibular lymphadenopathy    LABS: Results for orders placed or performed during the hospital encounter of 10/23/15  Lipase, blood  Result Value Ref Range   Lipase 29 11 - 51 U/L  Comprehensive metabolic panel  Result Value Ref Range   Sodium 134 (L) 135 - 145 mmol/L   Potassium 4.7 3.5 - 5.1 mmol/L   Chloride 101 101 - 111 mmol/L   CO2 25 22 - 32 mmol/L   Glucose, Bld 101 (H) 65 - 99 mg/dL   BUN 15 6 - 20 mg/dL   Creatinine, Ser 1.35 (H) 0.61 - 1.24 mg/dL   Calcium 8.8 (L) 8.9 - 10.3 mg/dL   Total Protein 6.0 (L) 6.5 - 8.1 g/dL   Albumin 2.3 (L) 3.5 - 5.0 g/dL   AST 17 15 - 41 U/L   ALT 12 (L) 17 - 63 U/L   Alkaline Phosphatase 136 (H) 38 - 126 U/L   Total Bilirubin 0.8 0.3 - 1.2 mg/dL   GFR calc non Af Amer 54 (L) >60 mL/min   GFR calc Af Amer >60 >60 mL/min   Anion gap 8 5 - 15  CBC  Result Value Ref Range   WBC 9.5 4.0 - 10.5 K/uL   RBC 3.65 (L) 4.22 - 5.81 MIL/uL   Hemoglobin 10.5 (L) 13.0 - 17.0 g/dL   HCT 30.8 (L) 39.0 - 52.0 %   MCV 84.4 78.0 - 100.0 fL   MCH 28.8 26.0 - 34.0 pg   MCHC 34.1 30.0 - 36.0 g/dL   RDW 12.5 11.5 -  15.5 %   Platelets 177 150 - 400 K/uL  Urinalysis, Routine w reflex microscopic (not at [facility])  Result Value Ref Range   Color, Urine YELLOW YELLOW   APPearance CLEAR CLEAR   Specific Gravity, Urine 1.010 1.005 - 1.030   pH 6.0 5.0 - 8.0   Glucose, UA NEGATIVE NEGATIVE mg/dL   Hgb urine dipstick  MODERATE (A) NEGATIVE   Bilirubin Urine NEGATIVE NEGATIVE   Ketones, ur NEGATIVE NEGATIVE mg/dL   Protein, ur >300 (A) NEGATIVE mg/dL   Nitrite NEGATIVE NEGATIVE   Leukocytes, UA NEGATIVE NEGATIVE  Urine microscopic-add on  Result Value Ref Range   Squamous Epithelial / LPF 0-5 (A) NONE SEEN   WBC, UA 0-5 0 - 5 WBC/hpf   RBC / HPF 0-5 0 - 5 RBC/hpf   Bacteria, UA RARE (A) NONE SEEN   Casts GRANULAR CAST (A) NEGATIVE     EKG/XRAY:   Primary read interpreted by Dr. Everlene Farrier at [facility]. Ct Abdomen Pelvis Wo Contrast  10/21/2015  CLINICAL DATA:  Abdominal pain for 2 weeks, history right nephrectomy for carcinoma EXAM: CT ABDOMEN AND PELVIS WITHOUT CONTRAST TECHNIQUE: Multidetector CT imaging of the abdomen and pelvis was performed following the standard protocol without IV contrast. COMPARISON:  09/10/2015 FINDINGS: Lung bases are free of acute infiltrate or sizable effusion. The liver, gallbladder, spleen, and left adrenal gland are within normal limits. The right adrenal gland and right kidney have been surgically removed. The left kidney shows no renal calculi or obstructive changes. Aortoiliac calcifications are noted with considerable stenting on the left. Indistinctness of the pancreatic tail is noted with some surrounding inflammatory changes The appendix is within normal limits. Diverticular change without diverticulitis is noted. The bladder is partially distended. No bony abnormality is seen. No pelvic mass lesion is seen. IMPRESSION: Postsurgical changes. Changes consistent with pancreatitis in the region of the pancreatic tail. No phlegmon or pseudocyst is seen. Electronically Signed   By: Inez Catalina M.D.   On: 10/21/2015 15:13   Dg Ribs Unilateral Left  10/21/2015  CLINICAL DATA:  Left lower rib pain for several days, no acute injury EXAM: LEFT RIBS - 2 VIEW COMPARISON:  CT chest of 09/10/2015 FINDINGS: No definite acute fracture is seen. However on 1 oblique view, there is questionable  loss of the cortical margin of the left anterior lateral eighth rib. I did review the CT of the chest from 09/10/2015 showing no bony abnormality at that time. Therefore this finding is of questionable significance. If pain persists repeat left rib films would be recommended in 1-2 weeks. IMPRESSION: 1. No definite left rib fracture. 2. However, slight indistinctness of the cortex of the left anterolateral eighth rib is noted and if pain persists followup films of the left ribs are recommended in 1-2 weeks. Electronically Signed   By: Ivar Drape M.D.   On: 10/21/2015 09:33   Dg Ribs Unilateral W/chest Right  10/24/2015  CLINICAL DATA:  Right ribcage pain of uncertain etiology; no mention of trauma. EXAM: RIGHT RIBS AND CHEST - 3+ VIEW COMPARISON:  Chest x-ray of [place] 21, 2017 FINDINGS: The lungs are well-expanded. There is blunting of the costophrenic angles bilaterally. There is no alveolar infiltrate. The heart and pulmonary vascularity are normal. The mediastinum is normal in width. Nipple shadows are visible bilaterally. Right rib detail images reveal no lytic or blastic lesion nor an acute or healing rib fracture. There is old contour deformity of the anterior aspect of the right fifth rib.  IMPRESSION: Small bilateral pleural effusions. Hyperinflation consistent with COPD or reactive airway disease. No acute rib abnormalities are observed. Further evaluation of the thorax with CT scanning is recommended in an effort to determine an etiology for the small pleural effusions. Electronically Signed   By: David  Martinique M.D.   On: 10/24/2015 13:25   Dg Abd Acute W/chest  10/21/2015  CLINICAL DATA:  Abdominal distention for 4 days.  Chest pain. EXAM: DG ABDOMEN ACUTE W/ 1V CHEST COMPARISON:  CT chest, abdomen, and pelvis September 10, 2015; chest radiograph [place] 12, 2016 FINDINGS: PA chest: There is no edema or consolidation. Heart size and pulmonary vascularity are normal. Reactive right hilar adenopathy seen on  recent CT is not appreciable on radiographic examination. Supine and upright abdomen: There is moderate stool throughout the colon. There is no bowel dilatation or air-fluid level suggesting obstruction. No free air. There is a stent involving the left common and external iliac arteries extending to the proximal common femoral artery on the left. IMPRESSION: Moderate stool in colon. No bowel obstruction or free air evident. No lung edema or consolidation. Electronically Signed   By: Lowella Grip III M.D.   On: 10/21/2015 09:31    ASSESSMENT/PLAN: Complicated history of bilateral chest pain along with some shortness of breath and pain in his back. Chest x-ray shows small bilateral effusions. D-dimer has returned elevated at 1.8. Patient sent back to the ER  for reevaluation to rule out pulmonary embolus. Patient will be called and instructed. I talked with his daughter Leafy Ro. Patient will go to the emergency room at [facility] to be evaluated for probable ventilation/perfusion scan. He only has one kidney and is not a candidate to have a CT angiogram.I personally performed the services described in this documentation, which was scribed in my presence. The recorded information has been reviewed and is accurate. The emergency room at [facility] is [name] sideeffects, risk and benefits, and alternatives of medications d/w patient. Patient is aware that all medications have potential sideeffects and we are unable to predict every sideeffect or drug-drug interaction that Dohner occur.  [name] MD 10/24/2015 12:49 PM

## 2015-10-24 NOTE — Patient Instructions (Signed)
     IF you received an x-ray today, you will receive an invoice from [facility]. Please contact [facility] at [phone] with questions or concerns regarding your invoice.   IF you received labwork today, you will receive an invoice from [facility]. Please contact [facility] at [phone] with questions or concerns regarding your invoice.   Our billing staff will not be able to assist you with questions regarding bills from these companies.  You will be contacted with the lab results as soon as they are available. The fastest way to get your results is to activate your My Chart account. Instructions are located on the last page of this paperwork. If you have not heard from us regarding the results in 2 weeks, please contact this office.      

## 2015-10-24 NOTE — Telephone Encounter (Signed)
I called and spoke with the patient and his daughter as well as the triage nurse at [facility] emergency room. Patient has a significantly elevated d-dimer. He was sent to [facility] emergency room for evaluation and consideration for ventilation/perfusion scan or Dopplers

## 2015-10-24 NOTE — Telephone Encounter (Signed)
Left message for pt to call back  °

## 2015-10-24 NOTE — Discharge Instructions (Signed)
Continue your current medication treatments. Use heat on the sore areas 3 or 4 times a day. Use ibuprofen or Tylenol as needed for pain.   Musculoskeletal Pain Musculoskeletal pain is muscle and boney aches and pains. These pains can occur in any part of the body. Your caregiver Blizzard treat you without knowing the cause of the pain. They Stangeland treat you if blood or urine tests, X-rays, and other tests were normal.  CAUSES There is often not a definite cause or reason for these pains. These pains Jessop be caused by a type of germ (virus). The discomfort Regas also come from overuse. Overuse includes working out too hard when your body is not fit. Boney aches also come from weather changes. Bone is sensitive to atmospheric pressure changes. HOME CARE INSTRUCTIONS   Ask when your test results will be ready. Make sure you get your test results.  Only take over-the-counter or prescription medicines for pain, discomfort, or fever as directed by your caregiver. If you were given medications for your condition, do not drive, operate machinery or power tools, or sign legal documents for 24 hours. Do not drink alcohol. Do not take sleeping pills or other medications that Fenlon interfere with treatment.  Continue all activities unless the activities cause more pain. When the pain lessens, slowly resume normal activities. Gradually increase the intensity and duration of the activities or exercise.  During periods of severe pain, bed rest Denio be helpful. Lay or sit in any position that is comfortable.  Putting ice on the injured area.  Put ice in a bag.  Place a towel between your skin and the bag.  Leave the ice on for 15 to 20 minutes, 3 to 4 times a day.  Follow up with your caregiver for continued problems and no reason can be found for the pain. If the pain becomes worse or does not go away, it Mendiola be necessary to repeat tests or do additional testing. Your caregiver Trimarco need to look further for a possible  cause. SEEK IMMEDIATE MEDICAL CARE IF:  You have pain that is getting worse and is not relieved by medications.  You develop chest pain that is associated with shortness or breath, sweating, feeling sick to your stomach (nauseous), or throw up (vomit).  Your pain becomes localized to the abdomen.  You develop any new symptoms that seem different or that concern you. MAKE SURE YOU:   Understand these instructions.  Will watch your condition.  Will get help right away if you are not doing well or get worse.   This information is not intended to replace advice given to you by your health care provider. Make sure you discuss any questions you have with your health care provider.   Document Released: 07/19/2005 Document Revised: 10/11/2011 Document Reviewed: 03/23/2013 Elsevier Interactive Patient Education [employer].

## 2015-10-24 NOTE — Telephone Encounter (Signed)
He went to the ED

## 2015-10-24 NOTE — ED Provider Notes (Addendum)
CSN: [id number]     Arrival date & time 10/24/15  1732 History   First MD Initiated Contact with Patient 10/24/15 1753     Chief Complaint  Patient presents with  . Abnormal Lab    elevated d-dimer     (Consider location/radiation/quality/duration/timing/severity/associated sxs/prior Treatment) HPI   [name] is a 65 y.o. male who presents for evaluation of left lower back pain and right axillary pain. Today, he was seen by his PCP, who ordered a d-dimer and found to be elevated. Because of that he sent the patient here to be evaluated for a pulmonary embolus. The patient denies chest pain, shortness of breath, fever, chills, nausea, vomiting, weakness or dizziness. He has a chronic cough, productive of white sputum. The cough is intermittent. He was evaluated by his PCP, 3 days ago, with a CT scan of the abdomen and pelvis. At that time he was diagnosed with pancreatitis. Yesterday, he came to the ED for consideration of admission, but was discharged after treatment. He was treated with Flexeril, which he is taking. This has not changed his pain. The patient denies hemoptysis. 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 No prior history of venous thromboembolism. No history of PE. There are no other known modifying factors.   Past Medical History  Diagnosis Date  . Gout   . GERD (gastroesophageal reflux disease)   . Hypertension   . Hypercholesteremia   . Shortness of breath     with activity  . History of epilepsy     at least 10 years ago. 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 Family history of anesthesia complication     mother-had stroke during anesthesia  . HTN (hypertension)   . Seizures ([place])   . History of myocardial infarction less than 8 weeks     Stent placed  . CAD (coronary artery disease)   . PAD (peripheral artery disease) ([place])   . Hyperlipemia   . Therapeutic drug monitoring   . Coronary artery calcification seen on CAT scan   . SOB  (shortness of breath)   . History of tobacco use   . Systolic and diastolic CHF, chronic (HCC)     Resolved. EF 45% 04/10/2014  . Essential hypertension, benign   . History of nephrectomy 11/2013    For renal cell carcinoma  . Acid reflux   . Abnormal liver enzymes     Chronic alkaline phosphatase elevation since 2014  . History of cardiac arrest 11/26/13    PTCA/Stenting of LM & Prox LAD with 4.0 x 18 mm Xience alpine stent. Used Impella Circulatory assist device. EF= 20-25%  . Coronary atherosclerosis of native coronary artery   . Cancer ([place]) 08/2013    right kidney cancer  . Cancer [facility])    Past Surgical History  Procedure Laterality Date  . No past surgeries    . Eye surgery Left 2 weeks ago    torn retina  . Robot assisted laparoscopic nephrectomy Right 10/19/2013    Procedure: ROBOTIC ASSISTED LAPAROSCOPIC NEPHRECTOMY,  EXTENSIVE ADHESIOLYSIS;  Surgeon: Alexis Frock, MD;  Location: [facility] ORS;  Service: Urology;  Laterality: Right;  . Coronary angioplasty with stent placement  2015    Left main coronary artery stenting on emergent basis along with circulatory support with Impella device through left leg. With 4.0 x 18 mm Xience alpine stent  . Nephrectomy  11/2013  . Iliac artery stent Left 05/28/2014    dr Einar Gip  . Left  heart catheterization with coronary angiogram N/A 11/27/2013    Procedure: LEFT HEART CATHETERIZATION WITH CORONARY ANGIOGRAM;  Surgeon: [name], MD;  Location: [facility] CATH LAB;  Service: Cardiovascular;  Laterality: N/A;  . Percutaneous coronary stent intervention (pci-s)  11/27/2013    Procedure: PERCUTANEOUS CORONARY STENT INTERVENTION (PCI-S);  Surgeon: [name], MD;  Location: [facility] CATH LAB;  Service: Cardiovascular;;  . Lower extremity angiogram N/A 02/26/2014    Procedure: LOWER EXTREMITY ANGIOGRAM;  Surgeon: [name], MD;  Location: [facility] CATH LAB;  Service: Cardiovascular;  Laterality: N/A;  . Lower extremity angiogram N/A 05/28/2014     Procedure: LOWER EXTREMITY ANGIOGRAM;  Surgeon: [name], MD;  Location: [facility] CATH LAB;  Service: Cardiovascular;  Laterality: N/A;  . Esophagogastroduodenoscopy    . Esophagogastroduodenoscopy N/A 01/21/2015    Procedure: ESOPHAGOGASTRODUODENOSCOPY (EGD) with possible dilation.;  Surgeon: Gatha Mayer, MD;  Location: [facility] ENDOSCOPY;  Service: Endoscopy;  Laterality: N/A;  . Balloon dilation N/A 01/21/2015    Procedure: BALLOON DILATION;  Surgeon: Gatha Mayer, MD;  Location: [facility] ENDOSCOPY;  Service: Endoscopy;  Laterality: N/A;  . Cardiac catheterization N/A 06/10/2015    Procedure: Left Heart Cath and Coronary Angiography;  Surgeon: Adrian Prows, MD;  Location: [facility];  Service: Cardiovascular;  Laterality: N/A;   Family History  Problem Relation Age of Onset  . Stroke Mother   . Heart disease Brother     multiple stents  . Hyperlipidemia Brother   . Emphysema Father    Social History  Substance Use Topics  . Smoking status: Former Smoker -- 2.00 packs/day for 45 years    Types: Cigarettes    Quit date: 08/03/2003  . Smokeless tobacco: Current User    Types: Chew     Comment: quit  smoking 10 years agop  . Alcohol Use: No     Comment: quit 1980    Review of Systems  All other systems reviewed and are negative.     Allergies  Oxycodone  Home Medications   Prior to Admission medications   Medication Sig Start Date End Date Taking? Authorizing Provider  amLODipine (NORVASC) 10 MG tablet Take 10 mg by mouth daily.   Yes Historical Provider, MD  aspirin EC 81 MG tablet Take 1 tablet (81 mg total) by mouth daily. Patient taking differently: Take 81 mg by mouth every morning.  12/11/13  Yes Daniel J Angiulli, PA-C  atorvastatin (LIPITOR) 80 MG tablet Take 80 mg by mouth daily. 09/05/15  Yes Historical Provider, MD  carvedilol (COREG) 12.5 MG tablet Take 12.5 mg by mouth 2 (two) times daily with a meal.  11/13/14  Yes Historical Provider, MD  Cyanocobalamin (B-12)  5000 MCG CAPS Take 5,000 mcg by mouth every morning.    Yes Historical Provider, MD  cyclobenzaprine (FLEXERIL) 10 MG tablet Take 1 tablet (10 mg total) by mouth 3 (three) times daily as needed for muscle spasms. Patient taking differently: Take 5 mg by mouth 3 (three) times daily as needed for muscle spasms.  10/23/15  Yes Veryl Speak, MD  EFFIENT 10 MG TABS tablet Take 10 mg by mouth daily. 09/30/15  Yes Historical Provider, MD  pantoprazole (PROTONIX) 40 MG tablet Take 40 mg by mouth every morning.  11/08/14  Yes Historical Provider, MD  PHENYTEK 300 MG ER capsule Take 1 capsule (300 mg total) by mouth every morning. 09/06/15  Yes [name], MD  polyethylene glycol (MIRALAX / GLYCOLAX) packet Take 17 g  by mouth daily.   Yes Historical Provider, MD  Psyllium (METAMUCIL PO) Take 3 capsules by mouth every evening. Reported on 10/24/2015   Yes Historical Provider, MD  pyridOXINE (VITAMIN B-6) 100 MG tablet Take 100 mg by mouth every morning.    Yes Historical Provider, MD  Thiamine HCl (VITAMIN B-1) 250 MG tablet Take 250 mg by mouth every morning.    Yes Historical Provider, MD  traMADol (ULTRAM) 50 MG tablet Take 50 mg by mouth every 6 (six) hours as needed for moderate pain or severe pain.    Yes Historical Provider, MD  valsartan (DIOVAN) 160 MG tablet Take 160 mg by mouth every morning.  11/08/14  Yes Historical Provider, MD   BP 159/63 mmHg  Pulse 70  Temp(Src) 97.7 F (36.5 C) (Oral)  Resp 23  SpO2 95% Physical Exam  Constitutional: He is oriented to person, place, and time. He appears well-developed and well-nourished. No distress.  HENT:  Head: Normocephalic and atraumatic.  Right Ear: External ear normal.  Left Ear: External ear normal.  Eyes: Conjunctivae and EOM are normal. Pupils are equal, round, and reactive to light.  Neck: Normal range of motion and phonation normal. Neck supple.  Cardiovascular: Normal rate, regular rhythm and normal heart sounds.   Pulmonary/Chest: Effort  normal and breath sounds normal. He exhibits no bony tenderness.  Abdominal: Soft. There is no tenderness.  Musculoskeletal: Normal range of motion.  Mild right axillary chest wall tenderness without palpable deformity or crepitation. Mild left lower lumbar tenderness to palpation. No tenderness of the cervical, thoracic or lumbar spines.  Neurological: He is alert and oriented to person, place, and time. No cranial nerve deficit or sensory deficit. He exhibits normal muscle tone. Coordination normal.  Skin: Skin is warm, dry and intact.  Psychiatric: He has a normal mood and affect. His behavior is normal. Judgment and thought content normal.  Nursing note and vitals reviewed.   ED Course  Procedures (including critical care time)  Initial Clinical impression- likely musculoskeletal pain, ongoing, without specific known provocation. Elevated d-dimer with right chest pain raises the suspicion of PE however, the patient is low risk.  Medications  0.9 %  sodium chloride infusion ( Intravenous New Bag/Given 10/24/15 1954)  technetium TC 81M diethylenetriame-pentaacetic acid (DTPA) injection 30 milli Curie (not administered)  sodium chloride 0.9 % bolus 500 mL (0 mLs Intravenous Stopped 10/24/15 1948)  technetium albumin aggregated (MAA) injection solution 4 milli Curie (4 milli Curies Intravenous Contrast Given 10/24/15 2317)    Patient Vitals for the past 24 hrs:  BP Temp Temp src Pulse Resp SpO2  10/24/15 2200 159/63 mmHg - - 70 23 95 %  10/24/15 2100 163/66 mmHg - - 69 13 97 %  10/24/15 2030 158/63 mmHg - - - 22 -  10/24/15 2000 161/59 mmHg - - 68 21 99 %  10/24/15 1930 147/94 mmHg - - 69 20 100 %  10/24/15 1900 156/72 mmHg - - 64 24 100 %  10/24/15 1843 146/57 mmHg - - 63 18 97 %  10/24/15 1743 145/83 mmHg 97.7 F (36.5 C) Oral 69 16 97 %    19:35- Renal function is somewhat worse, then yesterday, possibly related to recent CT imaging. With the concern for PE, he will require imaging  with VQ scan  11:55 PM Reevaluation with update and discussion. After initial assessment and treatment, an updated evaluation reveals Patient is resting comfortably. No further complaints. Findings discussed with patient and family members, all  questions were answered. [place] Review Labs Reviewed  CBC WITH DIFFERENTIAL/PLATELET - Abnormal; Notable for the following:    RBC 3.22 (*)    Hemoglobin 9.7 (*)    HCT 27.4 (*)    Neutro Abs 8.1 (*)    All other components within normal limits  BASIC METABOLIC PANEL - Abnormal; Notable for the following:    Creatinine, Ser 1.49 (*)    Calcium 8.4 (*)    GFR calc non Af Amer 48 (*)    GFR calc Af Amer 55 (*)    All other components within normal limits    Imaging Review Dg Ribs Unilateral W/chest Right  10/24/2015  CLINICAL DATA:  Right ribcage pain of uncertain etiology; no mention of trauma. EXAM: RIGHT RIBS AND CHEST - 3+ VIEW COMPARISON:  Chest x-ray of [place] [address], 2017 FINDINGS: The lungs are well-expanded. There is blunting of the costophrenic angles bilaterally. There is no alveolar infiltrate. The heart and pulmonary vascularity are normal. The mediastinum is normal in width. Nipple shadows are visible bilaterally. Right rib detail images reveal no lytic or blastic lesion nor an acute or healing rib fracture. There is old contour deformity of the anterior aspect of the right fifth rib. IMPRESSION: Small bilateral pleural effusions. Hyperinflation consistent with COPD or reactive airway disease. No acute rib abnormalities are observed. Further evaluation of the thorax with CT scanning is recommended in an effort to determine an etiology for the small pleural effusions. Electronically Signed   By: David  Martinique M.D.   On: 10/24/2015 13:25   Nm Pulmonary Perf And Vent  10/24/2015  CLINICAL DATA:  Chest pain, elevated D-dimer, elevated creatinine EXAM: NUCLEAR MEDICINE VENTILATION - PERFUSION LUNG SCAN TECHNIQUE: Ventilation images  were obtained in multiple projections using inhaled aerosol Tc-8m DTPA. Perfusion images were obtained in multiple projections after intravenous injection of Tc-71m MAA. RADIOPHARMACEUTICALS:  30 mCi Technetium-26m DTPA aerosol inhalation and 4.2 mCi Technetium-44m MAA IV COMPARISON:  Chest x-ray 10/24/2015 FINDINGS: Ventilation: No focal ventilation defect. There is some clumping of the tracer in central airways. Diffuse mild decreased ventilation lung apices is suspicious for emphysematous changes. Perfusion: No wedge shaped peripheral perfusion defects to suggest acute pulmonary embolism. Chest x-ray shows no acute infiltrate or pulmonary edema. Hyperinflation is noted. Findings are low probability for pulmonary embolus. IMPRESSION: Low probability for pulmonary embolus. Electronically Signed   By: Lahoma Crocker M.D.   On: 10/24/2015 23:42   I have personally reviewed and evaluated these images and lab results as part of my medical decision-making.   EKG Interpretation None         Date: 10/24/15-   Rate: 62  Rhythm: normal sinus rhythm  QRS Axis: normal  PR and QT Intervals: normal  ST/T Wave abnormalities: nonspecific ST/T changes  PR and QRS Conduction Disutrbances:none  Narrative Interpretation:   Old EKG Reviewed: changes noted    MDM   Final diagnoses:  Musculoskeletal pain    Nonspecific musculoskeletal pain. Evaluation for PE was done using VQ scan, because of elevated d-dimer. VQ scan is low probability for emboli. There is no indication for further evaluation or treatment, at this time.  Nursing Notes Reviewed/ Care Coordinated Applicable Imaging Reviewed Interpretation of Laboratory Data incorporated into ED treatment  The patient appears reasonably screened and/or stabilized for discharge and I doubt any other medical condition or other [facility] requiring further screening, evaluation, or treatment in the ED at this time prior to discharge.  Plan: Home Medications- usual;  Home Treatments- rest; return here if the recommended treatment, does not improve the symptoms; Recommended follow up- PCP prn     Daleen Bo, MD 10/24/15 2359  Daleen Bo, MD 10/28/15 2352

## 2015-10-24 NOTE — ED Notes (Signed)
Nuclear medicine called to explain delay in procedure. Patient is being updated frequently.

## 2015-10-24 NOTE — ED Notes (Signed)
Pt being sent by Daub MD c/o bilateral chest discomfort.  Daub MD reports x-rays shows pleural effusions and D-dimer 1.8.  Hx of pancreatitis, renal CA, and nephrectomy.

## 2015-10-25 ENCOUNTER — Telehealth: Payer: Self-pay | Admitting: Emergency Medicine

## 2015-10-25 NOTE — Telephone Encounter (Signed)
Please call patient and check on status. Be sure he sees me either Monday or Tuesday for follow-up. Tell him I am very happy his scan did not show  A clot.

## 2015-10-28 NOTE — Telephone Encounter (Signed)
Called and check status.  Pt stated that he is doing ok.  He will be in tomorrow for follow up since we called him late and Dr. Everlene Farrier leaves at 2.    When pt get here tomorrow he is to be fast tracked to see Dr. Everlene Farrier.

## 2015-10-29 ENCOUNTER — Ambulatory Visit (INDEPENDENT_AMBULATORY_CARE_PROVIDER_SITE_OTHER): Payer: BLUE CROSS/BLUE SHIELD | Admitting: Emergency Medicine

## 2015-10-29 VITALS — BP 138/68 | HR 67 | Temp 97.6°F | Resp 16 | Ht 69.0 in | Wt 165.0 lb

## 2015-10-29 DIAGNOSIS — R0781 Pleurodynia: Secondary | ICD-10-CM

## 2015-10-29 DIAGNOSIS — R1084 Generalized abdominal pain: Secondary | ICD-10-CM | POA: Diagnosis not present

## 2015-10-29 DIAGNOSIS — R319 Hematuria, unspecified: Secondary | ICD-10-CM | POA: Diagnosis not present

## 2015-10-29 LAB — POC MICROSCOPIC URINALYSIS (UMFC): MUCUS RE: ABSENT

## 2015-10-29 LAB — POCT URINALYSIS DIP (MANUAL ENTRY)
Bilirubin, UA: NEGATIVE
GLUCOSE UA: NEGATIVE
Ketones, POC UA: NEGATIVE
Leukocytes, UA: NEGATIVE
NITRITE UA: NEGATIVE
PH UA: 5.5
Spec Grav, UA: 1.02
UROBILINOGEN UA: 0.2

## 2015-10-29 NOTE — Progress Notes (Addendum)
By signing my name below, I, Moises Blood, attest that this documentation has been prepared under the direction and in the presence of [name], MD. Electronically Signed: Moises Blood, [place]. 10/29/2015 , 9:57 AM .  Patient was seen in room 8 .  Chief Complaint:  Chief Complaint  Patient presents with   Follow-up    HPI: Gilbert [name] is a 65 y.o. male who reports to [facility] today for follow up.  Seen initially for left upper and left lower chest discomfort; xray for left rib : questionable loss of anterior lateral 8th rib. He had a follow up with symptoms and elevated d dimer; sent to ED where ventilation scan was performed and was normal. He already had a CT abd pelvis for pancreatitis but lipase was normal, has abnl urine and proteinuria, status post nephrectomy for renal cancer.   He states that he's feeling better than previous visit. He still feels an occasional popping in his right rib. He denies any appetite loss.   Past Medical History  Diagnosis Date   Gout    GERD (gastroesophageal reflux disease)    Hypertension    Hypercholesteremia    Shortness of breath     with activity   History of epilepsy     at least 10 years ago. 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 EF 45% 04/10/2014   Essential hypertension, benign    History of nephrectomy 11/2013    For renal cell carcinoma   Acid reflux    Abnormal liver enzymes     Chronic alkaline phosphatase elevation since 2014     History of cardiac arrest 11/26/13    PTCA/Stenting of LM & Prox LAD with 4.0 x 18 mm Xience alpine stent. Used Impella Circulatory assist device. EF= 20-25%   Coronary atherosclerosis of native coronary artery    Cancer ([place]) 08/2013    right kidney cancer   Cancer [facility])    Past Surgical History  Procedure Laterality Date   No past surgeries     Eye surgery Left 2 weeks ago    torn retina   Robot assisted laparoscopic nephrectomy Right 10/19/2013    Procedure: ROBOTIC ASSISTED LAPAROSCOPIC NEPHRECTOMY,  EXTENSIVE ADHESIOLYSIS;  Surgeon: Alexis Frock, MD;  Location: [facility];  Service: Urology;  Laterality: Right;   Coronary angioplasty with stent placement  2015    Left main coronary artery stenting on emergent basis along with circulatory support with Impella device through left leg. With 4.0 x 18 mm Xience alpine stent   Nephrectomy  11/2013   Iliac artery stent Left 05/28/2014    dr Einar Gip   Left heart catheterization with coronary angiogram N/A 11/27/2013    Procedure: LEFT HEART CATHETERIZATION WITH CORONARY ANGIOGRAM;  Surgeon: [name], MD;  Location: [facility] CATH LAB;  Service: Cardiovascular;  Laterality: N/A;   Percutaneous coronary stent intervention (pci-s)  11/27/2013    Procedure:  PERCUTANEOUS CORONARY STENT INTERVENTION (PCI-S);  Surgeon: [name], MD;  Location: [facility] CATH LAB;  Service: Cardiovascular;;   Lower extremity angiogram N/A 02/26/2014    Procedure: LOWER EXTREMITY ANGIOGRAM;  Surgeon: [name], MD;  Location: [facility] CATH LAB;  Service: Cardiovascular;  Laterality: N/A;   Lower extremity angiogram N/A 05/28/2014    Procedure: LOWER EXTREMITY ANGIOGRAM;  Surgeon: [name], MD;  Location: [facility] CATH LAB;  Service: Cardiovascular;  Laterality: N/A;   Esophagogastroduodenoscopy     Esophagogastroduodenoscopy N/A 01/21/2015    Procedure: ESOPHAGOGASTRODUODENOSCOPY (EGD) with possible dilation.;  Surgeon: Gatha Mayer, MD;  Location:  [place];  Service: Endoscopy;  Laterality: N/A;   Balloon dilation N/A 01/21/2015    Procedure: BALLOON DILATION;  Surgeon: Gatha Mayer, MD;  Location: [facility] ENDOSCOPY;  Service: Endoscopy;  Laterality: N/A;   Cardiac catheterization N/A 06/10/2015    Procedure: Left Heart Cath and Coronary Angiography;  Surgeon: Adrian Prows, MD;  Location: [place] CV LAB;  Service: Cardiovascular;  Laterality: N/A;   Social History   Social History   Marital Status: Single    Spouse Name: N/A   Number of Children: N/A   Years of Education: N/A   Social History Main Topics   Smoking status: Former Smoker -- 2.00 packs/day for 45 years    Types: Cigarettes    Quit date: 08/03/2003   Smokeless tobacco: Current User    Types: Chew     Comment: quit  smoking 10 years agop   Alcohol Use: No     Comment: quit 1980   Drug Use: No     Comment: none in past 30 years    Sexual Activity: Not Asked   Other Topics Concern   None   Social History Narrative   ** Merged History Encounter **       Family History  Problem Relation Age of Onset   Stroke Mother    Heart disease Brother     multiple stents   Hyperlipidemia Brother    Emphysema Father    Allergies  Allergen Reactions   Oxycodone Swelling and Other (See Comments)    Tongue and lip   Prior to Admission medications   Medication Sig Start Date End Date Taking? Authorizing Provider  amLODipine (NORVASC) 10 MG tablet Take 10 mg by mouth daily.    Historical Provider, MD  aspirin EC 81 MG tablet Take 1 tablet (81 mg total) by mouth daily. Patient taking differently: Take 81 mg by mouth every morning.  12/11/13   Lavon Paganini Angiulli, PA-C  atorvastatin (LIPITOR) 80 MG tablet Take 80 mg by mouth daily. 09/05/15   Historical Provider, MD  carvedilol (COREG) 12.5 MG tablet Take 12.5 mg by mouth 2 (two) times daily with a meal.  11/13/14   Historical Provider, MD  Cyanocobalamin (B-12) 5000 MCG CAPS Take 5,000 mcg by mouth every  morning.     Historical Provider, MD  cyclobenzaprine (FLEXERIL) 10 MG tablet Take 1 tablet (10 mg total) by mouth 3 (three) times daily as needed for muscle spasms. Patient taking differently: Take 5 mg by mouth 3 (three) times daily as needed for muscle spasms.  10/23/15   Veryl Speak, MD  EFFIENT 10 MG TABS tablet Take 10 mg by mouth daily. 09/30/15   Historical Provider, MD  pantoprazole (PROTONIX) 40 MG tablet Take 40 mg by mouth every morning.  11/08/14   Historical Provider, MD  PHENYTEK 300 MG ER capsule Take 1 capsule (  300 mg total) by mouth every morning. 09/06/15   [name], MD  polyethylene glycol (MIRALAX / GLYCOLAX) packet Take 17 g by mouth daily.    Historical Provider, MD  Psyllium (METAMUCIL PO) Take 3 capsules by mouth every evening. Reported on 10/24/2015    Historical Provider, MD  pyridOXINE (VITAMIN B-6) 100 MG tablet Take 100 mg by mouth every morning.     Historical Provider, MD  Thiamine HCl (VITAMIN B-1) 250 MG tablet Take 250 mg by mouth every morning.     Historical Provider, MD  traMADol (ULTRAM) 50 MG tablet Take 50 mg by mouth every 6 (six) hours as needed for moderate pain or severe pain.     Historical Provider, MD  valsartan (DIOVAN) 160 MG tablet Take 160 mg by mouth every morning.  11/08/14   Historical Provider, MD     ROS:  Constitutional: negative for fever, chills, night sweats, weight changes, or fatigue  HEENT: negative for vision changes, hearing loss, congestion, rhinorrhea, ST, epistaxis, or sinus pressure Cardiovascular: negative for chest pain or palpitations Respiratory: negative for hemoptysis, wheezing, shortness of breath, or cough Abdominal: negative for abdominal pain, nausea, vomiting, diarrhea, or constipation Dermatological: negative for rash Neurologic: negative for headache, dizziness, or syncope All other systems reviewed and are otherwise negative with the exception to those above and in the HPI.  PHYSICAL EXAM: Filed Vitals:    10/29/15 0941  BP: 138/68  Pulse: 67  Temp: 97.6 F (36.4 C)  Resp: 16   Body mass index is 24.36 kg/(m^2).   General: Alert, no acute distress HEENT:  Normocephalic, atraumatic, oropharynx patent. Eye: Juliette Mangle [facility] Cardiovascular:  Regular rate and rhythm, no rubs murmurs or gallops.  No Carotid bruits, radial pulse intact. No pedal edema.  Respiratory: Clear to auscultation bilaterally.  No wheezes, rales, or rhonchi.  No cyanosis, no use of accessory musculature Abdominal: No organomegaly, abdomen is soft and non-tender, positive bowel sounds. No masses. Musculoskeletal: Gait intact. No edema, tenderness Skin: No rashes. Neurologic: Facial musculature symmetric. Psychiatric: Patient acts appropriately throughout our interaction.  Lymphatic: No cervical or submandibular lymphadenopathy Genitourinary/Anorectal: No acute findings   LABS: Results for orders placed or performed in visit on 10/29/15  POCT Microscopic Urinalysis ([facility])  Result Value Ref Range   WBC,UR,HPF,POC Few (A) None WBC/hpf   RBC,UR,HPF,POC Moderate (A) None RBC/hpf   Bacteria None None, Too numerous to count   Mucus Absent Absent   Epithelial Cells, UR Per Microscopy Few (A) None, Too numerous to count cells/hpf  POCT urinalysis dipstick  Result Value Ref Range   Color, UA yellow yellow   Clarity, UA clear clear   Glucose, UA negative negative   Bilirubin, UA negative negative   Ketones, POC UA negative negative   Spec Grav, UA 1.020    Blood, UA moderate (A) negative   pH, UA 5.5    Protein Ur, POC >=300 (A) negative   Urobilinogen, UA 0.2    Nitrite, UA Negative Negative   Leukocytes, UA Negative Negative    EKG/XRAY:     ASSESSMENT/PLAN:  Patient looks great. His chest pain is significantly better. He still has moderate blood on urinalysis with greater than 300 protein. I have ordered a 24-hour urine protein collection. I've also made referrals to nephrology as well as referral to Dr.  Tresa Moore.. No change in treatment at the present time.I personally performed the services described in this documentation, which was scribed in my presence. The recorded information has been  reviewed and is accurate.  Gross sideeffects, risk and benefits, and alternatives of medications d/w patient. Patient is aware that all medications have potential sideeffects and we are unable to predict every sideeffect or drug-drug interaction that [place] occur.  [name] MD 10/29/2015 9:57 AM

## 2015-10-29 NOTE — Patient Instructions (Addendum)
I have placed a referral for you to see Dr. Tresa Moore who did your renal surgery. I would like to collect your urine per 24 hours. I have also made a referral for you to see the kidney specialist regarding the blood in your urine. I am concerned about the amount of protein you aren't losing in your urine.    IF you received an x-ray today, you will receive an invoice from [facility]. Please contact [facility] at [phone] with questions or concerns regarding your invoice.   IF you received labwork today, you will receive an invoice from [employer]. Please contact [facility] at [phone] with questions or concerns regarding your invoice.   Our billing staff will not be able to assist you with questions regarding bills from these companies.  You will be contacted with the lab results as soon as they are available. The fastest way to get your results is to activate your My Chart account. Instructions are located on the last page of this paperwork. If you have not heard from [place] regarding the results in 2 weeks, please contact this office.

## 2015-10-31 ENCOUNTER — Encounter: Payer: Self-pay | Admitting: Emergency Medicine

## 2015-11-01 ENCOUNTER — Other Ambulatory Visit: Payer: Self-pay | Admitting: Emergency Medicine

## 2015-11-01 DIAGNOSIS — N049 Nephrotic syndrome with unspecified morphologic changes: Secondary | ICD-10-CM

## 2015-11-01 LAB — PROTEIN, URINE, 24 HOUR
PROTEIN, URINE: 246 mg/dL — AB (ref 5–25)
Protein, 24H Urine: 5412 mg/24 h — ABNORMAL HIGH (ref <150)

## 2015-11-03 ENCOUNTER — Telehealth: Payer: Self-pay

## 2015-11-03 NOTE — Telephone Encounter (Signed)
Gilbert [name] called. Pt's appt moved up to 4/7 with Dr. Joelyn Oms.  Pt already notified.

## 2015-11-03 NOTE — Telephone Encounter (Signed)
DAUB - Pt says he cant come in Friday because they moved his Urology appointment to then. He will come in Wednesday.  He has questions about one of his referrals also.

## 2015-11-05 ENCOUNTER — Ambulatory Visit (INDEPENDENT_AMBULATORY_CARE_PROVIDER_SITE_OTHER): Payer: BLUE CROSS/BLUE SHIELD | Admitting: Emergency Medicine

## 2015-11-05 VITALS — BP 144/76 | HR 68 | Temp 97.8°F | Resp 16 | Ht 70.0 in | Wt 163.0 lb

## 2015-11-05 DIAGNOSIS — R809 Proteinuria, unspecified: Secondary | ICD-10-CM | POA: Diagnosis not present

## 2015-11-05 LAB — POCT URINALYSIS DIP (MANUAL ENTRY)
BILIRUBIN UA: NEGATIVE
BILIRUBIN UA: NEGATIVE
GLUCOSE UA: NEGATIVE
Leukocytes, UA: NEGATIVE
NITRITE UA: NEGATIVE
PH UA: 6
Protein Ur, POC: >=300 — AB
SPEC GRAV UA: 1.025
Urobilinogen, UA: 0.2

## 2015-11-05 LAB — POC MICROSCOPIC URINALYSIS (UMFC): Mucus: ABSENT

## 2015-11-05 NOTE — Patient Instructions (Signed)
     IF you received an x-ray today, you will receive an invoice from [facility]. Please contact  Radiology at [phone] with questions or concerns regarding your invoice.   IF you received labwork today, you will receive an invoice from [facility]. Please contact [facility] at [phone] with questions or concerns regarding your invoice.   Our billing staff will not be able to assist you with questions regarding bills from these companies.  You will be contacted with the lab results as soon as they are available. The fastest way to get your results is to activate your My Chart account. Instructions are located on the last page of this paperwork. If you have not heard from us regarding the results in 2 weeks, please contact this office.      

## 2015-11-05 NOTE — Progress Notes (Signed)
Patient ID: Gilbert [name], male   DOB: [birth year], 65 y.o.   MRN: [id number]    By signing my name below, I, Gilbert [name], attest that this documentation has been prepared under the direction and in the presence of Gilbert Russian, MD. Electronically Signed: Ladene [name], ED Scribe 11/05/2015 at 10:54 AM.   Chief Complaint:  Chief Complaint  Patient presents with  . Follow-up    need lab for urine     HPI: Gilbert [name] is a 65 y.o. male who reports to [facility] today for urinalysis due to abnormal kidney functions. Pt's prior labs from 2 weeks ago have shown that his proteins are leaking into his urine from his kidneys. Pt's 24hr urine protein showed 5412mg .  Pt states he has continued taking aleve despite being advised against taking them.  Pt has had a right kidney removal due to renal cell cancer. Pt is followed by an oncologist. Pt has an appointment with his nephrologist in 2 days and an appointment with his urologist in 2 weeks.   Pt has a Golf tournament coming up in 3 days.     Past Medical History  Diagnosis Date  . Gout   . GERD (gastroesophageal reflux disease)   . Hypertension   . Hypercholesteremia   . Shortness of breath     with activity  . History of epilepsy     at least 10 years ago. 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 Family history of anesthesia complication     mother-had stroke during anesthesia  . HTN (hypertension)   . Seizures ([place])   . History of myocardial infarction less than 8 weeks     Stent placed  . CAD (coronary artery disease)   . PAD (peripheral artery disease) ([place])   . Hyperlipemia   . Therapeutic drug monitoring   . Coronary artery calcification seen on CAT scan   . SOB (shortness of breath)   . History of tobacco use   . Systolic and diastolic CHF, chronic (HCC)     Resolved. EF 45% 04/10/2014  . Essential hypertension, benign   . History of nephrectomy 11/2013    For renal cell carcinoma  . Acid reflux   . Abnormal liver enzymes    Chronic alkaline phosphatase elevation since 2014  . History of cardiac arrest 11/26/13    PTCA/Stenting of LM & Prox LAD with 4.0 x 18 mm Xience alpine stent. Used Impella Circulatory assist device. EF= 20-25%  . Coronary atherosclerosis of native coronary artery   . Cancer ([place]) 08/2013    right kidney cancer  . Cancer [facility])    Past Surgical History  Procedure Laterality Date  . No past surgeries    . Eye surgery Left 2 weeks ago    torn retina  . Robot assisted laparoscopic nephrectomy Right 10/19/2013    Procedure: ROBOTIC ASSISTED LAPAROSCOPIC NEPHRECTOMY,  EXTENSIVE ADHESIOLYSIS;  Surgeon: Alexis Frock, MD;  Location: [facility];  Service: Urology;  Laterality: Right;  . Coronary angioplasty with stent placement  2015    Left main coronary artery stenting on emergent basis along with circulatory support with Impella device through left leg. With 4.0 x 18 mm Xience alpine stent  . Nephrectomy  11/2013  . Iliac artery stent Left 05/28/2014    dr Einar Gip  . Left heart catheterization with coronary angiogram N/A 11/27/2013    Procedure: LEFT HEART CATHETERIZATION WITH CORONARY ANGIOGRAM;  Surgeon: [name], MD;  Location: [facility] CATH LAB;  Service: Cardiovascular;  Laterality: N/A;  . Percutaneous coronary stent intervention (pci-s)  11/27/2013    Procedure: PERCUTANEOUS CORONARY STENT INTERVENTION (PCI-S);  Surgeon: [name], MD;  Location: [facility] CATH LAB;  Service: Cardiovascular;;  . Lower extremity angiogram N/A 02/26/2014    Procedure: LOWER EXTREMITY ANGIOGRAM;  Surgeon: [name], MD;  Location: [facility] CATH LAB;  Service: Cardiovascular;  Laterality: N/A;  . Lower extremity angiogram N/A 05/28/2014    Procedure: LOWER EXTREMITY ANGIOGRAM;  Surgeon: [name], MD;  Location: [facility] CATH LAB;  Service: Cardiovascular;  Laterality: N/A;  . Esophagogastroduodenoscopy    . Esophagogastroduodenoscopy N/A 01/21/2015    Procedure: ESOPHAGOGASTRODUODENOSCOPY (EGD) with possible  dilation.;  Surgeon: Gatha Mayer, MD;  Location: [facility] ENDOSCOPY;  Service: Endoscopy;  Laterality: N/A;  . Balloon dilation N/A 01/21/2015    Procedure: BALLOON DILATION;  Surgeon: Gatha Mayer, MD;  Location: [facility] ENDOSCOPY;  Service: Endoscopy;  Laterality: N/A;  . Cardiac catheterization N/A 06/10/2015    Procedure: Left Heart Cath and Coronary Angiography;  Surgeon: Adrian Prows, MD;  Location: [place] CV LAB;  Service: Cardiovascular;  Laterality: N/A;   Social History   Social History  . Marital Status: Single    Spouse Name: N/A  . Number of Children: N/A  . Years of Education: N/A   Social History Main Topics  . Smoking status: Former Smoker -- 2.00 packs/day for 45 years    Types: Cigarettes    Quit date: 08/03/2003  . Smokeless tobacco: Current User    Types: Chew     Comment: quit  smoking 10 years agop  . Alcohol Use: No     Comment: quit 1980  . Drug Use: No     Comment: none in past 30 years   . Sexual Activity: Not Asked   Other Topics Concern  . None   Social History Narrative   ** Merged History Encounter **       Family History  Problem Relation Age of Onset  . Stroke Mother   . Heart disease Brother     multiple stents  . Hyperlipidemia Brother   . Emphysema Father    Allergies  Allergen Reactions  . Oxycodone Swelling and Other (See Comments)    Tongue and lip   Prior to Admission medications   Medication Sig Start Date End Date Taking? Authorizing Provider  amLODipine (NORVASC) 10 MG tablet Take 10 mg by mouth daily.   Yes Historical Provider, MD  aspirin EC 81 MG tablet Take 1 tablet (81 mg total) by mouth daily. Patient taking differently: Take 81 mg by mouth every morning.  12/11/13  Yes Daniel J Angiulli, PA-C  atorvastatin (LIPITOR) 80 MG tablet Take 80 mg by mouth daily. 09/05/15  Yes Historical Provider, MD  carvedilol (COREG) 12.5 MG tablet Take 12.5 mg by mouth 2 (two) times daily with a meal.  11/13/14  Yes Historical Provider, MD    Cyanocobalamin (B-12) 5000 MCG CAPS Take 5,000 mcg by mouth every morning.    Yes Historical Provider, MD  cyclobenzaprine (FLEXERIL) 10 MG tablet Take 1 tablet (10 mg total) by mouth 3 (three) times daily as needed for muscle spasms. Patient taking differently: Take 5 mg by mouth 3 (three) times daily as needed for muscle spasms.  10/23/15  Yes Veryl Speak, MD  EFFIENT 10 MG TABS tablet Take 10 mg by mouth daily. 09/30/15  Yes Historical Provider, MD  pantoprazole (PROTONIX) 40 MG tablet Take 40 mg by mouth every morning.  11/08/14  Yes Historical Provider, MD  PHENYTEK 300 MG ER capsule Take 1 capsule (300 mg total) by mouth every morning. 09/06/15  Yes Gilbert Russian, MD  polyethylene glycol (MIRALAX / GLYCOLAX) packet Take 17 g by mouth daily.   Yes Historical Provider, MD  Psyllium (METAMUCIL PO) Take 3 capsules by mouth every evening. Reported on 10/24/2015   Yes Historical Provider, MD  pyridOXINE (VITAMIN B-6) 100 MG tablet Take 100 mg by mouth every morning.    Yes Historical Provider, MD  Thiamine HCl (VITAMIN B-1) 250 MG tablet Take 250 mg by mouth every morning.    Yes Historical Provider, MD  traMADol (ULTRAM) 50 MG tablet Take 50 mg by mouth every 6 (six) hours as needed for moderate pain or severe pain.    Yes Historical Provider, MD  valsartan (DIOVAN) 160 MG tablet Take 160 mg by mouth every morning. Reported on 11/05/2015 11/08/14   Historical Provider, MD     ROS: The patient denies fevers, chills, night sweats, unintentional weight loss, chest pain, palpitations, wheezing, dyspnea on exertion, nausea, vomiting, abdominal pain, dysuria, hematuria, melena, numbness, weakness, or tingling.  All other systems have been reviewed and were otherwise negative with the exception of those mentioned in the HPI and as above.    PHYSICAL EXAM: Filed Vitals:   11/05/15 1005  BP: 144/76  Pulse: 68  Temp: 97.8 F (36.6 C)  Resp: 16   Body mass index is 23.39 kg/(m^2).   General: Alert, no  acute distress HEENT:  Normocephalic, atraumatic, oropharynx patent. Eye: Juliette Mangle [facility] Cardiovascular:  Regular rate and rhythm, no rubs murmurs or gallops.  No Carotid bruits, radial pulse intact. No pedal edema.  Respiratory: Clear to auscultation bilaterally.  No wheezes, rales, or rhonchi.  No cyanosis, no use of accessory musculature Abdominal: No organomegaly, abdomen is soft and non-tender, positive bowel sounds.  No masses. Musculoskeletal: Gait intact. No edema, tenderness Skin: No rashes. Neurologic: Facial musculature symmetric. Psychiatric: Patient acts appropriately throughout our interaction. Lymphatic: No cervical or submandibular lymphadenopathy   LABS: Results for orders placed or performed in visit on 11/05/15  POCT urinalysis dipstick  Result Value Ref Range   Color, UA yellow yellow   Clarity, UA clear clear   Glucose, UA negative negative   Bilirubin, UA negative negative   Ketones, POC UA negative negative   Spec Grav, UA 1.025    Blood, UA moderate (A) negative   pH, UA 6.0    Protein Ur, POC >=300 (A) negative   Urobilinogen, UA 0.2    Nitrite, UA Negative Negative   Leukocytes, UA Negative Negative  POCT Microscopic Urinalysis ([facility])  Result Value Ref Range   WBC,UR,HPF,POC Few (A) None WBC/hpf   RBC,UR,HPF,POC Many (A) None RBC/hpf   Bacteria None None, Too numerous to count   Mucus Absent Absent   Epithelial Cells, UR Per Microscopy Few (A) None, Too numerous to count cells/hpf      EKG/XRAY:   Primary read interpreted by Dr. Everlene Farrier at [facility].   ASSESSMENT/PLAN:  Patient has appointment with urology and nephrology he persisted in having significant proteinuria. His valsartan is currently on hold but I'm sure will be restarted after nephrology visit. He was again instructed to avoid all nonsteroidals. I personally performed the services described in this documentation, which was scribed in my presence. The recorded information has been reviewed and  is accurate.   Gross sideeffects, risk and benefits, and alternatives of medications d/w patient. Patient is aware  that all medications have potential sideeffects and we are unable to predict every sideeffect or drug-drug interaction that [place] occur.  [name] MD 11/05/2015 10:23 AM

## 2015-11-07 DIAGNOSIS — I10 Essential (primary) hypertension: Secondary | ICD-10-CM | POA: Diagnosis not present

## 2015-11-07 DIAGNOSIS — N183 Chronic kidney disease, stage 3 (moderate): Secondary | ICD-10-CM | POA: Diagnosis not present

## 2015-11-07 DIAGNOSIS — R809 Proteinuria, unspecified: Secondary | ICD-10-CM | POA: Diagnosis not present

## 2015-12-08 DIAGNOSIS — I10 Essential (primary) hypertension: Secondary | ICD-10-CM | POA: Diagnosis not present

## 2015-12-08 DIAGNOSIS — N183 Chronic kidney disease, stage 3 (moderate): Secondary | ICD-10-CM | POA: Diagnosis not present

## 2015-12-10 ENCOUNTER — Ambulatory Visit (HOSPITAL_BASED_OUTPATIENT_CLINIC_OR_DEPARTMENT_OTHER)
Admission: RE | Admit: 2015-12-10 | Discharge: 2015-12-10 | Disposition: A | Discharge status: Home / Self Care | Payer: Medicare Other | Source: Ambulatory Visit | Attending: Hematology & Oncology | Admitting: Hematology & Oncology

## 2015-12-10 ENCOUNTER — Other Ambulatory Visit (HOSPITAL_BASED_OUTPATIENT_CLINIC_OR_DEPARTMENT_OTHER): Payer: BLUE CROSS/BLUE SHIELD

## 2015-12-10 ENCOUNTER — Encounter: Payer: Self-pay | Admitting: Hematology & Oncology

## 2015-12-10 ENCOUNTER — Other Ambulatory Visit (HOSPITAL_BASED_OUTPATIENT_CLINIC_OR_DEPARTMENT_OTHER): Payer: Medicare Other

## 2015-12-10 ENCOUNTER — Ambulatory Visit (HOSPITAL_BASED_OUTPATIENT_CLINIC_OR_DEPARTMENT_OTHER): Payer: BLUE CROSS/BLUE SHIELD | Admitting: Hematology & Oncology

## 2015-12-10 ENCOUNTER — Encounter (HOSPITAL_BASED_OUTPATIENT_CLINIC_OR_DEPARTMENT_OTHER): Payer: Self-pay

## 2015-12-10 VITALS — BP 133/59 | HR 59 | Temp 97.6°F | Resp 18 | Ht 70.0 in | Wt 159.0 lb

## 2015-12-10 DIAGNOSIS — R590 Localized enlarged lymph nodes: Secondary | ICD-10-CM | POA: Insufficient documentation

## 2015-12-10 DIAGNOSIS — I2510 Atherosclerotic heart disease of native coronary artery without angina pectoris: Secondary | ICD-10-CM | POA: Insufficient documentation

## 2015-12-10 DIAGNOSIS — R918 Other nonspecific abnormal finding of lung field: Secondary | ICD-10-CM | POA: Diagnosis not present

## 2015-12-10 DIAGNOSIS — M898X9 Other specified disorders of bone, unspecified site: Secondary | ICD-10-CM

## 2015-12-10 DIAGNOSIS — M899 Disorder of bone, unspecified: Secondary | ICD-10-CM | POA: Diagnosis not present

## 2015-12-10 DIAGNOSIS — C641 Malignant neoplasm of right kidney, except renal pelvis: Secondary | ICD-10-CM | POA: Insufficient documentation

## 2015-12-10 DIAGNOSIS — J432 Centrilobular emphysema: Secondary | ICD-10-CM | POA: Insufficient documentation

## 2015-12-10 DIAGNOSIS — M4854XA Collapsed vertebra, not elsewhere classified, thoracic region, initial encounter for fracture: Secondary | ICD-10-CM | POA: Diagnosis not present

## 2015-12-10 LAB — CMP (CANCER CENTER ONLY)
ALK PHOS: 160 U/L — AB (ref 26–84)
ALT: 17 U/L (ref 10–47)
AST: 20 U/L (ref 11–38)
Albumin: 2.6 g/dL — ABNORMAL LOW (ref 3.3–5.5)
BUN: 17 mg/dL (ref 7–22)
CO2: 27 mEq/L (ref 18–33)
CREATININE: 1.4 mg/dL — AB (ref 0.6–1.2)
Calcium: 9.7 mg/dL (ref 8.0–10.3)
Chloride: 103 mEq/L (ref 98–108)
GLUCOSE: 105 mg/dL (ref 73–118)
POTASSIUM: 3.9 meq/L (ref 3.3–4.7)
SODIUM: 137 meq/L (ref 128–145)
TOTAL PROTEIN: 7.1 g/dL (ref 6.4–8.1)
Total Bilirubin: 0.6 mg/dl (ref 0.20–1.60)

## 2015-12-10 LAB — CBC WITH DIFFERENTIAL (CANCER CENTER ONLY)
BASO#: 0 10*3/uL (ref 0.0–0.2)
BASO%: 0.2 % (ref 0.0–2.0)
EOS ABS: 0.4 10*3/uL (ref 0.0–0.5)
EOS%: 4.4 % (ref 0.0–7.0)
HCT: 32.4 % — ABNORMAL LOW (ref 38.7–49.9)
HGB: 11.1 g/dL — ABNORMAL LOW (ref 13.0–17.1)
LYMPH#: 1.2 10*3/uL (ref 0.9–3.3)
LYMPH%: 12 % — AB (ref 14.0–48.0)
MCH: 28.8 pg (ref 28.0–33.4)
MCHC: 34.3 g/dL (ref 32.0–35.9)
MCV: 84 fL (ref 82–98)
MONO#: 0.7 10*3/uL (ref 0.1–0.9)
MONO%: 7.2 % (ref 0.0–13.0)
NEUT#: 7.3 10*3/uL — ABNORMAL HIGH (ref 1.5–6.5)
NEUT%: 76.2 % (ref 40.0–80.0)
PLATELETS: 205 10*3/uL (ref 145–400)
RBC: 3.86 10*6/uL — AB (ref 4.20–5.70)
RDW: 13.8 % (ref 11.1–15.7)
WBC: 9.6 10*3/uL (ref 4.0–10.0)

## 2015-12-10 MED ORDER — IOPAMIDOL (ISOVUE-300) INJECTION 61%
80.0000 mL | Freq: Once | INTRAVENOUS | Status: AC | PRN
  Administered 2015-12-10: 80 mL via INTRAVENOUS

## 2015-12-10 NOTE — Progress Notes (Signed)
Hematology and Oncology Follow Up Visit  Gilbert [name] [id number] 08/23/1950 65 y.o. 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 He is doing pretty well.    unfortunately, it now looks like he has metastatic disease. We did go ahead and get scans on him. These were just part of routine follow-up. The scans show that he had new pulmonary nodules. The largest measured 1.7 x 1.4 cm in the left lower lobe. He has stable  Right hilar lymph node measuring 1.9 cm. He does have lytic and sclerotic lesion in the right posterior sixth rib. This is unchanged. He has a T11 vertebral compression fracture which is new. Also noted is a small lytic lesion in the T2 vertebral body.  He had a VQ scan done back in March which was negative for pulmonary embolism.   he's lost 10 pounds since we last saw him. His appetite is down a little bit. He's had no nausea or vomiting.  He has had no cough. There is no hemoptysis. He's had no chest wall pain.  He had his heart attack 2 years ago. So far, this has been fairly stable.   He's had no headache. There's no visual changes.   He's had no change in bowel or bladder habits.   He's had no leg swelling. There's been no joint issues. He's had some chronic issues with his back but nothing new.   Overall, his performance status is ECOG 1.  Medications:  Current outpatient prescriptions:  .  amLODipine (NORVASC) 10 MG tablet, Take 10 mg by mouth daily., Disp: , Rfl:  .  aspirin EC 81 MG tablet, Take 1 tablet (81 mg total) by mouth daily. (Patient taking differently: Take 81 mg by mouth every morning. ), Disp: , Rfl:  .  atorvastatin (LIPITOR) 80 MG tablet, Take 80 mg by mouth daily., Disp: , Rfl: 3 .  carvedilol (COREG) 12.5 MG tablet, Take 12.5 mg by mouth 2 (two) times daily with a meal. , Disp: , Rfl:  .  clopidogrel (PLAVIX)  75 MG tablet, , Disp: , Rfl: 0 .  Cyanocobalamin (B-12) 5000 MCG CAPS, Take 5,000 mcg by mouth every morning. , Disp: , Rfl:  .  cyclobenzaprine (FLEXERIL) 10 MG tablet, Take 1 tablet (10 mg total) by mouth 3 (three) times daily as needed for muscle spasms. (Patient taking differently: Take 5 mg by mouth 3 (three) times daily as needed for muscle spasms. ), Disp: 20 tablet, Rfl: 0 .  PHENYTEK 300 MG ER capsule, Take 1 capsule (300 mg total) by mouth every morning., Disp: 30 capsule, Rfl: 11 .  polyethylene glycol (MIRALAX / GLYCOLAX) packet, Take 17 g by mouth daily., Disp: , Rfl:  .  Psyllium (METAMUCIL PO), Take 3 capsules by mouth every evening. Reported on 10/24/2015, Disp: , Rfl:  .  pyridOXINE (VITAMIN B-6) 100 MG tablet, Take 100 mg by mouth every morning. , Disp: , Rfl:  .  Thiamine HCl (VITAMIN B-1) 250 MG tablet, Take 250 mg by mouth every morning. , Disp: , Rfl:  .  traMADol (ULTRAM) 50 MG tablet, Take 50 mg by mouth every 6 (six) hours as needed for moderate pain or severe pain. , Disp: , Rfl:  .  valsartan (DIOVAN) 160 MG tablet, Take 160 mg by mouth every morning. Reported on 11/05/2015, Disp: , Rfl:  Allergies:  Allergies  Allergen Reactions  . Oxycodone Swelling and Other (See Comments)    Tongue and lip    Past Medical History, Surgical history, Social history, and Family History were reviewed and updated.  Review of Systems: As above  Physical Exam:  height is 5\' 10"  (1.778 m) and weight is 159 lb (72.122 kg). His oral temperature is 97.6 F (36.4 C). His blood pressure is 133/59 and his pulse is 59. His respiration is 18.   Well-developed and well-nourished white male. Head and neck exam shows no ocular or oral lesions. He has no adenopathy in the neck. Lungs are clear. Cardiac exam regular rate and rhythm with no murmurs rubs or bruits. Abdomen is soft. He has laparoscopy scars that are well-healed from his right nephrectomy. There is no fluid wave. There is no palpable  liver or spleen tip. Back exam shows no tenderness over the spine ribs or hips. Extremities shows no clubbing cyanosis or edema. Skin exam no rashes, ecchymoses or petechia. Neurological exam is nonfocal.  Lab Results  Component Value Date   WBC 9.6 12/10/2015   HGB 11.1* 12/10/2015   HCT 32.4* 12/10/2015   MCV 84 12/10/2015   PLT 205 12/10/2015     Chemistry      Component Value Date/Time   NA 137 12/10/2015 0748   NA 137 10/24/2015 1835   K 3.9 12/10/2015 0748   K 3.7 10/24/2015 1835   CL 103 12/10/2015 0748   CL 103 10/24/2015 1835   CO2 27 12/10/2015 0748   CO2 25 10/24/2015 1835   BUN 17 12/10/2015 0748   BUN 19 10/24/2015 1835   CREATININE 1.4* 12/10/2015 0748   CREATININE 1.49* 10/24/2015 1835      Component Value Date/Time   CALCIUM 9.7 12/10/2015 0748   CALCIUM 8.4* 10/24/2015 1835   ALKPHOS 160* 12/10/2015 0748   ALKPHOS 136* 10/23/2015 1256   AST 20 12/10/2015 0748   AST 17 10/23/2015 1256   ALT 17 12/10/2015 0748   ALT 12* 10/23/2015 1256   BILITOT 0.60 12/10/2015 0748   BILITOT 0.8 10/23/2015 1256         Impression and Plan: Gilbert [name] is 65 year old gentleman with stage III clear cell cancer of the right kidney. He underwent laparoscopic nephrectomy in March of 2015.   Unfortunately,  I think he definitely has metastatic disease now. Of course, we have to get a biopsy to prove this. I think it will be very difficult to biopsy one of the lung nodule since nothing really is too peripheral. We Pluta have to think about getting a bone biopsy.  I think a PET scan would be reasonable. The slight shows areas that are active that could be biopsied much more easily than the lung. If, we cannot find anything to biopsy out side of the lung, and I'll see if interventional radiology can help [place] out.   I also want a MRI of his brain. He is asymptomatic but yet, with metastatic kidney cancer, it can definitely go to the brain before any symptoms begin.   I spent about  40-45 minutes with he and his daughter. I did not expect him to have recurrent disease.   If we do document recurrent clear cell carcinoma of the kidney, then I probably would treat him with Votrient which I feel is first-line therapy .   I will try to take care of a lot of the result review over the phone.   We will  try to get him to get a biopsy before have to see him back.   Volanda Napoleon, MD 5/10/201711:30 AM

## 2015-12-17 ENCOUNTER — Ambulatory Visit (HOSPITAL_COMMUNITY)
Admission: RE | Admit: 2015-12-17 | Discharge: 2015-12-17 | Disposition: A | Discharge status: Home / Self Care | Payer: Medicare Other | Source: Ambulatory Visit | Attending: Hematology & Oncology | Admitting: Hematology & Oncology

## 2015-12-17 ENCOUNTER — Encounter: Payer: Self-pay | Admitting: *Deleted

## 2015-12-17 DIAGNOSIS — D320 Benign neoplasm of cerebral meninges: Secondary | ICD-10-CM | POA: Insufficient documentation

## 2015-12-17 DIAGNOSIS — C7951 Secondary malignant neoplasm of bone: Secondary | ICD-10-CM | POA: Insufficient documentation

## 2015-12-17 DIAGNOSIS — C641 Malignant neoplasm of right kidney, except renal pelvis: Secondary | ICD-10-CM | POA: Diagnosis not present

## 2015-12-17 DIAGNOSIS — C779 Secondary and unspecified malignant neoplasm of lymph node, unspecified: Secondary | ICD-10-CM | POA: Diagnosis not present

## 2015-12-17 DIAGNOSIS — R918 Other nonspecific abnormal finding of lung field: Secondary | ICD-10-CM | POA: Insufficient documentation

## 2015-12-17 LAB — GLUCOSE, CAPILLARY: GLUCOSE-CAPILLARY: 91 mg/dL (ref 65–99)

## 2015-12-17 MED ORDER — FLUDEOXYGLUCOSE F - 18 (FDG) INJECTION
7.9800 | Freq: Once | INTRAVENOUS | Status: AC | PRN
  Administered 2015-12-17: 7.98 via INTRAVENOUS

## 2015-12-22 ENCOUNTER — Encounter: Payer: Self-pay | Admitting: Hematology & Oncology

## 2015-12-23 ENCOUNTER — Telehealth: Payer: Self-pay | Admitting: *Deleted

## 2015-12-23 NOTE — Telephone Encounter (Signed)
-----   Message from Volanda Napoleon, MD sent at 12/23/2015  1:30 PM EDT ----- I left a message on his answer machine about the PET scan results. I said that the PET scan shows areas of activity in his lung, bones and lymph nodes. The next step is to get a biopsy to confirm that this is truly kidney cancer that has recurred. I'll try to get hold of interventional radiology to see what they can do for [place].  it might be reasonable to think about pulmonary medicine to do an endoscopic bronchoscopy to see about getting one of the hilar lymph nodes.  We will continue do move quickly through this. Laurey Arrow

## 2015-12-24 DIAGNOSIS — E7800 Pure hypercholesterolemia, unspecified: Secondary | ICD-10-CM | POA: Diagnosis not present

## 2015-12-24 DIAGNOSIS — R0602 Shortness of breath: Secondary | ICD-10-CM | POA: Diagnosis not present

## 2015-12-24 DIAGNOSIS — C799 Secondary malignant neoplasm of unspecified site: Secondary | ICD-10-CM | POA: Diagnosis not present

## 2015-12-24 DIAGNOSIS — I2510 Atherosclerotic heart disease of native coronary artery without angina pectoris: Secondary | ICD-10-CM | POA: Diagnosis not present

## 2015-12-24 NOTE — Addendum Note (Signed)
Addended by: Burney Gauze R on: 12/24/2015 12:03 PM   Modules accepted: Orders

## 2015-12-25 ENCOUNTER — Telehealth (HOSPITAL_COMMUNITY): Payer: Self-pay

## 2015-12-25 ENCOUNTER — Telehealth: Payer: Self-pay | Admitting: *Deleted

## 2015-12-25 NOTE — Telephone Encounter (Signed)
Patient to have CT biopsy on June 2.  Called patient daughter Estill Bamberg to tell her that patient is to stop Plavix 5 days prior to scan and to start back day after.  Patient can stay on Aspirin.

## 2015-12-26 ENCOUNTER — Telehealth: Payer: Self-pay | Admitting: *Deleted

## 2015-12-26 NOTE — Telephone Encounter (Signed)
Spoke with daughter Estill Bamberg regarding medication management and his scheduled biopsy on June 2nd.  Per Dr Marin Olp:  He is to stop his plavix 5 days prior to biopsy and restart the day afterward.  He is to continue taking his aspirin 81mg  daily as ordered.  Daughter Estill Bamberg expressed understanding using teach back.

## 2015-12-31 ENCOUNTER — Other Ambulatory Visit: Payer: Self-pay | Admitting: Radiology

## 2016-01-01 ENCOUNTER — Other Ambulatory Visit: Payer: Self-pay | Admitting: General Surgery

## 2016-01-02 ENCOUNTER — Ambulatory Visit (HOSPITAL_COMMUNITY)
Admission: RE | Admit: 2016-01-02 | Discharge: 2016-01-02 | Disposition: A | Discharge status: Home / Self Care | Payer: Medicare Other | Source: Ambulatory Visit | Attending: Hematology & Oncology | Admitting: Hematology & Oncology

## 2016-01-02 ENCOUNTER — Encounter (HOSPITAL_COMMUNITY): Payer: Self-pay

## 2016-01-02 DIAGNOSIS — Z905 Acquired absence of kidney: Secondary | ICD-10-CM | POA: Insufficient documentation

## 2016-01-02 DIAGNOSIS — I2510 Atherosclerotic heart disease of native coronary artery without angina pectoris: Secondary | ICD-10-CM | POA: Diagnosis not present

## 2016-01-02 DIAGNOSIS — Z7982 Long term (current) use of aspirin: Secondary | ICD-10-CM | POA: Diagnosis not present

## 2016-01-02 DIAGNOSIS — I739 Peripheral vascular disease, unspecified: Secondary | ICD-10-CM | POA: Insufficient documentation

## 2016-01-02 DIAGNOSIS — Z7902 Long term (current) use of antithrombotics/antiplatelets: Secondary | ICD-10-CM | POA: Insufficient documentation

## 2016-01-02 DIAGNOSIS — I5042 Chronic combined systolic (congestive) and diastolic (congestive) heart failure: Secondary | ICD-10-CM | POA: Insufficient documentation

## 2016-01-02 DIAGNOSIS — Z85528 Personal history of other malignant neoplasm of kidney: Secondary | ICD-10-CM | POA: Diagnosis not present

## 2016-01-02 DIAGNOSIS — I110 Hypertensive heart disease with heart failure: Secondary | ICD-10-CM | POA: Insufficient documentation

## 2016-01-02 DIAGNOSIS — Z8249 Family history of ischemic heart disease and other diseases of the circulatory system: Secondary | ICD-10-CM | POA: Diagnosis not present

## 2016-01-02 DIAGNOSIS — I252 Old myocardial infarction: Secondary | ICD-10-CM | POA: Insufficient documentation

## 2016-01-02 DIAGNOSIS — Z79899 Other long term (current) drug therapy: Secondary | ICD-10-CM | POA: Diagnosis not present

## 2016-01-02 DIAGNOSIS — K219 Gastro-esophageal reflux disease without esophagitis: Secondary | ICD-10-CM | POA: Diagnosis not present

## 2016-01-02 DIAGNOSIS — Z885 Allergy status to narcotic agent status: Secondary | ICD-10-CM | POA: Insufficient documentation

## 2016-01-02 DIAGNOSIS — Z8674 Personal history of sudden cardiac arrest: Secondary | ICD-10-CM | POA: Insufficient documentation

## 2016-01-02 DIAGNOSIS — Z87891 Personal history of nicotine dependence: Secondary | ICD-10-CM | POA: Insufficient documentation

## 2016-01-02 DIAGNOSIS — Z825 Family history of asthma and other chronic lower respiratory diseases: Secondary | ICD-10-CM | POA: Diagnosis not present

## 2016-01-02 DIAGNOSIS — M899 Disorder of bone, unspecified: Secondary | ICD-10-CM | POA: Diagnosis not present

## 2016-01-02 DIAGNOSIS — Z823 Family history of stroke: Secondary | ICD-10-CM | POA: Diagnosis not present

## 2016-01-02 DIAGNOSIS — C641 Malignant neoplasm of right kidney, except renal pelvis: Secondary | ICD-10-CM

## 2016-01-02 DIAGNOSIS — E7800 Pure hypercholesterolemia, unspecified: Secondary | ICD-10-CM | POA: Diagnosis not present

## 2016-01-02 DIAGNOSIS — C8339 Diffuse large B-cell lymphoma, extranodal and solid organ sites: Secondary | ICD-10-CM | POA: Insufficient documentation

## 2016-01-02 LAB — BASIC METABOLIC PANEL
Anion gap: 8 (ref 5–15)
BUN: 19 mg/dL (ref 6–20)
CHLORIDE: 105 mmol/L (ref 101–111)
CO2: 25 mmol/L (ref 22–32)
CREATININE: 1.28 mg/dL — AB (ref 0.61–1.24)
Calcium: 8.9 mg/dL (ref 8.9–10.3)
GFR calc Af Amer: >60 mL/min (ref >60)
GFR calc non Af Amer: 57 mL/min — ABNORMAL LOW (ref >60)
Glucose, Bld: 95 mg/dL (ref 65–99)
Potassium: 3.9 mmol/L (ref 3.5–5.1)
SODIUM: 138 mmol/L (ref 135–145)

## 2016-01-02 LAB — CBC WITH DIFFERENTIAL/PLATELET
Basophils Absolute: 0 10*3/uL (ref 0.0–0.1)
Basophils Relative: 0 %
Eosinophils Absolute: 0.4 10*3/uL (ref 0.0–0.7)
Eosinophils Relative: 5 %
HCT: 31.8 % — ABNORMAL LOW (ref 39.0–52.0)
HEMOGLOBIN: 10.6 g/dL — AB (ref 13.0–17.0)
LYMPHS ABS: 1.2 10*3/uL (ref 0.7–4.0)
LYMPHS PCT: 14 %
MCH: 27.8 pg (ref 26.0–34.0)
MCHC: 33.3 g/dL (ref 30.0–36.0)
MCV: 83.5 fL (ref 78.0–100.0)
Monocytes Absolute: 0.5 10*3/uL (ref 0.1–1.0)
Monocytes Relative: 6 %
NEUTROS ABS: 6.1 10*3/uL (ref 1.7–7.7)
NEUTROS PCT: 75 %
Platelets: 185 10*3/uL (ref 150–400)
RBC: 3.81 MIL/uL — AB (ref 4.22–5.81)
RDW: 14.7 % (ref 11.5–15.5)
WBC: 8.2 10*3/uL (ref 4.0–10.5)

## 2016-01-02 LAB — PROTIME-INR
INR: 1.16 (ref 0.00–1.49)
Prothrombin Time: 14.6 seconds (ref 11.6–15.2)

## 2016-01-02 MED ORDER — SODIUM CHLORIDE 0.9 % IV SOLN
INTRAVENOUS | Status: DC
  Administered 2016-01-02: 08:00:00 via INTRAVENOUS

## 2016-01-02 MED ORDER — FENTANYL CITRATE (PF) 100 MCG/2ML IJ SOLN
INTRAMUSCULAR | Status: AC
  Filled 2016-01-02: qty 4

## 2016-01-02 MED ORDER — MIDAZOLAM HCL 2 MG/2ML IJ SOLN
INTRAMUSCULAR | Status: AC
  Filled 2016-01-02: qty 6

## 2016-01-02 MED ORDER — FENTANYL CITRATE (PF) 100 MCG/2ML IJ SOLN
INTRAMUSCULAR | Status: AC | PRN
  Administered 2016-01-02: 50 ug via INTRAVENOUS

## 2016-01-02 MED ORDER — MIDAZOLAM HCL 2 MG/2ML IJ SOLN
INTRAMUSCULAR | Status: AC | PRN
  Administered 2016-01-02 (×2): 1 mg via INTRAVENOUS

## 2016-01-02 NOTE — Discharge Instructions (Signed)
Moderate Conscious Sedation, Adult, Care After °Refer to this sheet in the next few weeks. These instructions provide you with information on caring for yourself after your procedure. Your health care provider Caruth also give you more specific instructions. Your treatment has been planned according to current medical practices, but problems sometimes occur. Call your health care provider if you have any problems or questions after your procedure. °WHAT TO EXPECT AFTER THE PROCEDURE  °After your procedure: °· You Persico feel sleepy, clumsy, and have poor balance for several hours. °· Vomiting Kerchner occur if you eat too soon after the procedure. °HOME CARE INSTRUCTIONS °· Do not participate in any activities where you could become injured for at least 24 hours. Do not: °· Drive. °· Swim. °· Ride a bicycle. °· Operate heavy machinery. °· Cook. °· Use power tools. °· Climb ladders. °· Work from a high place. °· Do not make important decisions or sign legal documents until you are improved. °· If you vomit, drink water, juice, or soup when you can drink without vomiting. Make sure you have little or no nausea before eating solid foods. °· Only take over-the-counter or prescription medicines for pain, discomfort, or fever as directed by your health care provider. °· Make sure you and your family fully understand everything about the medicines given to you, including what side effects Beining occur. °· You should not drink alcohol, take sleeping pills, or take medicines that cause drowsiness for at least 24 hours. °· If you smoke, do not smoke without supervision. °· If you are feeling better, you Tokarz resume normal activities 24 hours after you were sedated. °· Keep all appointments with your health care provider. °SEEK MEDICAL CARE IF: °· Your skin is pale or bluish in color. °· You continue to feel nauseous or vomit. °· Your pain is getting worse and is not helped by medicine. °· You have bleeding or swelling. °· You are still  sleepy or feeling clumsy after 24 hours. °SEEK IMMEDIATE MEDICAL CARE IF: °· You develop a rash. °· You have difficulty breathing. °· You develop any type of allergic problem. °· You have a fever. °MAKE SURE YOU: °· Understand these instructions. °· Will watch your condition. °· Will get help right away if you are not doing well or get worse. °  °This information is not intended to replace advice given to you by your health care provider. Make sure you discuss any questions you have with your health care provider. °  °Document Released: 05/09/2013 Document Revised: 08/09/2014 Document Reviewed: 05/09/2013 °Elsevier Interactive Patient Education ©2016 [employer] °Needle Biopsy of the Bone, Care After °Refer to this sheet in the next few weeks. These instructions provide you with information about caring for yourself after your procedure. Your health care provider Khun also give you more specific instructions. Your treatment has been planned according to current medical practices, but problems sometimes occur. Call your health care provider if you have any problems or questions after your procedure. °WHAT TO EXPECT AFTER THE PROCEDURE  °After your procedure, it is common to have soreness or tenderness at the puncture site. °HOME CARE INSTRUCTIONS °· Take over-the-counter and prescription medicines only as told by your health care provider. °· Bathe and shower as told by your health care provider. °· Follow instructions from your health care provider about: °¨ How to take care of your puncture site. °¨ When and how you should change your bandage (dressing). °¨ When you should remove your dressing. °· Check   your puncture site every day for signs of infection. Watch for: °¨ Redness, swelling, or worsening pain. °¨ Fluid, blood, or pus. °· Return to your normal activities as told by your health care provider. °· Keep all follow-up visits as told by your health care provider. This is important. °SEEK MEDICAL CARE  IF: °· You have redness, swelling, or worsening pain at the site of your puncture. °· You have fluid, blood, or pus coming from your puncture site. °· You have a fever. °· You have persistent nausea or vomiting. °SEEK IMMEDIATE MEDICAL CARE IF: °· You develop a rash. °· You have difficulty breathing. °  °This information is not intended to replace advice given to you by your health care provider. Make sure you discuss any questions you have with your health care provider. °  °Document Released: 02/05/2005 Document Revised: 04/09/2015 Document Reviewed: 08/26/2014 °[employer]vier Interactive Patient Education ©2016 [employer] ° °

## 2016-01-02 NOTE — Consult Note (Signed)
Chief Complaint: Patient was seen in consultation today for CT-guided right iliac bone lesion biopsy  Referring Physician(s): Ennever,Peter R  Supervising Physician: Aletta Edouard  Patient Status: out-pt  History of Present Illness: Gilbert [name] is a 65 y.o. 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 Recent PET scan on 12/17/15 reveals widespread hypermetabolic skeletal metastasis, hypermetabolic left lower lobe nodule, hypermetabolic metastatic adenopathy to the right hilum, hypermetabolic lymph nodes in the porta hepatis and retroperitoneal periaortic nodal stations as well as right supraclavicular lymph node. He presents today for CT-guided right iliac bone lesion biopsy for further evaluation.  Past Medical History  Diagnosis Date  . Gout   . GERD (gastroesophageal reflux disease)   . Hypertension   . Hypercholesteremia   . Shortness of breath     with activity  . History of epilepsy     at least 10 years ago. 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 Family history of anesthesia complication     mother-had stroke during anesthesia  . HTN (hypertension)   . Seizures ([place])   . History of myocardial infarction less than 8 weeks     Stent placed  . CAD (coronary artery disease)   . PAD (peripheral artery disease) ([place])   . Hyperlipemia   . Therapeutic drug monitoring   . Coronary artery calcification seen on CAT scan   . SOB (shortness of breath)   . History of tobacco use   . Systolic and diastolic CHF, chronic (HCC)     Resolved. EF 45% 04/10/2014  . Essential hypertension, benign   . History of nephrectomy 11/2013    For renal cell carcinoma  . Acid reflux   . Abnormal liver enzymes     Chronic alkaline phosphatase elevation since 2014  . History of cardiac arrest 11/26/13    PTCA/Stenting of LM & Prox LAD with 4.0 x 18 mm Xience alpine stent. Used Impella Circulatory assist device. EF= 20-25%  . Coronary  atherosclerosis of native coronary artery   . Cancer ([place]) 08/2013    right kidney cancer  . Cancer [facility])     Past Surgical History  Procedure Laterality Date  . No past surgeries    . Eye surgery Left 2 weeks ago    torn retina  . Robot assisted laparoscopic nephrectomy Right 10/19/2013    Procedure: ROBOTIC ASSISTED LAPAROSCOPIC NEPHRECTOMY,  EXTENSIVE ADHESIOLYSIS;  Surgeon: Alexis Frock, MD;  Location: [facility] ORS;  Service: Urology;  Laterality: Right;  . Coronary angioplasty with stent placement  2015    Left main coronary artery stenting on emergent basis along with circulatory support with Impella device through left leg. With 4.0 x 18 mm Xience alpine stent  . Nephrectomy  11/2013  . Iliac artery stent Left 05/28/2014    dr Einar Gip  . Left heart catheterization with coronary angiogram N/A 11/27/2013    Procedure: LEFT HEART CATHETERIZATION WITH CORONARY ANGIOGRAM;  Surgeon: [name], MD;  Location: [facility] CATH LAB;  Service: Cardiovascular;  Laterality: N/A;  . Percutaneous coronary stent intervention (pci-s)  11/27/2013    Procedure: PERCUTANEOUS CORONARY STENT INTERVENTION (PCI-S);  Surgeon: [name], MD;  Location: [facility] CATH LAB;  Service: Cardiovascular;;  . Lower extremity angiogram N/A 02/26/2014    Procedure: LOWER EXTREMITY ANGIOGRAM;  Surgeon: [name], MD;  Location: [facility] CATH LAB;  Service: Cardiovascular;  Laterality: N/A;  . Lower extremity angiogram N/A 05/28/2014    Procedure: LOWER  EXTREMITY ANGIOGRAM;  Surgeon: [name], MD;  Location: [facility] CATH LAB;  Service: Cardiovascular;  Laterality: N/A;  . Esophagogastroduodenoscopy    . Esophagogastroduodenoscopy N/A 01/21/2015    Procedure: ESOPHAGOGASTRODUODENOSCOPY (EGD) with possible dilation.;  Surgeon: Gatha Mayer, MD;  Location: [facility] ENDOSCOPY;  Service: Endoscopy;  Laterality: N/A;  . Balloon dilation N/A 01/21/2015    Procedure: BALLOON DILATION;  Surgeon: Gatha Mayer, MD;  Location: [facility]  ENDOSCOPY;  Service: Endoscopy;  Laterality: N/A;  . Cardiac catheterization N/A 06/10/2015    Procedure: Left Heart Cath and Coronary Angiography;  Surgeon: Adrian Prows, MD;  Location: [place] CV LAB;  Service: Cardiovascular;  Laterality: N/A;    Allergies: Oxycodone  Medications: Prior to Admission medications   Medication Sig Start Date End Date Taking? Authorizing Provider  amLODipine (NORVASC) 10 MG tablet Take 10 mg by mouth daily.   Yes Historical Provider, MD  aspirin EC 81 MG tablet Take 1 tablet (81 mg total) by mouth daily. Patient taking differently: Take 81 mg by mouth every morning.  12/11/13  Yes Daniel J Angiulli, PA-C  atorvastatin (LIPITOR) 80 MG tablet Take 80 mg by mouth daily. 09/05/15  Yes Historical Provider, MD  carvedilol (COREG) 12.5 MG tablet Take 12.5 mg by mouth 2 (two) times daily with a meal.  11/13/14  Yes Historical Provider, MD  Cyanocobalamin (B-12) 5000 MCG CAPS Take 5,000 mcg by mouth every morning.    Yes Historical Provider, MD  PHENYTEK 300 MG ER capsule Take 1 capsule (300 mg total) by mouth every morning. 09/06/15  Yes [name], MD  Psyllium (METAMUCIL PO) Take 3 capsules by mouth every evening. Reported on 10/24/2015   Yes Historical Provider, MD  pyridOXINE (VITAMIN B-6) 100 MG tablet Take 100 mg by mouth every morning.    Yes Historical Provider, MD  Thiamine HCl (VITAMIN B-1) 250 MG tablet Take 250 mg by mouth every morning.    Yes Historical Provider, MD  valsartan (DIOVAN) 160 MG tablet Take 160 mg by mouth every morning. Reported on 11/05/2015 11/08/14  Yes Historical Provider, MD  clopidogrel (PLAVIX) 75 MG tablet  10/31/15   Historical Provider, MD  cyclobenzaprine (FLEXERIL) 10 MG tablet Take 1 tablet (10 mg total) by mouth 3 (three) times daily as needed for muscle spasms. Patient taking differently: Take 5 mg by mouth 3 (three) times daily as needed for muscle spasms.  10/23/15   Veryl Speak, MD  polyethylene glycol (MIRALAX / GLYCOLAX) packet  Take 17 g by mouth daily.    Historical Provider, MD  traMADol (ULTRAM) 50 MG tablet Take 50 mg by mouth every 6 (six) hours as needed for moderate pain or severe pain.     Historical Provider, MD     Family History  Problem Relation Age of Onset  . Stroke Mother   . Heart disease Brother     multiple stents  . Hyperlipidemia Brother   . Emphysema Father     Social History   Social History  . Marital Status: Single    Spouse Name: N/A  . Number of Children: N/A  . Years of Education: N/A   Social History Main Topics  . Smoking status: Former Smoker -- 2.00 packs/day for 45 years    Types: Cigarettes    Quit date: 08/03/2003  . Smokeless tobacco: Current User    Types: Chew     Comment: quit  smoking 10 years agop  . Alcohol Use: No  Comment: quit 1980  . Drug Use: No     Comment: none in past 30 years   . Sexual Activity: Not Asked   Other Topics Concern  . None   Social History Narrative   ** Merged History Encounter **          Review of Systems patient currently denies fever, headache, chest pain, back pain, or abnormal bleeding. He does have occasional dyspnea with exertion, occasional cough, intermittent mild generalized abdominal discomfort, intermittent nausea/vomiting.  Vital Signs: Temperature 97.6, heart rate 69, respirations 16, O2 sat 98% room air  Filed Vitals:   01/02/16 0715  BP: 182/73     Physical Exam patient awake, alert. Flat affect;  Chest clear to auscultation bilat; heart with regular rate and rhythm. Abdomen soft, positive bowel sounds, mild generalized tenderness to palpation. Well-healed right nephrectomy scar. Lower extremities with no edema.  Mallampati Score:     Imaging: Ct Chest W Contrast  12/10/2015  CLINICAL DATA:  Right renal cancer. Follow-up right hilar adenopathy. EXAM: CT CHEST WITH CONTRAST TECHNIQUE: Multidetector CT imaging of the chest was performed during intravenous contrast administration. CONTRAST:  48mL  ISOVUE-300 IOPAMIDOL (ISOVUE-300) INJECTION 61% COMPARISON:  09/10/2015 CT chest. FINDINGS: Mediastinum/Nodes: Normal heart size. No pericardial fluid/thickening. Left main, left anterior descending, left circumflex and right coronary atherosclerosis. Atherosclerotic nonaneurysmal thoracic aorta. Normal caliber pulmonary arteries. No central pulmonary emboli. Suggestion of a stable hypodense 1.0 cm right thyroid lobe nodule. Normal esophagus. No axillary adenopathy. Moderately enlarged 1.9 cm short axis right hilar node (series 2/ image 79) is stable since 09/10/2015. No additional pathologically enlarged mediastinal or hilar nodes. Lungs/Pleura: No pneumothorax. No pleural effusion. There are at least 7 new irregular pulmonary nodules in both lungs, predominantly in the lower lobes, for example a 1.3 x 1.0 cm irregular medial right lower lobe pulmonary nodule (series 3/ image 122) and a 1.7 x 1.4 cm irregular left lower lobe pulmonary nodule (series 3/ image 128). Previously described 4 mm anterior left lower lobe pulmonary nodule is stable (series 3/ image 128). Mild centrilobular emphysema. Upper abdomen: Unremarkable. Musculoskeletal: There is a healing subacute nondisplaced right lateral fifth rib fracture. There is a mixed lytic and sclerotic lesion in the right posterior lateral sixth rib, not appreciably changed. There is a predominantly sclerotic lesion in the left glenoid, not appreciably changed. There is a mild superior T11 vertebral compression fracture, new since 10/21/2015 CT abdomen/ pelvis study. Small lytic lesion in the anterior T2 vertebral body is stable. IMPRESSION: 1. Several (at least 7) new irregular pulmonary nodules in both lungs, predominantly in the lower lobes, suspicious for pulmonary metastases. 2. Stable moderate right hilar adenopathy. 3. Stable sclerotic left glenoid, mixed lytic and sclerotic right sixth rib and lytic T2 osseous lesions, bone metastases not excluded. 4. Mild  superior T11 vertebral compression fracture, new since 10/21/2015, either acute or subacute. 5. Healing subacute nondisplaced right lateral fifth rib fracture. 6. Left main and 3 vessel coronary atherosclerosis. 7. Mild centrilobular emphysema. These results will be called to the ordering clinician or representative by the Radiologist Assistant, and communication documented in the PACS or zVision Dashboard. Electronically Signed   By: Ilona Sorrel M.D.   On: 12/10/2015 10:16   Mr Jeri Cos [id number] Contrast  12/17/2015  CLINICAL DATA:  64 year old male with right side renal cancer, recently discovered pulmonary metastases. Subsequent encounter. EXAM: MRI HEAD WITHOUT AND WITH CONTRAST TECHNIQUE: Multiplanar, multiecho pulse sequences of the brain and surrounding structures were obtained without  and with intravenous contrast. CONTRAST:  15 mL MultiHance COMPARISON:  Head CT without contrast 11/27/2013. Brain MRI without and with contrast 06/24/2003. FINDINGS: Major intracranial vascular flow voids are stable with dominant and dolichoectatic distal left vertebral artery. Cerebral volume is normal for age and has not significantly changed since 2004. No restricted diffusion to suggest acute infarction. No midline shift, ventriculomegaly, extra-axial collection or acute intracranial hemorrhage. Cervicomedullary junction and pituitary are within normal limits. There is a smooth extra-axial dural based mass along the anterior interhemispheric fissure measuring 22 x 14 x 21 mm (AP by transverse by CC). This was 8-9 mm and subtle in 2004. Associated smooth dural thickening in the adjacent interhemispheric fissure. Mild mass effect on the adjacent anterior left frontal lobe without cerebral edema (series 7, image 17). No other abnormal intracranial enhancement. No other intracranial mass. No other dural thickening. Mild for age scattered small cerebral white matter T2 and FLAIR hyperintense foci are nonspecific and mildly  progressed. No cortical encephalomalacia. No chronic cerebral blood products. Negative visualized cervical spinal cord. Visible internal auditory structures appear normal. Mastoids are clear. Paranasal sinuses are clear. Negative orbit and scalp soft tissues. Visible bone marrow signal is stable since 2004 and within normal limits. IMPRESSION: 1.  No acute or metastatic intracranial abnormality. 2. Chronic anterior left parafalcine meningioma, 2.2 cm. Mild mass effect on the adjacent anterior left frontal lobe without cerebral edema. Favor this lesion is clinically silent. Electronically Signed   By: Genevie Ann M.D.   On: 12/17/2015 09:18   Nm Pet Image Initial (pi) Skull Base To Thigh  12/17/2015  CLINICAL DATA:  Initial treatment strategy for RIGHT renal cell carcinoma. EXAM: NUCLEAR MEDICINE PET SKULL BASE TO THIGH TECHNIQUE: 7.9 mCi F-18 FDG was injected intravenously. Full-ring PET imaging was performed from the skull base to thigh after the radiotracer. CT data was obtained and used for attenuation correction and anatomic localization. FASTING BLOOD GLUCOSE:  Value: 91 mg/dl COMPARISON:  CT 12/10/2015, bone scan 08/24/2013 FINDINGS: . NECK Small hypermetabolic LEFT level 5 lymph node on the RIGHT (image 41 fused data set) and RIGHT supraclavicular node. CHEST There is intense metabolic activity in RIGHT hilum corresponding to thickened hilar lymph nodes on comparison diagnostic CT. Hilar activity is intense not intensely metabolic with SUV max 0000000. Within the inferior LEFT and RIGHT lung lower lobes there are pulmonary nodules. The largest nodule in the LEFT lower lobe measures 12 mm and has associated metabolic activity with SUV max equal 3.8. Other similar nodules within LEFT RIGHT lung base consistent metastatic disease. ABDOMEN/PELVIS No abnormal metabolic activity the liver. Hypermetabolic focus in the porta hepatis (image 116) is poorly defined by CT. Hypermetabolic adenopathy posterior to the IVC  measuring 15 mm strokes with intense metabolic activity SUV max 11.9. Periaortic lymph node measuring 16 mm LEFT of the aorta. SKELETON There is widespread intensely hypermetabolic skeletal metastasis. These metastatic lesions difficult to see on the CT portion the most part. Example in the posterior RIGHT iliac bone large lesion with SUV max equal 15.4 is not seen on the CT. Intense metabolic activity in the RIGHT scapula is associated sclerosis (SUV max 16.2. Additional lesions cervical spine the vascular lumbar spine. IMPRESSION: 1. Widespread hypermetabolic skeletal metastasis. These lesions are much more prominent on the PET data set of the [place]. 2. Hypermetabolic activity in the largest LEFT lower lobe nodule. Other similar nodules are favored metastatic disease. 3. Hypermetabolic metastatic adenopathy to the RIGHT hilum. 4. Hypermetabolic lymph  nodes in the porta hepatis and retroperitoneal periaortic aortic nodal stations. 5. Hypermetabolic RIGHT level 5 neck lymph node and RIGHT supraclavicular node. Electronically Signed   By: Suzy Bouchard M.D.   On: 12/17/2015 13:42    Labs:  CBC:  Recent Labs  10/23/15 1256 10/24/15 1813 12/10/15 0748 01/02/16 0732  WBC 9.5 10.5 9.6 8.2  HGB 10.5* 9.7* 11.1* 10.6*  HCT 30.8* 27.4* 32.4* 31.8*  PLT 177 237 205 185    COAGS:  Recent Labs  01/02/16 0732  INR 1.16    BMP:  Recent Labs  10/21/15 0948 10/23/15 1256 10/24/15 1835 12/10/15 0748 01/02/16 0732  NA 138 134* 137 137 138  K 4.2 4.7 3.7 3.9 3.9  CL 104 101 103 103 105  CO2 24 25 25 27 25   GLUCOSE 89 101* 84 105 95  BUN 18 15 19 17 19   CALCIUM 8.4* 8.8* 8.4* 9.7 8.9  CREATININE 1.25 1.35* 1.49* 1.4* 1.28*  GFRNONAA 60 54* 48*  --  57*  GFRAA 70 >60 55*  --  >60    LIVER FUNCTION TESTS:  Recent Labs  09/10/15 0839 10/21/15 0948 10/23/15 1256 12/10/15 0748  BILITOT 0.60 0.5 0.8 0.60  AST 31 15 17 20   ALT 25 11 12* 17  ALKPHOS 150* 159* 136* 160*  PROT  6.4 6.1 6.0* 7.1  ALBUMIN 3.2* 3.1* 2.3* 2.6*    TUMOR MARKERS: No results for input(s): AFPTM, CEA, CA199, CHROMGRNA in the last 8760 hours.  Assessment and Plan: 65 y.o. 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 Recent PET scan on 12/17/15 reveals widespread hypermetabolic skeletal metastasis, hypermetabolic left lower lobe nodule, hypermetabolic metastatic adenopathy to the right hilum, hypermetabolic lymph nodes in the porta hepatis and retroperitoneal periaortic nodal stations as well as right supraclavicular lymph node. He presents today for CT-guided right iliac bone lesion biopsy for further evaluation. Risks and benefits discussed with the patient/daughter including, but not limited to bleeding, infection, damage to adjacent structures or low yield requiring additional tests.All of the patient's questions were answered, patient is agreeable to proceed.Consent signed and in chart.     Thank you for this interesting consult.  I greatly enjoyed meeting Gilbert [name] and look forward to participating in their care.  A copy of this report was sent to the requesting provider on this date.  Electronically Signed: D. Rowe Robert 01/02/2016, 8:30 AM   I spent a total of  20 minutes   in face to face in clinical consultation, greater than 50% of which was counseling/coordinating care for CT guided right iliac bone lesion biopsy

## 2016-01-02 NOTE — Procedures (Signed)
Interventional Radiology Procedure Note  Procedure: CT guided core biopsy of right iliac bone Complications: None Recommendations: - Bedrest supine x 1 hrs  Kippy Melena T. Kathlene Cote, M.D Pager:  [phone]

## 2016-01-07 ENCOUNTER — Other Ambulatory Visit: Payer: Self-pay | Admitting: Hematology & Oncology

## 2016-01-07 ENCOUNTER — Encounter: Payer: Self-pay | Admitting: Hematology & Oncology

## 2016-01-07 DIAGNOSIS — C8580 Other specified types of non-Hodgkin lymphoma, unspecified site: Secondary | ICD-10-CM

## 2016-01-07 DIAGNOSIS — C8338 Diffuse large B-cell lymphoma, lymph nodes of multiple sites: Secondary | ICD-10-CM

## 2016-01-07 DIAGNOSIS — C8330 Diffuse large B-cell lymphoma, unspecified site: Secondary | ICD-10-CM

## 2016-01-07 HISTORY — DX: Diffuse large B-cell lymphoma, unspecified site: C83.30

## 2016-01-08 ENCOUNTER — Other Ambulatory Visit (HOSPITAL_BASED_OUTPATIENT_CLINIC_OR_DEPARTMENT_OTHER): Payer: Medicare Other

## 2016-01-08 ENCOUNTER — Encounter: Payer: Self-pay | Admitting: Hematology & Oncology

## 2016-01-08 ENCOUNTER — Ambulatory Visit (HOSPITAL_BASED_OUTPATIENT_CLINIC_OR_DEPARTMENT_OTHER): Payer: Medicare Other | Admitting: Hematology & Oncology

## 2016-01-08 VITALS — BP 128/78 | HR 68 | Temp 97.8°F | Resp 14 | Ht 70.0 in | Wt 158.0 lb

## 2016-01-08 DIAGNOSIS — C8338 Diffuse large B-cell lymphoma, lymph nodes of multiple sites: Secondary | ICD-10-CM | POA: Diagnosis not present

## 2016-01-08 DIAGNOSIS — C641 Malignant neoplasm of right kidney, except renal pelvis: Secondary | ICD-10-CM

## 2016-01-08 DIAGNOSIS — D701 Agranulocytosis secondary to cancer chemotherapy: Secondary | ICD-10-CM

## 2016-01-08 DIAGNOSIS — C8339 Diffuse large B-cell lymphoma, extranodal and solid organ sites: Secondary | ICD-10-CM

## 2016-01-08 DIAGNOSIS — C8589 Other specified types of non-Hodgkin lymphoma, extranodal and solid organ sites: Secondary | ICD-10-CM | POA: Insufficient documentation

## 2016-01-08 DIAGNOSIS — C8330 Diffuse large B-cell lymphoma, unspecified site: Secondary | ICD-10-CM

## 2016-01-08 DIAGNOSIS — C8580 Other specified types of non-Hodgkin lymphoma, unspecified site: Principal | ICD-10-CM

## 2016-01-08 HISTORY — DX: Other specified types of non-hodgkin lymphoma, extranodal and solid organ sites: C85.89

## 2016-01-08 LAB — CBC WITH DIFFERENTIAL (CANCER CENTER ONLY)
BASO#: 0 10*3/uL (ref 0.0–0.2)
BASO%: 0.3 % (ref 0.0–2.0)
EOS ABS: 0.4 10*3/uL (ref 0.0–0.5)
EOS%: 4.4 % (ref 0.0–7.0)
HEMATOCRIT: 34.8 % — AB (ref 38.7–49.9)
HEMOGLOBIN: 11.9 g/dL — AB (ref 13.0–17.1)
LYMPH#: 1.2 10*3/uL (ref 0.9–3.3)
LYMPH%: 13.6 % — ABNORMAL LOW (ref 14.0–48.0)
MCH: 28.5 pg (ref 28.0–33.4)
MCHC: 34.2 g/dL (ref 32.0–35.9)
MCV: 83 fL (ref 82–98)
MONO#: 0.5 10*3/uL (ref 0.1–0.9)
MONO%: 6 % (ref 0.0–13.0)
NEUT%: 75.7 % (ref 40.0–80.0)
NEUTROS ABS: 6.7 10*3/uL — AB (ref 1.5–6.5)
Platelets: 219 10*3/uL (ref 145–400)
RBC: 4.18 10*6/uL — ABNORMAL LOW (ref 4.20–5.70)
RDW: 14.3 % (ref 11.1–15.7)
WBC: 8.8 10*3/uL (ref 4.0–10.0)

## 2016-01-08 LAB — CMP (CANCER CENTER ONLY)
ALBUMIN: 2.7 g/dL — AB (ref 3.3–5.5)
ALT(SGPT): 20 U/L (ref 10–47)
AST: 21 U/L (ref 11–38)
Alkaline Phosphatase: 156 U/L — ABNORMAL HIGH (ref 26–84)
BUN, Bld: 17 mg/dL (ref 7–22)
CALCIUM: 9.6 mg/dL (ref 8.0–10.3)
CHLORIDE: 97 meq/L — AB (ref 98–108)
CO2: 26 meq/L (ref 18–33)
Creat: 1.5 mg/dl — ABNORMAL HIGH (ref 0.6–1.2)
Glucose, Bld: 89 mg/dL (ref 73–118)
Potassium: 3.8 mEq/L (ref 3.3–4.7)
SODIUM: 134 meq/L (ref 128–145)
Total Bilirubin: 0.7 mg/dl (ref 0.20–1.60)
Total Protein: 7.5 g/dL (ref 6.4–8.1)

## 2016-01-08 LAB — LACTATE DEHYDROGENASE: LDH: 164 U/L (ref 125–245)

## 2016-01-09 LAB — HEPATITIS PANEL, ACUTE
HEP B C IGM: NEGATIVE
HEP B S AG: NEGATIVE
Hep A Ab, IgM: NEGATIVE

## 2016-01-09 NOTE — Progress Notes (Signed)
Hematology and Oncology Follow Up Visit  Gilbert [name] [id number] 1951-03-05 65 y.o. 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 is back for a follow-up. [name] enough, he now has diffuse large cell non-Hodgkin's lymphoma. When I saw him back in [place], he had a CT scan is for routine restaging for his kidney cancer. [name] know, this showed adenopathy. He has some pulmonary nodules. He had lytic lesions in his bones.  We went ahead and got a biopsy. This was a right iliac bone biopsy. The pathology report [id number]) showed a diffuse large cell non-Hodgkin's lymphoma. This is a the cell lymphoma.  Him very surprised by this. Thankfully, this is a condition that we can treat and hopefully cure.  He is not complaining of any pain. His appetite is fairly decent. He's had no cough. He's had no change in bowel or bladder habits. He has had no fever.  The pathology report showed the tumor to be CD20 positive.  I think he will definitely need a bone marrow test.  He does have a cardiac history. We need to get a MUGA scan on him. This will really dictating [place] of chemotherapy we can use.  He's not noted any obvious palpable lymph nodes.  Currently, his performance status is ECOG 1.  Medications:  Current outpatient prescriptions:  .  amLODipine (NORVASC) 10 MG tablet, Take 10 mg by mouth daily., Disp: , Rfl:  .  aspirin EC 81 MG tablet, Take 1 tablet (81 mg total) by mouth daily. (Patient taking differently: Take 81 mg by mouth every morning. ), Disp: , Rfl:  .  atorvastatin (LIPITOR) 80 MG tablet, Take 80 mg by mouth daily., Disp: , Rfl: 3 .  carvedilol (COREG) 12.5 MG tablet, Take 12.5 mg by mouth 2 (two) times daily with a meal. , Disp: , Rfl:  .  clopidogrel (PLAVIX) 75 MG tablet, , Disp: , Rfl: 0 .   Cyanocobalamin (B-12) 5000 MCG CAPS, Take 5,000 mcg by mouth every morning. , Disp: , Rfl:  .  cyclobenzaprine (FLEXERIL) 10 MG tablet, Take 1 tablet (10 mg total) by mouth 3 (three) times daily as needed for muscle spasms. (Patient taking differently: Take 5 mg by mouth 3 (three) times daily as needed for muscle spasms. ), Disp: 20 tablet, Rfl: 0 .  PHENYTEK 300 MG ER capsule, Take 1 capsule (300 mg total) by mouth every morning., Disp: 30 capsule, Rfl: 11 .  polyethylene glycol (MIRALAX / GLYCOLAX) packet, Take 17 g by mouth daily., Disp: , Rfl:  .  Psyllium (METAMUCIL PO), Take 3 capsules by mouth every evening. Reported on 10/24/2015, Disp: , Rfl:  .  pyridOXINE (VITAMIN B-6) 100 MG tablet, Take 100 mg by mouth every morning. , Disp: , Rfl:  .  Thiamine HCl (VITAMIN B-1) 250 MG tablet, Take 250 mg by mouth every morning. , Disp: , Rfl:  .  traMADol (ULTRAM) 50 MG tablet, Take 50 mg by mouth every 6 (six) hours as needed for moderate pain or severe pain. , Disp: , Rfl:  .  valsartan (DIOVAN) 160 MG tablet, Take 160 mg by mouth every morning. Reported on 11/05/2015, Disp: , Rfl:   Allergies:  Allergies  Allergen Reactions  . Oxycodone Swelling and Other (See Comments)    Tongue and lip  Past Medical History, Surgical history, Social history, and Family History were reviewed and updated.  Review of Systems: As above  Physical Exam:  height is 5\' 10"  (1.778 m) and weight is 158 lb (71.668 kg). His oral temperature is 97.8 F (36.6 C). His blood pressure is 128/78 and his pulse is 68. His respiration is 14.   Wt Readings from Last 3 Encounters:  01/08/16 158 lb (71.668 kg)  12/10/15 159 lb (72.122 kg)  11/05/15 163 lb (73.936 kg)     Head and neck exam shows no ocular or oral lesions. He has no adenopathy in the neck. Lungs are clear. Cardiac exam regular rate and rhythm with no murmurs rubs or bruits. Abdomen is soft. He has laparoscopy scars that are well-healed from his right  nephrectomy. There is no fluid wave. There is no palpable liver or spleen tip. Back exam shows no tenderness over the spine ribs or hips. Extremities shows no clubbing cyanosis or edema. Skin exam no rashes, ecchymoses or petechia. Neurological exam is nonfocal.  Lab Results  Component Value Date   WBC 8.8 01/08/2016   HGB 11.9* 01/08/2016   HCT 34.8* 01/08/2016   MCV 83 01/08/2016   PLT 219 01/08/2016     Chemistry      Component Value Date/Time   NA 134 01/08/2016 1148   NA 138 01/02/2016 0732   K 3.8 01/08/2016 1148   K 3.9 01/02/2016 0732   CL 97* 01/08/2016 1148   CL 105 01/02/2016 0732   CO2 26 01/08/2016 1148   CO2 25 01/02/2016 0732   BUN 17 01/08/2016 1148   BUN 19 01/02/2016 0732   CREATININE 1.5* 01/08/2016 1148   CREATININE 1.28* 01/02/2016 0732      Component Value Date/Time   CALCIUM 9.6 01/08/2016 1148   CALCIUM 8.9 01/02/2016 0732   ALKPHOS 156* 01/08/2016 1148   ALKPHOS 136* 10/23/2015 1256   AST 21 01/08/2016 1148   AST 17 10/23/2015 1256   ALT 20 01/08/2016 1148   ALT 12* 10/23/2015 1256   BILITOT 0.70 01/08/2016 1148   BILITOT 0.8 10/23/2015 1256         Impression and Plan: [name] is a 65 year old white male. He had a clear cell carcinoma right kidney resected back in March 2015. Shockingly enough, he now has a second malignancy.  He has a diffuse large cell non-Hodgkin's lymphoma. I'll have to have pathology do studies to see if this is a "double hit" lymphoma.  He clearly needs chemotherapy. In order to do this, he will need a Port-A-Cath to be placed.  I talked he has daughter for about 45 minutes. This is a totally unexpected diagnosis but yet one that is much better than metastatic kidney cancer.  The real question is what type of protocol to use. This will be dictated by his cardiac studies. I would like to hope that we could use an agent mycin-based regimen, such as R-CHOP. If his cardiac function is not that good, that he Doerr have to  use R-CEOP, in which etoposide is substituted for Adriamycin.  We will see what his bone marrow shows.  I taught him about the side effects. I told him that he would lose his hair. He will need Neulasta to help with chemotherapy-induced neutropenia and decrease his risk of infection. He Dugue have constipation. Hyn[place]man have diarrhea. Hopefully will not have mouth sores. Fatigue I think will be the biggest side effect. Hopefully, nausea and vomiting would not be  too bad. I told him that he has to wear sunscreen when he is out in the sun because the chemotherapy can make his skin which more sensitive. He likes to play golf.  I really hope and pray that we can cure this. It is hard to tell what his IPI score is.  We will get everything arranged in another week or so. He wants go on his golfing adventure with his friends and not do anything until after he gets back. I think this is reasonable.   Volanda Napoleon, MD 6/9/20177:27 AM

## 2016-01-13 ENCOUNTER — Other Ambulatory Visit: Payer: Medicare Other

## 2016-01-13 ENCOUNTER — Encounter: Payer: Self-pay | Admitting: *Deleted

## 2016-01-14 ENCOUNTER — Other Ambulatory Visit: Payer: Self-pay | Admitting: *Deleted

## 2016-01-14 DIAGNOSIS — C8330 Diffuse large B-cell lymphoma, unspecified site: Secondary | ICD-10-CM

## 2016-01-14 DIAGNOSIS — C8580 Other specified types of non-Hodgkin lymphoma, unspecified site: Principal | ICD-10-CM

## 2016-01-14 MED ORDER — LIDOCAINE-PRILOCAINE 2.5-2.5 % EX CREA: TOPICAL_CREAM | CUTANEOUS | Status: DC

## 2016-01-14 MED ORDER — PROCHLORPERAZINE MALEATE 10 MG PO TABS: 10.0000 mg | ORAL_TABLET | Freq: Four times a day (QID) | ORAL | Status: DC | PRN

## 2016-01-14 MED ORDER — ONDANSETRON HCL 8 MG PO TABS: 8.0000 mg | ORAL_TABLET | Freq: Two times a day (BID) | ORAL | Status: DC | PRN

## 2016-01-14 MED ORDER — LORAZEPAM 0.5 MG PO TABS: 0.5000 mg | ORAL_TABLET | Freq: Four times a day (QID) | ORAL | Status: DC | PRN

## 2016-01-15 ENCOUNTER — Other Ambulatory Visit (HOSPITAL_COMMUNITY): Payer: Medicare Other

## 2016-01-16 ENCOUNTER — Other Ambulatory Visit (HOSPITAL_COMMUNITY): Payer: Medicare Other

## 2016-01-16 ENCOUNTER — Inpatient Hospital Stay (HOSPITAL_COMMUNITY): Admission: RE | Admit: 2016-01-16 | Payer: Medicare Other | Source: Ambulatory Visit

## 2016-01-16 ENCOUNTER — Ambulatory Visit (HOSPITAL_COMMUNITY): Payer: Medicare Other

## 2016-01-16 ENCOUNTER — Encounter: Payer: Self-pay | Admitting: Hematology & Oncology

## 2016-01-19 ENCOUNTER — Other Ambulatory Visit: Payer: Self-pay | Admitting: Radiology

## 2016-01-19 ENCOUNTER — Encounter: Payer: Self-pay | Admitting: Hematology & Oncology

## 2016-01-19 ENCOUNTER — Ambulatory Visit (HOSPITAL_COMMUNITY)
Admission: RE | Admit: 2016-01-19 | Discharge: 2016-01-19 | Disposition: A | Discharge status: Home / Self Care | Payer: Medicare Other | Source: Ambulatory Visit | Attending: Hematology & Oncology | Admitting: Hematology & Oncology

## 2016-01-19 DIAGNOSIS — Z825 Family history of asthma and other chronic lower respiratory diseases: Secondary | ICD-10-CM | POA: Diagnosis not present

## 2016-01-19 DIAGNOSIS — I252 Old myocardial infarction: Secondary | ICD-10-CM | POA: Insufficient documentation

## 2016-01-19 DIAGNOSIS — C649 Malignant neoplasm of unspecified kidney, except renal pelvis: Secondary | ICD-10-CM | POA: Diagnosis not present

## 2016-01-19 DIAGNOSIS — C8339 Diffuse large B-cell lymphoma, extranodal and solid organ sites: Secondary | ICD-10-CM | POA: Diagnosis not present

## 2016-01-19 DIAGNOSIS — I2510 Atherosclerotic heart disease of native coronary artery without angina pectoris: Secondary | ICD-10-CM | POA: Insufficient documentation

## 2016-01-19 DIAGNOSIS — Z87891 Personal history of nicotine dependence: Secondary | ICD-10-CM | POA: Diagnosis not present

## 2016-01-19 DIAGNOSIS — Z8249 Family history of ischemic heart disease and other diseases of the circulatory system: Secondary | ICD-10-CM | POA: Insufficient documentation

## 2016-01-19 DIAGNOSIS — K219 Gastro-esophageal reflux disease without esophagitis: Secondary | ICD-10-CM | POA: Insufficient documentation

## 2016-01-19 DIAGNOSIS — R569 Unspecified convulsions: Secondary | ICD-10-CM | POA: Insufficient documentation

## 2016-01-19 DIAGNOSIS — I10 Essential (primary) hypertension: Secondary | ICD-10-CM | POA: Insufficient documentation

## 2016-01-19 DIAGNOSIS — E7800 Pure hypercholesterolemia, unspecified: Secondary | ICD-10-CM | POA: Insufficient documentation

## 2016-01-19 DIAGNOSIS — Z7902 Long term (current) use of antithrombotics/antiplatelets: Secondary | ICD-10-CM | POA: Insufficient documentation

## 2016-01-19 DIAGNOSIS — Z823 Family history of stroke: Secondary | ICD-10-CM | POA: Insufficient documentation

## 2016-01-19 DIAGNOSIS — Z85528 Personal history of other malignant neoplasm of kidney: Secondary | ICD-10-CM | POA: Diagnosis not present

## 2016-01-19 DIAGNOSIS — Z8674 Personal history of sudden cardiac arrest: Secondary | ICD-10-CM | POA: Diagnosis not present

## 2016-01-19 DIAGNOSIS — C8580 Other specified types of non-Hodgkin lymphoma, unspecified site: Secondary | ICD-10-CM

## 2016-01-19 DIAGNOSIS — Z79899 Other long term (current) drug therapy: Secondary | ICD-10-CM | POA: Diagnosis not present

## 2016-01-19 DIAGNOSIS — Z955 Presence of coronary angioplasty implant and graft: Secondary | ICD-10-CM | POA: Insufficient documentation

## 2016-01-19 DIAGNOSIS — Z885 Allergy status to narcotic agent status: Secondary | ICD-10-CM | POA: Diagnosis not present

## 2016-01-19 DIAGNOSIS — D649 Anemia, unspecified: Secondary | ICD-10-CM | POA: Insufficient documentation

## 2016-01-19 DIAGNOSIS — C8330 Diffuse large B-cell lymphoma, unspecified site: Secondary | ICD-10-CM

## 2016-01-19 DIAGNOSIS — Z7982 Long term (current) use of aspirin: Secondary | ICD-10-CM | POA: Diagnosis not present

## 2016-01-19 DIAGNOSIS — C8589 Other specified types of non-Hodgkin lymphoma, extranodal and solid organ sites: Secondary | ICD-10-CM

## 2016-01-19 DIAGNOSIS — Z905 Acquired absence of kidney: Secondary | ICD-10-CM | POA: Diagnosis not present

## 2016-01-19 DIAGNOSIS — I739 Peripheral vascular disease, unspecified: Secondary | ICD-10-CM | POA: Insufficient documentation

## 2016-01-19 MED ORDER — TECHNETIUM TC 99M-LABELED RED BLOOD CELLS IV KIT
21.9000 | PACK | Freq: Once | INTRAVENOUS | Status: AC | PRN
  Administered 2016-01-19: 21.9 via INTRAVENOUS

## 2016-01-20 ENCOUNTER — Ambulatory Visit (HOSPITAL_COMMUNITY)
Admission: RE | Admit: 2016-01-20 | Discharge: 2016-01-20 | Disposition: A | Discharge status: Home / Self Care | Payer: Medicare Other | Source: Ambulatory Visit | Attending: Hematology & Oncology | Admitting: Hematology & Oncology

## 2016-01-20 ENCOUNTER — Encounter (HOSPITAL_COMMUNITY): Payer: Self-pay

## 2016-01-20 ENCOUNTER — Other Ambulatory Visit: Payer: Self-pay | Admitting: Hematology & Oncology

## 2016-01-20 DIAGNOSIS — C8589 Other specified types of non-Hodgkin lymphoma, extranodal and solid organ sites: Secondary | ICD-10-CM

## 2016-01-20 DIAGNOSIS — C8580 Other specified types of non-Hodgkin lymphoma, unspecified site: Principal | ICD-10-CM

## 2016-01-20 DIAGNOSIS — Z7982 Long term (current) use of aspirin: Secondary | ICD-10-CM | POA: Diagnosis not present

## 2016-01-20 DIAGNOSIS — Z8674 Personal history of sudden cardiac arrest: Secondary | ICD-10-CM | POA: Diagnosis not present

## 2016-01-20 DIAGNOSIS — I10 Essential (primary) hypertension: Secondary | ICD-10-CM | POA: Diagnosis not present

## 2016-01-20 DIAGNOSIS — Z8249 Family history of ischemic heart disease and other diseases of the circulatory system: Secondary | ICD-10-CM | POA: Diagnosis not present

## 2016-01-20 DIAGNOSIS — Z85528 Personal history of other malignant neoplasm of kidney: Secondary | ICD-10-CM | POA: Diagnosis not present

## 2016-01-20 DIAGNOSIS — Z885 Allergy status to narcotic agent status: Secondary | ICD-10-CM | POA: Diagnosis not present

## 2016-01-20 DIAGNOSIS — Z825 Family history of asthma and other chronic lower respiratory diseases: Secondary | ICD-10-CM | POA: Diagnosis not present

## 2016-01-20 DIAGNOSIS — C8330 Diffuse large B-cell lymphoma, unspecified site: Secondary | ICD-10-CM

## 2016-01-20 DIAGNOSIS — Z905 Acquired absence of kidney: Secondary | ICD-10-CM | POA: Diagnosis not present

## 2016-01-20 DIAGNOSIS — K219 Gastro-esophageal reflux disease without esophagitis: Secondary | ICD-10-CM | POA: Diagnosis not present

## 2016-01-20 DIAGNOSIS — Z79899 Other long term (current) drug therapy: Secondary | ICD-10-CM | POA: Diagnosis not present

## 2016-01-20 DIAGNOSIS — I739 Peripheral vascular disease, unspecified: Secondary | ICD-10-CM | POA: Diagnosis not present

## 2016-01-20 DIAGNOSIS — R569 Unspecified convulsions: Secondary | ICD-10-CM | POA: Diagnosis not present

## 2016-01-20 DIAGNOSIS — Z823 Family history of stroke: Secondary | ICD-10-CM | POA: Diagnosis not present

## 2016-01-20 DIAGNOSIS — Z452 Encounter for adjustment and management of vascular access device: Secondary | ICD-10-CM | POA: Diagnosis not present

## 2016-01-20 DIAGNOSIS — Z955 Presence of coronary angioplasty implant and graft: Secondary | ICD-10-CM | POA: Diagnosis not present

## 2016-01-20 DIAGNOSIS — Z87891 Personal history of nicotine dependence: Secondary | ICD-10-CM | POA: Diagnosis not present

## 2016-01-20 DIAGNOSIS — I252 Old myocardial infarction: Secondary | ICD-10-CM | POA: Diagnosis not present

## 2016-01-20 DIAGNOSIS — C8339 Diffuse large B-cell lymphoma, extranodal and solid organ sites: Secondary | ICD-10-CM | POA: Diagnosis not present

## 2016-01-20 DIAGNOSIS — C8590 Non-Hodgkin lymphoma, unspecified, unspecified site: Secondary | ICD-10-CM | POA: Diagnosis not present

## 2016-01-20 DIAGNOSIS — D649 Anemia, unspecified: Secondary | ICD-10-CM | POA: Diagnosis not present

## 2016-01-20 DIAGNOSIS — E7800 Pure hypercholesterolemia, unspecified: Secondary | ICD-10-CM | POA: Diagnosis not present

## 2016-01-20 DIAGNOSIS — I2510 Atherosclerotic heart disease of native coronary artery without angina pectoris: Secondary | ICD-10-CM | POA: Diagnosis not present

## 2016-01-20 DIAGNOSIS — Z7902 Long term (current) use of antithrombotics/antiplatelets: Secondary | ICD-10-CM | POA: Diagnosis not present

## 2016-01-20 LAB — CBC
HCT: 29.7 % — ABNORMAL LOW (ref 39.0–52.0)
Hemoglobin: 10.2 g/dL — ABNORMAL LOW (ref 13.0–17.0)
MCH: 27.6 pg (ref 26.0–34.0)
MCHC: 34.3 g/dL (ref 30.0–36.0)
MCV: 80.3 fL (ref 78.0–100.0)
PLATELETS: 208 10*3/uL (ref 150–400)
RBC: 3.7 MIL/uL — ABNORMAL LOW (ref 4.22–5.81)
RDW: 14 % (ref 11.5–15.5)
WBC: 9.5 10*3/uL (ref 4.0–10.5)

## 2016-01-20 LAB — BASIC METABOLIC PANEL
ANION GAP: 10 (ref 5–15)
BUN: 14 mg/dL (ref 6–20)
CALCIUM: 8.7 mg/dL — AB (ref 8.9–10.3)
CO2: 22 mmol/L (ref 22–32)
Chloride: 105 mmol/L (ref 101–111)
Creatinine, Ser: 1.26 mg/dL — ABNORMAL HIGH (ref 0.61–1.24)
GFR calc Af Amer: >60 mL/min (ref >60)
GFR, EST NON AFRICAN AMERICAN: 58 mL/min — AB (ref >60)
GLUCOSE: 87 mg/dL (ref 65–99)
Potassium: 3.9 mmol/L (ref 3.5–5.1)
Sodium: 137 mmol/L (ref 135–145)

## 2016-01-20 LAB — PROTIME-INR
INR: 1.18 (ref 0.00–1.49)
PROTHROMBIN TIME: 14.7 s (ref 11.6–15.2)

## 2016-01-20 LAB — BONE MARROW EXAM

## 2016-01-20 LAB — APTT: aPTT: 50 seconds — ABNORMAL HIGH (ref 24–37)

## 2016-01-20 MED ORDER — LIDOCAINE HCL 1 % IJ SOLN
INTRAMUSCULAR | Status: AC
  Filled 2016-01-20: qty 20

## 2016-01-20 MED ORDER — HEPARIN SOD (PORK) LOCK FLUSH 100 UNIT/ML IV SOLN
INTRAVENOUS | Status: AC
  Filled 2016-01-20: qty 5

## 2016-01-20 MED ORDER — CEFAZOLIN SODIUM-DEXTROSE 2-4 GM/100ML-% IV SOLN
2.0000 g | INTRAVENOUS | Status: AC
  Administered 2016-01-20: 2 g via INTRAVENOUS
  Filled 2016-01-20: qty 100

## 2016-01-20 MED ORDER — MIDAZOLAM HCL 2 MG/2ML IJ SOLN
INTRAMUSCULAR | Status: AC | PRN
  Administered 2016-01-20 (×2): 1 mg via INTRAVENOUS

## 2016-01-20 MED ORDER — SODIUM CHLORIDE 0.9 % IV SOLN
Freq: Once | INTRAVENOUS | Status: AC
  Administered 2016-01-20: 10:00:00 via INTRAVENOUS

## 2016-01-20 MED ORDER — MIDAZOLAM HCL 2 MG/2ML IJ SOLN
INTRAMUSCULAR | Status: AC | PRN
  Administered 2016-01-20: 1 mg via INTRAVENOUS

## 2016-01-20 MED ORDER — FENTANYL CITRATE (PF) 100 MCG/2ML IJ SOLN
INTRAMUSCULAR | Status: AC | PRN
  Administered 2016-01-20: 25 ug via INTRAVENOUS

## 2016-01-20 MED ORDER — MIDAZOLAM HCL 2 MG/2ML IJ SOLN
INTRAMUSCULAR | Status: AC
  Filled 2016-01-20: qty 6

## 2016-01-20 MED ORDER — LIDOCAINE HCL 1 % IJ SOLN
INTRAMUSCULAR | Status: AC | PRN
  Administered 2016-01-20: 10 mL via INTRADERMAL

## 2016-01-20 MED ORDER — FENTANYL CITRATE (PF) 100 MCG/2ML IJ SOLN
INTRAMUSCULAR | Status: AC | PRN
  Administered 2016-01-20: 50 ug via INTRAVENOUS
  Administered 2016-01-20: 25 ug via INTRAVENOUS

## 2016-01-20 MED ORDER — FENTANYL CITRATE (PF) 100 MCG/2ML IJ SOLN
INTRAMUSCULAR | Status: AC
  Filled 2016-01-20: qty 6

## 2016-01-20 NOTE — H&P (Signed)
Chief Complaint: NHL  Referring Physician:Dr. Burney Gauze  Supervising Physician: Corrie Mckusick  Patient Status:Out-pt  HPI: Gilbert [name] is an 65 y.o. male who we recently evaluated for a right iliac bone biopsy to rule out mets from his history of renal cell carcinoma.  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  He has presented today for a bone marrow biopsy to help determine a treatment strategy as well as a port a cath for chemotherapy.  He does take plavix and this has been held for the last 5 days.  Past Medical History:  Past Medical History  Diagnosis Date  . Gout   . GERD (gastroesophageal reflux disease)   . Hypertension   . Hypercholesteremia   . Shortness of breath     with activity  . History of epilepsy     at least 10 years ago. 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 Family history of anesthesia complication     mother-had stroke during anesthesia  . HTN (hypertension)   . Seizures ([place])   . History of myocardial infarction less than 8 weeks     Stent placed  . CAD (coronary artery disease)   . PAD (peripheral artery disease) ([place])   . Hyperlipemia   . Therapeutic drug monitoring   . Coronary artery calcification seen on CAT scan   . SOB (shortness of breath)   . History of tobacco use   . Systolic and diastolic CHF, chronic (HCC)     Resolved. EF 45% 04/10/2014  . Essential hypertension, benign   . History of nephrectomy 11/2013    For renal cell carcinoma  . Acid reflux   . Abnormal liver enzymes     Chronic alkaline phosphatase elevation since 2014  . History of cardiac arrest 11/26/13    PTCA/Stenting of LM & Prox LAD with 4.0 x 18 mm Xience alpine stent. Used Impella Circulatory assist device. EF= 20-25%  . Coronary atherosclerosis of native coronary artery   . Cancer ([place]) 08/2013    right kidney cancer  . Cancer ([place])   . 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 No past surgeries    . Eye surgery Left 2 weeks ago    torn retina  . Robot assisted laparoscopic nephrectomy Right 10/19/2013    Procedure: ROBOTIC ASSISTED LAPAROSCOPIC NEPHRECTOMY,  EXTENSIVE ADHESIOLYSIS;  Surgeon: Alexis Frock, MD;  Location: [facility] ORS;  Service: Urology;  Laterality: Right;  . Coronary angioplasty with stent placement  2015    Left main coronary artery stenting on emergent basis along with circulatory support with Impella device through left leg. With 4.0 x 18 mm Xience alpine stent  . Nephrectomy  11/2013  . Iliac artery stent Left 05/28/2014    dr Einar Gip  . Left heart catheterization with coronary angiogram N/A 11/27/2013    Procedure: LEFT HEART CATHETERIZATION WITH CORONARY ANGIOGRAM;  Surgeon: [name], MD;  Location: [facility] CATH LAB;  Service: Cardiovascular;  Laterality: N/A;  . Percutaneous coronary stent intervention (pci-s)  11/27/2013    Procedure: PERCUTANEOUS CORONARY STENT INTERVENTION (PCI-S);  Surgeon: [name], MD;  Location: [facility] CATH LAB;  Service: Cardiovascular;;  . Lower extremity angiogram N/A 02/26/2014    Procedure: LOWER EXTREMITY ANGIOGRAM;  Surgeon: [name], MD;  Location: [facility] CATH LAB;  Service: Cardiovascular;  Laterality: N/A;  . Lower extremity angiogram N/A 05/28/2014    Procedure: LOWER EXTREMITY ANGIOGRAM;  Surgeon: [name], MD;  Location: [facility] CATH LAB;  Service: Cardiovascular;  Laterality: N/A;  . Esophagogastroduodenoscopy    . Esophagogastroduodenoscopy N/A 01/21/2015    Procedure: ESOPHAGOGASTRODUODENOSCOPY (EGD) with possible dilation.;  Surgeon: Gatha Mayer, MD;  Location: [facility] ENDOSCOPY;  Service: Endoscopy;  Laterality: N/A;  . Balloon dilation N/A 01/21/2015    Procedure: BALLOON DILATION;  Surgeon: Gatha Mayer, MD;  Location: [facility] ENDOSCOPY;  Service: Endoscopy;  Laterality: N/A;  . Cardiac catheterization N/A 06/10/2015     Procedure: Left Heart Cath and Coronary Angiography;  Surgeon: Adrian Prows, MD;  Location: [facility] CV LAB;  Service: Cardiovascular;  Laterality: N/A;    Family History:  Family History  Problem Relation Age of Onset  . Stroke Mother   . Heart disease Brother     multiple stents  . Hyperlipidemia Brother   . Emphysema Father     Social History:  reports that he quit smoking about 12 years ago. His smoking use included Cigarettes. He has a 90 pack-year smoking history. His smokeless tobacco use includes Chew. He reports that he does not drink alcohol or use illicit drugs.  Allergies:  Allergies  Allergen Reactions  . Oxycodone Swelling and Other (See Comments)    Tongue and lip    Medications:   Medication List    ASK your doctor about these medications        amLODipine 10 MG tablet  Commonly known as:  NORVASC  Take 10 mg by mouth daily.     aspirin EC 81 MG tablet  Take 1 tablet (81 mg total) by mouth daily.     atorvastatin 80 MG tablet  Commonly known as:  LIPITOR  Take 80 mg by mouth daily.     B-12 5000 MCG Caps  Take 5,000 mcg by mouth every morning.     carvedilol 12.5 MG tablet  Commonly known as:  COREG  Take 12.5 mg by mouth 2 (two) times daily with a meal.     clopidogrel 75 MG tablet  Commonly known as:  PLAVIX     cyclobenzaprine 10 MG tablet  Commonly known as:  FLEXERIL  Take 1 tablet (10 mg total) by mouth 3 (three) times daily as needed for muscle spasms.     lidocaine-prilocaine cream  Commonly known as:  EMLA  Apply to affected area as needed     LORazepam 0.5 MG tablet  Commonly known as:  ATIVAN  Take 1 tablet (0.5 mg total) by mouth every 6 (six) hours as needed (Nausea or vomiting).     METAMUCIL PO  Take 3 capsules by mouth every evening. Reported on 10/24/2015     ondansetron 8 MG tablet  Commonly known as:  ZOFRAN  Take 1 tablet (8 mg total) by mouth 2 (two) times daily as needed for refractory nausea / vomiting. Start  on day 3 after cyclophosphamide chemotherapy.     PHENYTEK 300 MG ER capsule  Generic drug:  phenytoin  Take 1 capsule (300 mg total) by mouth every morning.     polyethylene glycol packet  Commonly known as:  MIRALAX / GLYCOLAX  Take 17 g by mouth daily.     prochlorperazine 10 MG tablet  Commonly known as:  COMPAZINE  Take 1 tablet (10 mg total) by mouth every 6 (six) hours as needed (Nausea or vomiting).     pyridOXINE  100 MG tablet  Commonly known as:  VITAMIN B-6  Take 100 mg by mouth every morning.     traMADol 50 MG tablet  Commonly known as:  ULTRAM  Take 50 mg by mouth every 6 (six) hours as needed for moderate pain or severe pain.     valsartan 160 MG tablet  Commonly known as:  DIOVAN  Take 160 mg by mouth every morning. Reported on 11/05/2015     vitamin B-1 250 MG tablet  Take 250 mg by mouth every morning.        Please HPI for pertinent positives, otherwise complete 10 system ROS negative.  Mallampati Score: MD Evaluation Airway: WNL Heart: WNL Abdomen: WNL Chest/ Lungs: WNL ASA  Classification: 3 Mallampati/Airway Score: Two  Physical Exam: BP 130/81 mmHg  Pulse 64  Temp(Src) 98.2 F (36.8 C) (Oral)  Resp 16  SpO2 100% There is no weight on file to calculate BMI. General: pleasant, WD, WN white male who is laying in bed in NAD HEENT: head is normocephalic, atraumatic.  Sclera are noninjected.  PERRL.  Ears and nose without any masses or lesions.  Mouth is pink and moist Heart: regular, rate, and rhythm.  Normal s1,s2. No obvious murmurs, gallops, or rubs noted.  Palpable radial and pedal pulses bilaterally Lungs: CTAB, no wheezes, rhonchi, or rales noted.  Respiratory effort nonlabored Abd: soft, NT, ND, +BS, no masses, hernias, or organomegaly MS: all 4 extremities are symmetrical with no cyanosis, clubbing, or edema. Psych: A&Ox3 with an appropriate affect.   Labs: Results for orders placed or performed during the hospital encounter of  01/20/16 (from the past 48 hour(s))  Basic metabolic panel     Status: Abnormal   Collection Time: 01/20/16  9:05 AM  Result Value Ref Range   Sodium 137 135 - 145 mmol/L   Potassium 3.9 3.5 - 5.1 mmol/L   Chloride 105 101 - 111 mmol/L   CO2 22 22 - 32 mmol/L   Glucose, Bld 87 65 - 99 mg/dL   BUN 14 6 - 20 mg/dL   Creatinine, Ser 1.26 (H) 0.61 - 1.24 mg/dL   Calcium 8.7 (L) 8.9 - 10.3 mg/dL   GFR calc non Af Amer 58 (L) >60 mL/min   GFR calc Af Amer >60 >60 mL/min    Comment: (NOTE) The eGFR has been calculated using the CKD EPI equation. This calculation has not been validated in all clinical situations. eGFR's persistently <60 mL/min signify possible Chronic Kidney Disease.    Anion gap 10 5 - 15  APTT upon arrival     Status: Abnormal   Collection Time: 01/20/16  9:05 AM  Result Value Ref Range   aPTT 50 (H) 24 - 37 seconds    Comment:        IF BASELINE aPTT IS ELEVATED, SUGGEST PATIENT RISK ASSESSMENT BE USED TO DETERMINE APPROPRIATE ANTICOAGULANT THERAPY.   CBC upon arrival     Status: Abnormal   Collection Time: 01/20/16  9:05 AM  Result Value Ref Range   WBC 9.5 4.0 - 10.5 K/uL   RBC 3.70 (L) 4.22 - 5.81 MIL/uL   Hemoglobin 10.2 (L) 13.0 - 17.0 g/dL   HCT 29.7 (L) 39.0 - 52.0 %   MCV 80.3 78.0 - 100.0 fL   MCH 27.6 26.0 - 34.0 pg   MCHC 34.3 30.0 - 36.0 g/dL   RDW 14.0 11.5 - 15.5 %   Platelets 208 150 - 400 K/uL  Protime-INR upon arrival  Status: None   Collection Time: 01/20/16  9:05 AM  Result Value Ref Range   Prothrombin Time 14.7 11.6 - 15.2 seconds   INR 1.18 0.00 - 1.49    Imaging: Nm Cardiac Muga Rest  01/19/2016  CLINICAL DATA:  Renal cell cancer. Evaluate cardiac function in relation to chemotherapy. EXAM: NUCLEAR MEDICINE CARDIAC BLOOD POOL IMAGING (MUGA) TECHNIQUE: Cardiac multi-gated acquisition was performed at rest following intravenous injection of Tc-26mlabeled red blood cells. RADIOPHARMACEUTICALS:  21.9 mCi Tc-948mDP in-vitro  labeled red blood cells IV COMPARISON:  None FINDINGS: No focal wall motion abnormality of the left ventricle. Calculated left ventricular ejection fraction equals 42% IMPRESSION: 1. Below normal ejection fraction equals 42%.  No comparison. Electronically Signed   By: StSuzy Bouchard.D.   On: 01/19/2016 13:31    Assessment/Plan 1. Non-Hodgkin's B-cell lymphoma -labs and vitals have been reviewed -we will plan for a bone marrow biopsy today as well as placement of a port a cath -Risks and Benefits discussed with the patient including, but not limited to bleeding, infection, pneumothorax, or fibrin sheath development and need for additional procedures. All of the patient's questions were answered, patient is agreeable to proceed. Consent signed and in chart. Risks and Benefits discussed with the patient including, but not limited to bleeding, infection, damage to adjacent structures or low yield requiring additional tests. All of the patient's questions were answered, patient is agreeable to proceed. Consent signed and in chart.   Thank you for this interesting consult.  I greatly enjoyed meeting Gilbert [name] and look forward to participating in their care.  A copy of this report was sent to the requesting provider on this date.  Electronically Signed: OSHenreitta Cea/20/2017, 10:00 AM   I spent a total of    30 minutes in face to face in clinical consultation, greater than 50% of which was counseling/coordinating care for non-hodgkin's lymphoma, needs BMBX and PAC

## 2016-01-20 NOTE — Sedation Documentation (Signed)
Heparin flush given at 1210

## 2016-01-20 NOTE — Discharge Instructions (Signed)
Bone Marrow Aspiration and Bone Marrow Biopsy °Bone marrow aspiration and bone marrow biopsy are procedures that are done to diagnose blood disorders. You Copelin also have one of these procedures to help diagnose infections or some types of cancer. °Bone marrow is the soft tissue that is inside your bones. Blood cells are produced in bone marrow. For bone marrow aspiration, a sample of tissue in liquid form is removed from inside your bone. For a bone marrow biopsy, a small core of bone marrow tissue is removed. Then these samples are examined under a microscope or tested in a lab. °You Dunnigan need these procedures if you have an abnormal complete blood count (CBC). The aspiration or biopsy sample is usually taken from the top of your hip bone. Sometimes, an aspiration sample is taken from your chest bone (sternum). °LET YOUR HEALTH CARE PROVIDER KNOW ABOUT: °· Any allergies you have. °· All medicines you are taking, including vitamins, herbs, eye drops, creams, and over-the-counter medicines. °· Previous problems you or members of your family have had with the use of anesthetics. °· Any blood disorders you have. °· Previous surgeries you have had. °· Any medical conditions you Mostafa have. °· Whether you are pregnant or you think that you Savin be pregnant. °RISKS AND COMPLICATIONS °Generally, this is a safe procedure. However, problems Liberto occur, including: °· Infection. °· Bleeding. °BEFORE THE PROCEDURE °· Ask your health care provider about: °¨ Changing or stopping your regular medicines. This is especially important if you are taking diabetes medicines or blood thinners. °¨ Taking medicines such as aspirin and ibuprofen. These medicines can thin your blood. Do not take these medicines before your procedure if your health care provider instructs you not to. °· Plan to have someone take you home after the procedure. °· If you go home right after the procedure, plan to have someone with you for 24 hours. °PROCEDURE  °· An  IV tube Schupp be inserted into one of your veins. °· The injection site will be cleaned with a germ-killing solution (antiseptic). °· You will be given one or more of the following: °¨ A medicine that helps you relax (sedative). °¨ A medicine that numbs the area (local anesthetic). °· The bone marrow sample will be removed as follows: °¨ For an aspiration, a hollow needle will be inserted through your skin and into your bone. Bone marrow fluid will be drawn up into a syringe. °¨ For a biopsy, your health care provider will use a hollow needle to remove a core of tissue from your bone marrow. °· The needle will be removed. °· A bandage (dressing) will be placed over the insertion site and taped in place. °The procedure Pyles vary among health care providers and hospitals. °AFTER THE PROCEDURE °· Your blood pressure, heart rate, breathing rate, and blood oxygen level will be monitored often until the medicines you were given have worn off. °· Return to your normal activities as directed by your health care provider. °  °This information is not intended to replace advice given to you by your health care provider. Make sure you discuss any questions you have with your health care provider. °  °Document Released: 07/22/2004 Document Revised: 12/03/2014 Document Reviewed: 07/10/2014 °Elsevier Interactive Patient Education ©2016 Elsevier Inc[employer] °Bone Marrow Aspiration and Bone Marrow Biopsy, Care After °Refer to this sheet in the next few weeks. These instructions provide you with information about caring for yourself after your procedure. Your health care provider Cohoon also give you   more specific instructions. Your treatment has been planned according to current medical practices, but problems sometimes occur. Call your health care provider if you have any problems or questions after your procedure. WHAT TO EXPECT AFTER THE PROCEDURE After your procedure, it is common to have:  Soreness or tenderness around the puncture  site.  Bruising. HOME CARE INSTRUCTIONS  Take medicines only as directed by your health care provider.  Follow your health care provider's instructions about:  Puncture site care.  Bandage (dressing) changes and removal.  Bathe and shower as directed by your health care provider.  Check your puncture site every day for signs of infection. Watch for:  Redness, swelling, or pain.  Fluid, blood, or pus.  Return to your normal activities as directed by your health care provider.  Keep all follow-up visits as directed by your health care provider. This is important. SEEK MEDICAL CARE IF:  You have a fever.  You have uncontrollable bleeding.  You have redness, swelling, or pain at the site of your puncture.  You have fluid, blood, or pus coming from your puncture site.   This information is not intended to replace advice given to you by your health care provider. Make sure you discuss any questions you have with your health care provider.   Document Released: 02/05/2005 Document Revised: 12/03/2014 Document Reviewed: 07/10/2014 Elsevier Interactive Patient Education 2016 [place] An implanted port is a type of central line that is placed under the skin. Central lines are used to provide IV access when treatment or nutrition needs to be given through a person's veins. Implanted ports are used for long-term IV access. An implanted port Burak be placed because:   You need IV medicine that would be irritating to the small veins in your hands or arms.   You need long-term IV medicines, such as antibiotics.   You need IV nutrition for a long period.   You need frequent blood draws for lab tests.   You need dialysis.  Implanted ports are usually placed in the chest area, but they can also be placed in the upper arm, the abdomen, or the leg. An implanted port has two main parts:   Reservoir. The reservoir is round and will appear as a small,  raised area under your skin. The reservoir is the part where a needle is inserted to give medicines or draw blood.   Catheter. The catheter is a thin, flexible tube that extends from the reservoir. The catheter is placed into a large vein. Medicine that is inserted into the reservoir goes into the catheter and then into the vein.  HOW WILL I CARE FOR MY INCISION SITE? Do not get the incision site wet. Bathe or shower as directed by your health care provider.  HOW IS MY PORT ACCESSED? Special steps must be taken to access the port:   Before the port is accessed, a numbing cream can be placed on the skin. This helps numb the skin over the port site.   Your health care provider uses a sterile technique to access the port.  Your health care provider must put on a mask and sterile gloves.  The skin over your port is cleaned carefully with an antiseptic and allowed to dry.  The port is gently pinched between sterile gloves, and a needle is inserted into the port.  Only "non-coring" port needles should be used to access the port. Once the port is accessed, a blood return should  be checked. This helps ensure that the port is in the vein and is not clogged.   If your port needs to remain accessed for a constant infusion, a clear (transparent) bandage will be placed over the needle site. The bandage and needle will need to be changed every week, or as directed by your health care provider.   Keep the bandage covering the needle clean and dry. Do not get it wet. Follow your health care provider's instructions on how to take a shower or bath while the port is accessed.   If your port does not need to stay accessed, no bandage is needed over the port.  WHAT IS FLUSHING? Flushing helps keep the port from getting clogged. Follow your health care provider's instructions on how and when to flush the port. Ports are usually flushed with saline solution or a medicine called heparin. The need for  flushing will depend on how the port is used.   If the port is used for intermittent medicines or blood draws, the port will need to be flushed:   After medicines have been given.   After blood has been drawn.   As part of routine maintenance.   If a constant infusion is running, the port Bisping not need to be flushed.  HOW LONG WILL MY PORT STAY IMPLANTED? The port can stay in for as long as your health care provider thinks it is needed. When it is time for the port to come out, surgery will be done to remove it. The procedure is similar to the one performed when the port was put in.  WHEN SHOULD I SEEK IMMEDIATE MEDICAL CARE? When you have an implanted port, you should seek immediate medical care if:   You notice a bad smell coming from the incision site.   You have swelling, redness, or drainage at the incision site.   You have more swelling or pain at the port site or the surrounding area.   You have a fever that is not controlled with medicine.   This information is not intended to replace advice given to you by your health care provider. Make sure you discuss any questions you have with your health care provider.   Document Released: 07/19/2005 Document Revised: 05/09/2013 Document Reviewed: 03/26/2013 Elsevier Interactive Patient Education 2016 [place] Insertion, Care After Refer to this sheet in the next few weeks. These instructions provide you with information on caring for yourself after your procedure. Your health care provider Soper also give you more specific instructions. Your treatment has been planned according to current medical practices, but problems sometimes occur. Call your health care provider if you have any problems or questions after your procedure. WHAT TO EXPECT AFTER THE PROCEDURE After your procedure, it is typical to have the following:   Discomfort at the port insertion site. Ice packs to the area will help.  Bruising on the  skin over the port. This will subside in 3-4 days. HOME CARE INSTRUCTIONS  After your port is placed, you will get a manufacturer's information card. The card has information about your port. Keep this card with you at all times.   Know what kind of port you have. There are many types of ports available.   Wear a medical alert bracelet in case of an emergency. This can help alert health care workers that you have a port.   The port can stay in for as long as your health care provider believes it is  necessary.   A home health care nurse Counts give medicines and take care of the port.   You or a family member can get special training and directions for giving medicine and taking care of the port at home.  SEEK MEDICAL CARE IF:   Your port does not flush or you are unable to get a blood return.   You have a fever or chills. SEEK IMMEDIATE MEDICAL CARE IF:  You have new fluid or pus coming from your incision.   You notice a bad smell coming from your incision site.   You have swelling, pain, or more redness at the incision or port site.   You have chest pain or shortness of breath.   This information is not intended to replace advice given to you by your health care provider. Make sure you discuss any questions you have with your health care provider.   Document Released: 05/09/2013 Document Revised: 07/24/2013 Document Reviewed: 05/09/2013 Elsevier Interactive Patient Education 2016 Elsevier Inc. Moderate Conscious Sedation, Adult Sedation is the use of medicines to promote relaxation and relieve discomfort and anxiety. Moderate conscious sedation is a type of sedation. Under moderate conscious sedation you are less alert than normal but are still able to respond to instructions or stimulation. Moderate conscious sedation is used during short medical and dental procedures. It is milder than deep sedation or general anesthesia and allows you to return to your regular activities  sooner. LET [facility] CARE PROVIDER KNOW ABOUT:   Any allergies you have.  All medicines you are taking, including vitamins, herbs, eye drops, creams, and over-the-counter medicines.  Use of steroids (by mouth or creams).  Previous problems you or members of your family have had with the use of anesthetics.  Any blood disorders you have.  Previous surgeries you have had.  Medical conditions you have.  Possibility of pregnancy, if this applies.  Use of cigarettes, alcohol, or illegal drugs. RISKS AND COMPLICATIONS Generally, this is a safe procedure. However, as with any procedure, problems can occur. Possible problems include:  Oversedation.  Trouble breathing on your own. You Pownall need to have a breathing tube until you are awake and breathing on your own.  Allergic reaction to any of the medicines used for the procedure. BEFORE THE PROCEDURE  You Rayos have blood tests done. These tests can help show how well your kidneys and liver are working. They can also show how well your blood clots.  A physical exam will be done.  Only take medicines as directed by your health care provider. You Alvizo need to stop taking medicines (such as blood thinners, aspirin, or nonsteroidal anti-inflammatory drugs) before the procedure.   Do not eat or drink at least 6 hours before the procedure or as directed by your health care provider.  Arrange for a responsible adult, family member, or friend to take you home after the procedure. He or she should stay with you for at least 24 hours after the procedure, until the medicine has worn off. PROCEDURE   An intravenous (IV) catheter will be inserted into one of your veins. Medicine will be able to flow directly into your body through this catheter. You Carbon be given medicine through this tube to help prevent pain and help you relax.  The medical or dental procedure will be done. AFTER THE PROCEDURE  You will stay in a recovery area until the  medicine has worn off. Your blood pressure and pulse will be checked.  Depending on the procedure you had, you Pietsch be allowed to go home when you can tolerate liquids and your pain is under control.   This information is not intended to replace advice given to you by your health care provider. Make sure you discuss any questions you have with your health care provider.   Document Released: 04/13/2001 Document Revised: 08/09/2014 Document Reviewed: 03/26/2013 Elsevier Interactive Patient Education [employer].

## 2016-01-20 NOTE — Procedures (Signed)
Interventional Radiology Procedure Note  Procedure: CT guided aspirate and core biopsy of right posterior iliac bone Complications: None Recommendations: - Continue NPO for VIR port catheter placement -  - Follow biopsy results  Signed,  Dulcy Fanny. Earleen Newport, DO

## 2016-01-20 NOTE — Procedures (Signed)
Interventional Radiology Procedure Note  Procedure: Placement of a right IJ approach single lumen PowerPort.  Tip is positioned at the superior cavoatrial junction and catheter is ready for immediate use.  Complications: None Recommendations:  - Ok to shower tomorrow - Do not submerge for 7 days - Routine line care   Signed,  Star Cheese S. Toshie Demelo, DO   

## 2016-01-20 NOTE — Discharge Instructions (Signed)
Needle Biopsy of the Bone, Care After Refer to this sheet in the next few weeks. These instructions provide you with information about caring for yourself after your procedure. Your health care provider Terlecki also give you more specific instructions. Your treatment has been planned according to current medical practices, but problems sometimes occur. Call your health care provider if you have any problems or questions after your procedure. WHAT TO EXPECT AFTER THE PROCEDURE  After your procedure, it is common to have soreness or tenderness at the puncture site. HOME CARE INSTRUCTIONS  Take over-the-counter and prescription medicines only as told by your health care provider.  Bathe and shower as told by your health care provider.  Follow instructions from your health care provider about:  How to take care of your puncture site.  When and how you should change your bandage (dressing).  When you should remove your dressing.  Check your puncture site every day for signs of infection. Watch for:  Redness, swelling, or worsening pain.  Fluid, blood, or pus.  Return to your normal activities as told by your health care provider.  Keep all follow-up visits as told by your health care provider. This is important. SEEK MEDICAL CARE IF:  You have redness, swelling, or worsening pain at the site of your puncture.  You have fluid, blood, or pus coming from your puncture site.  You have a fever.  You have persistent nausea or vomiting. SEEK IMMEDIATE MEDICAL CARE IF:  You develop a rash.  You have difficulty breathing.   This information is not intended to replace advice given to you by your health care provider. Make sure you discuss any questions you have with your health care provider.   Document Released: 02/05/2005 Document Revised: 04/09/2015 Document Reviewed: 08/26/2014 Elsevier Interactive Patient Education 2016 Elsevier Inc. Moderate Conscious Sedation, Adult, Care  After Refer to this sheet in the next few weeks. These instructions provide you with information on caring for yourself after your procedure. Your health care provider Slotnick also give you more specific instructions. Your treatment has been planned according to current medical practices, but problems sometimes occur. Call your health care provider if you have any problems or questions after your procedure. WHAT TO EXPECT AFTER THE PROCEDURE  After your procedure:  You Harpster feel sleepy, clumsy, and have poor balance for several hours.  Vomiting Yates occur if you eat too soon after the procedure. HOME CARE INSTRUCTIONS  Do not participate in any activities where you could become injured for at least 24 hours. Do not:  Drive.  Swim.  Ride a bicycle.  Operate heavy machinery.  Cook.  Use power tools.  Climb ladders.  Work from a high place.  Do not make important decisions or sign legal documents until you are improved.  If you vomit, drink water, juice, or soup when you can drink without vomiting. Make sure you have little or no nausea before eating solid foods.  Only take over-the-counter or prescription medicines for pain, discomfort, or fever as directed by your health care provider.  Make sure you and your family fully understand everything about the medicines given to you, including what side effects Caterino occur.  You should not drink alcohol, take sleeping pills, or take medicines that cause drowsiness for at least 24 hours.  If you smoke, do not smoke without supervision.  If you are feeling better, you Dieppa resume normal activities 24 hours after you were sedated.  Keep all appointments with your health  care provider. SEEK MEDICAL CARE IF:  Your skin is pale or bluish in color.  You continue to feel nauseous or vomit.  Your pain is getting worse and is not helped by medicine.  You have bleeding or swelling.  You are still sleepy or feeling clumsy after 24  hours. SEEK IMMEDIATE MEDICAL CARE IF:  You develop a rash.  You have difficulty breathing.  You develop any type of allergic problem.  You have a fever. MAKE SURE YOU:  Understand these instructions.  Will watch your condition.  Will get help right away if you are not doing well or get worse.   This information is not intended to replace advice given to you by your health care provider. Make sure you discuss any questions you have with your health care provider.   Document Released: 05/09/2013 Document Revised: 08/09/2014 Document Reviewed: 05/09/2013 Elsevier Interactive Patient Education 2016 [place] An implanted port is a type of central line that is placed under the skin. Central lines are used to provide IV access when treatment or nutrition needs to be given through a person's veins. Implanted ports are used for long-term IV access. An implanted port Clauss be placed because:   You need IV medicine that would be irritating to the small veins in your hands or arms.   You need long-term IV medicines, such as antibiotics.   You need IV nutrition for a long period.   You need frequent blood draws for lab tests.   You need dialysis.  Implanted ports are usually placed in the chest area, but they can also be placed in the upper arm, the abdomen, or the leg. An implanted port has two main parts:   Reservoir. The reservoir is round and will appear as a small, raised area under your skin. The reservoir is the part where a needle is inserted to give medicines or draw blood.   Catheter. The catheter is a thin, flexible tube that extends from the reservoir. The catheter is placed into a large vein. Medicine that is inserted into the reservoir goes into the catheter and then into the vein.  HOW WILL I CARE FOR MY INCISION SITE? Do not get the incision site wet. Bathe or shower as directed by your health care provider.  HOW IS MY PORT  ACCESSED? Special steps must be taken to access the port:   Before the port is accessed, a numbing cream can be placed on the skin. This helps numb the skin over the port site.   Your health care provider uses a sterile technique to access the port.  Your health care provider must put on a mask and sterile gloves.  The skin over your port is cleaned carefully with an antiseptic and allowed to dry.  The port is gently pinched between sterile gloves, and a needle is inserted into the port.  Only "non-coring" port needles should be used to access the port. Once the port is accessed, a blood return should be checked. This helps ensure that the port is in the vein and is not clogged.   If your port needs to remain accessed for a constant infusion, a clear (transparent) bandage will be placed over the needle site. The bandage and needle will need to be changed every week, or as directed by your health care provider.   Keep the bandage covering the needle clean and dry. Do not get it wet. Follow your health care provider's instructions on  how to take a shower or bath while the port is accessed.   If your port does not need to stay accessed, no bandage is needed over the port.  WHAT IS FLUSHING? Flushing helps keep the port from getting clogged. Follow your health care provider's instructions on how and when to flush the port. Ports are usually flushed with saline solution or a medicine called heparin. The need for flushing will depend on how the port is used.   If the port is used for intermittent medicines or blood draws, the port will need to be flushed:   After medicines have been given.   After blood has been drawn.   As part of routine maintenance.   If a constant infusion is running, the port Badolato not need to be flushed.  HOW LONG WILL MY PORT STAY IMPLANTED? The port can stay in for as long as your health care provider thinks it is needed. When it is time for the port to  come out, surgery will be done to remove it. The procedure is similar to the one performed when the port was put in.  WHEN SHOULD I SEEK IMMEDIATE MEDICAL CARE? When you have an implanted port, you should seek immediate medical care if:   You notice a bad smell coming from the incision site.   You have swelling, redness, or drainage at the incision site.   You have more swelling or pain at the port site or the surrounding area.   You have a fever that is not controlled with medicine.   This information is not intended to replace advice given to you by your health care provider. Make sure you discuss any questions you have with your health care provider.   Document Released: 07/19/2005 Document Revised: 05/09/2013 Document Reviewed: 03/26/2013 Elsevier Interactive Patient Education [employer].

## 2016-01-22 ENCOUNTER — Other Ambulatory Visit (HOSPITAL_BASED_OUTPATIENT_CLINIC_OR_DEPARTMENT_OTHER): Payer: Medicare Other

## 2016-01-22 ENCOUNTER — Ambulatory Visit (HOSPITAL_BASED_OUTPATIENT_CLINIC_OR_DEPARTMENT_OTHER): Payer: Medicare Other

## 2016-01-22 ENCOUNTER — Other Ambulatory Visit: Payer: Self-pay | Admitting: *Deleted

## 2016-01-22 ENCOUNTER — Encounter: Payer: Self-pay | Admitting: Hematology & Oncology

## 2016-01-22 VITALS — BP 146/60 | HR 61 | Temp 97.5°F | Resp 16

## 2016-01-22 DIAGNOSIS — C641 Malignant neoplasm of right kidney, except renal pelvis: Secondary | ICD-10-CM

## 2016-01-22 DIAGNOSIS — C8580 Other specified types of non-Hodgkin lymphoma, unspecified site: Secondary | ICD-10-CM

## 2016-01-22 DIAGNOSIS — C8330 Diffuse large B-cell lymphoma, unspecified site: Secondary | ICD-10-CM

## 2016-01-22 DIAGNOSIS — C8589 Other specified types of non-Hodgkin lymphoma, extranodal and solid organ sites: Secondary | ICD-10-CM

## 2016-01-22 DIAGNOSIS — C8339 Diffuse large B-cell lymphoma, extranodal and solid organ sites: Secondary | ICD-10-CM

## 2016-01-22 DIAGNOSIS — Z5111 Encounter for antineoplastic chemotherapy: Secondary | ICD-10-CM | POA: Diagnosis not present

## 2016-01-22 DIAGNOSIS — Z5112 Encounter for antineoplastic immunotherapy: Secondary | ICD-10-CM

## 2016-01-22 LAB — CMP (CANCER CENTER ONLY)
ALT(SGPT): 12 U/L (ref 10–47)
AST: 23 U/L (ref 11–38)
Albumin: 2.2 g/dL — ABNORMAL LOW (ref 3.3–5.5)
Alkaline Phosphatase: 120 U/L — ABNORMAL HIGH (ref 26–84)
BUN, Bld: 15 mg/dL (ref 7–22)
CO2: 29 meq/L (ref 18–33)
Calcium: 9.4 mg/dL (ref 8.0–10.3)
Chloride: 102 meq/L (ref 98–108)
Creat: 1.5 mg/dL — ABNORMAL HIGH (ref 0.6–1.2)
Glucose, Bld: 110 mg/dL (ref 73–118)
Potassium: 3.4 meq/L (ref 3.3–4.7)
Sodium: 134 meq/L (ref 128–145)
Total Bilirubin: 0.5 mg/dL (ref 0.20–1.60)
Total Protein: 6.3 g/dL — ABNORMAL LOW (ref 6.4–8.1)

## 2016-01-22 LAB — CBC WITH DIFFERENTIAL (CANCER CENTER ONLY)
BASO#: 0 10e3/uL (ref 0.0–0.2)
BASO%: 0.3 % (ref 0.0–2.0)
EOS%: 3.8 % (ref 0.0–7.0)
Eosinophils Absolute: 0.4 10e3/uL (ref 0.0–0.5)
HCT: 30.4 % — ABNORMAL LOW (ref 38.7–49.9)
HGB: 10.4 g/dL — ABNORMAL LOW (ref 13.0–17.1)
LYMPH#: 1 10e3/uL (ref 0.9–3.3)
LYMPH%: 9.3 % — ABNORMAL LOW (ref 14.0–48.0)
MCH: 28.4 pg (ref 28.0–33.4)
MCHC: 34.2 g/dL (ref 32.0–35.9)
MCV: 83 fL (ref 82–98)
MONO#: 0.7 10e3/uL (ref 0.1–0.9)
MONO%: 6.3 % (ref 0.0–13.0)
NEUT#: 8.4 10e3/uL — ABNORMAL HIGH (ref 1.5–6.5)
NEUT%: 80.3 % — ABNORMAL HIGH (ref 40.0–80.0)
Platelets: 214 10e3/uL (ref 145–400)
RBC: 3.66 10e6/uL — ABNORMAL LOW (ref 4.20–5.70)
RDW: 13.9 % (ref 11.1–15.7)
WBC: 10.5 10e3/uL — ABNORMAL HIGH (ref 4.0–10.0)

## 2016-01-22 MED ORDER — ONDANSETRON HCL 8 MG PO TABS: 8.0000 mg | ORAL_TABLET | Freq: Two times a day (BID) | ORAL | Status: DC | PRN

## 2016-01-22 MED ORDER — DENOSUMAB 120 MG/1.7ML ~~LOC~~ SOLN
120.0000 mg | Freq: Once | SUBCUTANEOUS | Status: AC
  Administered 2016-01-22: 120 mg via SUBCUTANEOUS
  Filled 2016-01-22: qty 1.7

## 2016-01-22 MED ORDER — ACETAMINOPHEN 325 MG PO TABS
ORAL_TABLET | ORAL | Status: AC
  Filled 2016-01-22: qty 2

## 2016-01-22 MED ORDER — SODIUM CHLORIDE 0.9% FLUSH
10.0000 mL | INTRAVENOUS | Status: DC | PRN
Start: 2016-01-22 — End: 2016-01-23
  Administered 2016-01-22: 10 mL
  Filled 2016-01-22: qty 10

## 2016-01-22 MED ORDER — PALONOSETRON HCL INJECTION 0.25 MG/5ML
INTRAVENOUS | Status: AC
  Filled 2016-01-22: qty 5

## 2016-01-22 MED ORDER — SODIUM CHLORIDE 0.9 % IV SOLN
10.0000 mg | Freq: Once | INTRAVENOUS | Status: AC
  Administered 2016-01-22: 10 mg via INTRAVENOUS
  Filled 2016-01-22: qty 1

## 2016-01-22 MED ORDER — DIPHENHYDRAMINE HCL 25 MG PO CAPS
ORAL_CAPSULE | ORAL | Status: AC
  Filled 2016-01-22: qty 2

## 2016-01-22 MED ORDER — ALLOPURINOL 300 MG PO TABS
300.0000 mg | ORAL_TABLET | Freq: Every day | ORAL | Status: DC
Start: 2016-01-22 — End: 2016-03-24

## 2016-01-22 MED ORDER — DIPHENHYDRAMINE HCL 25 MG PO CAPS
50.0000 mg | ORAL_CAPSULE | Freq: Once | ORAL | Status: AC
  Administered 2016-01-22: 50 mg via ORAL

## 2016-01-22 MED ORDER — ETOPOSIDE CHEMO INJECTION 1 GM/50ML
50.0000 mg/m2 | Freq: Once | INTRAVENOUS | Status: AC
  Administered 2016-01-22: 90 mg via INTRAVENOUS
  Filled 2016-01-22: qty 4.5

## 2016-01-22 MED ORDER — PREDNISONE 20 MG PO TABS
60.0000 mg | ORAL_TABLET | Freq: Every day | ORAL | Status: DC
Start: 2016-01-22 — End: 2016-02-12

## 2016-01-22 MED ORDER — LIDOCAINE-PRILOCAINE 2.5-2.5 % EX CREA: TOPICAL_CREAM | CUTANEOUS | Status: DC

## 2016-01-22 MED ORDER — VINCRISTINE SULFATE CHEMO INJECTION 1 MG/ML
2.0000 mg | Freq: Once | INTRAVENOUS | Status: AC
  Administered 2016-01-22: 2 mg via INTRAVENOUS
  Filled 2016-01-22: qty 2

## 2016-01-22 MED ORDER — RITUXIMAB CHEMO INJECTION 500 MG/50ML
375.0000 mg/m2 | Freq: Once | INTRAVENOUS | Status: AC
  Administered 2016-01-22: 700 mg via INTRAVENOUS
  Filled 2016-01-22: qty 20

## 2016-01-22 MED ORDER — SODIUM CHLORIDE 0.9 % IV SOLN
750.0000 mg/m2 | Freq: Once | INTRAVENOUS | Status: AC
  Administered 2016-01-22: 1420 mg via INTRAVENOUS
  Filled 2016-01-22: qty 71

## 2016-01-22 MED ORDER — HEPARIN SOD (PORK) LOCK FLUSH 100 UNIT/ML IV SOLN
500.0000 [IU] | Freq: Once | INTRAVENOUS | Status: AC | PRN
  Administered 2016-01-22: 500 [IU]
  Filled 2016-01-22: qty 5

## 2016-01-22 MED ORDER — SODIUM CHLORIDE 0.9 % IV SOLN
Freq: Once | INTRAVENOUS | Status: AC
  Administered 2016-01-22: 09:00:00 via INTRAVENOUS

## 2016-01-22 MED ORDER — PROCHLORPERAZINE MALEATE 10 MG PO TABS: 10.0000 mg | ORAL_TABLET | Freq: Four times a day (QID) | ORAL | Status: DC | PRN

## 2016-01-22 MED ORDER — PALONOSETRON HCL INJECTION 0.25 MG/5ML
0.2500 mg | Freq: Once | INTRAVENOUS | Status: AC
  Administered 2016-01-22: 0.25 mg via INTRAVENOUS

## 2016-01-22 MED ORDER — ACETAMINOPHEN 325 MG PO TABS
650.0000 mg | ORAL_TABLET | Freq: Once | ORAL | Status: AC
  Administered 2016-01-22: 650 mg via ORAL

## 2016-01-22 NOTE — Patient Instructions (Signed)
[facility] Discharge Instructions for Patients Receiving Chemotherapy  Today you received the following chemotherapy agents Etoposide, Rituxan Cytoxan and Oncovin  Take home medications for Nausea:  1) Ondansetron (ZOFRAN) 8 MG tablet      Take 1 tablet (8 mg total) by mouth 2 (two) times daily. Start the day after chemo for 3 days. Then take as needed for nausea or vomiting.        2) PredniSONE (DELTASONE) 20 MG tablet     Take 3 tablets (60 mg total) by mouth daily. Take on days 1-5 of chemotherapy.         3) Prochlorperazine (COMPAZINE) 10 MG tablet      Take 1 tablet (10 mg total) by mouth every 6 (six) hours as needed (Nausea or vomiting).            To help prevent nausea and vomiting after your treatment, we encourage you to take your nausea medication   If you develop nausea and vomiting that is not controlled by your nausea medication, call the clinic. If it is after clinic hours your family physician or the after hours number for the clinic or go to the Emergency Department.   BELOW ARE SYMPTOMS THAT SHOULD BE REPORTED IMMEDIATELY:  *FEVER GREATER THAN 100.5 F  *CHILLS WITH OR WITHOUT FEVER  NAUSEA AND VOMITING THAT IS NOT CONTROLLED WITH YOUR NAUSEA MEDICATION  *UNUSUAL SHORTNESS OF BREATH  *UNUSUAL BRUISING OR BLEEDING  TENDERNESS IN MOUTH AND THROAT WITH OR WITHOUT PRESENCE OF ULCERS  *URINARY PROBLEMS  *BOWEL PROBLEMS  UNUSUAL RASH Items with * indicate a potential emergency and should be followed up as soon as possible.  One of the nurses will contact you 24 hours after your treatment. Please let the nurse know about any problems that you Masri have experienced. Feel free to call the clinic you have any questions or concerns. The clinic phone number is [phone]   I have been informed and understand all the instructions given to me. I know to contact the clinic, my physician, or go to the Emergency Department if any problems should  occur. I do not have any questions at this time, but understand that I Gilbert [name] call the clinic during office hours   should I have any questions or need assistance in obtaining follow up care.    __________________________________________  _____________  __________ Signature of Patient or Authorized Representative            Date                   Time    __________________________________________ Nurse's Signature

## 2016-01-23 ENCOUNTER — Telehealth: Payer: Self-pay

## 2016-01-23 ENCOUNTER — Ambulatory Visit (HOSPITAL_BASED_OUTPATIENT_CLINIC_OR_DEPARTMENT_OTHER): Payer: Medicare Other

## 2016-01-23 ENCOUNTER — Other Ambulatory Visit: Payer: Self-pay | Admitting: Hematology & Oncology

## 2016-01-23 VITALS — BP 159/64 | HR 62 | Temp 98.1°F | Resp 16

## 2016-01-23 DIAGNOSIS — C8589 Other specified types of non-Hodgkin lymphoma, extranodal and solid organ sites: Secondary | ICD-10-CM

## 2016-01-23 DIAGNOSIS — C8339 Diffuse large B-cell lymphoma, extranodal and solid organ sites: Secondary | ICD-10-CM | POA: Diagnosis not present

## 2016-01-23 DIAGNOSIS — C8330 Diffuse large B-cell lymphoma, unspecified site: Secondary | ICD-10-CM

## 2016-01-23 DIAGNOSIS — Z5111 Encounter for antineoplastic chemotherapy: Secondary | ICD-10-CM | POA: Diagnosis not present

## 2016-01-23 DIAGNOSIS — C8580 Other specified types of non-Hodgkin lymphoma, unspecified site: Secondary | ICD-10-CM

## 2016-01-23 MED ORDER — ALTEPLASE 2 MG IJ SOLR
2.0000 mg | Freq: Once | INTRAMUSCULAR | Status: DC | PRN
  Filled 2016-01-23: qty 2

## 2016-01-23 MED ORDER — SODIUM CHLORIDE 0.9% FLUSH
3.0000 mL | INTRAVENOUS | Status: DC | PRN
  Filled 2016-01-23: qty 10

## 2016-01-23 MED ORDER — HEPARIN SOD (PORK) LOCK FLUSH 100 UNIT/ML IV SOLN
500.0000 [IU] | Freq: Once | INTRAVENOUS | Status: AC | PRN
  Administered 2016-01-23: 500 [IU]
  Filled 2016-01-23: qty 5

## 2016-01-23 MED ORDER — PROCHLORPERAZINE MALEATE 10 MG PO TABS: 10.0000 mg | ORAL_TABLET | Freq: Once | ORAL | Status: DC

## 2016-01-23 MED ORDER — SODIUM CHLORIDE 0.9 % IV SOLN
Freq: Once | INTRAVENOUS | Status: AC
  Administered 2016-01-23: 13:00:00 via INTRAVENOUS

## 2016-01-23 MED ORDER — SODIUM CHLORIDE 0.9 % IV SOLN: Freq: Once | INTRAVENOUS | Status: DC

## 2016-01-23 MED ORDER — SODIUM CHLORIDE 0.9 % IV SOLN
50.0000 mg/m2 | Freq: Once | INTRAVENOUS | Status: AC
  Administered 2016-01-23: 90 mg via INTRAVENOUS
  Filled 2016-01-23: qty 4.5

## 2016-01-23 MED ORDER — SODIUM CHLORIDE 0.9% FLUSH
10.0000 mL | INTRAVENOUS | Status: DC | PRN
  Administered 2016-01-23: 10 mL
  Filled 2016-01-23: qty 10

## 2016-01-23 MED ORDER — HEPARIN SOD (PORK) LOCK FLUSH 100 UNIT/ML IV SOLN
250.0000 [IU] | Freq: Once | INTRAVENOUS | Status: DC | PRN
  Filled 2016-01-23: qty 5

## 2016-01-23 MED ORDER — SODIUM CHLORIDE 0.9 % IV SOLN
150.0000 mg | Freq: Once | INTRAVENOUS | Status: AC
  Administered 2016-01-23: 150 mg via INTRAVENOUS
  Filled 2016-01-23: qty 5

## 2016-01-23 NOTE — Telephone Encounter (Signed)
Patient was here today and talk to him about how he did with first treatment. Patient complaint of nausea and vomiting. Pt was started on Emed today hopefully this will work for him

## 2016-01-23 NOTE — Patient Instructions (Signed)
[facility] Discharge Instructions for Patients Receiving Chemotherapy  Today you received the following chemotherapy agents Etoposide  Take home medications for Nausea:  1) Ondansetron (ZOFRAN) 8 MG tablet      Take 1 tablet (8 mg total) by mouth 2 (two) times daily. Start the day after chemo for 3 days. Then take as needed for nausea or vomiting.        2) PredniSONE (DELTASONE) 20 MG tablet     Take 3 tablets (60 mg total) by mouth daily. Take on days 1-5 of chemotherapy.         3) Prochlorperazine (COMPAZINE) 10 MG tablet      Take 1 tablet (10 mg total) by mouth every 6 (six) hours as needed (Nausea or vomiting).            To help prevent nausea and vomiting after your treatment, we encourage you to take your nausea medication   If you develop nausea and vomiting that is not controlled by your nausea medication, call the clinic. If it is after clinic hours your family physician or the after hours number for the clinic or go to the Emergency Department.   BELOW ARE SYMPTOMS THAT SHOULD BE REPORTED IMMEDIATELY:  *FEVER GREATER THAN 100.5 F  *CHILLS WITH OR WITHOUT FEVER  NAUSEA AND VOMITING THAT IS NOT CONTROLLED WITH YOUR NAUSEA MEDICATION  *UNUSUAL SHORTNESS OF BREATH  *UNUSUAL BRUISING OR BLEEDING  TENDERNESS IN MOUTH AND THROAT WITH OR WITHOUT PRESENCE OF ULCERS  *URINARY PROBLEMS  *BOWEL PROBLEMS  UNUSUAL RASH Items with * indicate a potential emergency and should be followed up as soon as possible.  One of the nurses will contact you 24 hours after your treatment. Please let the nurse know about any problems that you [name] have experienced. Feel free to call the clinic you have any questions or concerns. The clinic phone number is [phone]   I have been informed and understand all the instructions given to me. I know to contact the clinic, my physician, or go to the Emergency Department if any problems should occur. I do not have any  questions at this time, but understand that I [name] call the clinic during office hours   should I have any questions or need assistance in obtaining follow up care.    __________________________________________  _____________  __________ Signature of Patient or Authorized Representative            Date                   Time    __________________________________________ Nurse's Signature

## 2016-01-27 LAB — TISSUE HYBRIDIZATION (BONE MARROW)-NCBH

## 2016-01-27 LAB — CHROMOSOME ANALYSIS, BONE MARROW

## 2016-01-28 ENCOUNTER — Encounter: Payer: Self-pay | Admitting: Hematology & Oncology

## 2016-01-29 ENCOUNTER — Encounter: Payer: Self-pay | Admitting: Hematology & Oncology

## 2016-02-02 ENCOUNTER — Encounter (HOSPITAL_COMMUNITY): Payer: Self-pay

## 2016-02-05 ENCOUNTER — Other Ambulatory Visit: Payer: Medicare Other

## 2016-02-05 ENCOUNTER — Ambulatory Visit: Payer: Medicare Other | Admitting: Hematology & Oncology

## 2016-02-11 ENCOUNTER — Encounter: Payer: Self-pay | Admitting: Family

## 2016-02-11 ENCOUNTER — Ambulatory Visit (HOSPITAL_BASED_OUTPATIENT_CLINIC_OR_DEPARTMENT_OTHER): Payer: Medicare Other

## 2016-02-11 ENCOUNTER — Other Ambulatory Visit (HOSPITAL_BASED_OUTPATIENT_CLINIC_OR_DEPARTMENT_OTHER): Payer: Medicare Other

## 2016-02-11 ENCOUNTER — Ambulatory Visit (HOSPITAL_BASED_OUTPATIENT_CLINIC_OR_DEPARTMENT_OTHER): Payer: Medicare Other | Admitting: Family

## 2016-02-11 ENCOUNTER — Other Ambulatory Visit: Payer: Self-pay | Admitting: Hematology & Oncology

## 2016-02-11 VITALS — BP 127/67 | HR 53 | Temp 98.0°F | Resp 16 | Ht 70.0 in | Wt 152.0 lb

## 2016-02-11 VITALS — BP 105/63 | HR 58 | Temp 98.2°F | Resp 16

## 2016-02-11 DIAGNOSIS — Z5112 Encounter for antineoplastic immunotherapy: Secondary | ICD-10-CM | POA: Diagnosis not present

## 2016-02-11 DIAGNOSIS — Z5111 Encounter for antineoplastic chemotherapy: Secondary | ICD-10-CM | POA: Diagnosis not present

## 2016-02-11 DIAGNOSIS — C8330 Diffuse large B-cell lymphoma, unspecified site: Secondary | ICD-10-CM

## 2016-02-11 DIAGNOSIS — C641 Malignant neoplasm of right kidney, except renal pelvis: Secondary | ICD-10-CM

## 2016-02-11 DIAGNOSIS — C8589 Other specified types of non-Hodgkin lymphoma, extranodal and solid organ sites: Secondary | ICD-10-CM

## 2016-02-11 DIAGNOSIS — C8339 Diffuse large B-cell lymphoma, extranodal and solid organ sites: Secondary | ICD-10-CM | POA: Diagnosis not present

## 2016-02-11 DIAGNOSIS — C8580 Other specified types of non-Hodgkin lymphoma, unspecified site: Principal | ICD-10-CM

## 2016-02-11 LAB — CBC WITH DIFFERENTIAL (CANCER CENTER ONLY)
BASO#: 0 10*3/uL (ref 0.0–0.2)
BASO%: 0.3 % (ref 0.0–2.0)
EOS%: 1.4 % (ref 0.0–7.0)
Eosinophils Absolute: 0.1 10*3/uL (ref 0.0–0.5)
HCT: 29.5 % — ABNORMAL LOW (ref 38.7–49.9)
HGB: 10.1 g/dL — ABNORMAL LOW (ref 13.0–17.1)
LYMPH#: 0.7 10*3/uL — ABNORMAL LOW (ref 0.9–3.3)
LYMPH%: 10.2 % — AB (ref 14.0–48.0)
MCH: 27.8 pg — ABNORMAL LOW (ref 28.0–33.4)
MCHC: 34.2 g/dL (ref 32.0–35.9)
MCV: 81 fL — AB (ref 82–98)
MONO#: 0.7 10*3/uL (ref 0.1–0.9)
MONO%: 10.7 % (ref 0.0–13.0)
NEUT#: 5.1 10*3/uL (ref 1.5–6.5)
NEUT%: 77.4 % (ref 40.0–80.0)
PLATELETS: 216 10*3/uL (ref 145–400)
RBC: 3.63 10*6/uL — AB (ref 4.20–5.70)
RDW: 14.9 % (ref 11.1–15.7)
WBC: 6.5 10*3/uL (ref 4.0–10.0)

## 2016-02-11 LAB — CMP (CANCER CENTER ONLY)
ALK PHOS: 114 U/L — AB (ref 26–84)
ALT: 11 U/L (ref 10–47)
AST: 19 U/L (ref 11–38)
Albumin: 2.6 g/dL — ABNORMAL LOW (ref 3.3–5.5)
BILIRUBIN TOTAL: 0.6 mg/dL (ref 0.20–1.60)
BUN: 12 mg/dL (ref 7–22)
CHLORIDE: 104 meq/L (ref 98–108)
CO2: 25 mEq/L (ref 18–33)
CREATININE: 1.2 mg/dL (ref 0.6–1.2)
Calcium: 8.5 mg/dL (ref 8.0–10.3)
Glucose, Bld: 118 mg/dL (ref 73–118)
Potassium: 3.4 mEq/L (ref 3.3–4.7)
SODIUM: 134 meq/L (ref 128–145)
TOTAL PROTEIN: 6.6 g/dL (ref 6.4–8.1)

## 2016-02-11 LAB — LACTATE DEHYDROGENASE: LDH: 184 U/L (ref 125–245)

## 2016-02-11 MED ORDER — SODIUM CHLORIDE 0.9 % IV SOLN
375.0000 mg/m2 | Freq: Once | INTRAVENOUS | Status: AC
  Administered 2016-02-11: 700 mg via INTRAVENOUS
  Filled 2016-02-11: qty 60

## 2016-02-11 MED ORDER — SODIUM CHLORIDE 0.9 % IV SOLN
50.0000 mg/m2 | Freq: Once | INTRAVENOUS | Status: AC
  Administered 2016-02-11: 90 mg via INTRAVENOUS
  Filled 2016-02-11: qty 4.5

## 2016-02-11 MED ORDER — LORAZEPAM 0.5 MG PO TABS: 0.5000 mg | ORAL_TABLET | Freq: Three times a day (TID) | ORAL | Status: DC

## 2016-02-11 MED ORDER — DIPHENHYDRAMINE HCL 25 MG PO CAPS
50.0000 mg | ORAL_CAPSULE | Freq: Once | ORAL | Status: AC
  Administered 2016-02-11: 50 mg via ORAL

## 2016-02-11 MED ORDER — DIPHENHYDRAMINE HCL 25 MG PO CAPS
ORAL_CAPSULE | ORAL | Status: AC
  Filled 2016-02-11: qty 2

## 2016-02-11 MED ORDER — ACETAMINOPHEN 325 MG PO TABS
ORAL_TABLET | ORAL | Status: AC
  Filled 2016-02-11: qty 2

## 2016-02-11 MED ORDER — SODIUM CHLORIDE 0.9% FLUSH
10.0000 mL | INTRAVENOUS | Status: DC | PRN
  Administered 2016-02-11: 10 mL
  Filled 2016-02-11: qty 10

## 2016-02-11 MED ORDER — PALONOSETRON HCL INJECTION 0.25 MG/5ML
INTRAVENOUS | Status: AC
  Filled 2016-02-11: qty 5

## 2016-02-11 MED ORDER — VINCRISTINE SULFATE CHEMO INJECTION 1 MG/ML
2.0000 mg | Freq: Once | INTRAVENOUS | Status: AC
  Administered 2016-02-11: 2 mg via INTRAVENOUS
  Filled 2016-02-11: qty 2

## 2016-02-11 MED ORDER — SODIUM CHLORIDE 0.9 % IV SOLN
Freq: Once | INTRAVENOUS | Status: AC
  Administered 2016-02-11: 09:00:00 via INTRAVENOUS

## 2016-02-11 MED ORDER — ACETAMINOPHEN 325 MG PO TABS
650.0000 mg | ORAL_TABLET | Freq: Once | ORAL | Status: AC
  Administered 2016-02-11: 650 mg via ORAL

## 2016-02-11 MED ORDER — PALONOSETRON HCL INJECTION 0.25 MG/5ML
0.2500 mg | Freq: Once | INTRAVENOUS | Status: AC
Start: 2016-02-11 — End: 2016-02-11
  Administered 2016-02-11: 0.25 mg via INTRAVENOUS

## 2016-02-11 MED ORDER — FOSAPREPITANT DIMEGLUMINE INJECTION 150 MG
Freq: Once | INTRAVENOUS | Status: AC
  Administered 2016-02-11: 10:00:00 via INTRAVENOUS
  Filled 2016-02-11: qty 5

## 2016-02-11 MED ORDER — HEPARIN SOD (PORK) LOCK FLUSH 100 UNIT/ML IV SOLN
500.0000 [IU] | Freq: Once | INTRAVENOUS | Status: AC | PRN
  Administered 2016-02-11: 500 [IU]
  Filled 2016-02-11: qty 5

## 2016-02-11 MED ORDER — SODIUM CHLORIDE 0.9 % IV SOLN
750.0000 mg/m2 | Freq: Once | INTRAVENOUS | Status: AC
  Administered 2016-02-11: 1420 mg via INTRAVENOUS
  Filled 2016-02-11: qty 71

## 2016-02-11 MED FILL — LORazepam 0.5 MG TABS: 0.5 | 20 days supply | Qty: 60 | Fill #0

## 2016-02-11 NOTE — Patient Instructions (Signed)
[facility] Discharge Instructions for Patients Receiving Chemotherapy   Today you received the following chemotherapy agents Rituxan, Cytoxan, Vincristine, Etoposide   To help prevent nausea and vomiting after your treatment, we encourage you to take your nausea medication    1) Zofran (Ondansetron) 8 mg.  Take 1 tablet by mouth 2 times daily as needed for nausea and vomiting.  Start on Day 3 (Saturday, July 15).  2) Compazine 10 mg.  Take 1 tablet by mouth every 6 hours as needed for nausea and vomiting.  3) Lorazepam ( Ativan) Take 1 tablet by mouth every 8 hours as needed for nausea/vomiting, anxiety or sleep.  4)Prednisone Take 3 tablets by mouth daily.  Take on days 2-6 of chemotherapy   DO NOT TAKE YOUR BLOOD PRESSURE MEDICATION ON DAY OF CHEMOTHERAPY.   If you develop nausea and vomiting that is not controlled by your nausea medication, call the clinic.   BELOW ARE SYMPTOMS THAT SHOULD BE REPORTED IMMEDIATELY:  *FEVER GREATER THAN 100.5 F  *CHILLS WITH OR WITHOUT FEVER  NAUSEA AND VOMITING THAT IS NOT CONTROLLED WITH YOUR NAUSEA MEDICATION  *UNUSUAL SHORTNESS OF BREATH  *UNUSUAL BRUISING OR BLEEDING  TENDERNESS IN MOUTH AND THROAT WITH OR WITHOUT PRESENCE OF ULCERS  *URINARY PROBLEMS  *BOWEL PROBLEMS  UNUSUAL RASH Items with * indicate a potential emergency and should be followed up as soon as possible.  Feel free to call the clinic you have any questions or concerns. The clinic phone number is [phone].  Please show the [facility] at check-in to the Emergency Department and triage nurse.

## 2016-02-11 NOTE — Progress Notes (Signed)
Hematology and Oncology Follow Up Visit  Gilbert [name] [id number] 1950/08/05 65 y.o. 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 He has been having nausea and vomiting off and on. He has been taking his zofran and compazine when needed. He had n/v last night and was unable to sleep well.  His appetite is down with the nausea but he is trying to eat. His weight is down 6 lbs since his last visit. He is drinking fluids.  His [facility] scan in June showed an EF of 42% No lymphadenopathy found on exam. No episodes of bleeding or bruising.  No fever, chills, cough, rash, dizziness, SOB, chest pain, palpitations, abdominal pain or changes in bowel or bladder habits.  No swelling, tenderness, numbness or tingling in his extremities. No c/o joint aches or pains.  He is staying busy working and golfing.   Medications:    Medication List       This list is accurate as of: 02/11/16  8:32 AM.  Always use your most recent med list.               allopurinol 300 MG tablet  Commonly known as:  ZYLOPRIM  Take 1 tablet (300 mg total) by mouth daily.     amLODipine 10 MG tablet  Commonly known as:  NORVASC  Take 10 mg by mouth daily.     aspirin EC 81 MG tablet  Take 1 tablet (81 mg total) by mouth daily.     atorvastatin 80 MG tablet  Commonly known as:  LIPITOR  Take 80 mg by mouth daily.     B-12 5000 MCG Caps  Take 5,000 mcg by mouth every morning.     carvedilol 12.5 MG tablet  Commonly known as:  COREG  Take 12.5 mg by mouth 2 (two) times daily with a meal.     clopidogrel 75 MG tablet  Commonly known as:  PLAVIX     cyclobenzaprine 10 MG tablet  Commonly known as:  FLEXERIL  Take 1 tablet (10 mg total) by mouth 3 (three) times  daily as needed for muscle spasms.     lidocaine-prilocaine cream  Commonly known as:  EMLA  Apply to affected area as needed     lidocaine-prilocaine cream  Commonly known as:  EMLA  Apply to affected area once     METAMUCIL PO  Take 3 capsules by mouth every evening. Reported on 10/24/2015     ondansetron 8 MG tablet  Commonly known as:  ZOFRAN  Take 1 tablet (8 mg total) by mouth 2 (two) times daily as needed for refractory nausea / vomiting. Start on day 3 after cyclophosphamide chemotherapy.     ondansetron 8 MG tablet  Commonly known as:  ZOFRAN  Take 1 tablet (8 mg total) by mouth 2 (two) times daily as needed for refractory nausea / vomiting. Start on day 3 after cyclophosphamide chemotherapy.     PHENYTEK 300 MG ER capsule  Generic drug:  phenytoin  Take 1 capsule (300 mg total) by mouth every morning.     polyethylene glycol packet  Commonly known as:  MIRALAX / GLYCOLAX  Take 17 g by mouth daily.  predniSONE 20 MG tablet  Commonly known as:  DELTASONE  Take 3 tablets (60 mg total) by mouth daily. Take on days 1-5 of chemotherapy.     prochlorperazine 10 MG tablet  Commonly known as:  COMPAZINE  Take 1 tablet (10 mg total) by mouth every 6 (six) hours as needed (Nausea or vomiting).     prochlorperazine 10 MG tablet  Commonly known as:  COMPAZINE  Take 1 tablet (10 mg total) by mouth every 6 (six) hours as needed (Nausea or vomiting).     pyridOXINE 100 MG tablet  Commonly known as:  VITAMIN B-6  Take 100 mg by mouth every morning.     traMADol 50 MG tablet  Commonly known as:  ULTRAM  Take 50 mg by mouth every 6 (six) hours as needed for moderate pain or severe pain.     valsartan 160 MG tablet  Commonly known as:  DIOVAN  Take 160 mg by mouth every morning. Reported on 11/05/2015     vitamin B-1 250 MG tablet  Take 250 mg by mouth every morning.        Allergies:  Allergies  Allergen Reactions  . Oxycodone Swelling and Other (See Comments)     Tongue and lip    Past Medical History, Surgical history, Social history, and Family History were reviewed and updated.  Review of Systems: All other 10 point review of systems is negative.   Physical Exam:  vitals were not taken for this visit.  Wt Readings from Last 3 Encounters:  01/08/16 158 lb (71.668 kg)  12/10/15 159 lb (72.122 kg)  11/05/15 163 lb (73.936 kg)    Ocular: Sclerae unicteric, pupils equal, round and reactive to light Ear-nose-throat: Oropharynx clear, dentition fair Lymphatic: No cervical supraclavicular or axillary adenopathy Lungs no rales or rhonchi, good excursion bilaterally Heart regular rate and rhythm, no murmur appreciated Abd soft, nontender, positive bowel sounds, no liver or spleen tip palpated on exam, no fluid wave MSK no focal spinal tenderness, no joint edema Neuro: non-focal, well-oriented, appropriate affect Breasts: Deferred  Lab Results  Component Value Date   WBC 6.5 02/11/2016   HGB 10.1* 02/11/2016   HCT 29.5* 02/11/2016   MCV 81* 02/11/2016   PLT 216 02/11/2016   No results found for: FERRITIN, IRON, TIBC, UIBC, IRONPCTSAT Lab Results  Component Value Date   RBC 3.63* 02/11/2016   No results found for: KPAFRELGTCHN, LAMBDASER, KAPLAMBRATIO No results found for: IGGSERUM, IGA, IGMSERUM No results found for: Odetta Pink, SPEI   Chemistry      Component Value Date/Time   NA 134 01/22/2016 0806   NA 137 01/20/2016 0905   K 3.4 01/22/2016 0806   K 3.9 01/20/2016 0905   CL 102 01/22/2016 0806   CL 105 01/20/2016 0905   CO2 29 01/22/2016 0806   CO2 22 01/20/2016 0905   BUN 15 01/22/2016 0806   BUN 14 01/20/2016 0905   CREATININE 1.5* 01/22/2016 0806   CREATININE 1.26* 01/20/2016 0905      Component Value Date/Time   CALCIUM 9.4 01/22/2016 0806   CALCIUM 8.7* 01/20/2016 0905   ALKPHOS 120* 01/22/2016 0806   ALKPHOS 136* 10/23/2015 1256   AST 23 01/22/2016 0806    AST 17 10/23/2015 1256   ALT 12 01/22/2016 0806   ALT 12* 10/23/2015 1256   BILITOT 0.50 01/22/2016 0806   BILITOT 0.8 10/23/2015 1256     Impression and Plan: Gilbert [name] is  a pleasant 65 yo white male with history of clear cell carcinoma right kidney resected back in March 2015. He has now been diagnosed with diffuse large cell non-Hodgkin's lymphoma. Cytogenetics were negative for "double hit" lymphoma.  MUGA showed an EF of 42%. He is now being treated with R-CEOP and doing fairly well. He has had some breakthrough nausea and vomiting.  He will try taking ativan as needed for nausea and see if this gives him some relief.  We will write out in detail how he is to take his decadron, antiemetics and also his BP medication on the first day of treatment (Rituxan).  We will repeat his PET scan in 3 weeks the same week we see him back for repeat labs and follow-up.  Both he and his daughters know to contact our office with any questions or concerns.   Eliezer Bottom, NP 7/12/20178:32 AM

## 2016-02-12 ENCOUNTER — Encounter (HOSPITAL_COMMUNITY): Payer: Self-pay | Admitting: Emergency Medicine

## 2016-02-12 ENCOUNTER — Ambulatory Visit (HOSPITAL_BASED_OUTPATIENT_CLINIC_OR_DEPARTMENT_OTHER): Payer: Medicare Other

## 2016-02-12 ENCOUNTER — Ambulatory Visit: Payer: Medicare Other | Admitting: Family

## 2016-02-12 ENCOUNTER — Other Ambulatory Visit: Payer: Self-pay | Admitting: Family

## 2016-02-12 ENCOUNTER — Emergency Department (HOSPITAL_COMMUNITY): Payer: Medicare Other

## 2016-02-12 ENCOUNTER — Other Ambulatory Visit: Payer: Medicare Other

## 2016-02-12 ENCOUNTER — Emergency Department (HOSPITAL_COMMUNITY)
Admission: EM | Admit: 2016-02-12 | Discharge: 2016-02-12 | Disposition: A | Discharge status: Home / Self Care | Payer: Medicare Other | Attending: Emergency Medicine | Admitting: Emergency Medicine

## 2016-02-12 VITALS — BP 132/63 | HR 66 | Temp 98.0°F | Resp 18

## 2016-02-12 DIAGNOSIS — Z7982 Long term (current) use of aspirin: Secondary | ICD-10-CM | POA: Insufficient documentation

## 2016-02-12 DIAGNOSIS — R410 Disorientation, unspecified: Secondary | ICD-10-CM | POA: Diagnosis not present

## 2016-02-12 DIAGNOSIS — R2681 Unsteadiness on feet: Secondary | ICD-10-CM

## 2016-02-12 DIAGNOSIS — Z87891 Personal history of nicotine dependence: Secondary | ICD-10-CM | POA: Insufficient documentation

## 2016-02-12 DIAGNOSIS — C8330 Diffuse large B-cell lymphoma, unspecified site: Secondary | ICD-10-CM

## 2016-02-12 DIAGNOSIS — I2510 Atherosclerotic heart disease of native coronary artery without angina pectoris: Secondary | ICD-10-CM | POA: Diagnosis not present

## 2016-02-12 DIAGNOSIS — I110 Hypertensive heart disease with heart failure: Secondary | ICD-10-CM | POA: Diagnosis not present

## 2016-02-12 DIAGNOSIS — Z5111 Encounter for antineoplastic chemotherapy: Secondary | ICD-10-CM

## 2016-02-12 DIAGNOSIS — N3943 Post-void dribbling: Secondary | ICD-10-CM | POA: Diagnosis not present

## 2016-02-12 DIAGNOSIS — Z79899 Other long term (current) drug therapy: Secondary | ICD-10-CM | POA: Diagnosis not present

## 2016-02-12 DIAGNOSIS — C8589 Other specified types of non-Hodgkin lymphoma, extranodal and solid organ sites: Secondary | ICD-10-CM

## 2016-02-12 DIAGNOSIS — R112 Nausea with vomiting, unspecified: Secondary | ICD-10-CM

## 2016-02-12 DIAGNOSIS — R443 Hallucinations, unspecified: Secondary | ICD-10-CM | POA: Diagnosis present

## 2016-02-12 DIAGNOSIS — C8339 Diffuse large B-cell lymphoma, extranodal and solid organ sites: Secondary | ICD-10-CM

## 2016-02-12 DIAGNOSIS — I5042 Chronic combined systolic (congestive) and diastolic (congestive) heart failure: Secondary | ICD-10-CM | POA: Diagnosis not present

## 2016-02-12 DIAGNOSIS — R2689 Other abnormalities of gait and mobility: Secondary | ICD-10-CM | POA: Diagnosis not present

## 2016-02-12 DIAGNOSIS — R269 Unspecified abnormalities of gait and mobility: Secondary | ICD-10-CM | POA: Diagnosis not present

## 2016-02-12 DIAGNOSIS — C8580 Other specified types of non-Hodgkin lymphoma, unspecified site: Secondary | ICD-10-CM

## 2016-02-12 DIAGNOSIS — Z85528 Personal history of other malignant neoplasm of kidney: Secondary | ICD-10-CM | POA: Diagnosis not present

## 2016-02-12 LAB — URINE MICROSCOPIC-ADD ON

## 2016-02-12 LAB — I-STAT CHEM 8, ED
BUN: 12 mg/dL (ref 6–20)
CHLORIDE: 107 mmol/L (ref 101–111)
CREATININE: 1.1 mg/dL (ref 0.61–1.24)
Calcium, Ion: 1.12 mmol/L (ref 1.12–1.23)
GLUCOSE: 98 mg/dL (ref 65–99)
HCT: 27 % — ABNORMAL LOW (ref 39.0–52.0)
Hemoglobin: 9.2 g/dL — ABNORMAL LOW (ref 13.0–17.0)
POTASSIUM: 3.4 mmol/L — AB (ref 3.5–5.1)
Sodium: 140 mmol/L (ref 135–145)
TCO2: 22 mmol/L (ref 0–100)

## 2016-02-12 LAB — URINALYSIS, ROUTINE W REFLEX MICROSCOPIC
Bilirubin Urine: NEGATIVE
GLUCOSE, UA: NEGATIVE mg/dL
Ketones, ur: NEGATIVE mg/dL
LEUKOCYTES UA: NEGATIVE
Nitrite: NEGATIVE
Specific Gravity, Urine: 1.029 (ref 1.005–1.030)
pH: 5.5 (ref 5.0–8.0)

## 2016-02-12 LAB — CBC WITH DIFFERENTIAL/PLATELET
BASOS ABS: 0 10*3/uL (ref 0.0–0.1)
Basophils Relative: 0 %
Eosinophils Absolute: 0 10*3/uL (ref 0.0–0.7)
Eosinophils Relative: 1 %
HEMATOCRIT: 30.3 % — AB (ref 39.0–52.0)
HEMOGLOBIN: 10.3 g/dL — AB (ref 13.0–17.0)
LYMPHS PCT: 12 %
Lymphs Abs: 0.9 10*3/uL (ref 0.7–4.0)
MCH: 27.3 pg (ref 26.0–34.0)
MCHC: 34 g/dL (ref 30.0–36.0)
MCV: 80.4 fL (ref 78.0–100.0)
MONO ABS: 0.7 10*3/uL (ref 0.1–1.0)
Monocytes Relative: 9 %
NEUTROS ABS: 6.1 10*3/uL (ref 1.7–7.7)
NEUTROS PCT: 78 %
Platelets: 244 10*3/uL (ref 150–400)
RBC: 3.77 MIL/uL — AB (ref 4.22–5.81)
RDW: 14.9 % (ref 11.5–15.5)
WBC: 7.7 10*3/uL (ref 4.0–10.5)

## 2016-02-12 LAB — URIC ACID: URIC ACID, SERUM: 6.8 mg/dL (ref 4.4–7.6)

## 2016-02-12 MED ORDER — SODIUM CHLORIDE 0.9 % IV SOLN
Freq: Once | INTRAVENOUS | Status: AC
  Administered 2016-02-12: 10:00:00 via INTRAVENOUS

## 2016-02-12 MED ORDER — SODIUM CHLORIDE 0.9% FLUSH
10.0000 mL | INTRAVENOUS | Status: DC | PRN
  Administered 2016-02-12: 10 mL
  Filled 2016-02-12: qty 10

## 2016-02-12 MED ORDER — LORAZEPAM 2 MG/ML IJ SOLN: 0.5000 mg | Freq: Once | INTRAMUSCULAR | Status: DC

## 2016-02-12 MED ORDER — HEPARIN SOD (PORK) LOCK FLUSH 100 UNIT/ML IV SOLN
500.0000 [IU] | Freq: Once | INTRAVENOUS | Status: AC | PRN
  Administered 2016-02-12: 500 [IU]
  Filled 2016-02-12: qty 5

## 2016-02-12 MED ORDER — SODIUM CHLORIDE 0.9 % IV SOLN
50.0000 mg/m2 | Freq: Once | INTRAVENOUS | Status: AC
  Administered 2016-02-12: 90 mg via INTRAVENOUS
  Filled 2016-02-12: qty 4.5

## 2016-02-12 MED ORDER — PROCHLORPERAZINE MALEATE 10 MG PO TABS
10.0000 mg | ORAL_TABLET | Freq: Once | ORAL | Status: AC
  Administered 2016-02-12: 10 mg via ORAL

## 2016-02-12 MED ORDER — PROCHLORPERAZINE MALEATE 10 MG PO TABS
ORAL_TABLET | ORAL | Status: AC
  Filled 2016-02-12: qty 1

## 2016-02-12 MED ORDER — LORAZEPAM 2 MG/ML IJ SOLN
1.0000 mg | Freq: Once | INTRAMUSCULAR | Status: AC
  Administered 2016-02-12: 1 mg via INTRAVENOUS

## 2016-02-12 MED ORDER — PREDNISONE 20 MG PO TABS: 60.0000 mg | ORAL_TABLET | Freq: Every day | ORAL | Status: DC

## 2016-02-12 MED ORDER — LORAZEPAM 2 MG/ML IJ SOLN
INTRAMUSCULAR | Status: AC
  Filled 2016-02-12: qty 1

## 2016-02-12 NOTE — ED Notes (Signed)
Pt ambulated in room and hallway

## 2016-02-12 NOTE — ED Notes (Signed)
Patient walking up to triage nurse station stating "i am bored as hell", he is upset and ready to leave. Patient is asked to go sit back down for safety concerns and he refuses.

## 2016-02-12 NOTE — Progress Notes (Signed)
Reviewed pt's anti-emetic regimen with daughter along with the importance of taking Allopurinol as prescribed. Written instructions printed from previous AVS given. Daughter verbalizes understanding and repeats back instructions. Judson Roch, NP also spoke with daughter. dph

## 2016-02-12 NOTE — Progress Notes (Signed)
1000 am - Patient was unsteady at arrival to the clinic this morning. Patient was also confused and daughter stated he is been confused since yesterday. Upon arrival to the infusion room patient started to complain of being unable to urinate. nausea and vomiting and stated to vomit., spoke to the NP Sarah and got Ativan order for nausea. Patient was advice to see his kidney doctor.

## 2016-02-12 NOTE — ED Notes (Addendum)
Pt being treated for non-Hodgin's lymphoma at the cancer center. Last treatment today prior to arrival. Family reports pt has been having hallucinations at home since yesterday. No recent falls. Alert and oriented in triage. Was given ativan at the cancer center prior to arrival. Pt also reports decreased urination and hiccups. Pt supposed to be on allopurinol for side effects from treatments but has not been taking it.

## 2016-02-12 NOTE — Patient Instructions (Signed)
[facility] Discharge Instructions for Patients Receiving Chemotherapy  Today you received the following chemotherapy agents Etoposide  Take home medications for Nausea:  1) Ondansetron (ZOFRAN) 8 MG tablet      Take 1 tablet (8 mg total) by mouth 2 (two) times daily. Start the day after chemo for 3 days. Then take as needed for nausea or vomiting.        2) PredniSONE (DELTASONE) 20 MG tablet     Take 3 tablets (60 mg total) by mouth daily. Take on days 1-5 of chemotherapy.         3) Prochlorperazine (COMPAZINE) 10 MG tablet      Take 1 tablet (10 mg total) by mouth every 6 (six) hours as needed (Nausea or vomiting).            To help prevent nausea and vomiting after your treatment, we encourage you to take your nausea medication   If you develop nausea and vomiting that is not controlled by your nausea medication, call the clinic. If it is after clinic hours your family physician or the after hours number for the clinic or go to the Emergency Department.   BELOW ARE SYMPTOMS THAT SHOULD BE REPORTED IMMEDIATELY:  *FEVER GREATER THAN 100.5 F  *CHILLS WITH OR WITHOUT FEVER  NAUSEA AND VOMITING THAT IS NOT CONTROLLED WITH YOUR NAUSEA MEDICATION  *UNUSUAL SHORTNESS OF BREATH  *UNUSUAL BRUISING OR BLEEDING  TENDERNESS IN MOUTH AND THROAT WITH OR WITHOUT PRESENCE OF ULCERS  *URINARY PROBLEMS  *BOWEL PROBLEMS  UNUSUAL RASH Items with * indicate a potential emergency and should be followed up as soon as possible.  One of the nurses will contact you 24 hours after your treatment. Please let the nurse know about any problems that you Barritt have experienced. Feel free to call the clinic you have any questions or concerns. The clinic phone number is [phone]   I have been informed and understand all the instructions given to me. I know to contact the clinic, my physician, or go to the Emergency Department if any problems should occur. I do not have any  questions at this time, but understand that I Gaeta call the clinic during office hours   should I have any questions or need assistance in obtaining follow up care.    __________________________________________  _____________  __________ Signature of Patient or Authorized Representative            Date                   Time    __________________________________________ Nurse's Signature

## 2016-02-12 NOTE — ED Provider Notes (Signed)
CSN: [id number]     Arrival date & time 02/12/16  1308 History   First MD Initiated Contact with Patient 02/12/16 1342     Chief Complaint  Patient presents with  . Hallucinations     (Consider location/radiation/quality/duration/timing/severity/associated sxs/prior Treatment) HPI Comments: Pt comes to the ER with cc of hallucinations. PT has hx of cancer, getting infusion. Reports that he was started on chemo few days ago and since then he has been having a feeling that he is walking in air sometimes. He has no dizziness, spinning sensation. He denies headaches, numbness, vision complains or new emesis with that. Pt also reports that his urine is dribbling now. He denies any auditory or visual hallucinations. No recent fevers. No dysuria, hematuria. Pt taking meds as prescribed.   ROS 10 Systems reviewed and are negative for acute change except as noted in the HPI.     The history is provided by the patient.    Past Medical History  Diagnosis Date  . Gout   . GERD (gastroesophageal reflux disease)   . Hypertension   . Hypercholesteremia   . Shortness of breath     with activity  . History of epilepsy     at least 10 years ago. 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 Family history of anesthesia complication     mother-had stroke during anesthesia  . HTN (hypertension)   . Seizures ([place])   . History of myocardial infarction less than 8 weeks     Stent placed  . CAD (coronary artery disease)   . PAD (peripheral artery disease) ([place])   . Hyperlipemia   . Therapeutic drug monitoring   . Coronary artery calcification seen on CAT scan   . SOB (shortness of breath)   . History of tobacco use   . Systolic and diastolic CHF, chronic (HCC)     Resolved. EF 45% 04/10/2014  . Essential hypertension, benign   . History of nephrectomy 11/2013    For renal cell carcinoma  . Acid reflux   . Abnormal liver enzymes     Chronic alkaline phosphatase elevation since 2014  . History of  cardiac arrest 11/26/13    PTCA/Stenting of LM & Prox LAD with 4.0 x 18 mm Xience alpine stent. Used Impella Circulatory assist device. EF= 20-25%  . Coronary atherosclerosis of native coronary artery   . Cancer ([place]) 08/2013    right kidney cancer  . Cancer ([place])   . 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 No past surgeries    . Eye surgery Left 2 weeks ago    torn retina  . Robot assisted laparoscopic nephrectomy Right 10/19/2013    Procedure: ROBOTIC ASSISTED LAPAROSCOPIC NEPHRECTOMY,  EXTENSIVE ADHESIOLYSIS;  Surgeon: Alexis Frock, MD;  Location: [facility] ORS;  Service: Urology;  Laterality: Right;  . Coronary angioplasty with stent placement  2015    Left main coronary artery stenting on emergent basis along with circulatory support with Impella device through left leg. With 4.0 x 18 mm Xience alpine stent  . Nephrectomy  11/2013  . Iliac artery stent Left 05/28/2014    dr Einar Gip  . Left heart catheterization with coronary angiogram N/A 11/27/2013    Procedure: LEFT HEART CATHETERIZATION WITH CORONARY ANGIOGRAM;  Surgeon: [name], MD;  Location: [facility] CATH LAB;  Service: Cardiovascular;  Laterality: N/A;  . Percutaneous coronary stent intervention (  pci-s)  11/27/2013    Procedure: PERCUTANEOUS CORONARY STENT INTERVENTION (PCI-S);  Surgeon: [name], MD;  Location: [facility] CATH LAB;  Service: Cardiovascular;;  . Lower extremity angiogram N/A 02/26/2014    Procedure: LOWER EXTREMITY ANGIOGRAM;  Surgeon: [name], MD;  Location: [facility] CATH LAB;  Service: Cardiovascular;  Laterality: N/A;  . Lower extremity angiogram N/A 05/28/2014    Procedure: LOWER EXTREMITY ANGIOGRAM;  Surgeon: [name], MD;  Location: [facility] CATH LAB;  Service: Cardiovascular;  Laterality: N/A;  . Esophagogastroduodenoscopy    . Esophagogastroduodenoscopy N/A 01/21/2015     Procedure: ESOPHAGOGASTRODUODENOSCOPY (EGD) with possible dilation.;  Surgeon: Gatha Mayer, MD;  Location: [facility] ENDOSCOPY;  Service: Endoscopy;  Laterality: N/A;  . Balloon dilation N/A 01/21/2015    Procedure: BALLOON DILATION;  Surgeon: Gatha Mayer, MD;  Location: [facility] ENDOSCOPY;  Service: Endoscopy;  Laterality: N/A;  . Cardiac catheterization N/A 06/10/2015    Procedure: Left Heart Cath and Coronary Angiography;  Surgeon: Adrian Prows, MD;  Location: [facility] CV LAB;  Service: Cardiovascular;  Laterality: N/A;   Family History  Problem Relation Age of Onset  . Stroke Mother   . Heart disease Brother     multiple stents  . Hyperlipidemia Brother   . Emphysema Father    Social History  Substance Use Topics  . Smoking status: Former Smoker -- 2.00 packs/day for 45 years    Types: Cigarettes    Quit date: 08/03/2003  . Smokeless tobacco: Current User    Types: Chew     Comment: quit  smoking 10 years agop  . Alcohol Use: No     Comment: quit 1980    Review of Systems    Allergies  Oxycodone  Home Medications   Prior to Admission medications   Medication Sig Start Date End Date Taking? Authorizing Provider  allopurinol (ZYLOPRIM) 300 MG tablet Take 1 tablet (300 mg total) by mouth daily. 01/22/16  Yes Volanda Napoleon, MD  amLODipine (NORVASC) 10 MG tablet Take 10 mg by mouth daily.   Yes Historical Provider, MD  aspirin EC 81 MG tablet Take 1 tablet (81 mg total) by mouth daily. Patient taking differently: Take 81 mg by mouth every morning.  12/11/13  Yes Daniel J Angiulli, PA-C  atorvastatin (LIPITOR) 80 MG tablet Take 80 mg by mouth daily. 09/05/15  Yes Historical Provider, MD  carvedilol (COREG) 12.5 MG tablet Take 12.5 mg by mouth 2 (two) times daily with a meal.  11/13/14  Yes Historical Provider, MD  clopidogrel (PLAVIX) 75 MG tablet Take 75 mg by mouth daily.  10/31/15  Yes Historical Provider, MD  Cyanocobalamin (B-12) 5000 MCG CAPS Take 5,000 mcg by mouth every morning.     Yes Historical Provider, MD  cyclobenzaprine (FLEXERIL) 10 MG tablet Take 1 tablet (10 mg total) by mouth 3 (three) times daily as needed for muscle spasms. 10/23/15  Yes Veryl Speak, MD  lidocaine-prilocaine (EMLA) cream Apply to affected area once 01/22/16  Yes Volanda Napoleon, MD  LORazepam (ATIVAN) 0.5 MG tablet Take 1 tablet (0.5 mg total) by mouth every 8 (eight) hours. 02/11/16  Yes Eliezer Bottom, NP  PHENYTEK 300 MG ER capsule Take 1 capsule (300 mg total) by mouth every morning. Patient taking differently: Take 300 mg by mouth every morning.  09/06/15  Yes [name], MD  polyethylene glycol (MIRALAX / GLYCOLAX) packet Take 17 g by mouth daily.   Yes Historical Provider, MD  predniSONE (DELTASONE)  20 MG tablet Take 3 tablets (60 mg total) by mouth daily. Take on days 1-5 of chemotherapy. 02/12/16  Yes Eliezer Bottom, NP  prochlorperazine (COMPAZINE) 10 MG tablet Take 1 tablet (10 mg total) by mouth every 6 (six) hours as needed (Nausea or vomiting). 01/14/16  Yes Volanda Napoleon, MD  Psyllium (METAMUCIL PO) Take 3 capsules by mouth every evening. Reported on 10/24/2015   Yes Historical Provider, MD  pyridOXINE (VITAMIN B-6) 100 MG tablet Take 100 mg by mouth every morning.    Yes Historical Provider, MD  Thiamine HCl (VITAMIN B-1) 250 MG tablet Take 250 mg by mouth every morning.    Yes Historical Provider, MD  valsartan (DIOVAN) 160 MG tablet Take 160 mg by mouth every morning. Reported on 11/05/2015 11/08/14  Yes Historical Provider, MD  ondansetron (ZOFRAN) 8 MG tablet Take 1 tablet (8 mg total) by mouth 2 (two) times daily as needed for refractory nausea / vomiting. Start on day 3 after cyclophosphamide chemotherapy. Patient not taking: Reported on 02/12/2016 01/22/16   Volanda Napoleon, MD   BP 174/75 mmHg  Pulse 64  Temp(Src) 97.6 F (36.4 C) (Oral)  Resp 16  Wt 152 lb (68.947 kg)  SpO2 100% Physical Exam  Constitutional: He is oriented to person, place, and time. He appears  well-developed.  HENT:  Head: Normocephalic and atraumatic.  Eyes: Conjunctivae and EOM are normal. Pupils are equal, round, and reactive to light.  Neck: Normal range of motion. Neck supple.  Cardiovascular: Normal rate, regular rhythm and normal heart sounds.   Pulmonary/Chest: Effort normal and breath sounds normal. No respiratory distress. He has no wheezes.  Abdominal: Soft. Bowel sounds are normal. He exhibits no distension. There is no tenderness. There is no rebound and no guarding.  Musculoskeletal: He exhibits no edema or tenderness.  Cerebellar exam is normal (finger to nose) Sensory exam normal for bilateral upper and lower extremities - and patient is able to discriminate between sharp and dull. Motor exam is 4+/5   Neurological: He is alert and oriented to person, place, and time.  Skin: Skin is warm.  Nursing note and vitals reviewed.   ED Course  Procedures (including critical care time) Labs Review Labs Reviewed  URINALYSIS, ROUTINE W REFLEX MICROSCOPIC (NOT AT [facility]) - Abnormal; Notable for the following:    Hgb urine dipstick MODERATE (*)    Protein, ur >300 (*)    All other components within normal limits  CBC WITH DIFFERENTIAL/PLATELET - Abnormal; Notable for the following:    RBC 3.77 (*)    Hemoglobin 10.3 (*)    HCT 30.3 (*)    All other components within normal limits  URINE MICROSCOPIC-ADD ON - Abnormal; Notable for the following:    Squamous Epithelial / LPF 0-5 (*)    Bacteria, UA RARE (*)    All other components within normal limits  I-STAT CHEM 8, ED - Abnormal; Notable for the following:    Potassium 3.4 (*)    Hemoglobin 9.2 (*)    HCT 27.0 (*)    All other components within normal limits  URIC ACID    Imaging Review Ct Head Wo Contrast  02/12/2016  CLINICAL DATA:  Confusion and hallucinations. History of non-Hodgkin's lymphoma. EXAM: CT HEAD WITHOUT CONTRAST TECHNIQUE: Contiguous axial images were obtained from the base of the skull through  the vertex without intravenous contrast. COMPARISON:  11/27/2013 FINDINGS: The ventricles are normal in configuration. There is ventricular and sulcal enlargement reflecting mild  atrophy, stable from the prior study. There are no parenchymal masses or mass effect. There is no evidence of a recent infarct. A small old lacune infarct lies in the left base a ganglia. There is mild patchy white matter hypoattenuation consistent with chronic microvascular ischemic change. There are no extra-axial masses or abnormal fluid collections. There is no intracranial hemorrhage. Visualized sinuses and mastoid air cells are clear. IMPRESSION: 1. No acute intracranial abnormalities. 2. Mild atrophy and chronic microvascular ischemic change. Electronically Signed   By: Lajean Manes M.D.   On: 02/12/2016 15:35   I have personally reviewed and evaluated these images and lab results as part of my medical decision-making.   EKG Interpretation None      MDM   Final diagnoses:  Gait instability  Urinary dribbling   Pt comes in with feeling that he is walking in the air. He is not having any active hallucinations as far as i can tell. Has urinary dribbling.  Will check basic labs. CT head ordered. UA will be checked.     Varney Biles, MD 02/14/16 [phone]

## 2016-02-12 NOTE — Progress Notes (Signed)
Patient is mildly confused and daughter states he has not been able to urinate much. He has already completed treatment at this point. I advised that he go downstairs to the ED. Daughter states that she will speak with her sister first and then make a decision.

## 2016-02-12 NOTE — Discharge Instructions (Signed)
We saw you in the ER for All the results in the ER are normal, labs and imaging. We are not sure what is causing your symptoms. The workup in the ER is not complete, and is limited to screening for life threatening and emergent conditions only, so please see a primary care doctor for further evaluation. Be careful with walking at your home.  Please return to the ER if your symptoms worsen; you have increased pain, fevers, chills, inability to keep any medications down, confusion. Otherwise see the outpatient doctor as requested.   Urinary Incontinence Urinary incontinence is the involuntary loss of urine from your bladder. CAUSES  There are many causes of urinary incontinence. They include:  Medicines.  Infections.  Prostatic enlargement, leading to overflow of urine from your bladder.  Surgery.  Neurological diseases.  Emotional factors. SIGNS AND SYMPTOMS Urinary Incontinence can be divided into four types: 1. Urge incontinence. Urge incontinence is the involuntary loss of urine before you have the opportunity to go to the bathroom. There is a sudden urge to void but not enough time to reach a bathroom. 2. Stress incontinence. Stress incontinence is the sudden loss of urine with any activity that forces urine to pass. It is commonly caused by anatomical changes to the pelvis and sphincter areas of your body. 3. Overflow incontinence. Overflow incontinence is the loss of urine from an obstructed opening to your bladder. This results in a backup of urine and a resultant buildup of pressure within the bladder. When the pressure within the bladder exceeds the closing pressure of the sphincter, the urine overflows, which causes incontinence, similar to water overflowing a dam. 4. Total incontinence. Total incontinence is the loss of urine as a result of the inability to store urine within your bladder. DIAGNOSIS  Evaluating the cause of incontinence Br[place]oz[place] require:  A thorough and  complete medical and obstetric history.  A complete physical exam.  Laboratory tests such as a urine culture and sensitivities. When additional tests are indicated, they can include:  An ultrasound exam.  Kidney and bladder X-rays.  Cystoscopy. This is an exam of the bladder using a narrow scope.  Urodynamic testing to test the nerve function to the bladder and sphincter areas. TREATMENT  Treatment for urinary incontinence depends on the cause:  For urge incontinence caused by a bacterial infection, antibiotics will be prescribed. If the urge incontinence is related to medicines you take, your health care provider Infantino have you change the medicine.  For stress incontinence, surgery to re-establish anatomical support to the bladder or sphincter, or both, will often correct the condition.  For overflow incontinence caused by an enlarged prostate, an operation to open the channel through the enlarged prostate will allow the flow of urine out of the bladder. In women with fibroids, a hysterectomy [place] be recommended.  For total incontinence, surgery on your urinary sphincter [place] help. An artificial urinary sphincter (an inflatable cuff placed around the urethra) [place] be required. In women who have developed a hole-like passage between their bladder and vagina (vesicovaginal fistula), surgery to close the fistula often is required. HOME CARE INSTRUCTIONS  Normal daily hygiene and the use of pads or adult diapers that are changed regularly will help prevent odors and skin damage.  Avoid caffeine. It can overstimulate your bladder.  Use the bathroom regularly. Try about every 2-3 hours to go to the bathroom, even if you do not feel the need to do so. Take time to empty your bladder completely.  After urinating, wait a minute. Then try to urinate again.  For causes involving nerve dysfunction, keep a log of the medicines you take and a journal of the times you go to the bathroom. SEEK MEDICAL  CARE IF:  You experience worsening of pain instead of improvement in pain after your procedure.  Your incontinence becomes worse instead of better. SEE IMMEDIATE MEDICAL CARE IF:  You experience fever or shaking chills.  You are unable to pass your urine.  You have redness spreading into your groin or down into your thighs. MAKE SURE YOU:   Understand these instructions.   Will watch your condition.  Will get help right away if you are not doing well or get worse.   This information is not intended to replace advice given to you by your health care provider. Make sure you discuss any questions you have with your health care provider.   Document Released: 08/26/2004 Document Revised: 08/09/2014 Document Reviewed: 12/26/2012 Elsevier Interactive Patient Education [employer].

## 2016-02-13 ENCOUNTER — Ambulatory Visit (HOSPITAL_BASED_OUTPATIENT_CLINIC_OR_DEPARTMENT_OTHER): Payer: Medicare Other

## 2016-02-13 VITALS — BP 159/77 | HR 68 | Temp 97.7°F | Resp 16

## 2016-02-13 DIAGNOSIS — Z5111 Encounter for antineoplastic chemotherapy: Secondary | ICD-10-CM | POA: Diagnosis not present

## 2016-02-13 DIAGNOSIS — C8580 Other specified types of non-Hodgkin lymphoma, unspecified site: Secondary | ICD-10-CM

## 2016-02-13 DIAGNOSIS — C8330 Diffuse large B-cell lymphoma, unspecified site: Secondary | ICD-10-CM

## 2016-02-13 DIAGNOSIS — C8339 Diffuse large B-cell lymphoma, extranodal and solid organ sites: Secondary | ICD-10-CM

## 2016-02-13 DIAGNOSIS — C8589 Other specified types of non-Hodgkin lymphoma, extranodal and solid organ sites: Secondary | ICD-10-CM

## 2016-02-13 MED ORDER — HEPARIN SOD (PORK) LOCK FLUSH 100 UNIT/ML IV SOLN
500.0000 [IU] | Freq: Once | INTRAVENOUS | Status: AC | PRN
  Administered 2016-02-13: 500 [IU]
  Filled 2016-02-13: qty 5

## 2016-02-13 MED ORDER — SODIUM CHLORIDE 0.9 % IV SOLN
Freq: Once | INTRAVENOUS | Status: AC
  Administered 2016-02-13: 09:00:00 via INTRAVENOUS

## 2016-02-13 MED ORDER — SODIUM CHLORIDE 0.9% FLUSH
10.0000 mL | INTRAVENOUS | Status: DC | PRN
  Administered 2016-02-13: 10 mL
  Filled 2016-02-13: qty 10

## 2016-02-13 MED ORDER — PROCHLORPERAZINE MALEATE 10 MG PO TABS
ORAL_TABLET | ORAL | Status: AC
  Filled 2016-02-13: qty 1

## 2016-02-13 MED ORDER — PROCHLORPERAZINE MALEATE 10 MG PO TABS
10.0000 mg | ORAL_TABLET | Freq: Once | ORAL | Status: AC
  Administered 2016-02-13: 10 mg via ORAL

## 2016-02-13 MED ORDER — SODIUM CHLORIDE 0.9 % IV SOLN
50.0000 mg/m2 | Freq: Once | INTRAVENOUS | Status: AC
  Administered 2016-02-13: 90 mg via INTRAVENOUS
  Filled 2016-02-13: qty 4.5

## 2016-02-13 NOTE — Patient Instructions (Signed)
[facility] Discharge Instructions for Patients Receiving Chemotherapy  Today you received the following chemotherapy agents Etoposide  Take home medications for Nausea:  1) Ondansetron (ZOFRAN) 8 MG tablet      Take 1 tablet (8 mg total) by mouth 2 (two) times daily. Start the day after chemo for 3 days. Then take as needed for nausea or vomiting.        2) PredniSONE (DELTASONE) 20 MG tablet     Take 3 tablets (60 mg total) by mouth daily. Take on days 1-5 of chemotherapy.         3) Prochlorperazine (COMPAZINE) 10 MG tablet      Take 1 tablet (10 mg total) by mouth every 6 (six) hours as needed (Nausea or vomiting).            To help prevent nausea and vomiting after your treatment, we encourage you to take your nausea medication   If you develop nausea and vomiting that is not controlled by your nausea medication, call the clinic. If it is after clinic hours your family physician or the after hours number for the clinic or go to the Emergency Department.   BELOW ARE SYMPTOMS THAT SHOULD BE REPORTED IMMEDIATELY:  *FEVER GREATER THAN 100.5 F  *CHILLS WITH OR WITHOUT FEVER  NAUSEA AND VOMITING THAT IS NOT CONTROLLED WITH YOUR NAUSEA MEDICATION  *UNUSUAL SHORTNESS OF BREATH  *UNUSUAL BRUISING OR BLEEDING  TENDERNESS IN MOUTH AND THROAT WITH OR WITHOUT PRESENCE OF ULCERS  *URINARY PROBLEMS  *BOWEL PROBLEMS  UNUSUAL RASH Items with * indicate a potential emergency and should be followed up as soon as possible.  One of the nurses will contact you 24 hours after your treatment. Please let the nurse know about any problems that you [place] have experienced. Feel free to call the clinic you have any questions or concerns. The clinic phone number is [phone]   I have been informed and understand all the instructions given to me. I know to contact the clinic, my physician, or go to the Emergency Department if any problems should occur. I do not have any  questions at this time, but understand that I [place] call the clinic during office hours   should I have any questions or need assistance in obtaining follow up care.    __________________________________________  _____________  __________ Signature of Patient or Authorized Representative            Date                   Time    __________________________________________ Nurse's Signature

## 2016-02-19 ENCOUNTER — Emergency Department (HOSPITAL_COMMUNITY)
Admission: EM | Admit: 2016-02-19 | Discharge: 2016-02-19 | Disposition: A | Discharge status: Home / Self Care | Payer: Medicare Other | Attending: Emergency Medicine | Admitting: Emergency Medicine

## 2016-02-19 ENCOUNTER — Encounter: Payer: Self-pay | Admitting: Emergency Medicine

## 2016-02-19 ENCOUNTER — Encounter (HOSPITAL_COMMUNITY): Payer: Self-pay

## 2016-02-19 ENCOUNTER — Emergency Department (HOSPITAL_COMMUNITY): Payer: Medicare Other

## 2016-02-19 DIAGNOSIS — E7800 Pure hypercholesterolemia, unspecified: Secondary | ICD-10-CM | POA: Insufficient documentation

## 2016-02-19 DIAGNOSIS — I110 Hypertensive heart disease with heart failure: Secondary | ICD-10-CM | POA: Insufficient documentation

## 2016-02-19 DIAGNOSIS — Z859 Personal history of malignant neoplasm, unspecified: Secondary | ICD-10-CM | POA: Insufficient documentation

## 2016-02-19 DIAGNOSIS — I2510 Atherosclerotic heart disease of native coronary artery without angina pectoris: Secondary | ICD-10-CM | POA: Insufficient documentation

## 2016-02-19 DIAGNOSIS — Z87891 Personal history of nicotine dependence: Secondary | ICD-10-CM | POA: Insufficient documentation

## 2016-02-19 DIAGNOSIS — R1013 Epigastric pain: Secondary | ICD-10-CM

## 2016-02-19 DIAGNOSIS — Z79899 Other long term (current) drug therapy: Secondary | ICD-10-CM | POA: Diagnosis not present

## 2016-02-19 DIAGNOSIS — Z7982 Long term (current) use of aspirin: Secondary | ICD-10-CM | POA: Diagnosis not present

## 2016-02-19 DIAGNOSIS — R079 Chest pain, unspecified: Secondary | ICD-10-CM | POA: Diagnosis not present

## 2016-02-19 DIAGNOSIS — E785 Hyperlipidemia, unspecified: Secondary | ICD-10-CM | POA: Diagnosis not present

## 2016-02-19 DIAGNOSIS — I5042 Chronic combined systolic (congestive) and diastolic (congestive) heart failure: Secondary | ICD-10-CM | POA: Diagnosis not present

## 2016-02-19 LAB — CBC
HCT: 28.6 % — ABNORMAL LOW (ref 39.0–52.0)
Hemoglobin: 9.7 g/dL — ABNORMAL LOW (ref 13.0–17.0)
MCH: 27.4 pg (ref 26.0–34.0)
MCHC: 33.9 g/dL (ref 30.0–36.0)
MCV: 80.8 fL (ref 78.0–100.0)
PLATELETS: 122 10*3/uL — AB (ref 150–400)
RBC: 3.54 MIL/uL — AB (ref 4.22–5.81)
RDW: 14 % (ref 11.5–15.5)
WBC: 1.6 10*3/uL — AB (ref 4.0–10.5)

## 2016-02-19 LAB — BASIC METABOLIC PANEL
Anion gap: 5 (ref 5–15)
BUN: 26 mg/dL — AB (ref 6–20)
CALCIUM: 7.7 mg/dL — AB (ref 8.9–10.3)
CO2: 28 mmol/L (ref 22–32)
CREATININE: 0.97 mg/dL (ref 0.61–1.24)
Chloride: 107 mmol/L (ref 101–111)
GFR calc non Af Amer: >60 mL/min (ref >60)
Glucose, Bld: 112 mg/dL — ABNORMAL HIGH (ref 65–99)
Potassium: 3.4 mmol/L — ABNORMAL LOW (ref 3.5–5.1)
SODIUM: 140 mmol/L (ref 135–145)

## 2016-02-19 LAB — HEPATIC FUNCTION PANEL
ALBUMIN: 2.8 g/dL — AB (ref 3.5–5.0)
ALT: 18 U/L (ref 17–63)
AST: 24 U/L (ref 15–41)
Alkaline Phosphatase: 117 U/L (ref 38–126)
BILIRUBIN DIRECT: 0.1 mg/dL (ref 0.1–0.5)
Indirect Bilirubin: 0.4 mg/dL (ref 0.3–0.9)
Total Bilirubin: 0.5 mg/dL (ref 0.3–1.2)
Total Protein: 5.9 g/dL — ABNORMAL LOW (ref 6.5–8.1)

## 2016-02-19 LAB — LIPASE, BLOOD: LIPASE: 46 U/L (ref 11–51)

## 2016-02-19 MED ORDER — FAMOTIDINE 20 MG PO TABS
40.0000 mg | ORAL_TABLET | Freq: Once | ORAL | Status: AC
  Administered 2016-02-19: 40 mg via ORAL
  Filled 2016-02-19: qty 2

## 2016-02-19 MED ORDER — SODIUM CHLORIDE 0.9 % IV SOLN: INTRAVENOUS | Status: DC

## 2016-02-19 MED ORDER — SUCRALFATE 1 G PO TABS: 1.0000 g | ORAL_TABLET | Freq: Four times a day (QID) | ORAL | Status: DC

## 2016-02-19 MED ORDER — SODIUM CHLORIDE 0.9 % IV BOLUS (SEPSIS)
1000.0000 mL | Freq: Once | INTRAVENOUS | Status: AC
  Administered 2016-02-19: 1000 mL via INTRAVENOUS

## 2016-02-19 MED ORDER — GI COCKTAIL ~~LOC~~
30.0000 mL | Freq: Once | ORAL | Status: AC
  Administered 2016-02-19: 30 mL via ORAL
  Filled 2016-02-19: qty 30

## 2016-02-19 MED ORDER — PANTOPRAZOLE SODIUM 20 MG PO TBEC: 20.0000 mg | DELAYED_RELEASE_TABLET | Freq: Every day | ORAL | Status: DC

## 2016-02-19 NOTE — ED Provider Notes (Signed)
CSN: [id number]     Arrival date & time 02/19/16  1725 History   First MD Initiated Contact with Patient 02/19/16 1747     Chief Complaint  Patient presents with  . Chest Pain     (Consider location/radiation/quality/duration/timing/severity/associated sxs/prior Treatment) HPI Comments: 65 year old male presents with 24-hour history of constant epigastric discomfort which is nonradiating. No associated severe dyspnea or diaphoresis. Has had hiccups for quite some time and states that when he eats his symptoms get worse. Has had nausea but no vomiting. No fever or chills. No black or bloody stools. Denies any exertional component to this. Does have a history of CaD in the past. Patient states that these symptoms feel like his heartburn in the past. He has had some dizziness  Patient is a 65 y.o. male presenting with chest pain. The history is provided by the patient and a relative.  Chest Pain   Past Medical History  Diagnosis Date  . Gout   . GERD (gastroesophageal reflux disease)   . Hypertension   . Hypercholesteremia   . Shortness of breath     with activity  . History of epilepsy     at least 10 years ago. 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 Family history of anesthesia complication     mother-had stroke during anesthesia  . HTN (hypertension)   . Seizures ([place])   . History of myocardial infarction less than 8 weeks     Stent placed  . CAD (coronary artery disease)   . PAD (peripheral artery disease) ([place])   . Hyperlipemia   . Therapeutic drug monitoring   . Coronary artery calcification seen on CAT scan   . SOB (shortness of breath)   . History of tobacco use   . Systolic and diastolic CHF, chronic (HCC)     Resolved. EF 45% 04/10/2014  . Essential hypertension, benign   . History of nephrectomy 11/2013    For renal cell carcinoma  . Acid reflux   . Abnormal liver enzymes     Chronic alkaline phosphatase elevation since 2014  . History of cardiac arrest 11/26/13     PTCA/Stenting of LM & Prox LAD with 4.0 x 18 mm Xience alpine stent. Used Impella Circulatory assist device. EF= 20-25%  . Coronary atherosclerosis of native coronary artery   . Cancer ([place]) 08/2013    right kidney cancer  . Cancer ([place])   . 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 No past surgeries    . Eye surgery Left 2 weeks ago    torn retina  . Robot assisted laparoscopic nephrectomy Right 10/19/2013    Procedure: ROBOTIC ASSISTED LAPAROSCOPIC NEPHRECTOMY,  EXTENSIVE ADHESIOLYSIS;  Surgeon: Alexis Frock, MD;  Location: [facility] ORS;  Service: Urology;  Laterality: Right;  . Coronary angioplasty with stent placement  2015    Left main coronary artery stenting on emergent basis along with circulatory support with Impella device through left leg. With 4.0 x 18 mm Xience alpine stent  . Nephrectomy  11/2013  . Iliac artery stent Left 05/28/2014    dr Einar Gip  . Left heart catheterization with coronary angiogram N/A 11/27/2013    Procedure: LEFT HEART CATHETERIZATION WITH CORONARY ANGIOGRAM;  Surgeon: [name], MD;  Location: [facility] CATH LAB;  Service: Cardiovascular;  Laterality: N/A;  . Percutaneous coronary stent intervention (pci-s)  11/27/2013    Procedure: PERCUTANEOUS  CORONARY STENT INTERVENTION (PCI-S);  Surgeon: [name], MD;  Location: [facility] CATH LAB;  Service: Cardiovascular;;  . Lower extremity angiogram N/A 02/26/2014    Procedure: LOWER EXTREMITY ANGIOGRAM;  Surgeon: [name], MD;  Location: [facility] CATH LAB;  Service: Cardiovascular;  Laterality: N/A;  . Lower extremity angiogram N/A 05/28/2014    Procedure: LOWER EXTREMITY ANGIOGRAM;  Surgeon: [name], MD;  Location: [facility] CATH LAB;  Service: Cardiovascular;  Laterality: N/A;  . Esophagogastroduodenoscopy    . Esophagogastroduodenoscopy N/A 01/21/2015    Procedure:  ESOPHAGOGASTRODUODENOSCOPY (EGD) with possible dilation.;  Surgeon: Gatha Mayer, MD;  Location: [facility] ENDOSCOPY;  Service: Endoscopy;  Laterality: N/A;  . Balloon dilation N/A 01/21/2015    Procedure: BALLOON DILATION;  Surgeon: Gatha Mayer, MD;  Location: [facility] ENDOSCOPY;  Service: Endoscopy;  Laterality: N/A;  . Cardiac catheterization N/A 06/10/2015    Procedure: Left Heart Cath and Coronary Angiography;  Surgeon: Adrian Prows, MD;  Location: [facility] CV LAB;  Service: Cardiovascular;  Laterality: N/A;   Family History  Problem Relation Age of Onset  . Stroke Mother   . Heart disease Brother     multiple stents  . Hyperlipidemia Brother   . Emphysema Father    Social History  Substance Use Topics  . Smoking status: Former Smoker -- 2.00 packs/day for 45 years    Types: Cigarettes    Quit date: 08/03/2003  . Smokeless tobacco: Current User    Types: Chew     Comment: quit  smoking 10 years agop  . Alcohol Use: No     Comment: quit 1980    Review of Systems  Cardiovascular: Positive for chest pain.  All other systems reviewed and are negative.     Allergies  Oxycodone  Home Medications   Prior to Admission medications   Medication Sig Start Date End Date Taking? Authorizing Provider  allopurinol (ZYLOPRIM) 300 MG tablet Take 1 tablet (300 mg total) by mouth daily. 01/22/16  Yes Volanda Napoleon, MD  amLODipine (NORVASC) 10 MG tablet Take 10 mg by mouth daily.   Yes Historical Provider, MD  aspirin EC 81 MG tablet Take 1 tablet (81 mg total) by mouth daily. Patient taking differently: Take 81 mg by mouth every morning.  12/11/13  Yes Daniel J Angiulli, PA-C  atorvastatin (LIPITOR) 80 MG tablet Take 80 mg by mouth daily. 09/05/15  Yes Historical Provider, MD  carvedilol (COREG) 12.5 MG tablet Take 12.5 mg by mouth 2 (two) times daily with a meal.  11/13/14  Yes Historical Provider, MD  clopidogrel (PLAVIX) 75 MG tablet Take 75 mg by mouth daily.  10/31/15  Yes Historical Provider,  MD  Cyanocobalamin (B-12) 5000 MCG CAPS Take 5,000 mcg by mouth every morning.    Yes Historical Provider, MD  lidocaine-prilocaine (EMLA) cream Apply to affected area once Patient taking differently: Apply 1 application topically daily as needed (port access). Apply to affected area once 01/22/16  Yes Volanda Napoleon, MD  LORazepam (ATIVAN) 0.5 MG tablet Take 1 tablet (0.5 mg total) by mouth every 8 (eight) hours. Patient taking differently: Take 0.5 mg by mouth every 8 (eight) hours as needed for anxiety (nausea).  02/11/16  Yes Eliezer Bottom, NP  ondansetron (ZOFRAN) 8 MG tablet Take 1 tablet (8 mg total) by mouth 2 (two) times daily as needed for refractory nausea / vomiting. Start on day 3 after cyclophosphamide chemotherapy. 01/22/16  Yes Volanda Napoleon, MD  PHENYTEK 300 MG  ER capsule Take 1 capsule (300 mg total) by mouth every morning. Patient taking differently: Take 300 mg by mouth every morning.  09/06/15  Yes [name], MD  polyethylene glycol (MIRALAX / [place]) packet Take 17 g by mouth daily as needed for moderate constipation.    Yes Historical Provider, MD  prochlorperazine (COMPAZINE) 10 MG tablet Take 1 tablet (10 mg total) by mouth every 6 (six) hours as needed (Nausea or vomiting). 01/14/16  Yes Volanda Napoleon, MD  Psyllium (METAMUCIL PO) Take 3 capsules by mouth every evening. Reported on 10/24/2015   Yes Historical Provider, MD  pyridOXINE (VITAMIN B-6) 100 MG tablet Take 100 mg by mouth every morning.    Yes Historical Provider, MD  Thiamine HCl (VITAMIN B-1) 250 MG tablet Take 250 mg by mouth every morning.    Yes Historical Provider, MD  valsartan (DIOVAN) 160 MG tablet Take 160 mg by mouth every morning. Reported on 11/05/2015 11/08/14  Yes Historical Provider, MD  cyclobenzaprine (FLEXERIL) 10 MG tablet Take 1 tablet (10 mg total) by mouth 3 (three) times daily as needed for muscle spasms. Patient not taking: Reported on 02/19/2016 10/23/15   Veryl Speak, MD  predniSONE  (DELTASONE) 20 MG tablet Take 3 tablets (60 mg total) by mouth daily. Take on days 1-5 of chemotherapy. Patient not taking: Reported on 02/19/2016 02/12/16   [place], NP   BP 161/82 mmHg  Pulse 65  Temp(Src) 98.6 F (37 C) (Oral)  Resp 17  SpO2 100% Physical Exam  Constitutional: He is oriented to person, place, and time. He appears well-developed and well-nourished.  Non-toxic appearance. No distress.  HENT:  Head: Normocephalic and atraumatic.  Eyes: Conjunctivae, EOM and lids are normal. Pupils are equal, round, and reactive to light.  Neck: Normal range of motion. Neck supple. No tracheal deviation present. No thyroid mass present.  Cardiovascular: Normal rate, regular rhythm and normal heart sounds.  Exam reveals no gallop.   No murmur heard. Pulmonary/Chest: Effort normal and breath sounds normal. No stridor. No respiratory distress. He has no decreased breath sounds. He has no wheezes. He has no rhonchi. He has no rales.  Abdominal: Soft. Normal appearance and bowel sounds are normal. He exhibits no distension. There is no tenderness. There is no rebound and no CVA tenderness.  Musculoskeletal: Normal range of motion. He exhibits no edema or tenderness.  Neurological: He is alert and oriented to person, place, and time. He has normal strength. No cranial nerve deficit or sensory deficit. GCS eye subscore is 4. GCS verbal subscore is 5. GCS motor subscore is 6.  Skin: Skin is warm and dry. No abrasion and no rash noted.  Psychiatric: He has a normal mood and affect. His speech is normal and behavior is normal.  Nursing note and vitals reviewed.   ED Course  Procedures (including critical care time) Labs Review Labs Reviewed  BASIC METABOLIC PANEL - Abnormal; Notable for the following:    Potassium 3.4 (*)    Glucose, Bld 112 (*)    BUN 26 (*)    Calcium 7.7 (*)    All other components within normal limits  CBC - Abnormal; Notable for the following:    WBC 1.6 (*)     RBC 3.54 (*)    Hemoglobin 9.7 (*)    HCT 28.6 (*)    Platelets 122 (*)    All other components within normal limits  HEPATIC FUNCTION PANEL - Abnormal; Notable for the following:  Total Protein 5.9 (*)    Albumin 2.8 (*)    All other components within normal limits  LIPASE, BLOOD  I-STAT TROPOININ, ED    Imaging Review Dg Chest 2 View  02/19/2016  CLINICAL DATA:  Severe chest pain for 24 hours. Known metastatic disease. EXAM: CHEST  2 VIEW COMPARISON:  Chest x-ray 10/24/2015 FINDINGS: The right IJ power port is in good position with the tip in the mid SVC. The cardiac silhouette, mediastinal and hilar contours are within normal limits and stable. The lungs are clear of acute process. I do not see the metastatic pulmonary nodules that were demonstrated on the prior PET-CT. Stable sclerotic metastatic disease involving the left glenoid and scattered ribs. IMPRESSION: No acute cardiopulmonary findings. Electronically Signed   By: Marijo Sanes M.D.   On: 02/19/2016 19:38   I have personally reviewed and evaluated these images and lab results as part of my medical decision-making.   EKG Interpretation   Date/Time:  Thursday February 19 2016 17:29:57 EDT Ventricular Rate:  61 PR Interval:    QRS Duration: 110 QT Interval:  423 QTC Calculation: 427 R Axis:   77 Text Interpretation:  Sinus rhythm Borderline short PR interval Probable  LVH with secondary repol abnrm Anterior Q waves, possibly due to LVH No  significant change since last tracing Confirmed by Taleeyah Bora  MD, Rajni Holsworth  ([id number]) on 02/19/2016 7:30:33 PM      MDM   Final diagnoses:  None    Patient given GI cocktail and also better. Patient recently did receive chemotherapy for his Hodgkin's lymphoma and has had some vomiting due to this. Suspect that he does have some element of GERD to his current symptoms. Was given IV fluids here. Do not think that this represents ACS. Chest x-ray without acute findings. Patient stable for  discharge    Lacretia Leigh, MD 02/19/16 [phone]

## 2016-02-19 NOTE — Discharge Instructions (Signed)
Abdominal Pain, Adult Many things can cause abdominal pain. Usually, abdominal pain is not caused by a disease and will improve without treatment. It can often be observed and treated at home. Your health care provider will do a physical exam and possibly order blood tests and X-rays to help determine the seriousness of your pain. However, in many cases, more time must pass before a clear cause of the pain can be found. Before that point, your health care provider [place] not know if you need more testing or further treatment. HOME CARE INSTRUCTIONS Monitor your abdominal pain for any changes. The following actions [place] help to alleviate any discomfort you are experiencing:  Only take over-the-counter or prescription medicines as directed by your health care provider.  Do not take laxatives unless directed to do so by your health care provider.  Try a clear liquid diet (broth, tea, or water) as directed by your health care provider. Slowly move to a bland diet as tolerated. SEEK MEDICAL CARE IF:  You have unexplained abdominal pain.  You have abdominal pain associated with nausea or diarrhea.  You have pain when you urinate or have a bowel movement.  You experience abdominal pain that wakes you in the night.  You have abdominal pain that is worsened or improved by eating food.  You have abdominal pain that is worsened with eating fatty foods.  You have a fever. SEEK IMMEDIATE MEDICAL CARE IF:  Your pain does not go away within 2 hours.  You keep throwing up (vomiting).  Your pain is felt only in portions of the abdomen, such as the right side or the left lower portion of the abdomen.  You pass bloody or black tarry stools. MAKE SURE YOU:  Understand these instructions.  Will watch your condition.  Will get help right away if you are not doing well or get worse.   This information is not intended to replace advice given to you by your health care provider. Make sure you discuss  any questions you have with your health care provider.   Document Released: 04/28/2005 Document Revised: 04/09/2015 Document Reviewed: 03/28/2013 Elsevier Interactive Patient Education 2016 Elsevier Inc.  Abdominal Pain, Adult Many things can cause abdominal pain. Usually, abdominal pain is not caused by a disease and will improve without treatment. It can often be observed and treated at home. Your health care provider will do a physical exam and possibly order blood tests and X-rays to help determine the seriousness of your pain. However, in many cases, more time must pass before a clear cause of the pain can be found. Before that point, your health care provider [place] not know if you need more testing or further treatment. HOME CARE INSTRUCTIONS Monitor your abdominal pain for any changes. The following actions [place] help to alleviate any discomfort you are experiencing:  Only take over-the-counter or prescription medicines as directed by your health care provider.  Do not take laxatives unless directed to do so by your health care provider.  Try a clear liquid diet (broth, tea, or water) as directed by your health care provider. Slowly move to a bland diet as tolerated. SEEK MEDICAL CARE IF:  You have unexplained abdominal pain.  You have abdominal pain associated with nausea or diarrhea.  You have pain when you urinate or have a bowel movement.  You experience abdominal pain that wakes you in the night.  You have abdominal pain that is worsened or improved by eating food.  You have abdominal  pain that is worsened with eating fatty foods.  You have a fever. SEEK IMMEDIATE MEDICAL CARE IF:  Your pain does not go away within 2 hours.  You keep throwing up (vomiting).  Your pain is felt only in portions of the abdomen, such as the right side or the left lower portion of the abdomen.  You pass bloody or black tarry stools. MAKE SURE YOU:  Understand these instructions.  Will  watch your condition.  Will get help right away if you are not doing well or get worse.   This information is not intended to replace advice given to you by your health care provider. Make sure you discuss any questions you have with your health care provider.   Document Released: 04/28/2005 Document Revised: 04/09/2015 Document Reviewed: 03/28/2013 Elsevier Interactive Patient Education [employer].

## 2016-02-19 NOTE — ED Notes (Signed)
Pt presents with c/o chest pain that started last night. Pt reports hx of heart attack 2 years ago. Pt reports the pain "feels like heartburn". Pt also reporting some associated shortness of breath and dizziness.

## 2016-02-21 ENCOUNTER — Telehealth: Payer: Self-pay | Admitting: Emergency Medicine

## 2016-02-21 NOTE — Telephone Encounter (Signed)
Attempted to reach pt regarding disability placard that is now ready for pick up Number listed not taking incoming calls at this time Will try again

## 2016-02-25 ENCOUNTER — Ambulatory Visit (HOSPITAL_COMMUNITY)
Admission: RE | Admit: 2016-02-25 | Discharge: 2016-02-25 | Disposition: A | Discharge status: Home / Self Care | Payer: Medicare Other | Source: Ambulatory Visit | Attending: Family | Admitting: Family

## 2016-02-25 DIAGNOSIS — I2510 Atherosclerotic heart disease of native coronary artery without angina pectoris: Secondary | ICD-10-CM | POA: Insufficient documentation

## 2016-02-25 DIAGNOSIS — C8589 Other specified types of non-Hodgkin lymphoma, extranodal and solid organ sites: Secondary | ICD-10-CM

## 2016-02-25 DIAGNOSIS — C8580 Other specified types of non-Hodgkin lymphoma, unspecified site: Secondary | ICD-10-CM | POA: Diagnosis not present

## 2016-02-25 DIAGNOSIS — C641 Malignant neoplasm of right kidney, except renal pelvis: Secondary | ICD-10-CM | POA: Diagnosis not present

## 2016-02-25 DIAGNOSIS — C8330 Diffuse large B-cell lymphoma, unspecified site: Secondary | ICD-10-CM

## 2016-02-25 DIAGNOSIS — R918 Other nonspecific abnormal finding of lung field: Secondary | ICD-10-CM | POA: Diagnosis not present

## 2016-02-25 DIAGNOSIS — I700 Atherosclerosis of aorta: Secondary | ICD-10-CM | POA: Diagnosis not present

## 2016-02-25 DIAGNOSIS — Z9889 Other specified postprocedural states: Secondary | ICD-10-CM | POA: Diagnosis not present

## 2016-02-25 DIAGNOSIS — K228 Other specified diseases of esophagus: Secondary | ICD-10-CM | POA: Diagnosis not present

## 2016-02-25 LAB — GLUCOSE, CAPILLARY: Glucose-Capillary: 100 mg/dL — ABNORMAL HIGH (ref 65–99)

## 2016-02-25 MED ORDER — FLUDEOXYGLUCOSE F - 18 (FDG) INJECTION
7.6000 | Freq: Once | INTRAVENOUS | Status: AC | PRN
  Administered 2016-02-25: 7.6 via INTRAVENOUS

## 2016-02-26 ENCOUNTER — Telehealth: Payer: Self-pay | Admitting: Family

## 2016-02-26 NOTE — Telephone Encounter (Signed)
I spoke with the patient's daughter Leafy Ro and gave her the his PET scan results per his request. All questions were answered and we will plan to see him next week for labs, follow-up and treatment.

## 2016-03-03 ENCOUNTER — Ambulatory Visit: Payer: Medicare Other | Admitting: Hematology & Oncology

## 2016-03-03 ENCOUNTER — Ambulatory Visit (HOSPITAL_BASED_OUTPATIENT_CLINIC_OR_DEPARTMENT_OTHER): Payer: Medicare Other | Admitting: Hematology & Oncology

## 2016-03-03 ENCOUNTER — Encounter: Payer: Self-pay | Admitting: Hematology & Oncology

## 2016-03-03 ENCOUNTER — Ambulatory Visit: Payer: Medicare Other

## 2016-03-03 ENCOUNTER — Other Ambulatory Visit: Payer: Medicare Other

## 2016-03-03 ENCOUNTER — Other Ambulatory Visit (HOSPITAL_BASED_OUTPATIENT_CLINIC_OR_DEPARTMENT_OTHER): Payer: Medicare Other

## 2016-03-03 ENCOUNTER — Ambulatory Visit (HOSPITAL_BASED_OUTPATIENT_CLINIC_OR_DEPARTMENT_OTHER): Payer: Medicare Other

## 2016-03-03 VITALS — BP 152/62 | HR 63 | Temp 97.3°F | Resp 20

## 2016-03-03 VITALS — BP 150/60 | HR 58 | Temp 97.9°F | Wt 155.0 lb

## 2016-03-03 DIAGNOSIS — Z5111 Encounter for antineoplastic chemotherapy: Secondary | ICD-10-CM

## 2016-03-03 DIAGNOSIS — C8589 Other specified types of non-Hodgkin lymphoma, extranodal and solid organ sites: Secondary | ICD-10-CM

## 2016-03-03 DIAGNOSIS — C8580 Other specified types of non-Hodgkin lymphoma, unspecified site: Secondary | ICD-10-CM

## 2016-03-03 DIAGNOSIS — C8330 Diffuse large B-cell lymphoma, unspecified site: Secondary | ICD-10-CM

## 2016-03-03 DIAGNOSIS — C8339 Diffuse large B-cell lymphoma, extranodal and solid organ sites: Secondary | ICD-10-CM

## 2016-03-03 DIAGNOSIS — I519 Heart disease, unspecified: Secondary | ICD-10-CM | POA: Diagnosis not present

## 2016-03-03 DIAGNOSIS — C641 Malignant neoplasm of right kidney, except renal pelvis: Secondary | ICD-10-CM | POA: Diagnosis not present

## 2016-03-03 DIAGNOSIS — Z5112 Encounter for antineoplastic immunotherapy: Secondary | ICD-10-CM

## 2016-03-03 LAB — CMP (CANCER CENTER ONLY)
ALT(SGPT): 14 U/L (ref 10–47)
AST: 22 U/L (ref 11–38)
Albumin: 2.1 g/dL — ABNORMAL LOW (ref 3.3–5.5)
Alkaline Phosphatase: 119 U/L — ABNORMAL HIGH (ref 26–84)
BUN: 10 mg/dL (ref 7–22)
CHLORIDE: 108 meq/L (ref 98–108)
CO2: 26 meq/L (ref 18–33)
CREATININE: 1 mg/dL (ref 0.6–1.2)
Calcium: 8.1 mg/dL (ref 8.0–10.3)
GLUCOSE: 157 mg/dL — AB (ref 73–118)
Potassium: 3.1 mEq/L — ABNORMAL LOW (ref 3.3–4.7)
SODIUM: 136 meq/L (ref 128–145)
TOTAL PROTEIN: 5.3 g/dL — AB (ref 6.4–8.1)
Total Bilirubin: 0.5 mg/dl (ref 0.20–1.60)

## 2016-03-03 LAB — CBC WITH DIFFERENTIAL (CANCER CENTER ONLY)
BASO#: 0.1 10*3/uL (ref 0.0–0.2)
BASO%: 1.6 % (ref 0.0–2.0)
EOS%: 3 % (ref 0.0–7.0)
Eosinophils Absolute: 0.1 10*3/uL (ref 0.0–0.5)
HCT: 26.4 % — ABNORMAL LOW (ref 38.7–49.9)
HEMOGLOBIN: 9 g/dL — AB (ref 13.0–17.1)
LYMPH#: 0.4 10*3/uL — ABNORMAL LOW (ref 0.9–3.3)
LYMPH%: 8.6 % — ABNORMAL LOW (ref 14.0–48.0)
MCH: 28.7 pg (ref 28.0–33.4)
MCHC: 34.1 g/dL (ref 32.0–35.9)
MCV: 84 fL (ref 82–98)
MONO#: 0.2 10*3/uL (ref 0.1–0.9)
MONO%: 4.9 % (ref 0.0–13.0)
NEUT%: 81.9 % — ABNORMAL HIGH (ref 40.0–80.0)
NEUTROS ABS: 3.5 10*3/uL (ref 1.5–6.5)
Platelets: 118 10*3/uL — ABNORMAL LOW (ref 145–400)
RBC: 3.14 10*6/uL — AB (ref 4.20–5.70)
RDW: 16.8 % — ABNORMAL HIGH (ref 11.1–15.7)
WBC: 4.3 10*3/uL (ref 4.0–10.0)

## 2016-03-03 LAB — LACTATE DEHYDROGENASE: LDH: 147 U/L (ref 125–245)

## 2016-03-03 MED ORDER — DENOSUMAB 120 MG/1.7ML ~~LOC~~ SOLN
120.0000 mg | Freq: Once | SUBCUTANEOUS | Status: AC
  Administered 2016-03-03: 120 mg via SUBCUTANEOUS
  Filled 2016-03-03: qty 1.7

## 2016-03-03 MED ORDER — HEPARIN SOD (PORK) LOCK FLUSH 100 UNIT/ML IV SOLN
500.0000 [IU] | Freq: Once | INTRAVENOUS | Status: AC | PRN
  Administered 2016-03-03: 500 [IU]
  Filled 2016-03-03: qty 5

## 2016-03-03 MED ORDER — SODIUM CHLORIDE 0.9 % IV SOLN
Freq: Once | INTRAVENOUS | Status: AC
  Administered 2016-03-03: 10:00:00 via INTRAVENOUS
  Filled 2016-03-03: qty 5

## 2016-03-03 MED ORDER — ACETAMINOPHEN 325 MG PO TABS
650.0000 mg | ORAL_TABLET | Freq: Once | ORAL | Status: AC
  Administered 2016-03-03: 650 mg via ORAL

## 2016-03-03 MED ORDER — VINCRISTINE SULFATE CHEMO INJECTION 1 MG/ML
2.0000 mg | Freq: Once | INTRAVENOUS | Status: AC
  Administered 2016-03-03: 2 mg via INTRAVENOUS
  Filled 2016-03-03: qty 2

## 2016-03-03 MED ORDER — CYCLOPHOSPHAMIDE CHEMO INJECTION 1 GM
750.0000 mg/m2 | Freq: Once | INTRAMUSCULAR | Status: AC
  Administered 2016-03-03: 1420 mg via INTRAVENOUS
  Filled 2016-03-03: qty 71

## 2016-03-03 MED ORDER — PALONOSETRON HCL INJECTION 0.25 MG/5ML
INTRAVENOUS | Status: AC
  Filled 2016-03-03: qty 5

## 2016-03-03 MED ORDER — SODIUM CHLORIDE 0.9 % IV SOLN
375.0000 mg/m2 | Freq: Once | INTRAVENOUS | Status: AC
  Administered 2016-03-03: 700 mg via INTRAVENOUS
  Filled 2016-03-03: qty 50

## 2016-03-03 MED ORDER — ACETAMINOPHEN 325 MG PO TABS
ORAL_TABLET | ORAL | Status: AC
  Filled 2016-03-03: qty 2

## 2016-03-03 MED ORDER — SODIUM CHLORIDE 0.9 % IV SOLN
50.0000 mg/m2 | Freq: Once | INTRAVENOUS | Status: AC
  Administered 2016-03-03: 90 mg via INTRAVENOUS
  Filled 2016-03-03: qty 4.5

## 2016-03-03 MED ORDER — DIPHENHYDRAMINE HCL 25 MG PO CAPS
ORAL_CAPSULE | ORAL | Status: AC
  Filled 2016-03-03: qty 2

## 2016-03-03 MED ORDER — DIPHENHYDRAMINE HCL 25 MG PO CAPS
50.0000 mg | ORAL_CAPSULE | Freq: Once | ORAL | Status: AC
  Administered 2016-03-03: 50 mg via ORAL

## 2016-03-03 MED ORDER — SODIUM CHLORIDE 0.9% FLUSH
10.0000 mL | INTRAVENOUS | Status: DC | PRN
  Administered 2016-03-03: 10 mL
  Filled 2016-03-03: qty 10

## 2016-03-03 MED ORDER — SODIUM CHLORIDE 0.9 % IV SOLN
Freq: Once | INTRAVENOUS | Status: AC
  Administered 2016-03-03: 10:00:00 via INTRAVENOUS

## 2016-03-03 MED ORDER — PALONOSETRON HCL INJECTION 0.25 MG/5ML
0.2500 mg | Freq: Once | INTRAVENOUS | Status: AC
  Administered 2016-03-03: 0.25 mg via INTRAVENOUS

## 2016-03-03 NOTE — Patient Instructions (Signed)
[facility] Discharge Instructions for Patients Receiving Chemotherapy  Today you received the following chemotherapy agents Etoposide, Rituxan Cytoxan and Oncovin  Take home medications for Nausea:  1) Ondansetron (ZOFRAN) 8 MG tablet      Take 1 tablet (8 mg total) by mouth 2 (two) times daily. Start the day after chemo for 3 days. Then take as needed for nausea or vomiting.        2) PredniSONE (DELTASONE) 20 MG tablet     Take 3 tablets (60 mg total) by mouth daily. Take on days 1-5 of chemotherapy.         3) Prochlorperazine (COMPAZINE) 10 MG tablet      Take 1 tablet (10 mg total) by mouth every 6 (six) hours as needed (Nausea or vomiting).            To help prevent nausea and vomiting after your treatment, we encourage you to take your nausea medication   If you develop nausea and vomiting that is not controlled by your nausea medication, call the clinic. If it is after clinic hours your family physician or the after hours number for the clinic or go to the Emergency Department.   BELOW ARE SYMPTOMS THAT SHOULD BE REPORTED IMMEDIATELY:  *FEVER GREATER THAN 100.5 F  *CHILLS WITH OR WITHOUT FEVER  NAUSEA AND VOMITING THAT IS NOT CONTROLLED WITH YOUR NAUSEA MEDICATION  *UNUSUAL SHORTNESS OF BREATH  *UNUSUAL BRUISING OR BLEEDING  TENDERNESS IN MOUTH AND THROAT WITH OR WITHOUT PRESENCE OF ULCERS  *URINARY PROBLEMS  *BOWEL PROBLEMS  UNUSUAL RASH Items with * indicate a potential emergency and should be followed up as soon as possible.  One of the nurses will contact you 24 hours after your treatment. Please let the nurse know about any problems that you [place] have experienced. Feel free to call the clinic you have any questions or concerns. The clinic phone number is [phone]   I have been informed and understand all the instructions given to me. I know to contact the clinic, my physician, or go to the Emergency Department if any problems should  occur. I do not have any questions at this time, but understand that I [place] call the clinic during office hours   should I have any questions or need assistance in obtaining follow up care.    __________________________________________  _____________  __________ Signature of Patient or Authorized Representative            Date                   Time    __________________________________________ Nurse's Signature

## 2016-03-03 NOTE — Progress Notes (Signed)
Hematology and Oncology Follow Up Visit  Gilbert [name] [id number] 1951-01-14 65 y.o. 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  We did go ahead and repeat a PET scan on him at his second cycle of treatment. He had a similar response to therapy. He had response in the nodal areas.  He feels pretty good. He is not complaining of any pain. There might be some fatigue. He's not been out playing golf yet.  He's had no mouth sores. He's had no diarrhea. He's had no urinary issues.  He's had no cough or shortness of breath. He's had no bleeding. He's had no leg swelling.  Overall, his performance status is ECOG 1.     Medications:    Medication List       Accurate as of 03/03/16  9:10 AM. Always use your most recent med list.          allopurinol 300 MG tablet Commonly known as:  ZYLOPRIM Take 1 tablet (300 mg total) by mouth daily.   amLODipine 10 MG tablet Commonly known as:  NORVASC Take 10 mg by mouth daily.   aspirin EC 81 MG tablet Take 1 tablet (81 mg total) by mouth daily.   atorvastatin 80 MG tablet Commonly known as:  LIPITOR Take 80 mg by mouth daily.   B-12 5000 MCG Caps Take 5,000 mcg by mouth every morning.   carvedilol 12.5 MG tablet Commonly known as:  COREG Take 12.5 mg by mouth 2 (two) times daily with a meal.   clopidogrel 75 MG tablet Commonly known as:  PLAVIX Take 75 mg by mouth daily.   cyclobenzaprine 10 MG tablet Commonly known as:  FLEXERIL Take 1 tablet (10 mg total) by mouth 3 (three) times daily as needed for muscle spasms.   lidocaine-prilocaine cream Commonly known as:  EMLA Apply to affected area once   LORazepam 0.5 MG tablet Commonly known as:  ATIVAN Take 1 tablet (0.5 mg total) by mouth every 8  (eight) hours.   METAMUCIL PO Take 3 capsules by mouth every evening. Reported on 10/24/2015   ondansetron 8 MG tablet Commonly known as:  ZOFRAN Take 1 tablet (8 mg total) by mouth 2 (two) times daily as needed for refractory nausea / vomiting. Start on day 3 after cyclophosphamide chemotherapy.   pantoprazole 20 MG tablet Commonly known as:  PROTONIX Take 1 tablet (20 mg total) by mouth daily.   PHENYTEK 300 MG ER capsule Generic drug:  phenytoin Take 1 capsule (300 mg total) by mouth every morning.   polyethylene glycol packet Commonly known as:  MIRALAX / GLYCOLAX Take 17 g by mouth daily as needed for moderate constipation.   predniSONE 20 MG tablet Commonly known as:  DELTASONE Take 3 tablets (60 mg total) by mouth daily. Take on days 1-5 of chemotherapy.   prochlorperazine 10 MG tablet Commonly known as:  COMPAZINE Take 1 tablet (10 mg total) by mouth every 6 (six) hours as needed (Nausea or vomiting).   pyridOXINE 100 MG tablet Commonly known as:  VITAMIN B-6 Take 100 mg by mouth every morning.   sucralfate 1 g tablet Commonly known as:  CARAFATE Take 1 tablet (1 g total) by mouth 4 (four) times daily.  valsartan 160 MG tablet Commonly known as:  DIOVAN Take 160 mg by mouth every morning. Reported on 11/05/2015   vitamin B-1 250 MG tablet Take 250 mg by mouth every morning.       Allergies:  Allergies  Allergen Reactions  . Oxycodone Swelling and Other (See Comments)    Tongue and lip    Past Medical History, Surgical history, Social history, and Family History were reviewed and updated.  Review of Systems: All other 10 point review of systems is negative.   Physical Exam:  weight is 155 lb (70.3 kg). His oral temperature is 97.9 F (36.6 C). His blood pressure is 150/60 (abnormal) and his pulse is 58 (abnormal).   Wt Readings from Last 3 Encounters:  03/03/16 155 lb (70.3 kg)  02/12/16 152 lb (68.9 kg)  02/11/16 152 lb (68.9 kg)     Well-developed and well-nourished white male in no obvious distress. Head and neck exam shows no ocular or oral lesions. No adenopathy noted in the neck. Lungs are clear. Neck exam regular in rhythm with no murmurs, rubs or bruits. Abdomen is soft. Has good bowel sounds. There is no fluid wave. There is no palpable liver or spleen tip. Axillar exam shows no bilateral axillary adenopathy. Back exam shows no tenderness over the spine, ribs or hips. Externally shows no clubbing, cyanosis or edema. He has no tenderness over his long bones. He has good range of motion of his joints. Skin exam shows areas of vitiligo. Neurological exam shows no focal deficits.  Lab Results  Component Value Date   WBC 4.3 03/03/2016   HGB 9.0 (L) 03/03/2016   HCT 26.4 (L) 03/03/2016   MCV 84 03/03/2016   PLT 118 (L) 03/03/2016   No results found for: FERRITIN, IRON, TIBC, UIBC, IRONPCTSAT Lab Results  Component Value Date   RBC 3.14 (L) 03/03/2016   No results found for: KPAFRELGTCHN, LAMBDASER, KAPLAMBRATIO No results found for: IGGSERUM, IGA, IGMSERUM No results found for: Odetta Pink, SPEI   Chemistry      Component Value Date/Time   NA 136 03/03/2016 0804   K 3.1 (L) 03/03/2016 0804   CL 108 03/03/2016 0804   CO2 26 03/03/2016 0804   BUN 10 03/03/2016 0804   CREATININE 1.0 03/03/2016 0804      Component Value Date/Time   CALCIUM 8.1 03/03/2016 0804   ALKPHOS 119 (H) 03/03/2016 0804   AST 22 03/03/2016 0804   ALT 14 03/03/2016 0804   BILITOT 0.50 03/03/2016 0804     Impression and Plan: Gilbert [name] is a pleasant 65 yo white male with history of clear cell carcinoma right kidney resected back in March 2015. He has now been diagnosed with diffuse large cell non-Hodgkin's lymphoma. Cytogenetics were negative for "double hit" lymphoma.  MUGA showed an EF of 42%. He is now being treated with R-CEOP and doing fairly well.   He has responded very  nicely. As such, we'll continue him on therapy.  Because of his cardiac disease, we will he cannot use Adriamycin. However, the etoposide seems to be working very nicely.   We will proceed with cycle #3 of therapy. I do believe he will need 8 cycles.   I'll get him back in 3 weeks.  I spent about 25 minutes with he and his daughter.  Volanda Napoleon, MD 8/2/20179:10 AM

## 2016-03-04 ENCOUNTER — Ambulatory Visit: Payer: Medicare Other | Admitting: Hematology & Oncology

## 2016-03-04 ENCOUNTER — Other Ambulatory Visit: Payer: Medicare Other

## 2016-03-04 ENCOUNTER — Ambulatory Visit (HOSPITAL_BASED_OUTPATIENT_CLINIC_OR_DEPARTMENT_OTHER): Payer: Medicare Other

## 2016-03-04 ENCOUNTER — Ambulatory Visit: Payer: Medicare Other

## 2016-03-04 VITALS — BP 142/80 | HR 60 | Temp 98.2°F | Resp 16

## 2016-03-04 DIAGNOSIS — C8339 Diffuse large B-cell lymphoma, extranodal and solid organ sites: Secondary | ICD-10-CM

## 2016-03-04 DIAGNOSIS — C8580 Other specified types of non-Hodgkin lymphoma, unspecified site: Secondary | ICD-10-CM

## 2016-03-04 DIAGNOSIS — Z5111 Encounter for antineoplastic chemotherapy: Secondary | ICD-10-CM | POA: Diagnosis not present

## 2016-03-04 DIAGNOSIS — C8589 Other specified types of non-Hodgkin lymphoma, extranodal and solid organ sites: Secondary | ICD-10-CM

## 2016-03-04 DIAGNOSIS — C8330 Diffuse large B-cell lymphoma, unspecified site: Secondary | ICD-10-CM

## 2016-03-04 MED ORDER — PROCHLORPERAZINE MALEATE 10 MG PO TABS
10.0000 mg | ORAL_TABLET | Freq: Once | ORAL | Status: AC
  Administered 2016-03-04: 10 mg via ORAL

## 2016-03-04 MED ORDER — PROCHLORPERAZINE MALEATE 10 MG PO TABS
ORAL_TABLET | ORAL | Status: AC
  Filled 2016-03-04: qty 1

## 2016-03-04 MED ORDER — SODIUM CHLORIDE 0.9 % IV SOLN
Freq: Once | INTRAVENOUS | Status: AC
  Administered 2016-03-04: 09:00:00 via INTRAVENOUS

## 2016-03-04 MED ORDER — HEPARIN SOD (PORK) LOCK FLUSH 100 UNIT/ML IV SOLN
500.0000 [IU] | Freq: Once | INTRAVENOUS | Status: AC | PRN
Start: 2016-03-04 — End: 2016-03-04
  Administered 2016-03-04: 500 [IU]
  Filled 2016-03-04: qty 5

## 2016-03-04 MED ORDER — SODIUM CHLORIDE 0.9 % IV SOLN
50.0000 mg/m2 | Freq: Once | INTRAVENOUS | Status: AC
  Administered 2016-03-04: 90 mg via INTRAVENOUS
  Filled 2016-03-04: qty 4.5

## 2016-03-04 MED ORDER — SODIUM CHLORIDE 0.9% FLUSH
10.0000 mL | INTRAVENOUS | Status: DC | PRN
  Administered 2016-03-04: 10 mL
  Filled 2016-03-04: qty 10

## 2016-03-04 MED FILL — PROCHLORPERAZINE 10 MG TAB: 10 | 7 days supply | Qty: 30 | Fill #0

## 2016-03-04 NOTE — Patient Instructions (Signed)
[facility] Discharge Instructions for Patients Receiving Chemotherapy  Today you received the following chemotherapy agents:  Etoposide  To help prevent nausea and vomiting after your treatment, we encourage you to take your nausea medication.   If you develop nausea and vomiting that is not controlled by your nausea medication, call the clinic.   BELOW ARE SYMPTOMS THAT SHOULD BE REPORTED IMMEDIATELY:  *FEVER GREATER THAN 100.5 F  *CHILLS WITH OR WITHOUT FEVER  NAUSEA AND VOMITING THAT IS NOT CONTROLLED WITH YOUR NAUSEA MEDICATION  *UNUSUAL SHORTNESS OF BREATH  *UNUSUAL BRUISING OR BLEEDING  TENDERNESS IN MOUTH AND THROAT WITH OR WITHOUT PRESENCE OF ULCERS  *URINARY PROBLEMS  *BOWEL PROBLEMS  UNUSUAL RASH Items with * indicate a potential emergency and should be followed up as soon as possible.  Feel free to call the clinic you have any questions or concerns. The clinic phone number is [phone].  Please show the CHEMO ALERT CARD at check-in to the Emergency Department and triage nurse.   

## 2016-03-05 ENCOUNTER — Ambulatory Visit (HOSPITAL_BASED_OUTPATIENT_CLINIC_OR_DEPARTMENT_OTHER): Payer: Medicare Other

## 2016-03-05 VITALS — BP 156/56 | HR 51 | Temp 98.2°F | Resp 16

## 2016-03-05 DIAGNOSIS — C8339 Diffuse large B-cell lymphoma, extranodal and solid organ sites: Secondary | ICD-10-CM

## 2016-03-05 DIAGNOSIS — C8330 Diffuse large B-cell lymphoma, unspecified site: Secondary | ICD-10-CM

## 2016-03-05 DIAGNOSIS — C8589 Other specified types of non-Hodgkin lymphoma, extranodal and solid organ sites: Secondary | ICD-10-CM

## 2016-03-05 DIAGNOSIS — C8580 Other specified types of non-Hodgkin lymphoma, unspecified site: Secondary | ICD-10-CM

## 2016-03-05 DIAGNOSIS — Z5111 Encounter for antineoplastic chemotherapy: Secondary | ICD-10-CM

## 2016-03-05 MED ORDER — SODIUM CHLORIDE 0.9 % IV SOLN
50.0000 mg/m2 | Freq: Once | INTRAVENOUS | Status: AC
  Administered 2016-03-05: 90 mg via INTRAVENOUS
  Filled 2016-03-05: qty 4.5

## 2016-03-05 MED ORDER — HEPARIN SOD (PORK) LOCK FLUSH 100 UNIT/ML IV SOLN
500.0000 [IU] | Freq: Once | INTRAVENOUS | Status: AC | PRN
  Administered 2016-03-05: 500 [IU]
  Filled 2016-03-05: qty 5

## 2016-03-05 MED ORDER — PROCHLORPERAZINE MALEATE 10 MG PO TABS
10.0000 mg | ORAL_TABLET | Freq: Once | ORAL | Status: AC
  Administered 2016-03-05: 10 mg via ORAL

## 2016-03-05 MED ORDER — SODIUM CHLORIDE 0.9 % IV SOLN
Freq: Once | INTRAVENOUS | Status: AC
  Administered 2016-03-05: 09:00:00 via INTRAVENOUS

## 2016-03-05 MED ORDER — SODIUM CHLORIDE 0.9% FLUSH
10.0000 mL | INTRAVENOUS | Status: DC | PRN
  Administered 2016-03-05: 10 mL
  Filled 2016-03-05: qty 10

## 2016-03-05 MED ORDER — PROCHLORPERAZINE MALEATE 10 MG PO TABS
ORAL_TABLET | ORAL | Status: AC
  Filled 2016-03-05: qty 1

## 2016-03-05 NOTE — Patient Instructions (Signed)
[facility] Discharge Instructions for Patients Receiving Chemotherapy  Today you received the following chemotherapy agents:  Etoposide  To help prevent nausea and vomiting after your treatment, we encourage you to take your nausea medication.   If you develop nausea and vomiting that is not controlled by your nausea medication, call the clinic.   BELOW ARE SYMPTOMS THAT SHOULD BE REPORTED IMMEDIATELY:  *FEVER GREATER THAN 100.5 F  *CHILLS WITH OR WITHOUT FEVER  NAUSEA AND VOMITING THAT IS NOT CONTROLLED WITH YOUR NAUSEA MEDICATION  *UNUSUAL SHORTNESS OF BREATH  *UNUSUAL BRUISING OR BLEEDING  TENDERNESS IN MOUTH AND THROAT WITH OR WITHOUT PRESENCE OF ULCERS  *URINARY PROBLEMS  *BOWEL PROBLEMS  UNUSUAL RASH Items with * indicate a potential emergency and should be followed up as soon as possible.  Feel free to call the clinic you have any questions or concerns. The clinic phone number is [phone].  Please show the CHEMO ALERT CARD at check-in to the Emergency Department and triage nurse.   

## 2016-03-24 ENCOUNTER — Other Ambulatory Visit (HOSPITAL_BASED_OUTPATIENT_CLINIC_OR_DEPARTMENT_OTHER): Payer: Medicare Other

## 2016-03-24 ENCOUNTER — Ambulatory Visit (HOSPITAL_BASED_OUTPATIENT_CLINIC_OR_DEPARTMENT_OTHER): Payer: Medicare Other

## 2016-03-24 ENCOUNTER — Ambulatory Visit: Payer: Medicare Other | Admitting: Hematology & Oncology

## 2016-03-24 ENCOUNTER — Encounter: Payer: Self-pay | Admitting: Hematology & Oncology

## 2016-03-24 ENCOUNTER — Ambulatory Visit (HOSPITAL_BASED_OUTPATIENT_CLINIC_OR_DEPARTMENT_OTHER): Payer: Medicare Other | Admitting: Hematology & Oncology

## 2016-03-24 ENCOUNTER — Other Ambulatory Visit: Payer: Medicare Other

## 2016-03-24 ENCOUNTER — Ambulatory Visit: Payer: Medicare Other

## 2016-03-24 ENCOUNTER — Other Ambulatory Visit: Payer: Self-pay | Admitting: *Deleted

## 2016-03-24 VITALS — BP 135/65 | HR 67 | Temp 97.8°F | Resp 18 | Ht 70.0 in | Wt 146.0 lb

## 2016-03-24 VITALS — BP 108/60 | HR 67 | Temp 98.0°F | Resp 18

## 2016-03-24 DIAGNOSIS — C8589 Other specified types of non-Hodgkin lymphoma, extranodal and solid organ sites: Secondary | ICD-10-CM

## 2016-03-24 DIAGNOSIS — C8330 Diffuse large B-cell lymphoma, unspecified site: Secondary | ICD-10-CM

## 2016-03-24 DIAGNOSIS — Z5112 Encounter for antineoplastic immunotherapy: Secondary | ICD-10-CM | POA: Diagnosis not present

## 2016-03-24 DIAGNOSIS — C8580 Other specified types of non-Hodgkin lymphoma, unspecified site: Secondary | ICD-10-CM

## 2016-03-24 DIAGNOSIS — R509 Fever, unspecified: Secondary | ICD-10-CM

## 2016-03-24 DIAGNOSIS — R634 Abnormal weight loss: Secondary | ICD-10-CM | POA: Diagnosis not present

## 2016-03-24 DIAGNOSIS — C8339 Diffuse large B-cell lymphoma, extranodal and solid organ sites: Secondary | ICD-10-CM

## 2016-03-24 DIAGNOSIS — Z5111 Encounter for antineoplastic chemotherapy: Secondary | ICD-10-CM

## 2016-03-24 LAB — CMP (CANCER CENTER ONLY)
ALT(SGPT): 11 U/L (ref 10–47)
AST: 18 U/L (ref 11–38)
Albumin: 2.3 g/dL — ABNORMAL LOW (ref 3.3–5.5)
Alkaline Phosphatase: 109 U/L — ABNORMAL HIGH (ref 26–84)
BUN: 10 mg/dL (ref 7–22)
CHLORIDE: 105 meq/L (ref 98–108)
CO2: 25 mEq/L (ref 18–33)
CREATININE: 1.3 mg/dL — AB (ref 0.6–1.2)
Calcium: 8.9 mg/dL (ref 8.0–10.3)
GLUCOSE: 164 mg/dL — AB (ref 73–118)
POTASSIUM: 3.1 meq/L — AB (ref 3.3–4.7)
SODIUM: 134 meq/L (ref 128–145)
TOTAL PROTEIN: 6 g/dL — AB (ref 6.4–8.1)
Total Bilirubin: 0.6 mg/dl (ref 0.20–1.60)

## 2016-03-24 LAB — CBC WITH DIFFERENTIAL (CANCER CENTER ONLY)
BASO#: 0 10*3/uL (ref 0.0–0.2)
BASO%: 0.3 % (ref 0.0–2.0)
EOS ABS: 0.1 10*3/uL (ref 0.0–0.5)
EOS%: 0.6 % (ref 0.0–7.0)
HCT: 26.7 % — ABNORMAL LOW (ref 38.7–49.9)
HGB: 9 g/dL — ABNORMAL LOW (ref 13.0–17.1)
LYMPH#: 0.4 10*3/uL — ABNORMAL LOW (ref 0.9–3.3)
LYMPH%: 4.1 % — AB (ref 14.0–48.0)
MCH: 28.4 pg (ref 28.0–33.4)
MCHC: 33.7 g/dL (ref 32.0–35.9)
MCV: 84 fL (ref 82–98)
MONO#: 0.2 10*3/uL (ref 0.1–0.9)
MONO%: 2.6 % (ref 0.0–13.0)
NEUT#: 8.3 10*3/uL — ABNORMAL HIGH (ref 1.5–6.5)
NEUT%: 92.4 % — AB (ref 40.0–80.0)
PLATELETS: 203 10*3/uL (ref 145–400)
RBC: 3.17 10*6/uL — AB (ref 4.20–5.70)
RDW: 17.1 % — AB (ref 11.1–15.7)
WBC: 8.9 10*3/uL (ref 4.0–10.0)

## 2016-03-24 LAB — LACTATE DEHYDROGENASE: LDH: 133 U/L (ref 125–245)

## 2016-03-24 MED ORDER — SODIUM CHLORIDE 0.9 % IV SOLN
Freq: Once | INTRAVENOUS | Status: AC
  Administered 2016-03-24: 11:00:00 via INTRAVENOUS

## 2016-03-24 MED ORDER — PALONOSETRON HCL INJECTION 0.25 MG/5ML
0.2500 mg | Freq: Once | INTRAVENOUS | Status: AC
  Administered 2016-03-24: 0.25 mg via INTRAVENOUS

## 2016-03-24 MED ORDER — ACETAMINOPHEN 325 MG PO TABS
ORAL_TABLET | ORAL | Status: AC
  Filled 2016-03-24: qty 2

## 2016-03-24 MED ORDER — DIPHENHYDRAMINE HCL 25 MG PO CAPS
ORAL_CAPSULE | ORAL | Status: AC
  Filled 2016-03-24: qty 2

## 2016-03-24 MED ORDER — MEGESTROL ACETATE 400 MG/10ML PO SUSP: 800.0000 mg | Freq: Every day | ORAL | 0 refills | Status: DC

## 2016-03-24 MED ORDER — HEPARIN SOD (PORK) LOCK FLUSH 100 UNIT/ML IV SOLN
500.0000 [IU] | Freq: Once | INTRAVENOUS | Status: AC | PRN
  Administered 2016-03-24: 500 [IU]
  Filled 2016-03-24: qty 5

## 2016-03-24 MED ORDER — VINCRISTINE SULFATE CHEMO INJECTION 1 MG/ML
2.0000 mg | Freq: Once | INTRAVENOUS | Status: AC
  Administered 2016-03-24: 2 mg via INTRAVENOUS
  Filled 2016-03-24: qty 2

## 2016-03-24 MED ORDER — LIDOCAINE-PRILOCAINE 2.5-2.5 % EX CREA: 1.0000 "application " | TOPICAL_CREAM | Freq: Every day | CUTANEOUS | 5 refills | Status: DC | PRN

## 2016-03-24 MED ORDER — SODIUM CHLORIDE 0.9 % IV SOLN
Freq: Once | INTRAVENOUS | Status: AC
  Administered 2016-03-24: 11:00:00 via INTRAVENOUS
  Filled 2016-03-24: qty 5

## 2016-03-24 MED ORDER — DIPHENHYDRAMINE HCL 25 MG PO CAPS
50.0000 mg | ORAL_CAPSULE | Freq: Once | ORAL | Status: AC
  Administered 2016-03-24: 50 mg via ORAL

## 2016-03-24 MED ORDER — ETOPOSIDE CHEMO INJECTION 1 GM/50ML
45.0000 mg/m2 | Freq: Once | INTRAVENOUS | Status: AC
  Administered 2016-03-24: 80 mg via INTRAVENOUS
  Filled 2016-03-24: qty 4

## 2016-03-24 MED ORDER — SODIUM CHLORIDE 0.9 % IV SOLN
375.0000 mg/m2 | Freq: Once | INTRAVENOUS | Status: AC
  Administered 2016-03-24: 700 mg via INTRAVENOUS
  Filled 2016-03-24: qty 50

## 2016-03-24 MED ORDER — PALONOSETRON HCL INJECTION 0.25 MG/5ML
INTRAVENOUS | Status: AC
  Filled 2016-03-24: qty 5

## 2016-03-24 MED ORDER — SODIUM CHLORIDE 0.9 % IV SOLN
675.0000 mg/m2 | Freq: Once | INTRAVENOUS | Status: AC
  Administered 2016-03-24: 1260 mg via INTRAVENOUS
  Filled 2016-03-24: qty 63

## 2016-03-24 MED ORDER — ACETAMINOPHEN 325 MG PO TABS
650.0000 mg | ORAL_TABLET | Freq: Once | ORAL | Status: AC
  Administered 2016-03-24: 650 mg via ORAL

## 2016-03-24 MED ORDER — DRONABINOL 5 MG PO CAPS: 5.0000 mg | ORAL_CAPSULE | Freq: Two times a day (BID) | ORAL | 0 refills | Status: DC

## 2016-03-24 MED ORDER — SODIUM CHLORIDE 0.9% FLUSH
10.0000 mL | INTRAVENOUS | Status: DC | PRN
  Administered 2016-03-24: 10 mL
  Filled 2016-03-24: qty 10

## 2016-03-24 MED ORDER — CIPROFLOXACIN HCL 500 MG PO TABS: ORAL_TABLET | ORAL | 4 refills | Status: DC

## 2016-03-24 MED FILL — MEGESTROL ACET 40 MG/ML SUS: 40 | 12 days supply | Qty: 240 | Fill #0

## 2016-03-24 MED FILL — LIDOCAINE-PRILOCAINE CREAM: 2.5-2.5 | 15 days supply | Qty: 30 | Fill #0

## 2016-03-24 MED FILL — CIPROFLOXACIN HCL 500 MG TA: 500 | 15 days supply | Qty: 30 | Fill #0

## 2016-03-24 NOTE — Patient Instructions (Signed)
[place] Discharge Instructions for Patients Receiving Chemotherapy  Today you received the following chemotherapy agents Etoposide, Rituxan Cytoxan and Oncovin  Take home medications for Nausea:  1) Ondansetron (ZOFRAN) 8 MG tablet      Take 1 tablet (8 mg total) by mouth 2 (two) times daily. Start the day after chemo for 3 days. Then take as needed for nausea or vomiting.        2) PredniSONE (DELTASONE) 20 MG tablet     Take 3 tablets (60 mg total) by mouth daily. Take on days 1-5 of chemotherapy.         3) Prochlorperazine (COMPAZINE) 10 MG tablet      Take 1 tablet (10 mg total) by mouth every 6 (six) hours as needed (Nausea or vomiting).            To help prevent nausea and vomiting after your treatment, we encourage you to take your nausea medication   If you develop nausea and vomiting that is not controlled by your nausea medication, call the clinic. If it is after clinic hours your family physician or the after hours number for the clinic or go to the Emergency Department.   BELOW ARE SYMPTOMS THAT SHOULD BE REPORTED IMMEDIATELY:  *FEVER GREATER THAN 100.5 F  *CHILLS WITH OR WITHOUT FEVER  NAUSEA AND VOMITING THAT IS NOT CONTROLLED WITH YOUR NAUSEA MEDICATION  *UNUSUAL SHORTNESS OF BREATH  *UNUSUAL BRUISING OR BLEEDING  TENDERNESS IN MOUTH AND THROAT WITH OR WITHOUT PRESENCE OF ULCERS  *URINARY PROBLEMS  *BOWEL PROBLEMS  UNUSUAL RASH Items with * indicate a potential emergency and should be followed up as soon as possible.  One of the nurses will contact you 24 hours after your treatment. Please let the nurse know about any problems that you [place] have experienced. Feel free to call the clinic you have any questions or concerns. The clinic phone number is [phone]   I have been informed and understand all the instructions given to me. I know to contact the clinic, my physician, or go to the Emergency Department if any problems should  occur. I do not have any questions at this time, but understand that I Apel call the clinic during office hours   should I have any questions or need assistance in obtaining follow up care.    __________________________________________  _____________  __________ Signature of Patient or Authorized Representative            Date                   Time    __________________________________________ Nurse's Signature

## 2016-03-24 NOTE — Progress Notes (Signed)
Hematology and Oncology Follow Up Visit  Bronsen Tajima [id number] January 23, 1951 65 y.o. 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 Lacina is here today with his daughter for follow-up.  His main problem is that he is losing some weight. This is troublesome to me. He just does not have much of an appetite. I'll go ahead and put him on some [name]. We will try him on 5 mg twice a day of [name].  He is not complaining of any pain. He's had no nausea or vomiting.  He did have a little bit of a temperature with the last cycle. His temperature went to 102. He did not go to the emergency room. I'm sure that he was neutropenic. I want to make sure that he gets on antibiotics after this cycle. I'll time on some Cipro.  He has had no diarrhea. He has had no bleeding. He has had no chest pain. He's had no problems with congestive heart failure.   Overall, his performance status is ECOG 1.     Medications:    Medication List       Accurate as of 03/24/16 10:26 AM. Always use your most recent med list.          amLODipine 10 MG tablet Commonly known as:  NORVASC Take 10 mg by mouth daily.   aspirin EC 81 MG tablet Take 1 tablet (81 mg total) by mouth daily.   atorvastatin 80 MG tablet Commonly known as:  LIPITOR Take 80 mg by mouth daily.   B-12 5000 MCG Caps Take 5,000 mcg by mouth every morning.   carvedilol 12.5 MG tablet Commonly known as:  COREG Take 12.5 mg by mouth 2 (two) times daily with a meal.   clopidogrel 75 MG tablet Commonly known as:  PLAVIX Take 75 mg by mouth daily.   cyclobenzaprine 10 MG tablet Commonly known as:  FLEXERIL Take 1 tablet (10 mg total) by mouth 3 (three) times daily as needed for muscle spasms.   dronabinol 5 MG capsule Commonly known as:  [name] Take 1 capsule  (5 mg total) by mouth 2 (two) times daily before a meal.   lidocaine-prilocaine cream Commonly known as:  EMLA Apply 1 application topically daily as needed (port access). Apply to affected area once   LORazepam 0.5 MG tablet Commonly known as:  ATIVAN Take 1 tablet (0.5 mg total) by mouth every 8 (eight) hours.   METAMUCIL PO Take 3 capsules by mouth every evening. Reported on 10/24/2015   ondansetron 8 MG tablet Commonly known as:  ZOFRAN Take 1 tablet (8 mg total) by mouth 2 (two) times daily as needed for refractory nausea / vomiting. Start on day 3 after cyclophosphamide chemotherapy.   pantoprazole 20 MG tablet Commonly known as:  PROTONIX Take 1 tablet (20 mg total) by mouth daily.   PHENYTEK 300 MG ER capsule Generic drug:  phenytoin Take 1 capsule (300 mg total) by mouth every morning.   polyethylene glycol packet Commonly known as:  MIRALAX / GLYCOLAX Take 17 g by mouth daily as needed for moderate constipation.   predniSONE 20 MG tablet Commonly known as:  DELTASONE Take 3 tablets (60 mg total) by mouth daily. Take on days 1-5 of chemotherapy.   prochlorperazine 10 MG tablet Commonly known  as:  COMPAZINE Take 1 tablet (10 mg total) by mouth every 6 (six) hours as needed (Nausea or vomiting).   pyridOXINE 100 MG tablet Commonly known as:  VITAMIN B-6 Take 100 mg by mouth every morning.   sucralfate 1 g tablet Commonly known as:  CARAFATE Take 1 tablet (1 g total) by mouth 4 (four) times daily.   valsartan 160 MG tablet Commonly known as:  DIOVAN Take 160 mg by mouth every morning. Reported on 11/05/2015   vitamin B-1 250 MG tablet Take 250 mg by mouth every morning.       Allergies:  Allergies  Allergen Reactions  . Oxycodone Swelling and Other (See Comments)    Tongue and lip    Past Medical History, Surgical history, Social history, and Family History were reviewed and updated.  Review of Systems: All other 10 point review of systems is  negative.   Physical Exam:  height is 5\' 10"  (1.778 m) and weight is 146 lb (66.2 kg). His oral temperature is 97.8 F (36.6 C). His blood pressure is 135/65 and his pulse is 67. His respiration is 18.   Wt Readings from Last 3 Encounters:  03/24/16 146 lb (66.2 kg)  03/03/16 155 lb (70.3 kg)  02/12/16 152 lb (68.9 kg)    Well-developed and well-nourished white male in no obvious distress. Head and neck exam shows no ocular or oral lesions. No adenopathy noted in the neck. Lungs are clear. Neck exam regular in rhythm with no murmurs, rubs or bruits. Abdomen is soft. Has good bowel sounds. There is no fluid wave. There is no palpable liver or spleen tip. Axillar exam shows no bilateral axillary adenopathy. Back exam shows no tenderness over the spine, ribs or hips. Externally shows no clubbing, cyanosis or edema. He has no tenderness over his long bones. He has good range of motion of his joints. Skin exam shows areas of vitiligo. Neurological exam shows no focal deficits.  Lab Results  Component Value Date   WBC 8.9 03/24/2016   HGB 9.0 (L) 03/24/2016   HCT 26.7 (L) 03/24/2016   MCV 84 03/24/2016   PLT 203 03/24/2016   No results found for: FERRITIN, IRON, TIBC, UIBC, IRONPCTSAT Lab Results  Component Value Date   RBC 3.17 (L) 03/24/2016   No results found for: KPAFRELGTCHN, LAMBDASER, KAPLAMBRATIO No results found for: IGGSERUM, IGA, IGMSERUM No results found for: Odetta Pink, SPEI   Chemistry      Component Value Date/Time   NA 134 03/24/2016 0914   K 3.1 (L) 03/24/2016 0914   CL 105 03/24/2016 0914   CO2 25 03/24/2016 0914   BUN 10 03/24/2016 0914   CREATININE 1.3 (H) 03/24/2016 0914      Component Value Date/Time   CALCIUM 8.9 03/24/2016 0914   ALKPHOS 109 (H) 03/24/2016 0914   AST 18 03/24/2016 0914   ALT 11 03/24/2016 0914   BILITOT 0.60 03/24/2016 0914     Impression and Plan: Mr. Wenke is a pleasant 65 yo  white male with history of clear cell carcinoma right kidney resected back in March 2015. He has now been diagnosed with diffuse large cell non-Hodgkin's lymphoma. Cytogenetics were negative for "double hit" lymphoma.  MUGA showed an EF of 42%. He is now being treated with R-CEOP.  We'll go ahead with his fourth cycle of treatment. I will repeat a PET scan after this cycle. I think if the PET scan is negative for  activity, we probably can go 2 additional cycles. If there is some areas of concern, and we probably will have to go with 4 more cycles.  The weight loss is somewhat troublesome for me. Hopefully, the [name] will help.  Again, we will do a PET scan.  I'll get him back in 3 weeks.  I spent about 25 minutes with he and his daughter.  Volanda Napoleon, MD 8/23/201710:26 AM

## 2016-03-24 NOTE — Addendum Note (Signed)
Addended by: Burney Gauze R on: 03/24/2016 10:40 AM   Modules accepted: Orders

## 2016-03-25 ENCOUNTER — Ambulatory Visit (HOSPITAL_BASED_OUTPATIENT_CLINIC_OR_DEPARTMENT_OTHER): Payer: Medicare Other

## 2016-03-25 ENCOUNTER — Other Ambulatory Visit: Payer: Medicare Other

## 2016-03-25 ENCOUNTER — Ambulatory Visit: Payer: Medicare Other

## 2016-03-25 ENCOUNTER — Ambulatory Visit: Payer: Medicare Other | Admitting: Hematology & Oncology

## 2016-03-25 VITALS — BP 145/65 | HR 68 | Temp 98.0°F | Resp 18

## 2016-03-25 DIAGNOSIS — C8580 Other specified types of non-Hodgkin lymphoma, unspecified site: Secondary | ICD-10-CM

## 2016-03-25 DIAGNOSIS — Z5111 Encounter for antineoplastic chemotherapy: Secondary | ICD-10-CM | POA: Diagnosis not present

## 2016-03-25 DIAGNOSIS — C8589 Other specified types of non-Hodgkin lymphoma, extranodal and solid organ sites: Secondary | ICD-10-CM

## 2016-03-25 DIAGNOSIS — C8339 Diffuse large B-cell lymphoma, extranodal and solid organ sites: Secondary | ICD-10-CM

## 2016-03-25 DIAGNOSIS — C8330 Diffuse large B-cell lymphoma, unspecified site: Secondary | ICD-10-CM

## 2016-03-25 MED ORDER — PROCHLORPERAZINE MALEATE 10 MG PO TABS
10.0000 mg | ORAL_TABLET | Freq: Once | ORAL | Status: AC
  Administered 2016-03-25: 10 mg via ORAL

## 2016-03-25 MED ORDER — SODIUM CHLORIDE 0.9 % IV SOLN
Freq: Once | INTRAVENOUS | Status: AC
  Administered 2016-03-25: 09:00:00 via INTRAVENOUS

## 2016-03-25 MED ORDER — PROCHLORPERAZINE MALEATE 10 MG PO TABS
ORAL_TABLET | ORAL | Status: AC
  Filled 2016-03-25: qty 1

## 2016-03-25 MED ORDER — HEPARIN SOD (PORK) LOCK FLUSH 100 UNIT/ML IV SOLN
500.0000 [IU] | Freq: Once | INTRAVENOUS | Status: AC | PRN
  Administered 2016-03-25: 500 [IU]
  Filled 2016-03-25: qty 5

## 2016-03-25 MED ORDER — SODIUM CHLORIDE 0.9% FLUSH
10.0000 mL | INTRAVENOUS | Status: DC | PRN
  Administered 2016-03-25: 10 mL
  Filled 2016-03-25: qty 10

## 2016-03-25 MED ORDER — SODIUM CHLORIDE 0.9 % IV SOLN
45.0000 mg/m2 | Freq: Once | INTRAVENOUS | Status: AC
  Administered 2016-03-25: 80 mg via INTRAVENOUS
  Filled 2016-03-25: qty 4

## 2016-03-25 NOTE — Patient Instructions (Signed)
[facility] Discharge Instructions for Patients Receiving Chemotherapy  Today you received the following chemotherapy agents VP -16 To help prevent nausea and vomiting after your treatment, we encourage you to take your nausea medication as prescribed.   If you develop nausea and vomiting that is not controlled by your nausea medication, call the clinic.   BELOW ARE SYMPTOMS THAT SHOULD BE REPORTED IMMEDIATELY:  *FEVER GREATER THAN 100.5 F  *CHILLS WITH OR WITHOUT FEVER  NAUSEA AND VOMITING THAT IS NOT CONTROLLED WITH YOUR NAUSEA MEDICATION  *UNUSUAL SHORTNESS OF BREATH  *UNUSUAL BRUISING OR BLEEDING  TENDERNESS IN MOUTH AND THROAT WITH OR WITHOUT PRESENCE OF ULCERS  *URINARY PROBLEMS  *BOWEL PROBLEMS  UNUSUAL RASH Items with * indicate a potential emergency and should be followed up as soon as possible.  Feel free to call the clinic you have any questions or concerns. The clinic phone number is [phone].  Please show the CHEMO ALERT CARD at check-in to the Emergency Department and triage nurse.   

## 2016-03-26 ENCOUNTER — Ambulatory Visit (HOSPITAL_BASED_OUTPATIENT_CLINIC_OR_DEPARTMENT_OTHER): Payer: Medicare Other

## 2016-03-26 VITALS — BP 133/60 | HR 63 | Temp 97.5°F | Resp 16

## 2016-03-26 DIAGNOSIS — C8339 Diffuse large B-cell lymphoma, extranodal and solid organ sites: Secondary | ICD-10-CM

## 2016-03-26 DIAGNOSIS — Z5111 Encounter for antineoplastic chemotherapy: Secondary | ICD-10-CM

## 2016-03-26 DIAGNOSIS — C8330 Diffuse large B-cell lymphoma, unspecified site: Secondary | ICD-10-CM

## 2016-03-26 DIAGNOSIS — C8580 Other specified types of non-Hodgkin lymphoma, unspecified site: Secondary | ICD-10-CM

## 2016-03-26 DIAGNOSIS — C8589 Other specified types of non-Hodgkin lymphoma, extranodal and solid organ sites: Secondary | ICD-10-CM

## 2016-03-26 MED ORDER — HEPARIN SOD (PORK) LOCK FLUSH 100 UNIT/ML IV SOLN
500.0000 [IU] | Freq: Once | INTRAVENOUS | Status: AC | PRN
  Administered 2016-03-26: 500 [IU]
  Filled 2016-03-26: qty 5

## 2016-03-26 MED ORDER — PROCHLORPERAZINE MALEATE 10 MG PO TABS
ORAL_TABLET | ORAL | Status: AC
  Filled 2016-03-26: qty 1

## 2016-03-26 MED ORDER — SODIUM CHLORIDE 0.9 % IV SOLN
45.0000 mg/m2 | Freq: Once | INTRAVENOUS | Status: AC
  Administered 2016-03-26: 80 mg via INTRAVENOUS
  Filled 2016-03-26: qty 4

## 2016-03-26 MED ORDER — SODIUM CHLORIDE 0.9 % IV SOLN
Freq: Once | INTRAVENOUS | Status: AC
  Administered 2016-03-26: 09:00:00 via INTRAVENOUS

## 2016-03-26 MED ORDER — PROCHLORPERAZINE MALEATE 10 MG PO TABS
10.0000 mg | ORAL_TABLET | Freq: Once | ORAL | Status: AC
  Administered 2016-03-26: 10 mg via ORAL

## 2016-03-26 MED ORDER — SODIUM CHLORIDE 0.9% FLUSH
10.0000 mL | INTRAVENOUS | Status: DC | PRN
  Administered 2016-03-26: 10 mL
  Filled 2016-03-26: qty 10

## 2016-03-26 NOTE — Patient Instructions (Signed)
[facility] Discharge Instructions for Patients Receiving Chemotherapy  Today you received the following chemotherapy agents:  Etoposide  To help prevent nausea and vomiting after your treatment, we encourage you to take your nausea medication.   If you develop nausea and vomiting that is not controlled by your nausea medication, call the clinic.   BELOW ARE SYMPTOMS THAT SHOULD BE REPORTED IMMEDIATELY:  *FEVER GREATER THAN 100.5 F  *CHILLS WITH OR WITHOUT FEVER  NAUSEA AND VOMITING THAT IS NOT CONTROLLED WITH YOUR NAUSEA MEDICATION  *UNUSUAL SHORTNESS OF BREATH  *UNUSUAL BRUISING OR BLEEDING  TENDERNESS IN MOUTH AND THROAT WITH OR WITHOUT PRESENCE OF ULCERS  *URINARY PROBLEMS  *BOWEL PROBLEMS  UNUSUAL RASH Items with * indicate a potential emergency and should be followed up as soon as possible.  Feel free to call the clinic you have any questions or concerns. The clinic phone number is [phone].  Please show the CHEMO ALERT CARD at check-in to the Emergency Department and triage nurse.   

## 2016-03-29 ENCOUNTER — Other Ambulatory Visit: Payer: Self-pay | Admitting: *Deleted

## 2016-03-29 DIAGNOSIS — C8589 Other specified types of non-Hodgkin lymphoma, extranodal and solid organ sites: Secondary | ICD-10-CM

## 2016-03-29 DIAGNOSIS — C8330 Diffuse large B-cell lymphoma, unspecified site: Secondary | ICD-10-CM

## 2016-03-29 DIAGNOSIS — C8580 Other specified types of non-Hodgkin lymphoma, unspecified site: Secondary | ICD-10-CM

## 2016-03-29 MED ORDER — DRONABINOL 5 MG PO CAPS: 5.0000 mg | ORAL_CAPSULE | Freq: Two times a day (BID) | ORAL | 0 refills | Status: DC

## 2016-03-29 MED FILL — DRONABINOL 5 MG CAPSULE: 5 | 30 days supply | Qty: 60 | Fill #0

## 2016-03-29 NOTE — Telephone Encounter (Signed)
Received call from Janett Billow, patient daughter stating that her dad would like to try the [name] for appetite.  Called rx downstairs

## 2016-04-07 ENCOUNTER — Ambulatory Visit (HOSPITAL_COMMUNITY)
Admission: RE | Admit: 2016-04-07 | Discharge: 2016-04-07 | Disposition: A | Discharge status: Home / Self Care | Payer: Medicare Other | Source: Ambulatory Visit | Attending: Hematology & Oncology | Admitting: Hematology & Oncology

## 2016-04-07 DIAGNOSIS — C8589 Other specified types of non-Hodgkin lymphoma, extranodal and solid organ sites: Secondary | ICD-10-CM

## 2016-04-07 DIAGNOSIS — I700 Atherosclerosis of aorta: Secondary | ICD-10-CM | POA: Insufficient documentation

## 2016-04-07 DIAGNOSIS — C8580 Other specified types of non-Hodgkin lymphoma, unspecified site: Secondary | ICD-10-CM | POA: Insufficient documentation

## 2016-04-07 DIAGNOSIS — C8339 Diffuse large B-cell lymphoma, extranodal and solid organ sites: Secondary | ICD-10-CM | POA: Diagnosis not present

## 2016-04-07 DIAGNOSIS — K5730 Diverticulosis of large intestine without perforation or abscess without bleeding: Secondary | ICD-10-CM | POA: Insufficient documentation

## 2016-04-07 DIAGNOSIS — C8330 Diffuse large B-cell lymphoma, unspecified site: Secondary | ICD-10-CM

## 2016-04-07 LAB — GLUCOSE, CAPILLARY: Glucose-Capillary: 79 mg/dL (ref 65–99)

## 2016-04-07 MED ORDER — FLUDEOXYGLUCOSE F - 18 (FDG) INJECTION
6.7000 | Freq: Once | INTRAVENOUS | Status: AC | PRN
  Administered 2016-04-07: 6.7 via INTRAVENOUS

## 2016-04-08 ENCOUNTER — Telehealth: Payer: Self-pay

## 2016-04-08 NOTE — Telephone Encounter (Addendum)
-----   Message from Volanda Napoleon, MD sent at 04/08/2016  2:17 PM EDT ----- Call - the lymphoma is still responding!!!  The PET scan looks a little beter!!  Great job!!  Gilbert [name]  Above message provided to pt via phone. Pt verbalizes understanding. dph

## 2016-04-14 ENCOUNTER — Other Ambulatory Visit (HOSPITAL_BASED_OUTPATIENT_CLINIC_OR_DEPARTMENT_OTHER): Payer: Medicare Other

## 2016-04-14 ENCOUNTER — Ambulatory Visit (HOSPITAL_COMMUNITY)
Admission: RE | Admit: 2016-04-14 | Discharge: 2016-04-14 | Disposition: A | Discharge status: Home / Self Care | Payer: Medicare Other | Source: Ambulatory Visit | Attending: Hematology & Oncology | Admitting: Hematology & Oncology

## 2016-04-14 ENCOUNTER — Ambulatory Visit (HOSPITAL_BASED_OUTPATIENT_CLINIC_OR_DEPARTMENT_OTHER): Payer: Medicare Other | Admitting: Hematology & Oncology

## 2016-04-14 ENCOUNTER — Encounter: Payer: Self-pay | Admitting: Hematology & Oncology

## 2016-04-14 ENCOUNTER — Other Ambulatory Visit: Payer: Self-pay | Admitting: *Deleted

## 2016-04-14 ENCOUNTER — Ambulatory Visit (HOSPITAL_BASED_OUTPATIENT_CLINIC_OR_DEPARTMENT_OTHER): Payer: Medicare Other

## 2016-04-14 VITALS — BP 142/57 | HR 55 | Temp 98.1°F | Resp 16 | Wt 147.5 lb

## 2016-04-14 VITALS — BP 120/62 | HR 69 | Temp 98.2°F | Resp 16

## 2016-04-14 DIAGNOSIS — C8589 Other specified types of non-Hodgkin lymphoma, extranodal and solid organ sites: Secondary | ICD-10-CM | POA: Insufficient documentation

## 2016-04-14 DIAGNOSIS — C8580 Other specified types of non-Hodgkin lymphoma, unspecified site: Secondary | ICD-10-CM

## 2016-04-14 DIAGNOSIS — D6481 Anemia due to antineoplastic chemotherapy: Secondary | ICD-10-CM | POA: Insufficient documentation

## 2016-04-14 DIAGNOSIS — C8330 Diffuse large B-cell lymphoma, unspecified site: Secondary | ICD-10-CM

## 2016-04-14 DIAGNOSIS — Z5111 Encounter for antineoplastic chemotherapy: Secondary | ICD-10-CM

## 2016-04-14 DIAGNOSIS — C8339 Diffuse large B-cell lymphoma, extranodal and solid organ sites: Secondary | ICD-10-CM | POA: Diagnosis not present

## 2016-04-14 DIAGNOSIS — Z5112 Encounter for antineoplastic immunotherapy: Secondary | ICD-10-CM

## 2016-04-14 DIAGNOSIS — T451X5A Adverse effect of antineoplastic and immunosuppressive drugs, initial encounter: Principal | ICD-10-CM

## 2016-04-14 LAB — CBC WITH DIFFERENTIAL (CANCER CENTER ONLY)
BASO#: 0 10*3/uL (ref 0.0–0.2)
BASO%: 0.3 % (ref 0.0–2.0)
EOS ABS: 0.1 10*3/uL (ref 0.0–0.5)
EOS%: 0.9 % (ref 0.0–7.0)
HEMATOCRIT: 25.5 % — AB (ref 38.7–49.9)
HGB: 8.7 g/dL — ABNORMAL LOW (ref 13.0–17.1)
LYMPH#: 0.4 10*3/uL — AB (ref 0.9–3.3)
LYMPH%: 6.2 % — ABNORMAL LOW (ref 14.0–48.0)
MCH: 28.5 pg (ref 28.0–33.4)
MCHC: 34.1 g/dL (ref 32.0–35.9)
MCV: 84 fL (ref 82–98)
MONO#: 0.2 10*3/uL (ref 0.1–0.9)
MONO%: 3 % (ref 0.0–13.0)
NEUT#: 5.9 10*3/uL (ref 1.5–6.5)
NEUT%: 89.6 % — ABNORMAL HIGH (ref 40.0–80.0)
Platelets: 204 10*3/uL (ref 145–400)
RBC: 3.05 10*6/uL — AB (ref 4.20–5.70)
RDW: 16.7 % — ABNORMAL HIGH (ref 11.1–15.7)
WBC: 6.6 10*3/uL (ref 4.0–10.0)

## 2016-04-14 LAB — CMP (CANCER CENTER ONLY)
ALBUMIN: 2.5 g/dL — AB (ref 3.3–5.5)
ALK PHOS: 113 U/L — AB (ref 26–84)
ALT: 16 U/L (ref 10–47)
AST: 21 U/L (ref 11–38)
BUN, Bld: 9 mg/dL (ref 7–22)
CALCIUM: 8.6 mg/dL (ref 8.0–10.3)
CO2: 23 mEq/L (ref 18–33)
Chloride: 107 mEq/L (ref 98–108)
Creat: 1 mg/dl (ref 0.6–1.2)
Glucose, Bld: 100 mg/dL (ref 73–118)
POTASSIUM: 3.1 meq/L — AB (ref 3.3–4.7)
Sodium: 134 mEq/L (ref 128–145)
TOTAL PROTEIN: 5.8 g/dL — AB (ref 6.4–8.1)
Total Bilirubin: 0.6 mg/dl (ref 0.20–1.60)

## 2016-04-14 LAB — LACTATE DEHYDROGENASE: LDH: 156 U/L (ref 125–245)

## 2016-04-14 MED ORDER — ACETAMINOPHEN 325 MG PO TABS
ORAL_TABLET | ORAL | Status: AC
Start: 2016-04-14 — End: 2016-04-14
  Filled 2016-04-14: qty 2

## 2016-04-14 MED ORDER — PALONOSETRON HCL INJECTION 0.25 MG/5ML
INTRAVENOUS | Status: AC
  Filled 2016-04-14: qty 5

## 2016-04-14 MED ORDER — SODIUM CHLORIDE 0.9 % IV SOLN
Freq: Once | INTRAVENOUS | Status: AC
  Administered 2016-04-14: 12:00:00 via INTRAVENOUS
  Filled 2016-04-14: qty 5

## 2016-04-14 MED ORDER — HEPARIN SOD (PORK) LOCK FLUSH 100 UNIT/ML IV SOLN
500.0000 [IU] | Freq: Once | INTRAVENOUS | Status: AC | PRN
  Administered 2016-04-14: 500 [IU]
  Filled 2016-04-14: qty 5

## 2016-04-14 MED ORDER — DIPHENHYDRAMINE HCL 25 MG PO CAPS
50.0000 mg | ORAL_CAPSULE | Freq: Once | ORAL | Status: AC
  Administered 2016-04-14: 50 mg via ORAL

## 2016-04-14 MED ORDER — SODIUM CHLORIDE 0.9% FLUSH
10.0000 mL | INTRAVENOUS | Status: DC | PRN
  Administered 2016-04-14: 10 mL
  Filled 2016-04-14: qty 10

## 2016-04-14 MED ORDER — RITUXIMAB CHEMO INJECTION 500 MG/50ML
375.0000 mg/m2 | Freq: Once | INTRAVENOUS | Status: AC
  Administered 2016-04-14: 700 mg via INTRAVENOUS
  Filled 2016-04-14: qty 50

## 2016-04-14 MED ORDER — SODIUM CHLORIDE 0.9 % IV SOLN
675.0000 mg/m2 | Freq: Once | INTRAVENOUS | Status: AC
  Administered 2016-04-14: 1260 mg via INTRAVENOUS
  Filled 2016-04-14: qty 63

## 2016-04-14 MED ORDER — SODIUM CHLORIDE 0.9 % IV SOLN
Freq: Once | INTRAVENOUS | Status: AC
  Administered 2016-04-14: 11:00:00 via INTRAVENOUS

## 2016-04-14 MED ORDER — PALONOSETRON HCL INJECTION 0.25 MG/5ML
0.2500 mg | Freq: Once | INTRAVENOUS | Status: AC
  Administered 2016-04-14: 0.25 mg via INTRAVENOUS

## 2016-04-14 MED ORDER — ACETAMINOPHEN 325 MG PO TABS
650.0000 mg | ORAL_TABLET | Freq: Once | ORAL | Status: AC
  Administered 2016-04-14: 650 mg via ORAL

## 2016-04-14 MED ORDER — DIPHENHYDRAMINE HCL 25 MG PO CAPS
ORAL_CAPSULE | ORAL | Status: AC
  Filled 2016-04-14: qty 2

## 2016-04-14 MED ORDER — DENOSUMAB 120 MG/1.7ML ~~LOC~~ SOLN
120.0000 mg | Freq: Once | SUBCUTANEOUS | Status: AC
  Administered 2016-04-14: 120 mg via SUBCUTANEOUS
  Filled 2016-04-14: qty 1.7

## 2016-04-14 MED ORDER — SODIUM CHLORIDE 0.9 % IV SOLN
45.0000 mg/m2 | Freq: Once | INTRAVENOUS | Status: AC
  Administered 2016-04-14: 80 mg via INTRAVENOUS
  Filled 2016-04-14: qty 4

## 2016-04-14 MED ORDER — VINCRISTINE SULFATE CHEMO INJECTION 1 MG/ML
2.0000 mg | Freq: Once | INTRAVENOUS | Status: AC
  Administered 2016-04-14: 2 mg via INTRAVENOUS
  Filled 2016-04-14: qty 2

## 2016-04-14 NOTE — Progress Notes (Signed)
Ok to treat with HGB 8.7 per Dr Marin Olp; patient scheduled to receive 2 units PRBC's on 9/15.

## 2016-04-14 NOTE — Patient Instructions (Signed)
[facility] Discharge Instructions for Patients Receiving Chemotherapy  Today you received the following chemotherapy agents Rituxan, Vincristine, Cytoxan and Etoposide and Xgeva.  To help prevent nausea and vomiting after your treatment, we encourage you to take your nausea medication.   If you develop nausea and vomiting that is not controlled by your nausea medication, call the clinic.   BELOW ARE SYMPTOMS THAT SHOULD BE REPORTED IMMEDIATELY:  *FEVER GREATER THAN 100.5 F  *CHILLS WITH OR WITHOUT FEVER  NAUSEA AND VOMITING THAT IS NOT CONTROLLED WITH YOUR NAUSEA MEDICATION  *UNUSUAL SHORTNESS OF BREATH  *UNUSUAL BRUISING OR BLEEDING  TENDERNESS IN MOUTH AND THROAT WITH OR WITHOUT PRESENCE OF ULCERS  *URINARY PROBLEMS  *BOWEL PROBLEMS  UNUSUAL RASH Items with * indicate a potential emergency and should be followed up as soon as possible.  Feel free to call the clinic you have any questions or concerns. The clinic phone number is [phone].  Please show the [place] at check-in to the Emergency Department and triage nurse.

## 2016-04-14 NOTE — Progress Notes (Signed)
Hematology and Oncology Follow Up Visit  Gilbert [name] [id number] 08-30-1950 65 y.o. 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  He does look a little pale. He's had no obvious bleeding. We checked his blood count today and found that his hemoglobin is 8.7. Because of this, I think we're going to have to transfuse him at some point.  He does have cardiac disease. This is why we are using etoposide and Adriamycin. I don't think we need to stress out his heart with anemia.  I did check a rectal exam on him. His stool is heme negative.  We did go ahead and repeat a PET scan on him. This was done on September 6. This showed continued improvement with his adenopathy. There is no evidence of any new or progressive disease.  As such, I think that he is still doing quite well with the chemotherapy.  He has not lost anymore weight. His weight actually is holding pretty steady.  I do have him on some [name]. Hopefully, this is making a difference.  He's had no problems with cough or shortness of breath.  He does get tired quite easily. He is not able to play as much cough. This is very important for him.   Overall, his performance status is ECOG 1.     Medications:    Medication List       Accurate as of 04/14/16 11:33 AM. Always use your most recent med list.          amLODipine 10 MG tablet Commonly known as:  NORVASC Take 10 mg by mouth daily.   aspirin EC 81 MG tablet Take 1 tablet (81 mg total) by mouth daily.   atorvastatin 80 MG tablet Commonly known as:  LIPITOR Take 80 mg by mouth daily.   B-12 5000 MCG Caps Take 5,000 mcg by mouth every morning.   carvedilol 12.5 MG  tablet Commonly known as:  COREG Take 12.5 mg by mouth 2 (two) times daily with a meal.   ciprofloxacin 500 MG tablet Commonly known as:  CIPRO Take 1 pill twice a day for 15 days only after each chemotherapy.   clopidogrel 75 MG tablet Commonly known as:  PLAVIX Take 75 mg by mouth daily.   cyclobenzaprine 10 MG tablet Commonly known as:  FLEXERIL Take 1 tablet (10 mg total) by mouth 3 (three) times daily as needed for muscle spasms.   dronabinol 5 MG capsule Commonly known as:  [name] Take 1 capsule (5 mg total) by mouth 2 (two) times daily before a meal.   lidocaine-prilocaine cream Commonly known as:  EMLA Apply 1 application topically daily as needed (port access). Apply to affected area once   LORazepam 0.5 MG tablet Commonly known as:  ATIVAN Take 1 tablet (0.5 mg total) by mouth every 8 (eight) hours.   megestrol 400 MG/10ML suspension Commonly known as:  MEGACE Take 20 mLs (800 mg total) by mouth daily.   METAMUCIL PO Take 3 capsules by mouth every evening. Reported on 10/24/2015   ondansetron 8 MG tablet Commonly known as:  ZOFRAN Take 1 tablet (8 mg total) by mouth 2 (two) times daily as needed for refractory  nausea / vomiting. Start on day 3 after cyclophosphamide chemotherapy.   pantoprazole 20 MG tablet Commonly known as:  PROTONIX Take 1 tablet (20 mg total) by mouth daily.   PHENYTEK 300 MG ER capsule Generic drug:  phenytoin Take 1 capsule (300 mg total) by mouth every morning.   polyethylene glycol packet Commonly known as:  MIRALAX / GLYCOLAX Take 17 g by mouth daily as needed for moderate constipation.   predniSONE 20 MG tablet Commonly known as:  DELTASONE Take 3 tablets (60 mg total) by mouth daily. Take on days 1-5 of chemotherapy.   prochlorperazine 10 MG tablet Commonly known as:  COMPAZINE Take 1 tablet (10 mg total) by mouth every 6 (six) hours as needed (Nausea or vomiting).   pyridOXINE 100 MG tablet Commonly known as:  VITAMIN  B-6 Take 100 mg by mouth every morning.   sucralfate 1 g tablet Commonly known as:  CARAFATE Take 1 tablet (1 g total) by mouth 4 (four) times daily.   valsartan 160 MG tablet Commonly known as:  DIOVAN Take 160 mg by mouth every morning. Reported on 11/05/2015   vitamin B-1 250 MG tablet Take 250 mg by mouth every morning.       Allergies:  Allergies  Allergen Reactions  . Oxycodone Swelling and Other (See Comments)    Tongue and lip    Past Medical History, Surgical history, Social history, and Family History were reviewed and updated.  Review of Systems: All other 10 point review of systems is negative.   Physical Exam:  weight is 147 lb 8 oz (66.9 kg). His oral temperature is 98.1 F (36.7 C). His blood pressure is 142/57 (abnormal) and his pulse is 55 (abnormal). His respiration is 16.   Wt Readings from Last 3 Encounters:  04/14/16 147 lb 8 oz (66.9 kg)  03/24/16 146 lb (66.2 kg)  03/03/16 155 lb (70.3 kg)    Well-developed and well-nourished white male in no obvious distress. Head and neck exam shows no ocular or oral lesions. No adenopathy noted in the neck. Lungs are clear. Neck exam regular in rhythm with no murmurs, rubs or bruits. Abdomen is soft. Has good bowel sounds. There is no fluid wave. There is no palpable liver or spleen tip. Axillar exam shows no bilateral axillary adenopathy. I did do a rectal exam on him. There is a small external hemorrhoid. There is no masses in the rectal vault. His stool was brown and heme negative Back exam shows no tenderness over the spine, ribs or hips. Externally shows no clubbing, cyanosis or edema. He has no tenderness over his long bones. He has good range of motion of his joints. Skin exam shows areas of vitiligo. Neurological exam shows no focal deficits.  Lab Results  Component Value Date   WBC 6.6 04/14/2016   HGB 8.7 (L) 04/14/2016   HCT 25.5 (L) 04/14/2016   MCV 84 04/14/2016   PLT 204 04/14/2016   No results  found for: FERRITIN, IRON, TIBC, UIBC, IRONPCTSAT Lab Results  Component Value Date   RBC 3.05 (L) 04/14/2016   No results found for: KPAFRELGTCHN, LAMBDASER, KAPLAMBRATIO No results found for: IGGSERUM, IGA, IGMSERUM No results found for: Ronnald Ramp, A1GS, A2GS, Tillman Sers, SPEI   Chemistry      Component Value Date/Time   NA 134 04/14/2016 0853   K 3.1 (L) 04/14/2016 0853   CL 107 04/14/2016 0853   CO2 23 04/14/2016 0853   BUN 9 04/14/2016  [id number]   CREATININE 1.0 04/14/2016 0853      Component Value Date/Time   CALCIUM 8.6 04/14/2016 0853   ALKPHOS 113 (H) 04/14/2016 0853   AST 21 04/14/2016 0853   ALT 16 04/14/2016 0853   BILITOT 0.60 04/14/2016 0853     Impression and Plan: Gilbert [name] is a pleasant 65 yo white male with history of clear cell carcinoma right kidney resected back in March 2015. He has now been diagnosed with diffuse large cell non-Hodgkin's lymphoma. Cytogenetics were negative for "double hit" lymphoma.  MUGA showed an EF of 42%. He is now being treated with R-CEOP.  We'll go ahead with his fifth cycle of treatment. I believe that he will need 8 cycles of treatment.   I will also given a blood transfusion. I thought he has daughter at length about blood transfusions. I explained why I thought a blood transfusion would help. I believe that given his cardiac history, that continued anemia would put more stress on his heart and can lead to heart failure issues. I would not want [place] to see that happen. I told him that a blood transfusion is safe. I told him that he probably would take about 4 hours to do. It will certainly help him.   We really cannot give him Aranesp because of his cardiac history.   We'll proceed with his fifth cycle chemotherapy today. We'll transfuse him in 2 days.   I will plan to see him back in 3 weeks.  Volanda Napoleon, MD.    9/13/201711:33 AM

## 2016-04-15 ENCOUNTER — Ambulatory Visit (HOSPITAL_BASED_OUTPATIENT_CLINIC_OR_DEPARTMENT_OTHER): Payer: Medicare Other

## 2016-04-15 VITALS — BP 169/51 | HR 51 | Temp 98.2°F | Resp 18

## 2016-04-15 DIAGNOSIS — D6481 Anemia due to antineoplastic chemotherapy: Secondary | ICD-10-CM | POA: Diagnosis not present

## 2016-04-15 DIAGNOSIS — C8589 Other specified types of non-Hodgkin lymphoma, extranodal and solid organ sites: Secondary | ICD-10-CM | POA: Diagnosis not present

## 2016-04-15 DIAGNOSIS — T451X5A Adverse effect of antineoplastic and immunosuppressive drugs, initial encounter: Secondary | ICD-10-CM

## 2016-04-15 DIAGNOSIS — Z5111 Encounter for antineoplastic chemotherapy: Secondary | ICD-10-CM

## 2016-04-15 DIAGNOSIS — C8580 Other specified types of non-Hodgkin lymphoma, unspecified site: Secondary | ICD-10-CM

## 2016-04-15 DIAGNOSIS — C8339 Diffuse large B-cell lymphoma, extranodal and solid organ sites: Secondary | ICD-10-CM

## 2016-04-15 DIAGNOSIS — C8330 Diffuse large B-cell lymphoma, unspecified site: Secondary | ICD-10-CM

## 2016-04-15 MED ORDER — FUROSEMIDE 10 MG/ML IJ SOLN
INTRAMUSCULAR | Status: AC
  Filled 2016-04-15: qty 4

## 2016-04-15 MED ORDER — SODIUM CHLORIDE 0.9 % IV SOLN
Freq: Once | INTRAVENOUS | Status: AC
  Administered 2016-04-15: 09:00:00 via INTRAVENOUS

## 2016-04-15 MED ORDER — DIPHENHYDRAMINE HCL 25 MG PO TABS
25.0000 mg | ORAL_TABLET | Freq: Once | ORAL | Status: AC
  Administered 2016-04-15: 25 mg via ORAL
  Filled 2016-04-15: qty 1

## 2016-04-15 MED ORDER — FUROSEMIDE 10 MG/ML IJ SOLN
20.0000 mg | Freq: Once | INTRAMUSCULAR | Status: AC
  Administered 2016-04-15: 20 mg via INTRAVENOUS

## 2016-04-15 MED ORDER — DIPHENHYDRAMINE HCL 25 MG PO CAPS
ORAL_CAPSULE | ORAL | Status: AC
  Filled 2016-04-15: qty 1

## 2016-04-15 MED ORDER — ACETAMINOPHEN 325 MG PO TABS
ORAL_TABLET | ORAL | Status: AC
  Filled 2016-04-15: qty 2

## 2016-04-15 MED ORDER — SODIUM CHLORIDE 0.9 % IV SOLN
45.0000 mg/m2 | Freq: Once | INTRAVENOUS | Status: AC
  Administered 2016-04-15: 80 mg via INTRAVENOUS
  Filled 2016-04-15: qty 4

## 2016-04-15 MED ORDER — SODIUM CHLORIDE 0.9 % IV SOLN
250.0000 mL | Freq: Once | INTRAVENOUS | Status: AC
  Administered 2016-04-15: 250 mL via INTRAVENOUS

## 2016-04-15 MED ORDER — ACETAMINOPHEN 325 MG PO TABS
650.0000 mg | ORAL_TABLET | Freq: Four times a day (QID) | ORAL | Status: DC | PRN
  Administered 2016-04-15: 650 mg via ORAL

## 2016-04-15 MED ORDER — HEPARIN SOD (PORK) LOCK FLUSH 100 UNIT/ML IV SOLN
500.0000 [IU] | Freq: Once | INTRAVENOUS | Status: AC | PRN
  Administered 2016-04-15: 500 [IU]
  Filled 2016-04-15: qty 5

## 2016-04-15 MED ORDER — PROCHLORPERAZINE MALEATE 10 MG PO TABS
ORAL_TABLET | ORAL | Status: AC
  Filled 2016-04-15: qty 1

## 2016-04-15 MED ORDER — SODIUM CHLORIDE 0.9% FLUSH
10.0000 mL | INTRAVENOUS | Status: DC | PRN
  Administered 2016-04-15: 10 mL
  Filled 2016-04-15: qty 10

## 2016-04-15 MED ORDER — PROCHLORPERAZINE MALEATE 10 MG PO TABS
10.0000 mg | ORAL_TABLET | Freq: Once | ORAL | Status: AC
  Administered 2016-04-15: 10 mg via ORAL

## 2016-04-15 MED FILL — CIPROFLOXACIN HCL 500 MG TA: 500 | 15 days supply | Qty: 30 | Fill #1

## 2016-04-15 NOTE — Patient Instructions (Signed)
[facility] Discharge Instructions for Patients Receiving Chemotherapy  Today you received the following chemotherapy agents Etoposide.  To help prevent nausea and vomiting after your treatment, we encourage you to take your nausea medication.  If you develop nausea and vomiting that is not controlled by your nausea medication, call the clinic.   BELOW ARE SYMPTOMS THAT SHOULD BE REPORTED IMMEDIATELY:  *FEVER GREATER THAN 100.5 F  *CHILLS WITH OR WITHOUT FEVER  NAUSEA AND VOMITING THAT IS NOT CONTROLLED WITH YOUR NAUSEA MEDICATION  *UNUSUAL SHORTNESS OF BREATH  *UNUSUAL BRUISING OR BLEEDING  TENDERNESS IN MOUTH AND THROAT WITH OR WITHOUT PRESENCE OF ULCERS  *URINARY PROBLEMS  *BOWEL PROBLEMS  UNUSUAL RASH Items with * indicate a potential emergency and should be followed up as soon as possible.  Feel free to call the clinic you have any questions or concerns. The clinic phone number is [phone].  Please show the Mona at check-in to the Emergency Department and triage nurse.    Blood Transfusion  A blood transfusion is a procedure in which you receive donated blood through an IV tube. You Garton need a blood transfusion because of illness, surgery, or injury. The blood Tabak come from a donor, or it Hoppel be your own blood that you donated previously. The blood given in a transfusion is made up of different types of cells. You Pospisil receive:  Red blood cells. These carry oxygen and replace lost blood.  Platelets. These control bleeding.  Plasma. Thishelps blood to clot. If you have hemophilia or another clotting disorder, you Gadomski also receive other types of blood products. LET [facility] CARE PROVIDER KNOW ABOUT:  Any allergies you have.  All medicines you are taking, including vitamins, herbs, eye drops, creams, and over-the-counter medicines.  Previous problems you or members of your family have had with the use of anesthetics.  Any  blood disorders you have.  Previous surgeries you have had.  Any medical conditions you Barriere have.  Any previous reactions you have had during a blood transfusion.  RISKS AND COMPLICATIONS Generally, this is a safe procedure. However, problems Malkiewicz occur, including:  Having an allergic reaction to something in the donated blood.  Fever. This Manzer be a reaction to the white blood cells in the transfused blood.  Iron overload. This can happen from having many transfusions.  Transfusion-related acute lung injury (TRALI). This is a rare reaction that causes lung damage. The cause is not known.TRALI can occur within hours of a transfusion or several days later.  Sudden (acute) or delayed hemolytic reactions. This happens if your blood does not match the cells in your transfusion. Your body's defense system (immune system) Risden try to attack the new cells. This complication is rare.  Infection. This is rare. BEFORE THE PROCEDURE  You Hulbert have a blood test to determine your blood type. This is necessary to know what kind of blood your body will accept.  If you are going to have a planned surgery, you Boyers donate your own blood. This Etherington be done in case you need to have a transfusion.  If you have had an allergic reaction to a transfusion in the past, you Vanyo be given medicine to help prevent a reaction. Take this medicine only as directed by your health care provider.  You will have your temperature, blood pressure, and pulse monitored before the transfusion. PROCEDURE   An IV will be started in your hand or arm.  The bag  of donated blood will be attached to your IV tube and given into your vein.  Your temperature, blood pressure, and pulse will be monitored regularly during the transfusion. This monitoring is done to detect early signs of a transfusion reaction.  If you have any signs or symptoms of a reaction, your transfusion will be stopped and you Corsi be given medicine.  When the  transfusion is over, your IV will be removed.  Pressure Acocella be applied to the IV site for a few minutes.  A bandage (dressing) will be applied. The procedure Betty vary among health care providers and hospitals. AFTER THE PROCEDURE  Your blood pressure, temperature, and pulse will be monitored regularly.   This information is not intended to replace advice given to you by your health care provider. Make sure you discuss any questions you have with your health care provider.   Document Released: 07/16/2000 Document Revised: 08/09/2014 Document Reviewed: 05/29/2014 Elsevier Interactive Patient Education [employer].

## 2016-04-16 ENCOUNTER — Encounter: Payer: Self-pay | Admitting: Hematology & Oncology

## 2016-04-16 ENCOUNTER — Ambulatory Visit (HOSPITAL_BASED_OUTPATIENT_CLINIC_OR_DEPARTMENT_OTHER): Payer: Medicare Other

## 2016-04-16 VITALS — BP 157/67 | HR 57 | Temp 98.4°F | Resp 16

## 2016-04-16 DIAGNOSIS — C8330 Diffuse large B-cell lymphoma, unspecified site: Secondary | ICD-10-CM

## 2016-04-16 DIAGNOSIS — Z5111 Encounter for antineoplastic chemotherapy: Secondary | ICD-10-CM

## 2016-04-16 DIAGNOSIS — C8580 Other specified types of non-Hodgkin lymphoma, unspecified site: Secondary | ICD-10-CM

## 2016-04-16 DIAGNOSIS — C8339 Diffuse large B-cell lymphoma, extranodal and solid organ sites: Secondary | ICD-10-CM

## 2016-04-16 DIAGNOSIS — C8589 Other specified types of non-Hodgkin lymphoma, extranodal and solid organ sites: Secondary | ICD-10-CM

## 2016-04-16 LAB — TYPE AND SCREEN
ABO/RH(D): A POS
ANTIBODY SCREEN: NEGATIVE
UNIT DIVISION: 0
UNIT DIVISION: 0

## 2016-04-16 LAB — PREPARE RBC (CROSSMATCH)

## 2016-04-16 MED ORDER — SODIUM CHLORIDE 0.9% FLUSH
10.0000 mL | INTRAVENOUS | Status: DC | PRN
  Administered 2016-04-16: 10 mL
  Filled 2016-04-16: qty 10

## 2016-04-16 MED ORDER — HEPARIN SOD (PORK) LOCK FLUSH 100 UNIT/ML IV SOLN
500.0000 [IU] | Freq: Once | INTRAVENOUS | Status: AC | PRN
  Administered 2016-04-16: 500 [IU]
  Filled 2016-04-16: qty 5

## 2016-04-16 MED ORDER — SODIUM CHLORIDE 0.9 % IV SOLN
45.0000 mg/m2 | Freq: Once | INTRAVENOUS | Status: AC
  Administered 2016-04-16: 80 mg via INTRAVENOUS
  Filled 2016-04-16: qty 4

## 2016-04-16 MED ORDER — PROCHLORPERAZINE MALEATE 10 MG PO TABS
ORAL_TABLET | ORAL | Status: AC
  Filled 2016-04-16: qty 1

## 2016-04-16 MED ORDER — SODIUM CHLORIDE 0.9 % IV SOLN
Freq: Once | INTRAVENOUS | Status: AC
  Administered 2016-04-16: 09:00:00 via INTRAVENOUS

## 2016-04-16 MED ORDER — PROCHLORPERAZINE MALEATE 10 MG PO TABS
10.0000 mg | ORAL_TABLET | Freq: Once | ORAL | Status: AC
  Administered 2016-04-16: 10 mg via ORAL

## 2016-04-16 NOTE — Patient Instructions (Signed)
Etoposide, VP-16 injection  What is this medicine?  ETOPOSIDE, VP-16 (e toe POE side) is a chemotherapy drug. It is used to treat testicular cancer, lung cancer, and other cancers.  This medicine Lamagna be used for other purposes; ask your health care provider or pharmacist if you have questions.  What should I tell my health care provider before I take this medicine?  They need to know if you have any of these conditions:  -infection  -kidney disease  -low blood counts, like low white cell, platelet, or red cell counts  -an unusual or allergic reaction to etoposide, other chemotherapeutic agents, other medicines, foods, dyes, or preservatives  -pregnant or trying to get pregnant  -breast-feeding  How should I use this medicine?  This medicine is for infusion into a vein. It is administered in a hospital or clinic by a specially trained health care professional.  Talk to your pediatrician regarding the use of this medicine in children. Special care Sada be needed.  Overdosage: If you think you have taken too much of this medicine contact a poison control center or emergency room at once.  NOTE: This medicine is only for you. Do not share this medicine with others.  What if I miss a dose?  It is important not to miss your dose. Call your doctor or health care professional if you are unable to keep an appointment.  What Ribera interact with this medicine?  -aspirin  -certain medications for seizures like carbamazepine, phenobarbital, phenytoin, valproic acid  -cyclosporine  -levamisole  -warfarin  This list Valvano not describe all possible interactions. Give your health care provider a list of all the medicines, herbs, non-prescription drugs, or dietary supplements you use. Also tell them if you smoke, drink alcohol, or use illegal drugs. Some items Grumbine interact with your medicine.  What should I watch for while using this medicine?  Visit your doctor for checks on your progress. This drug Stallman make you feel generally unwell.  This is not uncommon, as chemotherapy can affect healthy cells as well as cancer cells. Report any side effects. Continue your course of treatment even though you feel ill unless your doctor tells you to stop.  In some cases, you Havey be given additional medicines to help with side effects. Follow all directions for their use.  Call your doctor or health care professional for advice if you get a fever, chills or sore throat, or other symptoms of a cold or flu. Do not treat yourself. This drug decreases your body's ability to fight infections. Try to avoid being around people who are sick.  This medicine Bazzle increase your risk to bruise or bleed. Call your doctor or health care professional if you notice any unusual bleeding.  Be careful brushing and flossing your teeth or using a toothpick because you Marlar get an infection or bleed more easily. If you have any dental work done, tell your dentist you are receiving this medicine.  Avoid taking products that contain aspirin, acetaminophen, ibuprofen, naproxen, or ketoprofen unless instructed by your doctor. These medicines Penson hide a fever.  Do not become pregnant while taking this medicine or for at least 6 months after stopping it. Women should inform their doctor if they wish to become pregnant or think they might be pregnant. Women of child-bearing potential will need to have a negative pregnancy test before starting this medicine. There is a potential for serious side effects to an unborn child. Talk to your health care professional or   pharmacist for more information. Do not breast-feed an infant while taking this medicine.  Men must use a latex condom during sexual contact with a woman while taking this medicine and for at least 4 months after stopping it. A latex condom is needed even if you have had a vasectomy. Contact your doctor right away if your partner becomes pregnant. Do not donate sperm while taking this medicine and for at least 4 months after you stop  taking this medicine. Men should inform their doctors if they wish to father a child. This medicine Prisco lower sperm counts.  What side effects Solano I notice from receiving this medicine?  Side effects that you should report to your doctor or health care professional as soon as possible:  -allergic reactions like skin rash, itching or hives, swelling of the face, lips, or tongue  -low blood counts - this medicine Tiley decrease the number of white blood cells, red blood cells and platelets. You Shepardson be at increased risk for infections and bleeding.  -signs of infection - fever or chills, cough, sore throat, pain or difficulty passing urine  -signs of decreased platelets or bleeding - bruising, pinpoint red spots on the skin, black, tarry stools, blood in the urine  -signs of decreased red blood cells - unusually weak or tired, fainting spells, lightheadedness  -breathing problems  -changes in vision  -mouth or throat sores or ulcers  -pain, redness, swelling or irritation at the injection site  -pain, tingling, numbness in the hands or feet  -redness, blistering, peeling or loosening of the skin, including inside the mouth  -seizures  -vomiting  Side effects that usually do not require medical attention (report to your doctor or health care professional if they continue or are bothersome):  -diarrhea  -hair loss  -loss of appetite  -nausea  -stomach pain  This list Elsasser not describe all possible side effects. Call your doctor for medical advice about side effects. You Intrieri report side effects to FDA at 1-800-FDA-1088.  Where should I keep my medicine?  This drug is given in a hospital or clinic and will not be stored at home.  NOTE: This sheet is a summary. It Torgeson not cover all possible information. If you have questions about this medicine, talk to your doctor, pharmacist, or health care provider.      2016, Elsevier/Gold Standard. (2014-03-14 12:32:50)

## 2016-05-05 ENCOUNTER — Other Ambulatory Visit (HOSPITAL_BASED_OUTPATIENT_CLINIC_OR_DEPARTMENT_OTHER): Payer: Medicare Other

## 2016-05-05 ENCOUNTER — Encounter: Payer: Self-pay | Admitting: Hematology & Oncology

## 2016-05-05 ENCOUNTER — Ambulatory Visit (HOSPITAL_BASED_OUTPATIENT_CLINIC_OR_DEPARTMENT_OTHER): Payer: Medicare Other | Admitting: Hematology & Oncology

## 2016-05-05 ENCOUNTER — Ambulatory Visit (HOSPITAL_BASED_OUTPATIENT_CLINIC_OR_DEPARTMENT_OTHER)
Admission: RE | Admit: 2016-05-05 | Discharge: 2016-05-05 | Disposition: A | Discharge status: Home / Self Care | Payer: Medicare Other | Source: Ambulatory Visit | Attending: Hematology & Oncology | Admitting: Hematology & Oncology

## 2016-05-05 ENCOUNTER — Ambulatory Visit (HOSPITAL_BASED_OUTPATIENT_CLINIC_OR_DEPARTMENT_OTHER): Payer: Medicare Other

## 2016-05-05 VITALS — BP 155/69 | HR 66 | Temp 98.1°F | Resp 16 | Ht 70.0 in | Wt 144.0 lb

## 2016-05-05 DIAGNOSIS — C8330 Diffuse large B-cell lymphoma, unspecified site: Secondary | ICD-10-CM

## 2016-05-05 DIAGNOSIS — C8339 Diffuse large B-cell lymphoma, extranodal and solid organ sites: Secondary | ICD-10-CM

## 2016-05-05 DIAGNOSIS — C8589 Other specified types of non-Hodgkin lymphoma, extranodal and solid organ sites: Secondary | ICD-10-CM

## 2016-05-05 DIAGNOSIS — C8580 Other specified types of non-Hodgkin lymphoma, unspecified site: Principal | ICD-10-CM

## 2016-05-05 DIAGNOSIS — D6481 Anemia due to antineoplastic chemotherapy: Secondary | ICD-10-CM

## 2016-05-05 DIAGNOSIS — M7989 Other specified soft tissue disorders: Secondary | ICD-10-CM | POA: Insufficient documentation

## 2016-05-05 DIAGNOSIS — R202 Paresthesia of skin: Secondary | ICD-10-CM | POA: Diagnosis not present

## 2016-05-05 DIAGNOSIS — T451X5A Adverse effect of antineoplastic and immunosuppressive drugs, initial encounter: Secondary | ICD-10-CM | POA: Diagnosis not present

## 2016-05-05 LAB — CBC WITH DIFFERENTIAL (CANCER CENTER ONLY)
BASO#: 0 10*3/uL (ref 0.0–0.2)
BASO%: 0.5 % (ref 0.0–2.0)
EOS ABS: 0.1 10*3/uL (ref 0.0–0.5)
EOS%: 1.6 % (ref 0.0–7.0)
HCT: 28.9 % — ABNORMAL LOW (ref 38.7–49.9)
HEMOGLOBIN: 9.8 g/dL — AB (ref 13.0–17.1)
LYMPH#: 0.6 10*3/uL — ABNORMAL LOW (ref 0.9–3.3)
LYMPH%: 9 % — AB (ref 14.0–48.0)
MCH: 28.8 pg (ref 28.0–33.4)
MCHC: 33.9 g/dL (ref 32.0–35.9)
MCV: 85 fL (ref 82–98)
MONO#: 0.5 10*3/uL (ref 0.1–0.9)
MONO%: 8.6 % (ref 0.0–13.0)
NEUT%: 80.3 % — ABNORMAL HIGH (ref 40.0–80.0)
NEUTROS ABS: 4.9 10*3/uL (ref 1.5–6.5)
PLATELETS: 180 10*3/uL (ref 145–400)
RBC: 3.4 10*6/uL — AB (ref 4.20–5.70)
RDW: 15.8 % — ABNORMAL HIGH (ref 11.1–15.7)
WBC: 6.1 10*3/uL (ref 4.0–10.0)

## 2016-05-05 LAB — IRON AND TIBC
%SAT: 44 % (ref 20–55)
Iron: 67 ug/dL (ref 42–163)
TIBC: 153 ug/dL — ABNORMAL LOW (ref 202–409)
UIBC: 86 ug/dL — AB (ref 117–376)

## 2016-05-05 LAB — LACTATE DEHYDROGENASE: LDH: 220 U/L (ref 125–245)

## 2016-05-05 LAB — CMP (CANCER CENTER ONLY)
ALBUMIN: 2.5 g/dL — AB (ref 3.3–5.5)
ALK PHOS: 114 U/L — AB (ref 26–84)
ALT: 20 U/L (ref 10–47)
AST: 22 U/L (ref 11–38)
BUN: 10 mg/dL (ref 7–22)
CHLORIDE: 104 meq/L (ref 98–108)
CO2: 27 mEq/L (ref 18–33)
CREATININE: 1 mg/dL (ref 0.6–1.2)
Calcium: 8.3 mg/dL (ref 8.0–10.3)
Glucose, Bld: 84 mg/dL (ref 73–118)
Potassium: 3.3 mEq/L (ref 3.3–4.7)
SODIUM: 137 meq/L (ref 128–145)
TOTAL PROTEIN: 5.3 g/dL — AB (ref 6.4–8.1)
Total Bilirubin: 0.4 mg/dl (ref 0.20–1.60)

## 2016-05-05 LAB — FERRITIN: FERRITIN: 295 ng/mL (ref 22–316)

## 2016-05-05 MED ORDER — SODIUM CHLORIDE 0.9% FLUSH
10.0000 mL | INTRAVENOUS | Status: DC | PRN
  Administered 2016-05-05: 10 mL via INTRAVENOUS
  Filled 2016-05-05: qty 10

## 2016-05-05 MED ORDER — HEPARIN SOD (PORK) LOCK FLUSH 100 UNIT/ML IV SOLN
500.0000 [IU] | Freq: Once | INTRAVENOUS | Status: AC
  Administered 2016-05-05: 500 [IU] via INTRAVENOUS
  Filled 2016-05-05: qty 5

## 2016-05-05 NOTE — Patient Instructions (Signed)

## 2016-05-05 NOTE — Progress Notes (Signed)
Hematology and Oncology Follow Up Visit  Gilbert [name] [id number] 1951/03/20 65 y.o. 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  He looks better. We did have to give him 2 units of blood with his last cycle of treatment. His hemoglobin had gone down. I suspect this probably is from the chemotherapy. We really cannot give him Aranesp.  The blood [name] him feel better. We checked his iron studies today. His ferritin was 295 with an iron saturation of 44%.  He still is losing a little bit of weight. I really hate this. I have monitored [name]. I told him to try to double the [name] to see this [name] help him gain weight.   He really wants to hold off on treatment for right now until he gets back from vacation. I told him that this would be reasonable. He will be gone for a week. He just wants to feel better when he goes on vacation.   He has been complaining of some swelling in the right forearm. He has had some tingling in his hands. This might be from the chemotherapy. We did do a Doppler of his right arm. This was negative for any thromboembolic disease.   He has not had any fever. He's had no mouth sores. He says that on occasion, he does have a hard time swallowing.   He's had no diarrhea. He's been urinating okay.   There's been no leg swelling. He's had no rashes.    Medications:    Medication List       Accurate as of 05/05/16  5:44 PM. Always use your most recent med list.          amLODipine 10 MG tablet Commonly known as:  NORVASC Take 10 mg by mouth daily.   aspirin EC 81 MG tablet Take 1 tablet (81 mg total) by mouth daily.   atorvastatin 80 MG tablet Commonly known as:   LIPITOR Take 80 mg by mouth daily.   B-12 5000 MCG Caps Take 5,000 mcg by mouth every morning.   carvedilol 12.5 MG tablet Commonly known as:  COREG Take 12.5 mg by mouth 2 (two) times daily with a meal.   ciprofloxacin 500 MG tablet Commonly known as:  CIPRO Take 1 pill twice a day for 15 days only after each chemotherapy.   clopidogrel 75 MG tablet Commonly known as:  PLAVIX Take 75 mg by mouth daily.   cyclobenzaprine 10 MG tablet Commonly known as:  FLEXERIL Take 1 tablet (10 mg total) by mouth 3 (three) times daily as needed for muscle spasms.   dronabinol 5 MG capsule Commonly known as:  [name] Take 1 capsule (5 mg total) by mouth 2 (two) times daily before a meal.   lidocaine-prilocaine cream Commonly known as:  EMLA Apply 1 application topically daily as needed (port access). Apply to affected area once   LORazepam 0.5 MG tablet Commonly known as:  ATIVAN Take 1 tablet (0.5 mg total) by mouth every 8 (eight) hours.   megestrol 400 MG/10ML suspension Commonly known as:  MEGACE Take 20 mLs (800 mg total) by mouth daily.   METAMUCIL PO Take 3 capsules by mouth every evening.  Reported on 10/24/2015   ondansetron 8 MG tablet Commonly known as:  ZOFRAN Take 1 tablet (8 mg total) by mouth 2 (two) times daily as needed for refractory nausea / vomiting. Start on day 3 after cyclophosphamide chemotherapy.   pantoprazole 20 MG tablet Commonly known as:  PROTONIX Take 1 tablet (20 mg total) by mouth daily.   PHENYTEK 300 MG ER capsule Generic drug:  phenytoin Take 1 capsule (300 mg total) by mouth every morning.   polyethylene glycol packet Commonly known as:  MIRALAX / GLYCOLAX Take 17 g by mouth daily as needed for moderate constipation.   predniSONE 20 MG tablet Commonly known as:  DELTASONE Take 3 tablets (60 mg total) by mouth daily. Take on days 1-5 of chemotherapy.   prochlorperazine 10 MG tablet Commonly known as:  COMPAZINE Take 1 tablet (10 mg  total) by mouth every 6 (six) hours as needed (Nausea or vomiting).   pyridOXINE 100 MG tablet Commonly known as:  VITAMIN B-6 Take 100 mg by mouth every morning.   sucralfate 1 g tablet Commonly known as:  CARAFATE Take 1 tablet (1 g total) by mouth 4 (four) times daily.   valsartan 160 MG tablet Commonly known as:  DIOVAN Take 160 mg by mouth every morning. Reported on 11/05/2015   vitamin B-1 250 MG tablet Take 250 mg by mouth every morning.       Allergies:  Allergies  Allergen Reactions  . Oxycodone Swelling and Other (See Comments)    Tongue and lip    Past Medical History, Surgical history, Social history, and Family History were reviewed and updated.  Review of Systems: All other 10 point review of systems is negative.   Physical Exam:  height is 5\' 10"  (1.778 m) and weight is 144 lb (65.3 kg). His oral temperature is 98.1 F (36.7 C). His blood pressure is 155/69 (abnormal) and his pulse is 66. His respiration is 16.   Wt Readings from Last 3 Encounters:  05/05/16 144 lb (65.3 kg)  04/14/16 147 lb 8 oz (66.9 kg)  03/24/16 146 lb (66.2 kg)    Well-developed and well-nourished white male in no obvious distress. Head and neck exam shows no ocular or oral lesions. No adenopathy noted in the neck. Lungs are clear. Neck exam regular in rhythm with no murmurs, rubs or bruits. Abdomen is soft. Has good bowel sounds. There is no fluid wave. There is no palpable liver or spleen tip. Axillar exam shows no bilateral axillary adenopathy. I did do a rectal exam on him. There is a small external hemorrhoid. There is no masses in the rectal vault. His stool was brown and heme negative Back exam shows no tenderness over the spine, ribs or hips. Extremities shows no clubbing, cyanosis or edema. There Andes be some slight swelling in the right forearm. He has no tenderness over his long bones. He has good range of motion of his joints. Skin exam shows areas of vitiligo. Neurological exam  shows no focal deficits.  Lab Results  Component Value Date   WBC 6.1 05/05/2016   HGB 9.8 (L) 05/05/2016   HCT 28.9 (L) 05/05/2016   MCV 85 05/05/2016   PLT 180 05/05/2016   Lab Results  Component Value Date   FERRITIN 295 05/05/2016   IRON 67 05/05/2016   TIBC 153 (L) 05/05/2016   UIBC 86 (L) 05/05/2016   IRONPCTSAT 44 05/05/2016   Lab Results  Component Value Date   RBC 3.40 (L) 05/05/2016  No results found for: KPAFRELGTCHN, LAMBDASER, KAPLAMBRATIO No results found for: IGGSERUM, IGA, IGMSERUM No results found for: Odetta Pink, SPEI   Chemistry      Component Value Date/Time   NA 137 05/05/2016 0818   K 3.3 05/05/2016 0818   CL 104 05/05/2016 0818   CO2 27 05/05/2016 0818   BUN 10 05/05/2016 0818   CREATININE 1.0 05/05/2016 0818      Component Value Date/Time   CALCIUM 8.3 05/05/2016 0818   ALKPHOS 114 (H) 05/05/2016 0818   AST 22 05/05/2016 0818   ALT 20 05/05/2016 0818   BILITOT 0.40 05/05/2016 0818     Impression and Plan: Gilbert [name] is a pleasant 65 yo white male with history of clear cell carcinoma right kidney resected back in March 2015. He has now been diagnosed with diffuse large cell non-Hodgkin's lymphoma. Cytogenetics were negative for "double hit" lymphoma.  MUGA showed an EF of 42%. He is now being treated with R-CEOP.  I showed he and his daughter the PET scans that he has had done. I showed them the response that he has had. From my point of view, I think the response to chemotherapy has been very nice.  He really wants to go to the beach for a week. He is leaving on Sunday. He will will be gone for a week. He really does not want any treatment until he gets back. I really think this is okay. Again he has responded very nicely. I am sure that the chemotherapy is still in his system.  For now, we will start his 6th cycle of treatment when he returns. I just do not think that it will be a problem  for her him to be off treatment for a week or so. This will last him to have a very good time on vacation. Hopefully he will gain some weight. I told him to double up on the [name].  I will plan to see him back when he returns from his vacation and we will see how he is doing then.Volanda Napoleon, MD    10/4/20175:44 PM

## 2016-05-06 ENCOUNTER — Ambulatory Visit: Payer: BLUE CROSS/BLUE SHIELD | Admitting: Family Medicine

## 2016-05-06 ENCOUNTER — Ambulatory Visit: Payer: Medicare Other

## 2016-05-06 LAB — RETICULOCYTES: Reticulocyte Count: 0.9 % (ref 0.6–2.6)

## 2016-05-07 ENCOUNTER — Ambulatory Visit: Payer: Medicare Other

## 2016-05-19 ENCOUNTER — Other Ambulatory Visit (HOSPITAL_BASED_OUTPATIENT_CLINIC_OR_DEPARTMENT_OTHER): Payer: Medicare Other

## 2016-05-19 ENCOUNTER — Ambulatory Visit (HOSPITAL_BASED_OUTPATIENT_CLINIC_OR_DEPARTMENT_OTHER): Payer: Medicare Other | Admitting: Family

## 2016-05-19 ENCOUNTER — Ambulatory Visit (HOSPITAL_BASED_OUTPATIENT_CLINIC_OR_DEPARTMENT_OTHER): Payer: Medicare Other

## 2016-05-19 VITALS — BP 141/48 | HR 60 | Temp 97.8°F | Resp 20

## 2016-05-19 DIAGNOSIS — Z5111 Encounter for antineoplastic chemotherapy: Secondary | ICD-10-CM | POA: Diagnosis not present

## 2016-05-19 DIAGNOSIS — C8589 Other specified types of non-Hodgkin lymphoma, extranodal and solid organ sites: Secondary | ICD-10-CM

## 2016-05-19 DIAGNOSIS — C8580 Other specified types of non-Hodgkin lymphoma, unspecified site: Principal | ICD-10-CM

## 2016-05-19 DIAGNOSIS — M7989 Other specified soft tissue disorders: Secondary | ICD-10-CM

## 2016-05-19 DIAGNOSIS — C8330 Diffuse large B-cell lymphoma, unspecified site: Secondary | ICD-10-CM

## 2016-05-19 DIAGNOSIS — C8339 Diffuse large B-cell lymphoma, extranodal and solid organ sites: Secondary | ICD-10-CM | POA: Diagnosis not present

## 2016-05-19 DIAGNOSIS — D6481 Anemia due to antineoplastic chemotherapy: Secondary | ICD-10-CM | POA: Diagnosis not present

## 2016-05-19 DIAGNOSIS — Z5112 Encounter for antineoplastic immunotherapy: Secondary | ICD-10-CM

## 2016-05-19 LAB — CMP (CANCER CENTER ONLY)
ALT(SGPT): 21 U/L (ref 10–47)
AST: 24 U/L (ref 11–38)
Albumin: 2.7 g/dL — ABNORMAL LOW (ref 3.3–5.5)
Alkaline Phosphatase: 107 U/L — ABNORMAL HIGH (ref 26–84)
BUN: 11 mg/dL (ref 7–22)
CHLORIDE: 100 meq/L (ref 98–108)
CO2: 29 meq/L (ref 18–33)
CREATININE: 0.8 mg/dL (ref 0.6–1.2)
Calcium: 8.6 mg/dL (ref 8.0–10.3)
Glucose, Bld: 84 mg/dL (ref 73–118)
POTASSIUM: 3.1 meq/L — AB (ref 3.3–4.7)
SODIUM: 134 meq/L (ref 128–145)
TOTAL PROTEIN: 5.3 g/dL — AB (ref 6.4–8.1)
Total Bilirubin: 0.5 mg/dl (ref 0.20–1.60)

## 2016-05-19 LAB — CBC WITH DIFFERENTIAL (CANCER CENTER ONLY)
BASO#: 0 10*3/uL (ref 0.0–0.2)
BASO%: 0.4 % (ref 0.0–2.0)
EOS%: 9.8 % — ABNORMAL HIGH (ref 0.0–7.0)
Eosinophils Absolute: 0.5 10*3/uL (ref 0.0–0.5)
HEMATOCRIT: 27.4 % — AB (ref 38.7–49.9)
HGB: 9.3 g/dL — ABNORMAL LOW (ref 13.0–17.1)
LYMPH#: 0.5 10*3/uL — AB (ref 0.9–3.3)
LYMPH%: 11.5 % — ABNORMAL LOW (ref 14.0–48.0)
MCH: 29.9 pg (ref 28.0–33.4)
MCHC: 33.9 g/dL (ref 32.0–35.9)
MCV: 88 fL (ref 82–98)
MONO#: 0.6 10*3/uL (ref 0.1–0.9)
MONO%: 11.8 % (ref 0.0–13.0)
NEUT%: 66.5 % (ref 40.0–80.0)
NEUTROS ABS: 3.1 10*3/uL (ref 1.5–6.5)
PLATELETS: 121 10*3/uL — AB (ref 145–400)
RBC: 3.11 10*6/uL — AB (ref 4.20–5.70)
RDW: 17.7 % — ABNORMAL HIGH (ref 11.1–15.7)
WBC: 4.7 10*3/uL (ref 4.0–10.0)

## 2016-05-19 LAB — LACTATE DEHYDROGENASE: LDH: 168 U/L (ref 125–245)

## 2016-05-19 MED ORDER — VINCRISTINE SULFATE CHEMO INJECTION 1 MG/ML
2.0000 mg | Freq: Once | INTRAVENOUS | Status: AC
  Administered 2016-05-19: 2 mg via INTRAVENOUS
  Filled 2016-05-19: qty 2

## 2016-05-19 MED ORDER — PALONOSETRON HCL INJECTION 0.25 MG/5ML
0.2500 mg | Freq: Once | INTRAVENOUS | Status: AC
  Administered 2016-05-19: 0.25 mg via INTRAVENOUS

## 2016-05-19 MED ORDER — DIPHENHYDRAMINE HCL 25 MG PO CAPS
50.0000 mg | ORAL_CAPSULE | Freq: Once | ORAL | Status: AC
  Administered 2016-05-19: 50 mg via ORAL

## 2016-05-19 MED ORDER — PALONOSETRON HCL INJECTION 0.25 MG/5ML
INTRAVENOUS | Status: AC
  Filled 2016-05-19: qty 5

## 2016-05-19 MED ORDER — FOSAPREPITANT DIMEGLUMINE INJECTION 150 MG
Freq: Once | INTRAVENOUS | Status: AC
  Administered 2016-05-19: 10:00:00 via INTRAVENOUS
  Filled 2016-05-19: qty 5

## 2016-05-19 MED ORDER — SODIUM CHLORIDE 0.9 % IV SOLN
45.0000 mg/m2 | Freq: Once | INTRAVENOUS | Status: AC
  Administered 2016-05-19: 80 mg via INTRAVENOUS
  Filled 2016-05-19: qty 4

## 2016-05-19 MED ORDER — SODIUM CHLORIDE 0.9 % IV SOLN
375.0000 mg/m2 | Freq: Once | INTRAVENOUS | Status: AC
  Administered 2016-05-19: 700 mg via INTRAVENOUS
  Filled 2016-05-19: qty 50

## 2016-05-19 MED ORDER — SODIUM CHLORIDE 0.9% FLUSH
10.0000 mL | INTRAVENOUS | Status: DC | PRN
  Administered 2016-05-19: 10 mL
  Filled 2016-05-19: qty 10

## 2016-05-19 MED ORDER — HEPARIN SOD (PORK) LOCK FLUSH 100 UNIT/ML IV SOLN
500.0000 [IU] | Freq: Once | INTRAVENOUS | Status: AC | PRN
  Administered 2016-05-19: 500 [IU]
  Filled 2016-05-19: qty 5

## 2016-05-19 MED ORDER — SODIUM CHLORIDE 0.9 % IV SOLN
675.0000 mg/m2 | Freq: Once | INTRAVENOUS | Status: AC
  Administered 2016-05-19: 1260 mg via INTRAVENOUS
  Filled 2016-05-19: qty 63

## 2016-05-19 MED ORDER — ACETAMINOPHEN 325 MG PO TABS
ORAL_TABLET | ORAL | Status: AC
  Filled 2016-05-19: qty 2

## 2016-05-19 MED ORDER — DIPHENHYDRAMINE HCL 25 MG PO CAPS
ORAL_CAPSULE | ORAL | Status: AC
  Filled 2016-05-19: qty 2

## 2016-05-19 MED ORDER — DENOSUMAB 120 MG/1.7ML ~~LOC~~ SOLN
120.0000 mg | Freq: Once | SUBCUTANEOUS | Status: AC
  Administered 2016-05-19: 120 mg via SUBCUTANEOUS
  Filled 2016-05-19: qty 1.7

## 2016-05-19 MED ORDER — SODIUM CHLORIDE 0.9 % IV SOLN
Freq: Once | INTRAVENOUS | Status: AC
  Administered 2016-05-19: 10:00:00 via INTRAVENOUS

## 2016-05-19 MED ORDER — ACETAMINOPHEN 325 MG PO TABS
650.0000 mg | ORAL_TABLET | Freq: Once | ORAL | Status: AC
  Administered 2016-05-19: 650 mg via ORAL

## 2016-05-19 NOTE — Progress Notes (Signed)
Hematology and Oncology Follow Up Visit  Abbey Fahner [id number] 26-Nov-1950 65 y.o. 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 is here today with his daughter for follow-up and treatment cycle 6. He had a wonderful time on his [place] fishing trip with his daughter. He states that he caught a huge flounder and won at golf every day. He is doing quite well and has no complaints at this time. No fatigue.  He responded nicely to the blood he received in September and his Hgb is stable at 9.3 with an MCV of 88. Earlier this month iron saturation was 44% with a ferritin of 295.  No lymphadenopathy found on exam. No episodes of bleeding or bruising.  His appetite still comes and goes. He has not tried increasing his [name] and has not been taking any at all. He states that she will try taking 2 pills and see if this helps increase his appetite. He is staying hydrated. His weight is up 1 lb since his last visit.  No fever, chills, n/v, oral sores, cough, rash, dizziness, headache, SOB, chest pain, palpitations, abdominal pain or changes in bowel or bladder habits.  No swelling, tenderness, numbness or tingling in his extremities. No c/o joint aches or pain.  No issue with balance or falls. No syncopal episodes.   Medications:    Medication List       Accurate as of 05/19/16  8:45 AM. Always use your most recent med list.          amLODipine 10 MG tablet Commonly known as:  NORVASC Take 10 mg by mouth daily.   aspirin EC 81 MG tablet Take 1 tablet (81 mg total) by mouth daily.   atorvastatin 80 MG tablet Commonly known as:  LIPITOR Take 80 mg by mouth daily.   B-12 5000 MCG Caps Take 5,000 mcg by mouth every morning.     carvedilol 12.5 MG tablet Commonly known as:  COREG Take 12.5 mg by mouth 2 (two) times daily with a meal.   ciprofloxacin 500 MG tablet Commonly known as:  CIPRO Take 1 pill twice a day for 15 days only after each chemotherapy.   clopidogrel 75 MG tablet Commonly known as:  PLAVIX Take 75 mg by mouth daily.   cyclobenzaprine 10 MG tablet Commonly known as:  FLEXERIL Take 1 tablet (10 mg total) by mouth 3 (three) times daily as needed for muscle spasms.   dronabinol 5 MG capsule Commonly known as:  [name] Take 1 capsule (5 mg total) by mouth 2 (two) times daily before a meal.   lidocaine-prilocaine cream Commonly known as:  EMLA Apply 1 application topically daily as needed (port access). Apply to affected area once   LORazepam 0.5 MG tablet Commonly known as:  ATIVAN Take 1 tablet (0.5 mg total) by mouth every 8 (eight) hours.   megestrol 400 MG/10ML suspension Commonly known as:  MEGACE Take 20 mLs (800 mg total) by mouth daily.   METAMUCIL PO Take 3 capsules by mouth every evening. Reported on 10/24/2015   ondansetron 8 MG tablet Commonly known as:  ZOFRAN Take 1 tablet (8 mg total) by mouth 2 (two) times daily as needed for refractory nausea / vomiting. Start  on day 3 after cyclophosphamide chemotherapy.   pantoprazole 20 MG tablet Commonly known as:  PROTONIX Take 1 tablet (20 mg total) by mouth daily.   PHENYTEK 300 MG ER capsule Generic drug:  phenytoin Take 1 capsule (300 mg total) by mouth every morning.   polyethylene glycol packet Commonly known as:  MIRALAX / GLYCOLAX Take 17 g by mouth daily as needed for moderate constipation.   predniSONE 20 MG tablet Commonly known as:  DELTASONE Take 3 tablets (60 mg total) by mouth daily. Take on days 1-5 of chemotherapy.   prochlorperazine 10 MG tablet Commonly known as:  COMPAZINE Take 1 tablet (10 mg total) by mouth every 6 (six) hours as needed (Nausea or vomiting).   pyridOXINE 100 MG  tablet Commonly known as:  VITAMIN B-6 Take 100 mg by mouth every morning.   sucralfate 1 g tablet Commonly known as:  CARAFATE Take 1 tablet (1 g total) by mouth 4 (four) times daily.   valsartan 160 MG tablet Commonly known as:  DIOVAN Take 160 mg by mouth every morning. Reported on 11/05/2015   vitamin B-1 250 MG tablet Take 250 mg by mouth every morning.       Allergies:  Allergies  Allergen Reactions  . Oxycodone Swelling and Other (See Comments)    Tongue and lip    Past Medical History, Surgical history, Social history, and Family History were reviewed and updated.  Review of Systems: All other 10 point review of systems is negative.   Physical Exam:  vitals were not taken for this visit.  Wt Readings from Last 3 Encounters:  05/05/16 144 lb (65.3 kg)  04/14/16 147 lb 8 oz (66.9 kg)  03/24/16 146 lb (66.2 kg)    Ocular: Sclerae unicteric, pupils equal, round and reactive to light Ear-nose-throat: Oropharynx clear, dentition fair Lymphatic: No cervical supraclavicular or axillary adenopathy Lungs no rales or rhonchi, good excursion bilaterally Heart regular rate and rhythm, no murmur appreciated Abd soft, nontender, positive bowel sounds, no liver or spleen tip palpated on exam, no fluid wave MSK no focal spinal tenderness, no joint edema Neuro: non-focal, well-oriented, appropriate affect Breasts: Deferred  Lab Results  Component Value Date   WBC 6.1 05/05/2016   HGB 9.8 (L) 05/05/2016   HCT 28.9 (L) 05/05/2016   MCV 85 05/05/2016   PLT 180 05/05/2016   Lab Results  Component Value Date   FERRITIN 295 05/05/2016   IRON 67 05/05/2016   TIBC 153 (L) 05/05/2016   UIBC 86 (L) 05/05/2016   IRONPCTSAT 44 05/05/2016   Lab Results  Component Value Date   RBC 3.40 (L) 05/05/2016   No results found for: KPAFRELGTCHN, LAMBDASER, KAPLAMBRATIO No results found for: IGGSERUM, IGA, IGMSERUM No results found for: Odetta Pink, SPEI   Chemistry      Component Value Date/Time   NA 137 05/05/2016 0818   K 3.3 05/05/2016 0818   CL 104 05/05/2016 0818   CO2 27 05/05/2016 0818   BUN 10 05/05/2016 0818   CREATININE 1.0 05/05/2016 0818      Component Value Date/Time   CALCIUM 8.3 05/05/2016 0818   ALKPHOS 114 (H) 05/05/2016 0818   AST 22 05/05/2016 0818   ALT 20 05/05/2016 0818   BILITOT 0.40 05/05/2016 0818     Impression and Plan: [name] is a pleasant 66 yo white male with history of clear cell carcinoma right kidney resected back in March 2015. He has  now been diagnosed with diffuse large cell non-Hodgkin's lymphoma. Cytogenetics were negative for "double hit" lymphoma.  So far, he has had a nice response to therapy. He had a small break so that he could go on vacation with his daughter and they had a really great time. He is feeling much better and has no complaints at this time.  CBC and CMP stable. We will proceed with cycle 6 today as planned per Dr. Marin Olp.  We will plan to repeat a PET scan after cycle 8.  He promises to try taking 2 [name] with a meal and see if this help improve his appetite.  He has his current treatment and appointment schedule. We will see him back for MD follow-up on November 8th. Both he and his daughters know to contact our office with any questions or concerns.   Eliezer Bottom, NP 10/18/20178:45 AM

## 2016-05-19 NOTE — Patient Instructions (Signed)
[facility] Discharge Instructions for Patients Receiving Chemotherapy  Today you received the following chemotherapy agents Rituxan, Vincristine, Cytoxan and Etoposide.  To help prevent nausea and vomiting after your treatment, we encourage you to take your nausea medication.   If you develop nausea and vomiting that is not controlled by your nausea medication, call the clinic.   BELOW ARE SYMPTOMS THAT SHOULD BE REPORTED IMMEDIATELY:  *FEVER GREATER THAN 100.5 F  *CHILLS WITH OR WITHOUT FEVER  NAUSEA AND VOMITING THAT IS NOT CONTROLLED WITH YOUR NAUSEA MEDICATION  *UNUSUAL SHORTNESS OF BREATH  *UNUSUAL BRUISING OR BLEEDING  TENDERNESS IN MOUTH AND THROAT WITH OR WITHOUT PRESENCE OF ULCERS  *URINARY PROBLEMS  *BOWEL PROBLEMS  UNUSUAL RASH Items with * indicate a potential emergency and should be followed up as soon as possible.  Feel free to call the clinic you have any questions or concerns. The clinic phone number is [phone].  Please show the Mona at check-in to the Emergency Department and triage nurse.

## 2016-05-20 ENCOUNTER — Ambulatory Visit (HOSPITAL_BASED_OUTPATIENT_CLINIC_OR_DEPARTMENT_OTHER): Payer: Medicare Other

## 2016-05-20 VITALS — BP 155/67 | HR 51 | Temp 98.1°F | Resp 20

## 2016-05-20 DIAGNOSIS — Z5111 Encounter for antineoplastic chemotherapy: Secondary | ICD-10-CM | POA: Diagnosis not present

## 2016-05-20 DIAGNOSIS — C8589 Other specified types of non-Hodgkin lymphoma, extranodal and solid organ sites: Secondary | ICD-10-CM

## 2016-05-20 DIAGNOSIS — C8580 Other specified types of non-Hodgkin lymphoma, unspecified site: Secondary | ICD-10-CM

## 2016-05-20 DIAGNOSIS — C8339 Diffuse large B-cell lymphoma, extranodal and solid organ sites: Secondary | ICD-10-CM

## 2016-05-20 DIAGNOSIS — C8330 Diffuse large B-cell lymphoma, unspecified site: Secondary | ICD-10-CM

## 2016-05-20 MED ORDER — PROCHLORPERAZINE MALEATE 10 MG PO TABS
10.0000 mg | ORAL_TABLET | Freq: Once | ORAL | Status: AC
  Administered 2016-05-20: 10 mg via ORAL

## 2016-05-20 MED ORDER — SODIUM CHLORIDE 0.9% FLUSH
10.0000 mL | INTRAVENOUS | Status: DC | PRN
  Administered 2016-05-20: 10 mL
  Filled 2016-05-20: qty 10

## 2016-05-20 MED ORDER — HEPARIN SOD (PORK) LOCK FLUSH 100 UNIT/ML IV SOLN
500.0000 [IU] | Freq: Once | INTRAVENOUS | Status: AC | PRN
  Administered 2016-05-20: 500 [IU]
  Filled 2016-05-20: qty 5

## 2016-05-20 MED ORDER — SODIUM CHLORIDE 0.9 % IV SOLN
45.0000 mg/m2 | Freq: Once | INTRAVENOUS | Status: AC
  Administered 2016-05-20: 80 mg via INTRAVENOUS
  Filled 2016-05-20: qty 4

## 2016-05-20 MED ORDER — PROCHLORPERAZINE MALEATE 10 MG PO TABS
ORAL_TABLET | ORAL | Status: AC
  Filled 2016-05-20: qty 1

## 2016-05-20 MED ORDER — SODIUM CHLORIDE 0.9 % IV SOLN
Freq: Once | INTRAVENOUS | Status: AC
  Administered 2016-05-20: 10:00:00 via INTRAVENOUS

## 2016-05-20 NOTE — Patient Instructions (Signed)
Etoposide, VP-16 injection  What is this medicine?  ETOPOSIDE, VP-16 (e toe POE side) is a chemotherapy drug. It is used to treat testicular cancer, lung cancer, and other cancers.  This medicine Bielefeld be used for other purposes; ask your health care provider or pharmacist if you have questions.  What should I tell my health care provider before I take this medicine?  They need to know if you have any of these conditions:  -infection  -kidney disease  -low blood counts, like low white cell, platelet, or red cell counts  -an unusual or allergic reaction to etoposide, other chemotherapeutic agents, other medicines, foods, dyes, or preservatives  -pregnant or trying to get pregnant  -breast-feeding  How should I use this medicine?  This medicine is for infusion into a vein. It is administered in a hospital or clinic by a specially trained health care professional.  Talk to your pediatrician regarding the use of this medicine in children. Special care Fleury be needed.  Overdosage: If you think you have taken too much of this medicine contact a poison control center or emergency room at once.  NOTE: This medicine is only for you. Do not share this medicine with others.  What if I miss a dose?  It is important not to miss your dose. Call your doctor or health care professional if you are unable to keep an appointment.  What Christenson interact with this medicine?  -aspirin  -certain medications for seizures like carbamazepine, phenobarbital, phenytoin, valproic acid  -cyclosporine  -levamisole  -warfarin  This list Netto not describe all possible interactions. Give your health care provider a list of all the medicines, herbs, non-prescription drugs, or dietary supplements you use. Also tell them if you smoke, drink alcohol, or use illegal drugs. Some items Colon interact with your medicine.  What should I watch for while using this medicine?  Visit your doctor for checks on your progress. This drug Franey make you feel generally unwell.  This is not uncommon, as chemotherapy can affect healthy cells as well as cancer cells. Report any side effects. Continue your course of treatment even though you feel ill unless your doctor tells you to stop.  In some cases, you Stickle be given additional medicines to help with side effects. Follow all directions for their use.  Call your doctor or health care professional for advice if you get a fever, chills or sore throat, or other symptoms of a cold or flu. Do not treat yourself. This drug decreases your body's ability to fight infections. Try to avoid being around people who are sick.  This medicine Heiner increase your risk to bruise or bleed. Call your doctor or health care professional if you notice any unusual bleeding.  Be careful brushing and flossing your teeth or using a toothpick because you Awwad get an infection or bleed more easily. If you have any dental work done, tell your dentist you are receiving this medicine.  Avoid taking products that contain aspirin, acetaminophen, ibuprofen, naproxen, or ketoprofen unless instructed by your doctor. These medicines Dimiceli hide a fever.  Do not become pregnant while taking this medicine or for at least 6 months after stopping it. Women should inform their doctor if they wish to become pregnant or think they might be pregnant. Women of child-bearing potential will need to have a negative pregnancy test before starting this medicine. There is a potential for serious side effects to an unborn child. Talk to your health care professional or   pharmacist for more information. Do not breast-feed an infant while taking this medicine.  Men must use a latex condom during sexual contact with a woman while taking this medicine and for at least 4 months after stopping it. A latex condom is needed even if you have had a vasectomy. Contact your doctor right away if your partner becomes pregnant. Do not donate sperm while taking this medicine and for at least 4 months after you stop  taking this medicine. Men should inform their doctors if they wish to father a child. This medicine Kernes lower sperm counts.  What side effects Houdeshell I notice from receiving this medicine?  Side effects that you should report to your doctor or health care professional as soon as possible:  -allergic reactions like skin rash, itching or hives, swelling of the face, lips, or tongue  -low blood counts - this medicine Rockefeller decrease the number of white blood cells, red blood cells and platelets. You Wegmann be at increased risk for infections and bleeding.  -signs of infection - fever or chills, cough, sore throat, pain or difficulty passing urine  -signs of decreased platelets or bleeding - bruising, pinpoint red spots on the skin, black, tarry stools, blood in the urine  -signs of decreased red blood cells - unusually weak or tired, fainting spells, lightheadedness  -breathing problems  -changes in vision  -mouth or throat sores or ulcers  -pain, redness, swelling or irritation at the injection site  -pain, tingling, numbness in the hands or feet  -redness, blistering, peeling or loosening of the skin, including inside the mouth  -seizures  -vomiting  Side effects that usually do not require medical attention (report to your doctor or health care professional if they continue or are bothersome):  -diarrhea  -hair loss  -loss of appetite  -nausea  -stomach pain  This list Cowgill not describe all possible side effects. Call your doctor for medical advice about side effects. You Brashier report side effects to FDA at 1-800-FDA-1088.  Where should I keep my medicine?  This drug is given in a hospital or clinic and will not be stored at home.  NOTE: This sheet is a summary. It Sadler not cover all possible information. If you have questions about this medicine, talk to your doctor, pharmacist, or health care provider.      2016, Elsevier/Gold Standard. (2014-03-14 12:32:50)

## 2016-05-21 ENCOUNTER — Ambulatory Visit (HOSPITAL_BASED_OUTPATIENT_CLINIC_OR_DEPARTMENT_OTHER): Payer: Medicare Other

## 2016-05-21 VITALS — BP 164/71 | HR 67 | Temp 98.0°F | Resp 18

## 2016-05-21 DIAGNOSIS — C8330 Diffuse large B-cell lymphoma, unspecified site: Secondary | ICD-10-CM

## 2016-05-21 DIAGNOSIS — Z5111 Encounter for antineoplastic chemotherapy: Secondary | ICD-10-CM

## 2016-05-21 DIAGNOSIS — C8580 Other specified types of non-Hodgkin lymphoma, unspecified site: Secondary | ICD-10-CM

## 2016-05-21 DIAGNOSIS — C8339 Diffuse large B-cell lymphoma, extranodal and solid organ sites: Secondary | ICD-10-CM | POA: Diagnosis not present

## 2016-05-21 DIAGNOSIS — C8589 Other specified types of non-Hodgkin lymphoma, extranodal and solid organ sites: Secondary | ICD-10-CM

## 2016-05-21 MED ORDER — SODIUM CHLORIDE 0.9% FLUSH
10.0000 mL | INTRAVENOUS | Status: DC | PRN
  Administered 2016-05-21: 10 mL
  Filled 2016-05-21: qty 10

## 2016-05-21 MED ORDER — HEPARIN SOD (PORK) LOCK FLUSH 100 UNIT/ML IV SOLN
500.0000 [IU] | Freq: Once | INTRAVENOUS | Status: AC | PRN
  Administered 2016-05-21: 500 [IU]
  Filled 2016-05-21: qty 5

## 2016-05-21 MED ORDER — SODIUM CHLORIDE 0.9 % IV SOLN
45.0000 mg/m2 | Freq: Once | INTRAVENOUS | Status: AC
  Administered 2016-05-21: 80 mg via INTRAVENOUS
  Filled 2016-05-21: qty 4

## 2016-05-21 MED ORDER — SODIUM CHLORIDE 0.9 % IV SOLN
Freq: Once | INTRAVENOUS | Status: AC
  Administered 2016-05-21: 10:00:00 via INTRAVENOUS

## 2016-05-21 MED ORDER — PROCHLORPERAZINE MALEATE 10 MG PO TABS
10.0000 mg | ORAL_TABLET | Freq: Once | ORAL | Status: AC
Start: 2016-05-21 — End: 2016-05-21
  Administered 2016-05-21: 10 mg via ORAL

## 2016-05-21 MED ORDER — PROCHLORPERAZINE MALEATE 10 MG PO TABS
ORAL_TABLET | ORAL | Status: AC
Start: 2016-05-21 — End: 2016-05-21
  Filled 2016-05-21: qty 1

## 2016-05-21 MED FILL — CIPROFLOXACIN HCL 500 MG TA: 500 | 15 days supply | Qty: 30 | Fill #2

## 2016-05-21 NOTE — Patient Instructions (Signed)
[facility] Discharge Instructions for Patients Receiving Chemotherapy  Today you received the following chemotherapy agents:  Etoposide  To help prevent nausea and vomiting after your treatment, we encourage you to take your nausea medication.   If you develop nausea and vomiting that is not controlled by your nausea medication, call the clinic.   BELOW ARE SYMPTOMS THAT SHOULD BE REPORTED IMMEDIATELY:  *FEVER GREATER THAN 100.5 F  *CHILLS WITH OR WITHOUT FEVER  NAUSEA AND VOMITING THAT IS NOT CONTROLLED WITH YOUR NAUSEA MEDICATION  *UNUSUAL SHORTNESS OF BREATH  *UNUSUAL BRUISING OR BLEEDING  TENDERNESS IN MOUTH AND THROAT WITH OR WITHOUT PRESENCE OF ULCERS  *URINARY PROBLEMS  *BOWEL PROBLEMS  UNUSUAL RASH Items with * indicate a potential emergency and should be followed up as soon as possible.  Feel free to call the clinic you have any questions or concerns. The clinic phone number is [phone].  Please show the CHEMO ALERT CARD at check-in to the Emergency Department and triage nurse.   

## 2016-05-25 ENCOUNTER — Ambulatory Visit: Payer: Medicare Other

## 2016-05-25 ENCOUNTER — Ambulatory Visit: Payer: Medicare Other | Admitting: Family

## 2016-05-25 ENCOUNTER — Other Ambulatory Visit: Payer: Medicare Other

## 2016-05-26 ENCOUNTER — Ambulatory Visit: Payer: Medicare Other

## 2016-05-26 ENCOUNTER — Ambulatory Visit: Payer: Medicare Other | Admitting: Family

## 2016-05-26 ENCOUNTER — Other Ambulatory Visit: Payer: Medicare Other

## 2016-05-27 ENCOUNTER — Ambulatory Visit: Payer: Medicare Other

## 2016-05-28 ENCOUNTER — Ambulatory Visit: Payer: Medicare Other

## 2016-06-02 ENCOUNTER — Other Ambulatory Visit: Payer: Self-pay | Admitting: Hematology & Oncology

## 2016-06-02 DIAGNOSIS — C8330 Diffuse large B-cell lymphoma, unspecified site: Secondary | ICD-10-CM

## 2016-06-02 DIAGNOSIS — H2513 Age-related nuclear cataract, bilateral: Secondary | ICD-10-CM | POA: Diagnosis not present

## 2016-06-02 DIAGNOSIS — C8589 Other specified types of non-Hodgkin lymphoma, extranodal and solid organ sites: Secondary | ICD-10-CM

## 2016-06-02 DIAGNOSIS — H40023 Open angle with borderline findings, high risk, bilateral: Secondary | ICD-10-CM | POA: Diagnosis not present

## 2016-06-02 DIAGNOSIS — N183 Chronic kidney disease, stage 3 (moderate): Secondary | ICD-10-CM | POA: Diagnosis not present

## 2016-06-02 DIAGNOSIS — H18413 Arcus senilis, bilateral: Secondary | ICD-10-CM | POA: Diagnosis not present

## 2016-06-02 DIAGNOSIS — H269 Unspecified cataract: Secondary | ICD-10-CM | POA: Diagnosis not present

## 2016-06-02 DIAGNOSIS — C8580 Other specified types of non-Hodgkin lymphoma, unspecified site: Secondary | ICD-10-CM

## 2016-06-02 DIAGNOSIS — H40033 Anatomical narrow angle, bilateral: Secondary | ICD-10-CM | POA: Diagnosis not present

## 2016-06-09 ENCOUNTER — Ambulatory Visit (HOSPITAL_COMMUNITY)
Admission: RE | Admit: 2016-06-09 | Discharge: 2016-06-09 | Disposition: A | Discharge status: Home / Self Care | Payer: Medicare Other | Source: Ambulatory Visit | Attending: Hematology & Oncology | Admitting: Hematology & Oncology

## 2016-06-09 ENCOUNTER — Other Ambulatory Visit (HOSPITAL_BASED_OUTPATIENT_CLINIC_OR_DEPARTMENT_OTHER): Payer: Medicare Other

## 2016-06-09 ENCOUNTER — Ambulatory Visit (HOSPITAL_BASED_OUTPATIENT_CLINIC_OR_DEPARTMENT_OTHER): Payer: Medicare Other

## 2016-06-09 ENCOUNTER — Ambulatory Visit (HOSPITAL_BASED_OUTPATIENT_CLINIC_OR_DEPARTMENT_OTHER): Payer: Medicare Other | Admitting: Hematology & Oncology

## 2016-06-09 ENCOUNTER — Other Ambulatory Visit: Payer: Self-pay | Admitting: Family

## 2016-06-09 VITALS — BP 101/48 | HR 57 | Temp 98.0°F

## 2016-06-09 VITALS — BP 140/79 | HR 82 | Temp 98.2°F | Wt 141.0 lb

## 2016-06-09 DIAGNOSIS — C8589 Other specified types of non-Hodgkin lymphoma, extranodal and solid organ sites: Secondary | ICD-10-CM

## 2016-06-09 DIAGNOSIS — Z5112 Encounter for antineoplastic immunotherapy: Secondary | ICD-10-CM | POA: Diagnosis not present

## 2016-06-09 DIAGNOSIS — C8339 Diffuse large B-cell lymphoma, extranodal and solid organ sites: Secondary | ICD-10-CM

## 2016-06-09 DIAGNOSIS — D6481 Anemia due to antineoplastic chemotherapy: Secondary | ICD-10-CM

## 2016-06-09 DIAGNOSIS — C8330 Diffuse large B-cell lymphoma, unspecified site: Secondary | ICD-10-CM

## 2016-06-09 DIAGNOSIS — Z8553 Personal history of malignant neoplasm of renal pelvis: Secondary | ICD-10-CM | POA: Diagnosis not present

## 2016-06-09 DIAGNOSIS — I97418 Intraoperative hemorrhage and hematoma of a circulatory system organ or structure complicating other circulatory system procedure: Secondary | ICD-10-CM

## 2016-06-09 DIAGNOSIS — Z5111 Encounter for antineoplastic chemotherapy: Secondary | ICD-10-CM

## 2016-06-09 DIAGNOSIS — C8580 Other specified types of non-Hodgkin lymphoma, unspecified site: Secondary | ICD-10-CM

## 2016-06-09 DIAGNOSIS — D630 Anemia in neoplastic disease: Secondary | ICD-10-CM

## 2016-06-09 LAB — CBC WITH DIFFERENTIAL (CANCER CENTER ONLY)
BASO#: 0 10*3/uL (ref 0.0–0.2)
BASO%: 0.4 % (ref 0.0–2.0)
EOS ABS: 0.1 10*3/uL (ref 0.0–0.5)
EOS%: 0.9 % (ref 0.0–7.0)
HEMATOCRIT: 24.8 % — AB (ref 38.7–49.9)
HEMOGLOBIN: 8.5 g/dL — AB (ref 13.0–17.1)
LYMPH#: 0.5 10*3/uL — AB (ref 0.9–3.3)
LYMPH%: 8.8 % — ABNORMAL LOW (ref 14.0–48.0)
MCH: 29.7 pg (ref 28.0–33.4)
MCHC: 34.3 g/dL (ref 32.0–35.9)
MCV: 87 fL (ref 82–98)
MONO#: 0.5 10*3/uL (ref 0.1–0.9)
MONO%: 8.3 % (ref 0.0–13.0)
NEUT%: 81.6 % — AB (ref 40.0–80.0)
NEUTROS ABS: 4.5 10*3/uL (ref 1.5–6.5)
Platelets: 159 10*3/uL (ref 145–400)
RBC: 2.86 10*6/uL — ABNORMAL LOW (ref 4.20–5.70)
RDW: 15.8 % — AB (ref 11.1–15.7)
WBC: 5.6 10*3/uL (ref 4.0–10.0)

## 2016-06-09 LAB — CMP (CANCER CENTER ONLY)
ALBUMIN: 2.3 g/dL — AB (ref 3.3–5.5)
ALT(SGPT): 14 U/L (ref 10–47)
AST: 20 U/L (ref 11–38)
Alkaline Phosphatase: 70 U/L (ref 26–84)
BILIRUBIN TOTAL: 0.5 mg/dL (ref 0.20–1.60)
BUN, Bld: 10 mg/dL (ref 7–22)
CALCIUM: 8.2 mg/dL (ref 8.0–10.3)
CO2: 25 meq/L (ref 18–33)
Chloride: 104 mEq/L (ref 98–108)
Creat: 0.9 mg/dl (ref 0.6–1.2)
Glucose, Bld: 102 mg/dL (ref 73–118)
POTASSIUM: 3.2 meq/L — AB (ref 3.3–4.7)
Sodium: 136 mEq/L (ref 128–145)
Total Protein: 5.8 g/dL — ABNORMAL LOW (ref 6.4–8.1)

## 2016-06-09 LAB — TECHNOLOGIST REVIEW CHCC SATELLITE

## 2016-06-09 LAB — LACTATE DEHYDROGENASE: LDH: 149 U/L (ref 125–245)

## 2016-06-09 MED ORDER — SODIUM CHLORIDE 0.9 % IV SOLN
Freq: Once | INTRAVENOUS | Status: AC
  Administered 2016-06-09: 11:00:00 via INTRAVENOUS
  Filled 2016-06-09: qty 5

## 2016-06-09 MED ORDER — PALONOSETRON HCL INJECTION 0.25 MG/5ML
0.2500 mg | Freq: Once | INTRAVENOUS | Status: AC
  Administered 2016-06-09: 0.25 mg via INTRAVENOUS

## 2016-06-09 MED ORDER — ACETAMINOPHEN 325 MG PO TABS
650.0000 mg | ORAL_TABLET | Freq: Once | ORAL | Status: AC
  Administered 2016-06-09: 650 mg via ORAL

## 2016-06-09 MED ORDER — SODIUM CHLORIDE 0.9% FLUSH
10.0000 mL | INTRAVENOUS | Status: DC | PRN
  Administered 2016-06-09: 10 mL
  Filled 2016-06-09: qty 10

## 2016-06-09 MED ORDER — SODIUM CHLORIDE 0.9 % IV SOLN
675.0000 mg/m2 | Freq: Once | INTRAVENOUS | Status: AC
  Administered 2016-06-09: 1260 mg via INTRAVENOUS
  Filled 2016-06-09: qty 63

## 2016-06-09 MED ORDER — ACETAMINOPHEN 325 MG PO TABS
ORAL_TABLET | ORAL | Status: AC
  Filled 2016-06-09: qty 2

## 2016-06-09 MED ORDER — SODIUM CHLORIDE 0.9 % IV SOLN
45.0000 mg/m2 | Freq: Once | INTRAVENOUS | Status: AC
  Administered 2016-06-09: 80 mg via INTRAVENOUS
  Filled 2016-06-09: qty 4

## 2016-06-09 MED ORDER — DIPHENHYDRAMINE HCL 25 MG PO CAPS
ORAL_CAPSULE | ORAL | Status: AC
  Filled 2016-06-09: qty 2

## 2016-06-09 MED ORDER — PALONOSETRON HCL INJECTION 0.25 MG/5ML
INTRAVENOUS | Status: AC
  Filled 2016-06-09: qty 5

## 2016-06-09 MED ORDER — SODIUM CHLORIDE 0.9 % IV SOLN
375.0000 mg/m2 | Freq: Once | INTRAVENOUS | Status: AC
  Administered 2016-06-09: 700 mg via INTRAVENOUS
  Filled 2016-06-09: qty 50

## 2016-06-09 MED ORDER — VINCRISTINE SULFATE CHEMO INJECTION 1 MG/ML
2.0000 mg | Freq: Once | INTRAVENOUS | Status: AC
  Administered 2016-06-09: 2 mg via INTRAVENOUS
  Filled 2016-06-09: qty 2

## 2016-06-09 MED ORDER — SODIUM CHLORIDE 0.9 % IV SOLN
Freq: Once | INTRAVENOUS | Status: AC
  Administered 2016-06-09: 10:00:00 via INTRAVENOUS

## 2016-06-09 MED ORDER — DIPHENHYDRAMINE HCL 25 MG PO CAPS
50.0000 mg | ORAL_CAPSULE | Freq: Once | ORAL | Status: AC
  Administered 2016-06-09: 50 mg via ORAL

## 2016-06-09 MED ORDER — HEPARIN SOD (PORK) LOCK FLUSH 100 UNIT/ML IV SOLN
500.0000 [IU] | Freq: Once | INTRAVENOUS | Status: AC | PRN
  Administered 2016-06-09: 500 [IU]
  Filled 2016-06-09: qty 5

## 2016-06-09 NOTE — Patient Instructions (Addendum)
[facility] Discharge Instructions for Patients Receiving Chemotherapy  Today you received the following chemotherapy agents Rituxan, Vincristine, Cytoxan  To help prevent nausea and vomiting after your treatment, we encourage you to take your nausea medication    If you develop nausea and vomiting that is not controlled by your nausea medication, call the clinic.   BELOW ARE SYMPTOMS THAT SHOULD BE REPORTED IMMEDIATELY:  *FEVER GREATER THAN 100.5 F  *CHILLS WITH OR WITHOUT FEVER  NAUSEA AND VOMITING THAT IS NOT CONTROLLED WITH YOUR NAUSEA MEDICATION  *UNUSUAL SHORTNESS OF BREATH  *UNUSUAL BRUISING OR BLEEDING  TENDERNESS IN MOUTH AND THROAT WITH OR WITHOUT PRESENCE OF ULCERS  *URINARY PROBLEMS  *BOWEL PROBLEMS  UNUSUAL RASH Items with * indicate a potential emergency and should be followed up as soon as possible.  Feel free to call the clinic you have any questions or concerns. The clinic phone number is [phone].  Please show the [facility] at check-in to the Emergency Department and triage nurse.

## 2016-06-10 ENCOUNTER — Ambulatory Visit: Payer: Medicare Other

## 2016-06-10 ENCOUNTER — Other Ambulatory Visit: Payer: Self-pay | Admitting: *Deleted

## 2016-06-10 ENCOUNTER — Other Ambulatory Visit: Payer: Self-pay | Admitting: Family

## 2016-06-10 ENCOUNTER — Ambulatory Visit (HOSPITAL_BASED_OUTPATIENT_CLINIC_OR_DEPARTMENT_OTHER): Payer: Medicare Other

## 2016-06-10 VITALS — BP 137/83 | HR 65 | Temp 97.8°F | Resp 18

## 2016-06-10 DIAGNOSIS — C8580 Other specified types of non-Hodgkin lymphoma, unspecified site: Principal | ICD-10-CM

## 2016-06-10 DIAGNOSIS — C8589 Other specified types of non-Hodgkin lymphoma, extranodal and solid organ sites: Secondary | ICD-10-CM | POA: Diagnosis present

## 2016-06-10 DIAGNOSIS — D630 Anemia in neoplastic disease: Secondary | ICD-10-CM | POA: Diagnosis not present

## 2016-06-10 DIAGNOSIS — C8339 Diffuse large B-cell lymphoma, extranodal and solid organ sites: Secondary | ICD-10-CM

## 2016-06-10 DIAGNOSIS — Z5111 Encounter for antineoplastic chemotherapy: Secondary | ICD-10-CM

## 2016-06-10 DIAGNOSIS — C8330 Diffuse large B-cell lymphoma, unspecified site: Secondary | ICD-10-CM

## 2016-06-10 DIAGNOSIS — D6481 Anemia due to antineoplastic chemotherapy: Secondary | ICD-10-CM | POA: Diagnosis present

## 2016-06-10 MED ORDER — PROCHLORPERAZINE MALEATE 10 MG PO TABS
10.0000 mg | ORAL_TABLET | Freq: Once | ORAL | Status: AC
  Administered 2016-06-10: 10 mg via ORAL

## 2016-06-10 MED ORDER — SODIUM CHLORIDE 0.9% FLUSH
10.0000 mL | INTRAVENOUS | Status: DC | PRN
  Administered 2016-06-10: 10 mL
  Filled 2016-06-10: qty 10

## 2016-06-10 MED ORDER — SODIUM CHLORIDE 0.9 % IV SOLN
Freq: Once | INTRAVENOUS | Status: AC
  Administered 2016-06-10: 11:00:00 via INTRAVENOUS

## 2016-06-10 MED ORDER — HEPARIN SOD (PORK) LOCK FLUSH 100 UNIT/ML IV SOLN
500.0000 [IU] | Freq: Once | INTRAVENOUS | Status: AC | PRN
  Administered 2016-06-10: 500 [IU]
  Filled 2016-06-10: qty 5

## 2016-06-10 MED ORDER — SODIUM CHLORIDE 0.9 % IV SOLN
45.0000 mg/m2 | Freq: Once | INTRAVENOUS | Status: AC
  Administered 2016-06-10: 80 mg via INTRAVENOUS
  Filled 2016-06-10: qty 4

## 2016-06-10 MED ORDER — PROCHLORPERAZINE MALEATE 10 MG PO TABS
ORAL_TABLET | ORAL | Status: AC
  Filled 2016-06-10: qty 1

## 2016-06-10 NOTE — Progress Notes (Signed)
Hematology and Oncology Follow Up Visit  [name] [id number] 02-Oct-1950 65 y.o. 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 He looks a little pale. He has lost a little more weight. He is not taking the [name]. He just does not have much of an appetite. I'm unsure is taking the Megace.  He's having no issues with pain. He's had no cough or shortness of breath. He's had no issues with congestive heart failure. He's had no change in bowel or bladder habits.  There's been no rashes. He's had no leg swelling.  This been some nausea but no vomiting.  Idea that he actually is tolerated chemotherapy pretty well.  Currently, his performance status is ECOG 1-2.   Medications:    Medication List       Accurate as of 06/09/16 11:59 PM. Always use your most recent med list.          allopurinol 300 MG tablet Commonly known as:  ZYLOPRIM TAKE ONE TABLET BY MOUTH ONCE DAILY   amLODipine 10 MG tablet Commonly known as:  NORVASC Take 10 mg by mouth daily.   aspirin EC 81 MG tablet Take 1 tablet (81 mg total) by mouth daily.   atorvastatin 80 MG tablet Commonly known as:  LIPITOR Take 80 mg by mouth daily.   B-12 5000 MCG Caps Take 5,000 mcg by mouth every morning.   carvedilol 12.5 MG tablet Commonly known as:  COREG Take 12.5 mg by mouth 2 (two) times daily with a meal.   ciprofloxacin 500 MG tablet Commonly known as:  CIPRO Take 1 pill twice a day for 15 days only after each chemotherapy.   clopidogrel 75 MG tablet Commonly known as:  PLAVIX Take 75 mg by mouth daily.   cyclobenzaprine 10 MG tablet Commonly known as:  FLEXERIL Take 1 tablet (10 mg total) by mouth 3  (three) times daily as needed for muscle spasms.   dronabinol 5 MG capsule Commonly known as:  [name] Take 1 capsule (5 mg total) by mouth 2 (two) times daily before a meal.   lidocaine-prilocaine cream Commonly known as:  EMLA Apply 1 application topically daily as needed (port access). Apply to affected area once   LORazepam 0.5 MG tablet Commonly known as:  ATIVAN Take 1 tablet (0.5 mg total) by mouth every 8 (eight) hours.   megestrol 400 MG/10ML suspension Commonly known as:  MEGACE Take 20 mLs (800 mg total) by mouth daily.   METAMUCIL PO Take 3 capsules by mouth every evening. Reported on 10/24/2015   ondansetron 8 MG tablet Commonly known as:  ZOFRAN Take 1 tablet (8 mg total) by mouth 2 (two) times daily as needed for refractory nausea / vomiting. Start on day 3 after cyclophosphamide chemotherapy.   pantoprazole 20 MG tablet Commonly known as:  PROTONIX Take 1 tablet (20 mg total) by mouth daily.   PHENYTEK 300 MG ER capsule Generic drug:  phenytoin Take 1 capsule (300 mg total) by mouth every morning.   polyethylene glycol packet Commonly known as:  MIRALAX / GLYCOLAX Take 17 g by mouth daily as needed for moderate constipation.   predniSONE 20 MG tablet Commonly known as:  DELTASONE Take 3 tablets (60 mg total) by mouth daily. Take on days 1-5 of chemotherapy.   prochlorperazine 10 MG tablet Commonly known as:  COMPAZINE Take 1 tablet (10 mg total) by mouth every 6 (six) hours as needed (Nausea or vomiting).   pyridOXINE 100 MG tablet Commonly known as:  VITAMIN B-6 Take 100 mg by mouth every morning.   valsartan 160 MG tablet Commonly known as:  DIOVAN Take 160 mg by mouth every morning. Reported on 11/05/2015   vitamin B-1 250 MG tablet Take 250 mg by mouth every morning.       Allergies:  Allergies  Allergen Reactions  . Oxycodone Swelling and Other (See Comments)    Tongue and lip    Past Medical History, Surgical history, Social  history, and Family History were reviewed and updated.  Review of Systems: All other 10 point review of systems is negative.   Physical Exam:  weight is 141 lb 0.6 oz (64 kg). His oral temperature is 98.2 F (36.8 C). His blood pressure is 140/79 and his pulse is 82.   Wt Readings from Last 3 Encounters:  06/09/16 141 lb 0.6 oz (64 kg)  05/19/16 145 lb 12.8 oz (66.1 kg)  05/05/16 144 lb (65.3 kg)    Well-developed and well-nourished white male in no obvious distress. Head and neck exam shows no ocular or oral lesions. No adenopathy noted in the neck. Lungs are clear. Neck exam regular in rhythm with no murmurs, rubs or bruits. Abdomen is soft. Has good bowel sounds. There is no fluid wave. There is no palpable liver or spleen tip. Axillar exam shows no bilateral axillary adenopathy. I did do a rectal exam on him. There is a small external hemorrhoid. There is no masses in the rectal vault. His stool was brown and heme negative Back exam shows no tenderness over the spine, ribs or hips. Extremities shows no clubbing, cyanosis or edema. There Joye be some slight swelling in the right forearm. He has no tenderness over his long bones. He has good range of motion of his joints. Skin exam shows areas of vitiligo. Neurological exam shows no focal deficits.  Lab Results  Component Value Date   WBC 5.6 06/09/2016   HGB 8.5 (L) 06/09/2016   HCT 24.8 (L) 06/09/2016   MCV 87 06/09/2016   PLT 159 06/09/2016   Lab Results  Component Value Date   FERRITIN 295 05/05/2016   IRON 67 05/05/2016   TIBC 153 (L) 05/05/2016   UIBC 86 (L) 05/05/2016   IRONPCTSAT 44 05/05/2016   Lab Results  Component Value Date   RBC 2.86 (L) 06/09/2016   No results found for: KPAFRELGTCHN, LAMBDASER, KAPLAMBRATIO No results found for: IGGSERUM, IGA, IGMSERUM No results found for: Ronnald Ramp, A1GS, A2GS, Tillman Sers, SPEI   Chemistry      Component Value Date/Time   NA 136 06/09/2016  0830   K 3.2 (L) 06/09/2016 0830   CL 104 06/09/2016 0830   CO2 25 06/09/2016 0830   BUN 10 06/09/2016 0830   CREATININE 0.9 06/09/2016 0830      Component Value Date/Time   CALCIUM 8.2 06/09/2016 0830   ALKPHOS 70 06/09/2016 0830   AST 20 06/09/2016 0830   ALT 14 06/09/2016 0830   BILITOT 0.50 06/09/2016 0830     Impression and Plan: Gilbert [name] is a pleasant 65 yo white male with history of clear cell carcinoma right kidney resected back in March 2015. He  has now been diagnosed with diffuse large cell non-Hodgkin's lymphoma. Cytogenetics were negative for "double hit" lymphoma.  MUGA showed an EF of 42%. He is now being treated with R-CEOP.  Had a repeat echocardiogram done in October. This showed an ejection fraction of 40-45%.  I think is going need another blood transfusion. I think that this is some that will help him. Hopefully, he will again be better. He really needs to take the [name] to help with his appetite. Hopefully he will take this. I told him to take it 3 times a day if necessary.  I think we will proceed with chemotherapy. Overall, I think he has done well with it. I think he is responded nicely to it.  I still plan for a total of 8 cycles of treatment. I will then repeat the PET scan after the 8 cycles.  I will plan to see him back in 3 weeks. We will transfuse him on Friday.  I spent about 25 minutes with he and his daughter. As always, it is very nice talking with them.   Volanda Napoleon, MD    11/9/20177:10 AM

## 2016-06-11 ENCOUNTER — Ambulatory Visit (HOSPITAL_BASED_OUTPATIENT_CLINIC_OR_DEPARTMENT_OTHER): Payer: Medicare Other

## 2016-06-11 VITALS — BP 120/64 | HR 56 | Temp 97.8°F | Resp 20

## 2016-06-11 DIAGNOSIS — C8339 Diffuse large B-cell lymphoma, extranodal and solid organ sites: Secondary | ICD-10-CM

## 2016-06-11 DIAGNOSIS — C8580 Other specified types of non-Hodgkin lymphoma, unspecified site: Secondary | ICD-10-CM | POA: Diagnosis not present

## 2016-06-11 DIAGNOSIS — Z5111 Encounter for antineoplastic chemotherapy: Secondary | ICD-10-CM

## 2016-06-11 DIAGNOSIS — C8589 Other specified types of non-Hodgkin lymphoma, extranodal and solid organ sites: Secondary | ICD-10-CM

## 2016-06-11 DIAGNOSIS — D630 Anemia in neoplastic disease: Secondary | ICD-10-CM

## 2016-06-11 DIAGNOSIS — C8330 Diffuse large B-cell lymphoma, unspecified site: Secondary | ICD-10-CM

## 2016-06-11 LAB — PREPARE RBC (CROSSMATCH)

## 2016-06-11 MED ORDER — SODIUM CHLORIDE 0.9 % IV SOLN
45.0000 mg/m2 | Freq: Once | INTRAVENOUS | Status: AC
  Administered 2016-06-11: 80 mg via INTRAVENOUS
  Filled 2016-06-11: qty 4

## 2016-06-11 MED ORDER — DIPHENHYDRAMINE HCL 25 MG PO CAPS
ORAL_CAPSULE | ORAL | Status: AC
  Filled 2016-06-11: qty 1

## 2016-06-11 MED ORDER — ACETAMINOPHEN 325 MG PO TABS
650.0000 mg | ORAL_TABLET | Freq: Once | ORAL | Status: AC
  Administered 2016-06-11: 650 mg via ORAL

## 2016-06-11 MED ORDER — ACETAMINOPHEN 325 MG PO TABS
ORAL_TABLET | ORAL | Status: AC
  Filled 2016-06-11: qty 2

## 2016-06-11 MED ORDER — PROCHLORPERAZINE MALEATE 10 MG PO TABS
10.0000 mg | ORAL_TABLET | Freq: Once | ORAL | Status: AC
  Administered 2016-06-11: 10 mg via ORAL

## 2016-06-11 MED ORDER — SODIUM CHLORIDE 0.9% FLUSH
10.0000 mL | INTRAVENOUS | Status: DC | PRN
  Administered 2016-06-11: 10 mL
  Filled 2016-06-11: qty 10

## 2016-06-11 MED ORDER — FUROSEMIDE 10 MG/ML IJ SOLN
INTRAMUSCULAR | Status: AC
  Filled 2016-06-11: qty 4

## 2016-06-11 MED ORDER — DIPHENHYDRAMINE HCL 25 MG PO CAPS
25.0000 mg | ORAL_CAPSULE | Freq: Once | ORAL | Status: AC
  Administered 2016-06-11: 25 mg via ORAL

## 2016-06-11 MED ORDER — FUROSEMIDE 10 MG/ML IJ SOLN
20.0000 mg | Freq: Once | INTRAMUSCULAR | Status: AC
  Administered 2016-06-11: 20 mg via INTRAVENOUS

## 2016-06-11 MED ORDER — HEPARIN SOD (PORK) LOCK FLUSH 100 UNIT/ML IV SOLN
500.0000 [IU] | Freq: Once | INTRAVENOUS | Status: AC | PRN
  Administered 2016-06-11: 500 [IU]
  Filled 2016-06-11: qty 5

## 2016-06-11 MED ORDER — PROCHLORPERAZINE MALEATE 10 MG PO TABS
ORAL_TABLET | ORAL | Status: AC
  Filled 2016-06-11: qty 1

## 2016-06-11 MED ORDER — SODIUM CHLORIDE 0.9 % IV SOLN
Freq: Once | INTRAVENOUS | Status: AC
  Administered 2016-06-11: 10:00:00 via INTRAVENOUS

## 2016-06-11 NOTE — Patient Instructions (Signed)
[facility] Discharge Instructions for Patients Receiving Chemotherapy  Today you received the following chemotherapy agents VP -16 To help prevent nausea and vomiting after your treatment, we encourage you to take your nausea medication as prescribed.   If you develop nausea and vomiting that is not controlled by your nausea medication, call the clinic.   BELOW ARE SYMPTOMS THAT SHOULD BE REPORTED IMMEDIATELY:  *FEVER GREATER THAN 100.5 F  *CHILLS WITH OR WITHOUT FEVER  NAUSEA AND VOMITING THAT IS NOT CONTROLLED WITH YOUR NAUSEA MEDICATION  *UNUSUAL SHORTNESS OF BREATH  *UNUSUAL BRUISING OR BLEEDING  TENDERNESS IN MOUTH AND THROAT WITH OR WITHOUT PRESENCE OF ULCERS  *URINARY PROBLEMS  *BOWEL PROBLEMS  UNUSUAL RASH Items with * indicate a potential emergency and should be followed up as soon as possible.  Feel free to call the clinic you have any questions or concerns. The clinic phone number is [phone].  Please show the CHEMO ALERT CARD at check-in to the Emergency Department and triage nurse.   

## 2016-06-14 ENCOUNTER — Encounter: Payer: Self-pay | Admitting: Hematology & Oncology

## 2016-06-14 LAB — TYPE AND SCREEN
ABO/RH(D): A POS
ANTIBODY SCREEN: NEGATIVE
UNIT DIVISION: 0
Unit division: 0
Unit division: 0
Unit division: 0

## 2016-06-16 ENCOUNTER — Ambulatory Visit: Payer: Medicare Other

## 2016-06-16 ENCOUNTER — Other Ambulatory Visit: Payer: Medicare Other

## 2016-06-16 ENCOUNTER — Ambulatory Visit: Payer: Medicare Other | Admitting: Family

## 2016-06-17 ENCOUNTER — Ambulatory Visit: Payer: Medicare Other

## 2016-06-18 ENCOUNTER — Ambulatory Visit: Payer: Medicare Other

## 2016-06-28 DIAGNOSIS — I2510 Atherosclerotic heart disease of native coronary artery without angina pectoris: Secondary | ICD-10-CM | POA: Diagnosis not present

## 2016-06-28 DIAGNOSIS — C799 Secondary malignant neoplasm of unspecified site: Secondary | ICD-10-CM | POA: Diagnosis not present

## 2016-06-28 DIAGNOSIS — E7800 Pure hypercholesterolemia, unspecified: Secondary | ICD-10-CM | POA: Diagnosis not present

## 2016-06-28 DIAGNOSIS — R0602 Shortness of breath: Secondary | ICD-10-CM | POA: Diagnosis not present

## 2016-06-29 DIAGNOSIS — N183 Chronic kidney disease, stage 3 (moderate): Secondary | ICD-10-CM | POA: Diagnosis not present

## 2016-06-29 DIAGNOSIS — R809 Proteinuria, unspecified: Secondary | ICD-10-CM | POA: Diagnosis not present

## 2016-06-29 DIAGNOSIS — I10 Essential (primary) hypertension: Secondary | ICD-10-CM | POA: Diagnosis not present

## 2016-06-30 ENCOUNTER — Ambulatory Visit: Payer: Medicare Other

## 2016-06-30 ENCOUNTER — Ambulatory Visit (HOSPITAL_BASED_OUTPATIENT_CLINIC_OR_DEPARTMENT_OTHER): Payer: Medicare Other

## 2016-06-30 ENCOUNTER — Other Ambulatory Visit (HOSPITAL_BASED_OUTPATIENT_CLINIC_OR_DEPARTMENT_OTHER): Payer: Medicare Other

## 2016-06-30 ENCOUNTER — Ambulatory Visit (HOSPITAL_BASED_OUTPATIENT_CLINIC_OR_DEPARTMENT_OTHER): Payer: Medicare Other | Admitting: Hematology & Oncology

## 2016-06-30 VITALS — BP 120/51 | HR 59 | Temp 97.7°F | Resp 17

## 2016-06-30 VITALS — BP 157/72 | HR 67 | Temp 98.1°F | Resp 20 | Wt 140.0 lb

## 2016-06-30 DIAGNOSIS — C8580 Other specified types of non-Hodgkin lymphoma, unspecified site: Principal | ICD-10-CM

## 2016-06-30 DIAGNOSIS — Z5112 Encounter for antineoplastic immunotherapy: Secondary | ICD-10-CM | POA: Diagnosis not present

## 2016-06-30 DIAGNOSIS — Z5111 Encounter for antineoplastic chemotherapy: Secondary | ICD-10-CM | POA: Diagnosis not present

## 2016-06-30 DIAGNOSIS — D6481 Anemia due to antineoplastic chemotherapy: Secondary | ICD-10-CM | POA: Diagnosis not present

## 2016-06-30 DIAGNOSIS — C8330 Diffuse large B-cell lymphoma, unspecified site: Secondary | ICD-10-CM

## 2016-06-30 DIAGNOSIS — T387X5A Adverse effect of androgens and anabolic congeners, initial encounter: Secondary | ICD-10-CM

## 2016-06-30 DIAGNOSIS — M818 Other osteoporosis without current pathological fracture: Secondary | ICD-10-CM

## 2016-06-30 DIAGNOSIS — C8339 Diffuse large B-cell lymphoma, extranodal and solid organ sites: Secondary | ICD-10-CM

## 2016-06-30 DIAGNOSIS — I97418 Intraoperative hemorrhage and hematoma of a circulatory system organ or structure complicating other circulatory system procedure: Secondary | ICD-10-CM | POA: Diagnosis not present

## 2016-06-30 DIAGNOSIS — C8589 Other specified types of non-Hodgkin lymphoma, extranodal and solid organ sites: Secondary | ICD-10-CM

## 2016-06-30 LAB — CMP (CANCER CENTER ONLY)
ALK PHOS: 109 U/L — AB (ref 26–84)
ALT: 11 U/L (ref 10–47)
AST: 23 U/L (ref 11–38)
Albumin: 2.6 g/dL — ABNORMAL LOW (ref 3.3–5.5)
BUN: 10 mg/dL (ref 7–22)
CALCIUM: 8.6 mg/dL (ref 8.0–10.3)
CHLORIDE: 105 meq/L (ref 98–108)
CO2: 26 mEq/L (ref 18–33)
Creat: 0.8 mg/dl (ref 0.6–1.2)
GLUCOSE: 92 mg/dL (ref 73–118)
POTASSIUM: 3.3 meq/L (ref 3.3–4.7)
Sodium: 142 mEq/L (ref 128–145)
TOTAL PROTEIN: 5.7 g/dL — AB (ref 6.4–8.1)
Total Bilirubin: 0.4 mg/dl (ref 0.20–1.60)

## 2016-06-30 LAB — CBC WITH DIFFERENTIAL (CANCER CENTER ONLY)
BASO#: 0.1 10*3/uL (ref 0.0–0.2)
BASO%: 0.9 % (ref 0.0–2.0)
EOS ABS: 0.1 10*3/uL (ref 0.0–0.5)
EOS%: 1.3 % (ref 0.0–7.0)
HEMATOCRIT: 27.9 % — AB (ref 38.7–49.9)
HGB: 9.7 g/dL — ABNORMAL LOW (ref 13.0–17.1)
LYMPH#: 0.9 10*3/uL (ref 0.9–3.3)
LYMPH%: 15.9 % (ref 14.0–48.0)
MCH: 29.7 pg (ref 28.0–33.4)
MCHC: 34.8 g/dL (ref 32.0–35.9)
MCV: 85 fL (ref 82–98)
MONO#: 0.5 10*3/uL (ref 0.1–0.9)
MONO%: 8.7 % (ref 0.0–13.0)
NEUT#: 4 10*3/uL (ref 1.5–6.5)
NEUT%: 73.2 % (ref 40.0–80.0)
PLATELETS: 167 10*3/uL (ref 145–400)
RBC: 3.27 10*6/uL — ABNORMAL LOW (ref 4.20–5.70)
RDW: 14.9 % (ref 11.1–15.7)
WBC: 5.4 10*3/uL (ref 4.0–10.0)

## 2016-06-30 LAB — FERRITIN: Ferritin: 617 ng/ml — ABNORMAL HIGH (ref 22–316)

## 2016-06-30 LAB — IRON AND TIBC
%SAT: 37 % (ref 20–55)
IRON: 63 ug/dL (ref 42–163)
TIBC: 169 ug/dL — AB (ref 202–409)
UIBC: 106 ug/dL — AB (ref 117–376)

## 2016-06-30 LAB — LACTATE DEHYDROGENASE: LDH: 196 U/L (ref 125–245)

## 2016-06-30 MED ORDER — SODIUM CHLORIDE 0.9 % IV SOLN
2.0000 mg | Freq: Once | INTRAVENOUS | Status: AC
  Administered 2016-06-30: 2 mg via INTRAVENOUS
  Filled 2016-06-30: qty 2

## 2016-06-30 MED ORDER — SODIUM CHLORIDE 0.9 % IV SOLN
Freq: Once | INTRAVENOUS | Status: AC
  Administered 2016-06-30: 11:00:00 via INTRAVENOUS
  Filled 2016-06-30: qty 5

## 2016-06-30 MED ORDER — SODIUM CHLORIDE 0.9 % IV SOLN
Freq: Once | INTRAVENOUS | Status: AC
  Administered 2016-06-30: 11:00:00 via INTRAVENOUS

## 2016-06-30 MED ORDER — SODIUM CHLORIDE 0.9 % IV SOLN
375.0000 mg/m2 | Freq: Once | INTRAVENOUS | Status: AC
  Administered 2016-06-30: 700 mg via INTRAVENOUS
  Filled 2016-06-30: qty 20

## 2016-06-30 MED ORDER — DIPHENHYDRAMINE HCL 25 MG PO CAPS
50.0000 mg | ORAL_CAPSULE | Freq: Once | ORAL | Status: AC
  Administered 2016-06-30: 50 mg via ORAL

## 2016-06-30 MED ORDER — PALONOSETRON HCL INJECTION 0.25 MG/5ML
INTRAVENOUS | Status: AC
  Filled 2016-06-30: qty 5

## 2016-06-30 MED ORDER — DENOSUMAB 120 MG/1.7ML ~~LOC~~ SOLN
120.0000 mg | Freq: Once | SUBCUTANEOUS | Status: AC
  Administered 2016-06-30: 120 mg via SUBCUTANEOUS
  Filled 2016-06-30: qty 1.7

## 2016-06-30 MED ORDER — SODIUM CHLORIDE 0.9% FLUSH
10.0000 mL | INTRAVENOUS | Status: DC | PRN
  Administered 2016-06-30: 10 mL
  Filled 2016-06-30: qty 10

## 2016-06-30 MED ORDER — ACETAMINOPHEN 325 MG PO TABS
650.0000 mg | ORAL_TABLET | Freq: Once | ORAL | Status: AC
  Administered 2016-06-30: 650 mg via ORAL

## 2016-06-30 MED ORDER — HEPARIN SOD (PORK) LOCK FLUSH 100 UNIT/ML IV SOLN
500.0000 [IU] | Freq: Once | INTRAVENOUS | Status: AC | PRN
  Administered 2016-06-30: 500 [IU]
  Filled 2016-06-30: qty 5

## 2016-06-30 MED ORDER — PALONOSETRON HCL INJECTION 0.25 MG/5ML
0.2500 mg | Freq: Once | INTRAVENOUS | Status: AC
  Administered 2016-06-30: 0.25 mg via INTRAVENOUS

## 2016-06-30 MED ORDER — CYCLOPHOSPHAMIDE CHEMO INJECTION 1 GM
675.0000 mg/m2 | Freq: Once | INTRAMUSCULAR | Status: AC
  Administered 2016-06-30: 1260 mg via INTRAVENOUS
  Filled 2016-06-30: qty 63

## 2016-06-30 MED ORDER — ETOPOSIDE CHEMO INJECTION 1 GM/50ML
45.0000 mg/m2 | Freq: Once | INTRAVENOUS | Status: AC
  Administered 2016-06-30: 80 mg via INTRAVENOUS
  Filled 2016-06-30: qty 4

## 2016-06-30 MED ORDER — ACETAMINOPHEN 325 MG PO TABS
ORAL_TABLET | ORAL | Status: AC
Start: 2016-06-30 — End: 2016-06-30
  Filled 2016-06-30: qty 2

## 2016-06-30 MED ORDER — DIPHENHYDRAMINE HCL 25 MG PO CAPS
ORAL_CAPSULE | ORAL | Status: AC
  Filled 2016-06-30: qty 2

## 2016-06-30 NOTE — Progress Notes (Signed)
Hematology and Oncology Follow Up Visit  Gilbert Szalay [id number] 10/30/1950 65 y.o. 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 He is doing fairly well. He had a nice Thanksgiving. I am not sure how much he really was able to eat 8. He just does not take the [name].  Thankfully, this will be his last cycle of treatment. I think she Gerken really has affected him more so than I would've thought. Given that he has the cardiomyopathy, we really couldn't not use Adriamycin. He is in the etoposide I think cause more issues. We've had to transfuse him a couple times. I'm sure that he is still somewhat anemic.  He is not complaining of any pain. He's had no cough. He's had no diarrhea.  He's had no rashes.  I think he saw his cardiologist recently. His cardiac function has been holding steady.  Overall, says performance status is ECOG 1-2.     Medications:    Medication List       Accurate as of 06/30/16  1:51 PM. Always use your most recent med list.          allopurinol 300 MG tablet Commonly known as:  ZYLOPRIM TAKE ONE TABLET BY MOUTH ONCE DAILY   amLODipine 10 MG tablet Commonly known as:  NORVASC Take 10 mg by mouth daily.   aspirin EC 81 MG tablet Take 1 tablet (81 mg total) by mouth daily.   atorvastatin 80 MG tablet Commonly known as:  LIPITOR Take 80 mg by mouth daily.   B-12 5000 MCG Caps Take 5,000 mcg by mouth every morning.   carvedilol 12.5 MG tablet Commonly known as:  COREG Take 12.5 mg by mouth 2 (two) times daily with a meal.   ciprofloxacin 500 MG tablet Commonly known as:  CIPRO Take 1 pill twice a day for 15 days only after each chemotherapy.     clopidogrel 75 MG tablet Commonly known as:  PLAVIX Take 75 mg by mouth daily.   cyclobenzaprine 10 MG tablet Commonly known as:  FLEXERIL Take 1 tablet (10 mg total) by mouth 3 (three) times daily as needed for muscle spasms.   dronabinol 5 MG capsule Commonly known as:  [name] Take 1 capsule (5 mg total) by mouth 2 (two) times daily before a meal.   lidocaine-prilocaine cream Commonly known as:  EMLA Apply 1 application topically daily as needed (port access). Apply to affected area once   LORazepam 0.5 MG tablet Commonly known as:  ATIVAN Take 1 tablet (0.5 mg total) by mouth every 8 (eight) hours.   METAMUCIL PO Take 3 capsules by mouth every evening. Reported on 10/24/2015   ondansetron 8 MG tablet Commonly known as:  ZOFRAN Take 1 tablet (8 mg total) by mouth 2 (two) times daily as needed for refractory nausea / vomiting. Start on day 3 after cyclophosphamide chemotherapy.   pantoprazole 20 MG tablet Commonly known as:  PROTONIX Take 1 tablet (20 mg total) by mouth daily.   PHENYTEK 300 MG ER capsule Generic drug:  phenytoin Take 1 capsule (300 mg total) by mouth every morning.   polyethylene glycol packet Commonly known as:  MIRALAX / GLYCOLAX Take 17  g by mouth daily as needed for moderate constipation.   predniSONE 20 MG tablet Commonly known as:  DELTASONE Take 3 tablets (60 mg total) by mouth daily. Take on days 1-5 of chemotherapy.   prochlorperazine 10 MG tablet Commonly known as:  COMPAZINE Take 1 tablet (10 mg total) by mouth every 6 (six) hours as needed (Nausea or vomiting).   pyridOXINE 100 MG tablet Commonly known as:  VITAMIN B-6 Take 100 mg by mouth every morning.   valsartan 160 MG tablet Commonly known as:  DIOVAN Take 160 mg by mouth every morning. Reported on 11/05/2015   vitamin B-1 250 MG tablet Take 250 mg by mouth every morning.       Allergies:  Allergies  Allergen Reactions  . Oxycodone Swelling and Other (See Comments)     Tongue and lip    Past Medical History, Surgical history, Social history, and Family History were reviewed and updated.  Review of Systems: All other 10 point review of systems is negative.   Physical Exam:  weight is 140 lb (63.5 kg). His oral temperature is 98.1 F (36.7 C). His blood pressure is 157/72 (abnormal) and his pulse is 67. His respiration is 20.   Wt Readings from Last 3 Encounters:  06/30/16 140 lb (63.5 kg)  06/09/16 141 lb 0.6 oz (64 kg)  05/19/16 145 lb 12.8 oz (66.1 kg)    Well-developed and well-nourished white male in no obvious distress. Head and neck exam shows no ocular or oral lesions. No adenopathy noted in the neck. Lungs are clear. Neck exam regular in rhythm with no murmurs, rubs or bruits. Abdomen is soft. Has good bowel sounds. There is no fluid wave. There is no palpable liver or spleen tip. Axillar exam shows no bilateral axillary adenopathy. I did do a rectal exam on him. There is a small external hemorrhoid. There is no masses in the rectal vault. His stool was brown and heme negative Back exam shows no tenderness over the spine, ribs or hips. Extremities shows no clubbing, cyanosis or edema. There Shira be some slight swelling in the right forearm. He has no tenderness over his long bones. He has good range of motion of his joints. Skin exam shows areas of vitiligo. Neurological exam shows no focal deficits.  Lab Results  Component Value Date   WBC 5.4 06/30/2016   HGB 9.7 (L) 06/30/2016   HCT 27.9 (L) 06/30/2016   MCV 85 06/30/2016   PLT 167 06/30/2016   Lab Results  Component Value Date   FERRITIN 617 (H) 06/30/2016   IRON 63 06/30/2016   TIBC 169 (L) 06/30/2016   UIBC 106 (L) 06/30/2016   IRONPCTSAT 37 06/30/2016   Lab Results  Component Value Date   RBC 3.27 (L) 06/30/2016   No results found for: KPAFRELGTCHN, LAMBDASER, KAPLAMBRATIO No results found for: IGGSERUM, IGA, IGMSERUM No results found for: Odetta Pink, SPEI   Chemistry      Component Value Date/Time   NA 142 06/30/2016 0926   K 3.3 06/30/2016 0926   CL 105 06/30/2016 0926   CO2 26 06/30/2016 0926   BUN 10 06/30/2016 0926   CREATININE 0.8 06/30/2016 0926      Component Value Date/Time   CALCIUM 8.6 06/30/2016 0926   ALKPHOS 109 (H) 06/30/2016 0926   AST 23 06/30/2016 0926   ALT 11 06/30/2016 0926   BILITOT 0.40 06/30/2016 0926     Impression and Plan:  Gilbert [name] is a pleasant 65 yo white male with history of clear cell carcinoma Of the right kidney resected back in March 2015. He has now been diagnosed with diffuse large cell non-Hodgkin's lymphoma. Cytogenetics were negative for "double hit" lymphoma.  MUGA showed an EF of 42%. He is now being treated with R-CEOP.  Had a repeat echocardiogram done in October. This showed an ejection fraction of 40-45%.  We will finish up his chemotherapy this week.  I will didn't have to set him up with another PET scan. We will get this done after Christmas.  Hopefully, we'll find that he is in remission.  We probably will have to get him on Xgeva or Zometa to help with his bones.  I don't think that he will need any radiation therapy. Again, we will have to see what the PET scan shows.   I really think that he will start to improve with his weight and overall performance status once we get done with treatment. I told him that it might take a good 6 months or so before he finally feels back to normal. Hopefully, he will start feeling better come Easter and will be able to get back to playing golf and gaining some weight and just feeling like his old self.   I will plan to see him back in about 6 weeks.  He has done a great job with treatment. This is really been tough. I feel bad for him. I know his quality of life has suffered somewhat.   He has a lot of good family support that will help him recover.     Volanda Napoleon, MD  11/29/20171:51 PM

## 2016-06-30 NOTE — Patient Instructions (Addendum)
[facility] Discharge Instructions for Patients Receiving Chemotherapy  Today you received the following chemotherapy agents Rituxan, Vincristine, Cytoxan, Etoposide and Xgeva.  ** DO NOT TAKE ZOFRAN FOR 3 DAYS. Faciane RESUME TAKING ZOFRAN ON Saturday DEC. 2, 2017**  To help prevent nausea and vomiting after your treatment, we encourage you to take your nausea medication.   If you develop nausea and vomiting that is not controlled by your nausea medication, call the clinic.   BELOW ARE SYMPTOMS THAT SHOULD BE REPORTED IMMEDIATELY:  *FEVER GREATER THAN 100.5 F  *CHILLS WITH OR WITHOUT FEVER  NAUSEA AND VOMITING THAT IS NOT CONTROLLED WITH YOUR NAUSEA MEDICATION  *UNUSUAL SHORTNESS OF BREATH  *UNUSUAL BRUISING OR BLEEDING  TENDERNESS IN MOUTH AND THROAT WITH OR WITHOUT PRESENCE OF ULCERS  *URINARY PROBLEMS  *BOWEL PROBLEMS  UNUSUAL RASH Items with * indicate a potential emergency and should be followed up as soon as possible.  Feel free to call the clinic you have any questions or concerns. The clinic phone number is [phone].  Please show the [facility] at check-in to the Emergency Department and triage nurse.

## 2016-06-30 NOTE — Patient Instructions (Signed)

## 2016-07-01 ENCOUNTER — Ambulatory Visit (HOSPITAL_BASED_OUTPATIENT_CLINIC_OR_DEPARTMENT_OTHER): Payer: Medicare Other

## 2016-07-01 VITALS — BP 140/67 | HR 68 | Temp 97.6°F

## 2016-07-01 DIAGNOSIS — C8330 Diffuse large B-cell lymphoma, unspecified site: Secondary | ICD-10-CM

## 2016-07-01 DIAGNOSIS — C8339 Diffuse large B-cell lymphoma, extranodal and solid organ sites: Secondary | ICD-10-CM | POA: Diagnosis not present

## 2016-07-01 DIAGNOSIS — Z5111 Encounter for antineoplastic chemotherapy: Secondary | ICD-10-CM | POA: Diagnosis not present

## 2016-07-01 DIAGNOSIS — C8589 Other specified types of non-Hodgkin lymphoma, extranodal and solid organ sites: Secondary | ICD-10-CM

## 2016-07-01 DIAGNOSIS — C8580 Other specified types of non-Hodgkin lymphoma, unspecified site: Secondary | ICD-10-CM

## 2016-07-01 LAB — RETICULOCYTES: Reticulocyte Count: 1 % (ref 0.6–2.6)

## 2016-07-01 MED ORDER — SODIUM CHLORIDE 0.9% FLUSH
10.0000 mL | INTRAVENOUS | Status: DC | PRN
  Administered 2016-07-01: 10 mL
  Filled 2016-07-01: qty 10

## 2016-07-01 MED ORDER — SODIUM CHLORIDE 0.9 % IV SOLN
Freq: Once | INTRAVENOUS | Status: AC
  Administered 2016-07-01: 09:00:00 via INTRAVENOUS

## 2016-07-01 MED ORDER — HEPARIN SOD (PORK) LOCK FLUSH 100 UNIT/ML IV SOLN
500.0000 [IU] | Freq: Once | INTRAVENOUS | Status: AC | PRN
Start: 2016-07-01 — End: 2016-07-01
  Administered 2016-07-01: 500 [IU]
  Filled 2016-07-01: qty 5

## 2016-07-01 MED ORDER — SODIUM CHLORIDE 0.9 % IV SOLN
45.0000 mg/m2 | Freq: Once | INTRAVENOUS | Status: AC
  Administered 2016-07-01: 80 mg via INTRAVENOUS
  Filled 2016-07-01: qty 4

## 2016-07-01 MED ORDER — PROCHLORPERAZINE MALEATE 10 MG PO TABS
ORAL_TABLET | ORAL | Status: AC
  Filled 2016-07-01: qty 1

## 2016-07-01 MED ORDER — PROCHLORPERAZINE MALEATE 10 MG PO TABS
10.0000 mg | ORAL_TABLET | Freq: Once | ORAL | Status: AC
  Administered 2016-07-01: 10 mg via ORAL

## 2016-07-01 NOTE — Patient Instructions (Signed)
[facility] Discharge Instructions for Patients Receiving Chemotherapy  Today you received the following chemotherapy agents: Etoposide   To help prevent nausea and vomiting after your treatment, we encourage you to take your nausea medication as directed.    If you develop nausea and vomiting that is not controlled by your nausea medication, call the clinic.   BELOW ARE SYMPTOMS THAT SHOULD BE REPORTED IMMEDIATELY:  *FEVER GREATER THAN 100.5 F  *CHILLS WITH OR WITHOUT FEVER  NAUSEA AND VOMITING THAT IS NOT CONTROLLED WITH YOUR NAUSEA MEDICATION  *UNUSUAL SHORTNESS OF BREATH  *UNUSUAL BRUISING OR BLEEDING  TENDERNESS IN MOUTH AND THROAT WITH OR WITHOUT PRESENCE OF ULCERS  *URINARY PROBLEMS  *BOWEL PROBLEMS  UNUSUAL RASH Items with * indicate a potential emergency and should be followed up as soon as possible.  Feel free to call the clinic you have any questions or concerns. The clinic phone number is [phone].  Please show the CHEMO ALERT CARD at check-in to the Emergency Department and triage nurse.   

## 2016-07-02 ENCOUNTER — Ambulatory Visit (HOSPITAL_BASED_OUTPATIENT_CLINIC_OR_DEPARTMENT_OTHER): Payer: Medicare Other

## 2016-07-02 VITALS — BP 148/71 | HR 64 | Temp 97.7°F

## 2016-07-02 DIAGNOSIS — Z5111 Encounter for antineoplastic chemotherapy: Secondary | ICD-10-CM

## 2016-07-02 DIAGNOSIS — C8339 Diffuse large B-cell lymphoma, extranodal and solid organ sites: Secondary | ICD-10-CM

## 2016-07-02 DIAGNOSIS — C8330 Diffuse large B-cell lymphoma, unspecified site: Secondary | ICD-10-CM

## 2016-07-02 DIAGNOSIS — C8580 Other specified types of non-Hodgkin lymphoma, unspecified site: Secondary | ICD-10-CM

## 2016-07-02 DIAGNOSIS — C8589 Other specified types of non-Hodgkin lymphoma, extranodal and solid organ sites: Secondary | ICD-10-CM

## 2016-07-02 MED ORDER — PROCHLORPERAZINE MALEATE 10 MG PO TABS
10.0000 mg | ORAL_TABLET | Freq: Once | ORAL | Status: AC
  Administered 2016-07-02: 10 mg via ORAL

## 2016-07-02 MED ORDER — PROCHLORPERAZINE MALEATE 10 MG PO TABS
ORAL_TABLET | ORAL | Status: AC
  Filled 2016-07-02: qty 1

## 2016-07-02 MED ORDER — HEPARIN SOD (PORK) LOCK FLUSH 100 UNIT/ML IV SOLN
500.0000 [IU] | Freq: Once | INTRAVENOUS | Status: AC | PRN
  Administered 2016-07-02: 500 [IU]
  Filled 2016-07-02: qty 5

## 2016-07-02 MED ORDER — SODIUM CHLORIDE 0.9 % IV SOLN
Freq: Once | INTRAVENOUS | Status: AC
  Administered 2016-07-02: 12:00:00 via INTRAVENOUS

## 2016-07-02 MED ORDER — SODIUM CHLORIDE 0.9 % IV SOLN
45.0000 mg/m2 | Freq: Once | INTRAVENOUS | Status: AC
  Administered 2016-07-02: 80 mg via INTRAVENOUS
  Filled 2016-07-02: qty 4

## 2016-07-02 MED ORDER — SODIUM CHLORIDE 0.9% FLUSH
10.0000 mL | INTRAVENOUS | Status: DC | PRN
  Administered 2016-07-02: 10 mL
  Filled 2016-07-02: qty 10

## 2016-07-02 NOTE — Patient Instructions (Signed)
[facility] Discharge Instructions for Patients Receiving Chemotherapy  Today you received the following chemotherapy agents: Etoposide   To help prevent nausea and vomiting after your treatment, we encourage you to take your nausea medication as directed.    If you develop nausea and vomiting that is not controlled by your nausea medication, call the clinic.   BELOW ARE SYMPTOMS THAT SHOULD BE REPORTED IMMEDIATELY:  *FEVER GREATER THAN 100.5 F  *CHILLS WITH OR WITHOUT FEVER  NAUSEA AND VOMITING THAT IS NOT CONTROLLED WITH YOUR NAUSEA MEDICATION  *UNUSUAL SHORTNESS OF BREATH  *UNUSUAL BRUISING OR BLEEDING  TENDERNESS IN MOUTH AND THROAT WITH OR WITHOUT PRESENCE OF ULCERS  *URINARY PROBLEMS  *BOWEL PROBLEMS  UNUSUAL RASH Items with * indicate a potential emergency and should be followed up as soon as possible.  Feel free to call the clinic you have any questions or concerns. The clinic phone number is [phone].  Please show the CHEMO ALERT CARD at check-in to the Emergency Department and triage nurse.   

## 2016-07-14 ENCOUNTER — Encounter: Payer: Self-pay | Admitting: Emergency Medicine

## 2016-07-14 DIAGNOSIS — R0989 Other specified symptoms and signs involving the circulatory and respiratory systems: Secondary | ICD-10-CM | POA: Diagnosis not present

## 2016-07-22 ENCOUNTER — Other Ambulatory Visit: Payer: Self-pay | Admitting: Emergency Medicine

## 2016-07-29 ENCOUNTER — Telehealth: Payer: Self-pay

## 2016-07-29 ENCOUNTER — Encounter (HOSPITAL_COMMUNITY)
Admission: RE | Admit: 2016-07-29 | Discharge: 2016-07-29 | Disposition: A | Discharge status: Home / Self Care | Payer: Medicare Other | Source: Ambulatory Visit | Attending: Hematology & Oncology | Admitting: Hematology & Oncology

## 2016-07-29 DIAGNOSIS — C8580 Other specified types of non-Hodgkin lymphoma, unspecified site: Secondary | ICD-10-CM | POA: Insufficient documentation

## 2016-07-29 DIAGNOSIS — C8330 Diffuse large B-cell lymphoma, unspecified site: Secondary | ICD-10-CM

## 2016-07-29 DIAGNOSIS — C8590 Non-Hodgkin lymphoma, unspecified, unspecified site: Secondary | ICD-10-CM | POA: Diagnosis not present

## 2016-07-29 LAB — GLUCOSE, CAPILLARY: GLUCOSE-CAPILLARY: 94 mg/dL (ref 65–99)

## 2016-07-29 MED ORDER — FLUDEOXYGLUCOSE F - 18 (FDG) INJECTION: 6.4000 | Freq: Once | INTRAVENOUS | Status: DC | PRN

## 2016-07-29 NOTE — Telephone Encounter (Addendum)
-----   Message from Volanda Napoleon, MD sent at 07/29/2016  1:46 PM EST ----- Call - NO active lymphoma!!!  You are in remission.!!  Happy New Year!!  Laurey Arrow   Above message given to pt's daughter Leafy Ro via phone. dph

## 2016-07-30 ENCOUNTER — Other Ambulatory Visit: Payer: Self-pay | Admitting: Emergency Medicine

## 2016-08-03 ENCOUNTER — Ambulatory Visit (INDEPENDENT_AMBULATORY_CARE_PROVIDER_SITE_OTHER): Payer: Medicare Other | Admitting: Family Medicine

## 2016-08-03 VITALS — BP 132/58 | HR 64 | Temp 97.9°F | Resp 16 | Ht 68.5 in | Wt 137.0 lb

## 2016-08-03 DIAGNOSIS — G40909 Epilepsy, unspecified, not intractable, without status epilepticus: Secondary | ICD-10-CM

## 2016-08-03 MED ORDER — PHENYTOIN SODIUM EXTENDED 300 MG PO CAPS: 300.0000 mg | ORAL_CAPSULE | Freq: Every morning | ORAL | 1 refills | Status: DC

## 2016-08-03 NOTE — Patient Instructions (Addendum)
  Dilantin refilled at the same level, I will check your Dilantin levels today. Depending on those results, can discuss if medication changes needed, but since you have not had any seizures recently, will continue the same dose for now.  Some of the monitoring tests were already done by her oncologist, would recommend dental care provider evaluate gums as Dilantin can affect those as well.  Follow-up in the next 6 months for a physical, sooner if needed for other med refills.   IF you received an x-ray today, you will receive an invoice from [facility]. Please contact [facility] at [phone] with questions or concerns regarding your invoice.   IF you received labwork today, you will receive an invoice from [facility]. Please contact [facility] at [phone] with questions or concerns regarding your invoice.   Our billing staff will not be able to assist you with questions regarding bills from these companies.  You will be contacted with the lab results as soon as they are available. The fastest way to get your results is to activate your My Chart account. Instructions are located on the last page of this paperwork. If you have not heard from [place] regarding the results in 2 weeks, please contact this office.

## 2016-08-03 NOTE — Progress Notes (Signed)
Subjective:  By signing my name below, I, Moises Blood, attest that this documentation has been prepared under the direction and in the presence of [name], MD. Electronically Signed: Moises Blood, [place]. 08/03/2016 , 2:14 PM .  Patient was seen in Room 12 .   Patient ID: Gilbert [name], male    DOB: 06-Sep-1950, 66 y.o.   MRN: [id number] Chief Complaint  Patient presents with  . Medication Refill    Phenytoin EX 300mg    HPI Gilbert [name] is a 66 y.o. male Here for medication refill of his phenytoin ER and establish care. He is a previous patient of Dr. Perfecto Kingdom. He has a history of multiple medical problems including non-Hodgkin's lymphoma renal cancer, seizure disorder, HTN, cardiomyopathy, CAD, and PAD. He is followed by Dr. Marin Olp, last office visit on Nov 29th, 2017. His last chemotherapy infusion was on Dec 1st, and noted to be in remission on Dec 28th. He is followed by cardiologist, Dr. Einar Gip, last seen 2 weeks ago.   Here today for refill of dilantin. This was last discussed in February 2017. His dilantin level at that time was 4.7, but as he had no seizures, dosage was not changed. He believes his last seizure was about 10-15 years ago. He denies having any side effects with the dilantin.   Lab Results  Component Value Date   WBC 5.4 06/30/2016   HGB 9.7 (L) 06/30/2016   HCT 27.9 (L) 06/30/2016   MCV 85 06/30/2016   PLT 167 06/30/2016   Lab Results  Component Value Date   CREATININE 0.8 06/30/2016   Lab Results  Component Value Date   ALT 11 06/30/2016   AST 23 06/30/2016   ALKPHOS 109 (H) 06/30/2016   BILITOT 0.40 06/30/2016   He hasn't seen a dentist recently.   Patient Active Problem List   Diagnosis Date Noted  . Diffuse non-Hodgkin's lymphoma of bone ([place]) 01/08/2016  . 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 Hematuria 10/29/2015  . Dysphagia, pharyngoesophageal phase   . Early satiety     . Esophageal stricture   . Gastritis and gastroduodenitis   . PAD (peripheral artery disease) ([place]) 05/28/2014  . Cardiomyopathy, ischemic 04/03/2014  . History of left heart catheterization (LHC) 02/26/2014  . Arterial bleed, intraoperative 02/26/2014  . Seizures ([place]) 11/27/2013  . HTN (hypertension) 11/27/2013  . Hypomagnesemia 11/27/2013  . CAD (coronary artery disease), native coronary artery 11/26/2013  . Renal cancer ([place]) 10/19/2013  . Coronary artery calcification 09/25/2013  . Renal cyst 08/30/2013  . Loss of weight 08/03/2013   Past Medical History:  Diagnosis Date  . Abnormal liver enzymes    Chronic alkaline phosphatase elevation since 2014  . Acid reflux   . CAD (coronary artery disease)   . Cancer ([place]) 08/2013   right kidney cancer  . Cancer ([place])   . Coronary artery calcification seen on CAT scan   . Coronary atherosclerosis of native coronary artery   . 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 Diffuse non-Hodgkin's lymphoma of bone ([place]) 01/08/2016  . Essential hypertension, benign   . Family history of anesthesia complication    mother-had stroke during anesthesia  . GERD (gastroesophageal reflux disease)   . Gout   . History of cardiac arrest 11/26/13   PTCA/Stenting of LM & Prox LAD with 4.0 x 18 mm Xience alpine stent. Used Impella Circulatory assist device. EF= 20-25%  . History of epilepsy  at least 10 years ago. 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 History of myocardial infarction less than 8 weeks    Stent placed  . History of nephrectomy 11/2013   For renal cell carcinoma  . History of tobacco use   . HTN (hypertension)   . Hypercholesteremia   . Hyperlipemia   . Hypertension   . PAD (peripheral artery disease) ([place])   . Seizures ([place])   . Shortness of breath    with activity  . SOB (shortness of breath)   . Systolic and diastolic CHF, chronic (HCC)    Resolved. EF 45% 04/10/2014  . Therapeutic drug monitoring    Past  Surgical History:  Procedure Laterality Date  . BALLOON DILATION N/A 01/21/2015   Procedure: BALLOON DILATION;  Surgeon: Gatha Mayer, MD;  Location[facility] ENDOSCOPY;  Service: Endoscopy;  Laterality: N/A;  . CARDIAC CATHETERIZATION N/A 06/10/2015   Procedure: Left Heart Cath and Coronary Angiography;  Surgeon: Adrian Prows, MD;  Location: [facility] CV LAB;  Service: Cardiovascular;  Laterality: N/A;  . CORONARY ANGIOPLASTY WITH STENT PLACEMENT  2015   Left main coronary artery stenting on emergent basis along with circulatory support with Impella device through left leg. With 4.0 x 18 mm Xience alpine stent  . ESOPHAGOGASTRODUODENOSCOPY    . ESOPHAGOGASTRODUODENOSCOPY N/A 01/21/2015   Procedure: ESOPHAGOGASTRODUODENOSCOPY (EGD) with possible dilation.;  Surgeon: Gatha Mayer, MD;  Location: [facility] ENDOSCOPY;  Service: Endoscopy;  Laterality: N/A;  . EYE SURGERY Left 2 weeks ago   torn retina  . ILIAC ARTERY STENT Left 05/28/2014   dr Einar Gip  . LEFT HEART CATHETERIZATION WITH CORONARY ANGIOGRAM N/A 11/27/2013   Procedure: LEFT HEART CATHETERIZATION WITH CORONARY ANGIOGRAM;  Surgeon: [name], MD;  Location: [facility] CATH LAB;  Service: Cardiovascular;  Laterality: N/A;  . LOWER EXTREMITY ANGIOGRAM N/A 02/26/2014   Procedure: LOWER EXTREMITY ANGIOGRAM;  Surgeon: [name], MD;  Location: [facility] CATH LAB;  Service: Cardiovascular;  Laterality: N/A;  . LOWER EXTREMITY ANGIOGRAM N/A 05/28/2014   Procedure: LOWER EXTREMITY ANGIOGRAM;  Surgeon: [name], MD;  Location: [facility] CATH LAB;  Service: Cardiovascular;  Laterality: N/A;  . NEPHRECTOMY  11/2013  . NO PAST SURGERIES    . PERCUTANEOUS CORONARY STENT INTERVENTION (PCI-S)  11/27/2013   Procedure: PERCUTANEOUS CORONARY STENT INTERVENTION (PCI-S);  Surgeon: [name], MD;  Location: [facility] CATH LAB;  Service: Cardiovascular;;  . ROBOT ASSISTED LAPAROSCOPIC NEPHRECTOMY Right 10/19/2013   Procedure: ROBOTIC ASSISTED LAPAROSCOPIC NEPHRECTOMY,   EXTENSIVE ADHESIOLYSIS;  Surgeon: Alexis Frock, MD;  Location: [facility] ORS;  Service: Urology;  Laterality: Right;   Allergies  Allergen Reactions  . Oxycodone Swelling and Other (See Comments)    Tongue and lip   Prior to Admission medications   Medication Sig Start Date End Date Taking? Authorizing Provider  allopurinol (ZYLOPRIM) 300 MG tablet TAKE ONE TABLET BY MOUTH ONCE DAILY 06/02/16   Volanda Napoleon, MD  amLODipine (NORVASC) 10 MG tablet Take 10 mg by mouth daily.    Historical Provider, MD  aspirin EC 81 MG tablet Take 1 tablet (81 mg total) by mouth daily. Patient taking differently: Take 81 mg by mouth every morning.  12/11/13   Lavon Paganini Angiulli, PA-C  atorvastatin (LIPITOR) 80 MG tablet Take 80 mg by mouth daily. 09/05/15   Historical Provider, MD  carvedilol (COREG) 12.5 MG tablet Take 12.5 mg by mouth 2 (two) times daily with a meal.  11/13/14   Historical Provider, MD  ciprofloxacin (  CIPRO) 500 MG tablet Take 1 pill twice a day for 15 days only after each chemotherapy. 03/24/16   Volanda Napoleon, MD  clopidogrel (PLAVIX) 75 MG tablet Take 75 mg by mouth daily.  10/31/15   Historical Provider, MD  Cyanocobalamin (B-12) 5000 MCG CAPS Take 5,000 mcg by mouth every morning.     Historical Provider, MD  cyclobenzaprine (FLEXERIL) 10 MG tablet Take 1 tablet (10 mg total) by mouth 3 (three) times daily as needed for muscle spasms. 10/23/15   Veryl Speak, MD  dronabinol ([name]) 5 MG capsule Take 1 capsule (5 mg total) by mouth 2 (two) times daily before a meal. 03/29/16   Volanda Napoleon, MD  lidocaine-prilocaine (EMLA) cream Apply 1 application topically daily as needed (port access). Apply to affected area once 03/24/16   Volanda Napoleon, MD  LORazepam (ATIVAN) 0.5 MG tablet Take 1 tablet (0.5 mg total) by mouth every 8 (eight) hours. Patient taking differently: Take 0.5 mg by mouth every 8 (eight) hours as needed for anxiety (nausea).  02/11/16   Gilbert Bottom, NP  ondansetron (ZOFRAN) 8  MG tablet Take 1 tablet (8 mg total) by mouth 2 (two) times daily as needed for refractory nausea / vomiting. Start on day 3 after cyclophosphamide chemotherapy. 01/22/16   Volanda Napoleon, MD  pantoprazole (PROTONIX) 20 MG tablet Take 1 tablet (20 mg total) by mouth daily. 02/19/16   Lacretia Leigh, MD  phenytoin (DILANTIN) 300 MG ER capsule TAKE ONE CAPSULE BY MOUTH IN THE MORNING 07/30/16   [name], MD  polyethylene glycol (MIRALAX / GLYCOLAX) packet Take 17 g by mouth daily as needed for moderate constipation.     Historical Provider, MD  predniSONE (DELTASONE) 20 MG tablet Take 3 tablets (60 mg total) by mouth daily. Take on days 1-5 of chemotherapy. 02/12/16   Gilbert Bottom, NP  prochlorperazine (COMPAZINE) 10 MG tablet Take 1 tablet (10 mg total) by mouth every 6 (six) hours as needed (Nausea or vomiting). 01/14/16   Volanda Napoleon, MD  Psyllium (METAMUCIL PO) Take 3 capsules by mouth every evening. Reported on 10/24/2015    Historical Provider, MD  pyridOXINE (VITAMIN B-6) 100 MG tablet Take 100 mg by mouth every morning.     Historical Provider, MD  Thiamine HCl (VITAMIN B-1) 250 MG tablet Take 250 mg by mouth every morning.     Historical Provider, MD  valsartan (DIOVAN) 160 MG tablet Take 160 mg by mouth every morning. Reported on 11/05/2015 11/08/14   Historical Provider, MD   Social History   Social History  . Marital status: Single    Spouse name: N/A  . Number of children: N/A  . Years of education: N/A   Occupational History  . Not on file.   Social History Main Topics  . Smoking status: Former Smoker    Packs/day: 2.00    Years: 45.00    Types: Cigarettes    Quit date: 08/03/2003  . Smokeless tobacco: Current User    Types: Chew     Comment: quit  smoking 10 years agop  . Alcohol use No     Comment: quit 1980  . Drug use: No     Comment: none in past 30 years   . Sexual activity: Not on file   Other Topics Concern  . Not on file   Social History Narrative    ** Merged History Encounter **       Review of  Systems  Constitutional: Negative for fatigue and unexpected weight change.  Eyes: Negative for visual disturbance.  Respiratory: Negative for cough, chest tightness and shortness of breath.   Cardiovascular: Negative for chest pain, palpitations and leg swelling.  Gastrointestinal: Negative for abdominal pain and blood in stool.  Neurological: Negative for dizziness, light-headedness and headaches.       Objective:   Physical Exam  Constitutional: He is oriented to person, place, and time. He appears well-developed and well-nourished.  HENT:  Head: Normocephalic and atraumatic.  No apparent gum hyperplasia  Eyes: EOM are normal. Pupils are equal, round, and reactive to light.  Neck: No JVD present. Carotid bruit is not present.  Cardiovascular: Normal rate, regular rhythm and normal heart sounds.   No murmur heard. Pulmonary/Chest: Effort normal and breath sounds normal. He has no rales.  Musculoskeletal: He exhibits no edema.  Neurological: He is alert and oriented to person, place, and time.  Skin: Skin is warm and dry.  Psychiatric: He has a normal mood and affect.  Vitals reviewed.   Vitals:   08/03/16 1332 08/03/16 1355  BP: (!) 142/60 (!) 132/58  Pulse: 64   Resp: 16   Temp: 97.9 F (36.6 C)   SpO2: 100%   Weight: 137 lb (62.1 kg)   Height: 5' 8.5" (1.74 m)       Assessment & Plan:   Gilbert [name] is a 66 y.o. male Seizure disorder ([place]) - Plan: Phenytoin level, free and total, phenytoin (DILANTIN) 300 MG ER capsule, DISCONTINUED: phenytoin (DILANTIN) 300 MG ER capsule  - Refilled Dilantin same dose for now. Decided to remain on same dose at previous visit as no seizure history recently, even with lower level. Depending on current Dilantin level, [place] need to discuss with neurology. Recommended dental care provider follow-up with use of Dilantin and schedule physical in the next 6 months.   Meds ordered this  encounter  Medications  . DISCONTD: phenytoin (DILANTIN) 300 MG ER capsule    Sig: Take 1 capsule (300 mg total) by mouth every morning.    Dispense:  90 capsule    Refill:  1    Office visit needed for refills  . phenytoin (DILANTIN) 300 MG ER capsule    Sig: Take 1 capsule (300 mg total) by mouth every morning.    Dispense:  90 capsule    Refill:  1   Patient Instructions    Dilantin refilled at the same level, I will check your Dilantin levels today. Depending on those results, can discuss if medication changes needed, but since you have not had any seizures recently, will continue the same dose for now.  Some of the monitoring tests were already done by her oncologist, would recommend dental care provider evaluate gums as Dilantin can affect those as well.  Follow-up in the next 6 months for a physical, sooner if needed for other med refills.   IF you received an x-ray today, you will receive an invoice from [facility] Radiology. Please contact [facility] Radiology at [phone] with questions or concerns regarding your invoice.   IF you received labwork today, you will receive an invoice from [facility]. Please contact [facility] at [phone] with questions or concerns regarding your invoice.   Our billing staff will not be able to assist you with questions regarding bills from these companies.  You will be contacted with the lab results as soon as they are available. The fastest way to get your results is to activate your My  Chart account. Instructions are located on the last page of this paperwork. If you have not heard from [place] regarding the results in 2 weeks, please contact this office.        I personally performed the services described in this documentation, which was scribed in my presence. The recorded information has been reviewed and considered, and addended by me as needed.   Signed,   [name], MD [facility].    08/05/16 12:36 PM

## 2016-08-05 ENCOUNTER — Encounter: Payer: Self-pay | Admitting: Family Medicine

## 2016-08-09 DIAGNOSIS — H31092 Other chorioretinal scars, left eye: Secondary | ICD-10-CM | POA: Diagnosis not present

## 2016-08-09 DIAGNOSIS — H34211 Partial retinal artery occlusion, right eye: Secondary | ICD-10-CM | POA: Diagnosis not present

## 2016-08-09 DIAGNOSIS — H2513 Age-related nuclear cataract, bilateral: Secondary | ICD-10-CM | POA: Diagnosis not present

## 2016-08-09 DIAGNOSIS — H1131 Conjunctival hemorrhage, right eye: Secondary | ICD-10-CM | POA: Diagnosis not present

## 2016-08-11 LAB — PHENYTOIN LEVEL, FREE AND TOTAL
PHENYTOIN FREE: 0.8 ug/mL — AB (ref 1.0–2.0)
Phenytoin, Serum: 7.2 ug/mL — ABNORMAL LOW (ref 10.0–20.0)

## 2016-08-17 DIAGNOSIS — Z905 Acquired absence of kidney: Secondary | ICD-10-CM | POA: Diagnosis not present

## 2016-08-17 DIAGNOSIS — I2510 Atherosclerotic heart disease of native coronary artery without angina pectoris: Secondary | ICD-10-CM | POA: Diagnosis not present

## 2016-08-17 DIAGNOSIS — H34211 Partial retinal artery occlusion, right eye: Secondary | ICD-10-CM | POA: Diagnosis not present

## 2016-08-17 DIAGNOSIS — I255 Ischemic cardiomyopathy: Secondary | ICD-10-CM | POA: Diagnosis not present

## 2016-08-17 DIAGNOSIS — H3411 Central retinal artery occlusion, right eye: Secondary | ICD-10-CM | POA: Diagnosis not present

## 2016-08-20 ENCOUNTER — Telehealth: Payer: Self-pay | Admitting: Emergency Medicine

## 2016-08-20 NOTE — Telephone Encounter (Signed)
-----   Message from Wendie Agreste, MD sent at 08/17/2016  8:49 PM EST ----- Please call patient and verify he does have his Dilantin. I received a note from his cardiologist asking that the Dilantin be refilled, but I sent that in 2 weeks ago. Make sure that the pharmacy did receive that prescription. Thanks. -JG

## 2016-08-20 NOTE — Telephone Encounter (Signed)
Pt would like Sharee Pimple to know his script is at the pharmacy. He will pick it up as soon as weather clears.

## 2016-09-02 DIAGNOSIS — H2511 Age-related nuclear cataract, right eye: Secondary | ICD-10-CM | POA: Diagnosis not present

## 2016-11-05 ENCOUNTER — Other Ambulatory Visit: Payer: Self-pay | Admitting: Family

## 2016-11-05 ENCOUNTER — Ambulatory Visit (HOSPITAL_BASED_OUTPATIENT_CLINIC_OR_DEPARTMENT_OTHER): Payer: Medicare Other

## 2016-11-05 VITALS — BP 145/70 | HR 56 | Temp 98.0°F | Resp 20

## 2016-11-05 DIAGNOSIS — Z452 Encounter for adjustment and management of vascular access device: Secondary | ICD-10-CM

## 2016-11-05 DIAGNOSIS — C8339 Diffuse large B-cell lymphoma, extranodal and solid organ sites: Secondary | ICD-10-CM

## 2016-11-05 DIAGNOSIS — Z95828 Presence of other vascular implants and grafts: Secondary | ICD-10-CM

## 2016-11-05 MED ORDER — HEPARIN SOD (PORK) LOCK FLUSH 100 UNIT/ML IV SOLN
500.0000 [IU] | Freq: Once | INTRAVENOUS | Status: AC
  Administered 2016-11-05: 500 [IU] via INTRAVENOUS
  Filled 2016-11-05: qty 5

## 2016-11-05 MED ORDER — SODIUM CHLORIDE 0.9% FLUSH
10.0000 mL | INTRAVENOUS | Status: DC | PRN
  Administered 2016-11-05: 10 mL via INTRAVENOUS
  Filled 2016-11-05: qty 10

## 2016-12-23 DIAGNOSIS — I10 Essential (primary) hypertension: Secondary | ICD-10-CM | POA: Diagnosis not present

## 2016-12-23 DIAGNOSIS — N183 Chronic kidney disease, stage 3 (moderate): Secondary | ICD-10-CM | POA: Diagnosis not present

## 2016-12-23 DIAGNOSIS — R809 Proteinuria, unspecified: Secondary | ICD-10-CM | POA: Diagnosis not present

## 2016-12-29 ENCOUNTER — Telehealth: Payer: Self-pay | Admitting: Family Medicine

## 2016-12-29 DIAGNOSIS — N183 Chronic kidney disease, stage 3 (moderate): Secondary | ICD-10-CM | POA: Diagnosis not present

## 2016-12-29 NOTE — Telephone Encounter (Signed)
LMOM FOR PT TO CALL AND RESCHEDULE APPOINTMENT THAT WAS MADE FOR 02-03-17 FOR A 6 MONTH F/U [place] WILL BE OUT OF THE OFFICE THAT DAY

## 2017-01-03 DIAGNOSIS — R809 Proteinuria, unspecified: Secondary | ICD-10-CM | POA: Diagnosis not present

## 2017-01-03 DIAGNOSIS — N183 Chronic kidney disease, stage 3 (moderate): Secondary | ICD-10-CM | POA: Diagnosis not present

## 2017-01-03 DIAGNOSIS — I10 Essential (primary) hypertension: Secondary | ICD-10-CM | POA: Diagnosis not present

## 2017-01-06 ENCOUNTER — Ambulatory Visit (HOSPITAL_BASED_OUTPATIENT_CLINIC_OR_DEPARTMENT_OTHER): Payer: Medicare Other

## 2017-01-06 ENCOUNTER — Ambulatory Visit (HOSPITAL_BASED_OUTPATIENT_CLINIC_OR_DEPARTMENT_OTHER): Payer: Medicare Other | Admitting: Hematology & Oncology

## 2017-01-06 ENCOUNTER — Other Ambulatory Visit (HOSPITAL_BASED_OUTPATIENT_CLINIC_OR_DEPARTMENT_OTHER): Payer: Medicare Other

## 2017-01-06 ENCOUNTER — Other Ambulatory Visit: Payer: Self-pay

## 2017-01-06 VITALS — BP 131/58 | HR 58 | Temp 98.3°F | Resp 18 | Wt 136.0 lb

## 2017-01-06 DIAGNOSIS — C8589 Other specified types of non-Hodgkin lymphoma, extranodal and solid organ sites: Secondary | ICD-10-CM

## 2017-01-06 DIAGNOSIS — C8339 Diffuse large B-cell lymphoma, extranodal and solid organ sites: Secondary | ICD-10-CM

## 2017-01-06 DIAGNOSIS — C8330 Diffuse large B-cell lymphoma, unspecified site: Secondary | ICD-10-CM

## 2017-01-06 DIAGNOSIS — C8580 Other specified types of non-Hodgkin lymphoma, unspecified site: Secondary | ICD-10-CM

## 2017-01-06 DIAGNOSIS — Z95828 Presence of other vascular implants and grafts: Secondary | ICD-10-CM

## 2017-01-06 DIAGNOSIS — M818 Other osteoporosis without current pathological fracture: Secondary | ICD-10-CM

## 2017-01-06 DIAGNOSIS — T387X5A Adverse effect of androgens and anabolic congeners, initial encounter: Secondary | ICD-10-CM

## 2017-01-06 LAB — CBC WITH DIFFERENTIAL (CANCER CENTER ONLY)
BASO#: 0 10*3/uL (ref 0.0–0.2)
BASO%: 0.4 % (ref 0.0–2.0)
EOS ABS: 0.7 10*3/uL — AB (ref 0.0–0.5)
EOS%: 10.5 % — AB (ref 0.0–7.0)
HCT: 33.2 % — ABNORMAL LOW (ref 38.7–49.9)
HEMOGLOBIN: 11.7 g/dL — AB (ref 13.0–17.1)
LYMPH#: 1.1 10*3/uL (ref 0.9–3.3)
LYMPH%: 15.9 % (ref 14.0–48.0)
MCH: 30.9 pg (ref 28.0–33.4)
MCHC: 35.2 g/dL (ref 32.0–35.9)
MCV: 88 fL (ref 82–98)
MONO#: 0.5 10*3/uL (ref 0.1–0.9)
MONO%: 7.8 % (ref 0.0–13.0)
NEUT%: 65.4 % (ref 40.0–80.0)
NEUTROS ABS: 4.5 10*3/uL (ref 1.5–6.5)
Platelets: 125 10*3/uL — ABNORMAL LOW (ref 145–400)
RBC: 3.79 10*6/uL — AB (ref 4.20–5.70)
RDW: 13.2 % (ref 11.1–15.7)
WBC: 6.9 10*3/uL (ref 4.0–10.0)

## 2017-01-06 LAB — CMP (CANCER CENTER ONLY)
ALT(SGPT): 17 U/L (ref 10–47)
AST: 23 U/L (ref 11–38)
Albumin: 3.1 g/dL — ABNORMAL LOW (ref 3.3–5.5)
Alkaline Phosphatase: 90 U/L — ABNORMAL HIGH (ref 26–84)
BILIRUBIN TOTAL: 0.7 mg/dL (ref 0.20–1.60)
BUN, Bld: 28 mg/dL — ABNORMAL HIGH (ref 7–22)
CALCIUM: 9.8 mg/dL (ref 8.0–10.3)
CO2: 26 meq/L (ref 18–33)
Chloride: 107 mEq/L (ref 98–108)
Creat: 1.4 mg/dl — ABNORMAL HIGH (ref 0.6–1.2)
Glucose, Bld: 82 mg/dL (ref 73–118)
Potassium: 3.3 mEq/L (ref 3.3–4.7)
Sodium: 138 mEq/L (ref 128–145)
TOTAL PROTEIN: 6.2 g/dL — AB (ref 6.4–8.1)

## 2017-01-06 MED ORDER — SODIUM CHLORIDE 0.9% FLUSH
10.0000 mL | INTRAVENOUS | Status: DC | PRN
  Administered 2017-01-06: 10 mL via INTRAVENOUS
  Filled 2017-01-06: qty 10

## 2017-01-06 MED ORDER — LIDOCAINE-PRILOCAINE 2.5-2.5 % EX CREA: 1.0000 "application " | TOPICAL_CREAM | Freq: Every day | CUTANEOUS | 5 refills | Status: DC | PRN

## 2017-01-06 MED ORDER — HEPARIN SOD (PORK) LOCK FLUSH 100 UNIT/ML IV SOLN
500.0000 [IU] | Freq: Once | INTRAVENOUS | Status: AC
  Administered 2017-01-06: 500 [IU] via INTRAVENOUS
  Filled 2017-01-06: qty 5

## 2017-01-06 MED ORDER — DENOSUMAB 120 MG/1.7ML ~~LOC~~ SOLN
120.0000 mg | Freq: Once | SUBCUTANEOUS | Status: AC
  Administered 2017-01-06: 120 mg via SUBCUTANEOUS
  Filled 2017-01-06: qty 1.7

## 2017-01-06 MED FILL — LIDOCAINE-PRILOCAINE CREAM: 2.5-2.5 | 30 days supply | Qty: 30 | Fill #0

## 2017-01-06 NOTE — Patient Instructions (Signed)
Denosumab injection What is this medicine? DENOSUMAB (den oh sue mab) slows bone breakdown. Prolia is used to treat osteoporosis in women after menopause and in men. Delton See is used to treat a high calcium level due to cancer and to prevent bone fractures and other bone problems caused by multiple myeloma or cancer bone metastases. Delton See is also used to treat giant cell tumor of the bone. This medicine [place] be used for other purposes; ask your health care provider or pharmacist if you have questions. COMMON BRAND NAME(S): Prolia, XGEVA What should I tell my health care provider before I take this medicine? They need to know if you have any of these conditions: -dental disease -having surgery or tooth extraction -infection -kidney disease -low levels of calcium or Vitamin D in the blood -malnutrition -on hemodialysis -skin conditions or sensitivity -thyroid or parathyroid disease -an unusual reaction to denosumab, other medicines, foods, dyes, or preservatives -pregnant or trying to get pregnant -breast-feeding How should I use this medicine? This medicine is for injection under the skin. It is given by a health care professional in a hospital or clinic setting. If you are getting Prolia, a special MedGuide will be given to you by the pharmacist with each prescription and refill. Be sure to read this information carefully each time. For Prolia, talk to your pediatrician regarding the use of this medicine in children. Special care [place] be needed. For Delton See, talk to your pediatrician regarding the use of this medicine in children. While this drug Krider be prescribed for children as young as 13 years for selected conditions, precautions do apply. Overdosage: If you think you have taken too much of this medicine contact a poison control center or emergency room at once. NOTE: This medicine is only for you. Do not share this medicine with others. What if I miss a dose? It is important not to miss your  dose. Call your doctor or health care professional if you are unable to keep an appointment. What Brucks interact with this medicine? Do not take this medicine with any of the following medications: -other medicines containing denosumab This medicine Ozdemir also interact with the following medications: -medicines that lower your chance of fighting infection -steroid medicines like prednisone or cortisone This list Codispoti not describe all possible interactions. Give your health care provider a list of all the medicines, herbs, non-prescription drugs, or dietary supplements you use. Also tell them if you smoke, drink alcohol, or use illegal drugs. Some items Geffrard interact with your medicine. What should I watch for while using this medicine? Visit your doctor or health care professional for regular checks on your progress. Your doctor or health care professional Vanhoesen order blood tests and other tests to see how you are doing. Call your doctor or health care professional for advice if you get a fever, chills or sore throat, or other symptoms of a cold or flu. Do not treat yourself. This drug [place]ke decrease your body's ability to fight infection. Try to avoid being around people who are sick. You should make sure you get enough calcium and vitamin D while you are taking this medicine, unless your doctor tells you not to. Discuss the foods you eat and the vitamins you take with your health care professional. See your dentist regularly. Brush and floss your teeth as directed. Before you have any dental work done, tell your dentist you are receiving this medicine. Do not become pregnant while taking this medicine or for 5 months after stopping  it. Talk with your doctor or health care professional about your birth control options while taking this medicine. Women should inform their doctor if they wish to become pregnant or think they might be pregnant. There is a potential for serious side effects to an unborn child. Talk  to your health care professional or pharmacist for more information. What side effects [place] I notice from receiving this medicine? Side effects that you should report to your doctor or health care professional as soon as possible: -allergic reactions like skin rash, itching or hives, swelling of the face, lips, or tongue -bone pain -breathing problems -dizziness -jaw pain, especially after dental work -redness, blistering, peeling of the skin -signs and symptoms of infection like fever or chills; cough; sore throat; pain or trouble passing urine -signs of low calcium like fast heartbeat, muscle cramps or muscle pain; pain, tingling, numbness in the hands or feet; seizures -unusual bleeding or bruising -unusually weak or tired Side effects that usually do not require medical attention (report to your doctor or health care professional if they continue or are bothersome): -constipation -diarrhea -headache -joint pain -loss of appetite -muscle pain -runny nose -tiredness -upset stomach This list Presutti not describe all possible side effects. Call your doctor for medical advice about side effects. You Upadhyay report side effects to FDA at 1-800-FDA-1088. Where should I keep my medicine? This medicine is only given in a clinic, doctor's office, or other health care setting and will not be stored at home. NOTE: This sheet is a summary. It Kops not cover all possible information. If you have questions about this medicine, talk to your doctor, pharmacist, or health care provider.  2018 Elsevier/Gold Standard (2016-08-10 19:17:21)

## 2017-01-06 NOTE — Patient Instructions (Signed)
Implanted Port Home Guide An implanted port is a type of central line that is placed under the skin. Central lines are used to provide IV access when treatment or nutrition needs to be given through a person's veins. Implanted ports are used for long-term IV access. An implanted port Boies be placed because:  You need IV medicine that would be irritating to the small veins in your hands or arms.  You need long-term IV medicines, such as antibiotics.  You need IV nutrition for a long period.  You need frequent blood draws for lab tests.  You need dialysis.  Implanted ports are usually placed in the chest area, but they can also be placed in the upper arm, the abdomen, or the leg. An implanted port has two main parts:  Reservoir. The reservoir is round and will appear as a small, raised area under your skin. The reservoir is the part where a needle is inserted to give medicines or draw blood.  Catheter. The catheter is a thin, flexible tube that extends from the reservoir. The catheter is placed into a large vein. Medicine that is inserted into the reservoir goes into the catheter and then into the vein.  How will I care for my incision site? Do not get the incision site wet. Bathe or shower as directed by your health care provider. How is my port accessed? Special steps must be taken to access the port:  Before the port is accessed, a numbing cream can be placed on the skin. This helps numb the skin over the port site.  Your health care provider uses a sterile technique to access the port. ? Your health care provider must put on a mask and sterile gloves. ? The skin over your port is cleaned carefully with an antiseptic and allowed to dry. ? The port is gently pinched between sterile gloves, and a needle is inserted into the port.  Only "non-coring" port needles should be used to access the port. Once the port is accessed, a blood return should be checked. This helps ensure that the port  is in the vein and is not clogged.  If your port needs to remain accessed for a constant infusion, a clear (transparent) bandage will be placed over the needle site. The bandage and needle will need to be changed every week, or as directed by your health care provider.  Keep the bandage covering the needle clean and dry. Do not get it wet. Follow your health care provider's instructions on how to take a shower or bath while the port is accessed.  If your port does not need to stay accessed, no bandage is needed over the port.  What is flushing? Flushing helps keep the port from getting clogged. Follow your health care provider's instructions on how and when to flush the port. Ports are usually flushed with saline solution or a medicine called heparin. The need for flushing will depend on how the port is used.  If the port is used for intermittent medicines or blood draws, the port will need to be flushed: ? After medicines have been given. ? After blood has been drawn. ? As part of routine maintenance.  If a constant infusion is running, the port Life not need to be flushed.  How long will my port stay implanted? The port can stay in for as long as your health care provider thinks it is needed. When it is time for the port to come out, surgery will be   done to remove it. The procedure is similar to the one performed when the port was put in. When should I seek immediate medical care? When you have an implanted port, you should seek immediate medical care if:  You notice a bad smell coming from the incision site.  You have swelling, redness, or drainage at the incision site.  You have more swelling or pain at the port site or the surrounding area.  You have a fever that is not controlled with medicine.  This information is not intended to replace advice given to you by your health care provider. Make sure you discuss any questions you have with your health care provider. Document  Released: 07/19/2005 Document Revised: 12/25/2015 Document Reviewed: 03/26/2013 Elsevier Interactive Patient Education  2017 Elsevier Inc.  

## 2017-01-06 NOTE — Progress Notes (Signed)
Hematology and Oncology Follow Up Visit  Gilbert [name] [id number] October 09, 1950 66 y.o. 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 His been 6 months is free last saw him. I'm not sure exactly what happened with his last appointment.  We did do a PET scan on him the end of December. The PET scan showed that he was in remission. There is no evidence of hypermetabolic disease.  He is feeling well. He's back to playing some golf. He's had no pain.  He has had no problems with bowels or bladder. He's had no diarrhea. He has been urinating well.  He has had no cough or shortness of breath. He has had no cardiac issues. He does see the cardiologist soon.  We need to get him back on Xgeva. I think this will be very important for him. Given that he has had malignancy in his bones, the [place] See should be helpful.  He has had no fever.  There's been no leg swelling. He's had no rashes. He did pull a tick off the back of his right arm yesterday.  His hair has come back very nicely. He actually has had a haircut already.  Overall, says performance status is ECOG 1.     Medications:  Allergies as of 01/06/2017      Reactions   Oxycodone Swelling, Other (See Comments)   Tongue and lip      Medication List       Accurate as of 01/06/17  3:22 PM. Always use your most recent med list.          allopurinol 300 MG tablet Commonly known as:  ZYLOPRIM TAKE ONE TABLET BY MOUTH ONCE DAILY   amLODipine 10 MG tablet Commonly known as:  NORVASC Take 10 mg by mouth daily.   aspirin EC 81 MG tablet Take 1 tablet (81 mg total) by mouth daily.   atorvastatin 80 MG  tablet Commonly known as:  LIPITOR Take 80 mg by mouth daily.   B-12 5000 MCG Caps Take 5,000 mcg by mouth every morning.   carvedilol 12.5 MG tablet Commonly known as:  COREG Take 12.5 mg by mouth 2 (two) times daily with a meal.   chlorthalidone 25 MG tablet Commonly known as:  HYGROTON   ciprofloxacin 500 MG tablet Commonly known as:  CIPRO Take 1 pill twice a day for 15 days only after each chemotherapy.   clopidogrel 75 MG tablet Commonly known as:  PLAVIX Take 75 mg by mouth daily.   cyclobenzaprine 10 MG tablet Commonly known as:  FLEXERIL Take 1 tablet (10 mg total) by mouth 3 (three) times daily as needed for muscle spasms.   dronabinol 5 MG capsule Commonly known as:  [name] Take 1 capsule (5 mg total) by mouth 2 (two) times daily before a meal.   lidocaine-prilocaine cream Commonly known as:  EMLA Apply 1 application topically daily as needed (port access). Apply to affected area once   LORazepam 0.5 MG tablet Commonly known as:  ATIVAN Take 1 tablet (0.5 mg total) by mouth every 8 (eight) hours.   METAMUCIL PO Take 3 capsules by mouth every evening. Reported  on 10/24/2015   ondansetron 8 MG tablet Commonly known as:  ZOFRAN Take 1 tablet (8 mg total) by mouth 2 (two) times daily as needed for refractory nausea / vomiting. Start on day 3 after cyclophosphamide chemotherapy.   pantoprazole 20 MG tablet Commonly known as:  PROTONIX Take 1 tablet (20 mg total) by mouth daily.   phenytoin 300 MG ER capsule Commonly known as:  DILANTIN Take 1 capsule (300 mg total) by mouth every morning.   polyethylene glycol packet Commonly known as:  MIRALAX / GLYCOLAX Take 17 g by mouth daily as needed for moderate constipation.   predniSONE 20 MG tablet Commonly known as:  DELTASONE Take 3 tablets (60 mg total) by mouth daily. Take on days 1-5 of chemotherapy.   prochlorperazine 10 MG tablet Commonly known as:  COMPAZINE Take 1 tablet (10 mg total) by mouth  every 6 (six) hours as needed (Nausea or vomiting).   pyridOXINE 100 MG tablet Commonly known as:  VITAMIN B-6 Take 100 mg by mouth every morning.   valsartan 160 MG tablet Commonly known as:  DIOVAN Take 160 mg by mouth every morning. Reported on 11/05/2015   vitamin B-1 250 MG tablet Take 250 mg by mouth every morning.       Allergies:  Allergies  Allergen Reactions  . Oxycodone Swelling and Other (See Comments)    Tongue and lip    Past Medical History, Surgical history, Social history, and Family History were reviewed and updated.  Review of Systems: All other 10 point review of systems is negative.   Physical Exam:  weight is 136 lb (61.7 kg). His oral temperature is 98.3 F (36.8 C). His blood pressure is 131/58 (abnormal) and his pulse is 58 (abnormal). His respiration is 18 and oxygen saturation is 100%.   Wt Readings from Last 3 Encounters:  01/06/17 136 lb (61.7 kg)  08/03/16 137 lb (62.1 kg)  06/30/16 140 lb (63.5 kg)    Well-developed and well-nourished white male in no obvious distress. Head and neck exam shows no ocular or oral lesions. No adenopathy noted in the neck. Lungs are clear. Neck exam regular in rhythm with no murmurs, rubs or bruits. Abdomen is soft. Has good bowel sounds. There is no fluid wave. There is no palpable liver or spleen tip. Axillar exam shows no bilateral axillary adenopathy. I did do a rectal exam on him. There is a small external hemorrhoid. There is no masses in the rectal vault. His stool was brown and heme negative Back exam shows no tenderness over the spine, ribs or hips. Extremities shows no clubbing, cyanosis or edema. There Mcgranahan be some slight swelling in the right forearm. He has no tenderness over his long bones. He has good range of motion of his joints. Skin exam shows areas of vitiligo. Neurological exam shows no focal deficits.  Lab Results  Component Value Date   WBC 6.9 01/06/2017   HGB 11.7 (L) 01/06/2017   HCT 33.2  (L) 01/06/2017   MCV 88 01/06/2017   PLT 125 (L) 01/06/2017   Lab Results  Component Value Date   FERRITIN 617 (H) 06/30/2016   IRON 63 06/30/2016   TIBC 169 (L) 06/30/2016   UIBC 106 (L) 06/30/2016   IRONPCTSAT 37 06/30/2016   Lab Results  Component Value Date   RBC 3.79 (L) 01/06/2017   No results found for: KPAFRELGTCHN, LAMBDASER, KAPLAMBRATIO No results found for: IGGSERUM, IGA, IGMSERUM No results found for: TOTALPROTELP, ALBUMINELP, A1GS, A2GS,  Tillman Sers, SPEI   Chemistry      Component Value Date/Time   NA 138 01/06/2017 1331   K 3.3 01/06/2017 1331   CL 107 01/06/2017 1331   CO2 26 01/06/2017 1331   BUN 28 (H) 01/06/2017 1331   CREATININE 1.4 (H) 01/06/2017 1331      Component Value Date/Time   CALCIUM 9.8 01/06/2017 1331   ALKPHOS 90 (H) 01/06/2017 1331   AST 23 01/06/2017 1331   ALT 17 01/06/2017 1331   BILITOT 0.70 01/06/2017 1331     Impression and Plan: Gilbert [name] is a pleasant 66 yo white male with history of 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 He has now been diagnosed with diffuse large cell non-Hodgkin's lymphoma. Cytogenetics were negative for "double hit" lymphoma.  MUGA showed an EF of 42%.  He has been treated with 8 cycles of R-CEOP. This was completed in late November 2017.Marland Kitchen  Had a repeat echocardiogram done in October. This showed an ejection fraction of 40-45%.  We will have to watch him closely. I must say that he has had a unusual form of lymphoma. His unusual to see bone lymphoma.  We still have to watch out with the renal cell carcinoma.  I probably will have to consider some CT scan or possibly another PET scan in a few months.   I will plan to see him back in 2 months. We will try to coordinate visits with his Port-A-Cath flushed and Xgeva injection.   I'm just so happened that he has recovered so nicely from his treatments.      Volanda Napoleon, MD  6/7/20183:22 PM

## 2017-01-07 ENCOUNTER — Telehealth: Payer: Self-pay | Admitting: *Deleted

## 2017-01-07 ENCOUNTER — Other Ambulatory Visit: Payer: Self-pay | Admitting: Hematology & Oncology

## 2017-01-07 LAB — VITAMIN D 25 HYDROXY (VIT D DEFICIENCY, FRACTURES): VIT D 25 HYDROXY: 25.9 ng/mL — AB (ref 30.0–100.0)

## 2017-01-07 LAB — LACTATE DEHYDROGENASE: LDH: 155 U/L (ref 125–245)

## 2017-01-07 NOTE — Telephone Encounter (Signed)
-----   Message from Volanda Napoleon, MD sent at 01/07/2017  7:00 AM EDT ----- Call - the vit D level is low!!!  Need to make sure he is taking 2000 units daily of vit D.  pete

## 2017-01-13 DIAGNOSIS — I2510 Atherosclerotic heart disease of native coronary artery without angina pectoris: Secondary | ICD-10-CM | POA: Diagnosis not present

## 2017-01-13 DIAGNOSIS — R0989 Other specified symptoms and signs involving the circulatory and respiratory systems: Secondary | ICD-10-CM | POA: Diagnosis not present

## 2017-01-27 ENCOUNTER — Encounter: Payer: Self-pay | Admitting: Family Medicine

## 2017-01-27 ENCOUNTER — Ambulatory Visit (INDEPENDENT_AMBULATORY_CARE_PROVIDER_SITE_OTHER): Payer: Medicare Other

## 2017-01-27 ENCOUNTER — Ambulatory Visit (INDEPENDENT_AMBULATORY_CARE_PROVIDER_SITE_OTHER): Payer: Medicare Other | Admitting: Family Medicine

## 2017-01-27 VITALS — BP 102/59 | HR 60 | Temp 97.0°F | Resp 18 | Ht 69.29 in | Wt 136.0 lb

## 2017-01-27 DIAGNOSIS — G40909 Epilepsy, unspecified, not intractable, without status epilepticus: Secondary | ICD-10-CM | POA: Diagnosis not present

## 2017-01-27 DIAGNOSIS — K2100 Gastro-esophageal reflux disease with esophagitis, without bleeding: Secondary | ICD-10-CM

## 2017-01-27 DIAGNOSIS — K222 Esophageal obstruction: Secondary | ICD-10-CM | POA: Diagnosis not present

## 2017-01-27 DIAGNOSIS — M545 Low back pain, unspecified: Secondary | ICD-10-CM

## 2017-01-27 MED ORDER — PHENYTOIN SODIUM EXTENDED 300 MG PO CAPS: 300.0000 mg | ORAL_CAPSULE | Freq: Every morning | ORAL | 1 refills | Status: DC

## 2017-01-27 NOTE — Patient Instructions (Addendum)
I will continue the same dose of Dilantin, but as we discussed your levels were a little but low on prior blood tests. Since you have not had seizures in many years, okay to continue same dose, but we always have the option of slightly increasing that dose. Let me know if you would like to change.   For heartburn and now with vomiting, I would like you to meet with a gastroenterologist to make sure you do not need another endoscopy or have another stricture.  Ok to continue Zantac twice per day for now and avoid foods known to worsen reflux listed below.   Tylenol is okay for low back pain, see information on back pain below. If that worsens or associated with leg symptoms were weakness, return to discuss other workup.  Return to the clinic or go to the nearest emergency room if any of your symptoms worsen or new symptoms occur.  If you would like to discuss depression further including possible treatments, please return as I am happy to discuss that with you further.  Food Choices for Gastroesophageal Reflux Disease, Adult When you have gastroesophageal reflux disease (GERD), the foods you eat and your eating habits are very important. Choosing the right foods can help ease the discomfort of GERD. Consider working with a diet and nutrition specialist (dietitian) to help you make healthy food choices. What general guidelines should I follow? Eating plan  Choose healthy foods low in fat, such as fruits, vegetables, whole grains, low-fat dairy products, and lean meat, fish, and poultry.  Eat frequent, small meals instead of three large meals each day. Eat your meals slowly, in a relaxed setting. Avoid bending over or lying down until 2-3 hours after eating.  Limit high-fat foods such as fatty meats or fried foods.  Limit your intake of oils, butter, and shortening to less than 8 teaspoons each day.  Avoid the following: ? Foods that cause symptoms. These [place] be different for different people.  Keep a food diary to keep track of foods that cause symptoms. ? Alcohol. ? Drinking large amounts of liquid with meals. ? Eating meals during the 2-3 hours before bed.  Cook foods using methods other than frying. This [place] include baking, grilling, or broiling. Lifestyle   Maintain a healthy weight. Ask your health care provider what weight is healthy for you. If you need to lose weight, work with your health care provider to do so safely.  Exercise for at least 30 minutes on 5 or more days each week, or as told by your health care provider.  Avoid wearing clothes that fit tightly around your waist and chest.  Do not use any products that contain nicotine or tobacco, such as cigarettes and e-cigarettes. If you need help quitting, ask your health care provider.  Sleep with the head of your bed raised. Use a wedge under the mattress or blocks under the bed frame to raise the head of the bed. What foods are not recommended? The items listed Ault not be a complete list. Talk with your dietitian about what dietary choices are best for you. Grains Pastries or quick breads with added fat. [place] toast. Vegetables Deep fried vegetables. [place] fries. Any vegetables prepared with added fat. Any vegetables that cause symptoms. For some people this [place] include tomatoes and tomato products, chili peppers, onions and garlic, and horseradish. Fruits Any fruits prepared with added fat. Any fruits that cause symptoms. For some people this Fred[place] include citrus fruits, such as  oranges, grapefruit, pineapple, and lemons. Meats and other protein foods High-fat meats, such as fatty beef or pork, hot dogs, ribs, ham, sausage, salami and bacon. Fried meat or protein, including fried fish and fried chicken. Nuts and nut butters. Dairy Whole milk and chocolate milk. Sour cream. Cream. Ice cream. Cream cheese. Milk shakes. Beverages Coffee and tea, with or without caffeine. Carbonated beverages. Sodas. Energy  drinks. Fruit juice made with acidic fruits (such as orange or grapefruit). Tomato juice. Alcoholic drinks. Fats and oils Butter. Margarine. Shortening. Ghee. Sweets and desserts Chocolate and cocoa. Donuts. Seasoning and other foods Pepper. Peppermint and spearmint. Any condiments, herbs, or seasonings that cause symptoms. For some people, this [place] include curry, hot sauce, or vinegar-based salad dressings. Summary  When you have gastroesophageal reflux disease (GERD), food and lifestyle choices are very important to help ease the discomfort of GERD.  Eat frequent, small meals instead of three large meals each day. Eat your meals slowly, in a relaxed setting. Avoid bending over or lying down until 2-3 hours after eating.  Limit high-fat foods such as fatty meat or fried foods. This information is not intended to replace advice given to you by your health care provider. Make sure you discuss any questions you have with your health care provider. Document Released: 07/19/2005 Document Revised: 07/20/2016 Document Reviewed: 07/20/2016 Elsevier Interactive Patient Education  2017 Else[employer] Inc.   Back Pain, Adult Back pain is very common in adults.The cause of back pain is rarely dangerous and the pain often gets better over time.The cause of your back pain Bilotta not be known. Some common causes of back pain include:  Strain of the muscles or ligaments supporting the spine.  Wear and tear (degeneration) of the spinal disks.  Arthritis.  Direct injury to the back.  For many people, back pain Napp return. Since back pain is rarely dangerous, most people can learn to manage this condition on their own. Follow these instructions at home: Watch your back pain for any changes. The following actions Colucci help to lessen any discomfort you are feeling:  Remain active. It is stressful on your back to sit or stand in one place for long periods of time. Do not sit, drive, or stand in one place for  more than 30 minutes at a time. Take short walks on even surfaces as soon as you are able.Try to increase the length of time you walk each day.  Exercise regularly as directed by your health care provider. Exercise helps your back heal faster. It also helps avoid future injury by keeping your muscles strong and flexible.  Do not stay in bed.Resting more than 1-2 days can delay your recovery.  Pay attention to your body when you bend and lift. The most comfortable positions are those that put less stress on your recovering back. Always use proper lifting techniques, including: ? Bending your knees. ? Keeping the load close to your body. ? Avoiding twisting.  Find a comfortable position to sleep. Use a firm mattress and lie on your side with your knees slightly bent. If you lie on your back, put a pillow under your knees.  Avoid feeling anxious or stressed.Stress increases muscle tension and can worsen back pain.It is important to recognize when you are anxious or stressed and learn ways to manage it, such as with exercise.  Take medicines only as directed by your health care provider. Over-the-counter medicines to reduce pain and inflammation are often the most helpful.Your health care provider  Garlock prescribe muscle relaxant drugs.These medicines help dull your pain so you can more quickly return to your normal activities and healthy exercise.  Apply ice to the injured area: ? Put ice in a plastic bag. ? Place a towel between your skin and the bag. ? Leave the ice on for 20 minutes, 2-3 times a day for the first 2-3 days. After that, ice and heat Gaetz be alternated to reduce pain and spasms.  Maintain a healthy weight. Excess weight puts extra stress on your back and makes it difficult to maintain good posture.  Contact a health care provider if:  You have pain that is not relieved with rest or medicine.  You have increasing pain going down into the legs or buttocks.  You have pain  that does not improve in one week.  You have night pain.  You lose weight.  You have a fever or chills. Get help right away if:  You develop new bowel or bladder control problems.  You have unusual weakness or numbness in your arms or legs.  You develop nausea or vomiting.  You develop abdominal pain.  You feel faint. This information is not intended to replace advice given to you by your health care provider. Make sure you discuss any questions you have with your health care provider. Document Released: 07/19/2005 Document Revised: 11/27/2015 Document Reviewed: 11/20/2013 Elsevier Interactive Patient Education  2017 [employer].   IF you received an x-ray today, you will receive an invoice from [facility]. Please contact [facility] at [phone] with questions or concerns regarding your invoice.   IF you received labwork today, you will receive an invoice from [facility]. Please contact [facility] at [phone] with questions or concerns regarding your invoice.   Our billing staff will not be able to assist you with questions regarding bills from these companies.  You will be contacted with the lab results as soon as they are available. The fastest way to get your results is to activate your My Chart account. Instructions are located on the last page of this paperwork. If you have not heard from [place] regarding the results in 2 weeks, please contact this office.

## 2017-01-27 NOTE — Progress Notes (Signed)
Subjective:  By signing my name below, I, Moises Blood, attest that this documentation has been prepared under the direction and in the presence of [name], MD. Electronically Signed: Moises Blood, [place]. 01/27/2017 , 9:45 AM .  Patient was seen in Room 25 .   Patient ID: Gilbert [name], male    DOB: 1951-01-16, 66 y.o.   MRN: [id number] Chief Complaint  Patient presents with  . Annual Exam   HPI Gilbert [name] is a 66 y.o. male  Here for recheck. H/o multiple medical problems including PAD, CAD, renal cancer, lymphoma and seizures. I last saw him on Jan 2nd to establish care from Dr. Everlene Farrier. His oncologist is Dr. Marin Olp and his cardiologist is Dr. Einar Gip.   Seizures He's been treated with Dilantin, level at 4.7 in Feb 2017, but without any recent seizures, dosage was maintained. He reported last seizure was 10-15 years ago. He was continued 300mg  extended release QD; recommended follow up with dentist given chronic use of Dilantin.   His Dilantin level was 7.2 in Jan; he was continued on the same dose.    Heartburn/reflux He mentions not being able to take stronger heartburn medication, advised by his nephrologist. He has heartburn every day. He's been taking Zantac for 2 weeks now and tried Tums in the past. He takes Zantac twice a day, but unsure if there's any relief. He saw GI in June 2016, Dr. Carlean Purl, with upper endoscopy done, ring-like stricture at GE junction - dilated with 27mm balloon; and antral erythema and mottling - gastritis.   He's been having trouble gaining weight. He did have an episode of vomiting yesterday morning, first time in a long time. He denies night sweats, fever, dark or tarry stools, or blood in stool. He denies alcohol use. He denies eating spicy foods.    Depression He notes tired of hearing bad things going on in his life, "I just don't want to hear any more bad news". He denies SI/HI. He plays golf to stay happy.    Back pain He mentions  having intermittent low back pain mostly when he stands up for an extended period of time, especially at work. He has some bowel urgency, but he denies urinary or bowel incontinence, or saddle anesthesia. He denies back pain at this time.    Patient Active Problem List   Diagnosis Date Noted  . 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 Hematuria 10/29/2015  . Dysphagia, pharyngoesophageal phase   . Early satiety   . Esophageal stricture   . Gastritis and gastroduodenitis   . PAD (peripheral artery disease) ([place]) 05/28/2014  . Cardiomyopathy, ischemic 04/03/2014  . History of left heart catheterization (LHC) 02/26/2014  . Arterial bleed, intraoperative 02/26/2014  . Seizures ([place]) 11/27/2013  . HTN (hypertension) 11/27/2013  . Hypomagnesemia 11/27/2013  . CAD (coronary artery disease), native coronary artery 11/26/2013  . Renal cancer ([place]) 10/19/2013  . Coronary artery calcification 09/25/2013  . Renal cyst 08/30/2013  . Loss of weight 08/03/2013   Past Medical History:  Diagnosis Date  . Abnormal liver enzymes    Chronic alkaline phosphatase elevation since 2014  . Acid reflux   . CAD (coronary artery disease)   . Cancer ([place]) 08/2013   right kidney cancer  . Cancer ([place])   . Coronary artery calcification seen on CAT scan   . Coronary atherosclerosis of native coronary artery   .  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 Essential hypertension, benign   . Family history of anesthesia complication    mother-had stroke during anesthesia  . GERD (gastroesophageal reflux disease)   . Gout   . History of cardiac arrest 11/26/13   PTCA/Stenting of LM & Prox LAD with 4.0 x 18 mm Xience alpine stent. Used Impella Circulatory assist device. EF= 20-25%  . History of epilepsy    at least  10 years ago. 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 History of myocardial infarction less than 8 weeks    Stent placed  . History of nephrectomy 11/2013   For renal cell carcinoma  . History of tobacco use   . HTN (hypertension)   . Hypercholesteremia   . Hyperlipemia   . Hypertension   . PAD (peripheral artery disease) ([place])   . Seizures ([place])   . Shortness of breath    with activity  . SOB (shortness of breath)   . Systolic and diastolic CHF, chronic (HCC)    Resolved. EF 45% 04/10/2014  . Therapeutic drug monitoring    Past Surgical History:  Procedure Laterality Date  . BALLOON DILATION N/A 01/21/2015   Procedure: BALLOON DILATION;  Surgeon: Gatha Mayer, MD;  Location: [facility] ENDOSCOPY;  Service: Endoscopy;  Laterality: N/A;  . CARDIAC CATHETERIZATION N/A 06/10/2015   Procedure: Left Heart Cath and Coronary Angiography;  Surgeon: Adrian Prows, MD;  Location: [place] CV LAB;  Service: Cardiovascular;  Laterality: N/A;  . CORONARY ANGIOPLASTY WITH STENT PLACEMENT  2015   Left main coronary artery stenting on emergent basis along with circulatory support with Impella device through left leg. With 4.0 x 18 mm Xience alpine stent  . ESOPHAGOGASTRODUODENOSCOPY    . ESOPHAGOGASTRODUODENOSCOPY N/A 01/21/2015   Procedure: ESOPHAGOGASTRODUODENOSCOPY (EGD) with possible dilation.;  Surgeon: Gatha Mayer, MD;  Location: [facility] ENDOSCOPY;  Service: Endoscopy;  Laterality: N/A;  . EYE SURGERY Left 2 weeks ago   torn retina  . ILIAC ARTERY STENT Left 05/28/2014   dr Einar Gip  . LEFT HEART CATHETERIZATION WITH CORONARY ANGIOGRAM N/A 11/27/2013   Procedure: LEFT HEART CATHETERIZATION WITH CORONARY ANGIOGRAM;  Surgeon: [name], MD;  Location: [facility] CATH LAB;  Service: Cardiovascular;  Laterality: N/A;  . LOWER EXTREMITY ANGIOGRAM N/A 02/26/2014   Procedure: LOWER EXTREMITY ANGIOGRAM;  Surgeon: [name], MD;  Location: [facility] CATH LAB;  Service: Cardiovascular;  Laterality: N/A;  . LOWER  EXTREMITY ANGIOGRAM N/A 05/28/2014   Procedure: LOWER EXTREMITY ANGIOGRAM;  Surgeon: [name], MD;  Location: [facility] CATH LAB;  Service: Cardiovascular;  Laterality: N/A;  . NEPHRECTOMY  11/2013  . NO PAST SURGERIES    . PERCUTANEOUS CORONARY STENT INTERVENTION (PCI-S)  11/27/2013   Procedure: PERCUTANEOUS CORONARY STENT INTERVENTION (PCI-S);  Surgeon: [name], MD;  Location: [facility] CATH LAB;  Service: Cardiovascular;;  . ROBOT ASSISTED LAPAROSCOPIC NEPHRECTOMY Right 10/19/2013   Procedure: ROBOTIC ASSISTED LAPAROSCOPIC NEPHRECTOMY,  EXTENSIVE ADHESIOLYSIS;  Surgeon: Alexis Frock, MD;  Location: [facility] ORS;  Service: Urology;  Laterality: Right;   Allergies  Allergen Reactions  . Oxycodone Swelling and Other (See Comments)    Tongue and lip   Prior to Admission medications   Medication Sig Start Date End Date Taking? Authorizing Provider  amLODipine (NORVASC) 10 MG tablet Take 10 mg by mouth daily.    [provider]  aspirin EC 81 MG tablet Take 1 tablet (81 mg total) by  mouth daily. Patient taking differently: Take 81 mg by mouth every morning.  12/11/13   Angiulli, Lavon Paganini, PA-C  atorvastatin (LIPITOR) 80 MG tablet Take 80 mg by mouth daily. 09/05/15   [provider]  carvedilol (COREG) 12.5 MG tablet Take 12.5 mg by mouth 2 (two) times daily with a meal.  11/13/14   [provider]  chlorthalidone (HYGROTON) 25 MG tablet  12/23/16   [provider]  clopidogrel (PLAVIX) 75 MG tablet Take 75 mg by mouth daily.  10/31/15   [provider]  Cyanocobalamin (B-12) 5000 MCG CAPS Take 5,000 mcg by mouth every morning.     [provider]  cyclobenzaprine (FLEXERIL) 10 MG tablet Take 1 tablet (10 mg total) by mouth 3 (three) times daily as needed for muscle spasms. 10/23/15   Veryl Speak, MD  lidocaine-prilocaine (EMLA) cream Apply 1 application topically daily as needed (port access). Apply to affected area once 01/06/17   Ennever, Rudell Cobb,  MD  ondansetron (ZOFRAN) 8 MG tablet Take 1 tablet (8 mg total) by mouth 2 (two) times daily as needed for refractory nausea / vomiting. Start on day 3 after cyclophosphamide chemotherapy. Patient not taking: Reported on 08/03/2016 01/22/16   Volanda Napoleon, MD  pantoprazole (PROTONIX) 20 MG tablet Take 1 tablet (20 mg total) by mouth daily. 02/19/16   Lacretia Leigh, MD  phenytoin (DILANTIN) 300 MG ER capsule Take 1 capsule (300 mg total) by mouth every morning. 08/03/16   Wendie Agreste, MD  polyethylene glycol [facility] / Floria Raveling) packet Take 17 g by mouth daily as needed for moderate constipation.     [provider]  Psyllium (METAMUCIL PO) Take 3 capsules by mouth every evening. Reported on 10/24/2015    [provider]  pyridOXINE (VITAMIN B-6) 100 MG tablet Take 100 mg by mouth every morning.     [provider]  Thiamine HCl (VITAMIN B-1) 250 MG tablet Take 250 mg by mouth every morning.     [provider]  valsartan (DIOVAN) 160 MG tablet Take 160 mg by mouth every morning. Reported on 11/05/2015 11/08/14   [provider]   Social History   Social History  . Marital status: Single    Spouse name: N/A  . Number of children: N/A  . Years of education: N/A   Occupational History  . Not on file.   Social History Main Topics  . Smoking status: Former Smoker    Packs/day: 2.00    Years: 45.00    Types: Cigarettes    Quit date: 08/03/2003  . Smokeless tobacco: Current User    Types: Chew     Comment: quit  smoking 10 years agop  . Alcohol use No     Comment: quit 1980  . Drug use: No     Comment: none in past 30 years   . Sexual activity: Not on file   Other Topics Concern  . Not on file   Social History Narrative   ** Merged History Encounter **       Review of Systems  Constitutional: Negative for fatigue and unexpected weight change.  Eyes: Negative for visual disturbance.  Respiratory: Negative for cough, chest tightness and  shortness of breath.   Cardiovascular: Negative for chest pain, palpitations and leg swelling.  Gastrointestinal: Positive for vomiting. Negative for abdominal pain and blood in stool.  Neurological: Negative for dizziness, light-headedness and headaches.  Psychiatric/Behavioral: The patient is nervous/anxious.  Objective:   Physical Exam  Constitutional: He is oriented to person, place, and time. He appears well-developed and well-nourished.  HENT:  Head: Normocephalic and atraumatic.  Eyes: EOM are normal. Pupils are equal, round, and reactive to light.  Neck: No JVD present. Carotid bruit is not present.  Cardiovascular: Normal rate, regular rhythm and normal heart sounds.   No murmur heard. Pulmonary/Chest: Effort normal and breath sounds normal. He has no rales.  Musculoskeletal: He exhibits no edema.  No focal bony tenderness, 90 degrees flexion, equal ROM, able to heel-toe walk without difficulty  Neurological: He is alert and oriented to person, place, and time.  Reflex Scores:      Patellar reflexes are 2+ on the right side and 2+ on the left side.      Achilles reflexes are 2+ on the right side and 2+ on the left side. Skin: Skin is warm and dry.  Psychiatric: He has a normal mood and affect.  Vitals reviewed.   Vitals:   01/27/17 0915  BP: (!) 102/59  Pulse: 60  Resp: 18  Temp: 97 F (36.1 C)  TempSrc: Oral  SpO2: 100%  Weight: 136 lb (61.7 kg)  Height: 5' 9.29" (1.76 m)      Assessment & Plan:    Gilbert [name] is a 66 y.o. male Seizure disorder ([place]) - Plan: phenytoin (DILANTIN) 300 MG ER capsule  - Stable, has not had seizures in many years. Discussed borderline low levels, but would like to continue same dosing  Gastroesophageal reflux disease with esophagitis - Plan: Ambulatory referral to Gastroenterology Esophageal stricture - Plan: Ambulatory referral to Gastroenterology  - Persistent reflux, unable to take PPI based on solitary  kidney/recommendations from nephrologist. Has been cleared to use H2 blocker, but insufficient relief with just Zantac. Prior gastritis and esophageal stricture that required dilation in 2016. Now with vomiting, but single episode.  - Handout given on foods to avoid for reflux, continue Zantac for now, but refer back to gastroenterology as Sabet need repeat endoscopy.  Low back pain without sciatica, unspecified back pain laterality, unspecified chronicity - Plan: DG Lumbar Spine Complete  - Intermittent, notes more with standing. Minkin be stiffness with DDD. Range of motion/stretching Nordin be helpful, check x-ray to evaluate bony cause.  Briefly discussed depression symptoms, ultimately he denied true depression but frustration with his medical problems. He is still doing activities for enjoyment including golfing. Advised to follow-up if he feels more depressed or anhedonia symptoms. Understanding expressed  Meds ordered this encounter  Medications  . phenytoin (DILANTIN) 300 MG ER capsule    Sig: Take 1 capsule (300 mg total) by mouth every morning.    Dispense:  90 capsule    Refill:  1   Patient Instructions   I will continue the same dose of Dilantin, but as we discussed your levels were a little but low on prior blood tests. Since you have not had seizures in many years, okay to continue same dose, but we always have the option of slightly increasing that dose. Let me know if you would like to change.   For heartburn and now with vomiting, I would like you to meet with a gastroenterologist to make sure you do not need another endoscopy or have another stricture.  Ok to continue Zantac twice per day for now and avoid foods known to worsen reflux listed below.   Tylenol is okay for low back pain, see information on back pain below. If that worsens  or associated with leg symptoms were weakness, return to discuss other workup.  Return to the clinic or go to the nearest emergency room if any of  your symptoms worsen or new symptoms occur.  If you would like to discuss depression further including possible treatments, please return as I am happy to discuss that with you further.  Food Choices for Gastroesophageal Reflux Disease, Adult When you have gastroesophageal reflux disease (GERD), the foods you eat and your eating habits are very important. Choosing the right foods can help ease the discomfort of GERD. Consider working with a diet and nutrition specialist (dietitian) to help you make healthy food choices. What general guidelines should I follow? Eating plan  Choose healthy foods low in fat, such as fruits, vegetables, whole grains, low-fat dairy products, and lean meat, fish, and poultry.  Eat frequent, small meals instead of three large meals each day. Eat your meals slowly, in a relaxed setting. Avoid bending over or lying down until 2-3 hours after eating.  Limit high-fat foods such as fatty meats or fried foods.  Limit your intake of oils, butter, and shortening to less than 8 teaspoons each day.  Avoid the following: ? Foods that cause symptoms. These Nading be different for different people. Keep a food diary to keep track of foods that cause symptoms. ? Alcohol. ? Drinking large amounts of liquid with meals. ? Eating meals during the 2-3 hours before bed.  Cook foods using methods other than frying. This [place] include baking, grilling, or broiling. Lifestyle   Maintain a healthy weight. Ask your health care provider what weight is healthy for you. If you need to lose weight, work with your health care provider to do so safely.  Exercise for at least 30 minutes on 5 or more days each week, or as told by your health care provider.  Avoid wearing clothes that fit tightly around your waist and chest.  Do not use any products that contain nicotine or tobacco, such as cigarettes and e-cigarettes. If you need help quitting, ask your health care provider.  Sleep with the  head of your bed raised. Use a wedge under the mattress or blocks under the bed frame to raise the head of the bed. What foods are not recommended? The items listed Bantz not be a complete list. Talk with your dietitian about what dietary choices are best for you. Grains Pastries or quick breads with added fat. [place] toast. Vegetables Deep fried vegetables. [place] fries. Any vegetables prepared with added fat. Any vegetables that cause symptoms. For some people this [place] include tomatoes and tomato products, chili peppers, onions and garlic, and horseradish. Fruits Any fruits prepared with added fat. Any fruits that cause symptoms. For some people this Qazi include citrus fruits, such as oranges, grapefruit, pineapple, and lemons. Meats and other protein foods High-fat meats, such as fatty beef or pork, hot dogs, ribs, ham, sausage, salami and bacon. Fried meat or protein, including fried fish and fried chicken. Nuts and nut butters. Dairy Whole milk and chocolate milk. Sour cream. Cream. Ice cream. Cream cheese. Milk shakes. Beverages Coffee and tea, with or without caffeine. Carbonated beverages. Sodas. Energy drinks. Fruit juice made with acidic fruits (such as orange or grapefruit). Tomato juice. Alcoholic drinks. Fats and oils Butter. Margarine. Shortening. Ghee. Sweets and desserts Chocolate and cocoa. Donuts. Seasoning and other foods Pepper. Peppermint and spearmint. Any condiments, herbs, or seasonings that cause symptoms. For some people, this [place] include curry, hot sauce, or  vinegar-based salad dressings. Summary  When you have gastroesophageal reflux disease (GERD), food and lifestyle choices are very important to help ease the discomfort of GERD.  Eat frequent, small meals instead of three large meals each day. Eat your meals slowly, in a relaxed setting. Avoid bending over or lying down until 2-3 hours after eating.  Limit high-fat foods such as fatty meat or fried foods. This  information is not intended to replace advice given to you by your health care provider. Make sure you discuss any questions you have with your health care provider. Document Released: 07/19/2005 Document Revised: 07/20/2016 Document Reviewed: 07/20/2016 Elsevier Interactive Patient Education  2017 Elsevier Inc.   Back Pain, Adult Back pain is very common in adults.The cause of back pain is rarely dangerous and the pain often gets better over time.The cause of your back pain V[place] not be known. Some common causes of back pain include:  Strain of the muscles or ligaments supporting the spine.  Wear and tear (degeneration) of the spinal disks.  Arthritis.  Direct injury to the back.  For many people, back pain Reza return. Since back pain is rarely dangerous, most people can learn to manage this condition on their own. Follow these instructions at home: Watch your back pain for any changes. The following actions Garcia help to lessen any discomfort you are feeling:  Remain active. It is stressful on your back to sit or stand in one place for long periods of time. Do not sit, drive, or stand in one place for more than 30 minutes at a time. Take short walks on even surfaces as soon as you are able.Try to increase the length of time you walk each day.  Exercise regularly as directed by your health care provider. Exercise helps your back heal faster. It also helps avoid future injury by keeping your muscles strong and flexible.  Do not stay in bed.Resting more than 1-2 days can delay your recovery.  Pay attention to your body when you bend and lift. The most comfortable positions are those that put less stress on your recovering back. Always use proper lifting techniques, including: ? Bending your knees. ? Keeping the load close to your body. ? Avoiding twisting.  Find a comfortable position to sleep. Use a firm mattress and lie on your side with your knees slightly bent. If you lie on your  back, put a pillow under your knees.  Avoid feeling anxious or stressed.Stress increases muscle tension and can worsen back pain.It is important to recognize when you are anxious or stressed and learn ways to manage it, such as with exercise.  Take medicines only as directed by your health care provider. Over-the-counter medicines to reduce pain and inflammation are often the most helpful.Your health care provider Karnik prescribe muscle relaxant drugs.These medicines help dull your pain so you can more quickly return to your normal activities and healthy exercise.  Apply ice to the injured area: ? Put ice in a plastic bag. ? Place a towel between your skin and the bag. ? Leave the ice on for 20 minutes, 2-3 times a day for the first 2-3 days. After that, ice and heat Laden be alternated to reduce pain and spasms.  Maintain a healthy weight. Excess weight puts extra stress on your back and makes it difficult to maintain good posture.  Contact a health care provider if:  You have pain that is not relieved with rest or medicine.  You have increasing pain going  down into the legs or buttocks.  You have pain that does not improve in one week.  You have night pain.  You lose weight.  You have a fever or chills. Get help right away if:  You develop new bowel or bladder control problems.  You have unusual weakness or numbness in your arms or legs.  You develop nausea or vomiting.  You develop abdominal pain.  You feel faint. This information is not intended to replace advice given to you by your health care provider. Make sure you discuss any questions you have with your health care provider. Document Released: 07/19/2005 Document Revised: 11/27/2015 Document Reviewed: 11/20/2013 Elsevier Interactive Patient Education  2017 [employer].   IF you received an x-ray today, you will receive an invoice from [facility] Radiology. Please contact [facility] at [phone] with  questions or concerns regarding your invoice.   IF you received labwork today, you will receive an invoice from [facility]. Please contact [facility] at [phone] with questions or concerns regarding your invoice.   Our billing staff will not be able to assist you with questions regarding bills from these companies.  You will be contacted with the lab results as soon as they are available. The fastest way to get your results is to activate your My Chart account. Instructions are located on the last page of this paperwork. If you have not heard from [place] regarding the results in 2 weeks, please contact this office.       I personally performed the services described in this documentation, which was scribed in my presence. The recorded information has been reviewed and considered for accuracy and completeness, addended by me as needed, and agree with information above.  Signed,   [name], MD [facility].  01/27/17 10:04 AM

## 2017-02-03 ENCOUNTER — Ambulatory Visit: Payer: Medicare Other | Admitting: Family Medicine

## 2017-03-04 DIAGNOSIS — C799 Secondary malignant neoplasm of unspecified site: Secondary | ICD-10-CM | POA: Diagnosis not present

## 2017-03-04 DIAGNOSIS — I255 Ischemic cardiomyopathy: Secondary | ICD-10-CM | POA: Diagnosis not present

## 2017-03-04 DIAGNOSIS — I2510 Atherosclerotic heart disease of native coronary artery without angina pectoris: Secondary | ICD-10-CM | POA: Diagnosis not present

## 2017-03-04 DIAGNOSIS — R42 Dizziness and giddiness: Secondary | ICD-10-CM | POA: Diagnosis not present

## 2017-03-17 DIAGNOSIS — R809 Proteinuria, unspecified: Secondary | ICD-10-CM | POA: Diagnosis not present

## 2017-03-17 DIAGNOSIS — I10 Essential (primary) hypertension: Secondary | ICD-10-CM | POA: Diagnosis not present

## 2017-03-17 DIAGNOSIS — N183 Chronic kidney disease, stage 3 (moderate): Secondary | ICD-10-CM | POA: Diagnosis not present

## 2017-03-22 ENCOUNTER — Encounter: Payer: Self-pay | Admitting: Family Medicine

## 2017-03-22 ENCOUNTER — Ambulatory Visit (INDEPENDENT_AMBULATORY_CARE_PROVIDER_SITE_OTHER): Payer: Medicare Other | Admitting: Family Medicine

## 2017-03-22 VITALS — BP 125/65 | HR 56 | Temp 97.7°F | Resp 16 | Ht 69.0 in | Wt 133.0 lb

## 2017-03-22 DIAGNOSIS — L089 Local infection of the skin and subcutaneous tissue, unspecified: Secondary | ICD-10-CM

## 2017-03-22 DIAGNOSIS — L723 Sebaceous cyst: Secondary | ICD-10-CM

## 2017-03-22 MED ORDER — DOXYCYCLINE HYCLATE 100 MG PO TABS: 100.0000 mg | ORAL_TABLET | Freq: Two times a day (BID) | ORAL | 0 refills | Status: DC

## 2017-03-22 NOTE — Progress Notes (Signed)
Subjective:  By signing my name below, I, Moises Blood, attest that this documentation has been prepared under the direction and in the presence of [name], MD. Electronically Signed: Moises Blood, [place]. 03/22/2017 , 11:37 AM .  Patient was seen in Room 12 .   Patient ID: Gilbert [name], male    DOB: Feb 27, 1951, 66 y.o.   MRN: [id number] Chief Complaint  Patient presents with  . Abscess    back of neck   HPI Gilbert [name] is a 66 y.o. male  Patient here for possible abscess on back of his neck. He has history of multiple medical problems including non-Hodgkin's lymphoma, seizure disorder, CAD, HTN and renal cancer. His oncologist is Dr. Marin Olp.   Patient states he noticed this abscess appearing about 2 weeks ago. His daughter mashed on the area about 5 days ago, with blood and pus expressed; but unable to get the core out. He notes the area itches. He's applied some cortisone cream to the area. He denies having a fever. He isn't on chemo recently; last done in Dec.   Patient Active Problem List   Diagnosis Date Noted  . Diffuse non-Hodgkin's lymphoma of bone ([place]) 01/08/2016  . 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 Hematuria 10/29/2015  . Dysphagia, pharyngoesophageal phase   . Early satiety   . Esophageal stricture   . Gastritis and gastroduodenitis   . PAD (peripheral artery disease) ([place]) 05/28/2014  . Cardiomyopathy, ischemic 04/03/2014  . History of left heart catheterization (LHC) 02/26/2014  . Arterial bleed, intraoperative 02/26/2014  . Seizures ([place]) 11/27/2013  . HTN (hypertension) 11/27/2013  . Hypomagnesemia 11/27/2013  . CAD (coronary artery disease), native coronary artery 11/26/2013  . Renal cancer ([place]) 10/19/2013  . Coronary artery calcification 09/25/2013  . Renal cyst 08/30/2013  . Loss of weight 08/03/2013   Past Medical History:  Diagnosis Date  . Abnormal liver enzymes    Chronic alkaline phosphatase elevation since 2014  . Acid reflux   . CAD (coronary artery disease)   . Cancer ([place]) 08/2013   right kidney cancer  . Cancer ([place])   . Coronary artery calcification seen on CAT scan   . Coronary atherosclerosis of native coronary artery   . 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 Diffuse non-Hodgkin's lymphoma of bone ([place]) 01/08/2016  . Essential hypertension, benign   . Family history of anesthesia complication    mother-had stroke during anesthesia  . GERD (gastroesophageal reflux disease)   . Gout   . History of cardiac arrest 11/26/13   PTCA/Stenting of LM & Prox LAD with 4.0 x 18 mm Xience alpine stent. Used Impella Circulatory assist device. EF= 20-25%  . History of epilepsy    at least 10 years ago. 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 History of myocardial infarction less than 8 weeks    Stent placed  . History of nephrectomy 11/2013   For renal cell carcinoma  . History of tobacco use   . HTN (hypertension)   . Hypercholesteremia   . Hyperlipemia   . Hypertension   . PAD (peripheral artery disease) ([place])   . Seizures ([place])   . Shortness of breath    with activity  . SOB (shortness of breath)   . Systolic and diastolic CHF, chronic (HCC)    Resolved. EF 45% 04/10/2014  . Therapeutic drug monitoring    Past Surgical History:  Procedure Laterality Date  . BALLOON DILATION  N/A 01/21/2015   Procedure: BALLOON DILATION;  Surgeon: Gatha Mayer, MD;  Location: [facility] ENDOSCOPY;  Service: Endoscopy;  Laterality: N/A;  . CARDIAC CATHETERIZATION N/A 06/10/2015   Procedure: Left Heart Cath and Coronary Angiography;  Surgeon: Adrian Prows, MD;  Location: [facility] CV LAB;  Service: Cardiovascular;  Laterality: N/A;  . CORONARY ANGIOPLASTY WITH STENT PLACEMENT  2015   Left main coronary artery stenting on emergent basis along with circulatory support with Impella device through left leg. With 4.0 x 18 mm Xience alpine stent  .  ESOPHAGOGASTRODUODENOSCOPY    . ESOPHAGOGASTRODUODENOSCOPY N/A 01/21/2015   Procedure: ESOPHAGOGASTRODUODENOSCOPY (EGD) with possible dilation.;  Surgeon: Gatha Mayer, MD;  Location: [facility] ENDOSCOPY;  Service: Endoscopy;  Laterality: N/A;  . EYE SURGERY Left 2 weeks ago   torn retina  . ILIAC ARTERY STENT Left 05/28/2014   dr Einar Gip  . LEFT HEART CATHETERIZATION WITH CORONARY ANGIOGRAM N/A 11/27/2013   Procedure: LEFT HEART CATHETERIZATION WITH CORONARY ANGIOGRAM;  Surgeon: [name], MD;  Location: [facility] CATH LAB;  Service: Cardiovascular;  Laterality: N/A;  . LOWER EXTREMITY ANGIOGRAM N/A 02/26/2014   Procedure: LOWER EXTREMITY ANGIOGRAM;  Surgeon: [name], MD;  Location: [facility] CATH LAB;  Service: Cardiovascular;  Laterality: N/A;  . LOWER EXTREMITY ANGIOGRAM N/A 05/28/2014   Procedure: LOWER EXTREMITY ANGIOGRAM;  Surgeon: [name], MD;  Location: [facility] CATH LAB;  Service: Cardiovascular;  Laterality: N/A;  . NEPHRECTOMY  11/2013  . NO PAST SURGERIES    . PERCUTANEOUS CORONARY STENT INTERVENTION (PCI-S)  11/27/2013   Procedure: PERCUTANEOUS CORONARY STENT INTERVENTION (PCI-S);  Surgeon: [name], MD;  Location: [facility] CATH LAB;  Service: Cardiovascular;;  . ROBOT ASSISTED LAPAROSCOPIC NEPHRECTOMY Right 10/19/2013   Procedure: ROBOTIC ASSISTED LAPAROSCOPIC NEPHRECTOMY,  EXTENSIVE ADHESIOLYSIS;  Surgeon: Alexis Frock, MD;  Location: [facility] ORS;  Service: Urology;  Laterality: Right;   Allergies  Allergen Reactions  . Oxycodone Swelling and Other (See Comments)    Tongue and lip   Prior to Admission medications   Medication Sig Start Date End Date Taking? Authorizing Provider  amLODipine (NORVASC) 10 MG tablet Take 10 mg by mouth daily.   Yes [provider]  aspirin EC 81 MG tablet Take 1 tablet (81 mg total) by mouth daily. Patient taking differently: Take 81 mg by mouth every morning.  12/11/13  Yes Angiulli, Lavon Paganini, PA-C  atorvastatin (LIPITOR) 80 MG tablet  Take 80 mg by mouth daily. 09/05/15  Yes [provider]  carvedilol (COREG) 12.5 MG tablet Take 12.5 mg by mouth 2 (two) times daily with a meal.  11/13/14  Yes [provider]  chlorthalidone (HYGROTON) 25 MG tablet  12/23/16  Yes [provider]  clopidogrel (PLAVIX) 75 MG tablet Take 75 mg by mouth daily.  10/31/15  Yes [provider]  Cyanocobalamin (B-12) 5000 MCG CAPS Take 5,000 mcg by mouth every morning.    Yes [provider]  lidocaine-prilocaine (EMLA) cream Apply 1 application topically daily as needed (port access). Apply to affected area once 01/06/17  Yes Ennever, Rudell Cobb, MD  pantoprazole (PROTONIX) 20 MG tablet Take 1 tablet (20 mg total) by mouth daily. 02/19/16  Yes Lacretia Leigh, MD  phenytoin (DILANTIN) 300 MG ER capsule Take 1 capsule (300 mg total) by mouth every morning. 01/27/17  Yes Wendie Agreste, MD  polyethylene glycol [facility] / GLYCOLAX) packet Take 17 g by mouth daily as needed for moderate constipation.    Yes [provider]  Psyllium (METAMUCIL PO) Take 3 capsules by mouth every evening. Reported on 10/24/2015   Yes [provider]  pyridOXINE (VITAMIN B-6) 100 MG tablet Take 100 mg by mouth every morning.    Yes [provider]  Thiamine HCl (VITAMIN B-1) 250 MG tablet Take 250 mg by mouth every morning.    Yes [provider]  valsartan (DIOVAN) 160 MG tablet Take 160 mg by mouth every morning. Reported on 11/05/2015 11/08/14  Yes [provider]   Social History   Social History  . Marital status: Single    Spouse name: N/A  . Number of children: N/A  . Years of education: N/A   Occupational History  . Not on file.   Social History Main Topics  . Smoking status: Former Smoker    Packs/day: 2.00    Years: 45.00    Types: Cigarettes    Quit date: 08/03/2003  . Smokeless tobacco: Current User    Types: Chew     Comment: quit  smoking 10 years agop  . Alcohol use No      Comment: quit 1980  . Drug use: No     Comment: none in past 30 years   . Sexual activity: Not on file   Other Topics Concern  . Not on file   Social History Narrative   ** Merged History Encounter **       Review of Systems  Constitutional: Negative for fatigue and unexpected weight change.  Eyes: Negative for visual disturbance.  Respiratory: Negative for cough, chest tightness and shortness of breath.   Cardiovascular: Negative for chest pain, palpitations and leg swelling.  Gastrointestinal: Negative for abdominal pain and blood in stool.  Skin: Negative for rash and wound.       Cyst on neck  Neurological: Negative for dizziness, light-headedness and headaches.       Objective:   Physical Exam  Constitutional: He is oriented to person, place, and time. He appears well-developed and well-nourished.  HENT:  Head: Normocephalic and atraumatic.  Eyes: Pupils are equal, round, and reactive to light. EOM are normal.  Neck: No JVD present. Carotid bruit is not present.  Neck: induration over right posterior neck in 2 locations, appears to be a sebaceous cyst, inferior lateral aspect of the induration without discharge; fluctuance and softer spots centrally with a dark opening, no exudate or blood expressed; cystic structure measures 1.5cm across but induration is 3.5cm by 2cm  Cardiovascular: Normal rate, regular rhythm and normal heart sounds.   No murmur heard. Pulmonary/Chest: Effort normal and breath sounds normal. He has no rales.  Musculoskeletal: He exhibits no edema.  Neurological: He is alert and oriented to person, place, and time.  Skin: Skin is warm and dry.  Psychiatric: He has a normal mood and affect.  Vitals reviewed.   Vitals:   03/22/17 1057  BP: 125/65  Pulse: (!) 56  Resp: 16  Temp: 97.7 F (36.5 C)  TempSrc: Oral  SpO2: 100%  Weight: 133 lb (60.3 kg)  Height: 5\' 9"  (1.753 m)      Assessment & Plan:   Gilbert [name] is a 66 y.o. male Infected  sebaceous cyst - Plan: doxycycline (VIBRA-TABS) 100 MG tablet, WOUND CULTURE Suspected infected sebaceous cyst versus abscess, posterior neck. His cyst and drained as above and other procedures. Started on doxycycline for possible secondary infection, RTC precautions, handout given on after care.  Meds ordered this encounter  Medications  . doxycycline (VIBRA-TABS)  100 MG tablet    Sig: Take 1 tablet (100 mg total) by mouth 2 (two) times daily.    Dispense:  20 tablet    Refill:  0   Patient Instructions   Start antibiotic for possible infected cyst, wound care as discussed at time of procedure. If any worsening swelling, worsening redness around the wound, fevers, or other worsening symptoms, please return for recheck. Let me know if you have questions.  Home care: Change dressing if dressing is soiled. Keep covered at all times except when showering. You [place] get wet in the shower but do not scrub.  Apply warm compresses/heating pad to facilitate drainage. Leave packing in place. Do not apply any ointments as this [place] delay healing. If an antibiotic was prescribed, take as directed until finished. Return in 48 hours for wound care.   Epidermal Cyst Removal, Care After Refer to this sheet in the next few weeks. These instructions provide you with information about caring for yourself after your procedure. Your health care provider Reeb also give you more specific instructions. Your treatment has been planned according to current medical practices, but problems sometimes occur. Call your health care provider if you have any problems or questions after your procedure. What can I expect after the procedure? After the procedure, it is common to have:  Soreness in the area where your cyst was removed.  Tightness or itching from your skin sutures.  Follow these instructions at home:  Take medicines only as directed by your health care provider.  If you were prescribed an antibiotic  medicine, finish all of it even if you start to feel better.  Use antibiotic ointment as directed by your health care provider. Follow the instructions carefully.  There are many different ways to close and cover an incision, including stitches (sutures), skin glue, and adhesive strips. Follow your health care provider's instructions about: ? Incision care. ? Bandage (dressing) changes and removal. ? Incision closure removal.  Keep the bandage (dressing) dry until your health care provider says that it can be removed. Take sponge baths only. Ask your health care provider when you can start showering or taking a bath.  After your dressing is off, check your incision every day for signs of infection. Watch for: ? Redness, swelling, or pain. ? Fluid, blood, or pus.  You can return to your normal activities. Do not do anything that stretches or puts pressure on your incision.  You can return to your normal diet.  Keep all follow-up visits as directed by your health care provider. This is important. Contact a health care provider if:  You have a fever.  Your incision bleeds.  You have redness, swelling, or pain in the incision area.  You have fluid, blood, or pus coming from your incision.  Your cyst comes back after surgery. This information is not intended to replace advice given to you by your health care provider. Make sure you discuss any questions you have with your health care provider. Document Released: 08/09/2014 Document Revised: 12/25/2015 Document Reviewed: 04/03/2014 Elsevier Interactive Patient Education  2018 [employer].     IF you received an x-ray today, you will receive an invoice from [facility]. Please contact [facility] at [phone] with questions or concerns regarding your invoice.   IF you received labwork today, you will receive an invoice from [place]. Please contact [facility] at [phone] with questions or concerns regarding  your invoice.   Our billing staff will not be able  to assist you with questions regarding bills from these companies.  You will be contacted with the lab results as soon as they are available. The fastest way to get your results is to activate your My Chart account. Instructions are located on the last page of this paperwork. If you have not heard from [place] regarding the results in 2 weeks, please contact this office.       I personally performed the services described in this documentation, which was scribed in my presence. The recorded information has been reviewed and considered for accuracy and completeness, addended by me as needed, and agree with information above.  Signed,   [name], MD [facility].  03/22/17 1:01 PM

## 2017-03-22 NOTE — Progress Notes (Signed)
Procedure: Verbal consent obtained. Skin was cleaned with alcohol and anesthetized with 1 cc lidocaine with epinephrine. A 1 cm incision was made. A moderate amount of purulent material expressed. Wound cavity size estimated to extend about 1 cm in all directions. Wound lightly packed with 1/4 inch packing. Wound dressed and wound care discussed.

## 2017-03-22 NOTE — Patient Instructions (Addendum)
Start antibiotic for possible infected cyst, wound care as discussed at time of procedure. If any worsening swelling, worsening redness around the wound, fevers, or other worsening symptoms, please return for recheck. Let me know if you have questions.  Home care: Change dressing if dressing is soiled. Keep covered at all times except when showering. You [place] get wet in the shower but do not scrub.  Apply warm compresses/heating pad to facilitate drainage. Leave packing in place. Do not apply any ointments as this [place] delay healing. If an antibiotic was prescribed, take as directed until finished. Return in 48 hours for wound care.   Epidermal Cyst Removal, Care After Refer to this sheet in the next few weeks. These instructions provide you with information about caring for yourself after your procedure. Your health care provider Maguire also give you more specific instructions. Your treatment has been planned according to current medical practices, but problems sometimes occur. Call your health care provider if you have any problems or questions after your procedure. What can I expect after the procedure? After the procedure, it is common to have:  Soreness in the area where your cyst was removed.  Tightness or itching from your skin sutures.  Follow these instructions at home:  Take medicines only as directed by your health care provider.  If you were prescribed an antibiotic medicine, finish all of it even if you start to feel better.  Use antibiotic ointment as directed by your health care provider. Follow the instructions carefully.  There are many different ways to close and cover an incision, including stitches (sutures), skin glue, and adhesive strips. Follow your health care provider's instructions about: ? Incision care. ? Bandage (dressing) changes and removal. ? Incision closure removal.  Keep the bandage (dressing) dry until your health care provider says that it can be  removed. Take sponge baths only. Ask your health care provider when you can start showering or taking a bath.  After your dressing is off, check your incision every day for signs of infection. Watch for: ? Redness, swelling, or pain. ? Fluid, blood, or pus.  You can return to your normal activities. Do not do anything that stretches or puts pressure on your incision.  You can return to your normal diet.  Keep all follow-up visits as directed by your health care provider. This is important. Contact a health care provider if:  You have a fever.  Your incision bleeds.  You have redness, swelling, or pain in the incision area.  You have fluid, blood, or pus coming from your incision.  Your cyst comes back after surgery. This information is not intended to replace advice given to you by your health care provider. Make sure you discuss any questions you have with your health care provider. Document Released: 08/09/2014 Document Revised: 12/25/2015 Document Reviewed: 04/03/2014 Elsevier Interactive Patient Education  2018 [employer].     IF you received an x-ray today, you will receive an invoice from [facility]. Please contact [facility] at [phone] with questions or concerns regarding your invoice.   IF you received labwork today, you will receive an invoice from [place]. Please contact [facility] at [phone] with questions or concerns regarding your invoice.   Our billing staff will not be able to assist you with questions regarding bills from these companies.  You will be contacted with the lab results as soon as they are available. The fastest way to get your results is to activate your My Chart account.  Instructions are located on the last page of this paperwork. If you have not heard from [place] regarding the results in 2 weeks, please contact this office.

## 2017-03-24 ENCOUNTER — Encounter: Payer: Self-pay | Admitting: Family Medicine

## 2017-03-24 ENCOUNTER — Ambulatory Visit (INDEPENDENT_AMBULATORY_CARE_PROVIDER_SITE_OTHER): Payer: Medicare Other | Admitting: Family Medicine

## 2017-03-24 VITALS — BP 103/60 | HR 98 | Temp 98.2°F | Resp 16

## 2017-03-24 DIAGNOSIS — L0211 Cutaneous abscess of neck: Secondary | ICD-10-CM

## 2017-03-24 DIAGNOSIS — Z8579 Personal history of other malignant neoplasms of lymphoid, hematopoietic and related tissues: Secondary | ICD-10-CM

## 2017-03-24 DIAGNOSIS — N63 Unspecified lump in unspecified breast: Secondary | ICD-10-CM

## 2017-03-24 NOTE — Patient Instructions (Addendum)
  Packing was removed and replaced a day, leave current packing in place until seen Saturday. Continue antibiotic twice per day.  I am concerned about the area of your left breast and would recommend ultrasound and mammogram for further evaluation. If you change your mind and would like me to order those tests prior to your oncology visit in a few weeks, please let me know. As we discussed that could be a cyst, but could also be a mass or tumor. If you are having any redness, nipple discharge, or worsening symptoms of that area, return right away.    IF you received an x-ray today, you will receive an invoice from [facility]. Please contact [facility] at [phone] with questions or concerns regarding your invoice.   IF you received labwork today, you will receive an invoice from [place]. Please contact [facility] at [phone] with questions or concerns regarding your invoice.   Our billing staff will not be able to assist you with questions regarding bills from these companies.  You will be contacted with the lab results as soon as they are available. The fastest way to get your results is to activate your My Chart account. Instructions are located on the last page of this paperwork. If you have not heard from [place] regarding the results in 2 weeks, please contact this office.

## 2017-03-24 NOTE — Progress Notes (Signed)
Subjective:  By signing my name below, I, Gilbert [name], attest that this documentation has been prepared under the direction and in the presence of Gilbert Ray, MD. Electronically Signed: [name], [place]. 03/24/2017 , 10:11 AM .  Patient was seen in Room 26 .   Patient ID: Gilbert [name], male    DOB: April 16, 1951, 66 y.o.   MRN: [id number] Chief Complaint  Patient presents with  . Follow-up    neck abscess   HPI Gilbert [name] is a 66 y.o. male Here for follow up of neck abscess. He was seen on 03/22/17, I&D was performed. Wound cavity was 1cm and packing was placed. He was started on doxycycline 100mg  BID.   He states his neck has been sore, but not sore enough for him needing to take medication. He notes the area has been itching. He was able to change the bandage yesterday. He denies nausea, abdominal pain or vomiting with the doxycycline.   He noticed a bump on his left breast, below the nipple for past 1 year. He notes it feels more sore in the past week. No nipple discharge or bleeding, no redness. He has an appointment with his oncologist, Dr. Marin [name] in a few weeks; plans to discuss with oncology. He has a history of non-Hodgkin's lymphoma.   Patient Active Problem List   Diagnosis Date Noted  . Diffuse non-Hodgkin's lymphoma of bone (Gilbert [name]) 01/08/2016  . 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 Hematuria 10/29/2015  . Dysphagia, pharyngoesophageal phase   . Early satiety   . Esophageal stricture   . Gastritis and gastroduodenitis   . PAD (peripheral artery disease) ([place]) 05/28/2014  . Cardiomyopathy, ischemic 04/03/2014  . History of left heart catheterization (LHC) 02/26/2014  . Arterial bleed, intraoperative 02/26/2014  . Seizures ([place]) 11/27/2013  . HTN (hypertension) 11/27/2013  . Hypomagnesemia 11/27/2013  . CAD (coronary artery disease), native coronary artery 11/26/2013  . Renal cancer (Gilbert [name])  10/19/2013  . Coronary artery calcification 09/25/2013  . Renal cyst 08/30/2013  . Loss of weight 08/03/2013   Past Medical History:  Diagnosis Date  . Abnormal liver enzymes    Chronic alkaline phosphatase elevation since 2014  . Acid reflux   . CAD (coronary artery disease)   . Cancer ([place]) 08/2013   right kidney cancer  . Cancer (Gilbert [name])   . Coronary artery calcification seen on CAT scan   . Coronary atherosclerosis of native coronary artery   . Diffuse large cell non-Hodgkin's lymphoma ([place]) 01/07/2016  . Diffuse non-Hodgkin's lymphoma of bone ([place]) 01/08/2016  . Essential hypertension, benign   . Family history of anesthesia complication    mother-had stroke during anesthesia  . GERD (gastroesophageal reflux disease)   . Gout   . History of cardiac arrest 11/26/13   PTCA/Stenting of LM & Prox LAD with 4.0 x 18 mm Xience alpine stent. Used Impella Circulatory assist device. EF= 20-25%  . History of epilepsy    at least 10 years ago. 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 History of myocardial infarction less than 8 weeks    Stent placed  . History of nephrectomy 11/2013   For renal cell carcinoma  . History of tobacco use   . HTN (hypertension)   . Hypercholesteremia   . Hyperlipemia   . Hypertension   . PAD (peripheral artery disease) (Gilbert [name])   . Seizures (Gilbert [name])   . Shortness of breath    with activity  . SOB (  shortness of breath)   . Systolic and diastolic CHF, chronic (HCC)    Resolved. EF 45% 04/10/2014  . Therapeutic drug monitoring    Past Surgical History:  Procedure Laterality Date  . BALLOON DILATION N/A 01/21/2015   Procedure: BALLOON DILATION;  Surgeon: Gatha Mayer, MD;  Location[facility] ENDOSCOPY;  Service: Endoscopy;  Laterality: N/A;  . CARDIAC CATHETERIZATION N/A 06/10/2015   Procedure: Left Heart Cath and Coronary Angiography;  Surgeon: Adrian Prows, MD;  Location: [place] CV LAB;  Service: Cardiovascular;  Laterality: N/A;  . CORONARY ANGIOPLASTY WITH STENT  PLACEMENT  2015   Left main coronary artery stenting on emergent basis along with circulatory support with Impella device through left leg. With 4.0 x 18 mm Xience alpine stent  . ESOPHAGOGASTRODUODENOSCOPY    . ESOPHAGOGASTRODUODENOSCOPY N/A 01/21/2015   Procedure: ESOPHAGOGASTRODUODENOSCOPY (EGD) with possible dilation.;  Surgeon: Gatha Mayer, MD;  Location: [facility] ENDOSCOPY;  Service: Endoscopy;  Laterality: N/A;  . EYE SURGERY Left 2 weeks ago   torn retina  . ILIAC ARTERY STENT Left 05/28/2014   dr Einar Gip  . LEFT HEART CATHETERIZATION WITH CORONARY ANGIOGRAM N/A 11/27/2013   Procedure: LEFT HEART CATHETERIZATION WITH CORONARY ANGIOGRAM;  Surgeon: [name], MD;  Location: [facility] CATH LAB;  Service: Cardiovascular;  Laterality: N/A;  . LOWER EXTREMITY ANGIOGRAM N/A 02/26/2014   Procedure: LOWER EXTREMITY ANGIOGRAM;  Surgeon: [name], MD;  Location[facility] C[facility] LAB;  Service: Cardiovascular;  Laterality: N/A;  . LOWER EXTREMITY ANGIOGRAM N/A 05/28/2014   Procedure: LOWER EXTREMITY ANGIOGRAM;  Surgeon: [name], MD;  Location: [facility] CATH LAB;  Service: Cardiovascular;  Laterality: N/A;  . NEPHRECTOMY  11/2013  . NO PAST SURGERIES    . PERCUTANEOUS CORONARY STENT INTERVENTION (PCI-S)  11/27/2013   Procedure: PERCUTANEOUS CORONARY STENT INTERVENTION (PCI-S);  Surgeon: [name], MD;  Location: [facility] CATH LAB;  Service: Cardiovascular;;  . ROBOT ASSISTED LAPAROSCOPIC NEPHRECTOMY Right 10/19/2013   Procedure: ROBOTIC ASSISTED LAPAROSCOPIC NEPHRECTOMY,  EXTENSIVE ADHESIOLYSIS;  Surgeon: Alexis Frock, MD;  Location: [facility] ORS;  Service: Urology;  Laterality: Right;   Allergies  Allergen Reactions  . Oxycodone Swelling and Other (See Comments)    Tongue and lip   Prior to Admission medications   Medication Sig Start Date End Date Taking? Authorizing Provider  amLODipine (NORVASC) 10 MG tablet Take 10 mg by mouth daily.    [provider]  aspirin EC 81 MG tablet Take 1  tablet (81 mg total) by mouth daily. Patient taking differently: Take 81 mg by mouth every morning.  12/11/13   Angiulli, Lavon Paganini, PA-C  atorvastatin (LIPITOR) 80 MG tablet Take 80 mg by mouth daily. 09/05/15   [provider]  carvedilol (COREG) 12.5 MG tablet Take 12.5 mg by mouth 2 (two) times daily with a meal.  11/13/14   [provider]  chlorthalidone (HYGROTON) 25 MG tablet  12/23/16   [provider]  clopidogrel (PLAVIX) 75 MG tablet Take 75 mg by mouth daily.  10/31/15   [provider]  Cyanocobalamin (B-12) 5000 MCG CAPS Take 5,000 mcg by mouth every morning.     [provider]  doxycycline (VIBRA-TABS) 100 MG tablet Take 1 tablet (100 mg total) by mouth 2 (two) times daily. 03/22/17   Wendie Agreste, MD  lidocaine-prilocaine (EMLA) cream Apply 1 application topically daily as needed (port access). Apply to affected area once 01/06/17   Volanda Napoleon, MD  pantoprazole (PROTONIX) 20 MG tablet  Take 1 tablet (20 mg total) by mouth daily. 02/19/16   Lacretia Leigh, MD  phenytoin (DILANTIN) 300 MG ER capsule Take 1 capsule (300 mg total) by mouth every morning. 01/27/17   Wendie Agreste, MD  polyethylene glycol [facility] / Floria Raveling) packet Take 17 g by mouth daily as needed for moderate constipation.     [provider]  Psyllium (METAMUCIL PO) Take 3 capsules by mouth every evening. Reported on 10/24/2015    [provider]  pyridOXINE (VITAMIN B-6) 100 MG tablet Take 100 mg by mouth every morning.     [provider]  Thiamine HCl (VITAMIN B-1) 250 MG tablet Take 250 mg by mouth every morning.     [provider]  valsartan (DIOVAN) 160 MG tablet Take 160 mg by mouth every morning. Reported on 11/05/2015 11/08/14   [provider]   Social History   Social History  . Marital status: Single    Spouse name: N/A  . Number of children: N/A  . Years of education: N/A   Occupational History  . Not on  file.   Social History Main Topics  . Smoking status: Former Smoker    Packs/day: 2.00    Years: 45.00    Types: Cigarettes    Quit date: 08/03/2003  . Smokeless tobacco: Current User    Types: Chew     Comment: quit  smoking 10 years agop  . Alcohol use No     Comment: quit 1980  . Drug use: No     Comment: none in past 30 years   . Sexual activity: Not on file   Other Topics Concern  . Not on file   Social History Narrative   ** Merged History Encounter **       Review of Systems  Constitutional: Negative for fatigue, fever and unexpected weight change.  Eyes: Negative for visual disturbance.  Respiratory: Negative for cough, chest tightness and shortness of breath.   Cardiovascular: Negative for chest pain, palpitations and leg swelling.  Gastrointestinal: Negative for abdominal pain, [name] in stool, diarrhea, nausea and vomiting.  Skin: Positive for wound. Negative for rash.  Neurological: Negative for dizziness, light-headedness and headaches.       Objective:   Physical Exam  Constitutional: He is oriented to person, place, and time. He appears well-developed and well-nourished. No distress.  HENT:  Head: Normocephalic and atraumatic.  Eyes: Pupils are equal, round, and reactive to light. EOM are normal.  Neck: Neck supple.  Slight induration posterior neck just superior to the wound, able to express some clear-white exudate superior to the packing; packing removed without difficulty, still able to express some sebaceous material with pus, no apparent bleeding, no surrounding erythema; wound measured approximately 21mm  Cardiovascular: Normal rate.   Pulmonary/Chest: Effort normal. No respiratory distress.    Musculoskeletal: Normal range of motion.  Neurological: He is alert and oriented to person, place, and time.  Skin: Skin is warm and dry.  Psychiatric: He has a normal mood and affect. His behavior is normal.  Nursing note and vitals reviewed.   Vitals:     03/24/17 0949  BP: 103/60  Pulse: 98  Resp: 16  Temp: 98.2 F (36.8 C)  TempSrc: Oral  SpO2: 100%   Procedure note: lidocaine 1% Applied to the wound, approximately 0.5cc flushed, 1-2cm of plain packing was placed without difficulty.      Assessment & Plan:  Fadi Galambos is a 66 y.o. male Abscess, neck  -  Packing removed, some sebaceous material also expressed and removed. Replaced packing, continue doxycycline and wound recheck in 2 days  History of lymphoma, Mass of breast  - Abnormal area below left nipple/left breast was mentioned at the end of the visit. With history of lymphoma, recommended we obtain ultrasound or mammogram of that area for further evaluation. He does have an upcoming appointment with his oncologist and would prefer to wait to discuss that abnormal area and plan at that time. Advised let me know if he changes his mind sooner and I can arrange imaging.   No orders of the defined types were placed in this encounter.  Patient Instructions    Packing was removed and replaced a day, leave current packing in place until seen Saturday. Continue antibiotic twice per day.  I am concerned about the area of your left breast and would recommend ultrasound and mammogram for further evaluation. If you change your mind and would like me to order those tests prior to your oncology visit in a few weeks, please let me know. As we discussed that could be a cyst, but could also be a mass or tumor. If you are having any redness, nipple discharge, or worsening symptoms of that area, return right away.    IF you received an x-[name] today, you will receive an invoice from [facility]. Please contact [facility] at [phone] with questions or concerns regarding your invoice.   IF you received labwork today, you will receive an invoice from [facility]. Please contact [facility] at [phone] with questions or concerns regarding your invoice.   Our billing staff  will not be able to assist you with questions regarding bills from these companies.  You will be contacted with the lab results as soon as they are available. The fastest way to get your results is to activate your My Chart account. Instructions are located on the last page of this paperwork. If you have not heard from [place] regarding the results in 2 weeks, please contact this office.       I personally performed the services described in this documentation, which was scribed in my presence. The recorded information has been reviewed and considered for accuracy and completeness, addended by me as needed, and agree with information above.  Signed,   Gilbert Ray, MD [facility].  03/24/17 1:14 PM

## 2017-03-25 LAB — WOUND CULTURE: ORGANISM ID, BACTERIA: NONE SEEN

## 2017-03-26 ENCOUNTER — Ambulatory Visit (INDEPENDENT_AMBULATORY_CARE_PROVIDER_SITE_OTHER): Payer: Medicare Other | Admitting: Physician Assistant

## 2017-03-26 ENCOUNTER — Encounter: Payer: Self-pay | Admitting: Physician Assistant

## 2017-03-26 VITALS — BP 126/84 | HR 60 | Resp 18 | Ht 69.0 in | Wt 134.4 lb

## 2017-03-26 DIAGNOSIS — L0211 Cutaneous abscess of neck: Secondary | ICD-10-CM

## 2017-03-26 NOTE — Progress Notes (Signed)
   Patient: Gilbert [name] [id number]  Subjective: Gilbert [name] is returning for packing removal. Patient was initially seen on 03/22/17 for I&D of infected sebaceous cyst. Was given Rx for doxycycline. Returned on 03/24/17 for repacking. Wound culture with no growth in 36-48 hours. Denies fever, drainage of pus or blood, edema, pain.   Objective: Physical Exam  Constitutional: He is oriented to person, place, and time and well-developed, well-nourished, and in no distress.  HENT:  Head: Normocephalic and atraumatic.  Eyes: Conjunctivae are normal.  Neck: Normal range of motion.    Pulmonary/Chest: Effort normal.  Neurological: He is alert and oriented to person, place, and time. Gait normal.  Skin: Skin is warm and dry.  Psychiatric: Affect normal.  Vitals reviewed.  Procedure Note: Wound cavity cleansed with 3 mL of 2% lidocaine. Wound cavity depth measures about 0.5 cm. Wound lightly repacked. Bandaid applied.    Assessment and Plan: 1. Abscess, neck Well-healing wound. Patient expresses that he does not want to come back for further packing removal as he lives 30 miles away. I have informed him that his daughter can remove the packing in 2 days, as very minimal packing was placed today. I recommend he continue to use a Band-Aid over the area until the wound is completely healed once packing is removed.  Return to clinic as needed.  Tenna Delaine, PA-C  [facility] 03/26/2017 10:23 AM

## 2017-03-28 ENCOUNTER — Ambulatory Visit: Payer: Medicare Other | Admitting: Internal Medicine

## 2017-03-30 ENCOUNTER — Encounter: Payer: Self-pay | Admitting: Family Medicine

## 2017-03-30 DIAGNOSIS — I255 Ischemic cardiomyopathy: Secondary | ICD-10-CM | POA: Diagnosis not present

## 2017-04-07 ENCOUNTER — Emergency Department (HOSPITAL_COMMUNITY): Payer: Medicare Other

## 2017-04-07 ENCOUNTER — Observation Stay (HOSPITAL_COMMUNITY)
Admission: EM | Admit: 2017-04-07 | Discharge: 2017-04-08 | Disposition: A | Discharge status: Home / Self Care | Payer: Medicare Other | Attending: Family Medicine | Admitting: Family Medicine

## 2017-04-07 ENCOUNTER — Encounter (HOSPITAL_COMMUNITY): Payer: Self-pay | Admitting: Emergency Medicine

## 2017-04-07 DIAGNOSIS — I951 Orthostatic hypotension: Secondary | ICD-10-CM | POA: Insufficient documentation

## 2017-04-07 DIAGNOSIS — I252 Old myocardial infarction: Secondary | ICD-10-CM | POA: Insufficient documentation

## 2017-04-07 DIAGNOSIS — I739 Peripheral vascular disease, unspecified: Secondary | ICD-10-CM | POA: Insufficient documentation

## 2017-04-07 DIAGNOSIS — C8330 Diffuse large B-cell lymphoma, unspecified site: Secondary | ICD-10-CM | POA: Insufficient documentation

## 2017-04-07 DIAGNOSIS — K219 Gastro-esophageal reflux disease without esophagitis: Secondary | ICD-10-CM | POA: Diagnosis not present

## 2017-04-07 DIAGNOSIS — I130 Hypertensive heart and chronic kidney disease with heart failure and stage 1 through stage 4 chronic kidney disease, or unspecified chronic kidney disease: Secondary | ICD-10-CM | POA: Diagnosis not present

## 2017-04-07 DIAGNOSIS — Z905 Acquired absence of kidney: Secondary | ICD-10-CM | POA: Diagnosis not present

## 2017-04-07 DIAGNOSIS — I255 Ischemic cardiomyopathy: Secondary | ICD-10-CM | POA: Diagnosis not present

## 2017-04-07 DIAGNOSIS — Z720 Tobacco use: Secondary | ICD-10-CM | POA: Diagnosis not present

## 2017-04-07 DIAGNOSIS — Z85528 Personal history of other malignant neoplasm of kidney: Secondary | ICD-10-CM | POA: Diagnosis not present

## 2017-04-07 DIAGNOSIS — I10 Essential (primary) hypertension: Secondary | ICD-10-CM | POA: Diagnosis not present

## 2017-04-07 DIAGNOSIS — Z7982 Long term (current) use of aspirin: Secondary | ICD-10-CM | POA: Diagnosis not present

## 2017-04-07 DIAGNOSIS — E7800 Pure hypercholesterolemia, unspecified: Secondary | ICD-10-CM | POA: Diagnosis not present

## 2017-04-07 DIAGNOSIS — I5042 Chronic combined systolic (congestive) and diastolic (congestive) heart failure: Secondary | ICD-10-CM | POA: Diagnosis not present

## 2017-04-07 DIAGNOSIS — R571 Hypovolemic shock: Secondary | ICD-10-CM | POA: Diagnosis not present

## 2017-04-07 DIAGNOSIS — N179 Acute kidney failure, unspecified: Principal | ICD-10-CM | POA: Insufficient documentation

## 2017-04-07 DIAGNOSIS — R404 Transient alteration of awareness: Secondary | ICD-10-CM | POA: Diagnosis not present

## 2017-04-07 DIAGNOSIS — E860 Dehydration: Secondary | ICD-10-CM | POA: Diagnosis not present

## 2017-04-07 DIAGNOSIS — Z955 Presence of coronary angioplasty implant and graft: Secondary | ICD-10-CM | POA: Insufficient documentation

## 2017-04-07 DIAGNOSIS — M109 Gout, unspecified: Secondary | ICD-10-CM | POA: Diagnosis not present

## 2017-04-07 DIAGNOSIS — G40909 Epilepsy, unspecified, not intractable, without status epilepticus: Secondary | ICD-10-CM | POA: Insufficient documentation

## 2017-04-07 DIAGNOSIS — R42 Dizziness and giddiness: Secondary | ICD-10-CM | POA: Diagnosis not present

## 2017-04-07 DIAGNOSIS — N184 Chronic kidney disease, stage 4 (severe): Secondary | ICD-10-CM | POA: Diagnosis not present

## 2017-04-07 DIAGNOSIS — Z7902 Long term (current) use of antithrombotics/antiplatelets: Secondary | ICD-10-CM | POA: Insufficient documentation

## 2017-04-07 DIAGNOSIS — I2510 Atherosclerotic heart disease of native coronary artery without angina pectoris: Secondary | ICD-10-CM | POA: Diagnosis not present

## 2017-04-07 DIAGNOSIS — C8589 Other specified types of non-Hodgkin lymphoma, extranodal and solid organ sites: Secondary | ICD-10-CM | POA: Diagnosis present

## 2017-04-07 LAB — COMPREHENSIVE METABOLIC PANEL
ALBUMIN: 2.9 g/dL — AB (ref 3.5–5.0)
ALK PHOS: 114 U/L (ref 38–126)
ALT: 14 U/L — ABNORMAL LOW (ref 17–63)
ANION GAP: 8 (ref 5–15)
AST: 22 U/L (ref 15–41)
BILIRUBIN TOTAL: 0.6 mg/dL (ref 0.3–1.2)
BUN: 61 mg/dL — AB (ref 6–20)
CALCIUM: 8.1 mg/dL — AB (ref 8.9–10.3)
CO2: 23 mmol/L (ref 22–32)
Chloride: 102 mmol/L (ref 101–111)
Creatinine, Ser: 2.31 mg/dL — ABNORMAL HIGH (ref 0.61–1.24)
GFR calc Af Amer: 32 mL/min — ABNORMAL LOW (ref >60)
GFR calc non Af Amer: 28 mL/min — ABNORMAL LOW (ref >60)
GLUCOSE: 112 mg/dL — AB (ref 65–99)
Potassium: 4 mmol/L (ref 3.5–5.1)
Sodium: 133 mmol/L — ABNORMAL LOW (ref 135–145)
TOTAL PROTEIN: 5.3 g/dL — AB (ref 6.5–8.1)

## 2017-04-07 LAB — URINALYSIS, ROUTINE W REFLEX MICROSCOPIC
BILIRUBIN URINE: NEGATIVE
GLUCOSE, UA: NEGATIVE mg/dL
HGB URINE DIPSTICK: NEGATIVE
KETONES UR: NEGATIVE mg/dL
Leukocytes, UA: NEGATIVE
NITRITE: NEGATIVE
PH: 5 (ref 5.0–8.0)
Protein, ur: NEGATIVE mg/dL
Specific Gravity, Urine: 1.011 (ref 1.005–1.030)

## 2017-04-07 LAB — CBC WITH DIFFERENTIAL/PLATELET
BASOS PCT: 0 %
Basophils Absolute: 0 10*3/uL (ref 0.0–0.1)
Eosinophils Absolute: 0.4 10*3/uL (ref 0.0–0.7)
Eosinophils Relative: 7 %
HEMATOCRIT: 32.3 % — AB (ref 39.0–52.0)
HEMOGLOBIN: 11.1 g/dL — AB (ref 13.0–17.0)
LYMPHS ABS: 1.3 10*3/uL (ref 0.7–4.0)
LYMPHS PCT: 19 %
MCH: 31.3 pg (ref 26.0–34.0)
MCHC: 34.4 g/dL (ref 30.0–36.0)
MCV: 91 fL (ref 78.0–100.0)
MONOS PCT: 9 %
Monocytes Absolute: 0.6 10*3/uL (ref 0.1–1.0)
NEUTROS ABS: 4.4 10*3/uL (ref 1.7–7.7)
NEUTROS PCT: 66 %
Platelets: 105 10*3/uL — ABNORMAL LOW (ref 150–400)
RBC: 3.55 MIL/uL — ABNORMAL LOW (ref 4.22–5.81)
RDW: 13.8 % (ref 11.5–15.5)
WBC: 6.6 10*3/uL (ref 4.0–10.5)

## 2017-04-07 LAB — I-STAT CG4 LACTIC ACID, ED: LACTIC ACID, VENOUS: 0.69 mmol/L (ref 0.5–1.9)

## 2017-04-07 LAB — LIPASE, BLOOD: Lipase: 94 U/L — ABNORMAL HIGH (ref 11–51)

## 2017-04-07 LAB — I-STAT TROPONIN, ED: Troponin i, poc: 0 ng/mL (ref 0.00–0.08)

## 2017-04-07 LAB — MAGNESIUM: Magnesium: 2.3 mg/dL (ref 1.7–2.4)

## 2017-04-07 MED ORDER — ATORVASTATIN CALCIUM 80 MG PO TABS
80.0000 mg | ORAL_TABLET | Freq: Every day | ORAL | Status: DC
  Administered 2017-04-08: 80 mg via ORAL
  Filled 2017-04-07: qty 1

## 2017-04-07 MED ORDER — ASPIRIN EC 81 MG PO TBEC
81.0000 mg | DELAYED_RELEASE_TABLET | Freq: Every morning | ORAL | Status: DC
  Administered 2017-04-08: 81 mg via ORAL
  Filled 2017-04-07: qty 1

## 2017-04-07 MED ORDER — CALCIUM CARBONATE ANTACID 500 MG PO CHEW
1.0000 | CHEWABLE_TABLET | Freq: Once | ORAL | Status: AC
  Administered 2017-04-07: 200 mg via ORAL
  Filled 2017-04-07: qty 1

## 2017-04-07 MED ORDER — PHENYTOIN SODIUM EXTENDED 100 MG PO CAPS
300.0000 mg | ORAL_CAPSULE | Freq: Every morning | ORAL | Status: DC
  Administered 2017-04-08: 300 mg via ORAL
  Filled 2017-04-07: qty 3

## 2017-04-07 MED ORDER — VITAMIN B-6 100 MG PO TABS
100.0000 mg | ORAL_TABLET | Freq: Every morning | ORAL | Status: DC
  Administered 2017-04-08: 100 mg via ORAL
  Filled 2017-04-07: qty 1

## 2017-04-07 MED ORDER — SODIUM CHLORIDE 0.9 % IV BOLUS (SEPSIS)
500.0000 mL | Freq: Once | INTRAVENOUS | Status: AC
  Administered 2017-04-07: 500 mL via INTRAVENOUS

## 2017-04-07 MED ORDER — VITAMIN B-12 1000 MCG PO TABS
5000.0000 ug | ORAL_TABLET | Freq: Every morning | ORAL | Status: DC
  Administered 2017-04-08: 5000 ug via ORAL
  Filled 2017-04-07: qty 5

## 2017-04-07 MED ORDER — ONDANSETRON HCL 4 MG PO TABS: 4.0000 mg | ORAL_TABLET | Freq: Four times a day (QID) | ORAL | Status: DC | PRN

## 2017-04-07 MED ORDER — ACETAMINOPHEN 325 MG PO TABS: 650.0000 mg | ORAL_TABLET | Freq: Four times a day (QID) | ORAL | Status: DC | PRN

## 2017-04-07 MED ORDER — ACETAMINOPHEN 650 MG RE SUPP
650.0000 mg | Freq: Four times a day (QID) | RECTAL | Status: DC | PRN
Start: 2017-04-07 — End: 2017-04-08

## 2017-04-07 MED ORDER — LIDOCAINE-PRILOCAINE 2.5-2.5 % EX CREA: 1.0000 "application " | TOPICAL_CREAM | Freq: Every day | CUTANEOUS | Status: DC | PRN

## 2017-04-07 MED ORDER — CALCIUM CARBONATE ANTACID 500 MG PO CHEW
1.0000 | CHEWABLE_TABLET | ORAL | Status: DC | PRN
  Administered 2017-04-08: 200 mg via ORAL
  Filled 2017-04-07: qty 1

## 2017-04-07 MED ORDER — SODIUM CHLORIDE 0.9 % IV SOLN
Freq: Once | INTRAVENOUS | Status: AC
  Administered 2017-04-07: 18:00:00 via INTRAVENOUS

## 2017-04-07 MED ORDER — CLOPIDOGREL BISULFATE 75 MG PO TABS
75.0000 mg | ORAL_TABLET | Freq: Every day | ORAL | Status: DC
  Administered 2017-04-08: 75 mg via ORAL
  Filled 2017-04-07: qty 1

## 2017-04-07 MED ORDER — HEPARIN SODIUM (PORCINE) 5000 UNIT/ML IJ SOLN
5000.0000 [IU] | Freq: Three times a day (TID) | INTRAMUSCULAR | Status: DC
  Filled 2017-04-07: qty 1

## 2017-04-07 MED ORDER — VITAMIN B-1 100 MG PO TABS
250.0000 mg | ORAL_TABLET | Freq: Every morning | ORAL | Status: DC
  Administered 2017-04-08: 250 mg via ORAL
  Filled 2017-04-07: qty 3

## 2017-04-07 MED ORDER — ONDANSETRON HCL 4 MG/2ML IJ SOLN: 4.0000 mg | Freq: Four times a day (QID) | INTRAMUSCULAR | Status: DC | PRN

## 2017-04-07 NOTE — Consult Note (Signed)
Gilbert [name] is an 66 y.o. male.   Chief Complaint: Dizziness HPI: Gilbert [name]  is a 66 y.o. male  With  He has h/o myocardial infarction (h/o nephrectomy in [place] 2015 for renal cell carcinoma. He was found unresponsive in recliner 11/26/2013 with unknown down time, underwent CPR, patient in multiorgan failure, underwent left main coronary artery PTCA with 4.0 x 18 mm Xience alpine stent. ), dyslipidemia and peripheral vascular disease (Occluded left iliac artery after Impella Device used for circulatory assist during LM stenting. Underwent Peripheral arteriogram 05/28/2014: Left external/common iliac artery stenting 6.0 x 120 mm, 8.0 x 80 mm Absolute Pro self-expanding stent.  Patient underwent coronary angiography on 06/10/2015 due to new onset of congestive heart failure, angiography revealing occluded right coronary artery and distal circumflex coronary artery. Medical therapy was recommended due to complexity of coronary disease.  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 Patient has not been feeling well, feeling dizzy and has been ill for the past one week, for the last 2 days has not left the house. He was seen in our office this afternoon and recommended admission to the hospital due to clinical suspicion for severe dehydration. No chest pain except chest wall pain that he has been having for the past several weeks at the left breast area. No PND or orthopnea. No dyspnea. No leg edema. Daughter present.   Past Medical History:  Diagnosis Date  . Abnormal liver enzymes    Chronic alkaline phosphatase elevation since 2014  . Acid reflux   . CAD (coronary artery disease)   . Cancer ([place]) 08/2013   right kidney cancer  . Cancer ([place])   . Coronary artery calcification seen on CAT scan   . Coronary atherosclerosis of native coronary artery   . 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 Essential hypertension, benign   . Family history of anesthesia complication    mother-had stroke during anesthesia  . GERD (gastroesophageal reflux disease)   . Gout   . History of cardiac arrest 11/26/13   PTCA/Stenting of LM & Prox LAD with 4.0 x 18 mm Xience alpine stent. Used Impella Circulatory assist device. EF= 20-25%  . History of epilepsy    at least 10 years ago. 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 History of myocardial infarction less than 8 weeks    Stent placed  . History of nephrectomy 11/2013   For renal cell carcinoma  . History of tobacco use   . HTN (hypertension)   . Hypercholesteremia   . Hyperlipemia   . Hypertension   . PAD (peripheral artery disease) ([place])   . Seizures ([place])   . Shortness of breath    with activity  . SOB (shortness of breath)   . Systolic and diastolic CHF, chronic (HCC)    Resolved. EF 45% 04/10/2014  . Therapeutic drug monitoring     Past Surgical History:  Procedure Laterality Date  . BALLOON DILATION N/A 01/21/2015   Procedure: BALLOON DILATION;  Surgeon: Gatha Mayer, MD;  Location: [facility] ENDOSCOPY;  Service: Endoscopy;  Laterality: N/A;  . CARDIAC CATHETERIZATION N/A 06/10/2015   Procedure: Left Heart Cath and Coronary Angiography;  Surgeon: Adrian Prows, MD;  Location: [facility] CV LAB;  Service: Cardiovascular;  Laterality: N/A;  . CORONARY ANGIOPLASTY WITH STENT PLACEMENT  2015  Left main coronary artery stenting on emergent basis along with circulatory support with Impella device through left leg. With 4.0 x 18 mm Xience alpine stent  . ESOPHAGOGASTRODUODENOSCOPY    . ESOPHAGOGASTRODUODENOSCOPY N/A 01/21/2015   Procedure: ESOPHAGOGASTRODUODENOSCOPY (EGD) with possible dilation.;  Surgeon: Gatha Mayer, MD;  Location: [facility] ENDOSCOPY;  Service: Endoscopy;  Laterality: N/A;  . EYE SURGERY Left 2 weeks ago   torn retina  . ILIAC ARTERY STENT Left 05/28/2014   dr Einar Gip  .  LEFT HEART CATHETERIZATION WITH CORONARY ANGIOGRAM N/A 11/27/2013   Procedure: LEFT HEART CATHETERIZATION WITH CORONARY ANGIOGRAM;  Surgeon: [name], MD;  Location: [facility] CATH LAB;  Service: Cardiovascular;  Laterality: N/A;  . LOWER EXTREMITY ANGIOGRAM N/A 02/26/2014   Procedure: LOWER EXTREMITY ANGIOGRAM;  Surgeon: [name], MD;  Location: [facility] CATH LAB;  Service: Cardiovascular;  Laterality: N/A;  . LOWER EXTREMITY ANGIOGRAM N/A 05/28/2014   Procedure: LOWER EXTREMITY ANGIOGRAM;  Surgeon: [name], MD;  Location: [facility] CATH LAB;  Service: Cardiovascular;  Laterality: N/A;  . NEPHRECTOMY  11/2013  . NO PAST SURGERIES    . PERCUTANEOUS CORONARY STENT INTERVENTION (PCI-S)  11/27/2013   Procedure: PERCUTANEOUS CORONARY STENT INTERVENTION (PCI-S);  Surgeon: [name], MD;  Location: [facility] CATH LAB;  Service: Cardiovascular;;  . ROBOT ASSISTED LAPAROSCOPIC NEPHRECTOMY Right 10/19/2013   Procedure: ROBOTIC ASSISTED LAPAROSCOPIC NEPHRECTOMY,  EXTENSIVE ADHESIOLYSIS;  Surgeon: Alexis Frock, MD;  Location: [facility] ORS;  Service: Urology;  Laterality: Right;    Family History  Problem Relation Age of Onset  . Stroke Mother   . Heart disease Brother        multiple stents  . Hyperlipidemia Brother   . Emphysema Father    Social History:  reports that he quit smoking about 13 years ago. His smoking use included Cigarettes. He has a 90.00 pack-year smoking history. His smokeless tobacco use includes Chew. He reports that he does not drink alcohol or use drugs.  Allergies:  Allergies  Allergen Reactions  . Oxycodone Swelling and Other (See Comments)    Tongue and lip    Review of Systems - General Present- Tiredness. Not Present- Chills, Fever and Night Sweats. Skin Not Present- Itching and Rash. HEENT Not Present- Headache. Respiratory Not Present- Cough, Decreased Exercise Tolerance and Difficulty Breathing on Exertion. Cardiovascular Present- Chest Pain (chest wall pain)  and Difficulty Breathing On Exertion (mild and stable). Not Present- Claudications, Difficulty Breathing Lying Down, Fainting, Irregular Heart Beat and Swelling of Extremities. Gastrointestinal Not Present- Abdominal Pain, Belching, Bloody Stool, Dysphagia, Nausea and Vomiting. Musculoskeletal Present- Joint Pain (left shoulder. Received steroid injection 05/04/2015, symptoms improved). Neurological Present- Dizziness and Tingling (feet). Not Present- Headaches. Hematology Not Present- Blood Clots, Easy Bruising and Nose Bleed. Other systems negative   Blood pressure (!) 112/58, pulse (!) 50, temperature 97.9 F (36.6 C), temperature source Oral, resp. rate 18, SpO2 99 %. There is no height or weight on file to calculate BMI.  General appearance: alert, cooperative, appears stated age, fatigued and no distress Eyes: negative findings: lids and lashes normal Neck: no adenopathy, no JVD, supple, symmetrical, trachea midline and thyroid not enlarged, symmetric, no tenderness/mass/nodules Neck: JVP - normal, carotids 2+= without bruits Resp: clear to auscultation bilaterally Chest wall: left breast tenderness noted. No discharge Cardio: regular rate and rhythm, S1, S2 normal, no murmur, click, rub or gallop and distant heart sounds GI: soft, non-tender; bowel sounds normal; no masses,  no organomegaly and wasted and emaciated Extremities:  extremities normal, atraumatic, no cyanosis or edema Pulses: Bilateral carotid bruit present.  Febrile pulses upper extremity and lower extremity.  Pedal pulses right trace. Skin: reduced skin turgor Neurologic: Grossly normal  Results for orders placed or performed during the hospital encounter of 04/07/17 (from the past 48 hour(s))  CBC with Differential     Status: Abnormal   Collection Time: 04/07/17  4:44 PM  Result Value Ref Range   WBC 6.6 4.0 - 10.5 K/uL   RBC 3.55 (L) 4.22 - 5.81 MIL/uL   Hemoglobin 11.1 (L) 13.0 - 17.0 g/dL   HCT 32.3 (L) 39.0 -  52.0 %   MCV 91.0 78.0 - 100.0 fL   MCH 31.3 26.0 - 34.0 pg   MCHC 34.4 30.0 - 36.0 g/dL   RDW 13.8 11.5 - 15.5 %   Platelets 105 (L) 150 - 400 K/uL    Comment: REPEATED TO VERIFY SPECIMEN CHECKED FOR CLOTS PLATELET COUNT CONFIRMED BY SMEAR    Neutrophils Relative % 66 %   Neutro Abs 4.4 1.7 - 7.7 K/uL   Lymphocytes Relative 19 %   Lymphs Abs 1.3 0.7 - 4.0 K/uL   Monocytes Relative 9 %   Monocytes Absolute 0.6 0.1 - 1.0 K/uL   Eosinophils Relative 7 %   Eosinophils Absolute 0.4 0.0 - 0.7 K/uL   Basophils Relative 0 %   Basophils Absolute 0.0 0.0 - 0.1 K/uL  Comprehensive metabolic panel     Status: Abnormal   Collection Time: 04/07/17  4:44 PM  Result Value Ref Range   Sodium 133 (L) 135 - 145 mmol/L   Potassium 4.0 3.5 - 5.1 mmol/L   Chloride 102 101 - 111 mmol/L   CO2 23 22 - 32 mmol/L   Glucose, Bld 112 (H) 65 - 99 mg/dL   BUN 61 (H) 6 - 20 mg/dL   Creatinine, Ser 2.31 (H) 0.61 - 1.24 mg/dL   Calcium 8.1 (L) 8.9 - 10.3 mg/dL   Total Protein 5.3 (L) 6.5 - 8.1 g/dL   Albumin 2.9 (L) 3.5 - 5.0 g/dL   AST 22 15 - 41 U/L   ALT 14 (L) 17 - 63 U/L   Alkaline Phosphatase 114 38 - 126 U/L   Total Bilirubin 0.6 0.3 - 1.2 mg/dL   GFR calc non Af Amer 28 (L) >60 mL/min   GFR calc Af Amer 32 (L) >60 mL/min    Comment: (NOTE) The eGFR has been calculated using the CKD EPI equation. This calculation has not been validated in all clinical situations. eGFR's persistently <60 mL/min signify possible Chronic Kidney Disease.    Anion gap 8 5 - 15  Magnesium     Status: None   Collection Time: 04/07/17  4:44 PM  Result Value Ref Range   Magnesium 2.3 1.7 - 2.4 mg/dL  Lipase, blood     Status: Abnormal   Collection Time: 04/07/17  4:44 PM  Result Value Ref Range   Lipase 94 (H) 11 - 51 U/L  I-Stat Troponin, ED (not at [facility])     Status: None   Collection Time: 04/07/17  4:59 PM  Result Value Ref Range   Troponin i, poc 0.00 0.00 - 0.08 ng/mL   Comment 3            Comment: Due  to the release kinetics of cTnI, a negative result within the first hours of the onset of symptoms does not rule out myocardial infarction with certainty. If myocardial infarction is still  suspected, repeat the test at appropriate intervals.   I-Stat CG4 Lactic Acid, ED     Status: None   Collection Time: 04/07/17  5:02 PM  Result Value Ref Range   Lactic Acid, Venous 0.69 0.5 - 1.9 mmol/L  Urinalysis, Routine w reflex microscopic     Status: None   Collection Time: 04/07/17  9:33 PM  Result Value Ref Range   Color, Urine YELLOW YELLOW   APPearance CLEAR CLEAR   Specific Gravity, Urine 1.011 1.005 - 1.030   pH 5.0 5.0 - 8.0   Glucose, UA NEGATIVE NEGATIVE mg/dL   Hgb urine dipstick NEGATIVE NEGATIVE   Bilirubin Urine NEGATIVE NEGATIVE   Ketones, ur NEGATIVE NEGATIVE mg/dL   Protein, ur NEGATIVE NEGATIVE mg/dL   Nitrite NEGATIVE NEGATIVE   Leukocytes, UA NEGATIVE NEGATIVE    Labs:   Lab Results  Component Value Date   WBC 6.6 04/07/2017   HGB 11.1 (L) 04/07/2017   HCT 32.3 (L) 04/07/2017   MCV 91.0 04/07/2017   PLT 105 (L) 04/07/2017    Recent Labs Lab 04/07/17 1644  NA 133*  K 4.0  CL 102  CO2 23  BUN 61*  CREATININE 2.31*  CALCIUM 8.1*  PROT 5.3*  BILITOT 0.6  ALKPHOS 114  ALT 14*  AST 22  GLUCOSE 112*    Lipid Panel     Component Value Date/Time   CHOL 163 01/23/2015 1040   TRIG 73 01/23/2015 1040   HDL 52 01/23/2015 1040   CHOLHDL 3.1 01/23/2015 1040   VLDL 15 01/23/2015 1040   LDLCALC 96 01/23/2015 1040    BNP (last 3 results) No results for input(s): BNP in the last 8760 hours.  HEMOGLOBIN A1C Lab Results  Component Value Date   HGBA1C 5.1 08/13/2014   MPG 100 08/13/2014    Cardiac Panel (last 3 results) No results for input(s): CKTOTAL, CKMB, TROPONINI, RELINDX in the last 8760 hours.  Lab Results  Component Value Date   TROPONINI 0.08 (H) 05/04/2015     TSH No results for input(s): TSH in the last 8760 hours.    (Not in a  hospital admission)    Current Facility-Administered Medications:  .  acetaminophen (TYLENOL) tablet 650 mg, 650 mg, Oral, Q6H PRN **OR** acetaminophen (TYLENOL) suppository 650 mg, 650 mg, Rectal, Q6H PRN, Etta Quill, DO .  [START ON 04/08/2017] aspirin EC tablet 81 mg, 81 mg, Oral, q morning - 10a, Alcario Drought, Jared M, DO .  [START ON 04/08/2017] atorvastatin (LIPITOR) tablet 80 mg, 80 mg, Oral, Daily, Alcario Drought, Jared M, DO .  [START ON 04/08/2017] B-12 CAPS 5,000 mcg, 5,000 mcg, Oral, q morning - 10a, Alcario Drought, Jared M, DO .  calcium carbonate (TUMS - dosed in mg elemental calcium) chewable tablet 200 mg of elemental calcium, 1 tablet, Oral, PRN, Etta Quill, DO .  [START ON 04/08/2017] clopidogrel (PLAVIX) tablet 75 mg, 75 mg, Oral, Daily, Alcario Drought, Jared M, DO .  heparin injection 5,000 Units, 5,000 Units, Subcutaneous, Q8H, Alcario Drought, Jared M, DO .  lidocaine-prilocaine (EMLA) cream 1 application, 1 application, Topical, Daily PRN, Alcario Drought, Jared M, DO .  ondansetron (ZOFRAN) tablet 4 mg, 4 mg, Oral, Q6H PRN **OR** ondansetron (ZOFRAN) injection 4 mg, 4 mg, Intravenous, Q6H PRN, Etta Quill, DO .  [START ON 04/08/2017] phenytoin (DILANTIN) ER capsule 300 mg, 300 mg, Oral, q morning - 10a, Etta Quill, DO .  [START ON 04/08/2017] pyridOXINE (VITAMIN B-6) tablet 100 mg, 100 mg, Oral, q  morning - 10a, Etta Quill, DO .  [START ON 04/08/2017] thiamine (VITAMIN B-1) tablet 250 mg, 250 mg, Oral, q morning - 10a, Etta Quill, DO  Current Outpatient Prescriptions:  .  acetaminophen (TYLENOL) 500 MG tablet, Take 500 mg by mouth every 6 (six) hours as needed., Disp: , Rfl:  .  amLODipine (NORVASC) 10 MG tablet, Take 10 mg by mouth daily., Disp: , Rfl:  .  aspirin EC 81 MG tablet, Take 1 tablet (81 mg total) by mouth daily. (Patient taking differently: Take 81 mg by mouth every morning. ), Disp: , Rfl:  .  atorvastatin (LIPITOR) 80 MG tablet, Take 80 mg by mouth daily., Disp: , Rfl: 3 .   calcium carbonate (TUMS - DOSED IN MG ELEMENTAL CALCIUM) 500 MG chewable tablet, Chew 1 tablet by mouth as needed for indigestion or heartburn., Disp: , Rfl:  .  carvedilol (COREG) 12.5 MG tablet, Take 6.25 mg by mouth 2 (two) times daily with a meal. , Disp: , Rfl:  .  clopidogrel (PLAVIX) 75 MG tablet, Take 75 mg by mouth daily. , Disp: , Rfl: 0 .  Cyanocobalamin (B-12) 5000 MCG CAPS, Take 5,000 mcg by mouth every morning. , Disp: , Rfl:  .  lidocaine-prilocaine (EMLA) cream, Apply 1 application topically daily as needed (port access). Apply to affected area once, Disp: 30 g, Rfl: 5 .  olmesartan (BENICAR) 20 MG tablet, Take 20 mg by mouth daily., Disp: , Rfl:  .  phenytoin (DILANTIN) 300 MG ER capsule, Take 1 capsule (300 mg total) by mouth every morning., Disp: 90 capsule, Rfl: 1 .  pyridOXINE (VITAMIN B-6) 100 MG tablet, Take 100 mg by mouth every morning. , Disp: , Rfl:  .  ranitidine (ZANTAC) 150 MG tablet, Take 150 mg by mouth as needed for heartburn., Disp: , Rfl:  .  Thiamine HCl (VITAMIN B-1) 250 MG tablet, Take 250 mg by mouth every morning. , Disp: , Rfl:   Facility-Administered Medications Ordered in Other Encounters:  .  sodium chloride flush (NS) 0.9 % injection 10 mL, 10 mL, Intravenous, PRN, Volanda Napoleon, MD, 10 mL at 05/05/16 1127 .  sodium chloride flush (NS) 0.9 % injection 10 mL, 10 mL, Intracatheter, PRN, Volanda Napoleon, MD, 10 mL at 06/10/16 1311  CARDIAC STUDIES:  EKG 03/07/2017: Marked sinus bradycardia at the rate of 49 bpm, normal axis, LVH.  No evidence of ischemia. Office Echocardiogram 03/30/2017:  1. Left ventricle cavity is normal in size. Mild concentric hypertrophy of the left ventricle. Elevated LAP. LAD territory severe hypokinesis. Moderate global hypokinesis Calculated EF 30%. 2. Inadequate tricuspid regurgitation jet to estimate pulmonary artery pressure 3. No significant change compared to prior study dated 08/17/2016  Assessment/Plan 1.   Hypovolemic shock due to dehydration 2.  Acute on chronic renal failure, stage IV 3.  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  Chronic systolic and diastolic heart failure 5.  Coronary artery disease  Coronary angiogram 06/10/2015: Left main and proximal LAD stent placed on 11/27/2013 is widely patent.  Circumflex ostium 80% stenosis followed by occlusion in the midsegment, bridging collaterals evident.  Occluded RCA with collaterals from the left.  LVEF 35% with global hypokinesis. 6.  Ongoing tobacco use disorder, juice tobacco  Recommendation: Patient needs gentle rehydration.  I do not think his presentation is consistent either with sepsis or with acute decompensated heart failure or end-stage cardiomyopathy.  He has history of metastatic renal cell carcinoma, cannot  exclude whether this is contributing to his presentation.  Patient is severely malnourished.  From cardiac standpoint there is no clinical evidence of acute decompensated heart failure.  I'll continue to follow him along.  His daughter is present at the bedside and all questions answered.  Adrian Prows, MD 04/07/2017, 11:14 PM [facility] [place] Pager: [phone] Office: [phone] If no answer: Cell:  [phone]

## 2017-04-07 NOTE — ED Provider Notes (Signed)
[facility] DEPT Provider Note   CSN: [id number] Arrival date & time: 04/07/17  1608     History   Chief Complaint Chief Complaint  Patient presents with  . Hypotension    HPI Gilbert [name] is a 66 y.o. male.  HPI  66 year old male with extensive past medical history as below including non-Hodgkin's lymphoma, coronary disease, severe systolic congestive heart failure with EF 30%, who presents with hypotension. The patient reportedly has felt generally unwell for the last week. He has not been eating or drinking very much. He lives alone and has felt too weak to make his own food. He has also reportedly been taking an extra dose of his blood pressure medication, that he does not recall which one. He was seen in Dr. Irven Shelling office today and was sent here for evaluation. They're concerned for possible dehydration. Patient denies any fevers. He is currently without complaints other than feeling generally weak as well as lightheaded with any position changes. He has not had any nausea. He has intermittent vomiting, since his cancer, and states that this has not worsened. He denies any current shortness of breath. No chest pain. No dysuria or frequency. No hematuria.  Family history present and confirms that he has not been eating or drinking very much. No recent medication changes.  Past Medical History:  Diagnosis Date  . Abnormal liver enzymes    Chronic alkaline phosphatase elevation since 2014  . Acid reflux   . CAD (coronary artery disease)   . Cancer ([place]) 08/2013   right kidney cancer  . Cancer ([place])   . Coronary artery calcification seen on CAT scan   . Coronary atherosclerosis of native coronary artery   . 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 Essential hypertension, benign   . Family history of anesthesia complication    mother-had stroke during anesthesia  . GERD (gastroesophageal reflux  disease)   . Gout   . History of cardiac arrest 11/26/13   PTCA/Stenting of LM & Prox LAD with 4.0 x 18 mm Xience alpine stent. Used Impella Circulatory assist device. EF= 20-25%  . History of epilepsy    at least 10 years ago. 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 History of myocardial infarction less than 8 weeks    Stent placed  . History of nephrectomy 11/2013   For renal cell carcinoma  . History of tobacco use   . HTN (hypertension)   . Hypercholesteremia   . Hyperlipemia   . Hypertension   . PAD (peripheral artery disease) ([place])   . Seizures ([place])   . Shortness of breath    with activity  . SOB (shortness of breath)   . Systolic and diastolic CHF, chronic (HCC)    Resolved. EF 45% 04/10/2014  . Therapeutic drug monitoring     Patient Active Problem List   Diagnosis Date Noted  . 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 Hematuria 10/29/2015  . Dysphagia, pharyngoesophageal phase   . Early satiety   . Esophageal stricture   . Gastritis and gastroduodenitis   . PAD (peripheral artery disease) ([place]) 05/28/2014  . Cardiomyopathy, ischemic 04/03/2014  . History of left heart catheterization (LHC) 02/26/2014  . Arterial bleed, intraoperative 02/26/2014  . Seizures ([place]) 11/27/2013  . HTN (hypertension) 11/27/2013  . Hypomagnesemia 11/27/2013  . CAD (coronary artery disease),  native coronary artery 11/26/2013  . Renal cancer ([place]) 10/19/2013  . Coronary artery calcification 09/25/2013  . Renal cyst 08/30/2013  . Loss of weight 08/03/2013    Past Surgical History:  Procedure Laterality Date  . BALLOON DILATION N/A 01/21/2015   Procedure: BALLOON DILATION;  Surgeon: Gatha Mayer, MD;  Location: [facility] ENDOSCOPY;  Service: Endoscopy;  Laterality: N/A;  . CARDIAC CATHETERIZATION N/A 06/10/2015   Procedure: Left Heart Cath and Coronary Angiography;  Surgeon:  Adrian Prows, MD;  Location: [facility] CV LAB;  Service: Cardiovascular;  Laterality: N/A;  . CORONARY ANGIOPLASTY WITH STENT PLACEMENT  2015   Left main coronary artery stenting on emergent basis along with circulatory support with Impella device through left leg. With 4.0 x 18 mm Xience alpine stent  . ESOPHAGOGASTRODUODENOSCOPY    . ESOPHAGOGASTRODUODENOSCOPY N/A 01/21/2015   Procedure: ESOPHAGOGASTRODUODENOSCOPY (EGD) with possible dilation.;  Surgeon: Gatha Mayer, MD;  Location: [facility] ENDOSCOPY;  Service: Endoscopy;  Laterality: N/A;  . EYE SURGERY Left 2 weeks ago   torn retina  . ILIAC ARTERY STENT Left 05/28/2014   dr Einar Gip  . LEFT HEART CATHETERIZATION WITH CORONARY ANGIOGRAM N/A 11/27/2013   Procedure: LEFT HEART CATHETERIZATION WITH CORONARY ANGIOGRAM;  Surgeon: [name], MD;  Location: [facility] CATH LAB;  Service: Cardiovascular;  Laterality: N/A;  . LOWER EXTREMITY ANGIOGRAM N/A 02/26/2014   Procedure: LOWER EXTREMITY ANGIOGRAM;  Surgeon: [name], MD;  Location: [facility] CATH LAB;  Service: Cardiovascular;  Laterality: N/A;  . LOWER EXTREMITY ANGIOGRAM N/A 05/28/2014   Procedure: LOWER EXTREMITY ANGIOGRAM;  Surgeon: [name], MD;  Location: [facility] CATH LAB;  Service: Cardiovascular;  Laterality: N/A;  . NEPHRECTOMY  11/2013  . NO PAST SURGERIES    . PERCUTANEOUS CORONARY STENT INTERVENTION (PCI-S)  11/27/2013   Procedure: PERCUTANEOUS CORONARY STENT INTERVENTION (PCI-S);  Surgeon: [name], MD;  Location: [facility] CATH LAB;  Service: Cardiovascular;;  . ROBOT ASSISTED LAPAROSCOPIC NEPHRECTOMY Right 10/19/2013   Procedure: ROBOTIC ASSISTED LAPAROSCOPIC NEPHRECTOMY,  EXTENSIVE ADHESIOLYSIS;  Surgeon: Alexis Frock, MD;  Location: [facility] ORS;  Service: Urology;  Laterality: Right;       Home Medications    Prior to Admission medications   Medication Sig Start Date End Date Taking? Authorizing Provider  acetaminophen (TYLENOL) 500 MG tablet Take 500 mg by mouth every 6  (six) hours as needed.   Yes [provider]  amLODipine (NORVASC) 10 MG tablet Take 10 mg by mouth daily.   Yes [provider]  aspirin EC 81 MG tablet Take 1 tablet (81 mg total) by mouth daily. Patient taking differently: Take 81 mg by mouth every morning.  12/11/13  Yes Angiulli, Lavon Paganini, PA-C  atorvastatin (LIPITOR) 80 MG tablet Take 80 mg by mouth daily. 09/05/15  Yes [provider]  calcium carbonate (TUMS - DOSED IN MG ELEMENTAL CALCIUM) 500 MG chewable tablet Chew 1 tablet by mouth as needed for indigestion or heartburn.   Yes [provider]  carvedilol (COREG) 12.5 MG tablet Take 6.25 mg by mouth 2 (two) times daily with a meal.  11/13/14  Yes [provider]  clopidogrel (PLAVIX) 75 MG tablet Take 75 mg by mouth daily.  10/31/15  Yes [provider]  Cyanocobalamin (B-12) 5000 MCG CAPS Take 5,000 mcg by mouth every morning.    Yes [provider]  lidocaine-prilocaine (EMLA) cream Apply 1 application topically daily as needed (port access). Apply to affected area once 01/06/17  Yes Ennever, Collier Salina  R, MD  olmesartan (BENICAR) 20 MG tablet Take 20 mg by mouth daily. 04/06/17  Yes [provider]  phenytoin (DILANTIN) 300 MG ER capsule Take 1 capsule (300 mg total) by mouth every morning. 01/27/17  Yes Wendie Agreste, MD  pyridOXINE (VITAMIN B-6) 100 MG tablet Take 100 mg by mouth every morning.    Yes [provider]  ranitidine (ZANTAC) 150 MG tablet Take 150 mg by mouth as needed for heartburn.   Yes [provider]  Thiamine HCl (VITAMIN B-1) 250 MG tablet Take 250 mg by mouth every morning.    Yes [provider]  doxycycline (VIBRA-TABS) 100 MG tablet Take 1 tablet (100 mg total) by mouth 2 (two) times daily. Patient not taking: Reported on 04/07/2017 03/22/17   Wendie Agreste, MD  pantoprazole (PROTONIX) 20 MG tablet Take 1 tablet (20 mg total) by mouth daily. Patient not taking: Reported  on 04/07/2017 02/19/16   Lacretia Leigh, MD    Family History Family History  Problem Relation Age of Onset  . Stroke Mother   . Heart disease Brother        multiple stents  . Hyperlipidemia Brother   . Emphysema Father     Social History Social History  Substance Use Topics  . Smoking status: Former Smoker    Packs/day: 2.00    Years: 45.00    Types: Cigarettes    Quit date: 08/03/2003  . Smokeless tobacco: Current User    Types: Chew     Comment: quit  smoking 10 years agop  . Alcohol use No     Comment: quit 1980     Allergies   Oxycodone   Review of Systems Review of Systems  Constitutional: Positive for appetite change and fatigue. Negative for chills and fever.  HENT: Negative for congestion and rhinorrhea.   Eyes: Negative for visual disturbance.  Respiratory: Negative for cough, shortness of breath and wheezing.   Cardiovascular: Negative for chest pain and leg swelling.  Gastrointestinal: Negative for abdominal pain, diarrhea, nausea and vomiting.  Genitourinary: Negative for dysuria and flank pain.  Musculoskeletal: Negative for neck pain and neck stiffness.  Skin: Negative for rash and wound.  Allergic/Immunologic: Negative for immunocompromised state.  Neurological: Positive for dizziness and weakness. Negative for syncope and headaches.  All other systems reviewed and are negative.    Physical Exam Updated Vital Signs BP (!) 101/55   Pulse (!) 50   Temp 97.9 F (36.6 C) (Oral)   Resp 19   SpO2 100%   Physical Exam  Constitutional: He is oriented to person, place, and time. He appears well-developed and well-nourished. No distress.  Chronically ill-appearing, in no distress  HENT:  Head: Normocephalic and atraumatic.  Moderately dry mucous membranes  Eyes: Conjunctivae are normal.  Neck: Neck supple.  Cardiovascular: Normal rate, regular rhythm and normal heart sounds.  Exam reveals no friction rub.   No murmur heard. Pulmonary/Chest:  Effort normal and breath sounds normal. No respiratory distress. He has no wheezes. He has no rales. He exhibits tenderness (Mild tenderness to palpation over left lateral chest wall).  Abdominal: Soft. Bowel sounds are normal. He exhibits no distension.  Musculoskeletal: He exhibits no edema.  Neurological: He is alert and oriented to person, place, and time. He exhibits normal muscle tone.  Skin: Capillary refill takes 2 to 3 seconds. There is pallor.  Psychiatric: He has a normal mood and affect.  Nursing note and vitals reviewed.  ED Treatments / Results  Labs (all labs ordered are listed, but only abnormal results are displayed) Labs Reviewed  CBC WITH DIFFERENTIAL/PLATELET - Abnormal; Notable for the following:       Result Value   RBC 3.55 (*)    Hemoglobin 11.1 (*)    HCT 32.3 (*)    Platelets 105 (*)    All other components within normal limits  COMPREHENSIVE METABOLIC PANEL - Abnormal; Notable for the following:    Sodium 133 (*)    Glucose, Bld 112 (*)    BUN 61 (*)    Creatinine, Ser 2.31 (*)    Calcium 8.1 (*)    Total Protein 5.3 (*)    Albumin 2.9 (*)    ALT 14 (*)    GFR calc non Af Amer 28 (*)    GFR calc Af Amer 32 (*)    All other components within normal limits  LIPASE, BLOOD - Abnormal; Notable for the following:    Lipase 94 (*)    All other components within normal limits  CULTURE, BLOOD (ROUTINE X 2)  CULTURE, BLOOD (ROUTINE X 2)  MAGNESIUM  URINALYSIS, ROUTINE W REFLEX MICROSCOPIC  I-STAT CG4 LACTIC ACID, ED  I-STAT TROPONIN, ED  I-STAT CG4 LACTIC ACID, ED    EKG  EKG Interpretation  Date/Time:  Thursday April 07 2017 16:48:24 EDT Ventricular Rate:  49 PR Interval:    QRS Duration: 121 QT Interval:  498 QTC Calculation: 450 R Axis:   76 Text Interpretation:  Sinus bradycardia Nonspecific intraventricular conduction delay Borderline repolarization abnormality No significant change since last tracing Confirmed by Duffy Bruce  [phone]) on 04/07/2017 5:48:58 PM       Radiology Dg Chest 2 View  Result Date: 04/07/2017 CLINICAL DATA:  Hypotension. EXAM: CHEST  2 VIEW COMPARISON:  February 26, 2016 FINDINGS: Stable right Port-A-Cath terminating in the SVC. No pneumothorax. The heart, hila, mediastinum, lungs, and pleura demonstrate no acute abnormalities. The mixed attenuation metastasis in the left glenoid is unchanged. No other bony changes. IMPRESSION: No active cardiopulmonary disease. Electronically Signed   By: Dorise Bullion III M.D   On: 04/07/2017 17:39    Procedures Procedures (including critical care time)  Medications Ordered in ED Medications  sodium chloride 0.9 % bolus 500 mL (0 mLs Intravenous Stopped 04/07/17 1753)  0.9 %  sodium chloride infusion ( Intravenous New Bag/Given 04/07/17 1753)     Initial Impression / Assessment and Plan / ED Course  I have reviewed the triage vital signs and the nursing notes.  Pertinent labs & imaging results that were available during my care of the patient were reviewed by me and considered in my medical decision making (see chart for details).     66 year old male here with symptomatic hypotension in the setting of several weeks of poor by mouth intake. On arrival, the patient is chronically ill-appearing and markedly dehydrated but nontoxic. He is mentating appropriately. His lab work shows marketed prerenal a KI with BUN of 61 and creatinine of 2.3. His white blood cell count is normal, lactic acid is normal, he has had no fevers, and I suspect this is more so secondary to profound hypovolemia rather than occult sepsis. Troponin is negative. I discussed the case with Dr. Einar Gip, who is in agreement. He recommends medicine admission, hold BB. Will admit for fluids, monitoring.   Final Clinical Impressions(s) / ED Diagnoses   Final diagnoses:  AKI (acute kidney injury) ([place])  Dehydration    New  Prescriptions New Prescriptions   No medications on file       Duffy Bruce, MD 04/08/17 [phone]

## 2017-04-07 NOTE — ED Triage Notes (Signed)
Per EMS: pt from cardiology office c/o hypotension and dehydration; pt sts 1 week of dizziness; poor PO intake; BP was 77/44

## 2017-04-07 NOTE — H&P (Signed)
History and Physical    Gilbert [name] VQM:[id number] DOB: 1951/07/07 DOA: 04/07/2017  PCP: Wendie Agreste, MD Cards: Einar Gip  Patient coming from: Home  I have personally briefly reviewed patient's old medical records in [place]  Chief Complaint: Hypotension  HPI: Gilbert [name] is a 66 y.o. male with medical history significant of NHL, CAD, CHF with EF 42% according to PCP notes.  Patient presents to the ED with hypotension and generalized weakness.  Symptoms onset about a week ago.  Poor PO intake this past week.  Thought his BP was high so took an extra BP med this AM.  Gilbert [name] to Dr. Irven Shelling office, BP 77 systolic, got sent in to ED.   ED Course: BP 84 systolic, improved to low 100s after 500cc bolus.  Creat 2.31 BUN 61.  Creat was 1.4 in June.   Review of Systems: As per HPI otherwise 10 point review of systems negative.   Past Medical History:  Diagnosis Date  . Abnormal liver enzymes    Chronic alkaline phosphatase elevation since 2014  . Acid reflux   . CAD (coronary artery disease)   . Cancer ([place]) 08/2013   right kidney cancer  . Cancer ([place])   . Coronary artery calcification seen on CAT scan   . Coronary atherosclerosis of native coronary artery   . 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 Essential hypertension, benign   . Family history of anesthesia complication    mother-had stroke during anesthesia  . GERD (gastroesophageal reflux disease)   . Gout   . History of cardiac arrest 11/26/13   PTCA/Stenting of LM & Prox LAD with 4.0 x 18 mm Xience alpine stent. Used Impella Circulatory assist device. EF= 20-25%  . History of epilepsy    at least 10 years ago. 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 History of myocardial infarction less than 8 weeks    Stent placed  . History of nephrectomy 11/2013   For renal cell carcinoma  . History of tobacco use   . HTN (hypertension)   .  Hypercholesteremia   . Hyperlipemia   . Hypertension   . PAD (peripheral artery disease) ([place])   . Seizures ([place])   . Shortness of breath    with activity  . SOB (shortness of breath)   . Systolic and diastolic CHF, chronic (HCC)    Resolved. EF 45% 04/10/2014  . Therapeutic drug monitoring     Past Surgical History:  Procedure Laterality Date  . BALLOON DILATION N/A 01/21/2015   Procedure: BALLOON DILATION;  Surgeon: Gatha Mayer, MD;  Location: [facility] ENDOSCOPY;  Service: Endoscopy;  Laterality: N/A;  . CARDIAC CATHETERIZATION N/A 06/10/2015   Procedure: Left Heart Cath and Coronary Angiography;  Surgeon: Adrian Prows, MD;  Location: [facility] CV LAB;  Service: Cardiovascular;  Laterality: N/A;  . CORONARY ANGIOPLASTY WITH STENT PLACEMENT  2015   Left main coronary artery stenting on emergent basis along with circulatory support with Impella device through left leg. With 4.0 x 18 mm Xience alpine stent  . ESOPHAGOGASTRODUODENOSCOPY    . ESOPHAGOGASTRODUODENOSCOPY N/A 01/21/2015   Procedure: ESOPHAGOGASTRODUODENOSCOPY (EGD) with possible dilation.;  Surgeon: Gatha Mayer, MD;  Location: [facility] ENDOSCOPY;  Service: Endoscopy;  Laterality: N/A;  . EYE SURGERY Left 2 weeks ago   torn retina  . ILIAC ARTERY STENT Left 05/28/2014   dr Einar Gip  . LEFT HEART CATHETERIZATION WITH CORONARY ANGIOGRAM N/A  11/27/2013   Procedure: LEFT HEART CATHETERIZATION WITH CORONARY ANGIOGRAM;  Surgeon: [name], MD;  Location: [facility] CATH LAB;  Service: Cardiovascular;  Laterality: N/A;  . LOWER EXTREMITY ANGIOGRAM N/A 02/26/2014   Procedure: LOWER EXTREMITY ANGIOGRAM;  Surgeon: [name], MD;  Location: [facility] CATH LAB;  Service: Cardiovascular;  Laterality: N/A;  . LOWER EXTREMITY ANGIOGRAM N/A 05/28/2014   Procedure: LOWER EXTREMITY ANGIOGRAM;  Surgeon: [name], MD;  Location: [facility] CATH LAB;  Service: Cardiovascular;  Laterality: N/A;  . NEPHRECTOMY  11/2013  . NO PAST SURGERIES    . PERCUTANEOUS  CORONARY STENT INTERVENTION (PCI-S)  11/27/2013   Procedure: PERCUTANEOUS CORONARY STENT INTERVENTION (PCI-S);  Surgeon: [name], MD;  Location[facility] CATH LAB;  Service: Cardiovascular;;  . ROBOT ASSISTED LAPAROSCOPIC NEPHRECTOMY Right 10/19/2013   Procedure: ROBOTIC ASSISTED LAPAROSCOPIC NEPHRECTOMY,  EXTENSIVE ADHESIOLYSIS;  Surgeon: Alexis Frock, MD;  Location: [facility] ORS;  Service: Urology;  Laterality: Right;     reports that he quit smoking about 13 years ago. His smoking use included Cigarettes. He has a 90.00 pack-year smoking history. His smokeless tobacco use includes Chew. He reports that he does not drink alcohol or use drugs.  Allergies  Allergen Reactions  . Oxycodone Swelling and Other (See Comments)    Tongue and lip    Family History  Problem Relation Age of Onset  . Stroke Mother   . Heart disease Brother        multiple stents  . Hyperlipidemia Brother   . Emphysema Father      Prior to Admission medications   Medication Sig Start Date End Date Taking? Authorizing Provider  acetaminophen (TYLENOL) 500 MG tablet Take 500 mg by mouth every 6 (six) hours as needed.   Yes [provider]  amLODipine (NORVASC) 10 MG tablet Take 10 mg by mouth daily.   Yes [provider]  aspirin EC 81 MG tablet Take 1 tablet (81 mg total) by mouth daily. Patient taking differently: Take 81 mg by mouth every morning.  12/11/13  Yes Angiulli, Lavon Paganini, PA-C  atorvastatin (LIPITOR) 80 MG tablet Take 80 mg by mouth daily. 09/05/15  Yes [provider]  calcium carbonate (TUMS - DOSED IN MG ELEMENTAL CALCIUM) 500 MG chewable tablet Chew 1 tablet by mouth as needed for indigestion or heartburn.   Yes [provider]  carvedilol (COREG) 12.5 MG tablet Take 6.25 mg by mouth 2 (two) times daily with a meal.  11/13/14  Yes [provider]  clopidogrel (PLAVIX) 75 MG tablet Take 75 mg by mouth daily.  10/31/15  Yes [provider]    Cyanocobalamin (B-12) 5000 MCG CAPS Take 5,000 mcg by mouth every morning.    Yes [provider]  lidocaine-prilocaine (EMLA) cream Apply 1 application topically daily as needed (port access). Apply to affected area once 01/06/17  Yes Ennever, Rudell Cobb, MD  olmesartan (BENICAR) 20 MG tablet Take 20 mg by mouth daily. 04/06/17  Yes [provider]  phenytoin (DILANTIN) 300 MG ER capsule Take 1 capsule (300 mg total) by mouth every morning. 01/27/17  Yes Wendie Agreste, MD  pyridOXINE (VITAMIN B-6) 100 MG tablet Take 100 mg by mouth every morning.    Yes [provider]  ranitidine (ZANTAC) 150 MG tablet Take 150 mg by mouth as needed for heartburn.   Yes [provider]  Thiamine HCl (VITAMIN B-1) 250 MG tablet Take 250 mg by mouth every morning.  Yes [provider]    Physical Exam: Vitals:   04/07/17 1745 04/07/17 1845 04/07/17 1930 04/07/17 2015  BP: (!) 101/55 (!) 102/58 105/70 (!) 101/58  Pulse:   (!) 25 (!) 53  Resp: 19 18 14 17   Temp:      TempSrc:      SpO2:    94%    Constitutional: NAD, calm, comfortable Eyes: PERRL, lids and conjunctivae normal ENMT: Mucous membranes are moist. Posterior pharynx clear of any exudate or lesions.Normal dentition.  Neck: normal, supple, no masses, no thyromegaly Respiratory: clear to auscultation bilaterally, no wheezing, no crackles. Normal respiratory effort. No accessory muscle use.  Cardiovascular: Regular rate and rhythm, no murmurs / rubs / gallops. No extremity edema. 2+ pedal pulses. No carotid bruits.  Abdomen: no tenderness, no masses palpated. No hepatosplenomegaly. Bowel sounds positive.  Musculoskeletal: no clubbing / cyanosis. No joint deformity upper and lower extremities. Good ROM, no contractures. Normal muscle tone.  Skin: no rashes, lesions, ulcers. No induration, cyst excision site dosent appear grossly infected Neurologic: CN 2-12 grossly intact. Sensation intact, DTR normal.  Strength 5/5 in all 4.  Psychiatric: Normal judgment and insight. Alert and oriented x 3. Normal mood.    Labs on Admission: I have personally reviewed following labs and imaging studies  CBC:  Recent Labs Lab 04/07/17 1644  WBC 6.6  NEUTROABS 4.4  HGB 11.1*  HCT 32.3*  MCV 91.0  PLT 297*   Basic Metabolic Panel:  Recent Labs Lab 04/07/17 1644  NA 133*  K 4.0  CL 102  CO2 23  GLUCOSE 112*  BUN 61*  CREATININE 2.31*  CALCIUM 8.1*  MG 2.3   GFR: Estimated Creatinine Clearance: 27.1 mL/min (A) (by C-G formula based on SCr of 2.31 mg/dL (H)). Liver Function Tests:  Recent Labs Lab 04/07/17 1644  AST 22  ALT 14*  ALKPHOS 114  BILITOT 0.6  PROT 5.3*  ALBUMIN 2.9*    Recent Labs Lab 04/07/17 1644  LIPASE 94*   No results for input(s): AMMONIA in the last 168 hours. Coagulation Profile: No results for input(s): INR, PROTIME in the last 168 hours. Cardiac Enzymes: No results for input(s): CKTOTAL, CKMB, CKMBINDEX, TROPONINI in the last 168 hours. BNP (last 3 results) No results for input(s): PROBNP in the last 8760 hours. HbA1C: No results for input(s): HGBA1C in the last 72 hours. CBG: No results for input(s): GLUCAP in the last 168 hours. Lipid Profile: No results for input(s): CHOL, HDL, LDLCALC, TRIG, CHOLHDL, LDLDIRECT in the last 72 hours. Thyroid Function Tests: No results for input(s): TSH, T4TOTAL, FREET4, T3FREE, THYROIDAB in the last 72 hours. Anemia Panel: No results for input(s): VITAMINB12, FOLATE, FERRITIN, TIBC, IRON, RETICCTPCT in the last 72 hours. Urine analysis:    Component Value Date/Time   COLORURINE YELLOW 02/12/2016 1526   APPEARANCEUR CLEAR 02/12/2016 1526   LABSPEC 1.029 02/12/2016 1526   PHURINE 5.5 02/12/2016 1526   GLUCOSEU NEGATIVE 02/12/2016 1526   HGBUR MODERATE (A) 02/12/2016 1526   BILIRUBINUR NEGATIVE 02/12/2016 1526   BILIRUBINUR negative 11/05/2015 1042   KETONESUR NEGATIVE 02/12/2016 1526   PROTEINUR >300  (A) 02/12/2016 1526   UROBILINOGEN 0.2 11/05/2015 1042   UROBILINOGEN 0.2 06/24/2013 1312   NITRITE NEGATIVE 02/12/2016 1526   LEUKOCYTESUR NEGATIVE 02/12/2016 1526    Radiological Exams on Admission: Dg Chest 2 View  Result Date: 04/07/2017 CLINICAL DATA:  Hypotension. EXAM: CHEST  2 VIEW COMPARISON:  February 26, 2016 FINDINGS: Stable right Port-A-Cath terminating  in the SVC. No pneumothorax. The heart, hila, mediastinum, lungs, and pleura demonstrate no acute abnormalities. The mixed attenuation metastasis in the left glenoid is unchanged. No other bony changes. IMPRESSION: No active cardiopulmonary disease. Electronically Signed   By: Dorise Bullion III M.D   On: 04/07/2017 17:39    EKG: Independently reviewed.  Assessment/Plan Principal Problem:   AKI (acute kidney injury) ([place]) Active Problems:   Diffuse non-Hodgkin's lymphoma of bone (HCC)   Hypotension    1. AKI - likely secondary to hypotension, hypotension due to dehydration and extra BP med 1. Hold BP meds 2. IVF: 500cc bolus in ED and 75 cc/hr 3. Repeat BMP in AM 2. NHL - Patient appears to be overdue for next Xgeva with Dr. Marin Olp (obviously cant do this in setting of AKI though)  DVT prophylaxis: Lovenox Code Status: Full Family Communication: No family in room Disposition Plan: Home after admit Consults called: None Admission status: Place in obs   GARDNER, Manuel Garcia Hospitalists Pager [phone]  If 7AM-7PM, please contact day team taking care of patient [email or web address] Password [email or web address]  04/07/2017, 8:40 PM

## 2017-04-08 DIAGNOSIS — E860 Dehydration: Secondary | ICD-10-CM

## 2017-04-08 DIAGNOSIS — I951 Orthostatic hypotension: Secondary | ICD-10-CM

## 2017-04-08 DIAGNOSIS — N179 Acute kidney failure, unspecified: Secondary | ICD-10-CM | POA: Diagnosis not present

## 2017-04-08 LAB — BASIC METABOLIC PANEL
Anion gap: 7 (ref 5–15)
BUN: 51 mg/dL — AB (ref 6–20)
CHLORIDE: 104 mmol/L (ref 101–111)
CO2: 25 mmol/L (ref 22–32)
CREATININE: 1.93 mg/dL — AB (ref 0.61–1.24)
Calcium: 8.1 mg/dL — ABNORMAL LOW (ref 8.9–10.3)
GFR calc Af Amer: 40 mL/min — ABNORMAL LOW (ref >60)
GFR calc non Af Amer: 35 mL/min — ABNORMAL LOW (ref >60)
Glucose, Bld: 137 mg/dL — ABNORMAL HIGH (ref 65–99)
POTASSIUM: 4.3 mmol/L (ref 3.5–5.1)
Sodium: 136 mmol/L (ref 135–145)

## 2017-04-08 MED ORDER — ENSURE ENLIVE PO LIQD
237.0000 mL | Freq: Two times a day (BID) | ORAL | Status: DC
  Administered 2017-04-08: 237 mL via ORAL

## 2017-04-08 MED ORDER — SODIUM CHLORIDE 0.9% FLUSH
10.0000 mL | INTRAVENOUS | Status: DC | PRN
  Administered 2017-04-08: 10 mL
  Filled 2017-04-08: qty 40

## 2017-04-08 MED ORDER — HEPARIN SOD (PORK) LOCK FLUSH 100 UNIT/ML IV SOLN
500.0000 [IU] | INTRAVENOUS | Status: AC | PRN
  Administered 2017-04-08: 500 [IU]

## 2017-04-08 MED ORDER — ALUM & MAG HYDROXIDE-SIMETH 200-200-20 MG/5ML PO SUSP
15.0000 mL | ORAL | Status: DC | PRN
  Administered 2017-04-08: 15 mL via ORAL
  Filled 2017-04-08: qty 30

## 2017-04-08 MED ORDER — CARVEDILOL 3.125 MG PO TABS
3.1250 mg | ORAL_TABLET | Freq: Two times a day (BID) | ORAL | 0 refills | Status: DC
Start: 2017-04-08 — End: 2018-05-27

## 2017-04-08 MED ORDER — SODIUM CHLORIDE 0.9 % IV BOLUS (SEPSIS)
1000.0000 mL | Freq: Once | INTRAVENOUS | Status: AC
  Administered 2017-04-08: 1000 mL via INTRAVENOUS

## 2017-04-08 NOTE — Evaluation (Signed)
Physical Therapy Evaluation & Discharge Patient Details Name: Gilbert [name] MRN: [id number] DOB: 04/13/1951 Today's Date: 04/08/2017   History of Present Illness  Pt is a 66 y/o male admitted secondary to hypotension and dehydration. PMH including but not limited to CAD with hx of MI in 2015, PAD, HLD, HTN, R kidney cancer and non-Hodgkin's lymphoma.  Clinical Impression  Pt presented supine in bed with HOB elevated, awake and willing to participate in therapy session. Prior to admission, pt reported that he lives alone and is very independent. He enjoys playing golf and fishing. Pt performed bed mobility and transfers independently. Pt ambulated in hallway, pushing IV pole with no instability or LOB. Pt with no concerns or questions at this time. No further acute PT needs identified at this time. PT signing off.    Follow Up Recommendations No PT follow up    Equipment Recommendations  None recommended by PT    Recommendations for Other Services       Precautions / Restrictions Precautions Precautions: None Restrictions Weight Bearing Restrictions: No      Mobility  Bed Mobility Overal bed mobility: Independent                Transfers Overall transfer level: Independent                  Ambulation/Gait Ambulation/Gait assistance: Modified independent (Device/Increase time) Ambulation Distance (Feet): 200 Feet Assistive device: None (pushed IV pole) Gait Pattern/deviations: WFL(Within Functional Limits) Gait velocity: WFL   General Gait Details: no instability or LOB; pt preferring to push IV pole but could easily ambulate without   Stairs            Wheelchair Mobility    Modified Rankin (Stroke Patients Only)       Balance Overall balance assessment: Needs assistance Sitting-balance support: Feet supported Sitting balance-Leahy Scale: Normal     Standing balance support: During functional activity;No upper extremity supported Standing  balance-Leahy Scale: Good                               Pertinent Vitals/Pain Pain Assessment: No/denies pain    Home Living Family/patient expects to be discharged to:: Private residence Living Arrangements: Alone   Type of Home: House Home Access: Stairs to enter Entrance Stairs-Rails: Right;Left;Can reach both Technical brewer of Steps: 4 Home Layout: One level Home Equipment: Environmental consultant - 2 wheels      Prior Function Level of Independence: Independent               Hand Dominance        Extremity/Trunk Assessment   Upper Extremity Assessment Upper Extremity Assessment: Overall WFL for tasks assessed    Lower Extremity Assessment Lower Extremity Assessment: Overall WFL for tasks assessed    Cervical / Trunk Assessment Cervical / Trunk Assessment: Normal  Communication   Communication: No difficulties  Cognition Arousal/Alertness: Awake/alert Behavior During Therapy: WFL for tasks assessed/performed;Flat affect Overall Cognitive Status: Within Functional Limits for tasks assessed                                        General Comments      Exercises     Assessment/Plan    PT Assessment Patent does not need any further PT services  PT Problem List  PT Treatment Interventions      PT Goals (Current goals can be found in the Care Plan section)  Acute Rehab PT Goals Patient Stated Goal: return home    Frequency     Barriers to discharge        Co-evaluation               AM-PAC PT "6 Clicks" Daily Activity  Outcome Measure Difficulty turning over in bed (including adjusting bedclothes, sheets and blankets)?: None Difficulty moving from lying on back to sitting on the side of the bed? : None Difficulty sitting down on and standing up from a chair with arms (e.g., wheelchair, bedside commode, etc,.)?: None Help needed moving to and from a bed to chair (including a wheelchair)?: None Help needed  walking in hospital room?: None Help needed climbing 3-5 steps with a railing? : A Little 6 Click Score: 23    End of Session   Activity Tolerance: Patient tolerated treatment well Patient left: in bed;with call bell/phone within reach Nurse Communication: Mobility status PT Visit Diagnosis: Muscle weakness (generalized) (M62.81)    Time: 1610-9604 PT Time Calculation (min) (ACUTE ONLY): 14 min   Charges:   PT Evaluation $PT Eval Low Complexity: 1 Low     PT G Codes:   PT G-Codes **NOT FOR INPATIENT CLASS** Functional Assessment Tool Used: AM-PAC 6 Clicks Basic Mobility;Clinical judgement Functional Limitation: Mobility: Walking and moving around Mobility: Walking and Moving Around Current Status ([id number]): At least 1 percent but less than 20 percent impaired, limited or restricted Mobility: Walking and Moving Around Goal Status [phone]): 0 percent impaired, limited or restricted Mobility: Walking and Moving Around Discharge Status [phone]): At least 1 percent but less than 20 percent impaired, limited or restricted    [facility], [place], DPT [place] 04/08/2017, 2:45 PM

## 2017-04-08 NOTE — Care Management Obs Status (Signed)
[place] NOTIFICATION   Patient Details  Name: Gilbert [name] MRN: [id number] Date of Birth: Jul 17, 1951   Medicare Observation Status Notification Given:  Yes    Marilu Favre, RN 04/08/2017, 11:29 AM

## 2017-04-08 NOTE — Progress Notes (Addendum)
1400 Ortho V/S positive, Dr Florene Glen notified.  Pt was up and ambulating with PT to the hall, denies dizziness, not light-headed. IV fluid bolus given. 1600 Dr Florene Glen came in and seen pt. Discharge instructions given, pt verbalized understanding. Discharged to home accompanied by daughter.

## 2017-04-08 NOTE — Discharge Summary (Signed)
Physician Discharge Summary  Gilbert [name] QQI:[id number] DOB: [birth year] DOA: 04/07/2017  PCP: Gilbert Agreste, MD  Admit date: 04/07/2017 Discharge date: 04/08/2017  Time spent: over 30 minutes  Recommendations for Outpatient Follow-up:  1. Follow up repeat CBC and BMP within next week 2. Follow up orthostatic vitals and volume status 3. Follow up blood cultures 4. 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 male with medical history significant of NHL, CAD, RCC, CHF with EF 42% according to PCP notes.  Patient presents to the ED with hypotension and generalized weakness.  Symptoms onset about Gilbert [name] week ago.  Poor PO intake this past week.  Thought his BP was high so took an extra BP med this AM.  Gilbert [name] to Dr. Irven Shelling office, BP 77 systolic, got sent in to ED.  He was orthostatic during hospital day 1, but was able to work with PT without problem.  Plan for discharge with follow up with cards and PCP.   Hospital Course:  1. Orthostatic Hypotension:  Due to hypovolemia vs dysautonomia, likely hypovolemia with elevated creatinine.  Decreased BP meds (stopped amlodipine and benicar), decreased carvedilol to 3.125 BID after discussing with Dr. Einar Gip.   2. AKI - improved with IVF, BP meds as above. 3. NHL - Patient appears to be overdue for next Xgeva with Dr. Marin Olp   Procedures:  none (i.e. Studies not automatically included, echos, thoracentesis, etc; not x-rays)  Consultations:  cardiology  Discharge Exam: Vitals:   04/08/17 1310 04/08/17 1527  BP:    Pulse: 62    Resp: 19   Temp: 98 F (36.7 C)   SpO2:  100%   Feeling ok.  Not feeling like he wants to be here, wants to go home.  General: No acute distress. Cardiovascular: Heart sounds show Gilbert [name] regular rate, and rhythm. No gallops or rubs. No murmurs. No JVD. Lungs: Clear to auscultation bilaterally with good air movement. No rales, rhonchi or wheezes. Abdomen: Soft, nontender, nondistended with normal active bowel sounds. No masses. No hepatosplenomegaly. Neurological: Alert and oriented 3. Moves all extremities 4 with equal strength. Cranial nerves II through XII grossly intact. Skin: Warm and dry. No rashes or lesions. Extremities: No clubbing or cyanosis. No edema. Pedal pulses 2+. Psychiatric: Mood and affect are normal. Insight and judgment are appropriate.  Discharge Instructions   Discharge Instructions    Call MD for:  difficulty breathing, headache or visual disturbances    Complete by:  As directed    Call MD for:  persistant dizziness or light-headedness    Complete by:  As directed    Call MD for:  severe uncontrolled pain    Complete by:  As directed    Diet - low sodium heart healthy    Complete by:  As directed    Diet - low sodium heart healthy    Complete by:  As directed    Discharge instructions    Complete by:  As directed    You were seen in the hospital for dehydration and hypotension.  Your blood pressure dropped when  you stood, which is Gilbert [name] sign of being dehydrated.  We stopped some of your blood pressure medicines (the amlodipine and the benicar).  We decreased your carvedilol to 3.125 twice daily.  Please hold these until you follow up with your primary doctor or Dr. Einar Gip.  Stand and change positions slowly, this will help with the low blood pressure when standing.   Increase activity slowly    Complete by:  As directed    Increase activity slowly    Complete by:  As directed      Current Discharge Medication List    CONTINUE these medications which have  CHANGED   Details  carvedilol (COREG) 3.125 MG tablet Take 1 tablet (3.125 mg total) by mouth 2 (two) times daily with Infantof Villagomez meal. Qty: 60 tablet, Refills: 0      CONTINUE these medications which have NOT CHANGED   Details  acetaminophen (TYLENOL) 500 MG tablet Take 500 mg by mouth every 6 (six) hours as needed.    aspirin EC 81 MG tablet Take 1 tablet (81 mg total) by mouth daily.    atorvastatin (LIPITOR) 80 MG tablet Take 80 mg by mouth daily. Refills: 3    calcium carbonate (TUMS - DOSED IN MG ELEMENTAL CALCIUM) 500 MG chewable tablet Chew 1 tablet by mouth as needed for indigestion or heartburn.    clopidogrel (PLAVIX) 75 MG tablet Take 75 mg by mouth daily.  Refills: 0   Associated Diagnoses: Renal cancer, right (HCC)    Cyanocobalamin (B-12) 5000 MCG CAPS Take 5,000 mcg by mouth every morning.     lidocaine-prilocaine (EMLA) cream Apply 1 application topically daily as needed (port access). 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 Qty: 90 capsule, Refills: 1   Associated Diagnoses: Seizure disorder (HCC)    pyridOXINE (VITAMIN B-6) 100 MG tablet Take 100 mg by mouth every morning.     ranitidine (ZANTAC) 150 MG tablet Take 150 mg by mouth as needed for heartburn.    Thiamine HCl (VITAMIN B-1) 250 MG tablet Take 250 mg by mouth every morning.       STOP taking these medications     amLODipine (NORVASC) 10 MG tablet      olmesartan (BENICAR) 20 MG tablet        Allergies  Allergen Reactions  . Oxycodone Swelling and Other (See Comments)    Tongue and lip   Follow-up Information    Gilbert Agreste, MD Follow up.   Specialties:  Family Medicine, Sports Medicine Contact information: [place] [address] [phone]         Adrian Prows, MD. Schedule an appointment as soon as possible for Gilbert [name] visit.   Specialty:  Cardiology Contact information: [address] [place]  [address] [phone]            The results of significant diagnostics from this hospitalization (including imaging, microbiology, ancillary and laboratory) are listed below for reference.    Significant Diagnostic Studies: Dg Chest 2 View  Result Date: 04/07/2017 CLINICAL DATA:  Hypotension. EXAM: CHEST  2 VIEW COMPARISON:  February 26, 2016 FINDINGS: Stable right [place] Stefan-Cath terminating in the SVC. No pneumothorax. The heart, hila, mediastinum, lungs, and pleura demonstrate no acute abnormalities. The mixed attenuation metastasis in  the left glenoid is unchanged. No other bony changes. IMPRESSION: No active cardiopulmonary disease. Electronically Signed   By: Dorise Bullion III M.D   On: 04/07/2017 17:39    Microbiology: No results found for this or any previous visit (from the past 240 hour(s)).   Labs: Basic Metabolic Panel:  Recent Labs Lab 04/07/17 1644 04/08/17 0104  NA 133* 136  K 4.0 4.3  CL 102 104  CO2 23 25  GLUCOSE 112* 137*  BUN 61* 51*  CREATININE 2.31* 1.93*  CALCIUM 8.1* 8.1*  MG 2.3  --    Liver Function Tests:  Recent Labs Lab 04/07/17 1644  AST 22  ALT 14*  ALKPHOS 114  BILITOT 0.6  PROT 5.3*  ALBUMIN 2.9*    Recent Labs Lab 04/07/17 1644  LIPASE 94*   No results for input(s): AMMONIA in the last 168 hours. CBC:  Recent Labs Lab 04/07/17 1644  WBC 6.6  NEUTROABS 4.4  HGB 11.1*  HCT 32.3*  MCV 91.0  PLT 105*   Cardiac Enzymes: No results for input(s): CKTOTAL, CKMB, CKMBINDEX, TROPONINI in the last 168 hours. BNP: BNP (last 3 results) No results for input(s): BNP in the last 8760 hours.  ProBNP (last 3 results) No results for input(s): PROBNP in the last 8760 hours.  CBG: No results for input(s): GLUCAP in the last 168 hours.     Signed:  Tevita Gomer Melven Sartorius MD.  [facility] 04/08/2017, 3:54 PM

## 2017-04-12 LAB — CULTURE, BLOOD (ROUTINE X 2)
CULTURE: NO GROWTH
SPECIAL REQUESTS: ADEQUATE

## 2017-04-13 LAB — CULTURE, BLOOD (ROUTINE X 2): Culture: NO GROWTH

## 2017-04-14 DIAGNOSIS — C8590 Non-Hodgkin lymphoma, unspecified, unspecified site: Secondary | ICD-10-CM | POA: Diagnosis not present

## 2017-04-14 DIAGNOSIS — I255 Ischemic cardiomyopathy: Secondary | ICD-10-CM | POA: Diagnosis not present

## 2017-04-14 DIAGNOSIS — I951 Orthostatic hypotension: Secondary | ICD-10-CM | POA: Diagnosis not present

## 2017-04-14 DIAGNOSIS — I2510 Atherosclerotic heart disease of native coronary artery without angina pectoris: Secondary | ICD-10-CM | POA: Diagnosis not present

## 2017-04-14 DIAGNOSIS — R0789 Other chest pain: Secondary | ICD-10-CM | POA: Diagnosis not present

## 2017-04-19 ENCOUNTER — Other Ambulatory Visit: Payer: Self-pay | Admitting: Hematology & Oncology

## 2017-04-20 ENCOUNTER — Inpatient Hospital Stay: Payer: Medicare Other | Attending: Hematology & Oncology | Admitting: Hematology & Oncology

## 2017-04-20 ENCOUNTER — Ambulatory Visit: Payer: Medicare Other

## 2017-04-20 ENCOUNTER — Other Ambulatory Visit: Payer: Self-pay | Admitting: Family

## 2017-04-20 ENCOUNTER — Ambulatory Visit (HOSPITAL_BASED_OUTPATIENT_CLINIC_OR_DEPARTMENT_OTHER): Payer: Medicare Other

## 2017-04-20 ENCOUNTER — Other Ambulatory Visit (HOSPITAL_BASED_OUTPATIENT_CLINIC_OR_DEPARTMENT_OTHER): Payer: Medicare Other

## 2017-04-20 VITALS — BP 122/58 | HR 74 | Temp 98.3°F | Resp 20 | Wt 134.8 lb

## 2017-04-20 DIAGNOSIS — C8330 Diffuse large B-cell lymphoma, unspecified site: Secondary | ICD-10-CM

## 2017-04-20 DIAGNOSIS — N6320 Unspecified lump in the left breast, unspecified quadrant: Secondary | ICD-10-CM

## 2017-04-20 DIAGNOSIS — Z95828 Presence of other vascular implants and grafts: Secondary | ICD-10-CM

## 2017-04-20 DIAGNOSIS — C8589 Other specified types of non-Hodgkin lymphoma, extranodal and solid organ sites: Secondary | ICD-10-CM

## 2017-04-20 DIAGNOSIS — C8580 Other specified types of non-Hodgkin lymphoma, unspecified site: Secondary | ICD-10-CM

## 2017-04-20 DIAGNOSIS — C641 Malignant neoplasm of right kidney, except renal pelvis: Secondary | ICD-10-CM | POA: Diagnosis not present

## 2017-04-20 DIAGNOSIS — C8339 Diffuse large B-cell lymphoma, extranodal and solid organ sites: Secondary | ICD-10-CM | POA: Diagnosis not present

## 2017-04-20 DIAGNOSIS — D6481 Anemia due to antineoplastic chemotherapy: Secondary | ICD-10-CM | POA: Diagnosis not present

## 2017-04-20 DIAGNOSIS — C649 Malignant neoplasm of unspecified kidney, except renal pelvis: Secondary | ICD-10-CM

## 2017-04-20 LAB — CMP (CANCER CENTER ONLY)
ALT(SGPT): 27 U/L (ref 10–47)
AST: 29 U/L (ref 11–38)
Albumin: 3 g/dL — ABNORMAL LOW (ref 3.3–5.5)
Alkaline Phosphatase: 104 U/L — ABNORMAL HIGH (ref 26–84)
BUN, Bld: 15 mg/dL (ref 7–22)
CO2: 27 meq/L (ref 18–33)
Calcium: 8.8 mg/dL (ref 8.0–10.3)
Chloride: 109 meq/L — ABNORMAL HIGH (ref 98–108)
Creat: 1.3 mg/dL — ABNORMAL HIGH (ref 0.6–1.2)
Glucose, Bld: 137 mg/dL — ABNORMAL HIGH (ref 73–118)
Potassium: 3.6 meq/L (ref 3.3–4.7)
Sodium: 140 meq/L (ref 128–145)
Total Bilirubin: 0.5 mg/dL (ref 0.20–1.60)
Total Protein: 5.9 g/dL — ABNORMAL LOW (ref 6.4–8.1)

## 2017-04-20 LAB — CBC WITH DIFFERENTIAL (CANCER CENTER ONLY)
BASO#: 0 10*3/uL (ref 0.0–0.2)
BASO%: 0.6 % (ref 0.0–2.0)
EOS ABS: 0.6 10*3/uL — AB (ref 0.0–0.5)
EOS%: 10.8 % — ABNORMAL HIGH (ref 0.0–7.0)
HCT: 29.8 % — ABNORMAL LOW (ref 38.7–49.9)
HEMOGLOBIN: 10.4 g/dL — AB (ref 13.0–17.1)
LYMPH#: 0.6 10*3/uL — ABNORMAL LOW (ref 0.9–3.3)
LYMPH%: 10.3 % — ABNORMAL LOW (ref 14.0–48.0)
MCH: 32.5 pg (ref 28.0–33.4)
MCHC: 34.9 g/dL (ref 32.0–35.9)
MCV: 93 fL (ref 82–98)
MONO#: 0.4 10*3/uL (ref 0.1–0.9)
MONO%: 6.7 % (ref 0.0–13.0)
NEUT#: 3.8 10*3/uL (ref 1.5–6.5)
NEUT%: 71.6 % (ref 40.0–80.0)
Platelets: 115 10*3/uL — ABNORMAL LOW (ref 145–400)
RBC: 3.2 10*6/uL — AB (ref 4.20–5.70)
RDW: 12.5 % (ref 11.1–15.7)
WBC: 5.4 10*3/uL (ref 4.0–10.0)

## 2017-04-20 MED ORDER — SODIUM CHLORIDE 0.9% FLUSH
10.0000 mL | INTRAVENOUS | Status: DC | PRN
  Administered 2017-04-20: 10 mL via INTRAVENOUS
  Filled 2017-04-20: qty 10

## 2017-04-20 MED ORDER — DENOSUMAB 120 MG/1.7ML ~~LOC~~ SOLN
SUBCUTANEOUS | Status: AC
  Filled 2017-04-20: qty 1.7

## 2017-04-20 MED ORDER — HEPARIN SOD (PORK) LOCK FLUSH 100 UNIT/ML IV SOLN
500.0000 [IU] | Freq: Once | INTRAVENOUS | Status: AC
Start: 2017-04-20 — End: 2017-04-20
  Administered 2017-04-20: 500 [IU] via INTRAVENOUS
  Filled 2017-04-20: qty 5

## 2017-04-20 MED ORDER — DENOSUMAB 120 MG/1.7ML ~~LOC~~ SOLN
120.0000 mg | Freq: Once | SUBCUTANEOUS | Status: AC
  Administered 2017-04-20: 120 mg via SUBCUTANEOUS

## 2017-04-20 NOTE — Progress Notes (Signed)
Hematology and Oncology Follow Up Visit  Gilbert [name] [id number] [birth year] 66 y.o. 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 He comes in with his daughter. We last saw him back in June.  He is doing okay. He's had a pretty uneventful summer.  He is getting ready to go to [facility] in a couple weeks for his annual golf trip with his friends. He really enjoys this.  He has had no cardiac issues. He has had no fever. He's had no cough or shortness of breath. He's had no nausea or vomiting.  He does have some heartburn. I told him to try some over the counter and acids. I preferred Pepcid.  There's been no leg swelling. He's had no leg pain.  There's been no headache.  He's had no change in medications.  Overall, his performance status is ECOG 1.     Medications:  Allergies as of 04/20/2017      Reactions   Oxycodone Swelling, Other (See Comments)   Tongue and lip      Medication List       Accurate as of 04/20/17  3:27 PM. Always use your most recent med list.          acetaminophen 500 MG tablet Commonly known as:  TYLENOL Take 500 mg by mouth every 6 (six) hours as needed.   aspirin EC 81 MG tablet Take 1 tablet (81 mg total) by mouth daily.   atorvastatin 80 MG tablet Commonly known as:  LIPITOR Take 80 mg by mouth daily.   B-12 5000 MCG Caps Take 5,000 mcg by mouth every morning.   calcium carbonate 500 MG chewable tablet Commonly known as:  TUMS - dosed in mg elemental calcium Chew 1 tablet by mouth as needed for indigestion or heartburn.   carvedilol 3.125 MG tablet Commonly known as:  COREG Take 1 tablet (3.125 mg  total) by mouth 2 (two) times daily with a meal.   cholecalciferol 400 units Tabs tablet Commonly known as:  VITAMIN D Take 400 Units by mouth daily.   clopidogrel 75 MG tablet Commonly known as:  PLAVIX Take 75 mg by mouth daily.   lidocaine-prilocaine cream Commonly known as:  EMLA Apply 1 application topically daily as needed (port access). Apply to affected area once   phenytoin 300 MG ER capsule Commonly known as:  DILANTIN Take 1 capsule (300 mg total) by mouth every morning.   pyridOXINE 100 MG tablet Commonly known as:  VITAMIN B-6 Take 100 mg by mouth every morning.   ranitidine 150 MG tablet Commonly known as:  ZANTAC Take 150 mg by mouth as needed for heartburn.   vitamin B-1 250 MG tablet Take 250 mg by mouth every morning.       Allergies:  Allergies  Allergen Reactions  . Oxycodone Swelling and Other (See Comments)    Tongue and lip    Past Medical History, Surgical history, Social history, and Family History were reviewed and updated.  Review of Systems: As stated in the interim history  Physical Exam:  weight is 134 lb 12.8 oz (61.1 kg). His oral temperature is 98.3 F (  36.8 C). His blood pressure is 122/58 (abnormal) and his pulse is 74. His respiration is 20 and oxygen saturation is 98%.   Wt Readings from Last 3 Encounters:  04/20/17 134 lb 12.8 oz (61.1 kg)  04/08/17 132 lb 4.4 oz (60 kg)  03/26/17 134 lb 6.4 oz (61 kg)    Thin but well-nourished white male. Head and neck exam shows no ocular or oral lesions. There are no palpable cervical or supraclavicular lymph nodes. Lungs are clear bilaterally. He has no rales, wheezes or rhonchi. Cardiac exam regular rate and rhythm with no murmurs, rubs or bruits. Abdomen is soft. He has good bowel sounds. There is no guarding or rebound tenderness. There is no palpable liver or spleen tip. Back exam shows no tenderness over the spine, ribs or hips. Breast exam does show a mass in the left breast  tissue. This is at the 3:00 position adjacent to the areola. It is slightly tender. It is mobile. There is no axillary adenopathy. Extremities shows no clubbing, cyanosis or edema. Has some osteoarthritic changes in his joints. Skin exam shows no rashes, ecchymoses or petechia. Neurological exam shows no focal neurological deficits.    Lab Results  Component Value Date   WBC 5.4 04/20/2017   HGB 10.4 (L) 04/20/2017   HCT 29.8 (L) 04/20/2017   MCV 93 04/20/2017   PLT 115 (L) 04/20/2017   Lab Results  Component Value Date   FERRITIN 617 (H) 06/30/2016   IRON 63 06/30/2016   TIBC 169 (L) 06/30/2016   UIBC 106 (L) 06/30/2016   IRONPCTSAT 37 06/30/2016   Lab Results  Component Value Date   RBC 3.20 (L) 04/20/2017   No results found for: KPAFRELGTCHN, LAMBDASER, KAPLAMBRATIO No results found for: IGGSERUM, IGA, IGMSERUM No results found for: Ronnald Ramp, A1GS, A2GS, Violet Baldy, MSPIKE, SPEI   Chemistry      Component Value Date/Time   NA 140 04/20/2017 1417   K 3.6 04/20/2017 1417   CL 109 (H) 04/20/2017 1417   CO2 27 04/20/2017 1417   BUN 15 04/20/2017 1417   CREATININE 1.3 (H) 04/20/2017 1417      Component Value Date/Time   CALCIUM 8.8 04/20/2017 1417   ALKPHOS 104 (H) 04/20/2017 1417   AST 29 04/20/2017 1417   ALT 27 04/20/2017 1417   BILITOT 0.50 04/20/2017 1417     Impression and Plan: Gilbert [name] is a pleasant 66 yo white male with history of 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 He has now been diagnosed with diffuse large cell non-Hodgkin's lymphoma. Cytogenetics were negative for "double hit" lymphoma.   He has been treated with 8 cycles of R-CEOP. This was completed in late November 2017.Marland Kitchen  Had a repeat echocardiogram done in October. This showed an ejection fraction of 40-45%.  From my point of view, everything looks okay with respect to the lymphoma. We also do keep in mind that he also has a history of renal  cell carcinoma.  I don't find any evidence of recurrence of either malignancy.  I'm not sure what this mass is in the left breast. The mass probably measures about 1.5 cm.  I think he needs a formal mammogram. A mammogram followed by a ultrasound probably will be necessary and then a biopsy.  We have to make sure we are following this situation closely.  I spent about 1 Ms. with he and his daughter. I explained our recommendations for the mammogram.  I told them that he might need a biopsy. They understand.  He'll get his Xgeva.  I'll like to see him back in 2 months. I want to make sure we get him back before the holidays.   I told him to make sure that he drinks a lot of fluid when he is down at [facility] for his golf outing. I told him to make sure that he has small frequent meals.   Volanda Napoleon, MD  9/19/20183:27 PM

## 2017-04-20 NOTE — Patient Instructions (Signed)
Denosumab injection What is this medicine? DENOSUMAB (den oh sue mab) slows bone breakdown. Prolia is used to treat osteoporosis in women after menopause and in men. Delton See is used to treat a high calcium level due to cancer and to prevent bone fractures and other bone problems caused by multiple myeloma or cancer bone metastases. Delton See is also used to treat giant cell tumor of the bone. This medicine Blackard be used for other purposes; ask your health care provider or pharmacist if you have questions. COMMON BRAND NAME(S): Prolia, XGEVA What should I tell my health care provider before I take this medicine? They need to know if you have any of these conditions: -dental disease -having surgery or tooth extraction -infection -kidney disease -low levels of calcium or Vitamin D in the blood -malnutrition -on hemodialysis -skin conditions or sensitivity -thyroid or parathyroid disease -an unusual reaction to denosumab, other medicines, foods, dyes, or preservatives -pregnant or trying to get pregnant -breast-feeding How should I use this medicine? This medicine is for injection under the skin. It is given by a health care professional in a hospital or clinic setting. If you are getting Prolia, a special MedGuide will be given to you by the pharmacist with each prescription and refill. Be sure to read this information carefully each time. For Prolia, talk to your pediatrician regarding the use of this medicine in children. Special care Grater be needed. For Delton See, talk to your pediatrician regarding the use of this medicine in children. While this drug Cowing be prescribed for children as young as 13 years for selected conditions, precautions do apply. Overdosage: If you think you have taken too much of this medicine contact a poison control center or emergency room at once. NOTE: This medicine is only for you. Do not share this medicine with others. What if I miss a dose? It is important not to miss your  dose. Call your doctor or health care professional if you are unable to keep an appointment. What Wissmann interact with this medicine? Do not take this medicine with any of the following medications: -other medicines containing denosumab This medicine Goettel also interact with the following medications: -medicines that lower your chance of fighting infection -steroid medicines like prednisone or cortisone This list Mannes not describe all possible interactions. Give your health care provider a list of all the medicines, herbs, non-prescription drugs, or dietary supplements you use. Also tell them if you smoke, drink alcohol, or use illegal drugs. Some items Fontan interact with your medicine. What should I watch for while using this medicine? Visit your doctor or health care professional for regular checks on your progress. Your doctor or health care professional [place] order blood tests and other tests to see how you are doing. Call your doctor or health care professional for advice if you get a fever, chills or sore throat, or other symptoms of a cold or flu. Do not treat yourself. This drug Brew decrease your body's ability to fight infection. Try to avoid being around people who are sick. You should make sure you get enough calcium and vitamin D while you are taking this medicine, unless your doctor tells you not to. Discuss the foods you eat and the vitamins you take with your health care professional. See your dentist regularly. Brush and floss your teeth as directed. Before you have any dental work done, tell your dentist you are receiving this medicine. Do not become pregnant while taking this medicine or for 5 months after stopping  it. Talk with your doctor or health care professional about your birth control options while taking this medicine. Women should inform their doctor if they wish to become pregnant or think they might be pregnant. There is a potential for serious side effects to an unborn child. Talk  to your health care professional or pharmacist for more information. What side effects Study I notice from receiving this medicine? Side effects that you should report to your doctor or health care professional as soon as possible: -allergic reactions like skin rash, itching or hives, swelling of the face, lips, or tongue -bone pain -breathing problems -dizziness -jaw pain, especially after dental work -redness, blistering, peeling of the skin -signs and symptoms of infection like fever or chills; cough; sore throat; pain or trouble passing urine -signs of low calcium like fast heartbeat, muscle cramps or muscle pain; pain, tingling, numbness in the hands or feet; seizures -unusual bleeding or bruising -unusually weak or tired Side effects that usually do not require medical attention (report to your doctor or health care professional if they continue or are bothersome): -constipation -diarrhea -headache -joint pain -loss of appetite -muscle pain -runny nose -tiredness -upset stomach This list Espejo not describe all possible side effects. Call your doctor for medical advice about side effects. You Bednarz report side effects to FDA at 1-800-FDA-1088. Where should I keep my medicine? This medicine is only given in a clinic, doctor's office, or other health care setting and will not be stored at home. NOTE: This sheet is a summary. It Brinkley not cover all possible information. If you have questions about this medicine, talk to your doctor, pharmacist, or health care provider.  2018 Elsevier/Gold Standard (2016-08-10 19:17:21)

## 2017-04-21 LAB — LACTATE DEHYDROGENASE: LDH: 179 U/L (ref 125–245)

## 2017-06-01 ENCOUNTER — Emergency Department (HOSPITAL_COMMUNITY): Payer: Medicare Other

## 2017-06-01 ENCOUNTER — Encounter: Payer: Self-pay | Admitting: Physician Assistant

## 2017-06-01 ENCOUNTER — Other Ambulatory Visit: Payer: Self-pay

## 2017-06-01 ENCOUNTER — Ambulatory Visit (INDEPENDENT_AMBULATORY_CARE_PROVIDER_SITE_OTHER): Payer: Medicare Other | Admitting: Physician Assistant

## 2017-06-01 ENCOUNTER — Ambulatory Visit: Payer: Medicare Other

## 2017-06-01 ENCOUNTER — Inpatient Hospital Stay (HOSPITAL_COMMUNITY)
Admission: EM | Admit: 2017-06-01 | Discharge: 2017-06-03 | DRG: 292 | Disposition: A | Discharge status: Home / Self Care | Payer: Medicare Other | Attending: Cardiology | Admitting: Cardiology

## 2017-06-01 ENCOUNTER — Encounter (HOSPITAL_COMMUNITY): Payer: Self-pay | Admitting: *Deleted

## 2017-06-01 VITALS — BP 124/72 | HR 87 | Temp 98.4°F | Ht 68.5 in | Wt 147.8 lb

## 2017-06-01 DIAGNOSIS — I739 Peripheral vascular disease, unspecified: Secondary | ICD-10-CM | POA: Diagnosis present

## 2017-06-01 DIAGNOSIS — I5043 Acute on chronic combined systolic (congestive) and diastolic (congestive) heart failure: Secondary | ICD-10-CM | POA: Diagnosis present

## 2017-06-01 DIAGNOSIS — Z955 Presence of coronary angioplasty implant and graft: Secondary | ICD-10-CM | POA: Diagnosis not present

## 2017-06-01 DIAGNOSIS — Z8249 Family history of ischemic heart disease and other diseases of the circulatory system: Secondary | ICD-10-CM

## 2017-06-01 DIAGNOSIS — I255 Ischemic cardiomyopathy: Secondary | ICD-10-CM | POA: Diagnosis present

## 2017-06-01 DIAGNOSIS — I110 Hypertensive heart disease with heart failure: Principal | ICD-10-CM | POA: Diagnosis present

## 2017-06-01 DIAGNOSIS — K219 Gastro-esophageal reflux disease without esophagitis: Secondary | ICD-10-CM | POA: Diagnosis present

## 2017-06-01 DIAGNOSIS — R05 Cough: Secondary | ICD-10-CM | POA: Diagnosis not present

## 2017-06-01 DIAGNOSIS — Z79899 Other long term (current) drug therapy: Secondary | ICD-10-CM | POA: Diagnosis not present

## 2017-06-01 DIAGNOSIS — Z7982 Long term (current) use of aspirin: Secondary | ICD-10-CM

## 2017-06-01 DIAGNOSIS — Z8572 Personal history of non-Hodgkin lymphomas: Secondary | ICD-10-CM

## 2017-06-01 DIAGNOSIS — F17220 Nicotine dependence, chewing tobacco, uncomplicated: Secondary | ICD-10-CM | POA: Diagnosis present

## 2017-06-01 DIAGNOSIS — Z681 Body mass index (BMI) 19 or less, adult: Secondary | ICD-10-CM | POA: Diagnosis not present

## 2017-06-01 DIAGNOSIS — R011 Cardiac murmur, unspecified: Secondary | ICD-10-CM | POA: Diagnosis present

## 2017-06-01 DIAGNOSIS — I509 Heart failure, unspecified: Secondary | ICD-10-CM

## 2017-06-01 DIAGNOSIS — R64 Cachexia: Secondary | ICD-10-CM | POA: Diagnosis present

## 2017-06-01 DIAGNOSIS — Z905 Acquired absence of kidney: Secondary | ICD-10-CM

## 2017-06-01 DIAGNOSIS — Z7902 Long term (current) use of antithrombotics/antiplatelets: Secondary | ICD-10-CM | POA: Diagnosis not present

## 2017-06-01 DIAGNOSIS — J189 Pneumonia, unspecified organism: Secondary | ICD-10-CM

## 2017-06-01 DIAGNOSIS — Z825 Family history of asthma and other chronic lower respiratory diseases: Secondary | ICD-10-CM

## 2017-06-01 DIAGNOSIS — E785 Hyperlipidemia, unspecified: Secondary | ICD-10-CM | POA: Diagnosis present

## 2017-06-01 DIAGNOSIS — I252 Old myocardial infarction: Secondary | ICD-10-CM

## 2017-06-01 DIAGNOSIS — Z823 Family history of stroke: Secondary | ICD-10-CM

## 2017-06-01 DIAGNOSIS — I2510 Atherosclerotic heart disease of native coronary artery without angina pectoris: Secondary | ICD-10-CM | POA: Diagnosis present

## 2017-06-01 DIAGNOSIS — Z85528 Personal history of other malignant neoplasm of kidney: Secondary | ICD-10-CM

## 2017-06-01 DIAGNOSIS — Z885 Allergy status to narcotic agent status: Secondary | ICD-10-CM

## 2017-06-01 DIAGNOSIS — G40909 Epilepsy, unspecified, not intractable, without status epilepticus: Secondary | ICD-10-CM | POA: Diagnosis present

## 2017-06-01 DIAGNOSIS — Z87448 Personal history of other diseases of urinary system: Secondary | ICD-10-CM | POA: Diagnosis not present

## 2017-06-01 DIAGNOSIS — R0602 Shortness of breath: Secondary | ICD-10-CM

## 2017-06-01 DIAGNOSIS — Z8349 Family history of other endocrine, nutritional and metabolic diseases: Secondary | ICD-10-CM

## 2017-06-01 DIAGNOSIS — Z8674 Personal history of sudden cardiac arrest: Secondary | ICD-10-CM

## 2017-06-01 DIAGNOSIS — R000 Tachycardia, unspecified: Secondary | ICD-10-CM | POA: Diagnosis present

## 2017-06-01 LAB — HEPATIC FUNCTION PANEL
ALT: 21 U/L (ref 17–63)
AST: 30 U/L (ref 15–41)
Albumin: 3.3 g/dL — ABNORMAL LOW (ref 3.5–5.0)
Alkaline Phosphatase: 127 U/L — ABNORMAL HIGH (ref 38–126)
BILIRUBIN DIRECT: 0.1 mg/dL (ref 0.1–0.5)
Indirect Bilirubin: 0.6 mg/dL (ref 0.3–0.9)
TOTAL PROTEIN: 6.3 g/dL — AB (ref 6.5–8.1)
Total Bilirubin: 0.7 mg/dL (ref 0.3–1.2)

## 2017-06-01 LAB — BASIC METABOLIC PANEL
ANION GAP: 12 (ref 5–15)
BUN: 14 mg/dL (ref 6–20)
CHLORIDE: 103 mmol/L (ref 101–111)
CO2: 24 mmol/L (ref 22–32)
Calcium: 8.4 mg/dL — ABNORMAL LOW (ref 8.9–10.3)
Creatinine, Ser: 1.33 mg/dL — ABNORMAL HIGH (ref 0.61–1.24)
GFR, EST NON AFRICAN AMERICAN: 54 mL/min — AB (ref >60)
Glucose, Bld: 98 mg/dL (ref 65–99)
POTASSIUM: 3.8 mmol/L (ref 3.5–5.1)
SODIUM: 139 mmol/L (ref 135–145)

## 2017-06-01 LAB — CBC
HEMATOCRIT: 38.2 % — AB (ref 39.0–52.0)
HEMOGLOBIN: 12.5 g/dL — AB (ref 13.0–17.0)
MCH: 30.4 pg (ref 26.0–34.0)
MCHC: 32.7 g/dL (ref 30.0–36.0)
MCV: 92.9 fL (ref 78.0–100.0)
Platelets: 196 10*3/uL (ref 150–400)
RBC: 4.11 MIL/uL — AB (ref 4.22–5.81)
RDW: 13.7 % (ref 11.5–15.5)
WBC: 10.7 10*3/uL — AB (ref 4.0–10.5)

## 2017-06-01 LAB — I-STAT CG4 LACTIC ACID, ED
Lactic Acid, Venous: 1.04 mmol/L (ref 0.5–1.9)
Lactic Acid, Venous: 0.95 mmol/L (ref 0.5–1.9)

## 2017-06-01 LAB — TROPONIN I: Troponin I: 0.07 ng/mL (ref <0.03)

## 2017-06-01 LAB — BRAIN NATRIURETIC PEPTIDE

## 2017-06-01 LAB — PHENYTOIN LEVEL, TOTAL: PHENYTOIN LVL: 3.7 ug/mL — AB (ref 10.0–20.0)

## 2017-06-01 MED ORDER — HEPARIN SODIUM (PORCINE) 5000 UNIT/ML IJ SOLN
5000.0000 [IU] | Freq: Three times a day (TID) | INTRAMUSCULAR | Status: DC
  Administered 2017-06-01 – 2017-06-03 (×4): 5000 [IU] via SUBCUTANEOUS
  Filled 2017-06-01 (×4): qty 1

## 2017-06-01 MED ORDER — ACETAMINOPHEN 500 MG PO TABS: 500.0000 mg | ORAL_TABLET | Freq: Four times a day (QID) | ORAL | Status: DC | PRN

## 2017-06-01 MED ORDER — FUROSEMIDE 10 MG/ML IJ SOLN
40.0000 mg | Freq: Four times a day (QID) | INTRAMUSCULAR | Status: DC
  Administered 2017-06-01 – 2017-06-02 (×2): 40 mg via INTRAVENOUS
  Filled 2017-06-01 (×2): qty 4

## 2017-06-01 MED ORDER — VITAMIN B-6 100 MG PO TABS
100.0000 mg | ORAL_TABLET | Freq: Every morning | ORAL | Status: DC
  Administered 2017-06-02 – 2017-06-03 (×2): 100 mg via ORAL
  Filled 2017-06-01 (×2): qty 1

## 2017-06-01 MED ORDER — ACETAMINOPHEN 325 MG PO TABS
650.0000 mg | ORAL_TABLET | ORAL | Status: DC | PRN
Start: 2017-06-01 — End: 2017-06-03
  Administered 2017-06-02: 650 mg via ORAL
  Filled 2017-06-01: qty 2

## 2017-06-01 MED ORDER — IPRATROPIUM BROMIDE 0.02 % IN SOLN: 0.5000 mg | Freq: Once | RESPIRATORY_TRACT | Status: DC

## 2017-06-01 MED ORDER — LISINOPRIL 10 MG PO TABS: 5.0000 mg | ORAL_TABLET | Freq: Every day | ORAL | Status: DC

## 2017-06-01 MED ORDER — CLOPIDOGREL BISULFATE 75 MG PO TABS
75.0000 mg | ORAL_TABLET | Freq: Every day | ORAL | Status: DC
  Administered 2017-06-02 – 2017-06-03 (×2): 75 mg via ORAL
  Filled 2017-06-01 (×2): qty 1

## 2017-06-01 MED ORDER — PHENYTOIN SODIUM EXTENDED 100 MG PO CAPS
300.0000 mg | ORAL_CAPSULE | Freq: Every morning | ORAL | Status: DC
  Administered 2017-06-02 – 2017-06-03 (×2): 300 mg via ORAL
  Filled 2017-06-01 (×2): qty 3

## 2017-06-01 MED ORDER — DEXTROSE 5 % IV SOLN
2.0000 g | Freq: Once | INTRAVENOUS | Status: AC
  Administered 2017-06-01: 2 g via INTRAVENOUS
  Filled 2017-06-01: qty 2

## 2017-06-01 MED ORDER — NITROGLYCERIN 2 % TD OINT
1.0000 [in_us] | TOPICAL_OINTMENT | Freq: Four times a day (QID) | TRANSDERMAL | Status: DC
  Administered 2017-06-02 (×2): 1 [in_us] via TOPICAL
  Filled 2017-06-01 (×9): qty 30

## 2017-06-01 MED ORDER — FUROSEMIDE 10 MG/ML IJ SOLN
40.0000 mg | Freq: Once | INTRAMUSCULAR | Status: AC
  Administered 2017-06-01: 40 mg via INTRAVENOUS
  Filled 2017-06-01: qty 4

## 2017-06-01 MED ORDER — LIDOCAINE-PRILOCAINE 2.5-2.5 % EX CREA: 1.0000 "application " | TOPICAL_CREAM | Freq: Every day | CUTANEOUS | Status: DC | PRN

## 2017-06-01 MED ORDER — ALBUTEROL SULFATE (2.5 MG/3ML) 0.083% IN NEBU: 2.5000 mg | INHALATION_SOLUTION | Freq: Once | RESPIRATORY_TRACT | Status: DC

## 2017-06-01 MED ORDER — SODIUM CHLORIDE 0.9 % IV SOLN: 250.0000 mL | INTRAVENOUS | Status: DC | PRN

## 2017-06-01 MED ORDER — B-12 5000 MCG PO CAPS: 5000.0000 ug | ORAL_CAPSULE | Freq: Every morning | ORAL | Status: DC

## 2017-06-01 MED ORDER — ASPIRIN EC 81 MG PO TBEC
81.0000 mg | DELAYED_RELEASE_TABLET | Freq: Every day | ORAL | Status: DC
  Administered 2017-06-02 – 2017-06-03 (×2): 81 mg via ORAL
  Filled 2017-06-01 (×2): qty 1

## 2017-06-01 MED ORDER — FAMOTIDINE 20 MG PO TABS
40.0000 mg | ORAL_TABLET | Freq: Every day | ORAL | Status: DC
  Administered 2017-06-02 – 2017-06-03 (×2): 40 mg via ORAL
  Filled 2017-06-01 (×2): qty 2

## 2017-06-01 MED ORDER — SODIUM CHLORIDE 0.9% FLUSH
3.0000 mL | INTRAVENOUS | Status: DC | PRN
  Administered 2017-06-02: 10 mL via INTRAVENOUS
  Filled 2017-06-01: qty 3

## 2017-06-01 MED ORDER — CHOLECALCIFEROL 10 MCG (400 UNIT) PO TABS
400.0000 [IU] | ORAL_TABLET | Freq: Every day | ORAL | Status: DC
  Administered 2017-06-02 – 2017-06-03 (×2): 400 [IU] via ORAL
  Filled 2017-06-01 (×2): qty 1

## 2017-06-01 MED ORDER — ASPIRIN 81 MG PO CHEW
324.0000 mg | CHEWABLE_TABLET | Freq: Once | ORAL | Status: AC
  Administered 2017-06-01: 324 mg via ORAL
  Filled 2017-06-01: qty 4

## 2017-06-01 MED ORDER — VANCOMYCIN HCL IN DEXTROSE 1-5 GM/200ML-% IV SOLN
1000.0000 mg | Freq: Once | INTRAVENOUS | Status: AC
  Administered 2017-06-01: 1000 mg via INTRAVENOUS
  Filled 2017-06-01: qty 200

## 2017-06-01 MED ORDER — VITAMIN B-1 100 MG PO TABS
250.0000 mg | ORAL_TABLET | Freq: Every morning | ORAL | Status: DC
  Administered 2017-06-02 – 2017-06-03 (×2): 250 mg via ORAL
  Filled 2017-06-01 (×2): qty 3

## 2017-06-01 MED ORDER — CARVEDILOL 6.25 MG PO TABS
6.2500 mg | ORAL_TABLET | Freq: Two times a day (BID) | ORAL | Status: DC
  Administered 2017-06-02 – 2017-06-03 (×3): 6.25 mg via ORAL
  Filled 2017-06-01 (×3): qty 1

## 2017-06-01 MED ORDER — ATORVASTATIN CALCIUM 80 MG PO TABS
80.0000 mg | ORAL_TABLET | Freq: Every day | ORAL | Status: DC
  Administered 2017-06-02 – 2017-06-03 (×2): 80 mg via ORAL
  Filled 2017-06-01 (×2): qty 1

## 2017-06-01 MED ORDER — SODIUM CHLORIDE 0.9% FLUSH
3.0000 mL | Freq: Two times a day (BID) | INTRAVENOUS | Status: DC
  Administered 2017-06-01 – 2017-06-03 (×3): 3 mL via INTRAVENOUS

## 2017-06-01 MED ORDER — VITAMIN B-12 1000 MCG PO TABS
5000.0000 ug | ORAL_TABLET | Freq: Every day | ORAL | Status: DC
  Administered 2017-06-02 – 2017-06-03 (×2): 5000 ug via ORAL
  Filled 2017-06-01 (×2): qty 5

## 2017-06-01 MED ORDER — IPRATROPIUM-ALBUTEROL 0.5-2.5 (3) MG/3ML IN SOLN
3.0000 mL | Freq: Once | RESPIRATORY_TRACT | Status: AC
  Administered 2017-06-01: 3 mL via RESPIRATORY_TRACT
  Filled 2017-06-01: qty 3

## 2017-06-01 MED ORDER — CALCIUM CARBONATE ANTACID 500 MG PO CHEW: 1.0000 | CHEWABLE_TABLET | ORAL | Status: DC | PRN

## 2017-06-01 MED ORDER — SPIRONOLACTONE 25 MG PO TABS
25.0000 mg | ORAL_TABLET | Freq: Every day | ORAL | Status: DC
  Administered 2017-06-01 – 2017-06-03 (×3): 25 mg via ORAL
  Filled 2017-06-01 (×3): qty 1

## 2017-06-01 MED ORDER — ONDANSETRON HCL 4 MG/2ML IJ SOLN
4.0000 mg | Freq: Four times a day (QID) | INTRAMUSCULAR | Status: DC | PRN
Start: 2017-06-01 — End: 2017-06-03

## 2017-06-01 NOTE — ED Notes (Signed)
Patient transported to X-ray 

## 2017-06-01 NOTE — Progress Notes (Addendum)
Gilbert [name]  MRN: [id number] DOB: 1951/06/29  Subjective:  Gilbert [name] is a 66 y.o. 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 Had flu and pneumonia shot 5 days and he felt like this two days later. He feels as if he cannot get a deep breath. It is worse when he is sitting down. He is trying to make himself cough and when he does he will have chest pain. Has DOE and difficulty lying down due to SOB. Has had some nasal congestion and sore throat as well. Denies fever, chills, nausea, vomting, exertional chest pain, heart palpitiatons, and lower leg swelling. Former smoker. Quit 02/02/2001. Smoked 3 packs a day for 40 years prior to this. Had MI 10/2013. No hx of asthma or COPD. Last saw Dr. Nadyne Coombes, cardiology, 3 weeks ago and said everything was good. Went to [employer] 2 weeks ago. Spent 4 hours in the car. No recent hospitalizations. He is just wanting a breathing treatment to help him breathe.    Review of Systems  Constitutional: Positive for activity change ( decreased activity). Negative for chills and diaphoresis.  HENT: Negative for sinus pain, sore throat and trouble swallowing.   Respiratory: Negative for wheezing.   Neurological: Negative for dizziness and light-headedness.    Patient Active Problem List   Diagnosis Date Noted  . AKI (acute kidney injury) ([place]) 04/07/2017  . Orthostatic hypotension 04/07/2017  . Diffuse non-Hodgkin's lymphoma of bone ([place]) 01/08/2016  . Diffuse large cell non-Hodgkin's lymphoma ([place]) 01/07/2016  . 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 Hematuria 10/29/2015  . Dysphagia, pharyngoesophageal phase   . Early satiety   . Esophageal stricture   . Gastritis and  gastroduodenitis   . PAD (peripheral artery disease) ([place]) 05/28/2014  . Cardiomyopathy, ischemic 04/03/2014  . History of left heart catheterization (LHC) 02/26/2014  . Arterial bleed, intraoperative 02/26/2014  . Seizures ([place]) 11/27/2013  . HTN (hypertension) 11/27/2013  . Hypomagnesemia 11/27/2013  . CAD (coronary artery disease), native coronary artery 11/26/2013  . Renal cancer ([place]) 10/19/2013  . Coronary artery calcification 09/25/2013  . Renal cyst 08/30/2013  . Loss of weight 08/03/2013    Current Outpatient Prescriptions on File Prior to Visit  Medication Sig Dispense Refill  . acetaminophen (TYLENOL) 500 MG tablet Take 500 mg by mouth every 6 (six) hours as needed.    Marland Kitchen aspirin EC 81 MG tablet Take 1 tablet (81 mg total) by mouth daily. (Patient taking differently: Take 81 mg by mouth every morning. )    . atorvastatin (LIPITOR) 80 MG tablet Take 80 mg by mouth daily.  3  . calcium carbonate (TUMS - DOSED IN MG ELEMENTAL CALCIUM) 500 MG chewable tablet Chew 1 tablet by mouth as needed for indigestion or heartburn.    . cholecalciferol (VITAMIN D) 400 units TABS tablet Take 400 Units by mouth daily.    . clopidogrel (PLAVIX) 75 MG tablet Take 75 mg by mouth daily.   0  . Cyanocobalamin (B-12) 5000 MCG CAPS Take 5,000 mcg by mouth every morning.     . lidocaine-prilocaine (EMLA) cream Apply 1 application topically daily as needed (port access). Apply to affected area once 30 g 5  .  phenytoin (DILANTIN) 300 MG ER capsule Take 1 capsule (300 mg total) by mouth every morning. 90 capsule 1  . pyridOXINE (VITAMIN B-6) 100 MG tablet Take 100 mg by mouth every morning.     . ranitidine (ZANTAC) 150 MG tablet Take 150 mg by mouth as needed for heartburn.    . Thiamine HCl (VITAMIN B-1) 250 MG tablet Take 250 mg by mouth every morning.     . carvedilol (COREG) 3.125 MG tablet Take 1 tablet (3.125 mg total) by mouth 2 (two) times daily with a meal. 60 tablet 0   Current  Facility-Administered Medications on File Prior to Visit  Medication Dose Route Frequency Provider Last Rate Last Dose  . sodium chloride flush (NS) 0.9 % injection 10 mL  10 mL Intravenous PRN Volanda Napoleon, MD   10 mL at 05/05/16 1127  . sodium chloride flush (NS) 0.9 % injection 10 mL  10 mL Intracatheter PRN Volanda Napoleon, MD   10 mL at 06/10/16 1311    Allergies  Allergen Reactions  . Oxycodone Swelling and Other (See Comments)    Tongue and lip     Objective:  BP 124/72 (BP Location: Left Arm, Patient Position: Sitting, Cuff Size: Normal)   Pulse 87   Temp 98.4 F (36.9 C) (Oral)   Ht 5' 8.5" (1.74 m)   Wt 147 lb 12.8 oz (67 kg)   SpO2 97%   BMI 22.14 kg/m   Physical Exam  Constitutional: He is oriented to person, place, and time. He appears unhealthy. He appears cachectic.  HENT:  Head: Normocephalic and atraumatic.  Right Ear: Tympanic membrane, external ear and ear canal normal.  Left Ear: Tympanic membrane, external ear and ear canal normal.  Nose: Mucosal edema (mild bilaterally) present.  Mouth/Throat: Uvula is midline, oropharynx is clear and moist and mucous membranes are normal.  Eyes: Conjunctivae are normal.  Neck: Normal range of motion.  Cardiovascular: Normal rate, regular rhythm, normal heart sounds and normal pulses.   Pulmonary/Chest: Tachypnea noted. He has no decreased breath sounds. He has no wheezes. He has no rhonchi. He has no rales.  Coarse breath sounds auscultated bilaterally.   Labored breathing noted while pt is sitting on exam table.   Orthopnea noted during EKG test.   Musculoskeletal:       Right lower leg: He exhibits no tenderness and no swelling.       Left lower leg: He exhibits no tenderness and no swelling.  Lymphadenopathy:       Head (right side): No submental, no submandibular, no tonsillar, no preauricular, no posterior auricular and no occipital adenopathy present.       Head (left side): No submental, no  submandibular, no tonsillar, no preauricular, no posterior auricular and no occipital adenopathy present.    He has no cervical adenopathy.       Right: No supraclavicular adenopathy present.       Left: No supraclavicular adenopathy present.  Neurological: He is alert and oriented to person, place, and time. Gait normal.  Skin: Skin is warm and dry.  Psychiatric: Affect normal.  Vitals reviewed.  EKG shows junctional rhythm  with rate of 94 bpm.. P:QRS - 1:1, Superior P axis, Short PRi. Possible left ventricular hypertrophy and diffuse nonspecific T-abnormality. These are new findings compared to prior EKG of 04/07/2017. Findings presented and discussed with Dr. Franky Macho.   Last NM PET Image Restag Skull Base to Thigh on 07/29/17 with  IMPRESSION: 1. No  specific findings identified to suggest residual/recurrent hypermetabolic tumor. 2. Similar appearance of increased uptake within right lobe of thyroid gland nodule. Hypermetabolic thyroid nodules on PET have up to 40-50% incidence of malignancy; recommend further evaluation with thyroid ultrasound and possible US-guided fine needle aspiration. 3. Persistent increased uptake associated with the mid and distal esophagus of uncertain clinical significance. 4. Aortic atherosclerosis.  Assessment and Plan :  This case was precepted with Dr. Mitchel Honour.  1. Shortness of breath Due to PMH, hx of sudden onset shortness of breath, new nonspecific abnormal EKG findings, and physical exam findings of labored breathing while sitting and orthopnea,  he warrants further evaluation by ED. DDx includes PE, MI, CHF exacerbation, and pneumonia which is less likely as pt is afebrile and does not have a cough. His vitals are stable at this time but due to his labored breathing it was recommended he go to the ED via EMS due to possibility of decompensation. Pt refused EMS but agreed to have his coworker, who drove him here today, to take him to [facility] ED.  Contacted [facility] charge nurse, Jerene Pitch, and made her aware of patient's case and that he was arriving via personal transport.  - EKG 12-Lead  Tenna Delaine PA-C  [facility] at [facility] 06/01/2017 2:52 PM

## 2017-06-01 NOTE — ED Notes (Signed)
Pt reports SOB, no wheezing heard, O2 on RA is 97%. Pt put on 2L [place] for comfort. ED Provider at bedside.

## 2017-06-01 NOTE — Patient Instructions (Addendum)
Due to your symptoms of sudden onset shortness of breath and your abnormal EKG findings, I recommend to go emergently to the ED for further evaluation and treatment.  Please go to [facility] ED.  I have contacted them and let them know about your arrival.    IF you received an x-ray today, you will receive an invoice from [facility]. Please contact [facility] at [phone] with questions or concerns regarding your invoice.   IF you received labwork today, you will receive an invoice from [facility]. Please contact [facility] at [phone] with questions or concerns regarding your invoice.   Our billing staff will not be able to assist you with questions regarding bills from these companies.  You will be contacted with the lab results as soon as they are available. The fastest way to get your results is to activate your My Chart account. Instructions are located on the last page of this paperwork. If you have not heard from [place] regarding the results in 2 weeks, please contact this office.

## 2017-06-01 NOTE — H&P (Addendum)
Gilbert [name] is an 66 y.o. male.   Chief Complaint: Shortness of breath  HPI:   66 y/o ,male w/CAD s/p MI, LM stenting 2015, ischemic cardiomyopathy EF 30%, PAD s/p Left external iliac/common femoral stenting 2015, h/o nephrectomy for renal cell carcinoma, h/o NHL, currently in remission. For last four days, he has had worsening shortness of breath and leg edema. He denies any fever, chills, nausea, vomiting, or productive cough. He does not have any chest pain, presyncope, syncope.  He has also had orthopnea and PND.  He is only been on carvedilol 3.125 mg as he had hospital admission in September 2018 with orthostasis and lightheadedness after which his Benicar and amlodipine were stopped.   BNP in the ED was >4500. He was given one dose of cefepime suspecting pneumonia on chest Xray. No impressive leukocytosis. Blood cultures pending.   Echocardiogram 03/30/2017: 1. Left ventricle cavity is normal in size. Mild concentric hypertrophy of the left ventricle. Elevated LAP. LAD territory severe hypokinesis. Moderate global hypokinesis Calculated EF 30%. 2. Inadequate tricuspid regurgitation jet to estimate pulmonary artery pressure 3. No significant change compared to prior study dated 08/17/2016  Carotid artery duplex 01/13/2017: Mild stenosis in the right common carotid artery (<50%). Stenosis in the left internal carotid artery (50-69%). Mild stenosis in the left external carotid artery (<50%). Antegrade right vertebral artery flow. Antegrade left vertebral artery flow. Compared to the study done on 07/14/2016, right ICA stenosis of 50-69% is not well appreciated, left ICA stenosis of less than 50% is now  50-69% range Follow up in six months is appropriate if clinically indicated.  Lexiscan sestamibi stress test 05/09/2015: 1. The resting electrocardiogram demonstrated normal sinus rhythm, LVH, no resting arrhythmias and nonspecific ST-T changes.  Cannot exclude lateral ischemia. Stress  EKG is nondiagnostic for ischemia visits for pharmacologic stress test.  Patient initially attempted treadmill stress test, but unable to complete greater than 2 minutes due to leg fatigue.  Stress converted to [place]. There were occasional PVCs. 2. Perfusion imaging study demonstrates a moderate to severely ischemia distal anteroseptal wall, basal inferolateral wall.  The defect size is small to medium.  The left ventricle is dilated with mild global hypokinesis and anterolateral hypokinesis.  Left ventricle and diastolic volume was 948 mL. The left ventricle systolic function calculated by QGS was 30%. This represents a high risk study in view of history of left main stenting and proximal LAD stenting.  Consider further cardiac workup.  06/10/2015: Procedures   Left Heart Cath and Coronary Angiography  Conclusion   LM: DES stent placed on 11/27/2013 widely patent.  LAD: Ostial and proximal LAD stent widely patent again placed on 11/27/2013. Mid to distal segment shows very mild luminal irregularity. Gives origin to several small diagonals. There is collateralization from the left to the distal right coronary artery. Also gives collaterals to the occluded mid to distal circumflex coronary artery.  Ramus intermediate: Large vessel. Midsegment of the ramus intermediate shows mild 20-30% diffuse % luminal irregularity, proximal segment shows a 40% stenosis.  Circumflex coronary artery: A small vessel. Severely diffusely diseased. Ostium has a 70-80% stenosis in some new appears to be 90% unchanged from prior angiography. Occluded in the midsegment. Collaterals of obtuse marginal is evident with excellent filling of the distal circumflex.  Right coronary artery co-dominant vessel, it is occluded after the origin of the RV branch. Type II collaterals evident from the left to the right coronary artery. The RV branch has diffuse disease, mid segment has  90% stenosis, again unchanged from prior  angiography in April 2015.   Impression: Abnormal stress test due to occluded distal circumflex and right coronary artery, this is chronic total occlusion, I will review the angiograms with Dr. Casandra Doffing to see if PCI to CTO RCA and Cx would be appropriate. Will continue medical therapy.       Past Medical History:  Diagnosis Date  . Abnormal liver enzymes    Chronic alkaline phosphatase elevation since 2014  . Acid reflux   . CAD (coronary artery disease)   . Cancer ([place]) 08/2013   right kidney cancer  . Cancer ([place])   . Coronary artery calcification seen on CAT scan   . Coronary atherosclerosis of native coronary artery   . Diffuse large cell non-Hodgkin's lymphoma ([place]) 01/07/2016  . Diffuse non-Hodgkin's lymphoma of bone ([place]) 01/08/2016  . Essential hypertension, benign   . Family history of anesthesia complication    mother-had stroke during anesthesia  . GERD (gastroesophageal reflux disease)   . Gout   . History of cardiac arrest 11/26/13   PTCA/Stenting of LM & Prox LAD with 4.0 x 18 mm Xience alpine stent. Used Impella Circulatory assist device. EF= 20-25%  . History of epilepsy    at least 10 years ago. 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 History of myocardial infarction less than 8 weeks    Stent placed  . History of nephrectomy 11/2013   For renal cell carcinoma  . History of tobacco use   . HTN (hypertension)   . Hypercholesteremia   . Hyperlipemia   . Hypertension   . PAD (peripheral artery disease) ([place])   . Seizures ([place])   . Shortness of breath    with activity  . SOB (shortness of breath)   . Systolic and diastolic CHF, chronic (HCC)    Resolved. EF 45% 04/10/2014  . Therapeutic drug monitoring     Past Surgical History:  Procedure Laterality Date  . BALLOON DILATION N/A 01/21/2015   Procedure: BALLOON DILATION;  Surgeon: Gatha Mayer, MD;  Location[facility] ENDOSCOPY;  Service: Endoscopy;  Laterality: N/A;  . CARDIAC CATHETERIZATION N/A 06/10/2015    Procedure: Left Heart Cath and Coronary Angiography;  Surgeon: Adrian Prows, MD;  Location: [facility] CV LAB;  Service: Cardiovascular;  Laterality: N/A;  . CORONARY ANGIOPLASTY WITH STENT PLACEMENT  2015   Left main coronary artery stenting on emergent basis along with circulatory support with Impella device through left leg. With 4.0 x 18 mm Xience alpine stent  . ESOPHAGOGASTRODUODENOSCOPY    . ESOPHAGOGASTRODUODENOSCOPY N/A 01/21/2015   Procedure: ESOPHAGOGASTRODUODENOSCOPY (EGD) with possible dilation.;  Surgeon: Gatha Mayer, MD;  Location: [facility] ENDOSCOPY;  Service: Endoscopy;  Laterality: N/A;  . EYE SURGERY Left 2 weeks ago   torn retina  . ILIAC ARTERY STENT Left 05/28/2014   dr Einar Gip  . LEFT HEART CATHETERIZATION WITH CORONARY ANGIOGRAM N/A 11/27/2013   Procedure: LEFT HEART CATHETERIZATION WITH CORONARY ANGIOGRAM;  Surgeon: [name], MD;  Location: [facility] CATH LAB;  Service: Cardiovascular;  Laterality: N/A;  . LOWER EXTREMITY ANGIOGRAM N/A 02/26/2014   Procedure: LOWER EXTREMITY ANGIOGRAM;  Surgeon: [name], MD;  Location: [facility] CATH LAB;  Service: Cardiovascular;  Laterality: N/A;  . LOWER EXTREMITY ANGIOGRAM N/A 05/28/2014   Procedure: LOWER EXTREMITY ANGIOGRAM;  Surgeon: [name], MD;  Location: [facility] CATH LAB;  Service: Cardiovascular;  Laterality: N/A;  . NEPHRECTOMY  11/2013  . NO PAST SURGERIES    .  PERCUTANEOUS CORONARY STENT INTERVENTION (PCI-S)  11/27/2013   Procedure: PERCUTANEOUS CORONARY STENT INTERVENTION (PCI-S);  Surgeon: [name], MD;  Location: [facility] CATH LAB;  Service: Cardiovascular;;  . ROBOT ASSISTED LAPAROSCOPIC NEPHRECTOMY Right 10/19/2013   Procedure: ROBOTIC ASSISTED LAPAROSCOPIC NEPHRECTOMY,  EXTENSIVE ADHESIOLYSIS;  Surgeon: Alexis Frock, MD;  Location: [facility] ORS;  Service: Urology;  Laterality: Right;    Family History  Problem Relation Age of Onset  . Stroke Mother   . Heart disease Brother        multiple stents  . Hyperlipidemia  Brother   . Emphysema Father    Social History:  reports that he quit smoking about 13 years ago. His smoking use included Cigarettes. He has a 90.00 pack-year smoking history. His smokeless tobacco use includes Chew. He reports that he does not drink alcohol or use drugs.  Allergies:  Allergies  Allergen Reactions  . Oxycodone Swelling and Other (See Comments)    Tongue and lips swell     (Not in a hospital admission)  Results for orders placed or performed during the hospital encounter of 06/01/17 (from the past 48 hour(s))  Basic metabolic panel     Status: Abnormal   Collection Time: 06/01/17  5:06 PM  Result Value Ref Range   Sodium 139 135 - 145 mmol/L   Potassium 3.8 3.5 - 5.1 mmol/L   Chloride 103 101 - 111 mmol/L   CO2 24 22 - 32 mmol/L   Glucose, Bld 98 65 - 99 mg/dL   BUN 14 6 - 20 mg/dL   Creatinine, Ser 1.33 (H) 0.61 - 1.24 mg/dL   Calcium 8.4 (L) 8.9 - 10.3 mg/dL   GFR calc non Af Amer 54 (L) >60 mL/min   GFR calc Af Amer >60 >60 mL/min    Comment: (NOTE) The eGFR has been calculated using the CKD EPI equation. This calculation has not been validated in all clinical situations. eGFR's persistently <60 mL/min signify possible Chronic Kidney Disease.    Anion gap 12 5 - 15  CBC     Status: Abnormal   Collection Time: 06/01/17  5:06 PM  Result Value Ref Range   WBC 10.7 (H) 4.0 - 10.5 K/uL   RBC 4.11 (L) 4.22 - 5.81 MIL/uL   Hemoglobin 12.5 (L) 13.0 - 17.0 g/dL   HCT 38.2 (L) 39.0 - 52.0 %   MCV 92.9 78.0 - 100.0 fL   MCH 30.4 26.0 - 34.0 pg   MCHC 32.7 30.0 - 36.0 g/dL   RDW 13.7 11.5 - 15.5 %   Platelets 196 150 - 400 K/uL  Troponin I     Status: Abnormal   Collection Time: 06/01/17  5:06 PM  Result Value Ref Range   Troponin I 0.07 (HH) <0.03 ng/mL    Comment: CRITICAL RESULT CALLED TO, READ BACK BY AND VERIFIED WITH: B MILLER,RN [id number] 06/01/2017 WBOND   Brain natriuretic peptide     Status: Abnormal   Collection Time: 06/01/17  6:32 PM  Result  Value Ref Range   B Natriuretic Peptide >4,500.0 (H) 0.0 - 100.0 pg/mL  Hepatic function panel     Status: Abnormal   Collection Time: 06/01/17  6:32 PM  Result Value Ref Range   Total Protein 6.3 (L) 6.5 - 8.1 g/dL   Albumin 3.3 (L) 3.5 - 5.0 g/dL   AST 30 15 - 41 U/L   ALT 21 17 - 63 U/L   Alkaline Phosphatase 127 (H) 38 - 126 U/L  Total Bilirubin 0.7 0.3 - 1.2 mg/dL   Bilirubin, Direct 0.1 0.1 - 0.5 mg/dL   Indirect Bilirubin 0.6 0.3 - 0.9 mg/dL  I-Stat CG4 Lactic Acid, ED     Status: None   Collection Time: 06/01/17  6:49 PM  Result Value Ref Range   Lactic Acid, Venous 1.04 0.5 - 1.9 mmol/L  I-Stat CG4 Lactic Acid, ED     Status: None   Collection Time: 06/01/17  9:03 PM  Result Value Ref Range   Lactic Acid, Venous 0.95 0.5 - 1.9 mmol/L   Dg Chest 2 View  Result Date: 06/01/2017 CLINICAL DATA:  Productive cough over the last 2 weeks. Shortness of breath. EXAM: CHEST  2 VIEW COMPARISON:  04/07/2017 FINDINGS: Power port remains in place with tip in the SVC 3 cm above the right atrium. Heart size is normal. There is aortic atherosclerosis. There are infiltrates in both lower lobes consistent with pneumonia. Small layering effusions on each side. Upper lungs are clear. IMPRESSION: Bilateral lower lobe pneumonia.  Small effusions. Electronically Signed   By: Nelson Chimes M.D.   On: 06/01/2017 17:46   EKG 06/01/2017: Sinus rhythm 99 bpm. LVH. Old anteroseptal infarct. PVC's  Review of Systems  Constitutional: Positive for malaise/fatigue. Negative for chills and fever.  HENT: Negative.   Eyes: Negative.   Respiratory: Positive for shortness of breath and wheezing. Negative for cough.   Cardiovascular: Positive for palpitations, orthopnea, leg swelling and PND. Negative for chest pain and claudication.       2+ DP/PT Rt, 2+DP/1+ PT left  Gastrointestinal: Negative for nausea and vomiting.  Genitourinary: Negative.   Musculoskeletal: Negative.   Skin: Negative for rash.   Neurological: Negative for dizziness, seizures and loss of consciousness.  Endo/Heme/Allergies: Does not bruise/bleed easily.  All other systems reviewed and are negative.   Blood pressure (!) 169/89, pulse (!) 106, temperature 97.8 F (36.6 C), resp. rate (!) 25, height 5' 8.5" (1.74 m), weight 66.7 kg (147 lb), SpO2 98 %. Physical Exam  Nursing note and vitals reviewed. Constitutional: He is oriented to person, place, and time. He appears well-developed and well-nourished.  Appears cachectic   HENT:  Head: Normocephalic and atraumatic.  Neck: JVD present.  Cardiovascular:  Tachycardic. II/VI systolic murmur apex  Respiratory: He has wheezes. He has rales. He exhibits no tenderness.  Increased work of breathing  GI: Soft. Bowel sounds are normal. There is no tenderness.  Musculoskeletal: Normal range of motion. He exhibits edema (2+ b/l).  Neurological: He is alert and oriented to person, place, and time.     Assessment: Acute on chronic systolic and diastolic heart failure Troponin elevation, secondary to heart failure decompensation Ischemic cardiomyopathy CAD s/p LM PCI 2015. CTO Distal LCx and RCA with left-to-left and left-to-right collaterals PAD s/p iliac/femoral stenting 2015  Hypertension H/o NHL H/o Renal cell carcinoma s/p nephrectomy H/o seizure disorder  My suspicion for HCAP is low given lack of fever, significant leukocytosis, and clinical syndrome suggestive of heart failure decompensation.  Plan: Admit to telemetry Nitro paste IV lasix 40 mg q 6hr Strict I/O Echocardiogram Electrolyte replacement, as needed Increase coreg to 6.25 bid.  Will start spironolactone now. Will start Entresto once acute decompensation over.  Will need to monitor his renal function closely given h/o nephrectomy  Nigel Mormon, MD 06/01/2017, 9:16 PM  Sheppard Luckenbach Esther Hardy, MD [facility]. [facility] Pager: [phone] Office: [phone] If no answer Cell  [phone]

## 2017-06-01 NOTE — ED Notes (Signed)
Pt given ginger ale per nurse's permission so that his blood cultures could be drawn with greater chance of success.

## 2017-06-01 NOTE — ED Provider Notes (Signed)
[facility] EMERGENCY DEPARTMENT Provider Note   CSN: [id number] Arrival date & time: 06/01/17  1642     History   Chief Complaint Chief Complaint  Patient presents with  . Shortness of Breath    HPI Gilbert [name] is a 66 y.o. male.  HPI Patient has been getting increasingly short of breath for at least 4 days.  He denies he has had any chest pain or fever.  He reports he has intermittent cough.  He does not note that it significantly different over these past several days.  Sometimes when he coughs fairly hard it does hurt in his chest.  He reports he has been getting very short of breath with lying down at night.  Now over the past day he is also getting very short of breath with any activity.  No hemoptysis.  No lower extremity pain.  He is getting slight swelling in the legs.  Patient did not know of history of congestive heart failure although problem list indicates ischemic cardiomyopathy and CHF resolved in 2015.Marland Kitchen  He has prior history of MI in 2015.  He does have a long smoking history but quit many years ago.  He did have a hospitalization [phone] for acute renal insufficiency.  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  These are both in remission with no active known cancer or current chemo or radiation therapy.  No history of PE or DVT. Past Medical History:  Diagnosis Date  . Abnormal liver enzymes    Chronic alkaline phosphatase elevation since 2014  . Acid reflux   . CAD (coronary artery disease)   . Cancer ([place]) 08/2013   right kidney cancer  . Cancer ([place])   . Coronary artery calcification seen on CAT scan   . Coronary atherosclerosis of native coronary artery   . 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 Essential hypertension, benign   . Family history of anesthesia complication    mother-had stroke  during anesthesia  . GERD (gastroesophageal reflux disease)   . Gout   . History of cardiac arrest 11/26/13   PTCA/Stenting of LM & Prox LAD with 4.0 x 18 mm Xience alpine stent. Used Impella Circulatory assist device. EF= 20-25%  . History of epilepsy    at least 10 years ago. 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 History of myocardial infarction less than 8 weeks    Stent placed  . History of nephrectomy 11/2013   For renal cell carcinoma  . History of tobacco use   . HTN (hypertension)   . Hypercholesteremia   . Hyperlipemia   . Hypertension   . PAD (peripheral artery disease) ([place])   . Seizures ([place])   . Shortness of breath    with activity  . SOB (shortness of breath)   . Systolic and diastolic CHF, chronic (HCC)    Resolved. EF 45% 04/10/2014  . Therapeutic drug monitoring     Patient Active Problem List   Diagnosis Date Noted  . AKI (acute kidney injury) ([place]) 04/07/2017  . Orthostatic hypotension 04/07/2017  . 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 Hematuria 10/29/2015  . Dysphagia, pharyngoesophageal phase   . Early satiety   . Esophageal stricture   . Gastritis and gastroduodenitis   . PAD (peripheral  artery disease) ([place]) 05/28/2014  . Cardiomyopathy, ischemic 04/03/2014  . History of left heart catheterization (LHC) 02/26/2014  . Arterial bleed, intraoperative 02/26/2014  . Seizures ([place]) 11/27/2013  . HTN (hypertension) 11/27/2013  . Hypomagnesemia 11/27/2013  . CAD (coronary artery disease), native coronary artery 11/26/2013  . Renal cancer ([place]) 10/19/2013  . Coronary artery calcification 09/25/2013  . Renal cyst 08/30/2013  . Loss of weight 08/03/2013    Past Surgical History:  Procedure Laterality Date  . BALLOON DILATION N/A 01/21/2015   Procedure: BALLOON DILATION;  Surgeon: Gatha Mayer, MD;  Location: [facility] ENDOSCOPY;   Service: Endoscopy;  Laterality: N/A;  . CARDIAC CATHETERIZATION N/A 06/10/2015   Procedure: Left Heart Cath and Coronary Angiography;  Surgeon: Adrian Prows, MD;  Location: [facility] CV LAB;  Service: Cardiovascular;  Laterality: N/A;  . CORONARY ANGIOPLASTY WITH STENT PLACEMENT  2015   Left main coronary artery stenting on emergent basis along with circulatory support with Impella device through left leg. With 4.0 x 18 mm Xience alpine stent  . ESOPHAGOGASTRODUODENOSCOPY    . ESOPHAGOGASTRODUODENOSCOPY N/A 01/21/2015   Procedure: ESOPHAGOGASTRODUODENOSCOPY (EGD) with possible dilation.;  Surgeon: Gatha Mayer, MD;  Location: [facility] ENDOSCOPY;  Service: Endoscopy;  Laterality: N/A;  . EYE SURGERY Left 2 weeks ago   torn retina  . ILIAC ARTERY STENT Left 05/28/2014   dr Einar Gip  . LEFT HEART CATHETERIZATION WITH CORONARY ANGIOGRAM N/A 11/27/2013   Procedure: LEFT HEART CATHETERIZATION WITH CORONARY ANGIOGRAM;  Surgeon: [name], MD;  Location: [facility] CATH LAB;  Service: Cardiovascular;  Laterality: N/A;  . LOWER EXTREMITY ANGIOGRAM N/A 02/26/2014   Procedure: LOWER EXTREMITY ANGIOGRAM;  Surgeon: [name], MD;  Location: [facility] CATH LAB;  Service: Cardiovascular;  Laterality: N/A;  . LOWER EXTREMITY ANGIOGRAM N/A 05/28/2014   Procedure: LOWER EXTREMITY ANGIOGRAM;  Surgeon: [name], MD;  Location: [facility] CATH LAB;  Service: Cardiovascular;  Laterality: N/A;  . NEPHRECTOMY  11/2013  . NO PAST SURGERIES    . PERCUTANEOUS CORONARY STENT INTERVENTION (PCI-S)  11/27/2013   Procedure: PERCUTANEOUS CORONARY STENT INTERVENTION (PCI-S);  Surgeon: [name], MD;  Location: [facility] CATH LAB;  Service: Cardiovascular;;  . ROBOT ASSISTED LAPAROSCOPIC NEPHRECTOMY Right 10/19/2013   Procedure: ROBOTIC ASSISTED LAPAROSCOPIC NEPHRECTOMY,  EXTENSIVE ADHESIOLYSIS;  Surgeon: Alexis Frock, MD;  Location: [facility] ORS;  Service: Urology;  Laterality: Right;       Home Medications    Prior to Admission  medications   Medication Sig Start Date End Date Taking? Authorizing Provider  aspirin EC 81 MG tablet Take 1 tablet (81 mg total) by mouth daily. 12/11/13  Yes Angiulli, Lavon Paganini, PA-C  atorvastatin (LIPITOR) 80 MG tablet Take 80 mg by mouth daily. 09/05/15  Yes [provider]  calcium carbonate (TUMS - DOSED IN MG ELEMENTAL CALCIUM) 500 MG chewable tablet Chew 1 tablet by mouth as needed for indigestion or heartburn.   Yes [provider]  carvedilol (COREG) 3.125 MG tablet Take 1 tablet (3.125 mg total) by mouth 2 (two) times daily with a meal. 04/08/17 06/01/17 Yes Elodia Florence., MD  cholecalciferol (VITAMIN D) 400 units TABS tablet Take 400 Units by mouth daily.   Yes [provider]  clopidogrel (PLAVIX) 75 MG tablet Take 75 mg by mouth daily.  10/31/15  Yes [provider]  Cyanocobalamin (B-12) 5000 MCG CAPS Take 5,000 mcg by mouth every morning.    Yes [provider]  lidocaine-prilocaine (EMLA) cream Apply 1 application  topically daily as needed (port access). Apply to affected area once 01/06/17  Yes Ennever, Rudell Cobb, MD  phenytoin (DILANTIN) 300 MG ER capsule Take 1 capsule (300 mg total) by mouth every morning. 01/27/17  Yes Wendie Agreste, MD  pyridOXINE (VITAMIN B-6) 100 MG tablet Take 100 mg by mouth every morning.    Yes [provider]  ranitidine (ZANTAC) 150 MG tablet Take 150 mg by mouth daily as needed for heartburn.    Yes [provider]  Thiamine HCl (VITAMIN B-1) 250 MG tablet Take 250 mg by mouth every morning.    Yes [provider]  acetaminophen (TYLENOL) 500 MG tablet Take 500 mg by mouth every 6 (six) hours as needed for mild pain or headache.     [provider]    Family History Family History  Problem Relation Age of Onset  . Stroke Mother   . Heart disease Brother        multiple stents  . Hyperlipidemia Brother   . Emphysema Father     Social History Social History    Substance Use Topics  . Smoking status: Former Smoker    Packs/day: 2.00    Years: 45.00    Types: Cigarettes    Quit date: 08/03/2003  . Smokeless tobacco: Current User    Types: Chew     Comment: quit  smoking 10 years agop  . Alcohol use No     Comment: quit 1980     Allergies   Oxycodone   Review of Systems Review of Systems 10 Systems reviewed and are negative for acute change except as noted in the HPI.   Physical Exam Updated Vital Signs BP (!) 169/89   Pulse (!) 106   Temp 97.8 F (36.6 C)   Resp (!) 25   Ht 5' 8.5" (1.74 m)   Wt 66.7 kg (147 lb)   SpO2 98%   BMI 22.03 kg/m   Physical Exam  Constitutional: He is oriented to person, place, and time.  Patient is alert and nontoxic.  He does have tachypnea at rest.  Status clear.  No distress.  Patient is somewhat thin and deconditioned.  HENT:  Head: Normocephalic and atraumatic.  Mouth/Throat: Oropharynx is clear and moist.  Eyes: EOM are normal.  Cardiovascular:  Borderline Tachycardia.  No gross rub murmur gallop  Pulmonary/Chest:  Tachypnea.  Significantly decreased breath sounds at bases.  Crackles from midlung fields to bases  Abdominal: Soft. He exhibits no distension. There is no tenderness. There is no guarding.  Musculoskeletal: Normal range of motion.  1+ pitting edema bilateral lower extremities.  No calf tenderness.  Neurological: He is alert and oriented to person, place, and time. No cranial nerve deficit. He exhibits normal muscle tone. Coordination normal.  Skin: Skin is warm and dry.  Psychiatric: He has a normal mood and affect.     ED Treatments / Results  Labs (all labs ordered are listed, but only abnormal results are displayed) Labs Reviewed  BASIC METABOLIC PANEL - Abnormal; Notable for the following:       Result Value   Creatinine, Ser 1.33 (*)    Calcium 8.4 (*)    GFR calc non Af Amer 54 (*)    All other components within normal limits  CBC - Abnormal; Notable for the  following:    WBC 10.7 (*)    RBC 4.11 (*)    Hemoglobin 12.5 (*)    HCT 38.2 (*)  All other components within normal limits  TROPONIN I - Abnormal; Notable for the following:    Troponin I 0.07 (*)    All other components within normal limits  BRAIN NATRIURETIC PEPTIDE - Abnormal; Notable for the following:    B Natriuretic Peptide >4,500.0 (*)    All other components within normal limits  HEPATIC FUNCTION PANEL - Abnormal; Notable for the following:    Total Protein 6.3 (*)    Albumin 3.3 (*)    Alkaline Phosphatase 127 (*)    All other components within normal limits  CULTURE, BLOOD (ROUTINE X 2)  CULTURE, BLOOD (ROUTINE X 2)  PHENYTOIN LEVEL, TOTAL  I-STAT CG4 LACTIC ACID, ED  I-STAT CG4 LACTIC ACID, ED    EKG  EKG Interpretation  Date/Time:  Wednesday June 01 2017 16:55:34 EDT Ventricular Rate:  99 PR Interval:  144 QRS Duration: 108 QT Interval:  372 QTC Calculation: 477 R Axis:   86 Text Interpretation:  Sinus rhythm with occasional Premature ventricular complexes Left ventricular hypertrophy with repolarization abnormality Cannot rule out Septal infarct , age undetermined Abnormal ECG Confirmed by Pattricia Boss [id number]) on 06/01/2017 5:00:44 PM       Radiology Dg Chest 2 View  Result Date: 06/01/2017 CLINICAL DATA:  Productive cough over the last 2 weeks. Shortness of breath. EXAM: CHEST  2 VIEW COMPARISON:  04/07/2017 FINDINGS: Power port remains in place with tip in the SVC 3 cm above the right atrium. Heart size is normal. There is aortic atherosclerosis. There are infiltrates in both lower lobes consistent with pneumonia. Small layering effusions on each side. Upper lungs are clear. IMPRESSION: Bilateral lower lobe pneumonia.  Small effusions. Electronically Signed   By: Nelson Chimes M.D.   On: 06/01/2017 17:46    Procedures Procedures (including critical care time) CRITICAL CARE Performed by: Charlesetta Shanks   Total critical care time: 30  minutes  Critical care time was exclusive of separately billable procedures and treating other patients.  Critical care was necessary to treat or prevent imminent or life-threatening deterioration.  Critical care was time spent personally by me on the following activities: development of treatment plan with patient and/or surrogate as well as nursing, discussions with consultants, evaluation of patient's response to treatment, examination of patient, obtaining history from patient or surrogate, ordering and performing treatments and interventions, ordering and review of laboratory studies, ordering and review of radiographic studies, pulse oximetry and re-evaluation of patient's condition. Medications Ordered in ED Medications  ceFEPIme (MAXIPIME) 2 g in dextrose 5 % 50 mL IVPB (not administered)  ipratropium-albuterol (DUONEB) 0.5-2.5 (3) MG/3ML nebulizer solution 3 mL (3 mLs Nebulization Given 06/01/17 1817)  vancomycin (VANCOCIN) IVPB 1000 mg/200 mL premix (1,000 mg Intravenous New Bag/Given 06/01/17 1917)  furosemide (LASIX) injection 40 mg (40 mg Intravenous Given 06/01/17 2022)  aspirin chewable tablet 324 mg (324 mg Oral Given 06/01/17 2022)     Initial Impression / Assessment and Plan / ED Course  I have reviewed the triage vital signs and the nursing notes.  Pertinent labs & imaging results that were available during my care of the patient were reviewed by me and considered in my medical decision making (see chart for details).    Consult: Reviewed with Dr. Posey Pronto (Dr. Vivianne Spence covering partner).  Will admit to telemetry for ongoing management.  Final Clinical Impressions(s) / ED Diagnoses   Final diagnoses:  HCAP (healthcare-associated pneumonia)  Acute congestive heart failure, unspecified heart failure type [facility])  Patient presents as outlined  above with increasing dyspnea over at least 4 days and much worse for 2 days.  Diagnostic studies indicate patient has congestive heart  failure with significant elevated BNP.  He denies any chest pain.  Chest x-ray shows consolidation in the bases suggestive of pneumonia per radiology.  Patient has had some cough but not documented fever.  He was hospitalized within the past 60 days.. Empiric HCAP Antibiotics initiated for return of BNP.  With BNP significantly elevated and new onset of lower extremity pitting edema albeit mild, I am more suspect acute CHF is the primary problem however pneumonia also is a consideration. New Prescriptions New Prescriptions   No medications on file     Charlesetta Shanks, MD 06/01/17 2114

## 2017-06-01 NOTE — ED Triage Notes (Signed)
The pt is c/o sob for 2 days  He was sent here from [facility].  No pain

## 2017-06-02 ENCOUNTER — Encounter (HOSPITAL_COMMUNITY): Payer: Self-pay | Admitting: *Deleted

## 2017-06-02 ENCOUNTER — Inpatient Hospital Stay (HOSPITAL_COMMUNITY): Payer: Medicare Other

## 2017-06-02 LAB — ECHOCARDIOGRAM COMPLETE
AOPV: 0.65 m/s
AV Area VTI index: 1.21 cm2/m2
AV Area mean vel: 1.99 cm2
AV VEL mean LVOT/AV: 0.63
AVA: 2.17 cm2
AVAREAMEANVIN: 1.11 cm2/m2
AVAREAVTI: 2.05 cm2
AVG: 2 mmHg
AVLVOTPG: 2 mmHg
AVPG: 4 mmHg
AVPKVEL: 97 cm/s
Area-P 1/2: 2.18 cm2
CHL CUP AV PEAK INDEX: 1.14
CHL CUP AV VALUE AREA INDEX: 1.21
CHL CUP AV VEL: 2.17
CHL CUP STROKE VOLUME: 51 mL
DOP CAL AO MEAN VELOCITY: 64.8 cm/s
EERAT: 14.55
EWDT: 166 ms
FS: 11 % — AB (ref 28–44)
Height: 70 in
IVS/LV PW RATIO, ED: 1
LA diam end sys: 48 mm
LA vol A4C: 74 ml
LADIAMINDEX: 2.67 cm/m2
LASIZE: 48 mm
LAVOL: 79.5 mL
LAVOLIN: 44.2 mL/m2
LV E/e'average: 14.55
LV e' LATERAL: 7.15 cm/s
LV sys vol index: 76 mL/m2
LV sys vol: 137 mL — AB (ref 21–61)
LVDIAVOL: 188 mL — AB (ref 62–150)
LVDIAVOLIN: 104 mL/m2
LVEEMED: 14.55
LVOT SV: 34 mL
LVOT VTI: 10.7 cm
LVOT area: 3.14 cm2
LVOT diameter: 20 mm
LVOTPV: 63.3 cm/s
LVOTVTI: 0.69 cm
MV Dec: 166
MV VTI: 179 cm
MVPG: 4 mmHg
MVPKAVEL: 47 m/s
MVPKEVEL: 104 m/s
P 1/2 time: 49 ms
PISA EROA: 0.1 cm2
PW: 12 mm — AB (ref 0.6–1.1)
RV LATERAL S' VELOCITY: 9.77 cm/s
RV TAPSE: 19.4 mm
Simpson's disk: 27
TDI e' lateral: 7.15
TDI e' medial: 3.55
VTI: 15.5 cm
Weight: 2251.2 oz

## 2017-06-02 LAB — BASIC METABOLIC PANEL
Anion gap: 8 (ref 5–15)
BUN: 14 mg/dL (ref 6–20)
CHLORIDE: 104 mmol/L (ref 101–111)
CO2: 27 mmol/L (ref 22–32)
CREATININE: 1.28 mg/dL — AB (ref 0.61–1.24)
Calcium: 7.7 mg/dL — ABNORMAL LOW (ref 8.9–10.3)
GFR calc Af Amer: >60 mL/min (ref >60)
GFR calc non Af Amer: 57 mL/min — ABNORMAL LOW (ref >60)
Glucose, Bld: 91 mg/dL (ref 65–99)
Potassium: 3.5 mmol/L (ref 3.5–5.1)
Sodium: 139 mmol/L (ref 135–145)

## 2017-06-02 LAB — CBC WITH DIFFERENTIAL/PLATELET
BASOS ABS: 0 10*3/uL (ref 0.0–0.1)
Basophils Relative: 1 %
EOS PCT: 9 %
Eosinophils Absolute: 0.7 10*3/uL (ref 0.0–0.7)
HEMATOCRIT: 32.6 % — AB (ref 39.0–52.0)
Hemoglobin: 10.8 g/dL — ABNORMAL LOW (ref 13.0–17.0)
LYMPHS ABS: 0.7 10*3/uL (ref 0.7–4.0)
Lymphocytes Relative: 10 %
MCH: 30.6 pg (ref 26.0–34.0)
MCHC: 33.1 g/dL (ref 30.0–36.0)
MCV: 92.4 fL (ref 78.0–100.0)
MONO ABS: 0.8 10*3/uL (ref 0.1–1.0)
MONOS PCT: 10 %
Neutro Abs: 5.2 10*3/uL (ref 1.7–7.7)
Neutrophils Relative %: 70 %
Platelets: 127 10*3/uL — ABNORMAL LOW (ref 150–400)
RBC: 3.53 MIL/uL — ABNORMAL LOW (ref 4.22–5.81)
RDW: 13.6 % (ref 11.5–15.5)
WBC: 7.3 10*3/uL (ref 4.0–10.5)

## 2017-06-02 LAB — MAGNESIUM: Magnesium: 1.6 mg/dL — ABNORMAL LOW (ref 1.7–2.4)

## 2017-06-02 MED ORDER — FUROSEMIDE 10 MG/ML IJ SOLN
40.0000 mg | Freq: Once | INTRAMUSCULAR | Status: AC
  Administered 2017-06-02: 40 mg via INTRAVENOUS
  Filled 2017-06-02: qty 4

## 2017-06-02 MED ORDER — FUROSEMIDE 40 MG PO TABS
40.0000 mg | ORAL_TABLET | Freq: Two times a day (BID) | ORAL | Status: DC
Start: 2017-06-02 — End: 2017-06-03
  Administered 2017-06-02 – 2017-06-03 (×3): 40 mg via ORAL
  Filled 2017-06-02 (×3): qty 1

## 2017-06-02 MED ORDER — POTASSIUM CHLORIDE ER 10 MEQ PO TBCR
40.0000 meq | EXTENDED_RELEASE_TABLET | Freq: Once | ORAL | Status: AC
  Administered 2017-06-02: 40 meq via ORAL
  Filled 2017-06-02: qty 4

## 2017-06-02 MED ORDER — MAGNESIUM SULFATE 4 GM/100ML IV SOLN
4.0000 g | Freq: Once | INTRAVENOUS | Status: AC
  Administered 2017-06-02: 4 g via INTRAVENOUS
  Filled 2017-06-02: qty 100

## 2017-06-02 NOTE — Progress Notes (Signed)
Verified with MD to discontinue IV lasix order. Order carried out, will continue to monitor.

## 2017-06-02 NOTE — Progress Notes (Signed)
  Echocardiogram 2D Echocardiogram has been performed.  Gilbert [name] 06/02/2017, 1:33 PM

## 2017-06-02 NOTE — Progress Notes (Signed)
Subjective:  Feels better. Breathing improved  Objective:  Vital Signs in the last 24 hours: Temp:  [97.8 F (36.6 C)-98.7 F (37.1 C)] 98.1 F (36.7 C) (11/01 0431) Pulse Rate:  [87-106] 100 (11/01 0431) Resp:  [19-30] 20 (11/01 0431) BP: (124-188)/(72-101) 156/92 (11/01 0431) SpO2:  [97 %-100 %] 99 % (11/01 0431) Weight:  [63.8 kg (140 lb 11.2 oz)-67 kg (147 lb 12.8 oz)] 63.8 kg (140 lb 11.2 oz) (11/01 0431)  Intake/Output from previous day: 10/31 0701 - 11/01 0700 In: 590 [P.O.:240; IV Piggyback:350] Out: 1225 [Urine:1225] Intake/Output from this shift: Total I/O In: -  Out: 500 [Urine:500]  Physical Exam: Nursing note and vitals reviewed. Constitutional: He is oriented to person, place, and time. He appears well-developed and well-nourished.  Appears cachectic   HENT:  Head: Normocephalic and atraumatic.  Neck: JVD improved. Cardiovascular:  Regular rhythm. II/VI systolic murmur apex  Respiratory: He has no wheezes. He has no rales. He exhibits no tenderness.  No increased work of breathing GI: Soft. Bowel sounds are normal. There is no tenderness.  Musculoskeletal: Normal range of motion. He exhibits edema (1+ b/l).  Neurological: He is alert and oriented to person, place, and time.   Lab Results:  Recent Labs  06/01/17 1706 06/02/17 0414  WBC 10.7* 7.3  HGB 12.5* 10.8*  PLT 196 127*    Recent Labs  06/01/17 1706 06/02/17 0414  NA 139 139  K 3.8 3.5  CL 103 104  CO2 24 27  GLUCOSE 98 91  BUN 14 14  CREATININE 1.33* 1.28*    Recent Labs  06/01/17 1706  TROPONINI 0.07*   Hepatic Function Panel  Recent Labs  06/01/17 1832  PROT 6.3*  ALBUMIN 3.3*  AST 30  ALT 21  ALKPHOS 127*  BILITOT 0.7  BILIDIR 0.1  IBILI 0.6   Imaging: CLINICAL DATA:  Productive cough over the last 2 weeks. Shortness of breath.  EXAM: CHEST  2 VIEW  COMPARISON:  04/07/2017  FINDINGS: Power port remains in place with tip in the SVC 3 cm above the  right atrium. Heart size is normal. There is aortic atherosclerosis. There are infiltrates in both lower lobes consistent with pneumonia. Small layering effusions on each side. Upper lungs are clear.  IMPRESSION: Bilateral lower lobe pneumonia.  Small effusions.  Clinically, this is more consistent with heart failure.  Cardiac Studies:  Assessment: Acute on chronic systolic and diastolic heart failure Troponin elevation, secondary to heart failure decompensation Ischemic cardiomyopathy CAD s/p LM PCI 2015. CTO Distal LCx and RCA with left-to-left and left-to-right collaterals PAD s/p iliac/femoral stenting 2015  Hypertension H/o NHL H/o Renal cell carcinoma s/p nephrectomy H/o seizure disorder  My suspicion for HCAP is low given lack of fever, significant leukocytosis, and clinical syndrome suggestive of heart failure decompensation.  Plan: Switch lasix to PO later today. 40 mg PO bid after one dose of IV lasix 40 mg this morning. Strict I/O Echocardiogram pending Continue coreg to 6.25 bid and spironolactone 25 mg daily.  Will start Entresto once acute decompensation over.  Will need to monitor his renal function closely given h/o nephrectomy    LOS: 1 day    Gilbert [name] J Gilbert [name] 06/02/2017, 9:32 AM  Nigel Mormon, MD [facility] Pager: [phone] Office: [phone] If no answer Cell [phone]

## 2017-06-02 NOTE — Care Management Note (Signed)
Case Management Note  Patient Details  Name: Denard Brobeck MRN: [id number] Date of Birth: [birth year]  Subjective/Objective:   CHF                Action/Plan: Patient lives alone; Patient goes to Primary Care at [facility]; has private insurance with [facility] with prescription drug coverage; CM following for DCP  Expected Discharge Date:   possibly 06/06/2017               Expected Discharge Plan:  Home/Self Care  In-House Referral:   [facility]  Discharge planning Services  CM Consult  Status of Service:  In process, will continue to follow  Sherrilyn Rist [phone] 06/02/2017, 2:14 PM

## 2017-06-03 LAB — CBC WITH DIFFERENTIAL/PLATELET
BASOS ABS: 0.1 10*3/uL (ref 0.0–0.1)
BASOS PCT: 1 %
Eosinophils Absolute: 0.6 10*3/uL (ref 0.0–0.7)
Eosinophils Relative: 11 %
HEMATOCRIT: 33.5 % — AB (ref 39.0–52.0)
Hemoglobin: 11 g/dL — ABNORMAL LOW (ref 13.0–17.0)
Lymphocytes Relative: 13 %
Lymphs Abs: 0.7 10*3/uL (ref 0.7–4.0)
MCH: 30.1 pg (ref 26.0–34.0)
MCHC: 32.8 g/dL (ref 30.0–36.0)
MCV: 91.8 fL (ref 78.0–100.0)
MONO ABS: 0.6 10*3/uL (ref 0.1–1.0)
Monocytes Relative: 12 %
NEUTROS ABS: 3.6 10*3/uL (ref 1.7–7.7)
Neutrophils Relative %: 63 %
PLATELETS: 131 10*3/uL — AB (ref 150–400)
RBC: 3.65 MIL/uL — ABNORMAL LOW (ref 4.22–5.81)
RDW: 13.8 % (ref 11.5–15.5)
WBC: 5.5 10*3/uL (ref 4.0–10.5)

## 2017-06-03 LAB — BASIC METABOLIC PANEL
ANION GAP: 9 (ref 5–15)
BUN: 18 mg/dL (ref 6–20)
CALCIUM: 7.7 mg/dL — AB (ref 8.9–10.3)
CO2: 26 mmol/L (ref 22–32)
Chloride: 102 mmol/L (ref 101–111)
Creatinine, Ser: 1.35 mg/dL — ABNORMAL HIGH (ref 0.61–1.24)
GFR, EST NON AFRICAN AMERICAN: 53 mL/min — AB (ref >60)
GLUCOSE: 84 mg/dL (ref 65–99)
Potassium: 3.5 mmol/L (ref 3.5–5.1)
Sodium: 137 mmol/L (ref 135–145)

## 2017-06-03 MED ORDER — POTASSIUM CHLORIDE ER 20 MEQ PO TBCR: 20.0000 meq | EXTENDED_RELEASE_TABLET | Freq: Every day | ORAL | 3 refills | Status: DC

## 2017-06-03 MED ORDER — CARVEDILOL 12.5 MG PO TABS
12.5000 mg | ORAL_TABLET | Freq: Two times a day (BID) | ORAL | Status: DC
Start: 2017-06-03 — End: 2017-06-03

## 2017-06-03 MED ORDER — POTASSIUM CHLORIDE CRYS ER 10 MEQ PO TBCR
40.0000 meq | EXTENDED_RELEASE_TABLET | Freq: Every day | ORAL | Status: DC
  Administered 2017-06-03: 40 meq via ORAL
  Filled 2017-06-03 (×2): qty 4

## 2017-06-03 MED ORDER — FUROSEMIDE 40 MG PO TABS: 40.0000 mg | ORAL_TABLET | Freq: Two times a day (BID) | ORAL | 6 refills | Status: DC

## 2017-06-03 MED ORDER — HEPARIN SOD (PORK) LOCK FLUSH 100 UNIT/ML IV SOLN
500.0000 [IU] | INTRAVENOUS | Status: AC | PRN
Start: 2017-06-03 — End: 2017-06-03
  Administered 2017-06-03: 500 [IU]

## 2017-06-03 MED ORDER — SPIRONOLACTONE 25 MG PO TABS: 25.0000 mg | ORAL_TABLET | Freq: Every day | ORAL | 6 refills | Status: DC

## 2017-06-03 MED ORDER — CARVEDILOL 12.5 MG PO TABS: 12.5000 mg | ORAL_TABLET | Freq: Two times a day (BID) | ORAL | 3 refills | Status: DC

## 2017-06-03 MED ORDER — FUROSEMIDE 10 MG/ML IJ SOLN
40.0000 mg | Freq: Once | INTRAMUSCULAR | Status: AC
  Administered 2017-06-03: 40 mg via INTRAVENOUS
  Filled 2017-06-03: qty 4

## 2017-06-03 NOTE — Progress Notes (Signed)
Bath offered and pt stated that he will do it when he gets home

## 2017-06-03 NOTE — Discharge Summary (Addendum)
Physician Discharge Summary  Patient ID: Gilbert [name] MRN: [id number] DOB/AGE: Jun 22, 1951 66 y.o.  Admit date: 06/01/2017 Discharge date: 06/03/2017  Admission Diagnoses:  Discharge Diagnoses:  Acute on chronic systolic and diastolic heart failure, now resolved Troponin elevation, secondary to heart failure decompensation Ischemic cardiomyopathy CAD s/p LM PCI 2015. CTO Distal LCx and RCA with left-to-left and left-to-right collaterals PAD s/p iliac/femoral stenting 2015  Hypertension H/o NHL H/o Renal cell carcinoma s/p nephrectomy H/o seizure disorder  Discharged Condition: good  Hospital Course:   66 y/o ,male w/CAD s/p MI, LM stenting 2015, ischemic cardiomyopathy EF 30%, PAD s/p Left external iliac/common femoral stenting 2015, h/o nephrectomy for renal cell carcinoma, h/o NHL, currently in remission. For last four days, he has had worsening shortness of breath and leg edema. He denies any fever, chills, nausea, vomiting, or productive cough. He does not have any chest pain, presyncope, syncope.  He has also had orthopnea and PND.  He is only been on carvedilol 3.125 mg as he had hospital admission in September 2018 with orthostasis and lightheadedness after which his Benicar and amlodipine were stopped.   BNP in the ED was >4500. He was given one dose of cefepime suspecting pneumonia on chest Xray. No impressive leukocytosis. Blood cultures negative. He did not receive further doses of antibiotics.  During the hospital admission, patient was diuresed with IV and by mouth Lasix. He was 2.7 L net negative on the day of discharge. He ambulated without any dyspnea or desaturation. His carvedilol was eventually increased to 12.5 mg twice a day. He was also started on spironolactone 25 mg once a day. His blood pressure was improved on discharge. He will be taking Lasix 40 mg twice a day along with 20 mEq of potassium supplement. He'll continue aspirin and Plavix's. We will see him on  Monday 06/06/2017 in the clinic for transition of care follow up. Given decompensation of his heart failure and decrease in his EF, we will need to obtain a nuclear stress test to evaluate for ischemia and infarct. We will reevaluate after the stress test, whether he needs repeat coronary angiography and/or revascularization. We will start him on an Entresto in the clinic at 24-26 milligrams twice daily dose. Will check BMP on 06/06/2017. Eventually, will need to have discussion regarding ICD placement for primary SCD prevention.  He was educated on low sodium diet and medication compliance.   Consults: None  Significant Diagnostic Studies:  Echocardiogram 06/02/2017: Study Conclusions  - Left ventricle: The cavity size was moderately dilated. The   estimated ejection fraction was in the range of 15% to 20%.   Significant decrease in LV systolic function compared to prior   hospital echocardiograms. Severe diffuse hypokinesis. Akinesis of   the anterior myocardium. Doppler parameters are consistent with   both elevated ventricular end-diastolic filling pressure and   elevated left atrial filling pressure. Grade III diastolic   dysfunction. - Mitral valve: There was mild to moderate regurgitation. Valve   area by pressure half-time: 2.18 cm^2. - Left atrium: The atrium was moderately dilated. - Right ventricle: Systolic function was low normal. - Pericardium, extracardiac: Small right sided pericardial effusion   was identified. There was a large left pleural effusion.  Labs:  Results for RODRIQUEZ, THORNER (MRN [id number]) as of 06/03/2017 16:10  Ref. Range 06/02/2017 04:14 06/03/2017 44:31  BASIC METABOLIC PANEL Unknown Rpt (A) Rpt (A)  Sodium Latest Ref Range: 135 - 145 mmol/L 139 137  Potassium Latest Ref Range: 3.5 -  5.1 mmol/L 3.5 3.5  Chloride Latest Ref Range: 101 - 111 mmol/L 104 102  CO2 Latest Ref Range: 22 - 32 mmol/L 27 26  Glucose Latest Ref Range: 65 - 99 mg/dL 91 84  BUN Latest  Ref Range: 6 - 20 mg/dL 14 18  Creatinine Latest Ref Range: 0.61 - 1.24 mg/dL 1.28 (H) 1.35 (H)  Calcium Latest Ref Range: 8.9 - 10.3 mg/dL 7.7 (L) 7.7 (L)  Anion gap Latest Ref Range: 5 - 15  8 9   Magnesium Latest Ref Range: 1.7 - 2.4 mg/dL 1.6 (L)   GFR, Est Non African American Latest Ref Range: >60 mL/min 57 (L) 53 (L)  GFR, Est African American Latest Ref Range: >60 mL/min >60 >60   Results for JOHNSON, ARIZOLA (MRN [id number]) as of 06/03/2017 16:10  Ref. Range 06/02/2017 04:14 06/03/2017 04:21  WBC Latest Ref Range: 4.0 - 10.5 K/uL 7.3 5.5  RBC Latest Ref Range: 4.22 - 5.81 MIL/uL 3.53 (L) 3.65 (L)  Hemoglobin Latest Ref Range: 13.0 - 17.0 g/dL 10.8 (L) 11.0 (L)  HCT Latest Ref Range: 39.0 - 52.0 % 32.6 (L) 33.5 (L)  MCV Latest Ref Range: 78.0 - 100.0 fL 92.4 91.8  MCH Latest Ref Range: 26.0 - 34.0 pg 30.6 30.1  MCHC Latest Ref Range: 30.0 - 36.0 g/dL 33.1 32.8  RDW Latest Ref Range: 11.5 - 15.5 % 13.6 13.8  Platelets Latest Ref Range: 150 - 400 K/uL 127 (L) 131 (L)  Neutrophils Latest Units: % 70 63  Lymphocytes Latest Units: % 10 13  Monocytes Relative Latest Units: % 10 12  Eosinophil Latest Units: % 9 11  Basophil Latest Units: % 1 1  NEUT# Latest Ref Range: 1.7 - 7.7 K/uL 5.2 3.6  Lymphocyte # Latest Ref Range: 0.7 - 4.0 K/uL 0.7 0.7  Monocyte # Latest Ref Range: 0.1 - 1.0 K/uL 0.8 0.6  Eosinophils Absolute Latest Ref Range: 0.0 - 0.7 K/uL 0.7 0.6  Basophils Absolute Latest Ref Range: 0.0 - 0.1 K/uL 0.0 0.1   Treatments: IV Diuresis and cardiac meds as above  Discharge Exam: Blood pressure (!) 141/51, pulse 85, temperature 98.5 F (36.9 C), temperature source Oral, resp. rate 18, height 5\' 10"  (1.778 m), weight 61.7 kg (136 lb), SpO2 93 %. Nursing noteand vitalsreviewed. Constitutional: He is oriented to person, place, and time. He appears well-developedand well-nourished.  Appears cachectic  HENT:  Head: Normocephalicand atraumatic.  Neck: No JVD Cardiovascular:   Regular rhythm. I/VI systolic murmur apex Respiratory: He has no wheezes. He has no rales. He exhibits no tenderness.  No increased work of breathing GI: Soft. Bowel sounds are normal. There is no tenderness.  Musculoskeletal: Normal range of motion. He exhibits no edema.  Neurological: He is alertand oriented to person, place, and time.   Disposition: 01-Home or Self Care  Discharge Instructions    Diet - low sodium heart healthy    Complete by:  As directed    Increase activity slowly    Complete by:  As directed      Allergies as of 06/03/2017      Reactions   Oxycodone Swelling, Other (See Comments)   Tongue and lips swell      Medication List    TAKE these medications   acetaminophen 500 MG tablet Commonly known as:  TYLENOL Take 500 mg by mouth every 6 (six) hours as needed for mild pain or headache.   aspirin EC 81 MG tablet Take 1 tablet (81 mg total)  by mouth daily.   atorvastatin 80 MG tablet Commonly known as:  LIPITOR Take 80 mg by mouth daily.   B-12 5000 MCG Caps Take 5,000 mcg by mouth every morning.   calcium carbonate 500 MG chewable tablet Commonly known as:  TUMS - dosed in mg elemental calcium Chew 1 tablet by mouth as needed for indigestion or heartburn.   carvedilol 3.125 MG tablet Commonly known as:  COREG Take 1 tablet (3.125 mg total) by mouth 2 (two) times daily with a meal. What changed:  Another medication with the same name was added. Make sure you understand how and when to take each.   carvedilol 12.5 MG tablet Commonly known as:  COREG Take 1 tablet (12.5 mg total) by mouth 2 (two) times daily with a meal. What changed:  You were already taking a medication with the same name, and this prescription was added. Make sure you understand how and when to take each.   cholecalciferol 400 units Tabs tablet Commonly known as:  VITAMIN D Take 400 Units by mouth daily.   clopidogrel 75 MG tablet Commonly known as:  PLAVIX Take 75 mg  by mouth daily.   furosemide 40 MG tablet Commonly known as:  LASIX Take 1 tablet (40 mg total) by mouth 2 (two) times daily.   lidocaine-prilocaine cream Commonly known as:  EMLA Apply 1 application topically daily as needed (port access). Apply to affected area once   phenytoin 300 MG ER capsule Commonly known as:  DILANTIN Take 1 capsule (300 mg total) by mouth every morning.   Potassium Chloride ER 20 MEQ Tbcr Take 20 mEq by mouth daily.   pyridOXINE 100 MG tablet Commonly known as:  VITAMIN B-6 Take 100 mg by mouth every morning.   ranitidine 150 MG tablet Commonly known as:  ZANTAC Take 150 mg by mouth daily as needed for heartburn.   spironolactone 25 MG tablet Commonly known as:  ALDACTONE Take 1 tablet (25 mg total) by mouth daily.   vitamin B-1 250 MG tablet Take 250 mg by mouth every morning.      Follow-up Information    Adrian Prows, MD Follow up on 06/06/2017.   Specialty:  Cardiology Why:  3:00 PM Contact information: [address] [place] [address] [phone]           Signed: Nigel Mormon 06/03/2017, 10:58 AM   Nigel Mormon, MD [facility] Pager: [phone] Office: [phone] If no answer Cell [phone]

## 2017-06-06 ENCOUNTER — Telehealth: Payer: Self-pay

## 2017-06-06 LAB — CULTURE, BLOOD (ROUTINE X 2): CULTURE: NO GROWTH

## 2017-06-07 LAB — CULTURE, BLOOD (ROUTINE X 2): Culture: NO GROWTH

## 2017-06-07 NOTE — Telephone Encounter (Signed)
Attempted to contact patient X's three for TOC visit.

## 2017-06-10 ENCOUNTER — Telehealth: Payer: Self-pay

## 2017-06-10 ENCOUNTER — Encounter: Payer: Self-pay | Admitting: Family Medicine

## 2017-06-13 ENCOUNTER — Other Ambulatory Visit: Payer: Self-pay

## 2017-06-13 ENCOUNTER — Ambulatory Visit (INDEPENDENT_AMBULATORY_CARE_PROVIDER_SITE_OTHER): Payer: Medicare Other | Admitting: Family Medicine

## 2017-06-13 ENCOUNTER — Encounter: Payer: Self-pay | Admitting: Family Medicine

## 2017-06-13 ENCOUNTER — Ambulatory Visit: Payer: Medicare Other | Admitting: Physician Assistant

## 2017-06-13 VITALS — BP 98/76 | HR 76 | Temp 97.7°F | Resp 18 | Ht 70.0 in | Wt 134.8 lb

## 2017-06-13 DIAGNOSIS — I509 Heart failure, unspecified: Secondary | ICD-10-CM | POA: Diagnosis not present

## 2017-06-13 DIAGNOSIS — R42 Dizziness and giddiness: Secondary | ICD-10-CM

## 2017-06-13 DIAGNOSIS — R079 Chest pain, unspecified: Secondary | ICD-10-CM | POA: Diagnosis not present

## 2017-06-13 NOTE — Progress Notes (Addendum)
Subjective:  By signing my name below, I, Moises Blood, attest that this documentation has been prepared under the direction and in the presence of [name], MD. Electronically Signed: Moises Blood, [place]. 06/13/2017 , 3:54 PM .  Patient was seen in Room 14 .   Patient ID: Gilbert [name], male    DOB: 08-13-1950, 66 y.o.   MRN: [id number] Chief Complaint  Patient presents with  . Hospitalization Follow-up    Pt states he is feeling better since being in the hospital.   HPI Gilbert [name] is a 66 y.o. male Here for hospital follow up. Patient was admitted on Oct 31st through Nov 2nd with acute on chronic CHF. His initial BNP in ED was >4500. He was diuresed with IV and PO Lasix. He was 2.7 L net negative on the day of discharge. His Coreg was changed to 12.5mg  BID, started on spironolactone 25mg  QD; planned for Lasix outpatient 40mg  BID and 60mEq of potassium. Plan for outpatient nuclear stress test. Plan to start Entresto at cardiology and potential ICD placement for SCD prevention. His weight at discharge was 140 lbs (63.8 kg) on Nov 1st and he is 134 lbs today.  Did note weight of 134 at cardiology visit one week ago.  Patient states he's doing well on the past few days since hospital visit. He had stress test done a week ago, but hasn't received results yet. He will return to Dr. Einar Gip to discuss results on Nov 21st. He has some lightheadedness and dizziness when he stands up, as well as this morning when waking up. His BP has been around 110/60-64. He denies fever. He's been able to eat at home. He had lab work blood drawn at stress test.   Patient also reports having chest pain, almost daily. He informed Dr. Einar Gip, and states it was muscular. He denies any new chest pains.   Later added that he was 134 pounds at cardiology 1 week ago, weight 136 on hospital note form 06/03/17. Also noted he was started on Entresto after leaving hospital at cardiology follow up. Had stress test last  Wednesday, blood work at cardiology last Thursday.   Patient Active Problem List   Diagnosis Date Noted  . CHF (congestive heart failure) ([place]) 06/01/2017  . AKI (acute kidney injury) ([place]) 04/07/2017  . Orthostatic hypotension 04/07/2017  . Diffuse non-Hodgkin's lymphoma of bone ([place]) 01/08/2016  . 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 Hematuria 10/29/2015  . Dysphagia, pharyngoesophageal phase   . Early satiety   . Esophageal stricture   . Gastritis and gastroduodenitis   . PAD (peripheral artery disease) ([place]) 05/28/2014  . Cardiomyopathy, ischemic 04/03/2014  . History of left heart catheterization (L[facility]) 02/26/2014  . Arterial bleed, intraoperative 02/26/2014  . Seizures ([place]) 11/27/2013  . HTN (hypertension) 11/27/2013  . Hypomagnesemia 11/27/2013  . CAD (coronary artery disease), native coronary artery 11/26/2013  . Renal cancer ([place]) 10/19/2013  . Coronary artery calcification 09/25/2013  . Renal cyst 08/30/2013  . Loss of weight 08/03/2013   Past Medical History:  Diagnosis Date  . Abnormal liver enzymes    Chronic alkaline phosphatase elevation since 2014  . Acid reflux   . CAD (coronary artery disease)   . Cancer ([place]) 08/2013   right kidney cancer  . Cancer ([place])   . Coronary artery calcification seen on CAT scan   . Coronary atherosclerosis of native coronary artery   . Diffuse large cell non-Hodgkin's  lymphoma ([place]) 01/07/2016  . Diffuse non-Hodgkin's lymphoma of bone ([place]) 01/08/2016  . Essential hypertension, benign   . Family history of anesthesia complication    mother-had stroke during anesthesia  . GERD (gastroesophageal reflux disease)   . Gout   . History of cardiac arrest 11/26/13   PTCA/Stenting of LM & Prox LAD with 4.0 x 18 mm Xience alpine stent. Used Impella Circulatory assist device. EF= 20-25%  . History of epilepsy    at least 10 years ago. 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  History of myocardial infarction less than 8 weeks    Stent placed  . History of nephrectomy 11/2013   For renal cell carcinoma  . History of tobacco use   . HTN (hypertension)   . Hypercholesteremia   . Hyperlipemia   . Hypertension   . PAD (peripheral artery disease) ([place])   . Seizures ([place])   . Shortness of breath    with activity  . SOB (shortness of breath)   . Systolic and diastolic CHF, chronic (HCC)    Resolved. EF 45% 04/10/2014  . Therapeutic drug monitoring    Past Surgical History:  Procedure Laterality Date  . CORONARY ANGIOPLASTY WITH STENT PLACEMENT  2015   Left main coronary artery stenting on emergent basis along with circulatory support with Impella device through left leg. With 4.0 x 18 mm Xience alpine stent  . ESOPHAGOGASTRODUODENOSCOPY    . EYE SURGERY Left 2 weeks ago   torn retina  . ILIAC ARTERY STENT Left 05/28/2014   dr Einar Gip  . NEPHRECTOMY  11/2013  . NO PAST SURGERIES     Allergies  Allergen Reactions  . Oxycodone Swelling and Other (See Comments)    Tongue and lips swell   Prior to Admission medications   Medication Sig Start Date End Date Taking? Authorizing Provider  acetaminophen (TYLENOL) 500 MG tablet Take 500 mg by mouth every 6 (six) hours as needed for mild pain or headache.     [provider]  aspirin EC 81 MG tablet Take 1 tablet (81 mg total) by mouth daily. 12/11/13   Angiulli, Lavon Paganini, PA-C  atorvastatin (LIPITOR) 80 MG tablet Take 80 mg by mouth daily. 09/05/15   [provider]  calcium carbonate (TUMS - DOSED IN MG ELEMENTAL CALCIUM) 500 MG chewable tablet Chew 1 tablet by mouth as needed for indigestion or heartburn.    [provider]  carvedilol (COREG) 12.5 MG tablet Take 1 tablet (12.5 mg total) by mouth 2 (two) times daily with a meal. 06/03/17   Patwardhan, Manish J, MD  carvedilol (COREG) 3.125 MG tablet Take 1 tablet (3.125 mg total) by mouth 2 (two) times daily with a meal. 04/08/17 06/01/17  Elodia Florence., MD  cholecalciferol (VITAMIN D) 400 units TABS tablet Take 400 Units by mouth daily.    [provider]  clopidogrel (PLAVIX) 75 MG tablet Take 75 mg by mouth daily.  10/31/15   [provider]  Cyanocobalamin (B-12) 5000 MCG CAPS Take 5,000 mcg by mouth every morning.     [provider]  furosemide (LASIX) 40 MG tablet Take 1 tablet (40 mg total) by mouth 2 (two) times daily. 06/03/17   Patwardhan, Reynold Bowen, MD  lidocaine-prilocaine (EMLA) cream Apply 1 application topically daily as needed (port access). Apply to affected area once 01/06/17   Volanda Napoleon, MD  phenytoin (DILANTIN) 300 MG ER capsule Take 1 capsule (300 mg total) by mouth every  morning. 01/27/17   Wendie Agreste, MD  potassium chloride 20 MEQ TBCR Take 20 mEq by mouth daily. 06/03/17   Patwardhan, Reynold Bowen, MD  pyridOXINE (VITAMIN B-6) 100 MG tablet Take 100 mg by mouth every morning.     [provider]  ranitidine (ZANTAC) 150 MG tablet Take 150 mg by mouth daily as needed for heartburn.     [provider]  spironolactone (ALDACTONE) 25 MG tablet Take 1 tablet (25 mg total) by mouth daily. 06/03/17   Patwardhan, Reynold Bowen, MD  Thiamine HCl (VITAMIN B-1) 250 MG tablet Take 250 mg by mouth every morning.     [provider]   Social History   Socioeconomic History  . Marital status: Single    Spouse name: Not on file  . Number of children: Not on file  . Years of education: Not on file  . Highest education level: Not on file  Social Needs  . Financial resource strain: Not on file  . Food insecurity - worry: Not on file  . Food insecurity - inability: Not on file  . Transportation needs - medical: Not on file  . Transportation needs - non-medical: Not on file  Occupational History  . Not on file  Tobacco Use  . Smoking status: Former Smoker    Packs/day: 2.00    Years: 45.00    Pack years: 90.00    Types: Cigarettes    Last attempt to quit:  08/03/2003    Years since quitting: 13.8  . Smokeless tobacco: Current User    Types: Chew  . Tobacco comment: quit  smoking 10 years agop  Substance and Sexual Activity  . Alcohol use: No    Alcohol/week: 0.0 oz    Comment: quit 1980  . Drug use: No    Comment: none in past 30 years   . Sexual activity: Not on file  Other Topics Concern  . Not on file  Social History Narrative   ** Merged History Encounter **       Review of Systems  Constitutional: Negative for fatigue, fever and unexpected weight change.  Eyes: Negative for visual disturbance.  Respiratory: Negative for cough, chest tightness and shortness of breath.   Cardiovascular: Positive for chest pain. Negative for palpitations and leg swelling.  Gastrointestinal: Negative for abdominal pain and blood in stool.  Neurological: Positive for dizziness and light-headedness. Negative for headaches.       Objective:   Physical Exam  Constitutional: He is oriented to person, place, and time. He appears well-developed and well-nourished. No distress.  HENT:  Head: Normocephalic and atraumatic.  Eyes: EOM are normal. Pupils are equal, round, and reactive to light.  Neck: Neck supple.  Cardiovascular: Normal rate.  Pulmonary/Chest: Effort normal and breath sounds normal. No respiratory distress. He has no wheezes.  Musculoskeletal: Normal range of motion. He exhibits no edema.  Neurological: He is alert and oriented to person, place, and time.  Skin: Skin is warm and dry.  Psychiatric: He has a normal mood and affect. His behavior is normal.  Nursing note and vitals reviewed.   Vitals:   06/13/17 1513  BP: 98/76  Pulse: 76  Resp: 18  Temp: 97.7 F (36.5 C)  TempSrc: Oral  SpO2: 100%  Weight: 134 lb 12.8 oz (61.1 kg)  Height: 5\' 10"  (1.778 m)   Orthostatic VS for the past 24 hrs (Last 3 readings):  BP- Lying Pulse- Lying BP- Sitting Pulse- Sitting BP- Standing at  0 minutes Pulse- Standing at 0 minutes  06/13/17  1559 114/64 68 137/81 73 108/71 75       Assessment & Plan:  Nehemiah Sianez is a 66 y.o. male Congestive heart failure, unspecified HF chronicity, unspecified heart failure type ([place])  -Initially discussed concern of possibly being over diuresed with borderline low initial pressure. He has somewhat orthostatic with blood pressure evaluation, but denies persistent dizziness. Option of decreasing medication or decreasing Lasix dose discussed, but possibly decided to remain on same medications.  -Advised to monitor home blood pressures, and if running lower, or symptomatic with dizziness or lightheadedness, would recommend change in regimen.  -Keep follow-up with cardiology as planned.  Dizziness - Plan: Orthostatic vital signs  -Brief episode reported, asymptomatic at present. Plan as above, RTC precautions.  Chest pain, unspecified type  -Long-standing pain which is discussed with cardiology, denies any new chest pains. ER/911 precautions discussed if any acute, new, or worsening chest pain  No orders of the defined types were placed in this encounter.  Patient Instructions   If any new chest pains, call cardiologist or go to the ER.   Blood pressure does drop a little while standing, but your weight is the same as last week with cardiology. If you feel more lightheaded or dizzy at home, or notice blood pressures running low (under 100, or in low 100s and having lightheadedness) call cardiology or return here to discuss change in medications.   Return to the clinic or go to the nearest emergency room if any of your symptoms worsen or new symptoms occur.   IF you received an x-ray today, you will receive an invoice from [facility]. Please contact [facility] at [phone] with questions or concerns regarding your invoice.   IF you received labwork today, you will receive an invoice from [facility]. Please contact [facility] at [phone] with questions or concerns  regarding your invoice.   Our billing staff will not be able to assist you with questions regarding bills from these companies.  You will be contacted with the lab results as soon as they are available. The fastest way to get your results is to activate your My Chart account. Instructions are located on the last page of this paperwork. If you have not heard from [place] regarding the results in 2 weeks, please contact this office.      I personally performed the services described in this documentation, which was scribed in my presence. The recorded information has been reviewed and considered for accuracy and completeness, addended by me as needed, and agree with information above.  Signed,   [name], MD [facility] [place].  06/15/17 9:53 PM

## 2017-06-13 NOTE — Progress Notes (Signed)
This encounter was created in error - please disregard.

## 2017-06-13 NOTE — Patient Instructions (Addendum)
If any new chest pains, call cardiologist or go to the ER.   Blood pressure does drop a little while standing, but your weight is the same as last week with cardiology. If you feel more lightheaded or dizzy at home, or notice blood pressures running low (under 100, or in low 100s and having lightheadedness) call cardiology or return here to discuss change in medications.   Return to the clinic or go to the nearest emergency room if any of your symptoms worsen or new symptoms occur.   IF you received an x-ray today, you will receive an invoice from [facility]. Please contact [facility] at [phone] with questions or concerns regarding your invoice.   IF you received labwork today, you will receive an invoice from [facility]. Please contact [facility] at [phone] with questions or concerns regarding your invoice.   Our billing staff will not be able to assist you with questions regarding bills from these companies.  You will be contacted with the lab results as soon as they are available. The fastest way to get your results is to activate your My Chart account. Instructions are located on the last page of this paperwork. If you have not heard from [place] regarding the results in 2 weeks, please contact this office.

## 2017-06-13 NOTE — Patient Outreach (Signed)
[facility]) Care Management  06/13/2017  Gilbert [name] 02-Sep-1950 [id number]      EMMI-CHF RED ON EMMI ALERT Day # 9 Date: 06/12/17 Red Alert Reason: " Filled new prescriptions? No"   Outreach attempt # 1 to patient.No answer at present. RN CM left HIPAA compliant voicemail message along with contact info.         Plan: RN CM will make outreach attempt to patient within two business days if no return call from patient.    [name], RN,BSN,CCM [facility] Management Telephonic Care Management Coordinator Direct Phone: [phone] Toll Free: [phone] Fax: [phone]

## 2017-06-14 ENCOUNTER — Other Ambulatory Visit: Payer: Self-pay

## 2017-06-14 NOTE — Patient Outreach (Signed)
[facility]) Care Management  06/14/2017  Luisdaniel Buelow 06-10-1951 [id number]   EMMI-CHF RED ON EMMI ALERT Day # 9 Date: 06/12/17 Red Alert Reason: " Filled new prescriptions? No"     Outreach attempt #2 to patient.No answer and unable to leave voicemail message.      Plan: RN CM will send unsuccessful outreach letter to patient and close case if no response within 10 business days.   [name], RN,BSN,CCM [place] [facility] Telephonic Care Management Coordinator Direct Phone: [phone] Toll Free: [phone] Fax: [phone]

## 2017-06-25 IMAGING — CT NM PET TUM IMG RESTAG (PS) SKULL BASE T - THIGH
8 series · 25 of 25 positions shown · non-contrast
Comparison: 02/25/2016.

CLINICAL DATA: Subsequent treatment strategy for diffuse large cell
lymphoma..

EXAM:
NUCLEAR MEDICINE PET SKULL BASE TO THIGH
TECHNIQUE: 6.7 mCi F-18 FDG was injected intravenously. Full-ring PET imaging
was performed from the skull base to thigh after the radiotracer. CT
data was obtained and used for attenuation correction and anatomic
localization.
FASTING BLOOD GLUCOSE:  Value: 79 mg/dl

[Series 3: pet sk_thigh ac · axial · 5.0mm · 4.07mm/px · z∈[-1000,-64]mm · 4 of 235 slices shown]
[im 1/235]
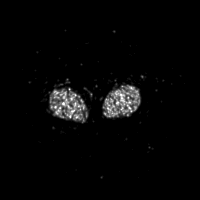
[im 79/235]
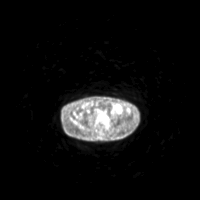
[im 157/235]
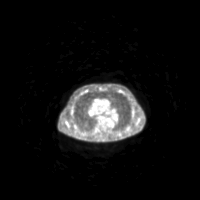
[im 235/235]
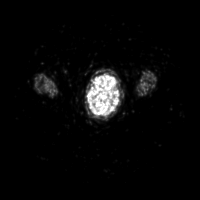

[Series 4: ct sk_thigh 5.0 b31f · axial · 5.0mm · 0.93mm/px · z∈[-1000,-64]mm · 5 of 235 slices shown]
[im 1/235]
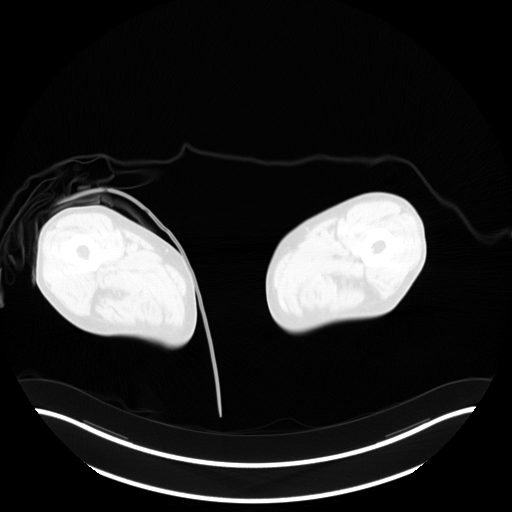
[im 59/235]
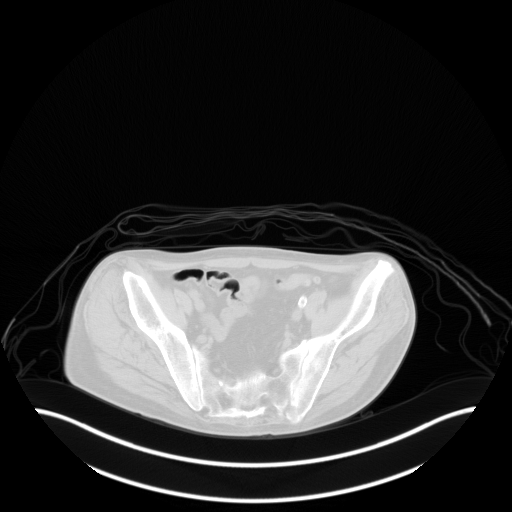
[im 118/235]
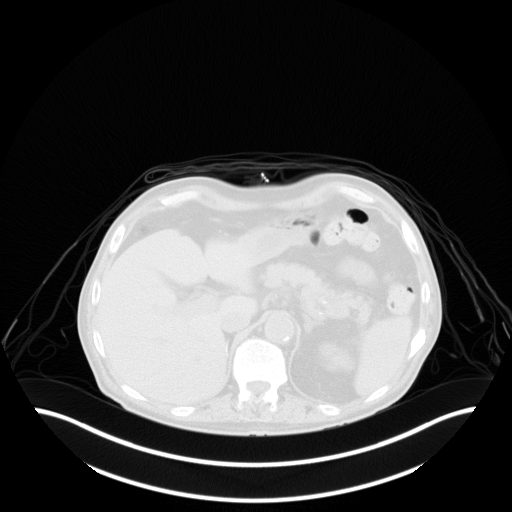
[im 176/235]
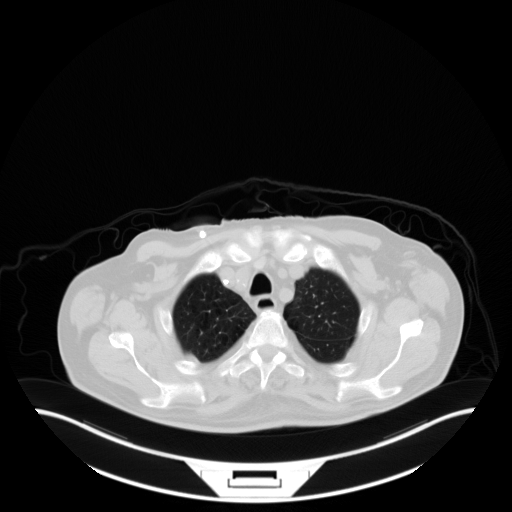
[im 235/235  brain]
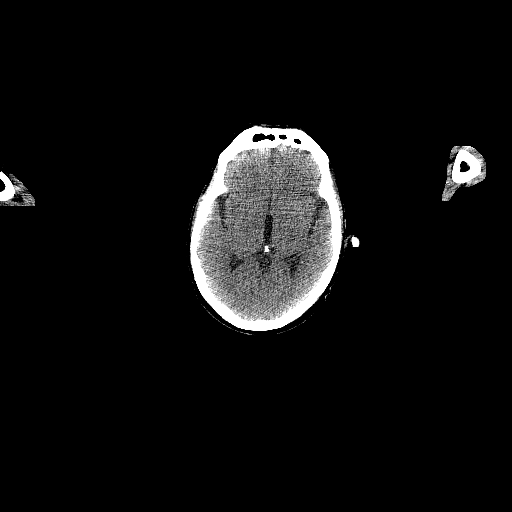

[Series 7: ct sk_thigh 5.0 b70f lung_bone · axial · 5.0mm · 0.69mm/px · z∈[-516,-244]mm · 2 of 69 slices shown]
[im 1/69  bone]
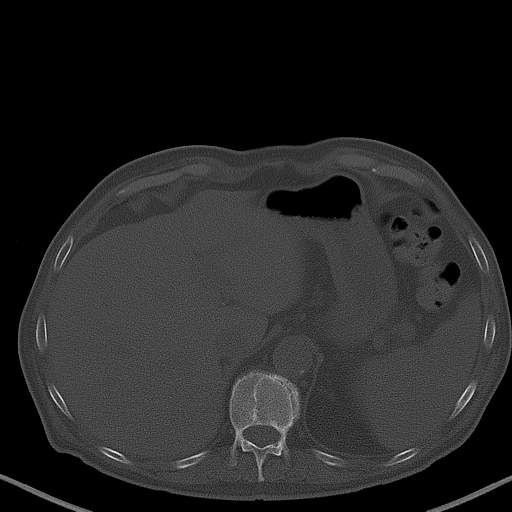
[im 69/69  bone]
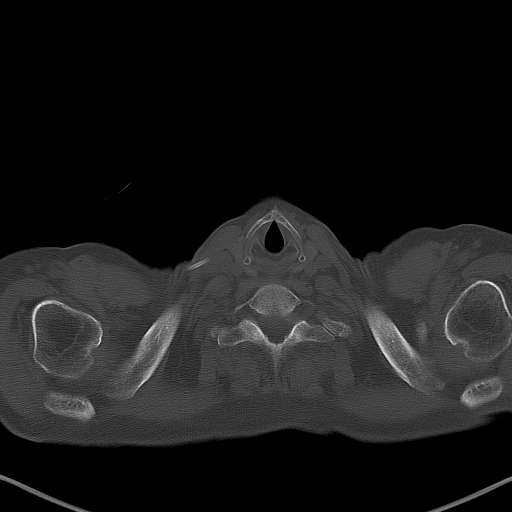

[Series 8: pet sk_thigh nac · axial · 5.0mm · 4.07mm/px · z∈[-1000,-64]mm · 5 of 235 slices shown]
[im 1/235]
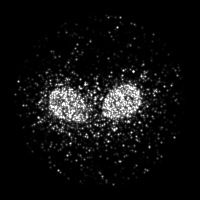
[im 59/235]
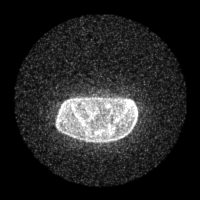
[im 118/235]
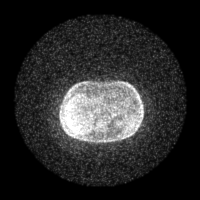
[im 176/235]
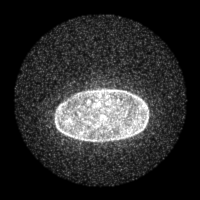
[im 235/235]
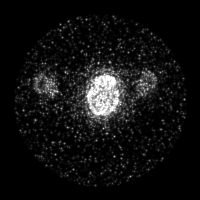

[Series 604: range-ct sk_thigh 5.0 (id)<alpha range> · 2 of 68 slices shown (1 of 2)]
[im 1/68]
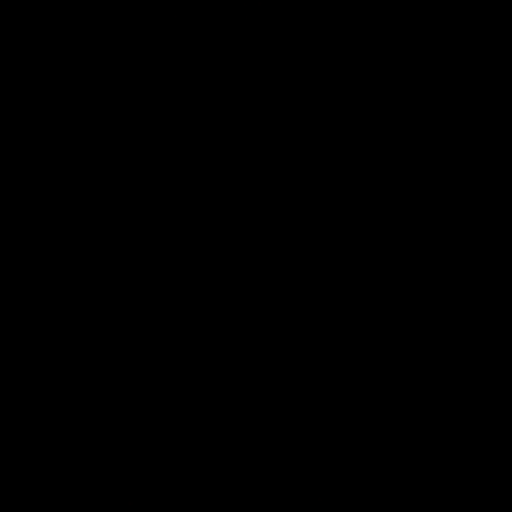
[im 68/68]
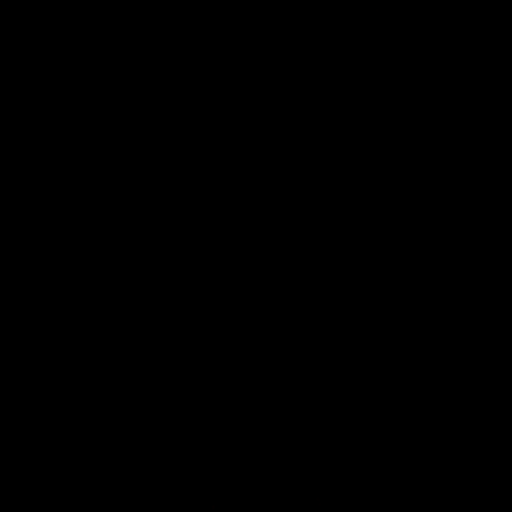

[Series 605: mip collection · coronal · 1.94mm/px · 1 of 32 slices shown]
[im 1/32]
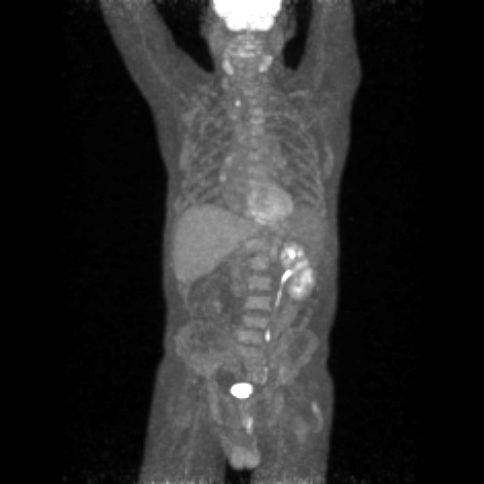

[Series 606: range-ct sk_thigh 5.0 (id)<alpha range> · 5 of 223 slices shown (2 of 2)]
[im 1/223]
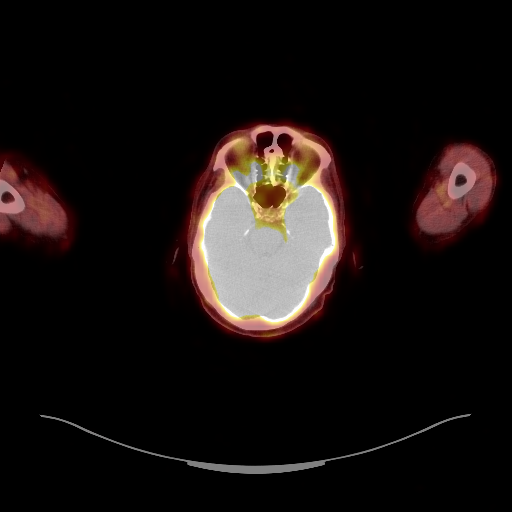
[im 56/223]
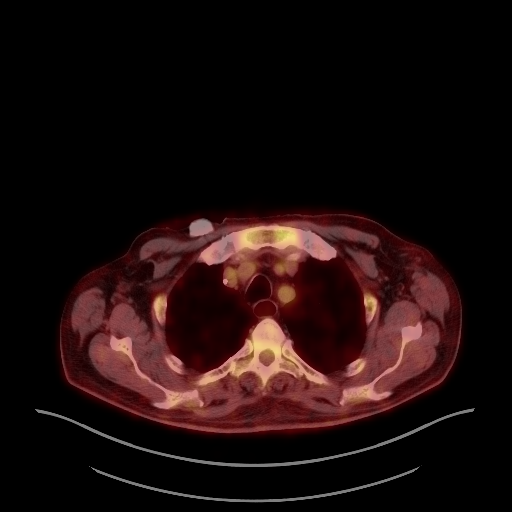
[im 112/223]
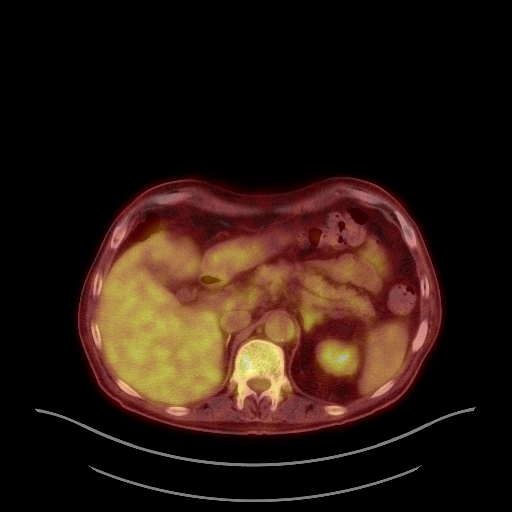
[im 167/223]
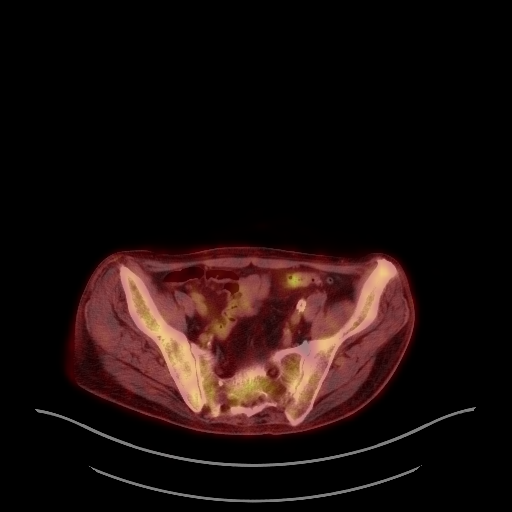
[im 223/223]
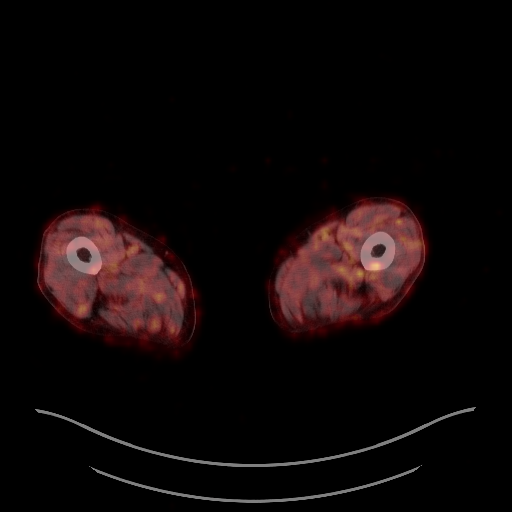

[Series 1177: results mm oncology reading · 4.0mm · 1.26mm/px · 1 of 5 slices shown]
[im 1/5]
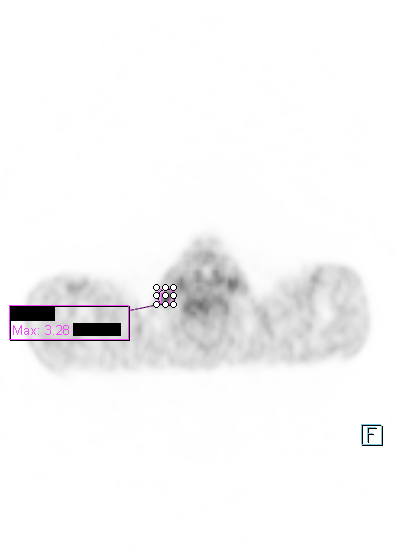

[25 of 25 positions shown; findings below may reference images not displayed]

FINDINGS: NECK

11 mm short axis level V right cervical lymph node measured
previously at 11 mm and SUV max = 5, now measures 10 mm short axis
with SUV max = 3.3.

The previously measured hypermetabolism in the right supraclavicular
region with SUV max = 7.9 now measures 8.8.

CHEST

Previously measured focus of hypermetabolism in the right hilum
demonstrates SUV max = 4.7 today which compares to 4.6 previously.

Mediastinal lymph nodes identified is new the hypermetabolic on the
previous study persists. Index prevascular node measured previously
at 4 mm and SUV max = 3.7 is seen on image 74 of series 4 today and
is stable at 4 mm short axis with SUV max = 2.9.

The hypermetabolism identified in the esophagus previously is
decreased in the interval with some minimal persistent FDG uptake in
the distal esophagus, near the EG junction demonstrating SUV max =
6.8.

11 mm hypermetabolic focus in the deep posterior right costophrenic
sulcus on the previous study has resolved completely in the
interval.

Right Port-A-Cath tip is positioned in the mid SVC. Centrilobular
emphysema noted in the lungs bilaterally with an upper lobe
predominance. Stable 2-3 mm peripheral right upper lobe pulmonary
nodule with no appreciable hypermetabolism. 5 mm left lower lobe
nodule (image 54 series 7) is unchanged and shows no hypermetabolic
FDG accumulation. No evidence of pleural effusion.

ABDOMEN/PELVIS

No abnormal hypermetabolic activity within the liver, pancreas,
adrenal glands, or spleen. No hypermetabolic lymph nodes in the
abdomen or pelvis.

There is abdominal aortic atherosclerosis without aneurysm. Right
kidney surgically absent. Left common iliac artery stent device
extends into the external iliac on the left. Left colonic
diverticulosis without diverticulitis.

SKELETON

Diffuse low level FDG uptake within the marrow spaces of the
skeleton is stable in the interval. The patient has developed new
hypermetabolic focus just lateral to the left greater trochanter
which may be related to a component of bursitis.
IMPRESSION: 1. Overall stable to slight interval improvement. Much of the
hypermetabolic lymphadenopathy seen previously is relatively stable
in size and shows stable to slight interval decrease in
hypermetabolism. No new or progressive disease is identified on CT
images for attenuation correction or as manifested by increased
hypermetabolism on PET imaging.
2. Overall stable appearance of diffuse low level FDG uptake within
the bony anatomy.
3. Interval decrease in distal esophageal hypermetabolism with low
level FDG accumulation identified only in the distal most aspect the
esophagus on today's study. The associated circumferential wall
thickening in the distal esophagus seen previously has also
improved.
4. Left colonic diverticulosis without diverticulitis.
5. Coronary artery and thoracoabdominal aortic atherosclerosis.

## 2017-06-28 ENCOUNTER — Other Ambulatory Visit: Payer: Self-pay

## 2017-06-28 NOTE — Patient Outreach (Signed)
[name] Care Management  06/28/2017  Gilbert [name] [birth year] [id number]   EMMI-CHF RED ON EMMI ALERT Day #9 Date:06/12/17 Red Alert Reason:" Filled new prescriptions? No"     Multiple attempts to establish contact with patient without success. No response from letter mailed to patient. Case is being closed at this time.      Plan: RN CM will notify [facility] administrative assistant of case status.   [name], RN,BSN,CCM [place] Management Telephonic Care Management Coordinator Direct Phone: [phone] Toll Free: [phone] Fax: [phone]

## 2017-07-13 ENCOUNTER — Telehealth: Payer: Self-pay

## 2017-07-13 NOTE — Telephone Encounter (Signed)
Left message on voicemail to call back and schedule AWV.

## 2017-07-29 NOTE — Telephone Encounter (Signed)
Encounter opened in error

## 2017-08-20 ENCOUNTER — Other Ambulatory Visit: Payer: Self-pay | Admitting: Family Medicine

## 2017-08-20 DIAGNOSIS — G40909 Epilepsy, unspecified, not intractable, without status epilepticus: Secondary | ICD-10-CM

## 2017-10-11 ENCOUNTER — Encounter: Payer: Self-pay | Admitting: Cardiology

## 2018-01-27 ENCOUNTER — Other Ambulatory Visit: Payer: Self-pay | Admitting: Family Medicine

## 2018-01-27 DIAGNOSIS — G40909 Epilepsy, unspecified, not intractable, without status epilepticus: Secondary | ICD-10-CM

## 2018-02-23 ENCOUNTER — Other Ambulatory Visit: Payer: Self-pay

## 2018-02-23 DIAGNOSIS — G40909 Epilepsy, unspecified, not intractable, without status epilepticus: Secondary | ICD-10-CM

## 2018-02-23 NOTE — Telephone Encounter (Signed)
Pt has come to the office and stated that he was needing his SZ medication Phenytoin (Dilantin) refilled. He only has 3 days left of medication. I have informed him that he needed to make an appointment for he has not been seen since 06/13/2017. Pt has an appointment tomorrow.   Thanks, [employer]

## 2018-02-24 ENCOUNTER — Ambulatory Visit: Payer: Medicare Other | Admitting: Family Medicine

## 2018-02-24 ENCOUNTER — Encounter: Payer: Self-pay | Admitting: Family Medicine

## 2018-02-24 ENCOUNTER — Other Ambulatory Visit: Payer: Self-pay

## 2018-02-24 ENCOUNTER — Ambulatory Visit (INDEPENDENT_AMBULATORY_CARE_PROVIDER_SITE_OTHER): Payer: Medicare Other | Admitting: Family Medicine

## 2018-02-24 VITALS — BP 130/56 | HR 54 | Temp 98.3°F | Ht 69.0 in | Wt 154.8 lb

## 2018-02-24 DIAGNOSIS — H938X2 Other specified disorders of left ear: Secondary | ICD-10-CM

## 2018-02-24 DIAGNOSIS — G40909 Epilepsy, unspecified, not intractable, without status epilepticus: Secondary | ICD-10-CM

## 2018-02-24 MED ORDER — PHENYTOIN SODIUM EXTENDED 300 MG PO CAPS: ORAL_CAPSULE | ORAL | 2 refills | Status: DC

## 2018-02-24 NOTE — Patient Instructions (Addendum)
I will refill the gabapentin at same dose for now. As discussed the prior levels were low, so could increase dose to prevent seizures, but without any seizure in over 10 years, will continue same dose for now. I do recommend dentist evaluation/monitoring as that medicine can affect your gums.   I will check into last pneumonia shot.  Let me know if we can refer you for colonoscopy or order Cologuard for colon cancer screening. Please schedule wellness exam/physical with me in the next few months.   I am concerned that the bump behind her left ear could be a skin cancer that is growing.  I have referred you to ear nose and throat to have that removed.  If you do not receive a call from that specialist within the next 2 weeks, let me know.  Schedule a physical/wellness exam in the next few months.  Return to the clinic or go to the nearest emergency room if any of your symptoms worsen or new symptoms occur.    IF you received an x-ray today, you will receive an invoice from [facility]. Please contact [facility] at [phone] with questions or concerns regarding your invoice.   IF you received labwork today, you will receive an invoice from [facility]. Please contact [facility] at [phone] with questions or concerns regarding your invoice.   Our billing staff will not be able to assist you with questions regarding bills from these companies.  You will be contacted with the lab results as soon as they are available. The fastest way to get your results is to activate your My Chart account. Instructions are located on the last page of this paperwork. If you have not heard from [place] regarding the results in 2 weeks, please contact this office.

## 2018-02-24 NOTE — Progress Notes (Signed)
Subjective:  By signing my name below, I, [name], attest that this documentation has been prepared under the direction and in the presence of Wendie Agreste, MD Electronically Signed: Ladene Artist, ED Scribe 02/24/2018 at 12:27 PM.   Patient ID: Gilbert [name], male    DOB: Feb 10, 1951, 67 y.o.   MRN: [id number]  Chief Complaint  Patient presents with  . Medication Refill    Dilantin for Sz   HPI Gilbert [name] is a 67 y.o. male who presents to Primary Care at [facility] for f/u. Last seen 06/2017 for hospital f/u. Followed by cardiology [facility] for CHF, PAD, CAD.  Seizure Disorder Last discussed 12/2016. Has been on dilantin for some time. Last seizure 10-15 yrs ago. Continued on dilantin 300 mg qd. Dilantin level 3.7 in Oct 2018. Discussed higher dosing in the past given low level but as seizure free decided to remain at same level. Discussed dental f/u given chronic dilantin use. - Pt reports that he still hasn't had a recent seizure and would like to remain on the same dose at this time. He has not seen a dentist within the past yr. Pt has natural teeth. Denies gum swelling/irritation, new side-effects.  Mass to L Ear Pt noticed a gradually enlarging bump behind the L ear x 4 months. He has not had masses removed from derm in the past.  Health Maintenance Pt states that he received a pneumonia vaccine at [facility]. He has not had a colonoscopy and declines referral at this time.  Patient Active Problem List   Diagnosis Date Noted  . CHF (congestive heart failure) ([place]) 06/01/2017  . AKI (acute kidney injury) ([place]) 04/07/2017  . Orthostatic hypotension 04/07/2017  . Diffuse non-Hodgkin's lymphoma of bone ([place]) 01/08/2016  . Diffuse large cell non-Hodgkin's lymphoma ([place]) 01/07/2016  . 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 Hematuria 10/29/2015  . Dysphagia, pharyngoesophageal phase   . Early satiety   . Esophageal stricture   . Gastritis and gastroduodenitis   . PAD (peripheral  artery disease) ([place]) 05/28/2014  . Cardiomyopathy, ischemic 04/03/2014  . History of left heart catheterization (LHC) 02/26/2014  . Arterial bleed, intraoperative 02/26/2014  . Seizures ([place]) 11/27/2013  . HTN (hypertension) 11/27/2013  . Hypomagnesemia 11/27/2013  . CAD (coronary artery disease), native coronary artery 11/26/2013  . Renal cancer ([place]) 10/19/2013  . Coronary artery calcification 09/25/2013  . Renal cyst 08/30/2013  . Loss of weight 08/03/2013   Past Medical History:  Diagnosis Date  . Abnormal liver enzymes    Chronic alkaline phosphatase elevation since 2014  . Acid reflux   . CAD (coronary artery disease)   . Cancer ([place]) 08/2013   right kidney cancer  . Cancer ([place])   . Coronary artery calcification seen on CAT scan   . Coronary atherosclerosis of native coronary artery   . Diffuse large cell non-Hodgkin's lymphoma ([place]) 01/07/2016  . Diffuse non-Hodgkin's lymphoma of bone ([place]) 01/08/2016  . Essential hypertension, benign   . Family history of anesthesia complication    mother-had stroke during anesthesia  . GERD (gastroesophageal reflux disease)   . Gout   . History of cardiac arrest 11/26/13   PTCA/Stenting of LM & Prox LAD with 4.0 x 18 mm Xience alpine stent. Used Impella Circulatory assist device. EF= 20-25%  . History of epilepsy    at least 10 years ago. 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 History of myocardial infarction less than 8 weeks    Stent placed  . History of  nephrectomy 11/2013   For renal cell carcinoma  . History of tobacco use   . HTN (hypertension)   . Hypercholesteremia   . Hyperlipemia   . Hypertension   . PAD (peripheral artery disease) ([place])   . Seizures ([place])   . Shortness of breath    with activity  . SOB (shortness of breath)   . Systolic and diastolic CHF, chronic (HCC)    Resolved. EF 45% 04/10/2014  . Therapeutic drug monitoring    Past Surgical History:  Procedure Laterality Date  . BALLOON DILATION N/A 01/21/2015    Procedure: BALLOON DILATION;  Surgeon: Gatha Mayer, MD;  Location: [facility] ENDOSCOPY;  Service: Endoscopy;  Laterality: N/A;  . CARDIAC CATHETERIZATION N/A 06/10/2015   Procedure: Left Heart Cath and Coronary Angiography;  Surgeon: Adrian Prows, MD;  Location: [facility] CV LAB;  Service: Cardiovascular;  Laterality: N/A;  . CORONARY ANGIOPLASTY WITH STENT PLACEMENT  2015   Left main coronary artery stenting on emergent basis along with circulatory support with Impella device through left leg. With 4.0 x 18 mm Xience alpine stent  . ESOPHAGOGASTRODUODENOSCOPY    . ESOPHAGOGASTRODUODENOSCOPY N/A 01/21/2015   Procedure: ESOPHAGOGASTRODUODENOSCOPY (EGD) with possible dilation.;  Surgeon: Gatha Mayer, MD;  Location: [facility] ENDOSCOPY;  Service: Endoscopy;  Laterality: N/A;  . EYE SURGERY Left 2 weeks ago   torn retina  . ILIAC ARTERY STENT Left 05/28/2014   dr Einar Gip  . LEFT HEART CATHETERIZATION WITH CORONARY ANGIOGRAM N/A 11/27/2013   Procedure: LEFT HEART CATHETERIZATION WITH CORONARY ANGIOGRAM;  Surgeon: [name], MD;  Location: [facility] CATH LAB;  Service: Cardiovascular;  Laterality: N/A;  . LOWER EXTREMITY ANGIOGRAM N/A 02/26/2014   Procedure: LOWER EXTREMITY ANGIOGRAM;  Surgeon: [name], MD;  Location: [facility] CATH LAB;  Service: Cardiovascular;  Laterality: N/A;  . LOWER EXTREMITY ANGIOGRAM N/A 05/28/2014   Procedure: LOWER EXTREMITY ANGIOGRAM;  Surgeon: [name], MD;  Location: [facility] CATH LAB;  Service: Cardiovascular;  Laterality: N/A;  . NEPHRECTOMY  11/2013  . NO PAST SURGERIES    . PERCUTANEOUS CORONARY STENT INTERVENTION (PCI-S)  11/27/2013   Procedure: PERCUTANEOUS CORONARY STENT INTERVENTION (PCI-S);  Surgeon: [name], MD;  Location: [facility] CATH LAB;  Service: Cardiovascular;;  . ROBOT ASSISTED LAPAROSCOPIC NEPHRECTOMY Right 10/19/2013   Procedure: ROBOTIC ASSISTED LAPAROSCOPIC NEPHRECTOMY,  EXTENSIVE ADHESIOLYSIS;  Surgeon: Alexis Frock, MD;  Location: [facility] ORS;  Service:  Urology;  Laterality: Right;   Allergies  Allergen Reactions  . Oxycodone Swelling and Other (See Comments)    Tongue and lips swell   Prior to Admission medications   Medication Sig Start Date End Date Taking? Authorizing Provider  acetaminophen (TYLENOL) 500 MG tablet Take 500 mg by mouth every 6 (six) hours as needed for mild pain or headache.    Yes [provider]  atorvastatin (LIPITOR) 80 MG tablet Take 80 mg by mouth daily. 09/05/15  Yes [provider]  calcium carbonate (TUMS - DOSED IN MG ELEMENTAL CALCIUM) 500 MG chewable tablet Chew 1 tablet by mouth as needed for indigestion or heartburn.   Yes [provider]  carvedilol (COREG) 12.5 MG tablet Take 1 tablet (12.5 mg total) by mouth 2 (two) times daily with a meal. 06/03/17  Yes Patwardhan, Manish J, MD  cholecalciferol (VITAMIN D) 400 units TABS tablet Take 400 Units by mouth daily.   Yes [provider]  clopidogrel (PLAVIX) 75 MG tablet Take 75 mg by mouth daily.  10/31/15  Yes [provider]  Cyanocobalamin (B-12) 5000 MCG CAPS Take 5,000 mcg by mouth every morning.    Yes [provider]  furosemide (LASIX) 40 MG tablet Take 1 tablet (40 mg total) by mouth 2 (two) times daily. 06/03/17  Yes Patwardhan, Manish J, MD  phenytoin (DILANTIN) 300 MG ER capsule TAKE 1 CAPSULE BY MOUTH IN THE MORNING 08/22/17  Yes Wendie Agreste, MD  potassium chloride 20 MEQ TBCR Take 20 mEq by mouth daily. 06/03/17  Yes Patwardhan, Manish J, MD  pyridOXINE (VITAMIN B-6) 100 MG tablet Take 100 mg by mouth every morning.    Yes [provider]  ranitidine (ZANTAC) 150 MG tablet Take 150 mg by mouth daily as needed for heartburn.    Yes [provider]  spironolactone (ALDACTONE) 25 MG tablet Take 1 tablet (25 mg total) by mouth daily. 06/03/17  Yes Patwardhan, Manish J, MD  Thiamine HCl (VITAMIN B-1) 250 MG tablet Take 250 mg by mouth every morning.    Yes [provider]    carvedilol (COREG) 3.125 MG tablet Take 1 tablet (3.125 mg total) by mouth 2 (two) times daily with a meal. 04/08/17 06/01/17  Elodia Florence., MD   Social History   Socioeconomic History  . Marital status: Single    Spouse name: Not on file  . Number of children: Not on file  . Years of education: Not on file  . Highest education level: Not on file  Occupational History  . Not on file  Social Needs  . Financial resource strain: Not on file  . Food insecurity:    Worry: Not on file    Inability: Not on file  . Transportation needs:    Medical: Not on file    Non-medical: Not on file  Tobacco Use  . Smoking status: Former Smoker    Packs/day: 2.00    Years: 45.00    Pack years: 90.00    Types: Cigarettes    Last attempt to quit: 08/03/2003    Years since quitting: 14.5  . Smokeless tobacco: Current User    Types: Chew  . Tobacco comment: quit  smoking 10 years agop  Substance and Sexual Activity  . Alcohol use: No    Alcohol/week: 0.0 oz    Comment: quit 1980  . Drug use: No    Comment: none in past 30 years   . Sexual activity: Not on file  Lifestyle  . Physical activity:    Days per week: Not on file    Minutes per session: Not on file  . Stress: Not on file  Relationships  . Social connections:    Talks on phone: Not on file    Gets together: Not on file    Attends religious service: Not on file    Active member of club or organization: Not on file    Attends meetings of clubs or organizations: Not on file    Relationship status: Not on file  . Intimate partner violence:    Fear of current or ex partner: Not on file    Emotionally abused: Not on file    Physically abused: Not on file    Forced sexual activity: Not on file  Other Topics Concern  . Not on file  Social History Narrative   ** Merged History Encounter **       Review of Systems  HENT: Negative for dental problem.   Skin:       + Mass to  L ear  Neurological: Negative for seizures.       Objective:   Physical Exam  Constitutional: He is oriented to person, place, and time. He appears well-developed and well-nourished. No distress.  HENT:  Head: Normocephalic and atraumatic.  Eyes: Conjunctivae and EOM are normal.  Neck: Neck supple. No tracheal deviation present.  Cardiovascular: Normal rate and regular rhythm.  Pulmonary/Chest: Effort normal and breath sounds normal. No respiratory distress.  Musculoskeletal: Normal range of motion.  Neurological: He is alert and oriented to person, place, and time.  Skin: Skin is warm and dry.  2 cm x 1.5 cm enlarged rounded mass behind L ear with abraded area centrally.  Psychiatric: He has a normal mood and affect. His behavior is normal.  Nursing note and vitals reviewed.  Vitals:   02/24/18 1158  BP: (!) 130/56  Pulse: (!) 54  Temp: 98.3 F (36.8 C)  TempSrc: Oral  SpO2: 100%  Weight: 154 lb 12.8 oz (70.2 kg)  Height: 5\' 9"  (1.753 m)      Assessment & Plan:   Gilbert [name] is a 67 y.o. male Mass of left ear - Plan: Ambulatory referral to ENT  -Concerning for keratoacanthoma versus possible squamous cell  versus irritated basal cell skin cancer. Based on location will refer to ENT for evaluation of excision.  Seizure disorder ([place]) - Plan: phenytoin (DILANTIN) 300 MG ER capsule  -Seizure-free for many years now, and tolerating current dose of Dilantin.  Discussed prior Dilantin levels and ideally would have higher dosing for improved reading but as had been discussed in the past he would like to continue same dose of Dilantin at this time.  Held on further testing given that decision.  If he changed his mind can check levels to determine dose change.  Risk of seizures discussed.   Meds ordered this encounter  Medications  . phenytoin (DILANTIN) 300 MG ER capsule    Sig: TAKE 1 CAPSULE BY MOUTH IN THE MORNING    Dispense:  90 capsule    Refill:  2   Patient Instructions   I will refill the gabapentin at same  dose for now. As discussed the prior levels were low, so could increase dose to prevent seizures, but without any seizure in over 10 years, will continue same dose for now. I do recommend dentist evaluation/monitoring as that medicine can affect your gums.   I will check into last pneumonia shot.  Let me know if we can refer you for colonoscopy or order Cologuard for colon cancer screening. Please schedule wellness exam/physical with me in the next few months.   I am concerned that the bump behind her left ear could be a skin cancer that is growing.  I have referred you to ear nose and throat to have that removed.  If you do not receive a call from that specialist within the next 2 weeks, let me know.  Schedule a physical/wellness exam in the next few months.  Return to the clinic or go to the nearest emergency room if any of your symptoms worsen or new symptoms occur.    IF you received an x-ray today, you will receive an invoice from [facility] Radiology. Please contact [facility] Radiology at [phone] with questions or concerns regarding your invoice.   IF you received labwork today, you will receive an invoice from [facility]. Please contact [facility] at [phone] with questions or concerns regarding your invoice.   Our billing staff will not be able to assist  you with questions regarding bills from these companies.  You will be contacted with the lab results as soon as they are available. The fastest way to get your results is to activate your My Chart account. Instructions are located on the last page of this paperwork. If you have not heard from [place] regarding the results in 2 weeks, please contact this office.       I personally performed the services described in this documentation, which was scribed in my presence. The recorded information has been reviewed and considered for accuracy and completeness, addended by me as needed, and agree with information above.  Signed,    [name], MD [facility].  02/25/18 3:38 PM

## 2018-03-07 ENCOUNTER — Inpatient Hospital Stay (HOSPITAL_BASED_OUTPATIENT_CLINIC_OR_DEPARTMENT_OTHER): Payer: Medicare Other | Admitting: Family

## 2018-03-07 ENCOUNTER — Inpatient Hospital Stay: Payer: Medicare Other | Admitting: Family

## 2018-03-07 ENCOUNTER — Inpatient Hospital Stay: Payer: Medicare Other

## 2018-03-07 ENCOUNTER — Other Ambulatory Visit: Payer: Self-pay

## 2018-03-07 ENCOUNTER — Inpatient Hospital Stay: Payer: Medicare Other | Attending: Hematology & Oncology

## 2018-03-07 ENCOUNTER — Encounter: Payer: Self-pay | Admitting: Family

## 2018-03-07 DIAGNOSIS — Z8572 Personal history of non-Hodgkin lymphomas: Secondary | ICD-10-CM | POA: Insufficient documentation

## 2018-03-07 DIAGNOSIS — D630 Anemia in neoplastic disease: Secondary | ICD-10-CM

## 2018-03-07 DIAGNOSIS — Z905 Acquired absence of kidney: Secondary | ICD-10-CM

## 2018-03-07 DIAGNOSIS — Z95828 Presence of other vascular implants and grafts: Secondary | ICD-10-CM

## 2018-03-07 DIAGNOSIS — C8589 Other specified types of non-Hodgkin lymphoma, extranodal and solid organ sites: Secondary | ICD-10-CM

## 2018-03-07 DIAGNOSIS — C649 Malignant neoplasm of unspecified kidney, except renal pelvis: Secondary | ICD-10-CM

## 2018-03-07 DIAGNOSIS — Z85528 Personal history of other malignant neoplasm of kidney: Secondary | ICD-10-CM

## 2018-03-07 DIAGNOSIS — T451X5A Adverse effect of antineoplastic and immunosuppressive drugs, initial encounter: Secondary | ICD-10-CM

## 2018-03-07 DIAGNOSIS — R220 Localized swelling, mass and lump, head: Secondary | ICD-10-CM

## 2018-03-07 DIAGNOSIS — Z9221 Personal history of antineoplastic chemotherapy: Secondary | ICD-10-CM

## 2018-03-07 DIAGNOSIS — D6481 Anemia due to antineoplastic chemotherapy: Secondary | ICD-10-CM

## 2018-03-07 LAB — CBC WITH DIFFERENTIAL (CANCER CENTER ONLY)
Basophils Absolute: 0 10*3/uL (ref 0.0–0.1)
Basophils Relative: 0 %
Eosinophils Absolute: 0.6 10*3/uL — ABNORMAL HIGH (ref 0.0–0.5)
Eosinophils Relative: 10 %
HCT: 32.4 % — ABNORMAL LOW (ref 38.7–49.9)
Hemoglobin: 10.9 g/dL — ABNORMAL LOW (ref 13.0–17.1)
Lymphocytes Relative: 16 %
Lymphs Abs: 1 10*3/uL (ref 0.9–3.3)
MCH: 31.8 pg (ref 28.0–33.4)
MCHC: 33.6 g/dL (ref 32.0–35.9)
MCV: 94.5 fL (ref 82.0–98.0)
Monocytes Absolute: 0.5 10*3/uL (ref 0.1–0.9)
Monocytes Relative: 8 %
Neutro Abs: 4.2 10*3/uL (ref 1.5–6.5)
Neutrophils Relative %: 66 %
Platelet Count: 113 10*3/uL — ABNORMAL LOW (ref 145–400)
RBC: 3.43 MIL/uL — ABNORMAL LOW (ref 4.20–5.70)
RDW: 13.3 % (ref 11.1–15.7)
WBC Count: 6.4 10*3/uL (ref 4.0–10.0)

## 2018-03-07 LAB — CMP (CANCER CENTER ONLY)
ALBUMIN: 3.5 g/dL (ref 3.5–5.0)
ALT: 16 U/L (ref 10–47)
AST: 29 U/L (ref 11–38)
Alkaline Phosphatase: 115 U/L — ABNORMAL HIGH (ref 26–84)
Anion gap: 5 (ref 5–15)
BILIRUBIN TOTAL: 0.5 mg/dL (ref 0.2–1.6)
BUN: 22 mg/dL (ref 7–22)
CALCIUM: 9.6 mg/dL (ref 8.0–10.3)
CO2: 26 mmol/L (ref 18–33)
CREATININE: 1.5 mg/dL — AB (ref 0.60–1.20)
Chloride: 107 mmol/L (ref 98–108)
Glucose, Bld: 111 mg/dL (ref 73–118)
Potassium: 4.1 mmol/L (ref 3.3–4.7)
Sodium: 138 mmol/L (ref 128–145)
Total Protein: 6.5 g/dL (ref 6.4–8.1)

## 2018-03-07 MED ORDER — SODIUM CHLORIDE 0.9% FLUSH
10.0000 mL | INTRAVENOUS | Status: DC | PRN
  Administered 2018-03-07: 10 mL via INTRAVENOUS
  Filled 2018-03-07: qty 10

## 2018-03-07 MED ORDER — HEPARIN SOD (PORK) LOCK FLUSH 100 UNIT/ML IV SOLN
500.0000 [IU] | Freq: Once | INTRAVENOUS | Status: AC
  Administered 2018-03-07: 500 [IU] via INTRAVENOUS
  Filled 2018-03-07: qty 5

## 2018-03-07 NOTE — Progress Notes (Signed)
Hematology and Oncology Follow Up Visit  Gilbert [name] [id number] 1950-10-20 67 y.o. 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 We have not seen him since September of last year. He states that he is doing well and golfing almost every day.  He has occasional episodes of fatigue and will take breaks to rest as needed.  He is concerned with a mas behind his left ear. This measures 2 cm in height, 1 cm width and 1 cm depth. It is red and inflamed appearing to be a skin cancer. He has been referred to ENT Dr. Benjamine Mola by his PCP for further evaluation and removal. He is waiting to hear from their office for an appointment. He has not noted any other skin changes.  He denies fever, chills, n/v, cough, rash, dizziness, headache SOB, chest pain, palpitations, abdominal pain or changes in bowel or bladder habits.  He has a loose stool right after having his coffee in the mornings.  No episodes of bleeding, no bruising or petechiae. His left eye is mildly blood shot today but he has no c/o blurriness or changes in vision.  No swelling, tenderness, numbness or tingling in his extremities. No c/o pain.  No lymphadenopathy noted on exam.   He has a good appetite and is staying well hydrated. His weight is up 22 lbs since we last saw him.  ECOG Performance Status: 1 - Symptomatic but completely ambulatory  Medications:  Allergies as of 03/07/2018      Reactions   Oxycodone Swelling, Other (See Comments)   Tongue and lips swell      Medication List        Accurate as of 03/07/18  4:22 PM. Always use your most recent med list.            acetaminophen 500 MG tablet Commonly known as:  TYLENOL Take 500 mg by mouth every 6 (six) hours as needed for mild pain or headache.   atorvastatin 80 MG tablet Commonly known as:  LIPITOR Take 80 mg by mouth daily.   B-12 5000 MCG Caps Take 5,000 mcg by mouth every morning.   calcium carbonate 500 MG chewable tablet Commonly known as:  TUMS - dosed in mg elemental calcium Chew 1 tablet by mouth as needed for indigestion or heartburn.   carvedilol 3.125 MG tablet Commonly known as:  COREG Take 1 tablet (3.125 mg total) by mouth 2 (two) times daily with a meal.   carvedilol 12.5 MG tablet Commonly known as:  COREG Take 1 tablet (12.5 mg total) by mouth 2 (two) times daily with a meal.   cholecalciferol 400 units Tabs tablet Commonly known as:  VITAMIN D Take 400 Units by mouth daily.   clopidogrel 75 MG tablet Commonly known as:  PLAVIX Take 75 mg by mouth daily.   ENTRESTO 97-103 MG Generic drug:  sacubitril-valsartan Take 1 tablet by mouth 2 (two) times daily.   furosemide 40 MG tablet Commonly known as:  LASIX Take 1 tablet (40 mg total) by mouth 2 (two) times daily.   phenytoin 300 MG ER capsule Commonly known as:  DILANTIN TAKE 1 CAPSULE  BY MOUTH IN THE MORNING   Potassium Chloride ER 20 MEQ Tbcr Take 20 mEq by mouth daily.   pyridOXINE 100 MG tablet Commonly known as:  VITAMIN B-6 Take 100 mg by mouth every morning.   ranitidine 150 MG tablet Commonly known as:  ZANTAC Take 150 mg by mouth daily as needed for heartburn.   spironolactone 25 MG tablet Commonly known as:  ALDACTONE Take 1 tablet (25 mg total) by mouth daily.   vitamin B-1 250 MG tablet Take 250 mg by mouth every morning.       Allergies:  Allergies  Allergen Reactions  . Oxycodone Swelling and Other (See Comments)    Tongue and lips swell    Past Medical History, Surgical history, Social history, and Family History were reviewed and updated.  Review of Systems: All other  10 point review of systems is negative.   Physical Exam:  vitals were not taken for this visit.   Wt Readings from Last 3 Encounters:  03/07/18 156 lb 1.9 oz (70.8 kg)  02/24/18 154 lb 12.8 oz (70.2 kg)  06/13/17 134 lb 12.8 oz (61.1 kg)    Ocular: Sclerae unicteric, pupils equal, round and reactive to light Ear-nose-throat: Oropharynx clear, dentition fair, behind left ear is a 2 cm x 1 cm x 1 cm mass  Lymphatic: No cervical, supraclavicular and axillary adenopathy Lungs no rales or rhonchi, good excursion bilaterally Heart regular rate and rhythm, no murmur appreciated Abd soft, nontender, positive bowel sounds, no liver or spleen tip palpated on exam, no fluid wave  MSK no focal spinal tenderness, no joint edema Neuro: non-focal, well-oriented, appropriate affect Breasts: Deferred   Lab Results  Component Value Date   WBC 6.4 03/07/2018   HGB 10.9 (L) 03/07/2018   HCT 32.4 (L) 03/07/2018   MCV 94.5 03/07/2018   PLT 113 (L) 03/07/2018   Lab Results  Component Value Date   FERRITIN 617 (H) 06/30/2016   IRON 63 06/30/2016   TIBC 169 (L) 06/30/2016   UIBC 106 (L) 06/30/2016   IRONPCTSAT 37 06/30/2016   Lab Results  Component Value Date   RBC 3.43 (L) 03/07/2018   No results found for: KPAFRELGTCHN, LAMBDASER, KAPLAMBRATIO No results found for: IGGSERUM, IGA, IGMSERUM No results found for: Odetta Pink, SPEI   Chemistry      Component Value Date/Time   NA 138 03/07/2018 1453   NA 140 04/20/2017 1417   K 4.1 03/07/2018 1453   K 3.6 04/20/2017 1417   CL 107 03/07/2018 1453   CL 109 (H) 04/20/2017 1417   CO2 26 03/07/2018 1453   CO2 27 04/20/2017 1417   BUN 22 03/07/2018 1453   BUN 15 04/20/2017 1417   CREATININE 1.50 (H) 03/07/2018 1453   CREATININE 1.3 (H) 04/20/2017 1417      Component Value Date/Time   CALCIUM 9.6 03/07/2018 1453   CALCIUM 8.8 04/20/2017 1417   ALKPHOS 115 (H) 03/07/2018 1453    ALKPHOS 104 (H) 04/20/2017 1417   AST 29 03/07/2018 1453   ALT 16 03/07/2018 1453   ALT 27 04/20/2017 1417   BILITOT 0.5 03/07/2018 1453      Impression and Plan: Gilbert [name] is a very pleasant 67 yo caucasian gentleman with history of 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 He completed 8 cycles of R-CEOP in November 2017.  He is doing well and so far there has been no evidence of recurrence. He is here today with what appears to be a skin cancer behind his left ear. He is waiting to hear from ENT for appointment. He will have this removed and sent for pathology.  We will continue to follow along with him and plan to see him back in another 2 months.  He will contact our office with any questions or concerns. We can certainly see him sooner if need be.   Laverna Peace, NP 8/6/20194:22 PM

## 2018-03-08 LAB — FERRITIN: FERRITIN: 108 ng/mL (ref 24–336)

## 2018-03-08 LAB — LACTATE DEHYDROGENASE: LDH: 161 U/L (ref 98–192)

## 2018-03-08 LAB — IRON AND TIBC
IRON: 60 ug/dL (ref 42–163)
Saturation Ratios: 26 % — ABNORMAL LOW (ref 42–163)
TIBC: 230 ug/dL (ref 202–409)
UIBC: 170 ug/dL

## 2018-03-08 LAB — ERYTHROPOIETIN: Erythropoietin: 10.9 m[IU]/mL (ref 2.6–18.5)

## 2018-03-13 ENCOUNTER — Encounter: Payer: Self-pay | Admitting: Family Medicine

## 2018-03-13 NOTE — Progress Notes (Signed)
Erroneous

## 2018-03-14 ENCOUNTER — Inpatient Hospital Stay: Payer: Medicare Other

## 2018-03-14 ENCOUNTER — Telehealth: Payer: Self-pay

## 2018-03-14 NOTE — Telephone Encounter (Signed)
Pt message sent to Referrals.    Pt has not heard from referral to ear specialist - faxed on 8/1  Please follow up.  Thanks

## 2018-03-16 ENCOUNTER — Telehealth: Payer: Self-pay | Admitting: Family Medicine

## 2018-03-16 NOTE — Telephone Encounter (Signed)
Called pt daughter to give her his appointment info for his ent referral at Dr. Benjamine Mola office on 04/19/18 at 1:50

## 2018-03-23 ENCOUNTER — Other Ambulatory Visit: Payer: Self-pay

## 2018-03-23 ENCOUNTER — Ambulatory Visit (INDEPENDENT_AMBULATORY_CARE_PROVIDER_SITE_OTHER): Payer: Medicare Other | Admitting: Family Medicine

## 2018-03-23 ENCOUNTER — Encounter: Payer: Self-pay | Admitting: Family Medicine

## 2018-03-23 VITALS — BP 124/80 | HR 85 | Temp 98.4°F | Ht 70.0 in | Wt 155.6 lb

## 2018-03-23 DIAGNOSIS — R112 Nausea with vomiting, unspecified: Secondary | ICD-10-CM

## 2018-03-23 DIAGNOSIS — J069 Acute upper respiratory infection, unspecified: Secondary | ICD-10-CM | POA: Diagnosis not present

## 2018-03-23 DIAGNOSIS — R05 Cough: Secondary | ICD-10-CM

## 2018-03-23 DIAGNOSIS — R066 Hiccough: Secondary | ICD-10-CM

## 2018-03-23 DIAGNOSIS — R059 Cough, unspecified: Secondary | ICD-10-CM

## 2018-03-23 MED ORDER — DOXYCYCLINE HYCLATE 100 MG PO TABS: 100.0000 mg | ORAL_TABLET | Freq: Two times a day (BID) | ORAL | 0 refills | Status: DC

## 2018-03-23 MED ORDER — ONDANSETRON 4 MG PO TBDP: 4.0000 mg | ORAL_TABLET | Freq: Three times a day (TID) | ORAL | 0 refills | Status: DC | PRN

## 2018-03-23 NOTE — Progress Notes (Signed)
Subjective:  By signing my name below, I, [name], attest that this documentation has been prepared under the direction and in the presence of Wendie Agreste, MD Electronically Signed: Ladene Artist, ED Scribe 03/23/2018 at 12:14 PM.   Patient ID: Gilbert [name], male    DOB: Mar 09, 1951, 67 y.o.   MRN: [id number]  Chief Complaint  Patient presents with  . Cough    runny noise  . Emesis    going on for 2 days    HPI Gilbert [name] is a 67 y.o. male who presents to Primary Care at [facility] complaining of cough x 3-4 days. H/o non-hodgkin's lymphoma, renal CA, PAD, CAD, CHF. Last seen 8/6 at oncology s/p 8 cycles of R-CEOP in 06/2016. No evidence of recurrence noted but planning to see ENT for poss skin CA of ear. Cardiologist Dr. Virgina Jock.  Today, pt reports with minimally productive cough with yellow sputum, nasal congestion, rhinorrhea, hiccups x 3 days, nausea, 4 episodes of emesis yesterday, upper abdominal pain onset last night, worsened with deep breathing and palpation. Reports yellow colored urine which is a little more yellow than usual. Pt had a normal BM this morning. States he has been drinking plenty of fluids. Pt has tried Copywriter, advertising Plus without improvement. Denies fever, hematochezia, diarrhea, constipation, emesis today.  Patient Active Problem List   Diagnosis Date Noted  . CHF (congestive heart failure) ([place]) 06/01/2017  . AKI (acute kidney injury) ([place]) 04/07/2017  . Orthostatic hypotension 04/07/2017  . Diffuse non-Hodgkin's lymphoma of bone ([place]) 01/08/2016  . 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 Hematuria 10/29/2015  . Dysphagia, pharyngoesophageal phase   . Early satiety   . Esophageal stricture   . Gastritis and gastroduodenitis   . PAD (peripheral artery disease) ([place]) 05/28/2014  . Cardiomyopathy, ischemic 04/03/2014  . History of left heart catheterization (LHC) 02/26/2014  . Arterial  bleed, intraoperative 02/26/2014  . Seizures ([place]) 11/27/2013  . HTN (hypertension) 11/27/2013  . Hypomagnesemia 11/27/2013  . CAD (coronary artery disease), native coronary artery 11/26/2013  . Renal cancer ([place]) 10/19/2013  . Coronary artery calcification 09/25/2013  . Renal cyst 08/30/2013  . Loss of weight 08/03/2013   Past Medical History:  Diagnosis Date  . Abnormal liver enzymes    Chronic alkaline phosphatase elevation since 2014  . Acid reflux   . CAD (coronary artery disease)   . Cancer ([place]) 08/2013   right kidney cancer  . Cancer ([place])   . Coronary artery calcification seen on CAT scan   . Coronary atherosclerosis of native coronary artery   . 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 Essential hypertension, benign   . Family history of anesthesia complication    mother-had stroke during anesthesia  . GERD (gastroesophageal reflux disease)   . Gout   . History of cardiac arrest 11/26/13   PTCA/Stenting of LM & Prox LAD with 4.0 x 18 mm Xience alpine stent. Used Impella Circulatory assist device. EF= 20-25%  . History of epilepsy    at least 10 years ago. 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 History of myocardial infarction less than 8 weeks    Stent placed  . History of nephrectomy 11/2013   For renal cell carcinoma  . History of tobacco use   . HTN (hypertension)   . Hypercholesteremia   . Hyperlipemia   . Hypertension   . PAD (  peripheral artery disease) ([place])   . Seizures ([place])   . Shortness of breath    with activity  . SOB (shortness of breath)   . Systolic and diastolic CHF, chronic (HCC)    Resolved. EF 45% 04/10/2014  . Therapeutic drug monitoring    Past Surgical History:  Procedure Laterality Date  . BALLOON DILATION N/A 01/21/2015   Procedure: BALLOON DILATION;  Surgeon: Gatha Mayer, MD;  Location[facility] ENDOSCOPY;  Service: Endoscopy;  Laterality: N/A;  . CARDIAC  CATHETERIZATION N/A 06/10/2015   Procedure: Left Heart Cath and Coronary Angiography;  Surgeon: Adrian Prows, MD;  Location: [facility] CV LAB;  Service: Cardiovascular;  Laterality: N/A;  . CORONARY ANGIOPLASTY WITH STENT PLACEMENT  2015   Left main coronary artery stenting on emergent basis along with circulatory support with Impella device through left leg. With 4.0 x 18 mm Xience alpine stent  . ESOPHAGOGASTRODUODENOSCOPY    . ESOPHAGOGASTRODUODENOSCOPY N/A 01/21/2015   Procedure: ESOPHAGOGASTRODUODENOSCOPY (EGD) with possible dilation.;  Surgeon: Gatha Mayer, MD;  Location: [facility] ENDOSCOPY;  Service: Endoscopy;  Laterality: N/A;  . EYE SURGERY Left 2 weeks ago   torn retina  . ILIAC ARTERY STENT Left 05/28/2014   dr Einar Gip  . LEFT HEART CATHETERIZATION WITH CORONARY ANGIOGRAM N/A 11/27/2013   Procedure: LEFT HEART CATHETERIZATION WITH CORONARY ANGIOGRAM;  Surgeon: [name], MD;  Location[facility] CATH LAB;  Service: Cardiovascular;  Laterality: N/A;  . LOWER EXTREMITY ANGIOGRAM N/A 02/26/2014   Procedure: LOWER EXTREMITY ANGIOGRAM;  Surgeon: [name], MD;  Location: [facility] CATH LAB;  Service: Cardiovascular;  Laterality: N/A;  . LOWER EXTREMITY ANGIOGRAM N/A 05/28/2014   Procedure: LOWER EXTREMITY ANGIOGRAM;  Surgeon: [name], MD;  Location: [facility] CATH LAB;  Service: Cardiovascular;  Laterality: N/A;  . NEPHRECTOMY  11/2013  . NO PAST SURGERIES    . PERCUTANEOUS CORONARY STENT INTERVENTION (PCI-S)  11/27/2013   Procedure: PERCUTANEOUS CORONARY STENT INTERVENTION (PCI-S);  Surgeon: [name], MD;  Location: [facility] CATH LAB;  Service: Cardiovascular;;  . ROBOT ASSISTED LAPAROSCOPIC NEPHRECTOMY Right 10/19/2013   Procedure: ROBOTIC ASSISTED LAPAROSCOPIC NEPHRECTOMY,  EXTENSIVE ADHESIOLYSIS;  Surgeon: Alexis Frock, MD;  Location: [facility] ORS;  Service: Urology;  Laterality: Right;   Allergies  Allergen Reactions  . Oxycodone Swelling and Other (See Comments)    Tongue and lips  swell   Prior to Admission medications   Medication Sig Start Date End Date Taking? Authorizing Provider  acetaminophen (TYLENOL) 500 MG tablet Take 500 mg by mouth every 6 (six) hours as needed for mild pain or headache.     [provider]  atorvastatin (LIPITOR) 80 MG tablet Take 80 mg by mouth daily. 09/05/15   [provider]  calcium carbonate (TUMS - DOSED IN MG ELEMENTAL CALCIUM) 500 MG chewable tablet Chew 1 tablet by mouth as needed for indigestion or heartburn.    [provider]  carvedilol (COREG) 12.5 MG tablet Take 1 tablet (12.5 mg total) by mouth 2 (two) times daily with a meal. 06/03/17   Patwardhan, Manish J, MD  carvedilol (COREG) 3.125 MG tablet Take 1 tablet (3.125 mg total) by mouth 2 (two) times daily with a meal. 04/08/17 06/01/17  Elodia Florence., MD  cholecalciferol (VITAMIN D) 400 units TABS tablet Take 400 Units by mouth daily.    [provider]  clopidogrel (PLAVIX) 75 MG tablet Take 75 mg by mouth daily.  10/31/15   [provider]  Cyanocobalamin (B-12) 5000  MCG CAPS Take 5,000 mcg by mouth every morning.     [provider]  ENTRESTO 97-103 MG Take 1 tablet by mouth 2 (two) times daily. 02/23/18   [provider]  furosemide (LASIX) 40 MG tablet Take 1 tablet (40 mg total) by mouth 2 (two) times daily. Patient taking differently: Take 20 mg by mouth 2 (two) times daily.  06/03/17   Patwardhan, Reynold Bowen, MD  phenytoin (DILANTIN) 300 MG ER capsule TAKE 1 CAPSULE BY MOUTH IN THE MORNING 02/24/18   Wendie Agreste, MD  potassium chloride 20 MEQ TBCR Take 20 mEq by mouth daily. 06/03/17   Patwardhan, Reynold Bowen, MD  pyridOXINE (VITAMIN B-6) 100 MG tablet Take 100 mg by mouth every morning.     [provider]  ranitidine (ZANTAC) 150 MG tablet Take 150 mg by mouth daily as needed for heartburn.     [provider]  spironolactone (ALDACTONE) 25 MG tablet Take 1 tablet (25 mg total) by mouth  daily. Patient not taking: Reported on 03/07/2018 06/03/17   Nigel Mormon, MD  Thiamine HCl (VITAMIN B-1) 250 MG tablet Take 250 mg by mouth every morning.     [provider]   Social History   Socioeconomic History  . Marital status: Single    Spouse name: Not on file  . Number of children: Not on file  . Years of education: Not on file  . Highest education level: Not on file  Occupational History  . Not on file  Social Needs  . Financial resource strain: Not on file  . Food insecurity:    Worry: Not on file    Inability: Not on file  . Transportation needs:    Medical: Not on file    Non-medical: Not on file  Tobacco Use  . Smoking status: Former Smoker    Packs/day: 2.00    Years: 45.00    Pack years: 90.00    Types: Cigarettes    Last attempt to quit: 08/03/2003    Years since quitting: 14.6  . Smokeless tobacco: Current User    Types: Chew  . Tobacco comment: quit  smoking 10 years agop  Substance and Sexual Activity  . Alcohol use: No    Alcohol/week: 0.0 standard drinks    Comment: quit 1980  . Drug use: No    Comment: none in past 30 years   . Sexual activity: Not on file  Lifestyle  . Physical activity:    Days per week: Not on file    Minutes per session: Not on file  . Stress: Not on file  Relationships  . Social connections:    Talks on phone: Not on file    Gets together: Not on file    Attends religious service: Not on file    Active member of club or organization: Not on file    Attends meetings of clubs or organizations: Not on file    Relationship status: Not on file  . Intimate partner violence:    Fear of current or ex partner: Not on file    Emotionally abused: Not on file    Physically abused: Not on file    Forced sexual activity: Not on file  Other Topics Concern  . Not on file  Social History Narrative   ** Merged History Encounter **       Review of Systems  Constitutional: Negative for fever.  HENT: Positive for  congestion and rhinorrhea.  Respiratory: Positive for cough.   Gastrointestinal: Positive for abdominal pain, nausea and vomiting. Negative for constipation and diarrhea.      Objective:   Physical Exam  Constitutional: He is oriented to person, place, and time. He appears well-developed and well-nourished.  HENT:  Head: Normocephalic and atraumatic.  Right Ear: Tympanic membrane, external ear and ear canal normal.  Left Ear: Tympanic membrane, external ear and ear canal normal.  Nose: No rhinorrhea.  Mouth/Throat: Oropharynx is clear and moist and mucous membranes are normal. No oropharyngeal exudate or posterior oropharyngeal erythema.  L external ear: mass behind esr flesh colored with abraded area centrally and dried blood. No surrounding erythema. No lymphadenopathy.  Eyes: Pupils are equal, round, and reactive to light. Conjunctivae are normal.  Neck: Neck supple.  Cardiovascular: Normal rate, regular rhythm, normal heart sounds and intact distal pulses.  No murmur heard. Pulmonary/Chest: Effort normal and breath sounds normal. He has no wheezes. He has no rhonchi. He has no rales.  Abdominal: Soft. Bowel sounds are normal. He exhibits no distension. There is tenderness (minimal) in the epigastric area. There is no rebound, no guarding, no tenderness at McBurney's point and negative Murphy's sign.  Lymphadenopathy:    He has no cervical adenopathy.  Neurological: He is alert and oriented to person, place, and time.  Skin: Skin is warm and dry. No rash noted.  Psychiatric: He has a normal mood and affect. His behavior is normal.  Vitals reviewed.  Vitals:   03/23/18 1204  BP: 124/80  Pulse: 85  Temp: 98.4 F (36.9 C)  TempSrc: Oral  SpO2: 94%  Weight: 155 lb 9.6 oz (70.6 kg)  Height: _0  (1.778 m)      Assessment & Plan:    Gilbert [name] is a 67 y.o. male Acute upper respiratory infection  Nausea and vomiting, intractability of vomiting not specified, unspecified  vomiting type - Plan: ondansetron (ZOFRAN ODT) 4 MG disintegrating tablet  Hiccups  Cough - Plan: doxycycline (VIBRA-TABS) 100 MG tablet  Suspect viral illness with initial rhinorrhea, cough, congestion secondary nausea Gillespie have been from congestion and hiccups likely from vomiting.  He is tolerating oral hydration with water, no vomiting recently.  Diarrhea.  Appears overall well hydrated, reassuring vital signs.  Minimal epigastric tenderness likely from previous vomiting, abdominal exam otherwise reassuring.  -Initial symptomatic care with saline nasal spray for congestion, Mucinex for cough.  Zofran for nausea, potential side effects including constipation discussed.     -If hiccups persist in spite of treatment above, could consider Thorazine short-term.     -With his comorbidities I did write for doxycycline to cover for lower respiratory infection if symptoms are not improving next few days, potential side effects discussed as well as RTC precautions given.   I will check with referral to see if we can have him seen by ENT sooner for the left ear mass.  Meds ordered this encounter  Medications  . doxycycline (VIBRA-TABS) 100 MG tablet    Sig: Take 1 tablet (100 mg total) by mouth 2 (two) times daily.    Dispense:  20 tablet    Refill:  0  . ondansetron (ZOFRAN ODT) 4 MG disintegrating tablet    Sig: Take 1 tablet (4 mg total) by mouth every 8 (eight) hours as needed for nausea or vomiting.    Dispense:  10 tablet    Refill:  0   Patient Instructions   Runny nose and congestion, as well as cough are likely  due to a virus at this time.  The congestion Feagan have contributed to the upset stomach or nausea and vomiting.  Hiccups likely due to the vomiting.  Start with saline nasal spray over-the-counter for nasal congestion, Mucinex over-the-counter for cough as needed.  Small sips of fluids frequently.  Zofran if needed for nausea or vomiting, but if the symptoms persist or persistent  hiccups, return for recheck here or emergency room.  If you are unable to tolerate fluids, can be seen here in the emergency room.  If cough and congestion is not improving in the next few days, I did print an antibiotic but currently appears to be more due to a virus.  Return to the clinic or go to the nearest emergency room if any of your symptoms worsen or new symptoms occur. Hiccups A hiccup is the result of a sudden shortening of the muscle below your lungs (diaphragm). This movement of your diaphragm causes a sudden inhalation followed by the closing of your vocal cords, which causes the hiccup sound. Most people get the hiccups. Typically, hiccups last only a short amount of time. There are three types of hiccups:  Benign. These hiccups last less than 48 hours.  Persistent. These hiccups last more than 48 hours, but less than 1 month.  Intractable. These hiccups last more than 1 month.  A hiccup is a reflex. You cannot control reflexes. Follow these instructions at home: Watch your hiccups for any changes. The following actions Lech help to lessen any discomfort that you are feeling:  Eat small meals.  Limit alcohol intake to no more than 1 drink per day for nonpregnant women and 2 drinks per day for men. One drink equals 12 oz of beer, 5 oz of wine, or 1 oz of hard liquor.  Limit drinking carbonated or fizzy drinks, such as soda.  Eat and chew your food slowly.  Avoid eating or drinking hot or spicy foods and drinks.  Take medicines only as directed by your health care provider.  Contact a health care provider if:  Your hiccups last for more than 48 hours.  Your hiccups do not improve with treatment.  You cannot sleep or eat due to the hiccups.  You have unexpected weight loss due to the hiccups.  You have a fever.  You have trouble breathing or swallowing.  You develop severe pain in your abdomen.  You develop numbness, tingling, or weakness. This information  is not intended to replace advice given to you by your health care provider. Make sure you discuss any questions you have with your health care provider. Document Released: 09/27/2001 Document Revised: 12/25/2015 Document Reviewed: 07/15/2014 Elsevier Interactive Patient Education  2018 [employer].  Nausea and Vomiting, Adult Feeling sick to your stomach (nausea) means that your stomach is upset or you feel like you have to throw up (vomit). Feeling more and more sick to your stomach can lead to throwing up. Throwing up happens when food and liquid from your stomach are thrown up and out the mouth. Throwing up can make you feel weak and cause you to get dehydrated. Dehydration can make you tired and thirsty, make you have a dry mouth, and make it so you pee (urinate) less often. Older adults and people with other diseases or a weak defense system (immune system) are at higher risk for dehydration. If you feel sick to your stomach or if you throw up, it is important to follow instructions from your doctor about how  to take care of yourself. Follow these instructions at home: Eating and drinking Follow these instructions as told by your doctor:  Take an oral rehydration solution (ORS). This is a drink that is sold at pharmacies and stores.  Drink clear fluids in small amounts as you are able, such as: ? Water. ? Ice chips. ? Diluted fruit juice. ? Low-calorie sports drinks.  Eat bland, easy-to-digest foods in small amounts as you are able, such as: ? Bananas. ? Applesauce. ? Rice. ? Low-fat (lean) meats. ? Toast. ? Crackers.  Avoid fluids that have a lot of sugar or caffeine in them.  Avoid alcohol.  Avoid spicy or fatty foods.  General instructions  Drink enough fluid to keep your pee (urine) clear or pale yellow.  Wash your hands often. If you cannot use soap and water, use hand sanitizer.  Make sure that all people in your home wash their hands well and often.  Take  over-the-counter and prescription medicines only as told by your doctor.  Rest at home while you get better.  Watch your condition for any changes.  Breathe slowly and deeply when you feel sick to your stomach.  Keep all follow-up visits as told by your doctor. This is important. Contact a doctor if:  You have a fever.  You cannot keep fluids down.  Your symptoms get worse.  You have new symptoms.  You feel sick to your stomach for more than two days.  You feel light-headed or dizzy.  You have a headache.  You have muscle cramps. Get help right away if:  You have pain in your chest, neck, arm, or jaw.  You feel very weak or you pass out (faint).  You throw up again and again.  You see blood in your throw-up.  Your throw-up looks like black coffee grounds.  You have bloody or black poop (stools) or poop that look like tar.  You have a very bad headache, a stiff neck, or both.  You have a rash.  You have very bad pain, cramping, or bloating in your belly (abdomen).  You have trouble breathing.  You are breathing very quickly.  Your heart is beating very quickly.  Your skin feels cold and clammy.  You feel confused.  You have pain when you pee.  You have signs of dehydration, such as: ? Dark pee, hardly any pee, or no pee. ? Cracked lips. ? Dry mouth. ? Sunken eyes. ? Sleepiness. ? Weakness. These symptoms Roessner be an emergency. Do not wait to see if the symptoms will go away. Get medical help right away. Call your local emergency services (911 in the [place] Do not drive yourself to the hospital. This information is not intended to replace advice given to you by your health care provider. Make sure you discuss any questions you have with your health care provider. Document Released: 01/05/2008 Document Revised: 02/06/2016 Document Reviewed: 03/25/2015 Elsevier Interactive Patient Education  2018 [place].  Upper Respiratory Infection, Adult Most  upper respiratory infections (URIs) are caused by a virus. A URI affects the nose, throat, and upper air passages. The most common type of URI is often called "the common cold." Follow these instructions at home:  Take medicines only as told by your doctor.  Gargle warm saltwater or take cough drops to comfort your throat as told by your doctor.  Use a warm mist humidifier or inhale steam from a shower to increase air moisture. This Harren make it easier to  breathe.  Drink enough fluid to keep your pee (urine) clear or pale yellow.  Eat soups and other clear broths.  Have a healthy diet.  Rest as needed.  Go back to work when your fever is gone or your doctor says it is okay. ? You Scarbro need to stay home longer to avoid giving your URI to others. ? You can also wear a face mask and wash your hands often to prevent spread of the virus.  Use your inhaler more if you have asthma.  Do not use any tobacco products, including cigarettes, chewing tobacco, or electronic cigarettes. If you need help quitting, ask your doctor. Contact a doctor if:  You are getting worse, not better.  Your symptoms are not helped by medicine.  You have chills.  You are getting more short of breath.  You have brown or red mucus.  You have yellow or brown discharge from your nose.  You have pain in your face, especially when you bend forward.  You have a fever.  You have puffy (swollen) neck glands.  You have pain while swallowing.  You have white areas in the back of your throat. Get help right away if:  You have very bad or constant: ? Headache. ? Ear pain. ? Pain in your forehead, behind your eyes, and over your cheekbones (sinus pain). ? Chest pain.  You have long-lasting (chronic) lung disease and any of the following: ? Wheezing. ? Long-lasting cough. ? Coughing up blood. ? A change in your usual mucus.  You have a stiff neck.  You have changes in  your: ? Vision. ? Hearing. ? Thinking. ? Mood. This information is not intended to replace advice given to you by your health care provider. Make sure you discuss any questions you have with your health care provider. Document Released: 01/05/2008 Document Revised: 03/21/2016 Document Reviewed: 10/24/2013 Elsevier Interactive Patient Education  2018 [place].  Cough, Adult Coughing is a reflex that clears your throat and your airways. Coughing helps to heal and protect your lungs. It is normal to cough occasionally, but a cough that happens with other symptoms or lasts a long time Amundson be a sign of a condition that needs treatment. A cough Consiglio last only 2-3 weeks (acute), or it Belardo last longer than 8 weeks (chronic). What are the causes? Coughing is commonly caused by:  Breathing in substances that irritate your lungs.  A viral or bacterial respiratory infection.  Allergies.  Asthma.  Postnasal drip.  Smoking.  Acid backing up from the stomach into the esophagus (gastroesophageal reflux).  Certain medicines.  Chronic lung problems, including COPD (or rarely, lung cancer).  Other medical conditions such as heart failure.  Follow these instructions at home: Pay attention to any changes in your symptoms. Take these actions to help with your discomfort:  Take medicines only as told by your health care provider. ? If you were prescribed an antibiotic medicine, take it as told by your health care provider. Do not stop taking the antibiotic even if you start to feel better. ? Talk with your health care provider before you take a cough suppressant medicine.  Drink enough fluid to keep your urine clear or pale yellow.  If the air is dry, use a cold steam vaporizer or humidifier in your bedroom or your home to help loosen secretions.  Avoid anything that causes you to cough at work or at home.  If your cough is worse at night, try sleeping in  a semi-upright position.  Avoid  cigarette smoke. If you smoke, quit smoking. If you need help quitting, ask your health care provider.  Avoid caffeine.  Avoid alcohol.  Rest as needed.  Contact a health care provider if:  You have new symptoms.  You cough up pus.  Your cough does not get better after 2-3 weeks, or your cough gets worse.  You cannot control your cough with suppressant medicines and you are losing sleep.  You develop pain that is getting worse or pain that is not controlled with pain medicines.  You have a fever.  You have unexplained weight loss.  You have night sweats. Get help right away if:  You cough up blood.  You have difficulty breathing.  Your heartbeat is very fast. This information is not intended to replace advice given to you by your health care provider. Make sure you discuss any questions you have with your health care provider. Document Released: 01/15/2011 Document Revised: 12/25/2015 Document Reviewed: 09/25/2014 Elsevier Interactive Patient Education  [employer].   If you have lab work done today you will be contacted with your lab results within the next 2 weeks.  If you have not heard from [place] then please contact us. The fastest way to get your results is to register for My Chart.   IF you received an x-ray today, you will receive an invoice from [facility] Rad[facility]. Please contact [facility] at [phone] with questions or concerns regarding your invoice.   IF you received labwork today, you will receive an invoice from [place]. Please contact [facility] at [phone] with questions or concerns regarding your invoice.   Our billing staff will not be able to assist you with questions regarding bills from these companies.  You will be contacted with the lab results as soon as they are available. The fastest way to get your results is to activate your My Chart account. Instructions are located on the last page of this paperwork. If you have not  heard from [place] regarding the results in 2 weeks, please contact this office.       I personally performed the services described in this documentation, which was scribed in my presence. The recorded information has been reviewed and considered for accuracy and completeness, addended by me as needed, and agree with information above.  Signed,   [name], MD [facility] [place].  03/23/18 12:35 PM

## 2018-03-23 NOTE — Patient Instructions (Addendum)
Runny nose and congestion, as well as cough are likely due to a virus at this time.  The congestion Shanks have contributed to the upset stomach or nausea and vomiting.  Hiccups likely due to the vomiting.  Start with saline nasal spray over-the-counter for nasal congestion, Mucinex over-the-counter for cough as needed.  Small sips of fluids frequently.  Zofran if needed for nausea or vomiting, but if the symptoms persist or persistent hiccups, return for recheck here or emergency room.  If you are unable to tolerate fluids, can be seen here in the emergency room.  If cough and congestion is not improving in the next few days, I did print an antibiotic but currently appears to be more due to a virus.  Return to the clinic or go to the nearest emergency room if any of your symptoms worsen or new symptoms occur. Hiccups A hiccup is the result of a sudden shortening of the muscle below your lungs (diaphragm). This movement of your diaphragm causes a sudden inhalation followed by the closing of your vocal cords, which causes the hiccup sound. Most people get the hiccups. Typically, hiccups last only a short amount of time. There are three types of hiccups:  Benign. These hiccups last less than 48 hours.  Persistent. These hiccups last more than 48 hours, but less than 1 month.  Intractable. These hiccups last more than 1 month.  A hiccup is a reflex. You cannot control reflexes. Follow these instructions at home: Watch your hiccups for any changes. The following actions [place] help to lessen any discomfort that you are feeling:  Eat small meals.  Limit alcohol intake to no more than 1 drink per day for nonpregnant women and 2 drinks per day for men. One drink equals 12 oz of beer, 5 oz of wine, or 1 oz of hard liquor.  Limit drinking carbonated or fizzy drinks, such as soda.  Eat and chew your food slowly.  Avoid eating or drinking hot or spicy foods and drinks.  Take medicines only as directed  by your health care provider.  Contact a health care provider if:  Your hiccups last for more than 48 hours.  Your hiccups do not improve with treatment.  You cannot sleep or eat due to the hiccups.  You have unexpected weight loss due to the hiccups.  You have a fever.  You have trouble breathing or swallowing.  You develop severe pain in your abdomen.  You develop numbness, tingling, or weakness. This information is not intended to replace advice given to you by your health care provider. Make sure you discuss any questions you have with your health care provider. Document Released: 09/27/2001 Document Revised: 12/25/2015 Document Reviewed: 07/15/2014 Elsevier Interactive Patient Education  2018 [employer].  Nausea and Vomiting, Adult Feeling sick to your stomach (nausea) means that your stomach is upset or you feel like you have to throw up (vomit). Feeling more and more sick to your stomach can lead to throwing up. Throwing up happens when food and liquid from your stomach are thrown up and out the mouth. Throwing up can make you feel weak and cause you to get dehydrated. Dehydration can make you tired and thirsty, make you have a dry mouth, and make it so you pee (urinate) less often. Older adults and people with other diseases or a weak defense system (immune system) are at higher risk for dehydration. If you feel sick to your stomach or if you throw up, it is  important to follow instructions from your doctor about how to take care of yourself. Follow these instructions at home: Eating and drinking Follow these instructions as told by your doctor:  Take an oral rehydration solution (ORS). This is a drink that is sold at pharmacies and stores.  Drink clear fluids in small amounts as you are able, such as: ? Water. ? Ice chips. ? Diluted fruit juice. ? Low-calorie sports drinks.  Eat bland, easy-to-digest foods in small amounts as you are able, such  as: ? Bananas. ? Applesauce. ? Rice. ? Low-fat (lean) meats. ? Toast. ? Crackers.  Avoid fluids that have a lot of sugar or caffeine in them.  Avoid alcohol.  Avoid spicy or fatty foods.  General instructions  Drink enough fluid to keep your pee (urine) clear or pale yellow.  Wash your hands often. If you cannot use soap and water, use hand sanitizer.  Make sure that all people in your home wash their hands well and often.  Take over-the-counter and prescription medicines only as told by your doctor.  Rest at home while you get better.  Watch your condition for any changes.  Breathe slowly and deeply when you feel sick to your stomach.  Keep all follow-up visits as told by your doctor. This is important. Contact a doctor if:  You have a fever.  You cannot keep fluids down.  Your symptoms get worse.  You have new symptoms.  You feel sick to your stomach for more than two days.  You feel light-headed or dizzy.  You have a headache.  You have muscle cramps. Get help right away if:  You have pain in your chest, neck, arm, or jaw.  You feel very weak or you pass out (faint).  You throw up again and again.  You see blood in your throw-up.  Your throw-up looks like black coffee grounds.  You have bloody or black poop (stools) or poop that look like tar.  You have a very bad headache, a stiff neck, or both.  You have a rash.  You have very bad pain, cramping, or bloating in your belly (abdomen).  You have trouble breathing.  You are breathing very quickly.  Your heart is beating very quickly.  Your skin feels cold and clammy.  You feel confused.  You have pain when you pee.  You have signs of dehydration, such as: ? Dark pee, hardly any pee, or no pee. ? Cracked lips. ? Dry mouth. ? Sunken eyes. ? Sleepiness. ? Weakness. These symptoms Mysliwiec be an emergency. Do not wait to see if the symptoms will go away. Get medical help right away. Call  your local emergency services (911 in the [place] Do not drive yourself to the hospital. This information is not intended to replace advice given to you by your health care provider. Make sure you discuss any questions you have with your health care provider. Document Released: 01/05/2008 Document Revised: 02/06/2016 Document Reviewed: 03/25/2015 Elsevier Interactive Patient Education  2018 [employer].  Upper Respiratory Infection, Adult Most upper respiratory infections (URIs) are caused by a virus. A URI affects the nose, throat, and upper air passages. The most common type of URI is often called "the common cold." Follow these instructions at home:  Take medicines only as told by your doctor.  Gargle warm saltwater or take cough drops to comfort your throat as told by your doctor.  Use a warm mist humidifier or inhale steam from a shower to  increase air moisture. This Cast[place] make it easier to breathe.  Drink enough fluid to keep your pee (urine) clear or pale yellow.  Eat soups and other clear broths.  Have a healthy diet.  Rest as needed.  Go back to work when your fever is gone or your doctor says it is okay. ? You Nisley need to stay home longer to avoid giving your URI to others. ? You can also wear a face mask and wash your hands often to prevent spread of the virus.  Use your inhaler more if you have asthma.  Do not use any tobacco products, including cigarettes, chewing tobacco, or electronic cigarettes. If you need help quitting, ask your doctor. Contact a doctor if:  You are getting worse, not better.  Your symptoms are not helped by medicine.  You have chills.  You are getting more short of breath.  You have brown or red mucus.  You have yellow or brown discharge from your nose.  You have pain in your face, especially when you bend forward.  You have a fever.  You have puffy (swollen) neck glands.  You have pain while swallowing.  You have white areas in  the back of your throat. Get help right away if:  You have very bad or constant: ? Headache. ? Ear pain. ? Pain in your forehead, behind your eyes, and over your cheekbones (sinus pain). ? Chest pain.  You have long-lasting (chronic) lung disease and any of the following: ? Wheezing. ? Long-lasting cough. ? Coughing up blood. ? A change in your usual mucus.  You have a stiff neck.  You have changes in your: ? Vision. ? Hearing. ? Thinking. ? Mood. This information is not intended to replace advice given to you by your health care provider. Make sure you discuss any questions you have with your health care provider. Document Released: 01/05/2008 Document Revised: 03/21/2016 Document Reviewed: 10/24/2013 Elsevier Interactive Patient Education  2018 [place].  Cough, Adult Coughing is a reflex that clears your throat and your airways. Coughing helps to heal and protect your lungs. It is normal to cough occasionally, but a cough that happens with other symptoms or lasts a long time Penalver be a sign of a condition that needs treatment. A cough Ohair last only 2-3 weeks (acute), or it Guzek last longer than 8 weeks (chronic). What are the causes? Coughing is commonly caused by:  Breathing in substances that irritate your lungs.  A viral or bacterial respiratory infection.  Allergies.  Asthma.  Postnasal drip.  Smoking.  Acid backing up from the stomach into the esophagus (gastroesophageal reflux).  Certain medicines.  Chronic lung problems, including COPD (or rarely, lung cancer).  Other medical conditions such as heart failure.  Follow these instructions at home: Pay attention to any changes in your symptoms. Take these actions to help with your discomfort:  Take medicines only as told by your health care provider. ? If you were prescribed an antibiotic medicine, take it as told by your health care provider. Do not stop taking the antibiotic even if you start to feel  better. ? Talk with your health care provider before you take a cough suppressant medicine.  Drink enough fluid to keep your urine clear or pale yellow.  If the air is dry, use a cold steam vaporizer or humidifier in your bedroom or your home to help loosen secretions.  Avoid anything that causes you to cough at work or at home.  If your cough is worse at night, try sleeping in a semi-upright position.  Avoid cigarette smoke. If you smoke, quit smoking. If you need help quitting, ask your health care provider.  Avoid caffeine.  Avoid alcohol.  Rest as needed.  Contact a health care provider if:  You have new symptoms.  You cough up pus.  Your cough does not get better after 2-3 weeks, or your cough gets worse.  You cannot control your cough with suppressant medicines and you are losing sleep.  You develop pain that is getting worse or pain that is not controlled with pain medicines.  You have a fever.  You have unexplained weight loss.  You have night sweats. Get help right away if:  You cough up blood.  You have difficulty breathing.  Your heartbeat is very fast. This information is not intended to replace advice given to you by your health care provider. Make sure you discuss any questions you have with your health care provider. Document Released: 01/15/2011 Document Revised: 12/25/2015 Document Reviewed: 09/25/2014 Elsevier Interactive Patient Education  [employer].   If you have lab work done today you will be contacted with your lab results within the next 2 weeks.  If you have not heard from [place] then please contact us. The fastest way to get your results is to register for My Chart.   IF you received an x-ray today, you will receive an invoice from [facility] Radiology. Please contact [facility] at [phone] with questions or concerns regarding your invoice.   IF you received labwork today, you will receive an invoice from [place].  Please contact [facility] at [phone] with questions or concerns regarding your invoice.   Our billing staff will not be able to assist you with questions regarding bills from these companies.  You will be contacted with the lab results as soon as they are available. The fastest way to get your results is to activate your My Chart account. Instructions are located on the last page of this paperwork. If you have not heard from [place] regarding the results in 2 weeks, please contact this office.

## 2018-04-18 ENCOUNTER — Inpatient Hospital Stay: Payer: Medicare Other | Attending: Hematology & Oncology

## 2018-04-18 VITALS — BP 138/66 | HR 58 | Temp 97.9°F | Resp 16

## 2018-04-18 DIAGNOSIS — Z85528 Personal history of other malignant neoplasm of kidney: Secondary | ICD-10-CM | POA: Diagnosis not present

## 2018-04-18 DIAGNOSIS — Z905 Acquired absence of kidney: Secondary | ICD-10-CM | POA: Insufficient documentation

## 2018-04-18 DIAGNOSIS — Z9221 Personal history of antineoplastic chemotherapy: Secondary | ICD-10-CM | POA: Diagnosis not present

## 2018-04-18 DIAGNOSIS — Z452 Encounter for adjustment and management of vascular access device: Secondary | ICD-10-CM | POA: Insufficient documentation

## 2018-04-18 DIAGNOSIS — Z8572 Personal history of non-Hodgkin lymphomas: Secondary | ICD-10-CM | POA: Insufficient documentation

## 2018-04-18 DIAGNOSIS — Z95828 Presence of other vascular implants and grafts: Secondary | ICD-10-CM

## 2018-04-18 MED ORDER — SODIUM CHLORIDE 0.9% FLUSH
10.0000 mL | INTRAVENOUS | Status: DC | PRN
  Administered 2018-04-18: 10 mL via INTRAVENOUS
  Filled 2018-04-18: qty 10

## 2018-04-18 MED ORDER — HEPARIN SOD (PORK) LOCK FLUSH 100 UNIT/ML IV SOLN
500.0000 [IU] | Freq: Once | INTRAVENOUS | Status: AC
  Administered 2018-04-18: 500 [IU] via INTRAVENOUS
  Filled 2018-04-18: qty 5

## 2018-04-18 NOTE — Patient Instructions (Signed)
Implanted Port Insertion, Care After °This sheet gives you information about how to care for yourself after your procedure. Your health care provider Frechette also give you more specific instructions. If you have problems or questions, contact your health care provider. °What can I expect after the procedure? °After your procedure, it is common to have: °· Discomfort at the port insertion site. °· Bruising on the skin over the port. This should improve over 3-4 days. ° °Follow these instructions at home: °Port care °· After your port is placed, you will get a manufacturer's information card. The card has information about your port. Keep this card with you at all times. °· Take care of the port as told by your health care provider. Ask your health care provider if you or a family member can get training for taking care of the port at home. A home health care nurse Suchan also take care of the port. °· Make sure to remember what type of port you have. °Incision care °· Follow instructions from your health care provider about how to take care of your port insertion site. Make sure you: °? Wash your hands with soap and water before you change your bandage (dressing). If soap and water are not available, use hand sanitizer. °? Change your dressing as told by your health care provider. °? Leave stitches (sutures), skin glue, or adhesive strips in place. These skin closures [place] need to stay in place for 2 weeks or longer. If adhesive strip edges start to loosen and curl up, you Helfand trim the loose edges. Do not remove adhesive strips completely unless your health care provider tells you to do that. °· Check your port insertion site every day for signs of infection. Check for: °? More redness, swelling, or pain. °? More fluid or blood. °? Warmth. °? Pus or a bad smell. °General instructions °· Do not take baths, swim, or use a hot tub until your health care provider approves. °· Do not lift anything that is heavier than 10 lb (4.5  kg) for a week, or as told by your health care provider. °· Ask your health care provider when it is okay to: °? Return to work or school. °? Resume usual physical activities or sports. °· Do not drive for 24 hours if you were given a medicine to help you relax (sedative). °· Take over-the-counter and prescription medicines only as told by your health care provider. °· Wear a medical alert bracelet in case of an emergency. This will tell any health care providers that you have a port. °· Keep all follow-up visits as told by your health care provider. This is important. °Contact a health care provider if: °· You cannot flush your port with saline as directed, or you cannot draw blood from the port. °· You have a fever or chills. °· You have more redness, swelling, or pain around your port insertion site. °· You have more fluid or blood coming from your port insertion site. °· Your port insertion site feels warm to the touch. °· You have pus or a bad smell coming from the port insertion site. °Get help right away if: °· You have chest pain or shortness of breath. °· You have bleeding from your port that you cannot control. °Summary °· Take care of the port as told by your health care provider. °· Change your dressing as told by your health care provider. °· Keep all follow-up visits as told by your health care provider. °  This information is not intended to replace advice given to you by your health care provider. Make sure you discuss any questions you have with your health care provider. °Document Released: 05/09/2013 Document Revised: 06/09/2016 Document Reviewed: 06/09/2016 °Elsevier Interactive Patient Education © 2017 Elsevier Inc. ° °

## 2018-04-19 ENCOUNTER — Other Ambulatory Visit (INDEPENDENT_AMBULATORY_CARE_PROVIDER_SITE_OTHER): Payer: Self-pay | Admitting: Otolaryngology

## 2018-04-27 ENCOUNTER — Other Ambulatory Visit: Payer: Self-pay | Admitting: Otolaryngology

## 2018-05-03 NOTE — Progress Notes (Signed)
Patient has Hx CAD with stent 2015, CHF, renal cancer, non-Hodgkins Lymphoma. His last ECHO 06-02-17 revealed an EF of 15-20%. Per [facility] guidelines, Patient will be better served at [place]. Heather at Dr Deeann Saint office notified of need to move patient.

## 2018-05-11 ENCOUNTER — Other Ambulatory Visit: Payer: Medicare Other

## 2018-05-11 ENCOUNTER — Ambulatory Visit: Payer: Medicare Other

## 2018-05-11 ENCOUNTER — Ambulatory Visit: Payer: Medicare Other | Admitting: Hematology & Oncology

## 2018-05-22 NOTE — Pre-Procedure Instructions (Addendum)
Kasra Pounders  05/22/2018      [facility] ([facility] [place] [address] [place]) [place] [address] Phone: [phone] Fax: [phone]    Your procedure is scheduled on Wednesday October 30th.  Report to [facility] Admitting at 8:00 A.M.  Call this number if you have problems the morning of surgery:  [phone]   Remember:  Do not eat or drink after midnight.      Take these medicines the morning of surgery with A SIP OF WATER  carvedilol (COREG) 12.5 MG tablet carvedilol (COREG) 3.125 MG tablet acetaminophen (TYLENOL) 500 MG tablet phenytoin (DILANTIN) 300 MG ER capsule ondansetron (ZOFRAN ODT) 4 MG disintegrating tablet- if needed ranitidine (ZANTAC) 150 MG tablet- if needed   Follow your surgeon's instructions on when to stop/resume Plavix.   7 days prior to surgery STOP taking any Aspirin(unless otherwise instructed by your surgeon), Aleve, Naproxen, Ibuprofen, Motrin, Advil, Goody's, BC's, all herbal medications, fish oil, and all vitamins    Do not wear jewelry, make-up or nail polish.  Do not wear lotions, powders, or perfumes, or deodorant.  Do not shave 48 hours prior to surgery.  Men Pisani shave face and neck.  Do not bring valuables to the hospital.  [facility] is not responsible for any belongings or valuables.  Contacts, eyeglasses, hearing airs, dentures or bridgework Haydon not be worn into surgery.  Leave your suitcase in the car.  After surgery it Ealey be brought to your room.  For patients admitted to the hospital, discharge time will be determined by your treatment team.  Patients discharged the day of surgery will not be allowed to drive home.   [facility]- Preparing For Surgery  Before surgery, you can play an important role. Because skin is not sterile, your skin needs to be as free of germs as possible. You can reduce the number of germs on your skin by washing with CHG (chlorahexidine  gluconate) Soap before surgery.  CHG is an antiseptic cleaner which kills germs and bonds with the skin to continue killing germs even after washing.    Oral Hygiene is also important to reduce your risk of infection.  Remember - BRUSH YOUR TEETH THE MORNING OF SURGERY WITH YOUR REGULAR TOOTHPASTE  Please do not use if you have an allergy to CHG or antibacterial soaps. If your skin becomes reddened/irritated stop using the CHG.  Do not shave (including legs and underarms) for at least 48 hours prior to first CHG shower. It is OK to shave your face.  Please follow these instructions carefully.   1. Shower the NIGHT BEFORE SURGERY and the MORNING OF SURGERY with CHG.   2. If you chose to wash your hair, wash your hair first as usual with your normal shampoo.  3. After you shampoo, rinse your hair and body thoroughly to remove the shampoo.  4. Use CHG as you would any other liquid soap. You can apply CHG directly to the skin and wash gently with a scrungie or a clean washcloth.   5. Apply the CHG Soap to your body ONLY FROM THE NECK DOWN.  Do not use on open wounds or open sores. Avoid contact with your eyes, ears, mouth and genitals (private parts). Wash Face and genitals (private parts)  with your normal soap.  6. Wash thoroughly, paying special attention to the area where your surgery will be performed.  7. Thoroughly rinse your body  with warm water from the neck down.  8. DO NOT shower/wash with your normal soap after using and rinsing off the CHG Soap.  9. Pat yourself dry with a CLEAN TOWEL.  10. Wear CLEAN PAJAMAS to bed the night before surgery, wear comfortable clothes the morning of surgery  11. Place CLEAN SHEETS on your bed the night of your first shower and DO NOT SLEEP WITH PETS.    Day of Surgery: Shower as stated above. Do not apply any deodorants/lotions.  Please wear clean clothes to the hospital/surgery center.   Remember to brush your teeth WITH YOUR REGULAR  TOOTHPASTE.   Please read over the following fact sheets that you were given.

## 2018-05-23 ENCOUNTER — Encounter (HOSPITAL_COMMUNITY): Payer: Self-pay

## 2018-05-23 ENCOUNTER — Other Ambulatory Visit: Payer: Self-pay

## 2018-05-23 ENCOUNTER — Encounter (HOSPITAL_COMMUNITY)
Admission: RE | Admit: 2018-05-23 | Discharge: 2018-05-23 | Disposition: A | Discharge status: Home / Self Care | Payer: Medicare Other | Source: Ambulatory Visit | Attending: Otolaryngology | Admitting: Otolaryngology

## 2018-05-23 DIAGNOSIS — E7800 Pure hypercholesterolemia, unspecified: Secondary | ICD-10-CM | POA: Insufficient documentation

## 2018-05-23 DIAGNOSIS — Z905 Acquired absence of kidney: Secondary | ICD-10-CM | POA: Insufficient documentation

## 2018-05-23 DIAGNOSIS — C44209 Unspecified malignant neoplasm of skin of left ear and external auricular canal: Secondary | ICD-10-CM | POA: Diagnosis not present

## 2018-05-23 DIAGNOSIS — Z01812 Encounter for preprocedural laboratory examination: Secondary | ICD-10-CM | POA: Diagnosis present

## 2018-05-23 DIAGNOSIS — R590 Localized enlarged lymph nodes: Secondary | ICD-10-CM | POA: Diagnosis not present

## 2018-05-23 DIAGNOSIS — C642 Malignant neoplasm of left kidney, except renal pelvis: Secondary | ICD-10-CM | POA: Insufficient documentation

## 2018-05-23 DIAGNOSIS — Z955 Presence of coronary angioplasty implant and graft: Secondary | ICD-10-CM | POA: Diagnosis not present

## 2018-05-23 DIAGNOSIS — E785 Hyperlipidemia, unspecified: Secondary | ICD-10-CM | POA: Diagnosis not present

## 2018-05-23 DIAGNOSIS — I252 Old myocardial infarction: Secondary | ICD-10-CM | POA: Insufficient documentation

## 2018-05-23 DIAGNOSIS — R918 Other nonspecific abnormal finding of lung field: Secondary | ICD-10-CM | POA: Insufficient documentation

## 2018-05-23 DIAGNOSIS — I2510 Atherosclerotic heart disease of native coronary artery without angina pectoris: Secondary | ICD-10-CM | POA: Diagnosis not present

## 2018-05-23 DIAGNOSIS — I10 Essential (primary) hypertension: Secondary | ICD-10-CM | POA: Diagnosis not present

## 2018-05-23 DIAGNOSIS — Z79899 Other long term (current) drug therapy: Secondary | ICD-10-CM | POA: Insufficient documentation

## 2018-05-23 LAB — COMPREHENSIVE METABOLIC PANEL
ALBUMIN: 3.5 g/dL (ref 3.5–5.0)
ALK PHOS: 118 U/L (ref 38–126)
ALT: 18 U/L (ref 0–44)
ANION GAP: 7 (ref 5–15)
AST: 46 U/L — ABNORMAL HIGH (ref 15–41)
BILIRUBIN TOTAL: 1.2 mg/dL (ref 0.3–1.2)
BUN: 14 mg/dL (ref 8–23)
CALCIUM: 9.7 mg/dL (ref 8.9–10.3)
CO2: 26 mmol/L (ref 22–32)
Chloride: 109 mmol/L (ref 98–111)
Creatinine, Ser: 1.35 mg/dL — ABNORMAL HIGH (ref 0.61–1.24)
GFR calc non Af Amer: 53 mL/min — ABNORMAL LOW (ref >60)
GLUCOSE: 96 mg/dL (ref 70–99)
Potassium: 4.2 mmol/L (ref 3.5–5.1)
Sodium: 142 mmol/L (ref 135–145)
TOTAL PROTEIN: 6.3 g/dL — AB (ref 6.5–8.1)

## 2018-05-23 LAB — CBC
HCT: 41.1 % (ref 39.0–52.0)
HEMOGLOBIN: 13.1 g/dL (ref 13.0–17.0)
MCH: 30.1 pg (ref 26.0–34.0)
MCHC: 31.9 g/dL (ref 30.0–36.0)
MCV: 94.5 fL (ref 80.0–100.0)
NRBC: 0 % (ref 0.0–0.2)
PLATELETS: 152 10*3/uL (ref 150–400)
RBC: 4.35 MIL/uL (ref 4.22–5.81)
RDW: 13.4 % (ref 11.5–15.5)
WBC: 11.5 10*3/uL — AB (ref 4.0–10.5)

## 2018-05-23 NOTE — Progress Notes (Addendum)
PCP - [name] Cardiologist - Dr Einar Gip- last office note requested, per daughter, pt did get clearance from Dr. Einar Gip  Chest x-ray - 06/01/18 EKG - 06/01/18 Stress Test - 06/10/17 ECHO - 06/02/17 Cardiac Cath - 06/10/15  Blood Thinner Instructions: Pt daughter to call Dr. Deeann Saint and/or Dr. Irven Shelling office regarding stopping Plavix.   Anesthesia review: hx heart failure, CAD, c/o cough today- started 2 days ago, no chest pain, no sputum and no fevers per patient.   Patient denies shortness of breath, fever, cough and chest pain at PAT appointment   Patient verbalized understanding of instructions that were given to them at the PAT appointment. Patient was also instructed that they will need to review over the PAT instructions again at home before surgery.

## 2018-05-23 NOTE — Pre-Procedure Instructions (Signed)
Gilbert [name]  05/23/2018      [facility] ([place]), [place] [address] [place] ([place])  [address] Phone: [phone] Fax: [phone]    Your procedure is scheduled on Wednesday October 30th.  Report to [facility] Admitting at 8:00 A.M.  Call this number if you have problems the morning of surgery:  [phone]   Remember:  Do not eat or drink after midnight.      Take these medicines the morning of surgery with A SIP OF WATER:   carvedilol (COREG)  acetaminophen (TYLENOL) if needed phenytoin (DILANTIN)  ondansetron (ZOFRAN ODT) disintegrating tablet- if needed ranitidine (ZANTAC)- if needed  Follow your surgeon's instructions on when to stop/resume Plavix.   7 days prior to surgery STOP taking any Aspirin(unless otherwise instructed by your surgeon), Aleve, Naproxen, Ibuprofen, Motrin, Advil, Goody's, BC's, all herbal medications, fish oil, and all vitamins    Do not wear jewelry, make-up or nail polish.  Do not wear lotions, powders, or perfumes, or deodorant.  Do not shave 48 hours prior to surgery.  Men Dignan shave face and neck.  Do not bring valuables to the hospital.  [facility] is not responsible for any belongings or valuables.  Contacts, eyeglasses, hearing airs, dentures or bridgework Horwitz not be worn into surgery.  Leave your suitcase in the car.  After surgery it Sch[place]eter be brought to your room.  For patients admitted to the hospital, discharge time will be determined by your treatment team.  Patients discharged the day of surgery will not be allowed to drive home.   [place]- Preparing For Surgery  Before surgery, you can play an important role. Because skin is not sterile, your skin needs to be as free of germs as possible. You can reduce the number of germs on your skin by washing with CHG (chlorahexidine gluconate) Soap before surgery.  CHG is an antiseptic cleaner which kills germs and bonds  with the skin to continue killing germs even after washing.    Oral Hygiene is also important to reduce your risk of infection.  Remember - BRUSH YOUR TEETH THE MORNING OF SURGERY WITH YOUR REGULAR TOOTHPASTE  Please do not use if you have an allergy to CHG or antibacterial soaps. If your skin becomes reddened/irritated stop using the CHG.  Do not shave (including legs and underarms) for at least 48 hours prior to first CHG shower. It is OK to shave your face.  Please follow these instructions carefully.   1. Shower the NIGHT BEFORE SURGERY and the MORNING OF SURGERY with CHG.   2. If you chose to wash your hair, wash your hair first as usual with your normal shampoo.  3. After you shampoo, rinse your hair and body thoroughly to remove the shampoo.  4. Use CHG as you would any other liquid soap. You can apply CHG directly to the skin and wash gently with a scrungie or a clean washcloth.   5. Apply the CHG Soap to your body ONLY FROM THE NECK DOWN.  Do not use on open wounds or open sores. Avoid contact with your eyes, ears, mouth and genitals (private parts). Wash Face and genitals (private parts)  with your normal soap.  6. Wash thoroughly, paying special attention to the area where your surgery will be performed.  7. Thoroughly rinse your body with warm water from the neck down.  8. DO NOT shower/wash with your normal soap  after using and rinsing off the CHG Soap.  9. Pat yourself dry with a CLEAN TOWEL.  10. Wear CLEAN PAJAMAS to bed the night before surgery, wear comfortable clothes the morning of surgery  11. Place CLEAN SHEETS on your bed the night of your first shower and DO NOT SLEEP WITH PETS.    Day of Surgery: Shower as stated above. Do not apply any deodorants/lotions.  Please wear clean clothes to the hospital/surgery center.   Remember to brush your teeth WITH YOUR REGULAR TOOTHPASTE.   Please read over the following fact sheets that you were given.

## 2018-05-24 NOTE — Progress Notes (Signed)
Anesthesia Chart Review:  Case:  [id number] Date/Time:  05/31/18 0945   Procedures:      EXCISION EAR TUMOR (Left )     SKIN GRAFT FULL THICKNESS (Left )   Anesthesia type:  General   Pre-op diagnosis:  LEFT EAR CANCER   Location:  [facility] OR ROOM 09 / [facility]   Surgeon:  Leta Baptist, MD      DISCUSSION: 67 yo male former smoker. Pertinent hx includes CAD status post MI with left main stenting 2015, ischemic cardiomyopathy with recovery in EF to 55%, PAD status post left external iliac/common femoral stenting 2015, Renal insufficiency status post left nephrectomy for renal cell carcinoma, history of non-Hodgkin's lymphoma currently in remission R-CEOP completed on 06/30/2017.  Per Dr. Bonney Roussel OV note 04/26/2018: " Stable. No angina symptoms.  Patient was previously on long-term DAPT due to complex CAD.  To reduce bleeding risk we have stopped his aspirin, and continued Plavix.  Plavix can be stopped for any upcoming ear surgery..I will see him back in 6 months."  Cleared to stop Plavix by Dr. Virgina Jock, but does not have instruction from Dr. Deeann Saint office.  I saw pt at his PAT appt to evaluate mild cough. He was treated for URI by PCP 03/23/2018 and did recover from that episode. He reports his current cough started 2 days ago, no chest pain, no sputum and no fevers per patient. He denies SOB. Lung sounds are clear to auscultation. He does not appear ill. Anticipate if his condition stays stable or improves he can proceed with surgery as planned. He was advised to be seen by PCP if his cough worsens or if he develops any signs of systemic illness, and to let Dr. Deeann Saint office know if this happens.  I called Dr. Deeann Saint office regarding stopping Plavix and also advised that they should get an update on his cough when they call him with instructions.  VS: BP (!) 164/76   Pulse 79   Temp 36.6 C   Resp 18   Ht 5\' 10"  (1.778 m)   Wt 74.4 kg   SpO2 100%   BMI 23.55 kg/m   PROVIDERS: Wendie Agreste, MD is PCP  Vernell Leep, MD is Cardiologist last seen 04/26/2018  Burney Gauze, MD is Oncologist  Pearson Grippe, MD is Nephrologist  LABS: Labs reviewed: Acceptable for surgery. Mildly elevated creatinine c/w past results in setting of Left nephrectomy. (all labs ordered are listed, but only abnormal results are displayed)  Labs Reviewed  COMPREHENSIVE METABOLIC PANEL - Abnormal; Notable for the following components:      Result Value   Creatinine, Ser 1.35 (*)    Total Protein 6.3 (*)    AST 46 (*)    GFR calc non Af Amer 53 (*)    All other components within normal limits  CBC - Abnormal; Notable for the following components:   WBC 11.5 (*)    All other components within normal limits     IMAGES: CT Chest 12/10/2015: IMPRESSION: 1. Several (at least 7) new irregular pulmonary nodules in both lungs, predominantly in the lower lobes, suspicious for pulmonary metastases. 2. Stable moderate right hilar adenopathy. 3. Stable sclerotic left glenoid, mixed lytic and sclerotic right sixth rib and lytic T2 osseous lesions, bone metastases not excluded. 4. Mild superior T11 vertebral compression fracture, new since 10/21/2015, either acute or subacute. 5. Healing subacute nondisplaced right lateral fifth rib fracture. 6. Left main and 3 vessel coronary atherosclerosis. 7. Mild  centrilobular emphysema. These results will be called to the ordering clinician or representative by the Radiologist Assistant, and communication documented in the PACS or zVision Dashboard. EKG: 06/01/2018: Sinus rhythm with occasional Premature ventricular complexes. Left ventricular hypertrophy with repolarization abnormality. Cannot rule out Septal infarct , age undetermined.  CV: TTE 10/11/2017 (done at Dr. Bonney Roussel office, results in [place] note on pt chart): Left ventricle cavity is normal size.  Moderate concentric hypertrophy of the left ventricle.  Low normal LV systolic function.   Visual EF is 50 to 55%.  Doppler evidence of grade 1 diastolic dysfunction, indeterminate LAP.  Left ventricle regional wall motion findings: No wall motion abnormalities, calculated EF 46%.  Mild tricuspid regurgitation.  Borderline pulmonary hypertension, PA pressure estimated at 31 mmHg.  Compared to study done 03/30/2017, LVEF is improved from 35% to the present 50 to 55%.  Compared to hospital echo 06/02/2017, EF improved from 20%.  RV function was previously mildly reduced and small pericardial effusion and left pleural effusion not present.  Carotid US 01/27/2018 (results in OV note on pt chart): Stenosis in the right common carotid artery and right external carotid artery less than 50%.  Stenosis in the left internal carotid artery 50 to 69%.  Stenosis in the left external carotid artery less than 50%.  Antegrade right vertebral artery flow.  Antegrade left vertebral artery flow.  Overall no significant change compared to study done 07/28/2017.  Follow-up in 6 months.  Past Medical History:  Diagnosis Date  . Abnormal liver enzymes    Chronic alkaline phosphatase elevation since 2014  . Acid reflux   . CAD (coronary artery disease)   . Cancer ([place]) 08/2013   right kidney cancer  . Cancer ([place])   . Coronary artery calcification seen on CAT scan   . Coronary atherosclerosis of native coronary artery   . 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 Essential hypertension, benign   . Family history of anesthesia complication    mother-had stroke during anesthesia  . GERD (gastroesophageal reflux disease)   . Gout   . History of cardiac arrest 11/26/13   PTCA/Stenting of LM & Prox LAD with 4.0 x 18 mm Xience alpine stent. Used Impella Circulatory assist device. EF= 20-25%  . History of epilepsy    at least 10 years ago. 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 History of myocardial infarction less than 8 weeks    Stent  placed  . History of nephrectomy 11/2013   For renal cell carcinoma  . History of tobacco use   . HTN (hypertension)   . Hypercholesteremia   . Hyperlipemia   . Hypertension   . PAD (peripheral artery disease) ([place])   . Seizures ([place])   . Shortness of breath    with activity  . SOB (shortness of breath)   . Systolic and diastolic CHF, chronic (HCC)    Resolved. EF 45% 04/10/2014  . Therapeutic drug monitoring     Past Surgical History:  Procedure Laterality Date  . BALLOON DILATION N/A 01/21/2015   Procedure: BALLOON DILATION;  Surgeon: Gatha Mayer, MD;  Location: [facility] ENDOSCOPY;  Service: Endoscopy;  Laterality: N/A;  . CARDIAC CATHETERIZATION N/A 06/10/2015   Procedure: Left Heart Cath and Coronary Angiography;  Surgeon: Adrian Prows, MD;  Location: [facility] CV LAB;  Service: Cardiovascular;  Laterality: N/A;  . CORONARY ANGIOPLASTY WITH STENT PLACEMENT  2015   Left main coronary artery  stenting on emergent basis along with circulatory support with Impella device through left leg. With 4.0 x 18 mm Xience alpine stent  . ESOPHAGOGASTRODUODENOSCOPY    . ESOPHAGOGASTRODUODENOSCOPY N/A 01/21/2015   Procedure: ESOPHAGOGASTRODUODENOSCOPY (EGD) with possible dilation.;  Surgeon: Gatha Mayer, MD;  Location: [facility] ENDOSCOPY;  Service: Endoscopy;  Laterality: N/A;  . EYE SURGERY Left 2 weeks ago   torn retina  . ILIAC ARTERY STENT Left 05/28/2014   dr Einar Gip  . LEFT HEART CATHETERIZATION WITH CORONARY ANGIOGRAM N/A 11/27/2013   Procedure: LEFT HEART CATHETERIZATION WITH CORONARY ANGIOGRAM;  Surgeon: [name], MD;  Location: [facility] CATH LAB;  Service: Cardiovascular;  Laterality: N/A;  . LOWER EXTREMITY ANGIOGRAM N/A 02/26/2014   Procedure: LOWER EXTREMITY ANGIOGRAM;  Surgeon: [name], MD;  Location: [facility] CATH LAB;  Service: Cardiovascular;  Laterality: N/A;  . LOWER EXTREMITY ANGIOGRAM N/A 05/28/2014   Procedure: LOWER EXTREMITY ANGIOGRAM;  Surgeon: [name], MD;   Location: [facility] CATH LAB;  Service: Cardiovascular;  Laterality: N/A;  . NEPHRECTOMY  11/2013  . NO PAST SURGERIES    . PERCUTANEOUS CORONARY STENT INTERVENTION (PCI-S)  11/27/2013   Procedure: PERCUTANEOUS CORONARY STENT INTERVENTION (PCI-S);  Surgeon: [name], MD;  Location: [facility] CATH LAB;  Service: Cardiovascular;;  . ROBOT ASSISTED LAPAROSCOPIC NEPHRECTOMY Right 10/19/2013   Procedure: ROBOTIC ASSISTED LAPAROSCOPIC NEPHRECTOMY,  EXTENSIVE ADHESIOLYSIS;  Surgeon: Alexis Frock, MD;  Location: [facility] ORS;  Service: Urology;  Laterality: Right;    MEDICATIONS: . acetaminophen (TYLENOL) 500 MG tablet  . atorvastatin (LIPITOR) 80 MG tablet  . calcium carbonate (TUMS - DOSED IN MG ELEMENTAL CALCIUM) 500 MG chewable tablet  . carvedilol (COREG) 12.5 MG tablet  . carvedilol (COREG) 3.125 MG tablet  . cholecalciferol (VITAMIN D) 400 units TABS tablet  . clopidogrel (PLAVIX) 75 MG tablet  . Cyanocobalamin (B-12) 5000 MCG CAPS  . doxycycline (VIBRA-TABS) 100 MG tablet  . ENTRESTO 97-103 MG  . furosemide (LASIX) 40 MG tablet  . hydrocortisone cream 1 %  . ondansetron (ZOFRAN ODT) 4 MG disintegrating tablet  . phenytoin (DILANTIN) 300 MG ER capsule  . potassium chloride 20 MEQ TBCR  . pyridOXINE (VITAMIN B-6) 100 MG tablet  . ranitidine (ZANTAC) 150 MG tablet  . spironolactone (ALDACTONE) 25 MG tablet  . Thiamine HCl (VITAMIN B-1) 250 MG tablet   No current facility-administered medications for this encounter.    . sodium chloride flush (NS) 0.9 % injection 10 mL  . sodium chloride flush (NS) 0.9 % injection 10 mL     Wynonia Musty [facility] Short Stay Center/Anesthesiology Phone [phone] 05/24/2018 11:40 AM

## 2018-05-29 ENCOUNTER — Encounter: Payer: Medicare Other | Admitting: Family Medicine

## 2018-05-31 ENCOUNTER — Encounter (HOSPITAL_COMMUNITY)
Admission: RE | Disposition: A | Discharge status: Home / Self Care | Payer: Self-pay | Source: Ambulatory Visit | Attending: Otolaryngology

## 2018-05-31 ENCOUNTER — Ambulatory Visit (HOSPITAL_COMMUNITY)
Admission: RE | Admit: 2018-05-31 | Discharge: 2018-05-31 | Disposition: A | Discharge status: Home / Self Care | Payer: Medicare Other | Source: Ambulatory Visit | Attending: Otolaryngology | Admitting: Otolaryngology

## 2018-05-31 ENCOUNTER — Encounter (HOSPITAL_COMMUNITY): Payer: Self-pay

## 2018-05-31 ENCOUNTER — Other Ambulatory Visit: Payer: Medicare Other

## 2018-05-31 ENCOUNTER — Ambulatory Visit: Payer: Medicare Other | Admitting: Hematology & Oncology

## 2018-05-31 ENCOUNTER — Ambulatory Visit (HOSPITAL_COMMUNITY): Payer: Medicare Other | Admitting: Certified Registered Nurse Anesthetist

## 2018-05-31 ENCOUNTER — Ambulatory Visit (HOSPITAL_COMMUNITY): Payer: Medicare Other | Admitting: Vascular Surgery

## 2018-05-31 ENCOUNTER — Ambulatory Visit: Payer: Medicare Other

## 2018-05-31 DIAGNOSIS — Z79899 Other long term (current) drug therapy: Secondary | ICD-10-CM | POA: Diagnosis not present

## 2018-05-31 DIAGNOSIS — C44299 Other specified malignant neoplasm of skin of left ear and external auricular canal: Secondary | ICD-10-CM

## 2018-05-31 DIAGNOSIS — Z720 Tobacco use: Secondary | ICD-10-CM | POA: Insufficient documentation

## 2018-05-31 DIAGNOSIS — C44229 Squamous cell carcinoma of skin of left ear and external auricular canal: Secondary | ICD-10-CM | POA: Insufficient documentation

## 2018-05-31 DIAGNOSIS — R569 Unspecified convulsions: Secondary | ICD-10-CM | POA: Insufficient documentation

## 2018-05-31 DIAGNOSIS — K219 Gastro-esophageal reflux disease without esophagitis: Secondary | ICD-10-CM | POA: Diagnosis not present

## 2018-05-31 DIAGNOSIS — Z85528 Personal history of other malignant neoplasm of kidney: Secondary | ICD-10-CM | POA: Diagnosis not present

## 2018-05-31 HISTORY — PX: SKIN FULL THICKNESS GRAFT: SHX442

## 2018-05-31 HISTORY — PX: EAR CYST EXCISION: SHX22

## 2018-05-31 SURGERY — EXCISION, CYST, EAR
Anesthesia: General | Site: Ear | Laterality: Left

## 2018-05-31 MED ORDER — ROCURONIUM BROMIDE 10 MG/ML (PF) SYRINGE
PREFILLED_SYRINGE | INTRAVENOUS | Status: DC | PRN
  Administered 2018-05-31: 50 mg via INTRAVENOUS

## 2018-05-31 MED ORDER — LIDOCAINE-EPINEPHRINE 1 %-1:100000 IJ SOLN
INTRAMUSCULAR | Status: AC
  Filled 2018-05-31: qty 1

## 2018-05-31 MED ORDER — LIDOCAINE-EPINEPHRINE 1 %-1:100000 IJ SOLN
INTRAMUSCULAR | Status: DC | PRN
  Administered 2018-05-31: 9 mL

## 2018-05-31 MED ORDER — PROPOFOL 10 MG/ML IV BOLUS
INTRAVENOUS | Status: AC
  Filled 2018-05-31: qty 20

## 2018-05-31 MED ORDER — ESMOLOL HCL 100 MG/10ML IV SOLN
INTRAVENOUS | Status: AC
  Filled 2018-05-31: qty 10

## 2018-05-31 MED ORDER — FENTANYL CITRATE (PF) 250 MCG/5ML IJ SOLN
INTRAMUSCULAR | Status: AC
  Filled 2018-05-31: qty 5

## 2018-05-31 MED ORDER — LACTATED RINGERS IV SOLN
INTRAVENOUS | Status: DC | PRN
  Administered 2018-05-31: 08:00:00 via INTRAVENOUS

## 2018-05-31 MED ORDER — FENTANYL CITRATE (PF) 100 MCG/2ML IJ SOLN
INTRAMUSCULAR | Status: DC | PRN
  Administered 2018-05-31: 150 ug via INTRAVENOUS

## 2018-05-31 MED ORDER — ROCURONIUM BROMIDE 50 MG/5ML IV SOSY
PREFILLED_SYRINGE | INTRAVENOUS | Status: AC
  Filled 2018-05-31: qty 5

## 2018-05-31 MED ORDER — LIDOCAINE 2% (20 MG/ML) 5 ML SYRINGE
INTRAMUSCULAR | Status: DC | PRN
  Administered 2018-05-31: 80 mg via INTRAVENOUS

## 2018-05-31 MED ORDER — BACITRACIN ZINC 500 UNIT/GM EX OINT
TOPICAL_OINTMENT | CUTANEOUS | Status: AC
  Filled 2018-05-31: qty 28.35

## 2018-05-31 MED ORDER — DEXMEDETOMIDINE HCL 200 MCG/2ML IV SOLN
INTRAVENOUS | Status: DC | PRN
  Administered 2018-05-31: 20 ug via INTRAVENOUS

## 2018-05-31 MED ORDER — KETOROLAC TROMETHAMINE 30 MG/ML IJ SOLN
INTRAMUSCULAR | Status: DC | PRN
  Administered 2018-05-31: 15 mg via INTRAVENOUS

## 2018-05-31 MED ORDER — KETOROLAC TROMETHAMINE 30 MG/ML IJ SOLN
INTRAMUSCULAR | Status: AC
  Filled 2018-05-31: qty 1

## 2018-05-31 MED ORDER — LIDOCAINE HCL 1 % IJ SOLN
INTRAMUSCULAR | Status: AC
  Filled 2018-05-31: qty 20

## 2018-05-31 MED ORDER — ONDANSETRON HCL 4 MG/2ML IJ SOLN
INTRAMUSCULAR | Status: AC
  Filled 2018-05-31: qty 2

## 2018-05-31 MED ORDER — ONDANSETRON HCL 4 MG/2ML IJ SOLN
INTRAMUSCULAR | Status: DC | PRN
  Administered 2018-05-31: 4 mg via INTRAVENOUS

## 2018-05-31 MED ORDER — PHENYLEPHRINE 40 MCG/ML (10ML) SYRINGE FOR IV PUSH (FOR BLOOD PRESSURE SUPPORT)
PREFILLED_SYRINGE | INTRAVENOUS | Status: DC | PRN
  Administered 2018-05-31: 80 ug via INTRAVENOUS
  Administered 2018-05-31: 40 ug via INTRAVENOUS
  Administered 2018-05-31 (×2): 80 ug via INTRAVENOUS

## 2018-05-31 MED ORDER — EPHEDRINE SULFATE-NACL 50-0.9 MG/10ML-% IV SOSY
PREFILLED_SYRINGE | INTRAVENOUS | Status: DC | PRN
  Administered 2018-05-31 (×3): 5 mg via INTRAVENOUS
  Administered 2018-05-31: 10 mg via INTRAVENOUS
  Administered 2018-05-31: 5 mg via INTRAVENOUS

## 2018-05-31 MED ORDER — ONDANSETRON HCL 4 MG/2ML IJ SOLN: 4.0000 mg | Freq: Once | INTRAMUSCULAR | Status: DC | PRN

## 2018-05-31 MED ORDER — PROPOFOL 10 MG/ML IV BOLUS
INTRAVENOUS | Status: DC | PRN
  Administered 2018-05-31: 110 mg via INTRAVENOUS

## 2018-05-31 MED ORDER — DEXAMETHASONE SODIUM PHOSPHATE 10 MG/ML IJ SOLN
INTRAMUSCULAR | Status: AC
  Filled 2018-05-31: qty 1

## 2018-05-31 MED ORDER — SUGAMMADEX SODIUM 200 MG/2ML IV SOLN
INTRAVENOUS | Status: DC | PRN
  Administered 2018-05-31: 200 mg via INTRAVENOUS

## 2018-05-31 MED ORDER — 0.9 % SODIUM CHLORIDE (POUR BTL) OPTIME
TOPICAL | Status: DC | PRN
  Administered 2018-05-31: 1000 mL

## 2018-05-31 MED ORDER — LIDOCAINE 2% (20 MG/ML) 5 ML SYRINGE
INTRAMUSCULAR | Status: AC
  Filled 2018-05-31: qty 5

## 2018-05-31 MED ORDER — MEPERIDINE HCL 50 MG/ML IJ SOLN: 6.2500 mg | INTRAMUSCULAR | Status: DC | PRN

## 2018-05-31 MED ORDER — CIPROFLOXACIN-DEXAMETHASONE 0.3-0.1 % OT SUSP
OTIC | Status: AC
  Filled 2018-05-31: qty 7.5

## 2018-05-31 MED ORDER — CEFAZOLIN SODIUM 1 G IJ SOLR
INTRAMUSCULAR | Status: AC
  Filled 2018-05-31: qty 20

## 2018-05-31 MED ORDER — CEFAZOLIN SODIUM-DEXTROSE 2-3 GM-%(50ML) IV SOLR
INTRAVENOUS | Status: DC | PRN
  Administered 2018-05-31: 2 g via INTRAVENOUS

## 2018-05-31 MED ORDER — AMOXICILLIN 875 MG PO TABS: 875.0000 mg | ORAL_TABLET | Freq: Two times a day (BID) | ORAL | 0 refills | Status: AC

## 2018-05-31 MED ORDER — HYDROMORPHONE HCL 1 MG/ML IJ SOLN: 0.2500 mg | INTRAMUSCULAR | Status: DC | PRN

## 2018-05-31 MED ORDER — DEXAMETHASONE SODIUM PHOSPHATE 10 MG/ML IJ SOLN
INTRAMUSCULAR | Status: DC | PRN
  Administered 2018-05-31: 10 mg via INTRAVENOUS

## 2018-05-31 MED ORDER — ESMOLOL HCL 100 MG/10ML IV SOLN
INTRAVENOUS | Status: DC | PRN
  Administered 2018-05-31: 30 mg via INTRAVENOUS

## 2018-05-31 SURGICAL SUPPLY — 29 items
BALL CTTN LRG ABS STRL LF (GAUZE/BANDAGES/DRESSINGS) ×4
BLADE SURG 15 STRL LF DISP TIS (BLADE) IMPLANT
BLADE SURG 15 STRL SS (BLADE) ×4
CANISTER SUCT 3000ML PPV (MISCELLANEOUS) ×4 IMPLANT
CONT SPEC 4OZ CLIKSEAL STRL BL (MISCELLANEOUS) ×18 IMPLANT
COTTONBALL LRG STERILE PKG (GAUZE/BANDAGES/DRESSINGS) ×4 IMPLANT
COVER SURGICAL LIGHT HANDLE (MISCELLANEOUS) ×3 IMPLANT
COVER WAND RF STERILE (DRAPES) ×4 IMPLANT
CRADLE DONUT ADULT HEAD (MISCELLANEOUS) ×2 IMPLANT
DRAPE SURG 17X23 STRL (DRAPES) ×2 IMPLANT
DRSG TEGADERM 4X4.75 (GAUZE/BANDAGES/DRESSINGS) ×4 IMPLANT
ELECT COATED BLADE 2.86 ST (ELECTRODE) ×3 IMPLANT
GAUZE SPONGE 4X4 12PLY STRL (GAUZE/BANDAGES/DRESSINGS) ×3 IMPLANT
GAUZE XEROFORM 5X9 LF (GAUZE/BANDAGES/DRESSINGS) ×2 IMPLANT
GLOVE ECLIPSE 7.5 STRL STRAW (GLOVE) ×10 IMPLANT
KIT TURNOVER KIT B (KITS) ×4 IMPLANT
MARKER SKIN DUAL TIP RULER LAB (MISCELLANEOUS) ×4 IMPLANT
NDL HYPO 25GX1X1/2 BEV (NEEDLE) IMPLANT
NEEDLE HYPO 25GX1X1/2 BEV (NEEDLE) ×4 IMPLANT
PAD ARMBOARD 7.5X6 YLW CONV (MISCELLANEOUS) ×8 IMPLANT
PENCIL BUTTON HOLSTER BLD 10FT (ELECTRODE) ×3 IMPLANT
SUT PLAIN 5 0 P 3 18 (SUTURE) ×6 IMPLANT
SUT PROLENE 3 0 PS 2 (SUTURE) ×3 IMPLANT
SUT PROLENE 4 0 RB 1 (SUTURE) ×4
SUT PROLENE 4-0 RB1 .5 CRCL 36 (SUTURE) ×1 IMPLANT
SUT VIC AB 4-0 PS2 27 (SUTURE) ×2 IMPLANT
TOWEL OR 17X24 6PK STRL BLUE (TOWEL DISPOSABLE) ×4 IMPLANT
TRAY ENT MC OR (CUSTOM PROCEDURE TRAY) ×2 IMPLANT
YANKAUER SUCT BULB TIP NO VENT (SUCTIONS) ×2 IMPLANT

## 2018-05-31 NOTE — H&P (Signed)
Cc: Left ear mass  HPI: The patient is a 67 y/o male who presents today with his daughter for evaluation of a left ear mass. The patient is seen in consultation requested by [facility]. The patient first noticed the mass behind his left ear over 8 months ago. The mass has gradually gotten larger with ulceration noted. His daughter states he picks on the area frequently. The patient has no history of skin cancer. He denies any change in his hearing. No previous ENT surgery is noted.   The patient's review of systems (constitutional, eyes, ENT, cardiovascular, respiratory, GI, musculoskeletal, skin, neurologic, psychiatric, endocrine, hematologic, allergic) is noted in the ROS questionnaire. It is reviewed with the patient.   Family health history: Heart disease.  Major events: Heart surgery/stent, kidney cancer.  Ongoing medical problems: cataracts, reflux, kidney cancer, seizures, kidney cancer.  Social history: The patient is single. He chews tobacco daily. He denies the use of alcohol or illegal drugs.   Exam: General: Communicates without difficulty, well nourished, no acute distress. Head: Normocephalic, no evidence injury, no tenderness, facial buttresses intact without stepoff. Eyes: PERRL, EOMI. No scleral icterus, conjunctivae clear. Neuro: CN II exam reveals vision grossly intact. No nystagmus at any point of gaze. Ears: Auricles well formed. Large ulcerative mass noted behind the left ear. Ear canals are intact without mass or lesion. No erythema or edema is appreciated. The TMs are intact without fluid. Nose: External evaluation reveals normal support and skin without lesions. Dorsum is intact. Anterior rhinoscopy reveals healthy pink mucosa over anterior aspect of inferior turbinates and intact septum. No purulence noted. Oral: Oral cavity and oropharynx are intact, symmetric, without erythema or edema. Mucosa is moist without lesions. Neck: Full range of motion without pain. There  is no significant lymphadenopathy. No masses palpable. Thyroid bed within normal limits to palpation. Parotid glands and submandibular glands equal bilaterally without mass. Trachea is midline. Neuro: CN 2-12 grossly intact. Gait normal. Vestibular: No nystagmus at any point of gaze. The cerebellar examination is unremarkable.   Assessment  1. Large left postauricular ulcerative mass. Appearance is concerning for skin cancer.  2. Biopsy is c/w SCCA.  Plan: 1. The physical exam findings are reviewed with the patient and his daughter.  2. Biopsy obtained without difficulty. 3. Plan Surgical excision with full thickness skin graft in OR.

## 2018-05-31 NOTE — Transfer of Care (Signed)
Immediate Anesthesia Transfer of Care Note  Patient: Krishan Wisor  Procedure(s) Performed: EXCISION LEFT POSTERIOR EAR TUMOR (Left Ear) SKIN GRAFT FULL THICKNESS FROM LEFT ABDOMEN TO LEFT EAR (Left Abdomen)  Patient Location: PACU  Anesthesia Type:General  Level of Consciousness: awake, alert  and oriented  Airway & Oxygen Therapy: Patient Spontanous Breathing and Patient connected to face mask oxygen  Post-op Assessment: Report given to RN and Post -op Vital signs reviewed and stable  Post vital signs: Reviewed and stable  Last Vitals:  Vitals Value Taken Time  BP 151/88 05/31/2018 10:26 AM  Temp    Pulse 70 05/31/2018 10:27 AM  Resp 21 05/31/2018 10:27 AM  SpO2 100 % 05/31/2018 10:27 AM  Vitals shown include unvalidated device data.  Last Pain:  Vitals:   05/31/18 0644  TempSrc: Oral  PainSc: 0-No pain      Patients Stated Pain Goal: 0 (15/72/62 0355)  Complications: No apparent anesthesia complications

## 2018-05-31 NOTE — Discharge Instructions (Addendum)
The patient should avoid heavy lifting more than 20 pounds for the next week. He Bendavid resume his previous diet. He should leave the left ear bolster dressing in place. The dressing will be removed at his follow-up visit in one week.

## 2018-05-31 NOTE — Op Note (Signed)
DATE OF PROCEDURE:  05/31/2018                              OPERATIVE REPORT  SURGEON:  Leta Baptist, MD  PREOPERATIVE DIAGNOSES: 1. Left auricular squamous cell carcinoma  POSTOPERATIVE DIAGNOSES: 1. Left auricular squamous cell carcinoma  PROCEDURE PERFORMED:   1. Excision of left auricular squamous cell carcinoma (4.5 cm 3.5 cm) 2. Full-thickness skin graft from the left lower quadrant of the abdomen.  ANESTHESIA:  General endotracheal tube anesthesia.  COMPLICATIONS:  None.  ESTIMATED BLOOD LOSS:  Minimal.  INDICATION FOR PROCEDURE:  Gilbert [name] is a 67 y.o. male with a growing left posterior auricular mass. The mass was biopsied in my office. The pathology was consistent with squamous cell carcinoma. Based on the above findings, the decision was made for the patient to undergo the above-stated procedures.  The risks, benefits, alternatives, and details of the procedure were discussed with the patient and his daughter.  Questions were invited and answered.  Informed consent was obtained.  DESCRIPTION:  The patient was taken to the operating room and placed supine on the operating table.  General endotracheal tube anesthesia was administered by the anesthesiologist.  The patient was positioned and prepped and draped in a standard fashion for left ear surgery.    The patient was noted to have a large soft tissue mass on the posterior aspect of the left auricle. 1% lidocaine with 1-100,000 epinephrine was infiltrated around the mass. Using a #15 blade, the entire mass was excised from the surrounding soft tissue. The mass was separated easily from the underlying auricular cartilage. No obvious cartilaginous invasion was noted. After the mass was removed, circumferential and deep margins were obtained. All margins were negative for squamous cell carcinoma. The surgical site was copiously irrigated.  Attention was then focused on obtaining the full-thickness skin graft. 1% lidocaine with  1-100,000 epinephrine was infiltrated at the planned site of donor graft. A 4.5 cm x 3.5 cm elliptical incision was made at the left lower quadrant of the abdomen. The full-thickness skin graft was obtained without difficulty. Extensive undermining was performed on the surrounding skin. The incision was closed in layers with 4-0 Vicryl and 4-0 Prolene sutures.  The full-thickness skin graft was used to close the left auricular surgical defect. The skin graft was sutured in place with 5-0 plain gut sutures. A Xeroform bolster dressing was applied.  The care of the patient was turned over to the anesthesiologist.  The patient was awakened from anesthesia without difficulty.  The patient was extubated and transferred to the recovery room in good condition.  OPERATIVE FINDINGS:  Left posterior auricular squamous cell carcinoma.  SPECIMEN:  Left auricular squamous cell carcinoma specimen, circumferential and deep margins.  FOLLOWUP CARE:  The patient will be discharged home once awake and alert.  The patient will follow up in my office in approximately 1 week for suture and bolster removal.  Gilbert [name] 05/31/2018 10:19 AM

## 2018-05-31 NOTE — Anesthesia Procedure Notes (Signed)
Procedure Name: Intubation Performed by: Milford Cage, CRNA Pre-anesthesia Checklist: Patient identified, Emergency Drugs available, Suction available and Patient being monitored Patient Re-evaluated:Patient Re-evaluated prior to induction Oxygen Delivery Method: Circle System Utilized Preoxygenation: Pre-oxygenation with 100% oxygen Induction Type: IV induction Ventilation: Mask ventilation without difficulty and Oral airway inserted - appropriate to patient size Laryngoscope Size: Miller and 3 Grade View: Grade I Tube type: Oral Tube size: 7.5 mm Number of attempts: 2 Airway Equipment and Method: Stylet and Oral airway Placement Confirmation: ETT inserted through vocal cords under direct vision,  positive ETCO2 and breath sounds checked- equal and bilateral Secured at: 22 cm Tube secured with: Tape Dental Injury: Teeth and Oropharynx as per pre-operative assessment  Comments: 1st attempt MAC3 with grade 2b view. Mil3 successful with G1 view

## 2018-05-31 NOTE — Anesthesia Postprocedure Evaluation (Signed)
Anesthesia Post Note  Patient: Gilbert [name]  Procedure(s) Performed: EXCISION LEFT POSTERIOR EAR TUMOR (Left Ear) SKIN GRAFT FULL THICKNESS FROM LEFT ABDOMEN TO LEFT EAR (Left Abdomen)     Patient location during evaluation: PACU Anesthesia Type: General Level of consciousness: awake and alert Pain management: pain level controlled Vital Signs Assessment: post-procedure vital signs reviewed and stable Respiratory status: spontaneous breathing, nonlabored ventilation, respiratory function stable and patient connected to nasal cannula oxygen Cardiovascular status: blood pressure returned to baseline and stable Postop Assessment: no apparent nausea or vomiting Anesthetic complications: no    Last Vitals:  Vitals:   05/31/18 1111 05/31/18 1126  BP: 138/69 118/69  Pulse: 65 79  Resp: 14 (!) 22  Temp:    SpO2: 96% (!) 89%    Last Pain:  Vitals:   05/31/18 1126  TempSrc:   PainSc: 0-No pain                 Aariana Shankland DAVID

## 2018-05-31 NOTE — Addendum Note (Signed)
Addendum  created 05/31/18 1213 by Milford Cage, CRNA   Intraprocedure Meds edited

## 2018-05-31 NOTE — Anesthesia Preprocedure Evaluation (Addendum)
Anesthesia Evaluation  Patient identified by MRN, date of birth, ID band Patient awake    Reviewed: Allergy & Precautions, NPO status , Patient's Chart, lab work & pertinent test results  Airway Mallampati: I  TM Distance: >3 FB Neck ROM: Full    Dental   Pulmonary shortness of breath, former smoker,    Pulmonary exam normal        Cardiovascular hypertension, Pt. on medications + CAD and + Cardiac Stents  Normal cardiovascular exam     Neuro/Psych    GI/Hepatic GERD  Medicated and Controlled,  Endo/Other    Renal/GU      Musculoskeletal   Abdominal   Peds  Hematology   Anesthesia Other Findings   Reproductive/Obstetrics                             Anesthesia Physical Anesthesia Plan  ASA: III  Anesthesia Plan: General   Post-op Pain Management:    Induction:   PONV Risk Score and Plan: 2  Airway Management Planned: Oral ETT  Additional Equipment:   Intra-op Plan:   Post-operative Plan: Extubation in OR  Informed Consent: I have reviewed the patients History and Physical, chart, labs and discussed the procedure including the risks, benefits and alternatives for the proposed anesthesia with the patient or authorized representative who has indicated his/her understanding and acceptance.     Plan Discussed with: CRNA and Surgeon  Anesthesia Plan Comments:         Anesthesia Quick Evaluation

## 2018-06-01 ENCOUNTER — Encounter (HOSPITAL_COMMUNITY): Payer: Self-pay | Admitting: Otolaryngology

## 2018-07-03 ENCOUNTER — Telehealth: Payer: Self-pay | Admitting: Family

## 2018-07-03 NOTE — Telephone Encounter (Signed)
LMVM for patient regarding r/s 12/6 appts/ I left my name/number and asked him to call me back to confirm new date/time

## 2018-07-07 ENCOUNTER — Ambulatory Visit: Payer: Medicare Other | Admitting: Family

## 2018-07-07 ENCOUNTER — Other Ambulatory Visit: Payer: Medicare Other

## 2018-07-21 ENCOUNTER — Encounter: Payer: Self-pay | Admitting: Family

## 2018-07-21 ENCOUNTER — Inpatient Hospital Stay (HOSPITAL_BASED_OUTPATIENT_CLINIC_OR_DEPARTMENT_OTHER): Payer: Medicare Other | Admitting: Family

## 2018-07-21 ENCOUNTER — Inpatient Hospital Stay: Payer: Medicare Other

## 2018-07-21 ENCOUNTER — Inpatient Hospital Stay: Payer: Medicare Other | Attending: Hematology & Oncology

## 2018-07-21 ENCOUNTER — Other Ambulatory Visit: Payer: Self-pay

## 2018-07-21 VITALS — BP 149/75 | HR 55 | Temp 98.3°F | Resp 17 | Wt 169.0 lb

## 2018-07-21 DIAGNOSIS — Z8572 Personal history of non-Hodgkin lymphomas: Secondary | ICD-10-CM

## 2018-07-21 DIAGNOSIS — C8589 Other specified types of non-Hodgkin lymphoma, extranodal and solid organ sites: Secondary | ICD-10-CM

## 2018-07-21 DIAGNOSIS — T451X5S Adverse effect of antineoplastic and immunosuppressive drugs, sequela: Secondary | ICD-10-CM | POA: Insufficient documentation

## 2018-07-21 DIAGNOSIS — D6481 Anemia due to antineoplastic chemotherapy: Secondary | ICD-10-CM

## 2018-07-21 DIAGNOSIS — Z79899 Other long term (current) drug therapy: Secondary | ICD-10-CM

## 2018-07-21 DIAGNOSIS — M8580 Other specified disorders of bone density and structure, unspecified site: Secondary | ICD-10-CM

## 2018-07-21 DIAGNOSIS — Z85528 Personal history of other malignant neoplasm of kidney: Secondary | ICD-10-CM | POA: Diagnosis not present

## 2018-07-21 DIAGNOSIS — C649 Malignant neoplasm of unspecified kidney, except renal pelvis: Secondary | ICD-10-CM

## 2018-07-21 DIAGNOSIS — Z85828 Personal history of other malignant neoplasm of skin: Secondary | ICD-10-CM

## 2018-07-21 DIAGNOSIS — Z95828 Presence of other vascular implants and grafts: Secondary | ICD-10-CM

## 2018-07-21 DIAGNOSIS — C8330 Diffuse large B-cell lymphoma, unspecified site: Secondary | ICD-10-CM

## 2018-07-21 DIAGNOSIS — C8339 Diffuse large B-cell lymphoma, extranodal and solid organ sites: Secondary | ICD-10-CM | POA: Insufficient documentation

## 2018-07-21 DIAGNOSIS — Z9221 Personal history of antineoplastic chemotherapy: Secondary | ICD-10-CM

## 2018-07-21 DIAGNOSIS — Z905 Acquired absence of kidney: Secondary | ICD-10-CM | POA: Insufficient documentation

## 2018-07-21 DIAGNOSIS — C8580 Other specified types of non-Hodgkin lymphoma, unspecified site: Secondary | ICD-10-CM

## 2018-07-21 DIAGNOSIS — D630 Anemia in neoplastic disease: Secondary | ICD-10-CM

## 2018-07-21 LAB — RETICULOCYTES
IMMATURE RETIC FRACT: 4.6 % (ref 2.3–15.9)
RBC.: 4.12 MIL/uL — AB (ref 4.22–5.81)
RETIC COUNT ABSOLUTE: 44.1 10*3/uL (ref 19.0–186.0)
Retic Ct Pct: 1.1 % (ref 0.4–3.1)

## 2018-07-21 LAB — CMP (CANCER CENTER ONLY)
ALBUMIN: 3.8 g/dL (ref 3.5–5.0)
ALK PHOS: 136 U/L — AB (ref 38–126)
ALT: 15 U/L (ref 0–44)
ANION GAP: 7 (ref 5–15)
AST: 21 U/L (ref 15–41)
BILIRUBIN TOTAL: 0.3 mg/dL (ref 0.3–1.2)
BUN: 26 mg/dL — ABNORMAL HIGH (ref 8–23)
CALCIUM: 8.7 mg/dL — AB (ref 8.9–10.3)
CO2: 27 mmol/L (ref 22–32)
Chloride: 104 mmol/L (ref 98–111)
Creatinine: 1.49 mg/dL — ABNORMAL HIGH (ref 0.61–1.24)
GFR, EST AFRICAN AMERICAN: 55 mL/min — AB (ref >60)
GFR, Estimated: 48 mL/min — ABNORMAL LOW (ref >60)
Glucose, Bld: 77 mg/dL (ref 70–99)
POTASSIUM: 3.9 mmol/L (ref 3.5–5.1)
Sodium: 138 mmol/L (ref 135–145)
TOTAL PROTEIN: 5.7 g/dL — AB (ref 6.5–8.1)

## 2018-07-21 LAB — IRON AND TIBC
Iron: 110 ug/dL (ref 42–163)
SATURATION RATIOS: 43 % (ref 20–55)
TIBC: 256 ug/dL (ref 202–409)
UIBC: 146 ug/dL (ref 117–376)

## 2018-07-21 LAB — CBC WITH DIFFERENTIAL (CANCER CENTER ONLY)
ABS IMMATURE GRANULOCYTES: 0.02 10*3/uL (ref 0.00–0.07)
BASOS PCT: 1 %
Basophils Absolute: 0.1 10*3/uL (ref 0.0–0.1)
EOS ABS: 0.8 10*3/uL — AB (ref 0.0–0.5)
Eosinophils Relative: 11 %
HEMATOCRIT: 37.8 % — AB (ref 39.0–52.0)
Hemoglobin: 12.4 g/dL — ABNORMAL LOW (ref 13.0–17.0)
IMMATURE GRANULOCYTES: 0 %
LYMPHS ABS: 1.1 10*3/uL (ref 0.7–4.0)
Lymphocytes Relative: 14 %
MCH: 30.1 pg (ref 26.0–34.0)
MCHC: 32.8 g/dL (ref 30.0–36.0)
MCV: 91.7 fL (ref 80.0–100.0)
MONOS PCT: 8 %
Monocytes Absolute: 0.6 10*3/uL (ref 0.1–1.0)
NEUTROS ABS: 5.1 10*3/uL (ref 1.7–7.7)
NEUTROS PCT: 66 %
NRBC: 0 % (ref 0.0–0.2)
PLATELETS: 164 10*3/uL (ref 150–400)
RBC: 4.12 MIL/uL — ABNORMAL LOW (ref 4.22–5.81)
RDW: 12.8 % (ref 11.5–15.5)
WBC Count: 7.6 10*3/uL (ref 4.0–10.5)

## 2018-07-21 LAB — FERRITIN: Ferritin: 85 ng/mL (ref 24–336)

## 2018-07-21 LAB — LACTATE DEHYDROGENASE: LDH: 188 U/L (ref 98–192)

## 2018-07-21 MED ORDER — SODIUM CHLORIDE 0.9% FLUSH
10.0000 mL | INTRAVENOUS | Status: DC | PRN
  Administered 2018-07-21: 10 mL via INTRAVENOUS
  Filled 2018-07-21: qty 10

## 2018-07-21 MED ORDER — HEPARIN SOD (PORK) LOCK FLUSH 100 UNIT/ML IV SOLN
500.0000 [IU] | Freq: Once | INTRAVENOUS | Status: AC
  Administered 2018-07-21: 500 [IU] via INTRAVENOUS
  Filled 2018-07-21: qty 5

## 2018-07-21 MED ORDER — DENOSUMAB 120 MG/1.7ML ~~LOC~~ SOLN
SUBCUTANEOUS | Status: AC
  Filled 2018-07-21: qty 1.7

## 2018-07-21 MED ORDER — DENOSUMAB 120 MG/1.7ML ~~LOC~~ SOLN
120.0000 mg | Freq: Once | SUBCUTANEOUS | Status: AC
  Administered 2018-07-21: 120 mg via SUBCUTANEOUS

## 2018-07-21 NOTE — Progress Notes (Signed)
Hematology and Oncology Follow Up Visit  Gilbert [name] [id number] October 10, 1950 67 y.o. 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 He is doing well and has no complaints at this time.  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 The incision site has healed nicely and he follows up with dermatology again 6 months.  No fever, chills, n/v, cough, rash, dizziness, SOB, chest pain, palpitations, abdominal pain or changes in bowel or bladder habits.  No swelling, tenderness, numbness or tingling in his extremities.  No c/o pain.  No episodes of bleeding, no bruising or petechiae.  No lymphadenopathy noted on exam.  He has maintained a good appetite and is staying well hydrated. His weight is stable.   ECOG Performance Status: 0 - Asymptomatic  Medications:  Allergies as of 07/21/2018      Reactions   Oxycodone Swelling, Other (See Comments)   Tongue and lips swell      Medication List       Accurate as of July 21, 2018 11:03 AM. Always use your most recent med list.        acetaminophen 500 MG tablet Commonly known as:  TYLENOL Take 500 mg by mouth every 6 (six) hours as needed for mild pain or headache.   atorvastatin 80 MG tablet Commonly known as:  LIPITOR Take 80 mg by mouth daily.   B-12 5000 MCG Caps Take 5,000 mcg by mouth every morning.   calcium carbonate 500 MG chewable tablet Commonly known as:  TUMS - dosed in mg elemental calcium Chew 1 tablet by mouth 3 (three) times daily as needed for indigestion or heartburn.   carvedilol 12.5 MG tablet Commonly known as:  COREG Take  1 tablet (12.5 mg total) by mouth 2 (two) times daily with a meal.   cholecalciferol 10 MCG (400 UNIT) Tabs tablet Commonly known as:  VITAMIN D3 Take 400 Units by mouth daily.   ENTRESTO 97-103 MG Generic drug:  sacubitril-valsartan Take 1 tablet by mouth 2 (two) times daily.   famotidine 20 MG tablet Commonly known as:  PEPCID Take 20 mg by mouth 2 (two) times daily.   furosemide 40 MG tablet Commonly known as:  LASIX Take 1 tablet (40 mg total) by mouth 2 (two) times daily.   hydrocortisone cream 1 % Apply 1 application topically 4 (four) times daily as needed for itching.   phenytoin 300 MG ER capsule Commonly known as:  DILANTIN TAKE 1 CAPSULE BY MOUTH IN THE MORNING   Potassium Chloride ER 20 MEQ Tbcr Take 20 mEq by mouth daily.   pyridOXINE 100 MG tablet Commonly known as:  VITAMIN B-6 Take 100 mg by mouth every morning.   spironolactone 25 MG tablet Commonly known as:  ALDACTONE Take 1 tablet (25 mg total) by mouth daily.   vitamin B-1 250 MG tablet Take 250 mg by mouth every morning.       Allergies:  Allergies  Allergen Reactions  . Oxycodone Swelling and Other (See Comments)    Tongue and lips  swell    Past Medical History, Surgical history, Social history, and Family History were reviewed and updated.  Review of Systems: All other 10 point review of systems is negative.   Physical Exam:  weight is 169 lb (76.7 kg). His oral temperature is 98.3 F (36.8 C). His blood pressure is 149/75 (abnormal) and his pulse is 55 (abnormal). His respiration is 17.   Wt Readings from Last 3 Encounters:  07/21/18 169 lb (76.7 kg)  05/31/18 164 lb 1.6 oz (74.4 kg)  05/23/18 164 lb 1.6 oz (74.4 kg)    Ocular: Sclerae unicteric, pupils equal, round and reactive to light Ear-nose-throat: Oropharynx clear, dentition fair Lymphatic: No cervical, supraclavicular or axillary adenopathy Lungs no rales or rhonchi, good excursion bilaterally Heart regular rate and  rhythm, no murmur appreciated Abd soft, nontender, positive bowel sounds, no liver or spleen tip palpated on exam, no fluid wave  MSK no focal spinal tenderness, no joint edema Neuro: non-focal, well-oriented, appropriate affect Breasts: Deferred   Lab Results  Component Value Date   WBC 7.6 07/21/2018   HGB 12.4 (L) 07/21/2018   HCT 37.8 (L) 07/21/2018   MCV 91.7 07/21/2018   PLT 164 07/21/2018   Lab Results  Component Value Date   FERRITIN 108 03/07/2018   IRON 60 03/07/2018   TIBC 230 03/07/2018   UIBC 170 03/07/2018   IRONPCTSAT 26 (L) 03/07/2018   Lab Results  Component Value Date   RETICCTPCT 1.1 07/21/2018   RBC 4.12 (L) 07/21/2018   RBC 4.12 (L) 07/21/2018   No results found for: KPAFRELGTCHN, LAMBDASER, KAPLAMBRATIO No results found for: IGGSERUM, IGA, IGMSERUM No results found for: Odetta Pink, SPEI   Chemistry      Component Value Date/Time   NA 138 07/21/2018 0920   NA 140 04/20/2017 1417   K 3.9 07/21/2018 0920   K 3.6 04/20/2017 1417   CL 104 07/21/2018 0920   CL 109 (H) 04/20/2017 1417   CO2 27 07/21/2018 0920   CO2 27 04/20/2017 1417   BUN 26 (H) 07/21/2018 0920   BUN 15 04/20/2017 1417   CREATININE 1.49 (H) 07/21/2018 0920   CREATININE 1.3 (H) 04/20/2017 1417      Component Value Date/Time   CALCIUM 8.7 (L) 07/21/2018 0920   CALCIUM 8.8 04/20/2017 1417   ALKPHOS 136 (H) 07/21/2018 0920   ALKPHOS 104 (H) 04/20/2017 1417   AST 21 07/21/2018 0920   ALT 15 07/21/2018 0920   ALT 27 04/20/2017 1417   BILITOT 0.3 07/21/2018 0920       Impression and Plan: Gilbert [name] is a very pleasant 67 yo caucasian gentleman with history of 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 He completed 8 cycles of R-CEOP in November 2017.  He is doing well and has no complaints at this time. So far,  there has been no evidence of recurrence.  He received Xgeva today.  We will see him in another 3 months for MD follow-up, lab and injection.  He will contact our office with any questions or concerns. We can certainly see him sooner if need be.   Laverna Peace, NP 12/20/201911:03 AM

## 2018-07-21 NOTE — Patient Instructions (Signed)
Denosumab injection What is this medicine? DENOSUMAB (den oh sue mab) slows bone breakdown. Prolia is used to treat osteoporosis in women after menopause and in men, and in people who are taking corticosteroids for 6 months or more. Xgeva is used to treat a high calcium level due to cancer and to prevent bone fractures and other bone problems caused by multiple myeloma or cancer bone metastases. Xgeva is also used to treat giant cell tumor of the bone. This medicine Savarino be used for other purposes; ask your health care provider or pharmacist if you have questions. COMMON BRAND NAME(S): Prolia, XGEVA What should I tell my health care provider before I take this medicine? They need to know if you have any of these conditions: -dental disease -having surgery or tooth extraction -infection -kidney disease -low levels of calcium or Vitamin D in the blood -malnutrition -on hemodialysis -skin conditions or sensitivity -thyroid or parathyroid disease -an unusual reaction to denosumab, other medicines, foods, dyes, or preservatives -pregnant or trying to get pregnant -breast-feeding How should I use this medicine? This medicine is for injection under the skin. It is given by a health care professional in a hospital or clinic setting. A special MedGuide will be given to you before each treatment. Be sure to read this information carefully each time. For Prolia, talk to your pediatrician regarding the use of this medicine in children. Special care [place] be needed. For Xgeva, talk to your pediatrician regarding the use of this medicine in children. While this drug Aguallo be prescribed for children as young as 13 years for selected conditions, precautions do apply. Overdosage: If you think you have taken too much of this medicine contact a poison control center or emergency room at once. NOTE: This medicine is only for you. Do not share this medicine with others. What if I miss a dose? It is important not to  miss your dose. Call your doctor or health care professional if you are unable to keep an appointment. What Mo interact with this medicine? Do not take this medicine with any of the following medications: -other medicines containing denosumab This medicine Vo also interact with the following medications: -medicines that lower your chance of fighting infection -steroid medicines like prednisone or cortisone This list Limones not describe all possible interactions. Give your health care provider a list of all the medicines, herbs, non-prescription drugs, or dietary supplements you use. Also tell them if you smoke, drink alcohol, or use illegal drugs. Some items Crock interact with your medicine. What should I watch for while using this medicine? Visit your doctor or health care professional for regular checks on your progress. Your doctor or health care professional Scheffel order blood tests and other tests to see how you are doing. Call your doctor or health care professional for advice if you get a fever, chills or sore throat, or other symptoms of a cold or flu. Do not treat yourself. This drug Pancake decrease your body's ability to fight infection. Try to avoid being around people who are sick. You should make sure you get enough calcium and vitamin D while you are taking this medicine, unless your doctor tells you not to. Discuss the foods you eat and the vitamins you take with your health care professional. See your dentist regularly. Brush and floss your teeth as directed. Before you have any dental work done, tell your dentist you are receiving this medicine. Do not become pregnant while taking this medicine or for 5 months   after stopping it. Talk with your doctor or health care professional about your birth control options while taking this medicine. Women should inform their doctor if they wish to become pregnant or think they might be pregnant. There is a potential for serious side effects to an unborn  child. Talk to your health care professional or pharmacist for more information. What side effects Slusher I notice from receiving this medicine? Side effects that you should report to your doctor or health care professional as soon as possible: -allergic reactions like skin rash, itching or hives, swelling of the face, lips, or tongue -bone pain -breathing problems -dizziness -jaw pain, especially after dental work -redness, blistering, peeling of the skin -signs and symptoms of infection like fever or chills; cough; sore throat; pain or trouble passing urine -signs of low calcium like fast heartbeat, muscle cramps or muscle pain; pain, tingling, numbness in the hands or feet; seizures -unusual bleeding or bruising -unusually weak or tired Side effects that usually do not require medical attention (report to your doctor or health care professional if they continue or are bothersome): -constipation -diarrhea -headache -joint pain -loss of appetite -muscle pain -runny nose -tiredness -upset stomach This list Mathwig not describe all possible side effects. Call your doctor for medical advice about side effects. You Rodkey report side effects to FDA at 1-800-FDA-1088. Where should I keep my medicine? This medicine is only given in a clinic, doctor's office, or other health care setting and will not be stored at home. NOTE: This sheet is a summary. It Passey not cover all possible information. If you have questions about this medicine, talk to your doctor, pharmacist, or health care provider.  2019 Elsevier/Gold Standard (2017-11-25 16:10:44)

## 2018-08-10 ENCOUNTER — Encounter: Payer: Self-pay | Admitting: Family Medicine

## 2018-10-18 ENCOUNTER — Other Ambulatory Visit: Payer: Self-pay | Admitting: Family Medicine

## 2018-10-18 ENCOUNTER — Telehealth: Payer: Self-pay | Admitting: Family

## 2018-10-18 DIAGNOSIS — G40909 Epilepsy, unspecified, not intractable, without status epilepticus: Secondary | ICD-10-CM

## 2018-10-18 NOTE — Telephone Encounter (Signed)
Requested medication (s) are due for refill today: yes  Requested medication (s) are on the active medication list: yes  Last refill:  02/24/18   Future visit scheduled: no  Notes to clinic:  Medication not delegated to NT to refill   Requested Prescriptions  Pending Prescriptions Disp Refills   phenytoin (DILANTIN) 300 MG ER capsule [Pharmacy Med Name: Phenytoin Sodium Extended 300 MG Oral Capsule] 90 capsule 0    Sig: TAKE 1 CAPSULE BY MOUTH IN THE MORNING     Not Delegated - Neurology:  Anticonvulsants - phenytoin Failed - 10/18/2018  2:10 PM      Failed - This refill cannot be delegated      Failed - HGB in normal range and within 360 days    Hemoglobin  Date Value Ref Range Status  07/21/2018 12.4 (L) 13.0 - 17.0 g/dL Final   HGB  Date Value Ref Range Status  04/20/2017 10.4 (L) 13.0 - 17.1 g/dL Final         Failed - HCT in normal range and within 360 days    HCT  Date Value Ref Range Status  07/21/2018 37.8 (L) 39.0 - 52.0 % Final  04/20/2017 29.8 (L) 38.7 - 49.9 % Final         Failed - Phenytoin (serum) in normal range and within 360 days    Phenytoin, Total  Date Value Ref Range Status  08/13/2014 6.4 (L) 10.0 - 20.0 mg/L Final   Phenytoin, Serum  Date Value Ref Range Status  08/03/2016 7.2 (L) 10.0 - 20.0 ug/mL Final    Comment:                                    Detection Limit =  0.8                           <0.8 Indicates None Detected    Phenytoin Lvl  Date Value Ref Range Status  06/01/2017 3.7 (L) 10.0 - 20.0 ug/mL Final   Phenytoin, Free  Date Value Ref Range Status  08/03/2016 0.8 (L) 1.0 - 2.0 ug/mL Final    Comment:                                    Detection Limit = 0.5   Phenytoin Bound  Date Value Ref Range Status  08/13/2014 5.9 mg/L Final         Failed - Valid encounter within last 12 months    Recent Outpatient Visits          6 months ago Acute upper respiratory infection   [facility], Ranell Patrick, MD    7 months ago Mass of left ear   [facility], Ranell Patrick, MD   1 year ago Congestive heart failure, unspecified HF chronicity, unspecified heart failure type [facility])   [facility], Ranell Patrick, MD   1 year ago Shortness of breath   [facility] [place], [place] D, PA-C   1 year ago Abscess, neck   [facility], Reather Laurence, PA-C      Future Appointments            In 1 week Patwardhan, Reynold Bowen, MD [facility]  Passed - ALT in normal range and within 360 days    ALT  Date Value Ref Range Status  07/21/2018 15 0 - 44 U/L Final   ALT(SGPT)  Date Value Ref Range Status  04/20/2017 27 10 - 47 U/L Final         Passed - AST in normal range and within 360 days    AST  Date Value Ref Range Status  07/21/2018 21 15 - 41 U/L Final         Passed - PLT in normal range and within 360 days    Platelets  Date Value Ref Range Status  04/20/2017 115 (L) 145 - 400 10e3/uL Final   Platelet Count  Date Value Ref Range Status  07/21/2018 164 150 - 400 K/uL Final   Platelet Count, POC  Date Value Ref Range Status  10/21/2015 182 142 - 424 K/uL Final         Passed - WBC in normal range and within 360 days    WBC  Date Value Ref Range Status  05/23/2018 11.5 (H) 4.0 - 10.5 K/uL Final   WBC Count  Date Value Ref Range Status  07/21/2018 7.6 4.0 - 10.5 K/uL Final

## 2018-10-18 NOTE — Telephone Encounter (Signed)
Spoke with patient and screened/ No symptoms °

## 2018-10-19 ENCOUNTER — Other Ambulatory Visit: Payer: Medicare Other

## 2018-10-19 ENCOUNTER — Ambulatory Visit: Payer: Medicare Other

## 2018-10-19 ENCOUNTER — Ambulatory Visit: Payer: Medicare Other | Admitting: Family

## 2018-10-19 ENCOUNTER — Other Ambulatory Visit: Payer: Self-pay | Admitting: *Deleted

## 2018-10-19 DIAGNOSIS — G40909 Epilepsy, unspecified, not intractable, without status epilepticus: Secondary | ICD-10-CM

## 2018-10-19 MED ORDER — PHENYTOIN SODIUM EXTENDED 300 MG PO CAPS: 300.0000 mg | ORAL_CAPSULE | Freq: Every day | ORAL | 0 refills | Status: DC

## 2018-10-25 ENCOUNTER — Ambulatory Visit: Payer: Self-pay | Admitting: Cardiology

## 2018-10-26 ENCOUNTER — Encounter: Payer: Self-pay | Admitting: Family Medicine

## 2018-10-30 ENCOUNTER — Telehealth (INDEPENDENT_AMBULATORY_CARE_PROVIDER_SITE_OTHER): Payer: Medicare Other | Admitting: Family Medicine

## 2018-10-30 ENCOUNTER — Other Ambulatory Visit: Payer: Self-pay

## 2018-10-30 DIAGNOSIS — G40909 Epilepsy, unspecified, not intractable, without status epilepticus: Secondary | ICD-10-CM | POA: Diagnosis not present

## 2018-10-30 MED ORDER — PHENYTOIN SODIUM EXTENDED 300 MG PO CAPS: 300.0000 mg | ORAL_CAPSULE | Freq: Every day | ORAL | 2 refills | Status: DC

## 2018-10-30 NOTE — Progress Notes (Signed)
CC- patient have no issues at this time. Patient just stasted he need a refill on Phenytoin sodium 300 mg.

## 2018-10-30 NOTE — Progress Notes (Signed)
Virtual Visit via Telephone Note  I connected with Gilbert [name] on 10/30/18 at 9:33 AM by telephone and verified that I am speaking with the correct person using two identifiers.   I discussed the limitations, risks, security and privacy concerns of performing an evaluation and management service by telephone and the availability of in person appointments. I also discussed with the patient that there Cellucci be a patient responsible charge related to this service. The patient expressed understanding and agreed to proceed, consent obtained  Chief complaint: dilantin refill.    History of Present Illness:  History of multiple medical problems per problem list below. Followed by cardiology, hematology, had excision of tumor from ear by ENT in October 2019.Marland Kitchen  Lab work reviewed from December 2019.  specific reason for visit today is refill of Dilantin.  Seizure disorder: Has been on Dilantin for some time.  Reported last seizure 10 to 15 years ago when discussed in July 2019, and has been continued on Dilantin 300 mg daily.  Previous phenytoin levels have been fairly low with of 3.7 in October 2018, but as he had been seizure-free, decided to remain at same level of medication.  We also discussed dental follow-up given chronic phenytoin use.  No seizures on current dose. No missed doses, no new side effects.  No recent dental visit.         Patient Active Problem List   Diagnosis Date Noted  . CHF (congestive heart failure) ([place]) 06/01/2017  . AKI (acute kidney injury) ([place]) 04/07/2017  . Orthostatic hypotension 04/07/2017  . 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 Hematuria 10/29/2015  . Dysphagia, pharyngoesophageal phase   . Early satiety   . Esophageal stricture   . Gastritis and gastroduodenitis   . PAD (peripheral artery disease) ([place]) 05/28/2014  .  Cardiomyopathy, ischemic 04/03/2014  . History of left heart catheterization (LHC) 02/26/2014  . Arterial bleed, intraoperative 02/26/2014  . Seizures ([place]) 11/27/2013  . HTN (hypertension) 11/27/2013  . Hypomagnesemia 11/27/2013  . CAD (coronary artery disease), native coronary artery 11/26/2013  . Renal cancer ([place]) 10/19/2013  . Coronary artery calcification 09/25/2013  . Renal cyst 08/30/2013  . Loss of weight 08/03/2013   Past Medical History:  Diagnosis Date  . Abnormal liver enzymes    Chronic alkaline phosphatase elevation since 2014  . Acid reflux   . CAD (coronary artery disease)   . Cancer ([place]) 08/2013   right kidney cancer  . Cancer ([place])   . Coronary artery calcification seen on CAT scan   . Coronary atherosclerosis of native coronary artery   . 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 Essential hypertension, benign   . Family history of anesthesia complication    mother-had stroke during anesthesia  . GERD (gastroesophageal reflux disease)   . Gout   . History of cardiac arrest 11/26/13   PTCA/Stenting of LM & Prox LAD with 4.0 x 18 mm Xience alpine stent. Used Impella Circulatory assist device. EF= 20-25%  . History of epilepsy    at least 10 years ago. 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 History of myocardial infarction less than 8 weeks    Stent placed  . History of nephrectomy 11/2013   For renal cell carcinoma  . History of tobacco use   . HTN (hypertension)   . Hypercholesteremia   .  Hyperlipemia   . Hypertension   . PAD (peripheral artery disease) ([place])   . Seizures ([place])   . Shortness of breath    with activity  . SOB (shortness of breath)   . Systolic and diastolic CHF, chronic (HCC)    Resolved. EF 45% 04/10/2014  . Therapeutic drug monitoring    Past Surgical History:  Procedure Laterality Date  . BALLOON DILATION N/A 01/21/2015   Procedure: BALLOON DILATION;   Surgeon: Gatha Mayer, MD;  Location: [facility] ENDOSCOPY;  Service: Endoscopy;  Laterality: N/A;  . CARDIAC CATHETERIZATION N/A 06/10/2015   Procedure: Left Heart Cath and Coronary Angiography;  Surgeon: Adrian Prows, MD;  Location: [facility] LAB;  Service: Cardiovascular;  Laterality: N/A;  . CORONARY ANGIOPLASTY WITH STENT PLACEMENT  2015   Left main coronary artery stenting on emergent basis along with circulatory support with Impella device through left leg. With 4.0 x 18 mm Xience alpine stent  . EAR CYST EXCISION Left 05/31/2018   Procedure: EXCISION LEFT POSTERIOR EAR TUMOR;  Surgeon: Leta Baptist, MD;  Location: [place];  Service: ENT;  Laterality: Left;  . ESOPHAGOGASTRODUODENOSCOPY    . ESOPHAGOGASTRODUODENOSCOPY N/A 01/21/2015   Procedure: ESOPHAGOGASTRODUODENOSCOPY (EGD) with possible dilation.;  Surgeon: Gatha Mayer, MD;  Location: [facility] ENDOSCOPY;  Service: Endoscopy;  Laterality: N/A;  . EYE SURGERY Left 2 weeks ago   torn retina  . ILIAC ARTERY STENT Left 05/28/2014   dr Einar Gip  . LEFT HEART CATHETERIZATION WITH CORONARY ANGIOGRAM N/A 11/27/2013   Procedure: LEFT HEART CATHETERIZATION WITH CORONARY ANGIOGRAM;  Surgeon: [name], MD;  Location: [facility] CATH LAB;  Service: Cardiovascular;  Laterality: N/A;  . LOWER EXTREMITY ANGIOGRAM N/A 02/26/2014   Procedure: LOWER EXTREMITY ANGIOGRAM;  Surgeon: [name], MD;  Location: [facility] CATH LAB;  Service: Cardiovascular;  Laterality: N/A;  . LOWER EXTREMITY ANGIOGRAM N/A 05/28/2014   Procedure: LOWER EXTREMITY ANGIOGRAM;  Surgeon: [name], MD;  Location: [facility] CATH LAB;  Service: Cardiovascular;  Laterality: N/A;  . NEPHRECTOMY  11/2013  . NO PAST SURGERIES    . PERCUTANEOUS CORONARY STENT INTERVENTION (PCI-S)  11/27/2013   Procedure: PERCUTANEOUS CORONARY STENT INTERVENTION (PCI-S);  Surgeon: [name], MD;  Location: [facility] CATH LAB;  Service: Cardiovascular;;  . ROBOT ASSISTED LAPAROSCOPIC NEPHRECTOMY Right 10/19/2013    Procedure: ROBOTIC ASSISTED LAPAROSCOPIC NEPHRECTOMY,  EXTENSIVE ADHESIOLYSIS;  Surgeon: Alexis Frock, MD;  Location: [facility] ORS;  Service: Urology;  Laterality: Right;  . SKIN FULL THICKNESS GRAFT Left 05/31/2018   Procedure: SKIN GRAFT FULL THICKNESS FROM LEFT ABDOMEN TO LEFT EAR;  Surgeon: Leta Baptist, MD;  Location: [place];  Service: ENT;  Laterality: Left;   Allergies  Allergen Reactions  . Oxycodone Swelling and Other (See Comments)    Tongue and lips swell   Prior to Admission medications   Medication Sig Start Date End Date Taking? Authorizing Provider  atorvastatin (LIPITOR) 80 MG tablet Take 80 mg by mouth daily. 09/05/15  Yes [provider]  calcium carbonate (TUMS - DOSED IN MG ELEMENTAL CALCIUM) 500 MG chewable tablet Chew 1 tablet by mouth 3 (three) times daily as needed for indigestion or heartburn.    Yes [provider]  carvedilol (COREG) 12.5 MG tablet Take 1 tablet (12.5 mg total) by mouth 2 (two) times daily with a meal. 06/03/17  Yes Patwardhan, Manish J, MD  cholecalciferol (VITAMIN D) 400 units TABS tablet Take 400 Units by mouth daily.   Yes [provider]  Cyanocobalamin (B-12) 5000 MCG CAPS Take 5,000 mcg by mouth every morning.    Yes [provider]  ENTRESTO 97-103 MG Take 1 tablet by mouth 2 (two) times daily. 02/23/18  Yes [provider]  famotidine (PEPCID) 20 MG tablet Take 20 mg by mouth 2 (two) times daily.   Yes [provider]  furosemide (LASIX) 40 MG tablet Take 1 tablet (40 mg total) by mouth 2 (two) times daily. Patient taking differently: Take 20 mg by mouth daily.  06/03/17  Yes Patwardhan, Manish J, MD  phenytoin (DILANTIN) 300 MG ER capsule Take 1 capsule (300 mg total) by mouth daily. 10/19/18  Yes Wendie Agreste, MD  pyridOXINE (VITAMIN B-6) 100 MG tablet Take 100 mg by mouth every morning.    Yes [provider]  spironolactone (ALDACTONE) 25 MG tablet Take 1 tablet (25 mg total) by mouth  daily. 06/03/17  Yes Patwardhan, Manish J, MD  Thiamine HCl (VITAMIN B-1) 250 MG tablet Take 250 mg by mouth every morning.    Yes [provider]   Social History   Socioeconomic History  . Marital status: Single    Spouse name: Not on file  . Number of children: Not on file  . Years of education: Not on file  . Highest education level: Not on file  Occupational History  . Not on file  Social Needs  . Financial resource strain: Not on file  . Food insecurity:    Worry: Not on file    Inability: Not on file  . Transportation needs:    Medical: Not on file    Non-medical: Not on file  Tobacco Use  . Smoking status: Former Smoker    Packs/day: 2.00    Years: 45.00    Pack years: 90.00    Types: Cigarettes    Last attempt to quit: 08/03/2003    Years since quitting: 15.2  . Smokeless tobacco: Current User    Types: Chew  . Tobacco comment: quit  smoking 10 years agop  Substance and Sexual Activity  . Alcohol use: No    Alcohol/week: 0.0 standard drinks    Comment: quit 1980  . Drug use: No    Comment: none in past 30 years   . Sexual activity: Not on file  Lifestyle  . Physical activity:    Days per week: Not on file    Minutes per session: Not on file  . Stress: Not on file  Relationships  . Social connections:    Talks on phone: Not on file    Gets together: Not on file    Attends religious service: Not on file    Active member of club or organization: Not on file    Attends meetings of clubs or organizations: Not on file    Relationship status: Not on file  . Intimate partner violence:    Fear of current or ex partner: Not on file    Emotionally abused: Not on file    Physically abused: Not on file    Forced sexual activity: Not on file  Other Topics Concern  . Not on file  Social History Narrative   ** Merged History Encounter **         Observations/Objective: No distress on phone.   Assessment and Plan: Seizure disorder ([place]) - Plan:  phenytoin (DILANTIN) 300 MG ER capsule  -Same dosing for some time, tolerating current regimen and no seizures.  We have discussed the relative low reading  in the past but again as no seizure, decided to remain on same dose.  Potential for breakthrough seizure discussed  -Recommended dental follow-up, specifically recommended every 3 months while on Dilantin.  -Most recent CMP, CBC reviewed from December.  No blood work at this time.  -Follow-up office visit in approximately 6 months to review meds and other medical issues as needed, but I am happy to see him sooner if needs arise.  Understanding expressed.  Follow Up Instructions:    I discussed the assessment and treatment plan with the patient. The patient was provided an opportunity to ask questions and all were answered. The patient agreed with the plan and demonstrated an understanding of the instructions.   The patient was advised to call back or seek an in-person evaluation if the symptoms worsen or if the condition fails to improve as anticipated.  I provided 6 minutes of non-face-to-face time during this encounter.  Signed,   [name], MD [facility].  10/30/18

## 2018-10-30 NOTE — Patient Instructions (Addendum)
  No change in dose of Dilantin at this time.  As we discussed, I do recommend dentist evaluation, which is typically recommended every 3 months while on Dilantin.  Lab work obtained in December overall looked stable.  Follow-up with me in 6 months for evaluation of ongoing medical issues and Dilantin at that time.  Please let me know if there are questions sooner.    If you have lab work done today you will be contacted with your lab results within the next 2 weeks.  If you have not heard from [place] then please contact us. The fastest way to get your results is to register for My Chart.   IF you received an x-ray today, you will receive an invoice from [facility]. Please contact [facility] at [phone] with questions or concerns regarding your invoice.   IF you received labwork today, you will receive an invoice from [facility]. Please contact [facility] at [phone] with questions or concerns regarding your invoice.   Our billing staff will not be able to assist you with questions regarding bills from these companies.  You will be contacted with the lab results as soon as they are available. The fastest way to get your results is to activate your My Chart account. Instructions are located on the last page of this paperwork. If you have not heard from [place] regarding the results in 2 weeks, please contact this office.

## 2018-11-17 ENCOUNTER — Other Ambulatory Visit: Payer: Self-pay | Admitting: Cardiology

## 2018-11-17 DIAGNOSIS — R0989 Other specified symptoms and signs involving the circulatory and respiratory systems: Secondary | ICD-10-CM

## 2018-11-28 ENCOUNTER — Inpatient Hospital Stay (HOSPITAL_BASED_OUTPATIENT_CLINIC_OR_DEPARTMENT_OTHER): Payer: Medicare Other | Admitting: Family

## 2018-11-28 ENCOUNTER — Inpatient Hospital Stay: Payer: Medicare Other

## 2018-11-28 ENCOUNTER — Inpatient Hospital Stay: Payer: Medicare Other | Attending: Hematology & Oncology

## 2018-11-28 ENCOUNTER — Encounter: Payer: Self-pay | Admitting: Family

## 2018-11-28 ENCOUNTER — Other Ambulatory Visit: Payer: Self-pay

## 2018-11-28 VITALS — Wt 170.0 lb

## 2018-11-28 VITALS — BP 145/61 | HR 64 | Temp 98.2°F | Resp 18

## 2018-11-28 DIAGNOSIS — C649 Malignant neoplasm of unspecified kidney, except renal pelvis: Secondary | ICD-10-CM

## 2018-11-28 DIAGNOSIS — C8589 Other specified types of non-Hodgkin lymphoma, extranodal and solid organ sites: Secondary | ICD-10-CM

## 2018-11-28 DIAGNOSIS — Z95828 Presence of other vascular implants and grafts: Secondary | ICD-10-CM

## 2018-11-28 DIAGNOSIS — Z79899 Other long term (current) drug therapy: Secondary | ICD-10-CM | POA: Diagnosis not present

## 2018-11-28 DIAGNOSIS — Z85528 Personal history of other malignant neoplasm of kidney: Secondary | ICD-10-CM | POA: Insufficient documentation

## 2018-11-28 DIAGNOSIS — Z905 Acquired absence of kidney: Secondary | ICD-10-CM

## 2018-11-28 DIAGNOSIS — Z8572 Personal history of non-Hodgkin lymphomas: Secondary | ICD-10-CM | POA: Insufficient documentation

## 2018-11-28 DIAGNOSIS — Z9221 Personal history of antineoplastic chemotherapy: Secondary | ICD-10-CM | POA: Insufficient documentation

## 2018-11-28 DIAGNOSIS — E876 Hypokalemia: Secondary | ICD-10-CM | POA: Insufficient documentation

## 2018-11-28 DIAGNOSIS — D6481 Anemia due to antineoplastic chemotherapy: Secondary | ICD-10-CM | POA: Diagnosis not present

## 2018-11-28 DIAGNOSIS — D509 Iron deficiency anemia, unspecified: Secondary | ICD-10-CM

## 2018-11-28 LAB — CBC WITH DIFFERENTIAL (CANCER CENTER ONLY)
Abs Immature Granulocytes: 0.02 10*3/uL (ref 0.00–0.07)
Basophils Absolute: 0.1 10*3/uL (ref 0.0–0.1)
Basophils Relative: 1 %
Eosinophils Absolute: 0.7 10*3/uL — ABNORMAL HIGH (ref 0.0–0.5)
Eosinophils Relative: 10 %
HCT: 34 % — ABNORMAL LOW (ref 39.0–52.0)
Hemoglobin: 11.8 g/dL — ABNORMAL LOW (ref 13.0–17.0)
Immature Granulocytes: 0 %
Lymphocytes Relative: 15 %
Lymphs Abs: 1.1 10*3/uL (ref 0.7–4.0)
MCH: 30 pg (ref 26.0–34.0)
MCHC: 34.7 g/dL (ref 30.0–36.0)
MCV: 86.5 fL (ref 80.0–100.0)
Monocytes Absolute: 0.4 10*3/uL (ref 0.1–1.0)
Monocytes Relative: 6 %
Neutro Abs: 4.9 10*3/uL (ref 1.7–7.7)
Neutrophils Relative %: 68 %
Platelet Count: 153 10*3/uL (ref 150–400)
RBC: 3.93 MIL/uL — ABNORMAL LOW (ref 4.22–5.81)
RDW: 13.1 % (ref 11.5–15.5)
WBC Count: 7.2 10*3/uL (ref 4.0–10.5)
nRBC: 0 % (ref 0.0–0.2)

## 2018-11-28 LAB — CMP (CANCER CENTER ONLY)
ALT: 10 U/L (ref 0–44)
AST: 20 U/L (ref 15–41)
Albumin: 3.5 g/dL (ref 3.5–5.0)
Alkaline Phosphatase: 114 U/L (ref 38–126)
Anion gap: 8 (ref 5–15)
BUN: 16 mg/dL (ref 8–23)
CO2: 29 mmol/L (ref 22–32)
Calcium: 8.5 mg/dL — ABNORMAL LOW (ref 8.9–10.3)
Chloride: 104 mmol/L (ref 98–111)
Creatinine: 1.29 mg/dL — ABNORMAL HIGH (ref 0.61–1.24)
GFR, Est AFR Am: >60 mL/min (ref >60)
GFR, Estimated: 57 mL/min — ABNORMAL LOW (ref >60)
Glucose, Bld: 111 mg/dL — ABNORMAL HIGH (ref 70–99)
Potassium: 2.7 mmol/L — CL (ref 3.5–5.1)
Sodium: 141 mmol/L (ref 135–145)
Total Bilirubin: 0.4 mg/dL (ref 0.3–1.2)
Total Protein: 5.8 g/dL — ABNORMAL LOW (ref 6.5–8.1)

## 2018-11-28 LAB — RETICULOCYTES
Immature Retic Fract: 6.1 % (ref 2.3–15.9)
RBC.: 3.95 MIL/uL — ABNORMAL LOW (ref 4.22–5.81)
Retic Count, Absolute: 45 10*3/uL (ref 19.0–186.0)
Retic Ct Pct: 1.1 % (ref 0.4–3.1)

## 2018-11-28 MED ORDER — SODIUM CHLORIDE 0.9% FLUSH
10.0000 mL | INTRAVENOUS | Status: DC | PRN
  Administered 2018-11-28: 15:00:00 10 mL via INTRAVENOUS
  Filled 2018-11-28: qty 10

## 2018-11-28 MED ORDER — POTASSIUM CHLORIDE CRYS ER 20 MEQ PO TBCR: EXTENDED_RELEASE_TABLET | ORAL | 0 refills | Status: DC

## 2018-11-28 MED ORDER — DENOSUMAB 120 MG/1.7ML ~~LOC~~ SOLN: 120.0000 mg | Freq: Once | SUBCUTANEOUS | Status: DC

## 2018-11-28 MED ORDER — DENOSUMAB 120 MG/1.7ML ~~LOC~~ SOLN
SUBCUTANEOUS | Status: AC
  Filled 2018-11-28: qty 1.7

## 2018-11-28 MED ORDER — HEPARIN SOD (PORK) LOCK FLUSH 100 UNIT/ML IV SOLN
500.0000 [IU] | Freq: Once | INTRAVENOUS | Status: AC
  Administered 2018-11-28: 500 [IU] via INTRAVENOUS
  Filled 2018-11-28: qty 5

## 2018-11-28 MED FILL — POTASSIUM CHLORIDE CRYS ER: 20 | 7 days supply | Qty: 10 | Fill #0

## 2018-11-28 NOTE — Patient Instructions (Signed)
Denosumab injection What is this medicine? DENOSUMAB (den oh sue mab) slows bone breakdown. Prolia is used to treat osteoporosis in women after menopause and in men, and in people who are taking corticosteroids for 6 months or more. Xgeva is used to treat a high calcium level due to cancer and to prevent bone fractures and other bone problems caused by multiple myeloma or cancer bone metastases. Xgeva is also used to treat giant cell tumor of the bone. This medicine Defoor be used for other purposes; ask your health care provider or pharmacist if you have questions. COMMON BRAND NAME(S): Prolia, XGEVA What should I tell my health care provider before I take this medicine? They need to know if you have any of these conditions: -dental disease -having surgery or tooth extraction -infection -kidney disease -low levels of calcium or Vitamin D in the blood -malnutrition -on hemodialysis -skin conditions or sensitivity -thyroid or parathyroid disease -an unusual reaction to denosumab, other medicines, foods, dyes, or preservatives -pregnant or trying to get pregnant -breast-feeding How should I use this medicine? This medicine is for injection under the skin. It is given by a health care professional in a hospital or clinic setting. A special MedGuide will be given to you before each treatment. Be sure to read this information carefully each time. For Prolia, talk to your pediatrician regarding the use of this medicine in children. Special care Cieslewicz be needed. For Xgeva, talk to your pediatrician regarding the use of this medicine in children. While this drug Vandevander be prescribed for children as young as 13 years for selected conditions, precautions do apply. Overdosage: If you think you have taken too much of this medicine contact a poison control center or emergency room at once. NOTE: This medicine is only for you. Do not share this medicine with others. What if I miss a dose? It is important not to  miss your dose. Call your doctor or health care professional if you are unable to keep an appointment. What Coupland interact with this medicine? Do not take this medicine with any of the following medications: -other medicines containing denosumab This medicine Netzel also interact with the following medications: -medicines that lower your chance of fighting infection -steroid medicines like prednisone or cortisone This list Loney not describe all possible interactions. Give your health care provider a list of all the medicines, herbs, non-prescription drugs, or dietary supplements you use. Also tell them if you smoke, drink alcohol, or use illegal drugs. Some items Zerkle interact with your medicine. What should I watch for while using this medicine? Visit your doctor or health care professional for regular checks on your progress. Your doctor or health care professional Parish order blood tests and other tests to see how you are doing. Call your doctor or health care professional for advice if you get a fever, chills or sore throat, or other symptoms of a cold or flu. Do not treat yourself. This drug Dauphinee decrease your body's ability to fight infection. Try to avoid being around people who are sick. You should make sure you get enough calcium and vitamin D while you are taking this medicine, unless your doctor tells you not to. Discuss the foods you eat and the vitamins you take with your health care professional. See your dentist regularly. Brush and floss your teeth as directed. Before you have any dental work done, tell your dentist you are receiving this medicine. Do not become pregnant while taking this medicine or for 5 months   after stopping it. Talk with your doctor or health care professional about your birth control options while taking this medicine. Women should inform their doctor if they wish to become pregnant or think they might be pregnant. There is a potential for serious side effects to an unborn  child. Talk to your health care professional or pharmacist for more information. What side effects Vallin I notice from receiving this medicine? Side effects that you should report to your doctor or health care professional as soon as possible: -allergic reactions like skin rash, itching or hives, swelling of the face, lips, or tongue -bone pain -breathing problems -dizziness -jaw pain, especially after dental work -redness, blistering, peeling of the skin -signs and symptoms of infection like fever or chills; cough; sore throat; pain or trouble passing urine -signs of low calcium like fast heartbeat, muscle cramps or muscle pain; pain, tingling, numbness in the hands or feet; seizures -unusual bleeding or bruising -unusually weak or tired Side effects that usually do not require medical attention (report to your doctor or health care professional if they continue or are bothersome): -constipation -diarrhea -headache -joint pain -loss of appetite -muscle pain -runny nose -tiredness -upset stomach This list Gabay not describe all possible side effects. Call your doctor for medical advice about side effects. You Clites report side effects to FDA at 1-800-FDA-1088. Where should I keep my medicine? This medicine is only given in a clinic, doctor's office, or other health care setting and will not be stored at home. NOTE: This sheet is a summary. It Shutter not cover all possible information. If you have questions about this medicine, talk to your doctor, pharmacist, or health care provider.  2019 Elsevier/Gold Standard (2017-11-25 16:10:44)

## 2018-11-28 NOTE — Patient Instructions (Signed)
Implanted Port Insertion, Care After  This sheet gives you information about how to care for yourself after your procedure. Your health care provider Willner also give you more specific instructions. If you have problems or questions, contact your health care provider.  What can I expect after the procedure?  After the procedure, it is common to have:  · Discomfort at the port insertion site.  · Bruising on the skin over the port. This should improve over 3-4 days.  Follow these instructions at home:  Port care  · After your port is placed, you will get a manufacturer's information card. The card has information about your port. Keep this card with you at all times.  · Take care of the port as told by your health care provider. Ask your health care provider if you or a family member can get training for taking care of the port at home. A home health care nurse Muzyka also take care of the port.  · Make sure to remember what type of port you have.  Incision care         · Follow instructions from your health care provider about how to take care of your port insertion site. Make sure you:  ? Wash your hands with soap and water before and after you change your bandage (dressing). If soap and water are not available, use hand sanitizer.  ? Change your dressing as told by your health care provider.  ? Leave stitches (sutures), skin glue, or adhesive strips in place. These skin closures Casino need to stay in place for 2 weeks or longer. If adhesive strip edges start to loosen and curl up, you Sydnor trim the loose edges. Do not remove adhesive strips completely unless your health care provider tells you to do that.  · Check your port insertion site every day for signs of infection. Check for:  ? Redness, swelling, or pain.  ? Fluid or blood.  ? Warmth.  ? Pus or a bad smell.  Activity  · Return to your normal activities as told by your health care provider. Ask your health care provider what activities are safe for you.  · Do not  lift anything that is heavier than 10 lb (4.5 kg), or the limit that you are told, until your health care provider says that it is safe.  General instructions  · Take over-the-counter and prescription medicines only as told by your health care provider.  · Do not take baths, swim, or use a hot tub until your health care provider approves. Ask your health care provider if you Mendia take showers. You Duva only be allowed to take sponge baths.  · Do not drive for 24 hours if you were given a sedative during your procedure.  · Wear a medical alert bracelet in case of an emergency. This will tell any health care providers that you have a port.  · Keep all follow-up visits as told by your health care provider. This is important.  Contact a health care provider if:  · You cannot flush your port with saline as directed, or you cannot draw blood from the port.  · You have a fever or chills.  · You have redness, swelling, or pain around your port insertion site.  · You have fluid or blood coming from your port insertion site.  · Your port insertion site feels warm to the touch.  · You have pus or a bad smell coming from the port   insertion site.  Get help right away if:  · You have chest pain or shortness of breath.  · You have bleeding from your port that you cannot control.  Summary  · Take care of the port as told by your health care provider. Keep the manufacturer's information card with you at all times.  · Change your dressing as told by your health care provider.  · Contact a health care provider if you have a fever or chills or if you have redness, swelling, or pain around your port insertion site.  · Keep all follow-up visits as told by your health care provider.  This information is not intended to replace advice given to you by your health care provider. Make sure you discuss any questions you have with your health care provider.  Document Released: 05/09/2013 Document Revised: 02/14/2018 Document Reviewed:  02/14/2018  Elsevier Interactive Patient Education © 2019 Elsevier Inc.

## 2018-11-28 NOTE — Progress Notes (Signed)
Hematology and Oncology Follow Up Visit  Gilbert [name] [id number] 11-Sep-1950 68 y.o. 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 He is doing well and has no complaints at this time. He is golfing 4 days a week and enjoying the beautiful weather.  His potassium is a low at 2.7. Calcium is also a little low at 8.5.  He has gout in his left great toe and states that he is eating cherries which has helped. He states that he had this same issue 10 years ago and effectively treated it the same way.  No fever, chills, n/v, cough, rash, dizziness, SOB, chest pain, palpitations, abdominal pain or changes in bowel or bladder habits.  No numbness or tingling in his extremities.  No falls or syncopal episodes.  No lymphadenopathy noted on exam.  No episodes of bleeding, no bruising or petechiae.  He has maintained a good appetite and is staying well hydrated. His weight is stable.   ECOG Performance Status: 0 - Asymptomatic  Medications:  Allergies as of 11/28/2018      Reactions   Oxycodone Swelling, Other (See Comments)   Tongue and lips swell      Medication List       Accurate as of November 28, 2018  2:52 PM. Always use your most recent med list.        atorvastatin 80 MG tablet Commonly known as:  LIPITOR Take 80 mg by mouth daily.   B-12 5000 MCG Caps Take 5,000 mcg by mouth every morning.   calcium carbonate 500 MG chewable tablet Commonly known as:  TUMS - dosed in mg elemental calcium Chew 1 tablet by mouth 3 (three) times daily as needed for indigestion or heartburn.   carvedilol 12.5 MG tablet Commonly known as:   COREG Take 1 tablet (12.5 mg total) by mouth 2 (two) times daily with a meal.   cholecalciferol 10 MCG (400 UNIT) Tabs tablet Commonly known as:  VITAMIN D3 Take 400 Units by mouth daily.   Entresto 97-103 MG Generic drug:  sacubitril-valsartan Take 1 tablet by mouth 2 (two) times daily.   famotidine 20 MG tablet Commonly known as:  PEPCID Take 20 mg by mouth 2 (two) times daily.   furosemide 40 MG tablet Commonly known as:  LASIX Take 1 tablet (40 mg total) by mouth 2 (two) times daily.   phenytoin 300 MG ER capsule Commonly known as:  DILANTIN Take 1 capsule (300 mg total) by mouth daily.   pyridOXINE 100 MG tablet Commonly known as:  VITAMIN B-6 Take 100 mg by mouth every morning.   spironolactone 25 MG tablet Commonly known as:  ALDACTONE Take 1 tablet (25 mg total) by mouth daily.   vitamin B-1 250 MG tablet Take 250 mg by mouth every morning.       Allergies:  Allergies  Allergen Reactions  . Oxycodone Swelling and Other (See Comments)    Tongue and lips swell    Past Medical History, Surgical history, Social history, and Family History were reviewed and updated.  Review of Systems: All other 10 point review of systems is negative.   Physical Exam:  vitals were not taken for this visit.   Wt Readings from Last 3 Encounters:  07/21/18 169 lb (76.7 kg)  05/31/18 164 lb 1.6 oz (74.4 kg)  05/23/18 164 lb 1.6 oz (74.4 kg)    Ocular: Sclerae unicteric, pupils equal, round and reactive to light Ear-nose-throat: Oropharynx clear, dentition fair Lymphatic: No cervical or supraclavicular adenopathy Lungs no rales or rhonchi, good excursion bilaterally Heart regular rate and rhythm, no murmur appreciated Abd soft, nontender, positive bowel sounds, no liver or spleen tip palpated on exam, no fluid wave  MSK no focal spinal tenderness, no joint edema Neuro: non-focal, well-oriented, appropriate affect Breasts: Deferred   Lab Results  Component Value Date    WBC 7.6 07/21/2018   HGB 12.4 (L) 07/21/2018   HCT 37.8 (L) 07/21/2018   MCV 91.7 07/21/2018   PLT 164 07/21/2018   Lab Results  Component Value Date   FERRITIN 85 07/21/2018   IRON 110 07/21/2018   TIBC 256 07/21/2018   UIBC 146 07/21/2018   IRONPCTSAT 43 07/21/2018   Lab Results  Component Value Date   RETICCTPCT 1.1 07/21/2018   RBC 4.12 (L) 07/21/2018   RBC 4.12 (L) 07/21/2018   No results found for: KPAFRELGTCHN, LAMBDASER, KAPLAMBRATIO No results found for: IGGSERUM, IGA, IGMSERUM No results found for: Odetta Pink, SPEI   Chemistry      Component Value Date/Time   NA 138 07/21/2018 0920   NA 140 04/20/2017 1417   K 3.9 07/21/2018 0920   K 3.6 04/20/2017 1417   CL 104 07/21/2018 0920   CL 109 (H) 04/20/2017 1417   CO2 27 07/21/2018 0920   CO2 27 04/20/2017 1417   BUN 26 (H) 07/21/2018 0920   BUN 15 04/20/2017 1417   CREATININE 1.49 (H) 07/21/2018 0920   CREATININE 1.3 (H) 04/20/2017 1417      Component Value Date/Time   CALCIUM 8.7 (L) 07/21/2018 0920   CALCIUM 8.8 04/20/2017 1417   ALKPHOS 136 (H) 07/21/2018 0920   ALKPHOS 104 (H) 04/20/2017 1417   AST 21 07/21/2018 0920   ALT 15 07/21/2018 0920   ALT 27 04/20/2017 1417   BILITOT 0.3 07/21/2018 0920       Impression and Plan: Gilbert [name] is a very pleasant 68 yo caucasian gentleman with history of 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 He completed 8 cycles of R-CEOP in November 2017.  He continues to do well and so far there has been no evidence of recurrence.  We will see what his iron studies show and bring him back in for infusion if needed.  His calcium is low today so Delton See was held this visit.  We will treat his low potassium with 40 meq KDUR PO today, 40 meq PO tomorrow and then 20 meq PO daily for another 5 days. I also  routed today's note and labs to his PCP for further evaluation.  We will recheck his lab in 2 weeks.  We will plan to see him back in another 3 months.  He will contact our office with any questions or concerns. We can certainly see him sooner if need be.   Laverna Peace, NP 4/28/20202:52 PM\

## 2018-11-29 LAB — FERRITIN: Ferritin: 86 ng/mL (ref 24–336)

## 2018-11-29 LAB — LACTATE DEHYDROGENASE: LDH: 165 U/L (ref 98–192)

## 2018-11-29 LAB — IRON AND TIBC
Iron: 101 ug/dL (ref 42–163)
Saturation Ratios: 48 % (ref 20–55)
TIBC: 211 ug/dL (ref 202–409)
UIBC: 110 ug/dL — ABNORMAL LOW (ref 117–376)

## 2018-11-30 ENCOUNTER — Telehealth: Payer: Self-pay | Admitting: Hematology & Oncology

## 2018-11-30 NOTE — Telephone Encounter (Signed)
Appointments scheduled letter/calendar mailed  °

## 2018-12-06 ENCOUNTER — Ambulatory Visit: Payer: Self-pay | Admitting: Cardiology

## 2018-12-07 ENCOUNTER — Other Ambulatory Visit: Payer: Self-pay

## 2018-12-07 ENCOUNTER — Ambulatory Visit (INDEPENDENT_AMBULATORY_CARE_PROVIDER_SITE_OTHER): Payer: Medicare Other | Admitting: Cardiology

## 2018-12-07 ENCOUNTER — Encounter: Payer: Self-pay | Admitting: Cardiology

## 2018-12-07 VITALS — Ht 70.0 in | Wt 167.0 lb

## 2018-12-07 DIAGNOSIS — I2510 Atherosclerotic heart disease of native coronary artery without angina pectoris: Secondary | ICD-10-CM

## 2018-12-07 DIAGNOSIS — I10 Essential (primary) hypertension: Secondary | ICD-10-CM | POA: Diagnosis not present

## 2018-12-07 MED ORDER — ATORVASTATIN CALCIUM 80 MG PO TABS: 80.0000 mg | ORAL_TABLET | Freq: Every day | ORAL | 3 refills | Status: DC

## 2018-12-07 MED ORDER — ASPIRIN EC 81 MG PO TBEC: 81.0000 mg | DELAYED_RELEASE_TABLET | Freq: Every day | ORAL | 3 refills | Status: DC

## 2018-12-07 NOTE — Progress Notes (Signed)
Telephone visit note  Subjective:   Gilbert [name], male    DOB: [birth year], 68 y.o.   MRN: [id number]   I connected with the patient on 12/07/18 by a telephone call and verified that I am speaking with the correct person using two identifiers.     I offered the patient a video enabled application for a virtual visit. Unfortunately, this could not be accomplished due to technical difficulties/lack of video enabled phone/computer. I discussed the limitations of evaluation and management by telemedicine and the availability of in person appointments. The patient expressed understanding and agreed to proceed.   This visit type was conducted due to national recommendations for restrictions regarding the COVID-19 Pandemic (e.g. social distancing).  This format is felt to be most appropriate for this patient at this time.  All issues noted in this document were discussed and addressed.  No physical exam was performed (except for noted visual exam findings with Tele health visits).  The patient has consented to conduct a Tele health visit and understands insurance will be billed.   Chief complaint:  Coronary artery disease   HPI  68 y/o Caucasian male with CAD s/p MI, LM stenting 2015, ischemic cardiomyopathy with recovery in EF to 55% on guideline directed medical therapy, PAD s/p Left external iliac/common femoral stenting 2015, h/o left nephrectomy for renal cell carcinoma, h/o NHL, currently in remission.  He is doing well. He denies chest pain, shortness of breath, palpitations, leg edema, orthopnea, PND, TIA/syncope.   Past Medical History:  Diagnosis Date  . Abnormal liver enzymes    Chronic alkaline phosphatase elevation since 2014  . Acid reflux   . CAD (coronary artery disease)   . Cancer ([place]) 08/2013   right kidney cancer  . Cancer ([place])   . Coronary artery calcification seen on CAT scan   . Coronary atherosclerosis of native coronary artery   . Diffuse large cell non-Hodgkin's  lymphoma ([place]) 01/07/2016  . Diffuse non-Hodgkin's lymphoma of bone ([place]) 01/08/2016  . Essential hypertension, benign   . Family history of anesthesia complication    mother-had stroke during anesthesia  . GERD (gastroesophageal reflux disease)   . Gout   . History of cardiac arrest 11/26/13   PTCA/Stenting of LM & Prox LAD with 4.0 x 18 mm Xience alpine stent. Used Impella Circulatory assist device. EF= 20-25%  . History of epilepsy    at least 10 years ago. 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 History of myocardial infarction less than 8 weeks    Stent placed  . History of nephrectomy 11/2013   For renal cell carcinoma  . History of tobacco use   . HTN (hypertension)   . Hypercholesteremia   . Hyperlipemia   . Hypertension   . PAD (peripheral artery disease) ([place])   . Seizures ([place])   . Shortness of breath    with activity  . SOB (shortness of breath)   . Systolic and diastolic CHF, chronic (H[facility])    Resolved. EF 45% 04/10/2014  . Therapeutic drug monitoring      Past Surgical History:  Procedure Laterality Date  . BALLOON DILATION N/A 01/21/2015   Procedure: BALLOON DILATION;  Surgeon: Gatha Mayer, MD;  Location: [facility] ENDOSCOPY;  Service: Endoscopy;  Laterality: N/A;  . CARDIAC CATHETERIZATION N/A 06/10/2015   Procedure: Left Heart Cath and Coronary Angiography;  Surgeon: Adrian Prows, MD;  Location: [facility];  Service: Cardiovascular;  Laterality: N/A;  . CORONARY ANGIOPLASTY WITH STENT PLACEMENT  2015   Left main coronary artery stenting on emergent basis along with circulatory support with Impella device through left leg. With 4.0 x 18 mm Xience alpine stent  . EAR CYST EXCISION Left 05/31/2018   Procedure: EXCISION LEFT POSTERIOR EAR TUMOR;  Surgeon: Leta Baptist, MD;  Location: [place];  Service: ENT;  Laterality: Left;  . ESOPHAGOGASTRODUODENOSCOPY    . ESOPHAGOGASTRODUODENOSCOPY N/A 01/21/2015   Procedure: ESOPHAGOGASTRODUODENOSCOPY (EGD) with possible dilation.;  Surgeon:  Gatha Mayer, MD;  Location: [facility] END[facility]COPY;  Service: Endoscopy;  Laterality: N/A;  . EYE SURGERY Left 2 weeks ago   torn retina  . ILIAC ARTERY STENT Left 05/28/2014   dr Einar Gip  . LEFT HEART CATHETERIZATION WITH CORONARY ANGIOGRAM N/A 11/27/2013   Procedure: LEFT HEART CATHETERIZATION WITH CORONARY ANGIOGRAM;  Surgeon: [name], MD;  Location: [facility] CATH LAB;  Service: Cardiovascular;  Laterality: N/A;  . LOWER EXTREMITY ANGIOGRAM N/A 02/26/2014   Procedure: LOWER EXTREMITY ANGIOGRAM;  Surgeon: [name], MD;  Location: [facility] CATH LAB;  Service: Cardiovascular;  Laterality: N/A;  . LOWER EXTREMITY ANGIOGRAM N/A 05/28/2014   Procedure: LOWER EXTREMITY ANGIOGRAM;  Surgeon: [name], MD;  Location: [facility] CATH LAB;  Service: Cardiovascular;  Laterality: N/A;  . NEPHRECTOMY  11/2013  . NO PAST SURGERIES    . PERCUTANEOUS CORONARY STENT INTERVENTION (PCI-S)  11/27/2013   Procedure: PERCUTANEOUS CORONARY STENT INTERVENTION (PCI-S);  Surgeon: [name], MD;  Location: [facility] CATH LAB;  Service: Cardiovascular;;  . ROBOT ASSISTED LAPAROSCOPIC NEPHRECTOMY Right 10/19/2013   Procedure: ROBOTIC ASSISTED LAPAROSCOPIC NEPHRECTOMY,  EXTENSIVE ADHESIOLYSIS;  Surgeon: Alexis Frock, MD;  Location: [facility] ORS;  Service: Urology;  Laterality: Right;  . SKIN FULL THICKNESS GRAFT Left 05/31/2018   Procedure: SKIN GRAFT FULL THICKNESS FROM LEFT ABDOMEN TO LEFT EAR;  Surgeon: Leta Baptist, MD;  Location: [facility] OR;  Service: ENT;  Laterality: Left;     Social History   Socioeconomic History  . Marital status: Single    Spouse name: Not on file  . Number of children: Not on file  . Years of education: Not on file  . Highest education level: Not on file  Occupational History  . Not on file  Social Needs  . Financial resource strain: Not on file  . Food insecurity:    Worry: Not on file    Inability: Not on file  . Transportation needs:    Medical: Not on file    Non-medical: Not on file   Tobacco Use  . Smoking status: Former Smoker    Packs/day: 2.00    Years: 45.00    Pack years: 90.00    Types: Cigarettes    Last attempt to quit: 08/03/2003    Years since quitting: 15.3  . Smokeless tobacco: Current User    Types: Chew  . Tobacco comment: quit  smoking 10 years agop  Substance and Sexual Activity  . Alcohol use: No    Alcohol/week: 0.0 standard drinks    Comment: quit 1980  . Drug use: No    Comment: none in past 30 years   . Sexual activity: Not on file  Lifestyle  . Physical activity:    Days per week: Not on file    Minutes per session: Not on file  . Stress: Not on file  Relationships  . Social connections:    Talks on phone: Not on file    Gets together: Not on file    Attends religious service: Not on  file    Active member of club or organization: Not on file    Attends meetings of clubs or organizations: Not on file    Relationship status: Not on file  . Intimate partner violence:    Fear of current or ex partner: Not on file    Emotionally abused: Not on file    Physically abused: Not on file    Forced sexual activity: Not on file  Other Topics Concern  . Not on file  Social History Narrative   ** Merged History Encounter **         Family History  Problem Relation Age of Onset  . Stroke Mother   . Heart disease Brother        multiple stents  . Hyperlipidemia Brother   . Emphysema Father      Current Outpatient Medications on File Prior to Visit  Medication Sig Dispense Refill  . atorvastatin (LIPITOR) 80 MG tablet Take 80 mg by mouth daily.  3  . calcium carbonate (TUMS - DOSED IN MG ELEMENTAL CALCIUM) 500 MG chewable tablet Chew 1 tablet by mouth 3 (three) times daily as needed for indigestion or heartburn.     . carvedilol (COREG) 12.5 MG tablet Take 1 tablet (12.5 mg total) by mouth 2 (two) times daily with a meal. 90 tablet 3  . cholecalciferol (VITAMIN D) 400 units TABS tablet Take 400 Units by mouth daily.    .  Cyanocobalamin (B-12) 5000 MCG CAPS Take 5,000 mcg by mouth every morning.     Marland Kitchen ENTRESTO 97-103 MG Take 1 tablet by mouth 2 (two) times daily.  3  . famotidine (PEPCID) 20 MG tablet Take 20 mg by mouth 2 (two) times daily.    . furosemide (LASIX) 40 MG tablet Take 1 tablet (40 mg total) by mouth 2 (two) times daily. (Patient taking differently: Take 20 mg by mouth daily. ) 60 tablet 6  . phenytoin (DILANTIN) 300 MG ER capsule Take 1 capsule (300 mg total) by mouth daily. 90 capsule 2  . potassium chloride SA (K-DUR) 20 MEQ tablet 2 tabs tonight and 2 tabs tomorrow morning then 1 tab daily x 5 days. 10 tablet 0  . pyridOXINE (VITAMIN B-6) 100 MG tablet Take 100 mg by mouth every morning.     Marland Kitchen spironolactone (ALDACTONE) 25 MG tablet Take 1 tablet (25 mg total) by mouth daily. 60 tablet 6  . Thiamine HCl (VITAMIN B-1) 250 MG tablet Take 250 mg by mouth every morning.      Current Facility-Administered Medications on File Prior to Visit  Medication Dose Route Frequency Provider Last Rate Last Dose  . sodium chloride flush (NS) 0.9 % injection 10 mL  10 mL Intravenous PRN Volanda Napoleon, MD   10 mL at 05/05/16 1127  . sodium chloride flush (NS) 0.9 % injection 10 mL  10 mL Intracatheter PRN Volanda Napoleon, MD   10 mL at 06/10/16 1311    Cardiovascular studies:  EKG 04/26/2018: Sinus rhythm 56 bpm. Incomplete LBBB  Echocardiogram 10/11/2017: Left ventricle cavity is normal in size. Moderate concentric hypertrophy of the left ventricle. Low normal LV systolic function. Visual EF is 50-55%. Doppler evidence of grade I (impaired) diastolic dysfunction, indeterminate LAP. Left ventricle regional wall motion findings: No wall motion abnormalities. Calculated EF 46%. Mild tricuspid regurgitation. Borderline pulmonary hypertension, PA pressure estimated at 31 mm Hg. Compared to the study done on 03/30/2017, LV EF is improved from 35% to the present  50-55%. Compared to hospital echo 06/02/17, EF  improved from 20%. RV function was previously mildly reduced and small pericardial effusion and left pleural effusion not present.  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 Right common carotid artery and right external carotid artery stenosis of (<50%). Stenosis in the left internal carotid artery (16-49%).  Antegrade right vertebral artery flow. Antegrade left vertebral artery flow. Compared to the study done on 01/27/2018,  right ICA stenosis is new.  Left ICA stenosis was >50%. F/U in 6 months.  Lexiscan thalium stress test 06/09/2017: 1. Prognostically, this is a high risk study. Resting EKG demonstrates ectopic atrial rhythm and sinus rhythm with LVH with repolarization, cannot exclude lateral ischemia. Stress EKG is nondiagnostic for ischemia as is the pharmacologic stress test. Stress symptoms included dyspnea and dizziness. 2. There is a mild area of ischemia of small size in the septal wall.  There is very large scar of the inferolateral segment with very mild residual ischemia. 3. Compared to the prior study dated 05/09/2015, the current study now reveals Inferior scar with mild lateral ischemia new. EF has decreaed from 30% to 17%.  Recent labs: 04/20/2018:  Cholesterol 174, triglycerides 120, HDL 61, LDL 89.  Creatinine 1.37, EGFR 53/61, sodium 145, potassium 4.3, BMP otherwise normal.  03/07/2018: H/H 11/32. MCV 94. Platelets 113. Glucose 111. BUN/Cr 22/1.5. eGFR 53. Na/K 138/4.1 Iron studies normal   Review of Systems  Constitution: Negative for decreased appetite, malaise/fatigue, weight gain and weight loss.  HENT: Negative for congestion.   Eyes: Negative for visual disturbance.  Cardiovascular: Negative for chest pain, dyspnea on exertion, leg swelling, palpitations and syncope.  Respiratory: Negative for shortness of breath.   Endocrine: Negative for cold intolerance.  Hematologic/Lymphatic: Does not bruise/bleed easily.  Skin: Negative  for itching and rash.  Musculoskeletal: Negative for myalgias.  Gastrointestinal: Negative for abdominal pain, nausea and vomiting.  Genitourinary: Negative for dysuria.  Neurological: Negative for dizziness and weakness.  Psychiatric/Behavioral: The patient is not nervous/anxious.   All other systems reviewed and are negative.        There were no vitals filed for this visit. (Measured by the patient using a home BP monitor)  Physical Exam: Not performed, as this is a telephone visit      Assessment & Recommendations:  68 y/o Caucasian male with CAD s/p MI, LM stenting 2015, ischemic cardiomyopathy with recovery in EF to 55% on guideline directed medical therapy, PAD s/p Left external iliac/common femoral stenting 2015, h/o left nephrectomy for renal cell carcinoma, h/o NHL, currently in remission.  Iscehmic cardiomyopathy: EF recoverred to 50-55% on guiideline directed medical thrapy. Conintue coreg 12.5 mg bid, Entresto 97-103 mg bid Not on spironolactone due to improved NYHA symptoms and gynacomastia from spironolactone. Lasix as needed  CAD: Stable. No angina symptoms. Stop plavix given ongoing use of dilantin. Resumed aspirin 81 mg daily. Refilled Lipitor.   PAD: Stable.  I will see him back in 3 months after lipid panel and BMP.  Nigel Mormon, MD [facility] Pager: [phone] Office: [phone] If no answer Cell [phone]

## 2018-12-12 ENCOUNTER — Other Ambulatory Visit: Payer: Medicare Other

## 2018-12-12 ENCOUNTER — Other Ambulatory Visit: Payer: Self-pay

## 2018-12-12 DIAGNOSIS — C8589 Other specified types of non-Hodgkin lymphoma, extranodal and solid organ sites: Secondary | ICD-10-CM

## 2018-12-13 ENCOUNTER — Other Ambulatory Visit: Payer: Self-pay

## 2018-12-13 ENCOUNTER — Inpatient Hospital Stay: Payer: Medicare Other | Attending: Hematology & Oncology

## 2018-12-13 DIAGNOSIS — Z85528 Personal history of other malignant neoplasm of kidney: Secondary | ICD-10-CM | POA: Insufficient documentation

## 2018-12-13 DIAGNOSIS — Z9221 Personal history of antineoplastic chemotherapy: Secondary | ICD-10-CM | POA: Diagnosis not present

## 2018-12-13 DIAGNOSIS — Z8572 Personal history of non-Hodgkin lymphomas: Secondary | ICD-10-CM | POA: Diagnosis not present

## 2018-12-13 DIAGNOSIS — C8589 Other specified types of non-Hodgkin lymphoma, extranodal and solid organ sites: Secondary | ICD-10-CM

## 2018-12-13 LAB — CMP (CANCER CENTER ONLY)
ALT: 12 U/L (ref 0–44)
AST: 21 U/L (ref 15–41)
Albumin: 3.7 g/dL (ref 3.5–5.0)
Alkaline Phosphatase: 108 U/L (ref 38–126)
Anion gap: 6 (ref 5–15)
BUN: 16 mg/dL (ref 8–23)
CO2: 29 mmol/L (ref 22–32)
Calcium: 9 mg/dL (ref 8.9–10.3)
Chloride: 108 mmol/L (ref 98–111)
Creatinine: 1.34 mg/dL — ABNORMAL HIGH (ref 0.61–1.24)
GFR, Est AFR Am: >60 mL/min (ref >60)
GFR, Estimated: 54 mL/min — ABNORMAL LOW (ref >60)
Glucose, Bld: 107 mg/dL — ABNORMAL HIGH (ref 70–99)
Potassium: 4.1 mmol/L (ref 3.5–5.1)
Sodium: 143 mmol/L (ref 135–145)
Total Bilirubin: 0.4 mg/dL (ref 0.3–1.2)
Total Protein: 5.9 g/dL — ABNORMAL LOW (ref 6.5–8.1)

## 2018-12-13 LAB — CBC WITH DIFFERENTIAL (CANCER CENTER ONLY)
Abs Immature Granulocytes: 0.01 10*3/uL (ref 0.00–0.07)
Basophils Absolute: 0.1 10*3/uL (ref 0.0–0.1)
Basophils Relative: 1 %
Eosinophils Absolute: 0.6 10*3/uL — ABNORMAL HIGH (ref 0.0–0.5)
Eosinophils Relative: 9 %
HCT: 36.5 % — ABNORMAL LOW (ref 39.0–52.0)
Hemoglobin: 12.5 g/dL — ABNORMAL LOW (ref 13.0–17.0)
Immature Granulocytes: 0 %
Lymphocytes Relative: 19 %
Lymphs Abs: 1.3 10*3/uL (ref 0.7–4.0)
MCH: 30.3 pg (ref 26.0–34.0)
MCHC: 34.2 g/dL (ref 30.0–36.0)
MCV: 88.6 fL (ref 80.0–100.0)
Monocytes Absolute: 0.5 10*3/uL (ref 0.1–1.0)
Monocytes Relative: 7 %
Neutro Abs: 4.5 10*3/uL (ref 1.7–7.7)
Neutrophils Relative %: 64 %
Platelet Count: 149 10*3/uL — ABNORMAL LOW (ref 150–400)
RBC: 4.12 MIL/uL — ABNORMAL LOW (ref 4.22–5.81)
RDW: 13.2 % (ref 11.5–15.5)
WBC Count: 7 10*3/uL (ref 4.0–10.5)
nRBC: 0 % (ref 0.0–0.2)

## 2019-01-10 ENCOUNTER — Other Ambulatory Visit: Payer: Self-pay

## 2019-01-10 ENCOUNTER — Inpatient Hospital Stay: Payer: Medicare Other | Attending: Hematology & Oncology

## 2019-01-10 DIAGNOSIS — Z8572 Personal history of non-Hodgkin lymphomas: Secondary | ICD-10-CM | POA: Diagnosis present

## 2019-01-10 DIAGNOSIS — C8330 Diffuse large B-cell lymphoma, unspecified site: Secondary | ICD-10-CM

## 2019-01-10 DIAGNOSIS — Z452 Encounter for adjustment and management of vascular access device: Secondary | ICD-10-CM | POA: Diagnosis present

## 2019-01-10 MED ORDER — HEPARIN SOD (PORK) LOCK FLUSH 100 UNIT/ML IV SOLN
500.0000 [IU] | Freq: Once | INTRAVENOUS | Status: AC
  Administered 2019-01-10: 500 [IU] via INTRAVENOUS
  Filled 2019-01-10: qty 5

## 2019-01-10 MED ORDER — SODIUM CHLORIDE 0.9% FLUSH
10.0000 mL | INTRAVENOUS | Status: DC | PRN
  Administered 2019-01-10: 16:00:00 10 mL via INTRAVENOUS
  Filled 2019-01-10: qty 10

## 2019-01-10 NOTE — Patient Instructions (Signed)

## 2019-02-08 ENCOUNTER — Other Ambulatory Visit: Payer: Self-pay

## 2019-02-08 ENCOUNTER — Encounter: Payer: Self-pay | Admitting: Family Medicine

## 2019-02-08 ENCOUNTER — Ambulatory Visit (INDEPENDENT_AMBULATORY_CARE_PROVIDER_SITE_OTHER): Payer: Medicare Other

## 2019-02-08 DIAGNOSIS — R0989 Other specified symptoms and signs involving the circulatory and respiratory systems: Secondary | ICD-10-CM

## 2019-03-07 ENCOUNTER — Inpatient Hospital Stay: Payer: Medicare Other | Attending: Hematology & Oncology | Admitting: Hematology & Oncology

## 2019-03-07 ENCOUNTER — Encounter: Payer: Self-pay | Admitting: Hematology & Oncology

## 2019-03-07 ENCOUNTER — Other Ambulatory Visit: Payer: Self-pay

## 2019-03-07 ENCOUNTER — Inpatient Hospital Stay: Payer: Medicare Other

## 2019-03-07 VITALS — BP 160/61 | HR 59 | Temp 97.7°F | Resp 18 | Wt 170.0 lb

## 2019-03-07 DIAGNOSIS — Z85528 Personal history of other malignant neoplasm of kidney: Secondary | ICD-10-CM | POA: Diagnosis not present

## 2019-03-07 DIAGNOSIS — C8589 Other specified types of non-Hodgkin lymphoma, extranodal and solid organ sites: Secondary | ICD-10-CM

## 2019-03-07 DIAGNOSIS — D509 Iron deficiency anemia, unspecified: Secondary | ICD-10-CM

## 2019-03-07 DIAGNOSIS — M170 Bilateral primary osteoarthritis of knee: Secondary | ICD-10-CM | POA: Diagnosis not present

## 2019-03-07 DIAGNOSIS — Z7982 Long term (current) use of aspirin: Secondary | ICD-10-CM | POA: Diagnosis not present

## 2019-03-07 DIAGNOSIS — C8330 Diffuse large B-cell lymphoma, unspecified site: Secondary | ICD-10-CM

## 2019-03-07 DIAGNOSIS — C8580 Other specified types of non-Hodgkin lymphoma, unspecified site: Secondary | ICD-10-CM | POA: Diagnosis not present

## 2019-03-07 DIAGNOSIS — Z452 Encounter for adjustment and management of vascular access device: Secondary | ICD-10-CM | POA: Diagnosis not present

## 2019-03-07 DIAGNOSIS — Z8572 Personal history of non-Hodgkin lymphomas: Secondary | ICD-10-CM | POA: Insufficient documentation

## 2019-03-07 DIAGNOSIS — Z9221 Personal history of antineoplastic chemotherapy: Secondary | ICD-10-CM | POA: Diagnosis not present

## 2019-03-07 DIAGNOSIS — Z79899 Other long term (current) drug therapy: Secondary | ICD-10-CM | POA: Insufficient documentation

## 2019-03-07 DIAGNOSIS — T451X5A Adverse effect of antineoplastic and immunosuppressive drugs, initial encounter: Secondary | ICD-10-CM | POA: Diagnosis not present

## 2019-03-07 DIAGNOSIS — D6481 Anemia due to antineoplastic chemotherapy: Secondary | ICD-10-CM | POA: Insufficient documentation

## 2019-03-07 DIAGNOSIS — Z905 Acquired absence of kidney: Secondary | ICD-10-CM | POA: Diagnosis not present

## 2019-03-07 DIAGNOSIS — E876 Hypokalemia: Secondary | ICD-10-CM

## 2019-03-07 LAB — CBC WITH DIFFERENTIAL (CANCER CENTER ONLY)
Abs Immature Granulocytes: 0.02 10*3/uL (ref 0.00–0.07)
Basophils Absolute: 0.1 10*3/uL (ref 0.0–0.1)
Basophils Relative: 1 %
Eosinophils Absolute: 0.6 10*3/uL — ABNORMAL HIGH (ref 0.0–0.5)
Eosinophils Relative: 9 %
HCT: 35.3 % — ABNORMAL LOW (ref 39.0–52.0)
Hemoglobin: 12 g/dL — ABNORMAL LOW (ref 13.0–17.0)
Immature Granulocytes: 0 %
Lymphocytes Relative: 17 %
Lymphs Abs: 1.1 10*3/uL (ref 0.7–4.0)
MCH: 30.3 pg (ref 26.0–34.0)
MCHC: 34 g/dL (ref 30.0–36.0)
MCV: 89.1 fL (ref 80.0–100.0)
Monocytes Absolute: 0.5 10*3/uL (ref 0.1–1.0)
Monocytes Relative: 8 %
Neutro Abs: 4.2 10*3/uL (ref 1.7–7.7)
Neutrophils Relative %: 65 %
Platelet Count: 142 10*3/uL — ABNORMAL LOW (ref 150–400)
RBC: 3.96 MIL/uL — ABNORMAL LOW (ref 4.22–5.81)
RDW: 13.3 % (ref 11.5–15.5)
WBC Count: 6.6 10*3/uL (ref 4.0–10.5)
nRBC: 0 % (ref 0.0–0.2)

## 2019-03-07 LAB — CMP (CANCER CENTER ONLY)
ALT: 17 U/L (ref 0–44)
AST: 26 U/L (ref 15–41)
Albumin: 3.6 g/dL (ref 3.5–5.0)
Alkaline Phosphatase: 104 U/L (ref 38–126)
Anion gap: 6 (ref 5–15)
BUN: 15 mg/dL (ref 8–23)
CO2: 29 mmol/L (ref 22–32)
Calcium: 8.8 mg/dL — ABNORMAL LOW (ref 8.9–10.3)
Chloride: 107 mmol/L (ref 98–111)
Creatinine: 1.3 mg/dL — ABNORMAL HIGH (ref 0.61–1.24)
GFR, Est AFR Am: >60 mL/min (ref >60)
GFR, Estimated: 56 mL/min — ABNORMAL LOW (ref >60)
Glucose, Bld: 80 mg/dL (ref 70–99)
Potassium: 3.8 mmol/L (ref 3.5–5.1)
Sodium: 142 mmol/L (ref 135–145)
Total Bilirubin: 0.3 mg/dL (ref 0.3–1.2)
Total Protein: 5.8 g/dL — ABNORMAL LOW (ref 6.5–8.1)

## 2019-03-07 LAB — RETICULOCYTES
Immature Retic Fract: 6.8 % (ref 2.3–15.9)
RBC.: 4 MIL/uL — ABNORMAL LOW (ref 4.22–5.81)
Retic Count, Absolute: 45.2 10*3/uL (ref 19.0–186.0)
Retic Ct Pct: 1.1 % (ref 0.4–3.1)

## 2019-03-07 MED ORDER — DENOSUMAB 120 MG/1.7ML ~~LOC~~ SOLN
SUBCUTANEOUS | Status: AC
  Filled 2019-03-07: qty 1.7

## 2019-03-07 MED ORDER — HEPARIN SOD (PORK) LOCK FLUSH 100 UNIT/ML IV SOLN
500.0000 [IU] | Freq: Once | INTRAVENOUS | Status: AC
  Administered 2019-03-07: 500 [IU] via INTRAVENOUS
  Filled 2019-03-07: qty 5

## 2019-03-07 MED ORDER — SODIUM CHLORIDE 0.9% FLUSH
10.0000 mL | INTRAVENOUS | Status: DC | PRN
  Administered 2019-03-07: 10 mL via INTRAVENOUS
  Filled 2019-03-07: qty 10

## 2019-03-07 MED ORDER — DENOSUMAB 120 MG/1.7ML ~~LOC~~ SOLN
120.0000 mg | Freq: Once | SUBCUTANEOUS | Status: AC
  Administered 2019-03-07: 16:00:00 120 mg via SUBCUTANEOUS

## 2019-03-07 NOTE — Addendum Note (Signed)
Addended by: Cottie Banda on: 03/07/2019 04:13 PM   Modules accepted: Orders

## 2019-03-07 NOTE — Progress Notes (Signed)
4:07 PM Instructed to take TUMS 3 times daily. Verbalized understanding.

## 2019-03-07 NOTE — Progress Notes (Signed)
Hematology and Oncology Follow Up Visit  Gilbert [name] [id number] [birth year] 68 y.o. 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  As expected, he is doing quite well.  He just got off the golf course.  He plays golf all the time.  I am glad that he is able to do this.  He does have problems with his knees.  This is more arthritic than anything else.  He has not lost any weight.  In fact, he has gained some weight.  His appetite is good.  He has had no nausea or vomiting.  There is no headache.  He has had no swallowing difficulties.  He has had no bleeding or bruising.  There is been no issues with rashes.  He does have some vitiligo which is been chronic.  Cardiac wise he is doing quite well.  I do not think he sees his cardiologist all that much.  I do not see anything that would look like an exacerbation of congestive heart failure.  Overall, his performance status is ECOG 0.   Medications:  Allergies as of 03/07/2019      Reactions   Oxycodone Swelling, Other (See Comments)   Tongue and lips swell      Medication List       Accurate as of March 07, 2019  4:08 PM. If you have any questions, ask your nurse or doctor.        aspirin EC 81 MG tablet Take 1 tablet (81 mg total) by mouth daily.   atorvastatin 80 MG tablet Commonly known as: LIPITOR Take 1 tablet (80 mg total) by mouth daily.   B-12 5000 MCG Caps Take 5,000 mcg by mouth every morning.   calcium carbonate 500 MG chewable tablet Commonly known as: TUMS - dosed in mg elemental calcium Chew 1 tablet by mouth 3 (three) times daily as  needed for indigestion or heartburn.   carvedilol 12.5 MG tablet Commonly known as: COREG Take 1 tablet (12.5 mg total) by mouth 2 (two) times daily with a meal.   cholecalciferol 10 MCG (400 UNIT) Tabs tablet Commonly known as: VITAMIN D3 Take 400 Units by mouth daily.   clopidogrel 75 MG tablet Commonly known as: PLAVIX Take 75 mg by mouth daily.   Entresto 97-103 MG Generic drug: sacubitril-valsartan Take 1 tablet by mouth 2 (two) times daily.   famotidine 20 MG tablet Commonly known as: PEPCID Take 20 mg by mouth 2 (two) times daily.   furosemide 40 MG tablet Commonly known as: LASIX Take 1 tablet (40 mg total) by mouth 2 (two) times daily. What changed:   how much to take  when to take this   phenytoin 300 MG ER capsule Commonly known as: DILANTIN Take 1 capsule (300 mg total) by mouth daily.   potassium chloride SA 20 MEQ tablet Commonly known as: K-DUR 2 tabs tonight and 2 tabs tomorrow morning then 1 tab daily x 5 days.   pyridOXINE 100 MG tablet Commonly known as: VITAMIN B-6 Take 100 mg by mouth every morning.   spironolactone 25 MG tablet Commonly known as: ALDACTONE  Take 1 tablet (25 mg total) by mouth daily.   vitamin B-1 250 MG tablet Take 250 mg by mouth every morning.       Allergies:  Allergies  Allergen Reactions  . Oxycodone Swelling and Other (See Comments)    Tongue and lips swell    Past Medical History, Surgical history, Social history, and Family History were reviewed and updated.  Review of Systems: Review of Systems  Constitutional: Negative.   HENT: Negative.   Eyes: Negative.   Respiratory: Negative.   Cardiovascular: Negative.   Gastrointestinal: Negative.   Genitourinary: Negative.   Musculoskeletal: Negative.   Skin: Negative.   Neurological: Negative.   Endo/Heme/Allergies: Negative.   Psychiatric/Behavioral: Negative.      Physical Exam:  vitals were not taken for this visit.   Wt Readings from Last 3  Encounters:  03/07/19 170 lb (77.1 kg)  12/07/18 167 lb (75.8 kg)  11/28/18 170 lb (77.1 kg)    Physical Exam Vitals signs reviewed.  HENT:     Head: Normocephalic and atraumatic.  Eyes:     Pupils: Pupils are equal, round, and reactive to light.  Neck:     Musculoskeletal: Normal range of motion.  Cardiovascular:     Rate and Rhythm: Normal rate and regular rhythm.     Heart sounds: Normal heart sounds.  Pulmonary:     Effort: Pulmonary effort is normal.     Breath sounds: Normal breath sounds.  Abdominal:     General: Bowel sounds are normal.     Palpations: Abdomen is soft.  Musculoskeletal: Normal range of motion.        General: No tenderness or deformity.  Lymphadenopathy:     Cervical: No cervical adenopathy.  Skin:    General: Skin is warm and dry.     Findings: No erythema or rash.  Neurological:     Mental Status: He is alert and oriented to person, place, and time.  Psychiatric:        Behavior: Behavior normal.        Thought Content: Thought content normal.        Judgment: Judgment normal.      Lab Results  Component Value Date   WBC 6.6 03/07/2019   HGB 12.0 (L) 03/07/2019   HCT 35.3 (L) 03/07/2019   MCV 89.1 03/07/2019   PLT 142 (L) 03/07/2019   Lab Results  Component Value Date   FERRITIN 86 11/28/2018   IRON 101 11/28/2018   TIBC 211 11/28/2018   UIBC 110 (L) 11/28/2018   IRONPCTSAT 48 11/28/2018   Lab Results  Component Value Date   RETICCTPCT 1.1 03/07/2019   RBC 3.96 (L) 03/07/2019   RBC 4.00 (L) 03/07/2019   No results found for: KPAFRELGTCHN, LAMBDASER, KAPLAMBRATIO No results found for: IGGSERUM, IGA, IGMSERUM No results found for: Odetta Pink, SPEI   Chemistry      Component Value Date/Time   NA 142 03/07/2019 1520   NA 140 04/20/2017 1417   K 3.8 03/07/2019 1520   K 3.6 04/20/2017 1417   CL 107 03/07/2019 1520   CL 109 (H) 04/20/2017 1417   CO2 29 03/07/2019 1520    CO2 27 04/20/2017 1417   BUN 15 03/07/2019 1520   BUN 15 04/20/2017 1417   CREATININE 1.30 (H) 03/07/2019 1520   CREATININE 1.3 (H) 04/20/2017 1417      Component Value Date/Time   CALCIUM 8.8 (L) 03/07/2019 1520  CALCIUM 8.8 04/20/2017 1417   ALKPHOS 104 03/07/2019 1520   ALKPHOS 104 (H) 04/20/2017 1417   AST 26 03/07/2019 1520   ALT 17 03/07/2019 1520   ALT 27 04/20/2017 1417   BILITOT 0.3 03/07/2019 1520       Impression and Plan: Mr. Philyaw is a very pleasant 68 yo caucasian gentleman with history of 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   He completed 8 cycles of R-CEOP in November 2017.   He really looks quite good.  He is active.  We are going to give him Xgeva today.  I told him that he must take Tums 3 times a day.  I am unsure whether he will actually do this or not.  I do not see any evidence recurrence of the lymphoma or of the renal cell carcinoma.  I do not think we need to do any scans at this point.  We will plan to get her back to see [place] in another 3 months or so.  Like to see him back right after Thanksgiving.  We will do his Xgeva then.  Volanda Napoleon, MD 8/5/20204:08 PM\

## 2019-03-07 NOTE — Patient Instructions (Signed)
Denosumab injection What is this medicine? DENOSUMAB (den oh sue mab) slows bone breakdown. Prolia is used to treat osteoporosis in women after menopause and in men, and in people who are taking corticosteroids for 6 months or more. Xgeva is used to treat a high calcium level due to cancer and to prevent bone fractures and other bone problems caused by multiple myeloma or cancer bone metastases. Xgeva is also used to treat giant cell tumor of the bone. This medicine Coral be used for other purposes; ask your health care provider or pharmacist if you have questions. COMMON BRAND NAME(S): Prolia, XGEVA What should I tell my health care provider before I take this medicine? They need to know if you have any of these conditions:  dental disease  having surgery or tooth extraction  infection  kidney disease  low levels of calcium or Vitamin D in the blood  malnutrition  on hemodialysis  skin conditions or sensitivity  thyroid or parathyroid disease  an unusual reaction to denosumab, other medicines, foods, dyes, or preservatives  pregnant or trying to get pregnant  breast-feeding How should I use this medicine? This medicine is for injection under the skin. It is given by a health care professional in a hospital or clinic setting. A special MedGuide will be given to you before each treatment. Be sure to read this information carefully each time. For Prolia, talk to your pediatrician regarding the use of this medicine in children. Special care [place] be needed. For Xgeva, talk to your pediatrician regarding the use of this medicine in children. While this drug [place] be prescribed for children as young as 13 years for selected conditions, precautions do apply. Overdosage: If you think you have taken too much of this medicine contact a poison control center or emergency room at once. NOTE: This medicine is only for you. Do not share this medicine with others. What if I miss a dose? It is  important not to miss your dose. Call your doctor or health care professional if you are unable to keep an appointment. What Laverne interact with this medicine? Do not take this medicine with any of the following medications:  other medicines containing denosumab This medicine Hemp also interact with the following medications:  medicines that lower your chance of fighting infection  steroid medicines like prednisone or cortisone This list [place] not describe all possible interactions. Give your health care provider a list of all the medicines, herbs, non-prescription drugs, or dietary supplements you use. Also tell them if you smoke, drink alcohol, or use illegal drugs. Some items Andal interact with your medicine. What should I watch for while using this medicine? Visit your doctor or health care professional for regular checks on your progress. Your doctor or health care professional Dorgan order blood tests and other tests to see how you are doing. Call your doctor or health care professional for advice if you get a fever, chills or sore throat, or other symptoms of a cold or flu. Do not treat yourself. This drug Uy decrease your body's ability to fight infection. Try to avoid being around people who are sick. You should make sure you get enough calcium and vitamin D while you are taking this medicine, unless your doctor tells you not to. Discuss the foods you eat and the vitamins you take with your health care professional. See your dentist regularly. Brush and floss your teeth as directed. Before you have any dental work done, tell your dentist you are   receiving this medicine. Do not become pregnant while taking this medicine or for 5 months after stopping it. Talk with your doctor or health care professional about your birth control options while taking this medicine. Women should inform their doctor if they wish to become pregnant or think they might be pregnant. There is a potential for serious side  effects to an unborn child. Talk to your health care professional or pharmacist for more information. What side effects Buffa I notice from receiving this medicine? Side effects that you should report to your doctor or health care professional as soon as possible:  allergic reactions like skin rash, itching or hives, swelling of the face, lips, or tongue  bone pain  breathing problems  dizziness  jaw pain, especially after dental work  redness, blistering, peeling of the skin  signs and symptoms of infection like fever or chills; cough; sore throat; pain or trouble passing urine  signs of low calcium like fast heartbeat, muscle cramps or muscle pain; pain, tingling, numbness in the hands or feet; seizures  unusual bleeding or bruising  unusually weak or tired Side effects that usually do not require medical attention (report to your doctor or health care professional if they continue or are bothersome):  constipation  diarrhea  headache  joint pain  loss of appetite  muscle pain  runny nose  tiredness  upset stomach This list [place] not describe all possible side effects. Call your doctor for medical advice about side effects. You [place] report side effects to FDA at 1-800-FDA-1088. Where should I keep my medicine? This medicine is only given in a clinic, doctor's office, or other health care setting and will not be stored at home. NOTE: This sheet is a summary. It [place] not cover all possible information. If you have questions about this medicine, talk to your doctor, pharmacist, or health care provider.  2020 Elsevier/Gold Standard (2017-11-25 16:10:44)

## 2019-03-08 LAB — IRON AND TIBC
Iron: 102 ug/dL (ref 42–163)
Saturation Ratios: 43 % (ref 20–55)
TIBC: 235 ug/dL (ref 202–409)
UIBC: 133 ug/dL (ref 117–376)

## 2019-03-08 LAB — LACTATE DEHYDROGENASE: LDH: 191 U/L (ref 98–192)

## 2019-03-08 LAB — FERRITIN: Ferritin: 104 ng/mL (ref 24–336)

## 2019-03-22 ENCOUNTER — Encounter: Payer: Self-pay | Admitting: Cardiology

## 2019-03-22 ENCOUNTER — Other Ambulatory Visit: Payer: Self-pay

## 2019-03-22 ENCOUNTER — Ambulatory Visit: Payer: Medicare Other | Admitting: Cardiology

## 2019-03-22 VITALS — BP 149/93 | HR 60 | Ht 70.0 in | Wt 172.0 lb

## 2019-03-22 DIAGNOSIS — I10 Essential (primary) hypertension: Secondary | ICD-10-CM

## 2019-03-22 DIAGNOSIS — I5022 Chronic systolic (congestive) heart failure: Secondary | ICD-10-CM

## 2019-03-22 DIAGNOSIS — I2510 Atherosclerotic heart disease of native coronary artery without angina pectoris: Secondary | ICD-10-CM | POA: Diagnosis not present

## 2019-03-22 MED ORDER — ENTRESTO 97-103 MG PO TABS: 1.0000 | ORAL_TABLET | Freq: Two times a day (BID) | ORAL | 3 refills | Status: DC

## 2019-03-22 MED ORDER — FUROSEMIDE 20 MG PO TABS: 20.0000 mg | ORAL_TABLET | Freq: Every day | ORAL | 3 refills | Status: DC

## 2019-03-22 MED ORDER — CARVEDILOL 12.5 MG PO TABS: 12.5000 mg | ORAL_TABLET | Freq: Two times a day (BID) | ORAL | 3 refills | Status: DC

## 2019-03-22 MED ORDER — CLOPIDOGREL BISULFATE 75 MG PO TABS: 75.0000 mg | ORAL_TABLET | Freq: Every day | ORAL | 3 refills | Status: DC

## 2019-03-22 NOTE — Progress Notes (Signed)
Subjective:   Gilbert [name], male    DOB: 09-Feb-1951, 68 y.o.   MRN: [id number]  Chief complaint:  Coronary artery disease   HPI  68 y/o Caucasian male with CAD s/p MI, LM stenting 2015, ischemic cardiomyopathy with recovery in EF to 55% on guideline directed medical therapy, PAD s/p Left external iliac/common femoral stenting 2015, h/o left nephrectomy for renal cell carcinoma, h/o NHL, currently in remission.  He is doing well. He denies chest pain, shortness of breath, palpitations, leg edema, orthopnea, PND, TIA/syncope. Blood pressure is elevated. He endorses increased salt intake, especially with dressings on sandwich.    Past Medical History:  Diagnosis Date  . Abnormal liver enzymes    Chronic alkaline phosphatase elevation since 2014  . Acid reflux   . CAD (coronary artery disease)   . Cancer ([place]) 08/2013   right kidney cancer  . Cancer ([place])   . Coronary artery calcification seen on CAT scan   . Coronary atherosclerosis of native coronary artery   . 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 Essential hypertension, benign   . Family history of anesthesia complication    mother-had stroke during anesthesia  . GERD (gastroesophageal reflux disease)   . Gout   . History of cardiac arrest 11/26/13   PTCA/Stenting of LM & Prox LAD with 4.0 x 18 mm Xience alpine stent. Used Impella Circulatory assist device. EF= 20-25%  . History of epilepsy    at least 10 years ago. 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 History of myocardial infarction less than 8 weeks    Stent placed  . History of nephrectomy 11/2013   For renal cell carcinoma  . History of tobacco use   . HTN (hypertension)   . Hypercholesteremia   . Hyperlipemia   . Hypertension   . PAD (peripheral artery disease) ([place])   . Seizures ([place])   . Shortness of breath    with activity  . SOB (shortness of breath)   . Systolic and diastolic  CHF, chronic ([facility])    Resolved. EF 45% 04/10/2014  . Therapeutic drug monitoring      Past Surgical History:  Procedure Laterality Date  . BALLOON DILATION N/A 01/21/2015   Procedure: BALLOON DILATION;  Surgeon: Gatha Mayer, MD;  Location: [facility] ENDOSCOPY;  Service: Endoscopy;  Laterality: N/A;  . CARDIAC CATHETERIZATION N/A 06/10/2015   Procedure: Left Heart Cath and Coronary Angiography;  Surgeon: Adrian Prows, MD;  Location: [place] CV LAB;  Service: Cardiovascular;  Laterality: N/A;  . CORONARY ANGIOPLASTY WITH STENT PLACEMENT  2015   Left main coronary artery stenting on emergent basis along with circulatory support with Impella device through left leg. With 4.0 x 18 mm Xience alpine stent  . EAR CYST EXCISION Left 05/31/2018   Procedure: EXCISION LEFT POSTERIOR EAR TUMOR;  Surgeon: Leta Baptist, MD;  Location: [place];  Service: ENT;  Laterality: Left;  . ESOPHAGOGASTRODUODENOSCOPY    . ESOPHAGOGASTRODUODENOSCOPY N/A 01/21/2015   Procedure: ESOPHAGOGASTRODUODENOSCOPY (EGD) with possible dilation.;  Surgeon: Gatha Mayer, MD;  Location: [facility] ENDOSCOPY;  Service: Endoscopy;  Laterality: N/A;  . EYE SURGERY Left 2 weeks ago   torn retina  . ILIAC ARTERY STENT Left 05/28/2014   dr Einar Gip  . LEFT HEART CATHETERIZATION WITH CORONARY ANGIOGRAM N/A 11/27/2013   Procedure: LEFT HEART CATHETERIZATION WITH CORONARY ANGIOGRAM;  Surgeon: [name], MD;  Location: [facility] CATH LAB;  Service:  Cardiovascular;  Laterality: N/A;  . LOWER EXTREMITY ANGIOGRAM N/A 02/26/2014   Procedure: LOWER EXTREMITY ANGIOGRAM;  Surgeon: [name], MD;  Location: [facility] CATH LAB;  Service: Cardiovascular;  Laterality: N/A;  . LOWER EXTREMITY ANGIOGRAM N/A 05/28/2014   Procedure: LOWER EXTREMITY ANGIOGRAM;  Surgeon: [name], MD;  Location: [facility] CATH LAB;  Service: Cardiovascular;  Laterality: N/A;  . NEPHRECTOMY  11/2013  . NO PAST SURGERIES    . PERCUTANEOUS CORONARY STENT INTERVENTION (PCI-S)  11/27/2013    Procedure: PERCUTANEOUS CORONARY STENT INTERVENTION (PCI-S);  Surgeon: [name], MD;  Location: [facility] CATH LAB;  Service: Cardiovascular;;  . ROBOT ASSISTED LAPAROSCOPIC NEPHRECTOMY Right 10/19/2013   Procedure: ROBOTIC ASSISTED LAPAROSCOPIC NEPHRECTOMY,  EXTENSIVE ADHESIOLYSIS;  Surgeon: Alexis Frock, MD;  Location: [facility] ORS;  Service: Urology;  Laterality: Right;  . SKIN FULL THICKNESS GRAFT Left 05/31/2018   Procedure: SKIN GRAFT FULL THICKNESS FROM LEFT ABDOMEN TO LEFT EAR;  Surgeon: Leta Baptist, MD;  Location: [facility] OR;  Service: ENT;  Laterality: Left;     Social History   Socioeconomic History  . Marital status: Divorced    Spouse name: Not on file  . Number of children: 2  . Years of education: Not on file  . Highest education level: Not on file  Occupational History  . Not on file  Social Needs  . Financial resource strain: Not on file  . Food insecurity    Worry: Not on file    Inability: Not on file  . Transportation needs    Medical: Not on file    Non-medical: Not on file  Tobacco Use  . Smoking status: Former Smoker    Packs/day: 2.00    Years: 45.00    Pack years: 90.00    Types: Cigarettes    Quit date: 08/03/2003    Years since quitting: 15.6  . Smokeless tobacco: Current User    Types: Snuff  . Tobacco comment: quit  smoking 10 years agop  Substance and Sexual Activity  . Alcohol use: No    Alcohol/week: 0.0 standard drinks    Comment: quit 1980  . Drug use: No    Comment: none in past 30 years   . Sexual activity: Not on file  Lifestyle  . Physical activity    Days per week: Not on file    Minutes per session: Not on file  . Stress: Not on file  Relationships  . Social Herbalist on phone: Not on file    Gets together: Not on file    Attends religious service: Not on file    Active member of club or organization: Not on file    Attends meetings of clubs or organizations: Not on file    Relationship status: Not on file  . Intimate  partner violence    Fear of current or ex partner: Not on file    Emotionally abused: Not on file    Physically abused: Not on file    Forced sexual activity: Not on file  Other Topics Concern  . Not on file  Social History Narrative   ** Merged History Encounter **         Family History  Problem Relation Age of Onset  . Stroke Mother   . Heart disease Brother        multiple stents  . Hyperlipidemia Brother   . Emphysema Father      Current Outpatient Medications on File Prior to Visit  Medication Sig Dispense Refill  . atorvastatin (LIPITOR) 80 MG tablet Take 1 tablet (80 mg total) by mouth daily. 90 tablet 3  . carvedilol (COREG) 12.5 MG tablet Take 1 tablet (12.5 mg total) by mouth 2 (two) times daily with a meal. 90 tablet 3  . clopidogrel (PLAVIX) 75 MG tablet Take 75 mg by mouth daily.    . Cyanocobalamin (B-12) 5000 MCG CAPS Take 5,000 mcg by mouth every morning.     Marland Kitchen ENTRESTO 97-103 MG Take 1 tablet by mouth 2 (two) times daily.  3  . famotidine (PEPCID) 20 MG tablet Take 20 mg by mouth 2 (two) times daily.    . furosemide (LASIX) 40 MG tablet Take 1 tablet (40 mg total) by mouth 2 (two) times daily. (Patient taking differently: Take 20 mg by mouth daily. ) 60 tablet 6  . phenytoin (DILANTIN) 300 MG ER capsule Take 1 capsule (300 mg total) by mouth daily. 90 capsule 2  . pyridOXINE (VITAMIN B-6) 100 MG tablet Take 100 mg by mouth every morning.     . Thiamine HCl (VITAMIN B-1) 250 MG tablet Take 250 mg by mouth every morning.      Current Facility-Administered Medications on File Prior to Visit  Medication Dose Route Frequency Provider Last Rate Last Dose  . sodium chloride flush (NS) 0.9 % injection 10 mL  10 mL Intravenous PRN Volanda Napoleon, MD   10 mL at 05/05/16 1127  . sodium chloride flush (NS) 0.9 % injection 10 mL  10 mL Intracatheter PRN Volanda Napoleon, MD   10 mL at 06/10/16 1311    Cardiovascular studies:  EKG 03/22/2019: Sinus rhythm 60 bpm.   Old inferior infarct. Left ventricular hypertrophy. ST-T changes likely due to LVH  EKG 04/26/2018: Sinus rhythm 56 bpm. Incomplete LBBB  Echocardiogram 10/11/2017: Left ventricle cavity is normal in size. Moderate concentric hypertrophy of the left ventricle. Low normal LV systolic function. Visual EF is 50-55%. Doppler evidence of grade I (impaired) diastolic dysfunction, indeterminate LAP. Left ventricle regional wall motion findings: No wall motion abnormalities. Calculated EF 46%. Mild tricuspid regurgitation. Borderline pulmonary hypertension, PA pressure estimated at 31 mm Hg. Compared to the study done on 03/30/2017, LV EF is improved from 35% to the present 50-55%. Compared to hospital echo 06/02/17, EF improved from 20%. RV function was previously mildly reduced and small pericardial effusion and left pleural effusion not present.  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 Right common carotid artery and right external carotid artery stenosis of (<50%). Stenosis in the left internal carotid artery (16-49%).  Antegrade right vertebral artery flow. Antegrade left vertebral artery flow. Compared to the study done on 01/27/2018,  right ICA stenosis is new.  Left ICA stenosis was >50%. F/U in 6 months.  Lexiscan thalium stress test 06/09/2017: 1. Prognostically, this is a high risk study. Resting EKG demonstrates ectopic atrial rhythm and sinus rhythm with LVH with repolarization, cannot exclude lateral ischemia. Stress EKG is nondiagnostic for ischemia as is the pharmacologic stress test. Stress symptoms included dyspnea and dizziness. 2. There is a mild area of ischemia of small size in the septal wall.  There is very large scar of the inferolateral segment with very mild residual ischemia. 3. Compared to the prior study dated 05/09/2015, the current study now reveals Inferior scar with mild lateral ischemia new. EF has decreaed from 30% to 17%.  Recent  labs: Results for Gilbert [name], Gilbert [name] (MRN [id number]) as of  03/22/2019 10:43  Ref. Range 03/07/2019 15:20  Sodium Latest Ref Range: 135 - 145 mmol/L 142  Potassium Latest Ref Range: 3.5 - 5.1 mmol/L 3.8  Chloride Latest Ref Range: 98 - 111 mmol/L 107  CO2 Latest Ref Range: 22 - 32 mmol/L 29  Glucose Latest Ref Range: 70 - 99 mg/dL 80  BUN Latest Ref Range: 8 - 23 mg/dL 15  Creatinine Latest Ref Range: 0.61 - 1.24 mg/dL 1.30 (H)  Calcium Latest Ref Range: 8.9 - 10.3 mg/dL 8.8 (L)  Anion gap Latest Ref Range: 5 - 15  6  Alkaline Phosphatase Latest Ref Range: 38 - 126 U/L 104  Albumin Latest Ref Range: 3.5 - 5.0 g/dL 3.6  AST Latest Ref Range: 15 - 41 U/L 26  ALT Latest Ref Range: 0 - 44 U/L 17  Total Protein Latest Ref Range: 6.5 - 8.1 g/dL 5.8 (L)  Total Bilirubin Latest Ref Range: 0.3 - 1.2 mg/dL 0.3  GFR, Est Non African American Latest Ref Range: >60 mL/min 56 (L)  GFR, Est African American Latest Ref Range: >60 mL/min >60  LDH Latest Ref Range: 98 - 192 U/L 191  Iron Latest Ref Range: 42 - 163 ug/dL 102  UIBC Latest Ref Range: 117 - 376 ug/dL 133  TIBC Latest Ref Range: 202 - 409 ug/dL 235  Saturation Ratios Latest Ref Range: 20 - 55 % 43  Ferritin Latest Ref Range: 24 - 336 ng/mL 104   Results for Gilbert [name], Gilbert [name] (MRN [id number]) as of 03/22/2019 10:43  Ref. Range 03/07/2019 15:20  WBC Latest Ref Range: 4.0 - 10.5 K/uL 6.6  RBC Latest Ref Range: 4.22 - 5.81 MIL/uL 3.96 (L)  Hemoglobin Latest Ref Range: 13.0 - 17.0 g/dL 12.0 (L)  HCT Latest Ref Range: 39.0 - 52.0 % 35.3 (L)  MCV Latest Ref Range: 80.0 - 100.0 fL 89.1  MCH Latest Ref Range: 26.0 - 34.0 pg 30.3  MCHC Latest Ref Range: 30.0 - 36.0 g/dL 34.0  RDW Latest Ref Range: 11.5 - 15.5 % 13.3  Platelets Latest Ref Range: 150 - 400 K/uL 142 (L)  nRBC Latest Ref Range: 0.0 - 0.2 % 0.0   Review of Systems  Constitution: Negative for decreased appetite, malaise/fatigue, weight gain and weight loss.  HENT: Negative for congestion.   Eyes:  Negative for visual disturbance.  Cardiovascular: Negative for chest pain, dyspnea on exertion, leg swelling, palpitations and syncope.  Respiratory: Negative for shortness of breath.   Endocrine: Negative for cold intolerance.  Hematologic/Lymphatic: Does not bruise/bleed easily.  Skin: Negative for itching and rash.  Musculoskeletal: Negative for myalgias.  Gastrointestinal: Negative for abdominal pain, nausea and vomiting.  Genitourinary: Negative for dysuria.  Neurological: Negative for dizziness and weakness.  Psychiatric/Behavioral: The patient is not nervous/anxious.   All other systems reviewed and are negative.        Vitals:   03/22/19 1537 03/22/19 1609  BP: (!) 152/76 (!) 149/93  Pulse: 68 60  SpO2: 98%     Physical Exam  Constitutional: He is oriented to person, place, and time. He appears well-developed and well-nourished. No distress.  HENT:  Head: Normocephalic and atraumatic.  Eyes: Pupils are equal, round, and reactive to light. Conjunctivae are normal.  Neck: No JVD present.  Cardiovascular: Normal rate, regular rhythm and intact distal pulses.  Pulmonary/Chest: Effort normal and breath sounds normal. He has no wheezes. He has no rales.  Abdominal: Soft. Bowel sounds are normal. There is no rebound.  Musculoskeletal:  General: No edema.  Lymphadenopathy:    He has no cervical adenopathy.  Neurological: He is alert and oriented to person, place, and time. No cranial nerve deficit.  Skin: Skin is warm and dry.  Lentigines on sun exposed areas.  Psychiatric: He has a normal mood and affect.  Nursing note and vitals reviewed.       Assessment & Recommendations:  68 y/o Caucasian male with CAD s/p MI, LM stenting 2015, ischemic cardiomyopathy with recovery in EF to 55% on guideline directed medical therapy, PAD s/p Left external iliac/common femoral stenting 2015, h/o left nephrectomy for renal cell carcinoma, h/o NHL, currently in remission.   Iscehmic cardiomyopathy: EF recoverred to 50-55% on guiideline directed medical thrapy. Conintue coreg 12.5 mg bid, Entresto 97-103 mg bid Not on spironolactone due to improved NYHA symptoms and gynacomastia from spironolactone. Lasix as needed  CAD: Stable. No angina symptoms. Stop plavix given ongoing use of dilantin. Resumed aspirin 81 mg daily. Refilled Lipitor.   Hypertension: Suboptimal control. Encourage salt reduction. He has follow up with PCP next week, where BP could be rechecked. If BP persists to be elevated, consider adding another agents, perhaps amlodipine.   PAD: Stable.  F/u in 6 months  Alawna Graybeal Esther Hardy, MD [facility] [place] Pager: [phone] Office: [phone] If no answer Cell [phone]

## 2019-03-23 DIAGNOSIS — I5022 Chronic systolic (congestive) heart failure: Secondary | ICD-10-CM | POA: Insufficient documentation

## 2019-05-02 ENCOUNTER — Ambulatory Visit: Payer: Medicare Other | Admitting: Family Medicine

## 2019-05-04 ENCOUNTER — Ambulatory Visit (INDEPENDENT_AMBULATORY_CARE_PROVIDER_SITE_OTHER): Payer: Medicare Other | Admitting: Family Medicine

## 2019-05-04 ENCOUNTER — Other Ambulatory Visit: Payer: Self-pay

## 2019-05-04 VITALS — BP 160/78 | HR 62 | Temp 97.8°F | Wt 174.6 lb

## 2019-05-04 DIAGNOSIS — G40909 Epilepsy, unspecified, not intractable, without status epilepticus: Secondary | ICD-10-CM | POA: Diagnosis not present

## 2019-05-04 DIAGNOSIS — I10 Essential (primary) hypertension: Secondary | ICD-10-CM | POA: Diagnosis not present

## 2019-05-04 DIAGNOSIS — Z5181 Encounter for therapeutic drug level monitoring: Secondary | ICD-10-CM

## 2019-05-04 DIAGNOSIS — E785 Hyperlipidemia, unspecified: Secondary | ICD-10-CM

## 2019-05-04 DIAGNOSIS — Z1211 Encounter for screening for malignant neoplasm of colon: Secondary | ICD-10-CM

## 2019-05-04 MED ORDER — AMLODIPINE BESYLATE 2.5 MG PO TABS: 2.5000 mg | ORAL_TABLET | Freq: Every day | ORAL | 2 refills | Status: DC

## 2019-05-04 MED ORDER — PHENYTOIN SODIUM EXTENDED 300 MG PO CAPS: 300.0000 mg | ORAL_CAPSULE | Freq: Every day | ORAL | 3 refills | Status: DC

## 2019-05-04 NOTE — Patient Instructions (Addendum)
  I do recommend meeting with a dentist as dilantin can affect dental care/gums.   Blood pressure is still running too high.  Cutting back on sodium can help but I think addition of low-dose amlodipine will also be helpful as recommended by cardiology.  Check blood pressure readings at home, and if readings remain over 140/90 on the new medication and with less salt, let me know for further medication changes.  Otherwise keep follow-up with specialists as planned.    Thank you for coming in today and take care.   If you have lab work done today you will be contacted with your lab results within the next 2 weeks.  If you have not heard from [place] then please contact us. The fastest way to get your results is to register for My Chart.   IF you received an x-ray today, you will receive an invoice from [facility]. Please contact [facility] at [phone] with questions or concerns regarding your invoice.   IF you received labwork today, you will receive an invoice from [facility]. Please contact [facility] at [phone] with questions or concerns regarding your invoice.   Our billing staff will not be able to assist you with questions regarding bills from these companies.  You will be contacted with the lab results as soon as they are available. The fastest way to get your results is to activate your My Chart account. Instructions are located on the last page of this paperwork. If you have not heard from [place] regarding the results in 2 weeks, please contact this office.

## 2019-05-04 NOTE — Progress Notes (Signed)
Subjective:    Patient ID: Gilbert [name], male    DOB: 27-Oct-1950, 68 y.o.   MRN: [id number]  HPI Gilbert [name] is a 68 y.o. male Presents today for: Chief Complaint  Patient presents with   chronic medical condition    6 month f/u    Cardiac: CAD, PAD, hypertension. Cardiologist Dr. Virgina Jock, last appointment August 20. History of CAD status post MI, stenting in 2015, ischemic cardiomyopathy with an EF 55% on medical therapy, peripheral arterial disease status post left external iliac common femoral stenting in 2015.  Blood pressure 149/93 at cardiology visit August 20.  Did endorse some increased sodium in diet. Continued on Coreg 12.5 mg twice daily, Entresto 97/103 mg twice daily.  Lasix as needed.  Plavix was discontinued given ongoing use of Dilantin, aspirin 81 mg daily recommended and Lipitor continued. Discussed addition of amlodipine if persistent elevated blood pressure at today's visit.  Still on plavix, not ASA - talked to cardiology about this.   Home readings: none.  Has continue to add salt to foods - feels like   BP Readings from Last 3 Encounters:  05/04/19 (!) 160/78  03/22/19 (!) 149/93  03/07/19 (!) 160/61   Lab Results  Component Value Date   CREATININE 1.30 (H) 03/07/2019   Hyperlipidemia: With CAD as above.  Recent LFTs okay, no recent lipid panel Lab Results  Component Value Date   CHOL 163 01/23/2015   HDL 52 01/23/2015   LDLCALC 96 01/23/2015   TRIG 73 01/23/2015   CHOLHDL 3.1 01/23/2015   Lab Results  Component Value Date   ALT 17 03/07/2019   AST 26 03/07/2019   ALKPHOS 104 03/07/2019   BILITOT 0.3 03/07/2019    Non-Hodgkin's lymphoma: Oncologist Dr. Marin Olp, appointment August 5 ECOG 0. Status post 8 cycles of R CHOP in November 2017.  Treated with Xgeva with Tums 3 times per day No apparent recurrence of lymphoma or previous renal cell carcinoma.  Deferred scans at this time.  Plan to follow-up with oncology after Thanksgiving  with Delton See again.  Taking tums 3-6 per day.   Seizure disorder Has been stable with Dilantin 300 mg daily, with reported seizure last experience 10 to 15 years ago.  Although his Dilantin levels have been fairly low in the past, as he has been seizure-free decided to remain on same level medication and dental follow-up  recommended based on chronic phenytoin use.  No recent seizures. Still taking phenytoin once per day. No new side effects.  Has not seen dentist in a long time.  Depression screen [facility] 2/9 05/04/2019 10/30/2018 03/23/2018 02/24/2018 06/13/2017  Decreased Interest 0 0 0 0 0  Down, Depressed, Hopeless 0 0 0 0 0  PHQ - 2 Score 0 0 0 0 0  Some recent data might be hidden  denies, depression/SI symptoms.   Lab Results  Component Value Date   PHENYTOIN 3.7 (L) 06/01/2017    Lab Results  Component Value Date   ALT 17 03/07/2019   AST 26 03/07/2019   ALKPHOS 104 03/07/2019   BILITOT 0.3 03/07/2019   Lab Results  Component Value Date   WBC 6.6 03/07/2019   HGB 12.0 (L) 03/07/2019   HCT 35.3 (L) 03/07/2019   MCV 89.1 03/07/2019   PLT 142 (L) 03/07/2019   Lab Results  Component Value Date   CREATININE 1.30 (H) 03/07/2019    Health Maintenance: No prior colonoscopy. Declines. Agrees to [employer].    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   Hematuria 10/29/2015   Dysphagia, pharyngoesophageal phase    Early satiety    Esophageal stricture    Gastritis and gastroduodenitis    PAD (peripheral artery disease) ([place]) 05/28/2014   Cardiomyopathy, ischemic 04/03/2014   History of left heart  catheterization (L[facility]) 02/26/2014   Arterial bleed, intraoperative 02/26/2014   Seizures ([place]) 11/27/2013   HTN (hypertension) 11/27/2013   Hypomagnesemia 11/27/2013   CAD (coronary artery disease), native coronary artery 11/26/2013   Renal cancer ([place]) 10/19/2013   Coronary artery calcification 09/25/2013   Renal cyst 08/30/2013   Loss of weight 08/03/2013   Past Medical History:  Diagnosis Date   Abnormal liver enzymes    Chronic alkaline phosphatase elevation since 2014   Acid reflux    CAD (coronary artery disease)    Cancer ([place]) 08/2013   right kidney cancer   Cancer ([place])    Coronary artery calcification seen on CAT scan    Coronary atherosclerosis of native coronary artery    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 Used Impella Circulatory assist device. EF= 20-25%   History of epilepsy    at least 10 years ago. 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 EF 45% 04/10/2014   Therapeutic drug monitoring    Past Surgical History:  Procedure Laterality Date   BALLOON DILATION N/A 01/21/2015   Procedure: BALLOON DILATION;  Surgeon: Gatha Mayer, MD;  Location: [place];  Service:  Endoscopy;  Laterality: N/A;   CARDIAC CATHETERIZATION N/A 06/10/2015   Procedure: Left Heart Cath and Coronary Angiography;  Surgeon: Adrian Prows, MD;  Location: [place] [facility];  Service: Cardiovascular;  Laterality: N/A;   CORONARY ANGIOPLASTY WITH STENT PLACEMENT  2015   Left main coronary artery stenting on emergent basis along with circulatory support with Impella device through left leg. With 4.0 x 18 mm Xience alpine stent   EAR CYST EXCISION Left 05/31/2018   Procedure: EXCISION LEFT POSTERIOR EAR TUMOR;  Surgeon: Leta Baptist, MD;  Location: [facility];  Service: ENT;  Laterality: Left;   ESOPHAGOGASTRODUODENOSCOPY     ESOPHAGOGASTRODUODENOSCOPY N/A 01/21/2015   Procedure: ESOPHAGOGASTRODUODENOSCOPY (EGD) with possible dilation.;  Surgeon: Gatha Mayer, MD;  Location: [place];  Service: Endoscopy;  Laterality: N/A;   EYE SURGERY Left 2  weeks ago   torn retina   ILIAC ARTERY STENT Left 05/28/2014   dr Einar Gip   LEFT HEART CATHETERIZATION WITH CORONARY ANGIOGRAM N/A 11/27/2013   Procedure: LEFT HEART CATHETERIZATION WITH CORONARY ANGIOGRAM;  Surgeon: [name], MD;  Location: [facility] CATH LAB;  Service: Cardiovascular;  Laterality: N/A;   LOWER EXTREMITY ANGIOGRAM N/A 02/26/2014   Procedure: LOWER EXTREMITY ANGIOGRAM;  Surgeon: [name], MD;  Location: [facility] CATH LAB;  Service: Cardiovascular;  Laterality: N/A;   LOWER EXTREMITY ANGIOGRAM N/A 05/28/2014   Procedure: LOWER EXTREMITY ANGIOGRAM;  Surgeon: [name], MD;  Location: [facility] CATH LAB;  Service: Cardiovascular;  Laterality: N/A;   NEPHRECTOMY  11/2013   NO PAST SURGERIES     PERCUTANEOUS CORONARY STENT INTERVENTION (PCI-S)  11/27/2013   Procedure: PERCUTANEOUS CORONARY STENT INTERVENTION (PCI-S);  Surgeon: [name], MD;  Location: [facility] CATH LAB;  Service: Cardiovascular;;   ROBOT ASSISTED LAPAROSCOPIC NEPHRECTOMY Right 10/19/2013   Procedure: ROBOTIC ASSISTED LAPAROSCOPIC NEPHRECTOMY,  EXTENSIVE  ADHESIOLYSIS;  Surgeon: Alexis Frock, MD;  Location: [facility];  Service: Urology;  Laterality: Right;   SKIN FULL THICKNESS GRAFT Left 05/31/2018   Procedure: SKIN GRAFT FULL THICKNESS FROM LEFT ABDOMEN TO LEFT EAR;  Surgeon: Leta Baptist, MD;  Location: [place];  Service: ENT;  Laterality: Left;   Allergies  Allergen Reactions   Oxycodone Swelling and Other (See Comments)    Tongue and lips swell   Prior to Admission medications   Medication Sig Start Date End Date Taking? Authorizing Provider  atorvastatin (LIPITOR) 80 MG tablet Take 1 tablet (80 mg total) by mouth daily. 12/07/18  Yes Patwardhan, Manish J, MD  carvedilol (COREG) 12.5 MG tablet Take 1 tablet (12.5 mg total) by mouth 2 (two) times daily with a meal. 03/22/19  Yes Patwardhan, Manish J, MD  clopidogrel (PLAVIX) 75 MG tablet Take 1 tablet (75 mg total) by mouth daily. 03/22/19  Yes Patwardhan, Manish J, MD  Cyanocobalamin (B-12) 5000 MCG CAPS Take 5,000 mcg by mouth every morning.    Yes [provider]  ENTRESTO 97-103 MG Take 1 tablet by mouth 2 (two) times daily. 03/22/19  Yes Patwardhan, Manish J, MD  famotidine (PEPCID) 20 MG tablet Take 20 mg by mouth 2 (two) times daily.   Yes [provider]  furosemide (LASIX) 20 MG tablet Take 1 tablet (20 mg total) by mouth daily. 03/22/19  Yes Patwardhan, Manish J, MD  phenytoin (DILANTIN) 300 MG ER capsule Take 1 capsule (300 mg total) by mouth daily. 10/30/18  Yes Wendie Agreste, MD  pyridOXINE (VITAMIN B-6) 100 MG tablet Take 100 mg by mouth every morning.    Yes [provider]  Thiamine HCl (VITAMIN B-1) 250 MG tablet Take 250 mg by mouth every morning.    Yes [provider]   Social History   Socioeconomic History   Marital status: Divorced    Spouse name: Not on file   Number of children: 2   Years of education: Not on file   Highest education level: Not on file  Occupational History   Not on file  Social Needs   Financial resource  strain: Not on file   Food insecurity    Worry: Not on file    Inability: Not on file   Transportation needs    Medical: Not on file    Non-medical: Not on file  Tobacco Use   Smoking status: Former Smoker    Packs/day: 2.00  Years: 45.00    Pack years: 90.00    Types: Cigarettes    Quit date: 08/03/2003    Years since quitting: 15.7   Smokeless tobacco: Current User    Types: Snuff   Tobacco comment: quit  smoking 10 years agop  Substance and Sexual Activity   Alcohol use: No    Alcohol/week: 0.0 standard drinks    Comment: quit 1980   Drug use: No    Comment: none in past 30 years    Sexual activity: Not on file  Lifestyle   Physical activity    Days per week: Not on file    Minutes per session: Not on file   Stress: Not on file  Relationships   Social connections    Talks on phone: Not on file    Gets together: Not on file    Attends religious service: Not on file    Active member of club or organization: Not on file    Attends meetings of clubs or organizations: Not on file    Relationship status: Not on file   Intimate partner violence    Fear of current or ex partner: Not on file    Emotionally abused: Not on file    Physically abused: Not on file    Forced sexual activity: Not on file  Other Topics Concern   Not on file  Social History Narrative   ** Merged History Encounter **        Review of Systems  Constitutional: Negative for fatigue and unexpected weight change.  Eyes: Negative for visual disturbance.  Respiratory: Negative for cough, chest tightness and shortness of breath.   Cardiovascular: Negative for chest pain, palpitations and leg swelling.  Gastrointestinal: Negative for abdominal pain and blood in stool.  Neurological: Negative for dizziness, light-headedness and headaches.   Per HPI.     Objective:   Physical Exam Vitals signs reviewed.  Constitutional:      Appearance: He is well-developed.  HENT:     Head:  Normocephalic and atraumatic.  Eyes:     Pupils: Pupils are equal, round, and reactive to light.  Neck:     Vascular: No carotid bruit or JVD.  Cardiovascular:     Rate and Rhythm: Normal rate and regular rhythm.     Heart sounds: Normal heart sounds. No murmur.  Pulmonary:     Effort: Pulmonary effort is normal.     Breath sounds: Normal breath sounds. No rales.  Skin:    General: Skin is warm and dry.  Neurological:     Mental Status: He is alert and oriented to person, place, and time.    Vitals:   05/04/19 1353 05/04/19 1418  BP: (!) 174/76 (!) 160/78  Pulse: 62   Temp: 97.8 F (36.6 C)   TempSrc: Oral   SpO2: 97%   Weight: 174 lb 9.6 oz (79.2 kg)       Assessment & Plan:   Gilbert [name] is a 68 y.o. male Hyperlipidemia, unspecified hyperlipidemia type - Plan: Lipid Panel  -Tolerating statin, no recent lipid panel noted.  Check labs.  Seizure disorder ([place]) - Plan: phenytoin (DILANTIN) 300 MG ER capsule, Phenytoin level, free Medication monitoring encounter  -Seizure-free for many years with current dose of Dilantin.  Continue same, check levels.  Other monitoring tests reviewed recently.  Again stressed importance of dental follow-up as every 3 months is typically recommended with use of Dilantin.  Essential hypertension - Plan: amLODipine (NORVASC) 2.5 MG  tablet  -Still decreased control, add amlodipine 2.5 mg daily as well as cutting back on salt intake should help.  Special screening for malignant neoplasms, colon - Plan: Cologuard  -Colon cancer screening options discussed, declined colonoscopy but did agree to Cologuard.  Limitations on testing and intervals with potential need for diagnostic colonoscopy discussed.  Meds ordered this encounter  Medications   phenytoin (DILANTIN) 300 MG ER capsule    Sig: Take 1 capsule (300 mg total) by mouth daily.    Dispense:  90 capsule    Refill:  3   amLODipine (NORVASC) 2.5 MG tablet    Sig: Take 1 tablet (2.5 mg  total) by mouth daily.    Dispense:  90 tablet    Refill:  2   Patient Instructions    I do recommend meeting with a dentist as dilantin can affect dental care/gums.   Blood pressure is still running too high.  Cutting back on sodium can help but I think addition of low-dose amlodipine will also be helpful as recommended by cardiology.  Check blood pressure readings at home, and if readings remain over 140/90 on the new medication and with less salt, let me know for further medication changes.  Otherwise keep follow-up with specialists as planned.    Thank you for coming in today and take care.   If you have lab work done today you will be contacted with your lab results within the next 2 weeks.  If you have not heard from [place] then please contact us. The fastest way to get your results is to register for My Chart.   IF you received an x-ray today, you will receive an invoice from [facility]. Please contact [facility] at [phone] with questions or concerns regarding your invoice.   IF you received labwork today, you will receive an invoice from [place]. Please contact [facility] at [phone] with questions or concerns regarding your invoice.   Our billing staff will not be able to assist you with questions regarding bills from these companies.  You will be contacted with the lab results as soon as they are available. The fastest way to get your results is to activate your My Chart account. Instructions are located on the last page of this paperwork. If you have not heard from [place] regarding the results in 2 weeks, please contact this office.       Signed,   [name], MD [facility] [place].  05/05/19 8:20 AM

## 2019-05-05 ENCOUNTER — Encounter: Payer: Self-pay | Admitting: Family Medicine

## 2019-05-07 LAB — LIPID PANEL
Chol/HDL Ratio: 3.3 ratio (ref 0.0–5.0)
Cholesterol, Total: 194 mg/dL (ref 100–199)
HDL: 59 mg/dL (ref >39)
LDL Chol Calc (NIH): 111 mg/dL — ABNORMAL HIGH (ref 0–99)
Triglycerides: 136 mg/dL (ref 0–149)
VLDL Cholesterol Cal: 24 mg/dL (ref 5–40)

## 2019-05-07 LAB — PHENYTOIN LEVEL, FREE: Phenytoin, Free: 0.9 ug/mL — ABNORMAL LOW (ref 1.0–2.0)

## 2019-05-09 ENCOUNTER — Other Ambulatory Visit: Payer: Self-pay | Admitting: Cardiology

## 2019-05-23 ENCOUNTER — Telehealth: Payer: Self-pay | Admitting: Family Medicine

## 2019-05-23 LAB — COLOGUARD: Cologuard: POSITIVE — AB

## 2019-05-23 NOTE — Telephone Encounter (Signed)
Copied from [place] [id number]. Topic: General - Other >> May 23, 2019 12:17 PM Rainey Pines A wrote: Mary from [employer] called to see if abnormal cologuard results were received via fax yesterday. Best contact  [phone] select provider support . Order # [id number]

## 2019-05-27 ENCOUNTER — Other Ambulatory Visit: Payer: Self-pay | Admitting: Family Medicine

## 2019-05-27 DIAGNOSIS — R195 Other fecal abnormalities: Secondary | ICD-10-CM

## 2019-06-04 ENCOUNTER — Other Ambulatory Visit: Payer: Self-pay | Admitting: Cardiology

## 2019-06-04 DIAGNOSIS — I10 Essential (primary) hypertension: Secondary | ICD-10-CM

## 2019-07-04 ENCOUNTER — Inpatient Hospital Stay: Payer: Medicare Other

## 2019-07-04 ENCOUNTER — Inpatient Hospital Stay: Payer: Medicare Other | Admitting: Hematology & Oncology

## 2019-07-10 ENCOUNTER — Other Ambulatory Visit: Payer: Self-pay | Admitting: Cardiology

## 2019-07-11 ENCOUNTER — Inpatient Hospital Stay (HOSPITAL_BASED_OUTPATIENT_CLINIC_OR_DEPARTMENT_OTHER): Payer: Medicare Other | Admitting: Family

## 2019-07-11 ENCOUNTER — Inpatient Hospital Stay: Payer: Medicare Other

## 2019-07-11 ENCOUNTER — Inpatient Hospital Stay: Payer: Medicare Other | Attending: Hematology & Oncology

## 2019-07-11 ENCOUNTER — Encounter: Payer: Self-pay | Admitting: Family

## 2019-07-11 ENCOUNTER — Other Ambulatory Visit: Payer: Self-pay

## 2019-07-11 VITALS — BP 173/83 | HR 54 | Temp 97.8°F | Resp 18 | Ht 70.0 in | Wt 177.1 lb

## 2019-07-11 DIAGNOSIS — C641 Malignant neoplasm of right kidney, except renal pelvis: Secondary | ICD-10-CM | POA: Insufficient documentation

## 2019-07-11 DIAGNOSIS — T451X5A Adverse effect of antineoplastic and immunosuppressive drugs, initial encounter: Secondary | ICD-10-CM | POA: Diagnosis not present

## 2019-07-11 DIAGNOSIS — C8330 Diffuse large B-cell lymphoma, unspecified site: Secondary | ICD-10-CM

## 2019-07-11 DIAGNOSIS — D6481 Anemia due to antineoplastic chemotherapy: Secondary | ICD-10-CM | POA: Insufficient documentation

## 2019-07-11 DIAGNOSIS — C8589 Other specified types of non-Hodgkin lymphoma, extranodal and solid organ sites: Secondary | ICD-10-CM | POA: Diagnosis not present

## 2019-07-11 DIAGNOSIS — Z452 Encounter for adjustment and management of vascular access device: Secondary | ICD-10-CM | POA: Insufficient documentation

## 2019-07-11 DIAGNOSIS — Z79899 Other long term (current) drug therapy: Secondary | ICD-10-CM | POA: Diagnosis not present

## 2019-07-11 DIAGNOSIS — Z95828 Presence of other vascular implants and grafts: Secondary | ICD-10-CM

## 2019-07-11 LAB — CBC WITH DIFFERENTIAL (CANCER CENTER ONLY)
Abs Immature Granulocytes: 0.01 10*3/uL (ref 0.00–0.07)
Basophils Absolute: 0.1 10*3/uL (ref 0.0–0.1)
Basophils Relative: 1 %
Eosinophils Absolute: 0.6 10*3/uL — ABNORMAL HIGH (ref 0.0–0.5)
Eosinophils Relative: 8 %
HCT: 36.1 % — ABNORMAL LOW (ref 39.0–52.0)
Hemoglobin: 12.7 g/dL — ABNORMAL LOW (ref 13.0–17.0)
Immature Granulocytes: 0 %
Lymphocytes Relative: 15 %
Lymphs Abs: 1.1 10*3/uL (ref 0.7–4.0)
MCH: 30.8 pg (ref 26.0–34.0)
MCHC: 35.2 g/dL (ref 30.0–36.0)
MCV: 87.4 fL (ref 80.0–100.0)
Monocytes Absolute: 0.5 10*3/uL (ref 0.1–1.0)
Monocytes Relative: 7 %
Neutro Abs: 5.2 10*3/uL (ref 1.7–7.7)
Neutrophils Relative %: 69 %
Platelet Count: 180 10*3/uL (ref 150–400)
RBC: 4.13 MIL/uL — ABNORMAL LOW (ref 4.22–5.81)
RDW: 13.2 % (ref 11.5–15.5)
WBC Count: 7.5 10*3/uL (ref 4.0–10.5)
nRBC: 0 % (ref 0.0–0.2)

## 2019-07-11 LAB — CMP (CANCER CENTER ONLY)
ALT: 16 U/L (ref 0–44)
AST: 23 U/L (ref 15–41)
Albumin: 3.9 g/dL (ref 3.5–5.0)
Alkaline Phosphatase: 115 U/L (ref 38–126)
Anion gap: 7 (ref 5–15)
BUN: 13 mg/dL (ref 8–23)
CO2: 29 mmol/L (ref 22–32)
Calcium: 8.8 mg/dL — ABNORMAL LOW (ref 8.9–10.3)
Chloride: 104 mmol/L (ref 98–111)
Creatinine: 1.25 mg/dL — ABNORMAL HIGH (ref 0.61–1.24)
GFR, Est AFR Am: >60 mL/min (ref >60)
GFR, Estimated: 59 mL/min — ABNORMAL LOW (ref >60)
Glucose, Bld: 86 mg/dL (ref 70–99)
Potassium: 3.4 mmol/L — ABNORMAL LOW (ref 3.5–5.1)
Sodium: 140 mmol/L (ref 135–145)
Total Bilirubin: 0.4 mg/dL (ref 0.3–1.2)
Total Protein: 6.2 g/dL — ABNORMAL LOW (ref 6.5–8.1)

## 2019-07-11 MED ORDER — HEPARIN SOD (PORK) LOCK FLUSH 100 UNIT/ML IV SOLN
500.0000 [IU] | Freq: Once | INTRAVENOUS | Status: AC
  Administered 2019-07-11: 500 [IU] via INTRAVENOUS
  Filled 2019-07-11: qty 5

## 2019-07-11 MED ORDER — DENOSUMAB 120 MG/1.7ML ~~LOC~~ SOLN
120.0000 mg | Freq: Once | SUBCUTANEOUS | Status: AC
  Administered 2019-07-11: 120 mg via SUBCUTANEOUS

## 2019-07-11 MED ORDER — DENOSUMAB 120 MG/1.7ML ~~LOC~~ SOLN
SUBCUTANEOUS | Status: AC
  Filled 2019-07-11: qty 1.7

## 2019-07-11 MED ORDER — SODIUM CHLORIDE 0.9% FLUSH
10.0000 mL | INTRAVENOUS | Status: DC | PRN
  Administered 2019-07-11: 10 mL via INTRAVENOUS
  Filled 2019-07-11: qty 10

## 2019-07-11 NOTE — Progress Notes (Signed)
Hematology and Oncology Follow Up Visit  Gilbert [name] [id number] Feb 25, 1951 68 y.o. 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 He is doing well and has no complaints at this time.  He is still very active golfing several days a week. He golfed 18 holes yesterday and plans to go again tomorrow.  No recent infections. No fever, chills, n/v, cough, rash, dizziness, SOB, chest pain, palpitations, abdominal pain or changes in bowel or bladder habits.  No swelling, tenderness, numbness or tingling in her extremities. No falls or syncope.  He has not noted any blood loss. No bruising or petechiae.  He is eating well and making an effort to stay well hydrated. His weight is stable at 177 lbs.   ECOG Performance Status: 1 - Symptomatic but completely ambulatory  Medications:  Allergies as of 07/11/2019      Reactions   Oxycodone Swelling, Other (See Comments)   Tongue and lips swell      Medication List       Accurate as of July 11, 2019  2:00 PM. If you have any questions, ask your nurse or doctor.        amLODipine 2.5 MG tablet Commonly known as: NORVASC Take 1 tablet (2.5 mg total) by mouth daily.   atorvastatin 80 MG tablet Commonly known as: LIPITOR Take 1 tablet (80 mg total) by mouth daily.   B-12 5000 MCG Caps Take 5,000 mcg by mouth every morning.   carvedilol 12.5 MG tablet Commonly known as: COREG Take 1 tablet by mouth twice daily   clopidogrel 75 MG tablet Commonly known as: PLAVIX Take 1 tablet (75 mg total) by mouth daily.   Entresto 97-103 MG Generic  drug: sacubitril-valsartan Take 1 tablet by mouth 2 (two) times daily.   famotidine 20 MG tablet Commonly known as: PEPCID Take 20 mg by mouth 2 (two) times daily.   furosemide 20 MG tablet Commonly known as: LASIX Take 1 tablet (20 mg total) by mouth daily.   furosemide 40 MG tablet Commonly known as: LASIX TAKE 1/2 (ONE-HALF) TABLET BY MOUTH ONCE DAILY AS NEEDED   phenytoin 300 MG ER capsule Commonly known as: DILANTIN Take 1 capsule (300 mg total) by mouth daily.   pyridOXINE 100 MG tablet Commonly known as: VITAMIN B-6 Take 100 mg by mouth every morning.   vitamin B-1 250 MG tablet Take 250 mg by mouth every morning.       Allergies:  Allergies  Allergen Reactions  . Oxycodone Swelling and Other (See Comments)    Tongue and lips swell    Past Medical History, Surgical history, Social history, and Family History were reviewed and updated.  Review of Systems: All other 10 point review of systems is negative.   Physical Exam:  vitals were not taken for this visit.   Wt Readings from Last 3 Encounters:  05/04/19 174 lb 9.6 oz (79.2 kg)  03/22/19 172 lb (78 kg)  03/07/19 170 lb (77.1 kg)    Ocular: Sclerae unicteric, pupils equal, round and reactive to light Ear-nose-throat: Oropharynx clear, dentition fair Lymphatic: No cervical or  supraclavicular adenopathy Lungs no rales or rhonchi, good excursion bilaterally Heart regular rate and rhythm, no murmur appreciated Abd soft, nontender, positive bowel sounds, no liver or spleen tip palpated on exam, no fluid wave  MSK no focal spinal tenderness, no joint edema Neuro: non-focal, well-oriented, appropriate affect Breasts: Deferred   Lab Results  Component Value Date   WBC 6.6 03/07/2019   HGB 12.0 (L) 03/07/2019   HCT 35.3 (L) 03/07/2019   MCV 89.1 03/07/2019   PLT 142 (L) 03/07/2019   Lab Results  Component Value Date   FERRITIN 104 03/07/2019   IRON 102 03/07/2019   TIBC 235 03/07/2019   UIBC 133  03/07/2019   IRONPCTSAT 43 03/07/2019   Lab Results  Component Value Date   RETICCTPCT 1.1 03/07/2019   RBC 3.96 (L) 03/07/2019   RBC 4.00 (L) 03/07/2019   No results found for: KPAFRELGTCHN, LAMBDASER, KAPLAMBRATIO No results found for: IGGSERUM, IGA, IGMSERUM No results found for: Odetta Pink, SPEI   Chemistry      Component Value Date/Time   NA 142 03/07/2019 1520   NA 140 04/20/2017 1417   K 3.8 03/07/2019 1520   K 3.6 04/20/2017 1417   CL 107 03/07/2019 1520   CL 109 (H) 04/20/2017 1417   CO2 29 03/07/2019 1520   CO2 27 04/20/2017 1417   BUN 15 03/07/2019 1520   BUN 15 04/20/2017 1417   CREATININE 1.30 (H) 03/07/2019 1520   CREATININE 1.3 (H) 04/20/2017 1417      Component Value Date/Time   CALCIUM 8.8 (L) 03/07/2019 1520   CALCIUM 8.8 04/20/2017 1417   ALKPHOS 104 03/07/2019 1520   ALKPHOS 104 (H) 04/20/2017 1417   AST 26 03/07/2019 1520   ALT 17 03/07/2019 1520   ALT 27 04/20/2017 1417   BILITOT 0.3 03/07/2019 1520       Impression and Plan: Gilbert [name] is a very pleasant 68 yo caucasian gentleman with history of 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 He completed 8 cycles of R-CEOP in November 2017.  He continues to do well and so far there has not been any evidence of either malignancy.  He received Xgeva today.  We will continue to flush his port every 8 weeks and plan to see him back for follow-up and Xgeva now every 6 months, per Dr. Marin Olp.  He will contact our office with any questions or concerns. We can certainly see him sooner if needed.   Laverna Peace, NP 12/9/20202:00 PM

## 2019-07-11 NOTE — Patient Instructions (Signed)
Denosumab injection What is this medicine? DENOSUMAB (den oh sue mab) slows bone breakdown. Prolia is used to treat osteoporosis in women after menopause and in men, and in people who are taking corticosteroids for 6 months or more. Xgeva is used to treat a high calcium level due to cancer and to prevent bone fractures and other bone problems caused by multiple myeloma or cancer bone metastases. Xgeva is also used to treat giant cell tumor of the bone. This medicine Watchman be used for other purposes; ask your health care provider or pharmacist if you have questions. COMMON BRAND NAME(S): Prolia, XGEVA What should I tell my health care provider before I take this medicine? They need to know if you have any of these conditions:  dental disease  having surgery or tooth extraction  infection  kidney disease  low levels of calcium or Vitamin D in the blood  malnutrition  on hemodialysis  skin conditions or sensitivity  thyroid or parathyroid disease  an unusual reaction to denosumab, other medicines, foods, dyes, or preservatives  pregnant or trying to get pregnant  breast-feeding How should I use this medicine? This medicine is for injection under the skin. It is given by a health care professional in a hospital or clinic setting. A special MedGuide will be given to you before each treatment. Be sure to read this information carefully each time. For Prolia, talk to your pediatrician regarding the use of this medicine in children. Special care Patrone be needed. For Xgeva, talk to your pediatrician regarding the use of this medicine in children. While this drug Yusupov be prescribed for children as young as 13 years for selected conditions, precautions do apply. Overdosage: If you think you have taken too much of this medicine contact a poison control center or emergency room at once. NOTE: This medicine is only for you. Do not share this medicine with others. What if I miss a dose? It is  important not to miss your dose. Call your doctor or health care professional if you are unable to keep an appointment. What Almond interact with this medicine? Do not take this medicine with any of the following medications:  other medicines containing denosumab This medicine Cerutti also interact with the following medications:  medicines that lower your chance of fighting infection  steroid medicines like prednisone or cortisone This list Petros not describe all possible interactions. Give your health care provider a list of all the medicines, herbs, non-prescription drugs, or dietary supplements you use. Also tell them if you smoke, drink alcohol, or use illegal drugs. Some items Blyth interact with your medicine. What should I watch for while using this medicine? Visit your doctor or health care professional for regular checks on your progress. Your doctor or health care professional Sokol order blood tests and other tests to see how you are doing. Call your doctor or health care professional for advice if you get a fever, chills or sore throat, or other symptoms of a cold or flu. Do not treat yourself. This drug Muffley decrease your body's ability to fight infection. Try to avoid being around people who are sick. You should make sure you get enough calcium and vitamin D while you are taking this medicine, unless your doctor tells you not to. Discuss the foods you eat and the vitamins you take with your health care professional. See your dentist regularly. Brush and floss your teeth as directed. Before you have any dental work done, tell your dentist you are   receiving this medicine. Do not become pregnant while taking this medicine or for 5 months after stopping it. Talk with your doctor or health care professional about your birth control options while taking this medicine. Women should inform their doctor if they wish to become pregnant or think they might be pregnant. There is a potential for serious side  effects to an unborn child. Talk to your health care professional or pharmacist for more information. What side effects Salsgiver I notice from receiving this medicine? Side effects that you should report to your doctor or health care professional as soon as possible:  allergic reactions like skin rash, itching or hives, swelling of the face, lips, or tongue  bone pain  breathing problems  dizziness  jaw pain, especially after dental work  redness, blistering, peeling of the skin  signs and symptoms of infection like fever or chills; cough; sore throat; pain or trouble passing urine  signs of low calcium like fast heartbeat, muscle cramps or muscle pain; pain, tingling, numbness in the hands or feet; seizures  unusual bleeding or bruising  unusually weak or tired Side effects that usually do not require medical attention (report to your doctor or health care professional if they continue or are bothersome):  constipation  diarrhea  headache  joint pain  loss of appetite  muscle pain  runny nose  tiredness  upset stomach This list S[place]heim not describe all possible side effects. Call your doctor for medical advice about side effects. You Demonte report side effects to FDA at 1-800-FDA-1088. Where should I keep my medicine? This medicine is only given in a clinic, doctor's office, or other health care setting and will not be stored at home. NOTE: This sheet is a summary. It Blakely not cover all possible information. If you have questions about this medicine, talk to your doctor, pharmacist, or health care provider.  2020 Elsevier/Gold Standard (2017-11-25 16:10:44)

## 2019-07-12 ENCOUNTER — Telehealth: Payer: Self-pay | Admitting: Hematology & Oncology

## 2019-07-12 LAB — LACTATE DEHYDROGENASE: LDH: 209 U/L — ABNORMAL HIGH (ref 98–192)

## 2019-07-12 NOTE — Telephone Encounter (Signed)
Appointments scheduled letter/calendar mailed per 12/9 los

## 2019-07-18 NOTE — Telephone Encounter (Signed)
FYI

## 2019-07-18 NOTE — Telephone Encounter (Signed)
This was from October Cologuard testing.  I referred him to gastroenterology, and looking at the referral notes a message was left on his voicemail for him to call and schedule gastroenterology appointment..  Please check status, has he scheduled that visit?

## 2019-07-19 NOTE — Telephone Encounter (Signed)
Left a msg for patient to return call. We trying to be sure he has got in touch with Gastro to schedule an appt since his cologurad test was abnormal.  Also left msg to get in contact with [facility] if he have not spoking with them to get this appt scheduled. We see Gertie Fey has tried to reach out to him and left msg for him to schedule

## 2019-09-05 ENCOUNTER — Other Ambulatory Visit: Payer: Self-pay | Admitting: Family

## 2019-09-05 ENCOUNTER — Inpatient Hospital Stay: Payer: Medicare Other | Attending: Hematology & Oncology

## 2019-09-05 ENCOUNTER — Other Ambulatory Visit: Payer: Self-pay

## 2019-09-05 VITALS — BP 157/78 | HR 56 | Temp 97.5°F | Resp 17

## 2019-09-05 DIAGNOSIS — C8330 Diffuse large B-cell lymphoma, unspecified site: Secondary | ICD-10-CM

## 2019-09-05 DIAGNOSIS — Z9221 Personal history of antineoplastic chemotherapy: Secondary | ICD-10-CM | POA: Diagnosis not present

## 2019-09-05 DIAGNOSIS — C8589 Other specified types of non-Hodgkin lymphoma, extranodal and solid organ sites: Secondary | ICD-10-CM

## 2019-09-05 DIAGNOSIS — Z85528 Personal history of other malignant neoplasm of kidney: Secondary | ICD-10-CM | POA: Diagnosis not present

## 2019-09-05 DIAGNOSIS — Z452 Encounter for adjustment and management of vascular access device: Secondary | ICD-10-CM | POA: Diagnosis not present

## 2019-09-05 DIAGNOSIS — Z8572 Personal history of non-Hodgkin lymphomas: Secondary | ICD-10-CM | POA: Insufficient documentation

## 2019-09-05 MED ORDER — HEPARIN SOD (PORK) LOCK FLUSH 100 UNIT/ML IV SOLN
500.0000 [IU] | Freq: Once | INTRAVENOUS | Status: AC | PRN
  Administered 2019-09-05: 500 [IU]
  Filled 2019-09-05: qty 5

## 2019-09-05 MED ORDER — SODIUM CHLORIDE 0.9% FLUSH
10.0000 mL | INTRAVENOUS | Status: DC | PRN
  Administered 2019-09-05: 14:00:00 10 mL
  Filled 2019-09-05: qty 10

## 2019-09-20 ENCOUNTER — Telehealth: Payer: Medicare Other | Admitting: Cardiology

## 2019-09-20 ENCOUNTER — Other Ambulatory Visit: Payer: Self-pay

## 2019-09-20 NOTE — Progress Notes (Signed)
Unable to reach on phone. Please reschedule to in person visit another day.

## 2019-09-22 ENCOUNTER — Other Ambulatory Visit: Payer: Self-pay

## 2019-09-22 ENCOUNTER — Ambulatory Visit: Payer: Medicare Other | Attending: Internal Medicine

## 2019-09-22 DIAGNOSIS — Z23 Encounter for immunization: Secondary | ICD-10-CM | POA: Insufficient documentation

## 2019-09-22 NOTE — Progress Notes (Signed)
   Covid-19 Vaccination Clinic  Name:  Gilbert [name]    MRN: [id number] DOB: 04/27/1951  09/22/2019  [name] was observed post Covid-19 immunization for 15 minutes without incidence. He was provided with Vaccine Information Sheet and instruction to access the V-Safe system.   [name] was instructed to call 911 with any severe reactions post vaccine: Marland Kitchen Difficulty breathing  . Swelling of your face and throat  . A fast heartbeat  . A bad rash all over your body  . Dizziness and weakness    Immunizations Administered    Name Date Dose VIS Date Route   Pfizer COVID-19 Vaccine 09/22/2019  8:46 AM 0.3 mL 07/13/2019 Intramuscular   Manufacturer: [place]   Lot: [id number]   [place]: [id number]

## 2019-09-26 ENCOUNTER — Telehealth: Payer: Medicare Other | Admitting: Cardiology

## 2019-09-26 ENCOUNTER — Encounter: Payer: Self-pay | Admitting: Cardiology

## 2019-09-26 VITALS — Ht 70.0 in | Wt 177.0 lb

## 2019-09-26 DIAGNOSIS — E782 Mixed hyperlipidemia: Secondary | ICD-10-CM

## 2019-09-26 DIAGNOSIS — I10 Essential (primary) hypertension: Secondary | ICD-10-CM

## 2019-09-26 DIAGNOSIS — I2510 Atherosclerotic heart disease of native coronary artery without angina pectoris: Secondary | ICD-10-CM

## 2019-09-26 DIAGNOSIS — I5022 Chronic systolic (congestive) heart failure: Secondary | ICD-10-CM

## 2019-09-26 MED ORDER — EZETIMIBE 10 MG PO TABS: 10.0000 mg | ORAL_TABLET | Freq: Every day | ORAL | 4 refills | Status: DC

## 2019-09-26 NOTE — Progress Notes (Signed)
Subjective:   Gilbert [name], male    DOB: 1950/12/20, 69 y.o.   MRN: [id number]   I connected with the patient on 09/26/2019 by a telephone call and verified that I am speaking with the correct person using two identifiers.     I offered the patient a video enabled application for a virtual visit. Unfortunately, this could not be accomplished due to technical difficulties/lack of video enabled phone/computer. I discussed the limitations of evaluation and management by telemedicine and the availability of in person appointments. The patient expressed understanding and agreed to proceed.   This visit type was conducted due to national recommendations for restrictions regarding the COVID-19 Pandemic (e.g. social distancing).  This format is felt to be most appropriate for this patient at this time.  All issues noted in this document were discussed and addressed.  No physical exam was performed (except for noted visual exam findings with Tele health visits).  The patient has consented to conduct a Tele health visit and understands insurance will be billed.   Chief complaint:  Coronary artery disease  69 y/o Caucasian male with CAD s/p MI, LM stenting 2015, ischemic cardiomyopathy with recovery in EF to 55% on guideline directed medical therapy, PAD s/p Left external iliac/common femoral stenting 2015, h/o left nephrectomy for renal cell carcinoma, h/o NHL, currently in remission.  He is doing well. He denies chest pain, shortness of breath, palpitations, leg edema, orthopnea, PND, TIA/syncope. He does not have his blood pressure readings available today. Reviewed recent labs and lipid panel with the patient.     Current Outpatient Medications on File Prior to Visit  Medication Sig Dispense Refill  . amLODipine (NORVASC) 2.5 MG tablet Take 1 tablet (2.5 mg total) by mouth daily. 90 tablet 2  . atorvastatin (LIPITOR) 80 MG tablet Take 1 tablet (80 mg total) by mouth daily. 90 tablet 3  . carvedilol  (COREG) 12.5 MG tablet Take 1 tablet by mouth twice daily 180 tablet 0  . clopidogrel (PLAVIX) 75 MG tablet Take 1 tablet (75 mg total) by mouth daily. 90 tablet 3  . Cyanocobalamin (B-12) 5000 MCG CAPS Take 5,000 mcg by mouth every morning.     Marland Kitchen ENTRESTO 97-103 MG Take 1 tablet by mouth 2 (two) times daily. 180 tablet 3  . famotidine (PEPCID) 20 MG tablet Take 20 mg by mouth 2 (two) times daily.    . phenytoin (DILANTIN) 300 MG ER capsule Take 1 capsule (300 mg total) by mouth daily. 90 capsule 3  . pyridOXINE (VITAMIN B-6) 100 MG tablet Take 100 mg by mouth every morning.     . Thiamine HCl (VITAMIN B-1) 250 MG tablet Take 250 mg by mouth every morning.     . furosemide (LASIX) 20 MG tablet Take 1 tablet (20 mg total) by mouth daily. 90 tablet 3  . furosemide (LASIX) 40 MG tablet TAKE 1/2 (ONE-HALF) TABLET BY MOUTH ONCE DAILY AS NEEDED 45 tablet 0   Current Facility-Administered Medications on File Prior to Visit  Medication Dose Route Frequency Provider Last Rate Last Admin  . sodium chloride flush (NS) 0.9 % injection 10 mL  10 mL Intravenous PRN Volanda Napoleon, MD   10 mL at 05/05/16 1127    Cardiovascular studies:  EKG 03/22/2019: Sinus rhythm 60 bpm.  Old inferior infarct. Left ventricular hypertrophy. ST-T changes likely due to LVH  EKG 04/26/2018: Sinus rhythm 56 bpm. Incomplete LBBB  Echocardiogram 10/11/2017: Left ventricle cavity is normal in size.  Moderate concentric hypertrophy of the left ventricle. Low normal LV systolic function. Visual EF is 50-55%. Doppler evidence of grade I (impaired) diastolic dysfunction, indeterminate LAP. Left ventricle regional wall motion findings: No wall motion abnormalities. Calculated EF 46%. Mild tricuspid regurgitation. Borderline pulmonary hypertension, PA pressure estimated at 31 mm Hg. Compared to the study done on 03/30/2017, LV EF is improved from 35% to the present 50-55%. Compared to hospital echo 06/02/17, EF improved from 20%.  RV function was previously mildly reduced and small pericardial effusion and left pleural effusion not present.  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 Right common carotid artery and right external carotid artery stenosis of (<50%). Stenosis in the left internal carotid artery (16-49%).  Antegrade right vertebral artery flow. Antegrade left vertebral artery flow. Compared to the study done on 01/27/2018,  right ICA stenosis is new.  Left ICA stenosis was >50%. F/U in 6 months.  Lexiscan thalium stress test 06/09/2017: 1. Prognostically, this is a high risk study. Resting EKG demonstrates ectopic atrial rhythm and sinus rhythm with LVH with repolarization, cannot exclude lateral ischemia. Stress EKG is nondiagnostic for ischemia as is the pharmacologic stress test. Stress symptoms included dyspnea and dizziness. 2. There is a mild area of ischemia of small size in the septal wall.  There is very large scar of the inferolateral segment with very mild residual ischemia. 3. Compared to the prior study dated 05/09/2015, the current study now reveals Inferior scar with mild lateral ischemia new. EF has decreaed from 30% to 17%.  Recent labs: 07/11/2019: Glucose 86, BUN/Cr 13/1.25. EGFR 59. Na/K 140/3.4. Rest of the CMP normal H/H 12.7/36.1. MCV 87. Platelets 180 Chol 194, TG 136, HDL 59, LDL 111  Review of Systems  Cardiovascular: Negative for chest pain, dyspnea on exertion, leg swelling, palpitations and syncope.        Vitals:    Physical Exam  Not performed. Telephone visit.       Assessment & Recommendations:  69 y/o Caucasian male with CAD s/p MI, LM stenting 2015, ischemic cardiomyopathy with recovery in EF to 55% on guideline directed medical therapy, PAD s/p Left external iliac/common femoral stenting 2015, h/o left nephrectomy for renal cell carcinoma, h/o NHL, currently in remission.  Iscehmic cardiomyopathy: EF recoverred to 50-55% on  guiideline directed medical thrapy. Continue coreg 12.5 mg bid, Entresto 97-103 mg bid Not on spironolactone due to improved NYHA symptoms and gynacomastia from spironolactone. Lasix as needed  CAD: Stable. No angina symptoms. Continue Aspirin 81 mg, lipitor 80 mg. Added Zetia due to suboptimal control of LDL. Repeat lipid panel in 3 months.   Hypertension: Will check at next visit.  PAD: Stable.  F/u in 3 months after repeat lipid panel.  Nigel Mormon, MD [facility] Pager: [phone] Office: [phone] If no answer Cell [phone]

## 2019-10-17 ENCOUNTER — Ambulatory Visit: Payer: Medicare Other | Attending: Internal Medicine

## 2019-10-17 DIAGNOSIS — Z23 Encounter for immunization: Secondary | ICD-10-CM

## 2019-10-17 NOTE — Progress Notes (Signed)
   Covid-19 Vaccination Clinic  Name:  Gilbert [name]    MRN: [id number] DOB: [birth year]  10/17/2019  Gilbert [name] was observed post Covid-19 immunization for 15 minutes without incident. He was provided with Vaccine Information Sheet and instruction to access the V-Safe system.   Gilbert [name] was instructed to call 911 with any severe reactions post vaccine: Marland Kitchen Difficulty breathing  . Swelling of face and throat  . A fast heartbeat  . A bad rash all over body  . Dizziness and weakness   Immunizations Administered    Name Date Dose VIS Date Route   Pfizer COVID-19 Vaccine 10/17/2019 10:11 AM 0.3 mL 07/13/2019 Intramuscular   Manufacturer: [place]   Lot: [id number]   [place]: [id number]

## 2019-10-19 ENCOUNTER — Other Ambulatory Visit: Payer: Self-pay | Admitting: Cardiology

## 2019-10-31 ENCOUNTER — Other Ambulatory Visit: Payer: Self-pay

## 2019-10-31 ENCOUNTER — Inpatient Hospital Stay: Payer: Medicare Other | Attending: Hematology & Oncology

## 2019-10-31 VITALS — BP 192/84 | HR 55 | Temp 97.5°F | Resp 18

## 2019-10-31 DIAGNOSIS — Z452 Encounter for adjustment and management of vascular access device: Secondary | ICD-10-CM | POA: Insufficient documentation

## 2019-10-31 DIAGNOSIS — Z8572 Personal history of non-Hodgkin lymphomas: Secondary | ICD-10-CM | POA: Insufficient documentation

## 2019-10-31 DIAGNOSIS — Z95828 Presence of other vascular implants and grafts: Secondary | ICD-10-CM

## 2019-10-31 MED ORDER — HEPARIN SOD (PORK) LOCK FLUSH 100 UNIT/ML IV SOLN
500.0000 [IU] | Freq: Once | INTRAVENOUS | Status: AC
  Administered 2019-10-31: 500 [IU] via INTRAVENOUS
  Filled 2019-10-31: qty 5

## 2019-10-31 MED ORDER — SODIUM CHLORIDE 0.9% FLUSH
10.0000 mL | INTRAVENOUS | Status: DC | PRN
  Administered 2019-10-31: 10 mL via INTRAVENOUS
  Filled 2019-10-31: qty 10

## 2019-10-31 NOTE — Patient Instructions (Signed)

## 2019-11-17 ENCOUNTER — Other Ambulatory Visit: Payer: Self-pay | Admitting: Cardiology

## 2019-11-19 ENCOUNTER — Other Ambulatory Visit: Payer: Self-pay

## 2019-11-19 MED ORDER — ATORVASTATIN CALCIUM 80 MG PO TABS: 80.0000 mg | ORAL_TABLET | Freq: Every day | ORAL | 3 refills | Status: DC

## 2019-12-12 ENCOUNTER — Inpatient Hospital Stay: Payer: Medicare Other | Attending: Hematology & Oncology

## 2019-12-12 ENCOUNTER — Other Ambulatory Visit: Payer: Self-pay

## 2019-12-12 DIAGNOSIS — C8589 Other specified types of non-Hodgkin lymphoma, extranodal and solid organ sites: Secondary | ICD-10-CM

## 2019-12-12 DIAGNOSIS — Z8572 Personal history of non-Hodgkin lymphomas: Secondary | ICD-10-CM | POA: Diagnosis present

## 2019-12-12 DIAGNOSIS — Z452 Encounter for adjustment and management of vascular access device: Secondary | ICD-10-CM | POA: Insufficient documentation

## 2019-12-12 DIAGNOSIS — C8330 Diffuse large B-cell lymphoma, unspecified site: Secondary | ICD-10-CM

## 2019-12-12 MED ORDER — SODIUM CHLORIDE 0.9% FLUSH
10.0000 mL | INTRAVENOUS | Status: DC | PRN
  Administered 2019-12-12: 10 mL
  Filled 2019-12-12: qty 10

## 2019-12-12 MED ORDER — HEPARIN SOD (PORK) LOCK FLUSH 100 UNIT/ML IV SOLN
500.0000 [IU] | Freq: Once | INTRAVENOUS | Status: AC | PRN
  Administered 2019-12-12: 500 [IU]
  Filled 2019-12-12: qty 5

## 2019-12-12 NOTE — Patient Instructions (Signed)
Tunneled Central Venous Catheter Flushing Guide  It is important to flush your tunneled central venous catheter each time you use it, both before and after you use it. Flushing your catheter will help prevent it from clogging. What are the risks? Risks Gilbert [name] include:  Infection.  Air getting into the catheter and bloodstream. Supplies needed:  A clean pair of gloves.  A disinfecting wipe. Use an alcohol wipe, chlorhexidine wipe, or iodine wipe as told by your health care provider.  A 10 mL syringe that has been prefilled with saline solution.  An empty 10 mL syringe, if a substance called heparin was injected into your catheter. How to flush your catheter When you flush your catheter, make sure you follow any specific instructions from your health care provider or the manufacturer. These are general guidelines. Flushing your catheter before use If there is heparin in your catheter: 1. Wash your hands with soap and water. 2. Put on gloves. 3. Scrub the injection cap for a minimum of 15 seconds with a disinfecting wipe. 4. Unclamp the catheter. 5. Attach the empty syringe to the injection cap. 6. Pull the syringe plunger back and withdraw 10 mL of blood. 7. Place the syringe into an appropriate waste container. 8. Scrub the injection cap for 15 seconds with a disinfecting wipe. 9. Attach the prefilled syringe to the injection cap. 10. Flush the catheter by pushing the plunger forward until all the liquid from the syringe is in the catheter. 11. Remove the syringe from the injection cap. 12. Clamp the catheter. If there is no heparin in your catheter: 1. Wash your hands with soap and water. 2. Put on gloves. 3. Scrub the injection cap for 15 seconds with a disinfecting wipe. 4. Unclamp the catheter. 5. Attach the prefilled syringe to the injection cap. 6. Flush the catheter by pushing the plunger forward until 5 mL of the liquid from the syringe is in the catheter. 7. Pull back on  the syringe until you see blood in the catheter. 8. If you have been asked to collect any blood, follow your health care provider's instructions. Otherwise, flush the catheter with the rest of the solution from the syringe. 9. Remove the syringe from the injection cap. 10. Clamp the catheter.  Flushing your catheter after use 1. Wash your hands with soap and water. 2. Put on gloves. 3. Scrub the injection cap for 15 seconds with a disinfecting wipe. 4. Unclamp the catheter. 5. Attach the prefilled syringe to the injection cap. 6. Flush the catheter by pushing the plunger forward until all of the liquid from the syringe is in the catheter. 7. Remove the syringe from the injection cap. 8. Clamp the catheter. Problems and solutions  If blood cannot be completely cleared from the injection cap, you Gilbert [name] need to have the injection cap replaced.  If the catheter is difficult to flush, use the pulsing method. The pulsing method involves pushing only a few milliliters of solution into the catheter at a time and pausing between pushes.  If you do not see blood in the catheter when you pull back on the syringe, change your body position, such as by raising your arms above your head. Take a deep breath and cough. Then, pull back on the syringe. If you still do not see blood, flush the catheter with a small amount of solution. Then, change positions again and take a breath or cough. Pull back on the syringe again. If you still do not see   blood, finish flushing the catheter and contact your health care provider. Do not use your catheter until your health care provider says it is okay. General tips  Have someone help you flush your catheter, if possible.  Do not force fluid through your catheter.  Do not use a syringe that is larger or smaller than 10 mL. Using a smaller syringe can make the catheter burst.  Do not use your catheter without flushing it first if it has heparin in it. Contact a health  care provider if:  You cannot see any blood in the catheter when you flush it before using it.  Your catheter is difficult to flush. Get help right away if:  You cannot flush the catheter.  The catheter leaks when you flush it or when there is fluid in it.  There are cracks or breaks in the catheter. Summary  It is important to flush your tunneled central venous catheter each time you use it, both before and after you use it.  Scrub the injection cap for 15 seconds with a disinfecting wipe before and after you flush it.  When you flush your catheter, make sure you follow any specific instructions from your health care provider or the manufacturer.  Get help right away if you cannot flush the catheter. This information is not intended to replace advice given to you by your health care provider. Make sure you discuss any questions you have with your health care provider. Document Revised: 04/13/2019 Document Reviewed: 10/04/2018 Elsevier Patient Education  2020 [employer]vier [employer].  

## 2019-12-22 NOTE — Progress Notes (Signed)
 Follow up visit  Subjective:   Gilbert [name], male    DOB: 08/11/1950, 69 y.o.   MRN: [id number]  HPI   Chief Complaint  Patient presents with  . Hypertension  . Congestive Heart Failure  . Follow-up    3 month    69 y.o. Caucasian male  with CAD s/p MI, LM stenting 2015, ischemic cardiomyopathy with recovery in EF to 55% on guideline directed medical therapy, PAD s/p Left external iliac/common femoral stenting 2015, h/o left nephrectomy for renal cell carcinoma, h/o NHL, currently in remission.  He denies chest pain, shortness of breath, palpitations, leg edema, orthopnea, PND, TIA/syncope. However, his blood pressure is elevated. He does not check it regularly, but is always elevated when he does check it.   Current Outpatient Medications on File Prior to Visit  Medication Sig Dispense Refill  . amLODipine (NORVASC) 2.5 MG tablet Take 1 tablet (2.5 mg total) by mouth daily. 90 tablet 2  . atorvastatin (LIPITOR) 80 MG tablet Take 1 tablet (80 mg total) by mouth daily. 90 tablet 3  . carvedilol (COREG) 12.5 MG tablet Take 1 tablet by mouth twice daily 180 tablet 0  . clopidogrel (PLAVIX) 75 MG tablet Take 1 tablet (75 mg total) by mouth daily. 90 tablet 3  . Cyanocobalamin (B-12) 5000 MCG CAPS Take 5,000 mcg by mouth every morning.     . ENTRESTO 97-103 MG Take 1 tablet by mouth 2 (two) times daily. 180 tablet 3  . ezetimibe (ZETIA) 10 MG tablet Take 1 tablet (10 mg total) by mouth daily. 30 tablet 4  . famotidine (PEPCID) 20 MG tablet Take 20 mg by mouth 2 (two) times daily.    . furosemide (LASIX) 40 MG tablet TAKE 1/2 (ONE-HALF) TABLET BY MOUTH ONCE DAILY AS NEEDED 45 tablet 0  . phenytoin (DILANTIN) 300 MG ER capsule Take 1 capsule (300 mg total) by mouth daily. 90 capsule 3  . pyridOXINE (VITAMIN B-6) 100 MG tablet Take 100 mg by mouth every morning.     . Thiamine HCl (VITAMIN B-1) 250 MG tablet Take 250 mg by mouth every morning.      Current Facility-Administered  Medications on File Prior to Visit  Medication Dose Route Frequency Provider Last Rate Last Admin  . sodium chloride flush (NS) 0.9 % injection 10 mL  10 mL Intravenous PRN Ennever, Peter R, MD   10 mL at 05/05/16 1127    Cardiovascular & other pertient studies:  EKG 03/22/2019: Sinus rhythm 60 bpm.  Old inferior infarct. Left ventricular hypertrophy. ST-T changes likely due to LVH  Echocardiogram 10/11/2017: Left ventricle cavity is normal in size. Moderate concentric hypertrophy of the left ventricle. Low normal LV systolic function. Visual EF is 50-55%. Doppler evidence of grade I (impaired) diastolic dysfunction, indeterminate LAP. Left ventricle regional wall motion findings: No wall motion abnormalities. Calculated EF 46%. Mild tricuspid regurgitation. Borderline pulmonary hypertension, PA pressure estimated at 31 mm Hg. Compared to the study done on 03/30/2017, LV EF is improved from 35% to the present 50-55%. Compared to hospital echo 06/02/17, EF improved from 20%. RV function was previously mildly reduced and small pericardial effusion and left pleural effusion not present.  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 Right common carotid artery and right external carotid artery stenosis of (<50%). Stenosis in the left internal carotid artery (16-49%).  Antegrade right vertebral artery flow. Antegrade left vertebral artery flow. Compared to the study done on 01/27/2018,  right   ICA stenosis is new.  Left ICA stenosis was >50%. F/U in 6 months.  Lexiscan thalium stress test 06/09/2017: 1. Prognostically, this is a high risk study. Resting EKG demonstrates ectopic atrial rhythm and sinus rhythm with LVH with repolarization, cannot exclude lateral ischemia. Stress EKG is nondiagnostic for ischemia as is the pharmacologic stress test. Stress symptoms included dyspnea and dizziness. 2. There is a mild area of ischemia of small size in the septal wall.  There is  very large scar of the inferolateral segment with very mild residual ischemia. 3. Compared to the prior study dated 05/09/2015, the current study now reveals Inferior scar with mild lateral ischemia new. EF has decreaed from 30% to 17%.  Recent labs: 07/11/2019: Glucose 86, BUN/Cr 13/1.25. EGFR 59. Na/K 140/3.4. Rest of the CMP normal H/H 12.7/36.1. MCV 87. Platelets 180 Chol 194, TG 136, HDL 59, LDL 111   Review of Systems  Cardiovascular: Negative for chest pain, dyspnea on exertion, leg swelling, palpitations and syncope.        Vitals:   12/24/19 1128  BP: (!) 182/87  Pulse: (!) 53  Resp: 17  SpO2: 96%    Body mass index is 25.81 kg/m. Filed Weights   12/24/19 1128  Weight: 179 lb 14.4 oz (81.6 kg)    Objective:   Physical Exam  Constitutional: No distress.  Neck: No JVD present.  Cardiovascular: Normal rate, regular rhythm, normal heart sounds and intact distal pulses.  No murmur heard. Pulmonary/Chest: Effort normal and breath sounds normal. He has no wheezes. He has no rales.  Musculoskeletal:        General: No edema.  Nursing note and vitals reviewed.         Assessment & Recommendations:   69 y/o Caucasian male with CAD s/p MI, LM stenting 2015, ischemic cardiomyopathy with recovery in EF to 55% on guideline directed medical therapy, PAD s/p Left external iliac/common femoral stenting 2015, h/o left nephrectomy for renal cell carcinoma, h/o NHL, currently in remission.  Hypertension: Uncontrolled. Increase amlodipine to 5 mg daily. Bring all meds to next visit in 4 weeks.   Ischemic cardiomyopathy: EF recoverred to 50-55% on guiideline directed medical thrapy. Continue coreg 12.5 mg bid, Entresto 97-103 mg bid. Not on spironolactone due to improved NYHA symptoms and gynacomastia from spironolactone. Lasix as needed  CAD: Stable. No angina symptoms. Continue Aspirin 81 mg, lipitor 80 mg.  PAD: Stable.  F/u in 4 weeks    J  , MD [facility] Pager: [phone] Office: [phone]    

## 2019-12-24 ENCOUNTER — Ambulatory Visit: Payer: Medicare Other | Admitting: Cardiology

## 2019-12-24 ENCOUNTER — Encounter: Payer: Self-pay | Admitting: Cardiology

## 2019-12-24 ENCOUNTER — Other Ambulatory Visit: Payer: Self-pay

## 2019-12-24 VITALS — BP 161/96 | HR 53 | Resp 17 | Ht 70.0 in | Wt 179.9 lb

## 2019-12-24 DIAGNOSIS — I10 Essential (primary) hypertension: Secondary | ICD-10-CM

## 2019-12-24 DIAGNOSIS — I2510 Atherosclerotic heart disease of native coronary artery without angina pectoris: Secondary | ICD-10-CM

## 2019-12-24 DIAGNOSIS — I5022 Chronic systolic (congestive) heart failure: Secondary | ICD-10-CM

## 2019-12-24 DIAGNOSIS — E782 Mixed hyperlipidemia: Secondary | ICD-10-CM

## 2019-12-24 MED ORDER — AMLODIPINE BESYLATE 5 MG PO TABS: 2.5000 mg | ORAL_TABLET | Freq: Every day | ORAL | 3 refills | Status: DC

## 2020-01-10 ENCOUNTER — Inpatient Hospital Stay: Payer: Medicare Other | Attending: Hematology & Oncology

## 2020-01-10 ENCOUNTER — Inpatient Hospital Stay: Payer: Medicare Other | Admitting: Hematology & Oncology

## 2020-01-10 ENCOUNTER — Inpatient Hospital Stay: Payer: Medicare Other

## 2020-01-14 ENCOUNTER — Other Ambulatory Visit: Payer: Self-pay | Admitting: Cardiology

## 2020-01-23 ENCOUNTER — Encounter: Payer: Self-pay | Admitting: Cardiology

## 2020-01-23 ENCOUNTER — Ambulatory Visit: Payer: Medicare Other | Admitting: Cardiology

## 2020-01-23 ENCOUNTER — Other Ambulatory Visit: Payer: Self-pay

## 2020-01-23 VITALS — BP 138/76 | HR 63 | Wt 181.2 lb

## 2020-01-23 DIAGNOSIS — I2510 Atherosclerotic heart disease of native coronary artery without angina pectoris: Secondary | ICD-10-CM

## 2020-01-23 DIAGNOSIS — I5022 Chronic systolic (congestive) heart failure: Secondary | ICD-10-CM

## 2020-01-23 DIAGNOSIS — I10 Essential (primary) hypertension: Secondary | ICD-10-CM

## 2020-01-23 DIAGNOSIS — E782 Mixed hyperlipidemia: Secondary | ICD-10-CM

## 2020-01-23 LAB — LIPID PANEL
Chol/HDL Ratio: 2.6 ratio (ref 0.0–5.0)
Cholesterol, Total: 173 mg/dL (ref 100–199)
HDL: 66 mg/dL (ref >39)
LDL Chol Calc (NIH): 90 mg/dL (ref 0–99)
Triglycerides: 94 mg/dL (ref 0–149)
VLDL Cholesterol Cal: 17 mg/dL (ref 5–40)

## 2020-01-23 MED ORDER — AMLODIPINE BESYLATE 10 MG PO TABS: 10.0000 mg | ORAL_TABLET | Freq: Every day | ORAL | 3 refills | Status: DC

## 2020-01-23 NOTE — Progress Notes (Signed)
 Follow up visit  Subjective:   Gilbert [name], male    DOB: 11/12/1950, 69 y.o.   MRN: [id number]  HPI   Chief Complaint  Patient presents with  . Hyperlipidemia  . Congestive Heart Failure  . Coronary Artery Disease  . Hypertension  . Follow-up    4 week    69 y.o. Caucasian male  with CAD s/p MI, LM stenting 2015, ischemic cardiomyopathy with recovery in EF to 55% on guideline directed medical therapy, PAD s/p Left external iliac/common femoral stenting 2015, h/o left nephrectomy for renal cell carcinoma, h/o NHL, currently in remission.  He denies chest pain, shortness of breath, palpitations, leg edema, orthopnea, PND, TIA/syncope. Blood pressure was elevated at last visit. I had increased his amlodipine to 5 mg daily. It appears that he has been taking amlodipine 5 mg bid. Blood pressure is now better controlled. Recent lipid panel reviewed with the patient.   Current Outpatient Medications on File Prior to Visit  Medication Sig Dispense Refill  . acetaminophen (TYLENOL) 500 MG tablet Take 500 mg by mouth every 6 (six) hours as needed.    . atorvastatin (LIPITOR) 80 MG tablet Take 1 tablet (80 mg total) by mouth daily. 90 tablet 3  . carvedilol (COREG) 12.5 MG tablet Take 1 tablet by mouth twice daily 180 tablet 0  . clopidogrel (PLAVIX) 75 MG tablet Take 1 tablet (75 mg total) by mouth daily. 90 tablet 3  . Cyanocobalamin (B-12) 5000 MCG CAPS Take 5,000 mcg by mouth every morning.     . ENTRESTO 97-103 MG Take 1 tablet by mouth 2 (two) times daily. 180 tablet 3  . ezetimibe (ZETIA) 10 MG tablet Take 1 tablet (10 mg total) by mouth daily. 30 tablet 4  . famotidine (PEPCID) 20 MG tablet Take 20 mg by mouth 2 (two) times daily.    . furosemide (LASIX) 40 MG tablet TAKE 1/2 (ONE-HALF) TABLET BY MOUTH ONCE DAILY AS NEEDED 45 tablet 0  . phenytoin (DILANTIN) 300 MG ER capsule Take 1 capsule (300 mg total) by mouth daily. 90 capsule 3  . pyridOXINE (VITAMIN B-6) 100 MG tablet Take  100 mg by mouth every morning.     . Thiamine HCl (VITAMIN B-1) 250 MG tablet Take 250 mg by mouth every morning.     . amLODipine (NORVASC) 5 MG tablet Take 0.5 tablets (2.5 mg total) by mouth daily. (Patient taking differently: Take 5 mg by mouth 2 (two) times daily. ) 90 tablet 3   Current Facility-Administered Medications on File Prior to Visit  Medication Dose Route Frequency Provider Last Rate Last Admin  . sodium chloride flush (NS) 0.9 % injection 10 mL  10 mL Intravenous PRN Ennever, Peter R, MD   10 mL at 05/05/16 1127    Cardiovascular & other pertient studies:  EKG 03/22/2019: Sinus rhythm 60 bpm.  Old inferior infarct. Left ventricular hypertrophy. ST-T changes likely due to LVH  Echocardiogram 10/11/2017: Left ventricle cavity is normal in size. Moderate concentric hypertrophy of the left ventricle. Low normal LV systolic function. Visual EF is 50-55%. Doppler evidence of grade I (impaired) diastolic dysfunction, indeterminate LAP. Left ventricle regional wall motion findings: No wall motion abnormalities. Calculated EF 46%. Mild tricuspid regurgitation. Borderline pulmonary hypertension, PA pressure estimated at 31 mm Hg. Compared to the study done on 03/30/2017, LV EF is improved from 35% to the present 50-55%. Compared to hospital echo 06/02/17, EF improved from 20%. RV function was previously mildly reduced and small   pericardial effusion and left pleural effusion not present.  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 Right common carotid artery and right external carotid artery stenosis of (<50%). Stenosis in the left internal carotid artery (16-49%).  Antegrade right vertebral artery flow. Antegrade left vertebral artery flow. Compared to the study done on 01/27/2018,  right ICA stenosis is new.  Left ICA stenosis was >50%. F/U in 6 months.  Lexiscan thalium stress test 06/09/2017: 1. Prognostically, this is a high risk study. Resting EKG  demonstrates ectopic atrial rhythm and sinus rhythm with LVH with repolarization, cannot exclude lateral ischemia. Stress EKG is nondiagnostic for ischemia as is the pharmacologic stress test. Stress symptoms included dyspnea and dizziness. 2. There is a mild area of ischemia of small size in the septal wall.  There is very large scar of the inferolateral segment with very mild residual ischemia. 3. Compared to the prior study dated 05/09/2015, the current study now reveals Inferior scar with mild lateral ischemia new. EF has decreaed from 30% to 17%.  Recent labs: 01/22/2020: Chol 173, TG 94, HDL 66, LDL 90  07/11/2019: Glucose 86, BUN/Cr 13/1.25. EGFR 59. Na/K 140/3.4. Rest of the CMP normal H/H 12.7/36.1. MCV 87. Platelets 180 Chol 194, TG 136, HDL 59, LDL 111   Review of Systems  Cardiovascular: Negative for chest pain, dyspnea on exertion, leg swelling, palpitations and syncope.        Vitals:   01/23/20 1333  BP: 138/76  Pulse: 63  SpO2: 97%     Body mass index is 26 kg/m. Filed Weights   01/23/20 1333  Weight: 181 lb 3.2 oz (82.2 kg)    Objective:   Physical Exam Vitals and nursing note reviewed.  Constitutional:      General: He is not in acute distress. Neck:     Vascular: No JVD.  Cardiovascular:     Rate and Rhythm: Normal rate and regular rhythm.     Pulses: Intact distal pulses.          Dorsalis pedis pulses are 0 on the right side and 0 on the left side.       Posterior tibial pulses are 1+ on the right side and 1+ on the left side.     Heart sounds: Normal heart sounds. No murmur heard.   Pulmonary:     Effort: Pulmonary effort is normal.     Breath sounds: Normal breath sounds. No wheezing or rales.           Assessment & Recommendations:   69 y/o Caucasian male with CAD s/p MI, LM stenting 2015, ischemic cardiomyopathy with recovery in EF to 55% on guideline directed medical therapy, PAD s/p Left external iliac/common femoral stenting 2015,  h/o left nephrectomy for renal cell carcinoma, h/o NHL, currently in remission.  Hypertension: Better controlled on amlodipine 5 mg bid. Renewed to 10 mg daily.   Ischemic cardiomyopathy: EF recoverred to 50-55% on guiideline directed medical thrapy. Continue coreg 12.5 mg bid, Entresto 97-103 mg bid. Not on spironolactone due to improved NYHA symptoms and gynacomastia from spironolactone. Lasix as needed  CAD: Stable. No angina symptoms. Continue Aspirin 81 mg, lipitor 80 mg. Zetia. LDL 90, HDL 66. Not interested in PCSK9 inhibitor.   PAD: Stable.  F/u in 6 months   J , MD [facility] Pager: [phone] Office: [phone]    

## 2020-02-08 ENCOUNTER — Other Ambulatory Visit: Payer: Self-pay | Admitting: Cardiology

## 2020-02-08 DIAGNOSIS — E782 Mixed hyperlipidemia: Secondary | ICD-10-CM

## 2020-03-29 ENCOUNTER — Other Ambulatory Visit: Payer: Self-pay | Admitting: Cardiology

## 2020-03-29 DIAGNOSIS — I10 Essential (primary) hypertension: Secondary | ICD-10-CM

## 2020-04-08 ENCOUNTER — Other Ambulatory Visit: Payer: Self-pay | Admitting: Family Medicine

## 2020-04-08 DIAGNOSIS — G40909 Epilepsy, unspecified, not intractable, without status epilepticus: Secondary | ICD-10-CM

## 2020-04-08 NOTE — Telephone Encounter (Signed)
Requested medication (s) are due for refill today - no  Requested medication (s) are on the active medication list -yes  Future visit scheduled -yes  Last refill: 02/05/20  Notes to clinic: Request for non delegated Rx  Requested Prescriptions  Pending Prescriptions Disp Refills   phenytoin (DILANTIN) 300 MG ER capsule [Pharmacy Med Name: Phenytoin Sodium Extended 300 MG Oral Capsule] 90 capsule 0    Sig: Take 1 capsule by mouth once daily      Not Delegated - Neurology:  Anticonvulsants - phenytoin Failed - 04/08/2020  4:04 PM      Failed - This refill cannot be delegated      Failed - HGB in normal range and within 360 days    Hemoglobin  Date Value Ref Range Status  07/11/2019 12.7 (L) 13.0 - 17.0 g/dL Final   HGB  Date Value Ref Range Status  04/20/2017 10.4 (L) 13.0 - 17.1 g/dL Final          Failed - HCT in normal range and within 360 days    HCT  Date Value Ref Range Status  07/11/2019 36.1 (L) 39 - 52 % Final  04/20/2017 29.8 (L) 38 - 49 % Final          Failed - Phenytoin (serum) in normal range and within 360 days    Phenytoin, Total  Date Value Ref Range Status  08/13/2014 6.4 (L) 10.0 - 20.0 mg/L Final   Phenytoin, Serum  Date Value Ref Range Status  08/03/2016 7.2 (L) 10.0 - 20.0 ug/mL Final    Comment:                                    Detection Limit =  0.8                           <0.8 Indicates None Detected    Phenytoin Lvl  Date Value Ref Range Status  06/01/2017 3.7 (L) 10.0 - 20.0 ug/mL Final   Phenytoin, Free  Date Value Ref Range Status  05/04/2019 0.9 (L) 1.0 - 2.0 ug/mL Final    Comment:                                    Detection Limit = 0.5   Phenytoin Bound  Date Value Ref Range Status  08/13/2014 5.9 mg/L Final          Passed - ALT in normal range and within 360 days    ALT  Date Value Ref Range Status  07/11/2019 16 0 - 44 U/L Final   ALT(SGPT)  Date Value Ref Range Status  04/20/2017 27 10 - 47 U/L Final           Passed - AST in normal range and within 360 days    AST  Date Value Ref Range Status  07/11/2019 23 15 - 41 U/L Final          Passed - PLT in normal range and within 360 days    Platelets  Date Value Ref Range Status  04/20/2017 115 (L) 145 - 400 10e3/uL Final   Platelet Count  Date Value Ref Range Status  07/11/2019 180 150 - 400 K/uL Final   Platelet Count, POC  Date Value Ref Range Status  10/21/2015  182 142 - 424 K/uL Final          Passed - WBC in normal range and within 360 days    WBC  Date Value Ref Range Status  05/23/2018 11.5 (H) 4.0 - 10.5 K/uL Final   WBC Count  Date Value Ref Range Status  07/11/2019 7.5 4.0 - 10.5 K/uL Final          Passed - Valid encounter within last 12 months    Recent Outpatient Visits           11 months ago Hyperlipidemia, unspecified hyperlipidemia type   [facility], Ranell Patrick, MD   1 year ago Seizure disorder [facility])   [facility], Ranell Patrick, MD   2 years ago Acute upper respiratory infection   [facility], Ranell Patrick, MD   2 years ago Mass of left ear   [facility], Ranell Patrick, MD   2 years ago Congestive heart failure, unspecified HF chronicity, unspecified heart failure type [facility])   [facility], Ranell Patrick, MD       Future Appointments             In 4 weeks Carlota Raspberry Ranell Patrick, MD [facility] [place], [facility]   In 3 months Patwardhan, Reynold Bowen, MD [facility]                Requested Prescriptions  Pending Prescriptions Disp Refills   phenytoin (DILANTIN) 300 MG ER capsule [Pharmacy Med Name: Phenytoin Sodium Extended 300 MG Oral Capsule] 90 capsule 0    Sig: Take 1 capsule by mouth once daily      Not Delegated - Neurology:  Anticonvulsants - phenytoin Failed - 04/08/2020  4:04 PM      Failed - This refill cannot be delegated      Failed - HGB in normal range and within 360 days    Hemoglobin   Date Value Ref Range Status  07/11/2019 12.7 (L) 13.0 - 17.0 g/dL Final   HGB  Date Value Ref Range Status  04/20/2017 10.4 (L) 13.0 - 17.1 g/dL Final          Failed - HCT in normal range and within 360 days    HCT  Date Value Ref Range Status  07/11/2019 36.1 (L) 39 - 52 % Final  04/20/2017 29.8 (L) 38 - 49 % Final          Failed - Phenytoin (serum) in normal range and within 360 days    Phenytoin, Total  Date Value Ref Range Status  08/13/2014 6.4 (L) 10.0 - 20.0 mg/L Final   Phenytoin, Serum  Date Value Ref Range Status  08/03/2016 7.2 (L) 10.0 - 20.0 ug/mL Final    Comment:                                    Detection Limit =  0.8                           <0.8 Indicates None Detected    Phenytoin Lvl  Date Value Ref Range Status  06/01/2017 3.7 (L) 10.0 - 20.0 ug/mL Final   Phenytoin, Free  Date Value Ref Range Status  05/04/2019 0.9 (L) 1.0 - 2.0 ug/mL Final    Comment:  Detection Limit = 0.5   Phenytoin Bound  Date Value Ref Range Status  08/13/2014 5.9 mg/L Final          Passed - ALT in normal range and within 360 days    ALT  Date Value Ref Range Status  07/11/2019 16 0 - 44 U/L Final   ALT(SGPT)  Date Value Ref Range Status  04/20/2017 27 10 - 47 U/L Final          Passed - AST in normal range and within 360 days    AST  Date Value Ref Range Status  07/11/2019 23 15 - 41 U/L Final          Passed - PLT in normal range and within 360 days    Platelets  Date Value Ref Range Status  04/20/2017 115 (L) 145 - 400 10e3/uL Final   Platelet Count  Date Value Ref Range Status  07/11/2019 180 150 - 400 K/uL Final   Platelet Count, POC  Date Value Ref Range Status  10/21/2015 182 142 - 424 K/uL Final          Passed - WBC in normal range and within 360 days    WBC  Date Value Ref Range Status  05/23/2018 11.5 (H) 4.0 - 10.5 K/uL Final   WBC Count  Date Value Ref Range Status  07/11/2019 7.5 4.0  - 10.5 K/uL Final          Passed - Valid encounter within last 12 months    Recent Outpatient Visits           11 months ago Hyperlipidemia, unspecified hyperlipidemia type   [facility], Ranell Patrick, MD   1 year ago Seizure disorder [facility])   [facility], Ranell Patrick, MD   2 years ago Acute upper respiratory infection   [facility], Ranell Patrick, MD   2 years ago Mass of left ear   [facility], Ranell Patrick, MD   2 years ago Congestive heart failure, unspecified HF chronicity, unspecified heart failure type [facility])   [facility], Ranell Patrick, MD       Future Appointments             In 4 weeks Carlota Raspberry Ranell Patrick, MD [facility] at [place], [facility]   In 3 months Patwardhan, Reynold Bowen, MD [facility]

## 2020-04-13 ENCOUNTER — Other Ambulatory Visit: Payer: Self-pay | Admitting: Cardiology

## 2020-05-07 ENCOUNTER — Encounter: Payer: Medicare Other | Admitting: Family Medicine

## 2020-05-18 ENCOUNTER — Other Ambulatory Visit: Payer: Self-pay | Admitting: Cardiology

## 2020-05-18 DIAGNOSIS — I2510 Atherosclerotic heart disease of native coronary artery without angina pectoris: Secondary | ICD-10-CM

## 2020-05-26 ENCOUNTER — Other Ambulatory Visit: Payer: Self-pay | Admitting: Cardiology

## 2020-05-26 DIAGNOSIS — I10 Essential (primary) hypertension: Secondary | ICD-10-CM

## 2020-06-20 ENCOUNTER — Other Ambulatory Visit: Payer: Self-pay

## 2020-06-20 ENCOUNTER — Encounter: Payer: Self-pay | Admitting: Registered Nurse

## 2020-06-20 ENCOUNTER — Ambulatory Visit (INDEPENDENT_AMBULATORY_CARE_PROVIDER_SITE_OTHER): Payer: Medicare Other | Admitting: Registered Nurse

## 2020-06-20 DIAGNOSIS — G40909 Epilepsy, unspecified, not intractable, without status epilepticus: Secondary | ICD-10-CM | POA: Diagnosis not present

## 2020-06-20 MED ORDER — PHENYTOIN SODIUM EXTENDED 300 MG PO CAPS: 300.0000 mg | ORAL_CAPSULE | Freq: Every day | ORAL | 1 refills | Status: DC

## 2020-06-20 NOTE — Patient Instructions (Signed)
° ° ° °  If you have lab work done today you will be contacted with your lab results within the next 2 weeks.  If you have not heard from us then please contact us. The fastest way to get your results is to register for My Chart. ° ° °IF you received an x-ray today, you will receive an invoice from [facility]. Please contact [facility] at [phone] with questions or concerns regarding your invoice.  ° °IF you received labwork today, you will receive an invoice from [facility]. Please contact [facility] at [phone] with questions or concerns regarding your invoice.  ° °Our billing staff will not be able to assist you with questions regarding bills from these companies. ° °You will be contacted with the lab results as soon as they are available. The fastest way to get your results is to activate your My Chart account. Instructions are located on the last page of this paperwork. If you have not heard from us regarding the results in 2 weeks, please contact this office. °  ° ° ° °

## 2020-06-20 NOTE — Progress Notes (Signed)
Established Patient Office Visit  Subjective:  Patient ID: Gilbert [name], male    DOB: [birth year]  Age: 69 y.o. MRN: [id number]  CC:  Chief Complaint  Patient presents with  . Annual Exam    Patient states he is here for annual CPE. Per patient he has no questions or concerns.    HPI [name] presents for med check  Needs refill on dilantin. Tolerates well. No recent seizures.  Follows with onc q17mo. No concerns. Gets labs frequently through their office. Declines labs today, discussed r/b/se of this.   Follows with cardiology q92mo. Has upcoming appt on Dec 27.   No acute concerns. Feeling well overall.  Past Medical History:  Diagnosis Date  . Abnormal liver enzymes    Chronic alkaline phosphatase elevation since 2014  . Acid reflux   . CAD (coronary artery disease)   . Cancer ([place]) 08/2013   right kidney cancer  . Cancer ([place])   . Coronary artery calcification seen on CAT scan   . Coronary atherosclerosis of native coronary artery   . 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 Essential hypertension, benign   . Family history of anesthesia complication    mother-had stroke during anesthesia  . GERD (gastroesophageal reflux disease)   . Gout   . History of cardiac arrest 11/26/13   PTCA/Stenting of LM & Prox LAD with 4.0 x 18 mm Xience alpine stent. Used Impella Circulatory assist device. EF= 20-25%  . History of epilepsy    at least 10 years ago. 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 History of myocardial infarction less than 8 weeks    Stent placed  . History of nephrectomy 11/2013   For renal cell carcinoma  . History of tobacco use   . HTN (hypertension)   . Hypercholesteremia   . Hyperlipemia   . Hypertension   . PAD (peripheral artery disease) ([place])   . Seizures ([place])   . Shortness of breath    with activity  . SOB (shortness of breath)   . Systolic and diastolic CHF, chronic  (H[facility])    Resolved. EF 45% 04/10/2014  . Therapeutic drug monitoring     Past Surgical History:  Procedure Laterality Date  . BALLOON DILATION N/A 01/21/2015   Procedure: BALLOON DILATION;  Surgeon: Gatha Mayer, MD;  Location: [facility] ENDOSCOPY;  Service: Endoscopy;  Laterality: N/A;  . CARDIAC CATHETERIZATION N/A 06/10/2015   Procedure: Left Heart Cath and Coronary Angiography;  Surgeon: Adrian Prows, MD;  Location: [place] CV LAB;  Service: Cardiovascular;  Laterality: N/A;  . CORONARY ANGIOPLASTY WITH STENT PLACEMENT  2015   Left main coronary artery stenting on emergent basis along with circulatory support with Impella device through left leg. With 4.0 x 18 mm Xience alpine stent  . EAR CYST EXCISION Left 05/31/2018   Procedure: EXCISION LEFT POSTERIOR EAR TUMOR;  Surgeon: Leta Baptist, MD;  Location: [place];  Service: ENT;  Laterality: Left;  . ESOPHAGOGASTRODUODENOSCOPY    . ESOPHAGOGASTRODUODENOSCOPY N/A 01/21/2015   Procedure: ESOPHAGOGASTRODUODENOSCOPY (EGD) with possible dilation.;  Surgeon: Gatha Mayer, MD;  Location: [facility] ENDOSCOPY;  Service: Endoscopy;  Laterality: N/A;  . EYE SURGERY Left 2 weeks ago   torn retina  . ILIAC ARTERY STENT Left 05/28/2014   dr Einar Gip  . LEFT HEART CATHETERIZATION WITH CORONARY ANGIOGRAM N/A 11/27/2013   Procedure: LEFT HEART CATHETERIZATION WITH CORONARY ANGIOGRAM;  Surgeon: [name], MD;  Location: [place] CATH LAB;  Service: Cardiovascular;  Laterality: N/A;  . LOWER EXTREMITY ANGIOGRAM N/A 02/26/2014   Procedure: LOWER EXTREMITY ANGIOGRAM;  Surgeon: [name], MD;  Location: [facility] CATH LAB;  Service: Cardiovascular;  Laterality: N/A;  . LOWER EXTREMITY ANGIOGRAM N/A 05/28/2014   Procedure: LOWER EXTREMITY ANGIOGRAM;  Surgeon: [name], MD;  Location: [facility] CATH LAB;  Service: Cardiovascular;  Laterality: N/A;  . NEPHRECTOMY  11/2013  . NO PAST SURGERIES    . PERCUTANEOUS CORONARY STENT INTERVENTION (PCI-S)  11/27/2013   Procedure:  PERCUTANEOUS CORONARY STENT INTERVENTION (PCI-S);  Surgeon: [name], MD;  Location: [facility] CATH LAB;  Service: Cardiovascular;;  . ROBOT ASSISTED LAPAROSCOPIC NEPHRECTOMY Right 10/19/2013   Procedure: ROBOTIC ASSISTED LAPAROSCOPIC NEPHRECTOMY,  EXTENSIVE ADHESIOLYSIS;  Surgeon: Alexis Frock, MD;  Location: [facility] ORS;  Service: Urology;  Laterality: Right;  . SKIN FULL THICKNESS GRAFT Left 05/31/2018   Procedure: SKIN GRAFT FULL THICKNESS FROM LEFT ABDOMEN TO LEFT EAR;  Surgeon: Leta Baptist, MD;  Location: [facility] OR;  Service: ENT;  Laterality: Left;    Family History  Problem Relation Age of Onset  . Stroke Mother   . Heart disease Brother        multiple stents  . Hyperlipidemia Brother   . Emphysema Father     Social History   Socioeconomic History  . Marital status: Divorced    Spouse name: Not on file  . Number of children: 2  . Years of education: Not on file  . Highest education level: Not on file  Occupational History  . Not on file  Tobacco Use  . Smoking status: Former Smoker    Packs/day: 2.00    Years: 45.00    Pack years: 90.00    Types: Cigarettes    Quit date: 08/03/2003    Years since quitting: 16.8  . Smokeless tobacco: Current User    Types: Snuff  . Tobacco comment: quit  smoking 10 years agop  Vaping Use  . Vaping Use: Never used  Substance and Sexual Activity  . Alcohol use: No    Alcohol/week: 0.0 standard drinks    Comment: quit 1980  . Drug use: No    Comment: none in past 30 years   . Sexual activity: Not on file  Other Topics Concern  . Not on file  Social History Narrative   ** Merged History Encounter **       Social Determinants of Health   Financial Resource Strain:   . Difficulty of Paying Living Expenses: Not on file  Food Insecurity:   . Worried About Charity fundraiser in the Last Year: Not on file  . Ran Out of Food in the Last Year: Not on file  Transportation Needs:   . Lack of Transportation (Medical): Not on file  . Lack  of Transportation (Non-Medical): Not on file  Physical Activity:   . Days of Exercise per Week: Not on file  . Minutes of Exercise per Session: Not on file  Stress:   . Feeling of Stress : Not on file  Social Connections:   . Frequency of Communication with Friends and Family: Not on file  . Frequency of Social Gatherings with Friends and Family: Not on file  . Attends Religious Services: Not on file  . Active Member of Clubs or Organizations: Not on file  . Attends Archivist Meetings: Not on file  . Marital Status: Not on file  Intimate Partner Violence:   .  Fear of Current or Ex-Partner: Not on file  . Emotionally Abused: Not on file  . Physically Abused: Not on file  . Sexually Abused: Not on file    Outpatient Medications Prior to Visit  Medication Sig Dispense Refill  . acetaminophen (TYLENOL) 500 MG tablet Take 500 mg by mouth every 6 (six) hours as needed.    Marland Kitchen amLODipine (NORVASC) 10 MG tablet Take 1 tablet (10 mg total) by mouth daily. 90 tablet 3  . atorvastatin (LIPITOR) 80 MG tablet Take 1 tablet (80 mg total) by mouth daily. 90 tablet 3  . carvedilol (COREG) 12.5 MG tablet TAKE 1 TABLET BY MOUTH TWICE DAILY WITH MEALS 180 tablet 0  . clopidogrel (PLAVIX) 75 MG tablet Take 1 tablet by mouth once daily 90 tablet 0  . Cyanocobalamin (B-12) 5000 MCG CAPS Take 5,000 mcg by mouth every morning.     Marland Kitchen ENTRESTO 97-103 MG Take 1 tablet by mouth twice daily 60 tablet 6  . ezetimibe (ZETIA) 10 MG tablet Take 1 tablet by mouth once daily 30 tablet 6  . famotidine (PEPCID) 20 MG tablet Take 20 mg by mouth 2 (two) times daily.    . furosemide (LASIX) 40 MG tablet TAKE 1/2 (ONE-HALF) TABLET BY MOUTH ONCE DAILY AS NEEDED 45 tablet 0  . pyridOXINE (VITAMIN B-6) 100 MG tablet Take 100 mg by mouth every morning.     . Thiamine HCl (VITAMIN B-1) 250 MG tablet Take 250 mg by mouth every morning.     . phenytoin (DILANTIN) 300 MG ER capsule Take 1 capsule by mouth once daily 90  capsule 0   Facility-Administered Medications Prior to Visit  Medication Dose Route Frequency Provider Last Rate Last Admin  . sodium chloride flush (NS) 0.9 % injection 10 mL  10 mL Intravenous PRN Volanda Napoleon, MD   10 mL at 05/05/16 1127    Allergies  Allergen Reactions  . Oxycodone Swelling and Other (See Comments)    Tongue and lips swell    ROS Review of Systems  Constitutional: Negative.   HENT: Negative.   Eyes: Negative.   Respiratory: Negative.   Cardiovascular: Negative.   Gastrointestinal: Negative.   Genitourinary: Negative.   Musculoskeletal: Negative.   Skin: Negative.   Neurological: Negative.   Psychiatric/Behavioral: Negative.       Objective:    Physical Exam Constitutional:      General: He is not in acute distress.    Appearance: Normal appearance. He is normal weight. He is not ill-appearing, toxic-appearing or diaphoretic.  Cardiovascular:     Rate and Rhythm: Normal rate and regular rhythm.     Heart sounds: Normal heart sounds. No murmur heard.  No friction rub. No gallop.   Pulmonary:     Effort: Pulmonary effort is normal. No respiratory distress.     Breath sounds: Normal breath sounds. No stridor. No wheezing, rhonchi or rales.  Chest:     Chest wall: No tenderness.  Neurological:     General: No focal deficit present.     Mental Status: He is alert and oriented to person, place, and time. Mental status is at baseline.  Psychiatric:        Mood and Affect: Mood normal.        Behavior: Behavior normal.        Thought Content: Thought content normal.        Judgment: Judgment normal.     BP (!) 158/69   Pulse 61  Temp 98 F (36.7 C) (Temporal)   Resp 18   Ht 5\' 10"  (1.778 m)   Wt 181 lb 12.8 oz (82.5 kg)   SpO2 97%   BMI 26.09 kg/m  Wt Readings from Last 3 Encounters:  06/20/20 181 lb 12.8 oz (82.5 kg)  01/23/20 181 lb 3.2 oz (82.2 kg)  12/24/19 179 lb 14.4 oz (81.6 kg)     Health Maintenance Due  Topic Date Due   . PNA vac Low Risk Adult (2 of 2 - PPSV23) 01/23/2020    There are no preventive care reminders to display for this patient.  Lab Results  Component Value Date   TSH 0.961 11/27/2013   Lab Results  Component Value Date   WBC 7.5 07/11/2019   HGB 12.7 (L) 07/11/2019   HCT 36.1 (L) 07/11/2019   MCV 87.4 07/11/2019   PLT 180 07/11/2019   Lab Results  Component Value Date   NA 140 07/11/2019   K 3.4 (L) 07/11/2019   CO2 29 07/11/2019   GLUCOSE 86 07/11/2019   BUN 13 07/11/2019   CREATININE 1.25 (H) 07/11/2019   BILITOT 0.4 07/11/2019   ALKPHOS 115 07/11/2019   AST 23 07/11/2019   ALT 16 07/11/2019   PROT 6.2 (L) 07/11/2019   ALBUMIN 3.9 07/11/2019   CALCIUM 8.8 (L) 07/11/2019   ANIONGAP 7 07/11/2019   GFR 57.51 (L) 12/19/2014   Lab Results  Component Value Date   CHOL 173 01/22/2020   Lab Results  Component Value Date   HDL 66 01/22/2020   Lab Results  Component Value Date   LDLCALC 90 01/22/2020   Lab Results  Component Value Date   TRIG 94 01/22/2020   Lab Results  Component Value Date   CHOLHDL 2.6 01/22/2020   Lab Results  Component Value Date   HGBA1C 5.1 08/13/2014      Assessment & Plan:   Problem List Items Addressed This Visit    None    Visit Diagnoses    Seizure disorder ([place])       Relevant Medications   phenytoin (DILANTIN) 300 MG ER capsule      Meds ordered this encounter  Medications  . phenytoin (DILANTIN) 300 MG ER capsule    Sig: Take 1 capsule (300 mg total) by mouth daily.    Dispense:  90 capsule    Refill:  1    Order Specific Question:   Supervising Provider    Answer:   Carlota Raspberry, JEFFREY R [[id number]]    Follow-up: Return in about 6 months (around 12/18/2020).   PLAN  No acute concerns on brief exam  Given pt stated hx he is doing well without any acute changes overall.  Return in 6 mo to see PCP Dr. Carlota Raspberry  Refill dilantin until that time  Patient encouraged to call clinic with any questions, comments, or  concerns.   Maximiano Coss, NP

## 2020-06-27 ENCOUNTER — Encounter: Payer: Self-pay | Admitting: Registered Nurse

## 2020-06-30 NOTE — Telephone Encounter (Signed)
Pt daughter asking about fecal testing, no orders referral or mention of needing attention please advise

## 2020-07-28 ENCOUNTER — Ambulatory Visit: Payer: Medicare Other | Admitting: Cardiology

## 2020-07-28 NOTE — Progress Notes (Deleted)
Follow up visit  Subjective:   Gilbert [name], male    DOB: Nov 28, 1950, 69 y.o.   MRN: [id number]  HPI   No chief complaint on file.   69 y.o. Caucasian male  with CAD s/p MI, LM stenting 2015, ischemic cardiomyopathy with recovery in EF to 55% on guideline directed medical therapy, PAD s/p Left external iliac/common femoral stenting 2015, h/o left nephrectomy for renal cell carcinoma, h/o NHL, currently in remission.  He denies chest pain, shortness of breath, palpitations, leg edema, orthopnea, PND, TIA/syncope. Blood pressure was elevated at last visit. I had increased his amlodipine to 5 mg daily. It appears that he has been taking amlodipine 5 mg bid. Blood pressure is now better controlled. Recent lipid panel reviewed with the patient.   Current Outpatient Medications on File Prior to Visit  Medication Sig Dispense Refill  . acetaminophen (TYLENOL) 500 MG tablet Take 500 mg by mouth every 6 (six) hours as needed.    Marland Kitchen amLODipine (NORVASC) 10 MG tablet Take 1 tablet (10 mg total) by mouth daily. 90 tablet 3  . atorvastatin (LIPITOR) 80 MG tablet Take 1 tablet (80 mg total) by mouth daily. 90 tablet 3  . carvedilol (COREG) 12.5 MG tablet TAKE 1 TABLET BY MOUTH TWICE DAILY WITH MEALS 180 tablet 0  . clopidogrel (PLAVIX) 75 MG tablet Take 1 tablet by mouth once daily 90 tablet 0  . Cyanocobalamin (B-12) 5000 MCG CAPS Take 5,000 mcg by mouth every morning.     Marland Kitchen ENTRESTO 97-103 MG Take 1 tablet by mouth twice daily 60 tablet 6  . ezetimibe (ZETIA) 10 MG tablet Take 1 tablet by mouth once daily 30 tablet 6  . famotidine (PEPCID) 20 MG tablet Take 20 mg by mouth 2 (two) times daily.    . furosemide (LASIX) 40 MG tablet TAKE 1/2 (ONE-HALF) TABLET BY MOUTH ONCE DAILY AS NEEDED 45 tablet 0  . phenytoin (DILANTIN) 300 MG ER capsule Take 1 capsule (300 mg total) by mouth daily. 90 capsule 1  . pyridOXINE (VITAMIN B-6) 100 MG tablet Take 100 mg by mouth every morning.     . Thiamine HCl  (VITAMIN B-1) 250 MG tablet Take 250 mg by mouth every morning.      Current Facility-Administered Medications on File Prior to Visit  Medication Dose Route Frequency Provider Last Rate Last Admin  . sodium chloride flush (NS) 0.9 % injection 10 mL  10 mL Intravenous PRN Volanda Napoleon, MD   10 mL at 05/05/16 1127    Cardiovascular & other pertient studies:  EKG 03/22/2019: Sinus rhythm 60 bpm.  Old inferior infarct. Left ventricular hypertrophy. ST-T changes likely due to LVH  Echocardiogram 10/11/2017: Left ventricle cavity is normal in size. Moderate concentric hypertrophy of the left ventricle. Low normal LV systolic function. Visual EF is 50-55%. Doppler evidence of grade I (impaired) diastolic dysfunction, indeterminate LAP. Left ventricle regional wall motion findings: No wall motion abnormalities. Calculated EF 46%. Mild tricuspid regurgitation. Borderline pulmonary hypertension, PA pressure estimated at 31 mm Hg. Compared to the study done on 03/30/2017, LV EF is improved from 35% to the present 50-55%. Compared to hospital echo 06/02/17, EF improved from 20%. RV function was previously mildly reduced and small pericardial effusion and left pleural effusion not present.  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 Right common carotid artery and right external carotid artery stenosis of (<50%). Stenosis in the left internal carotid artery (16-49%).  Antegrade right  vertebral artery flow. Antegrade left vertebral artery flow. Compared to the study done on 01/27/2018,  right ICA stenosis is new.  Left ICA stenosis was >50%. F/U in 6 months.  Lexiscan thalium stress test 06/09/2017: 1. Prognostically, this is a high risk study. Resting EKG demonstrates ectopic atrial rhythm and sinus rhythm with LVH with repolarization, cannot exclude lateral ischemia. Stress EKG is nondiagnostic for ischemia as is the pharmacologic stress test. Stress symptoms included  dyspnea and dizziness. 2. There is a mild area of ischemia of small size in the septal wall.  There is very large scar of the inferolateral segment with very mild residual ischemia. 3. Compared to the prior study dated 05/09/2015, the current study now reveals Inferior scar with mild lateral ischemia new. EF has decreaed from 30% to 17%.  Recent labs: 01/22/2020: Chol 173, TG 94, HDL 66, LDL 90  07/11/2019: Glucose 86, BUN/Cr 13/1.25. EGFR 59. Na/K 140/3.4. Rest of the CMP normal H/H 12.7/36.1. MCV 87. Platelets 180 Chol 194, TG 136, HDL 59, LDL 111   Review of Systems  Cardiovascular: Negative for chest pain, dyspnea on exertion, leg swelling, palpitations and syncope.        There were no vitals filed for this visit.   There is no height or weight on file to calculate BMI. There were no vitals filed for this visit.  Objective:   Physical Exam Vitals and nursing note reviewed.  Constitutional:      General: He is not in acute distress. Neck:     Vascular: No JVD.  Cardiovascular:     Rate and Rhythm: Normal rate and regular rhythm.     Pulses: Intact distal pulses.          Dorsalis pedis pulses are 0 on the right side and 0 on the left side.       Posterior tibial pulses are 1+ on the right side and 1+ on the left side.     Heart sounds: Normal heart sounds. No murmur heard.   Pulmonary:     Effort: Pulmonary effort is normal.     Breath sounds: Normal breath sounds. No wheezing or rales.           Assessment & Recommendations:   69 y/o Caucasian male with CAD s/p MI, LM stenting 2015, ischemic cardiomyopathy with recovery in EF to 55% on guideline directed medical therapy, PAD s/p Left external iliac/common femoral stenting 2015, h/o left nephrectomy for renal cell carcinoma, h/o NHL, currently in remission.  Hypertension: Better controlled on amlodipine 5 mg bid. Renewed to 10 mg daily.   Ischemic cardiomyopathy: EF recoverred to 50-55% on guiideline  directed medical thrapy. Continue coreg 12.5 mg bid, Entresto 97-103 mg bid. Not on spironolactone due to improved NYHA symptoms and gynacomastia from spironolactone. Lasix as needed  CAD: Stable. No angina symptoms. Continue Aspirin 81 mg, lipitor 80 mg. Zetia. LDL 90, HDL 66. Not interested in PCSK9 inhibitor.   PAD: Stable.  F/u in 6 months  Kyri Dai Esther Hardy, MD [facility] Pager: [phone] Office: [phone]

## 2020-07-29 ENCOUNTER — Other Ambulatory Visit: Payer: Self-pay

## 2020-07-29 ENCOUNTER — Encounter: Payer: Self-pay | Admitting: Cardiology

## 2020-07-29 ENCOUNTER — Ambulatory Visit: Payer: Medicare Other | Admitting: Cardiology

## 2020-07-29 ENCOUNTER — Other Ambulatory Visit: Payer: Self-pay | Admitting: Cardiology

## 2020-07-29 VITALS — BP 142/76 | HR 65 | Resp 16 | Ht 70.0 in | Wt 184.0 lb

## 2020-07-29 DIAGNOSIS — I739 Peripheral vascular disease, unspecified: Secondary | ICD-10-CM

## 2020-07-29 DIAGNOSIS — I10 Essential (primary) hypertension: Secondary | ICD-10-CM

## 2020-07-29 DIAGNOSIS — I5022 Chronic systolic (congestive) heart failure: Secondary | ICD-10-CM

## 2020-07-29 DIAGNOSIS — I2510 Atherosclerotic heart disease of native coronary artery without angina pectoris: Secondary | ICD-10-CM

## 2020-07-29 MED ORDER — CARVEDILOL 12.5 MG PO TABS: 12.5000 mg | ORAL_TABLET | Freq: Two times a day (BID) | ORAL | 3 refills | Status: DC

## 2020-07-29 MED ORDER — FUROSEMIDE 40 MG PO TABS
ORAL_TABLET | ORAL | 0 refills | Status: DC
Start: 2020-07-29 — End: 2020-07-30

## 2020-07-29 MED ORDER — CLOPIDOGREL BISULFATE 75 MG PO TABS: 75.0000 mg | ORAL_TABLET | Freq: Every day | ORAL | 0 refills | Status: DC

## 2020-07-29 NOTE — Progress Notes (Signed)
Follow up visit  Subjective:   Gilbert [name], male    DOB: June 07, 1951, 69 y.o.   MRN: [id number]  Coronary Artery Disease Pertinent negatives include no chest pain, dizziness, leg swelling, palpitations, shortness of breath or weight gain. Risk factors include hypertension.  Hypertension Pertinent negatives include no chest pain, malaise/fatigue, orthopnea, palpitations, PND or shortness of breath.     Chief Complaint  Patient presents with   Coronary Artery Disease   Hypertension   Follow-up    69 y.o. Caucasian male  with CAD s/p MI, LM stenting 2015, ischemic cardiomyopathy with recovery in EF to 55% on guideline directed medical therapy, PAD s/p Left external iliac/common femoral stenting 2015, h/o left nephrectomy for renal cell carcinoma, h/o NHL, currently in remission.  He denies chest pain, shortness of breath, palpitations, leg edema, orthopnea, PND, TIA/syncope. Patient denies symptoms of claudication. He reports home blood pressure readings which are well controlled. However, patient has been without his amlodipine for at least the last 2-3 days. He continues to golf regularly without issue.   Current Outpatient Medications on File Prior to Visit  Medication Sig Dispense Refill   acetaminophen (TYLENOL) 500 MG tablet Take 500 mg by mouth every 6 (six) hours as needed.     amLODipine (NORVASC) 10 MG tablet Take 1 tablet (10 mg total) by mouth daily. 90 tablet 3   atorvastatin (LIPITOR) 80 MG tablet Take 1 tablet (80 mg total) by mouth daily. 90 tablet 3   Cyanocobalamin (B-12) 5000 MCG CAPS Take 5,000 mcg by mouth every morning.      ENTRESTO 97-103 MG Take 1 tablet by mouth twice daily 60 tablet 6   ezetimibe (ZETIA) 10 MG tablet Take 1 tablet by mouth once daily 30 tablet 6   famotidine (PEPCID) 20 MG tablet Take 20 mg by mouth 2 (two) times daily.     phenytoin (DILANTIN) 300 MG ER capsule Take 1 capsule (300 mg total) by mouth daily. 90 capsule 1    pyridOXINE (VITAMIN B-6) 100 MG tablet Take 100 mg by mouth every morning.      Thiamine HCl (VITAMIN B-1) 250 MG tablet Take 250 mg by mouth every morning.      Current Facility-Administered Medications on File Prior to Visit  Medication Dose Route Frequency Provider Last Rate Last Admin   sodium chloride flush (NS) 0.9 % injection 10 mL  10 mL Intravenous PRN Volanda Napoleon, MD   10 mL at 05/05/16 1127    Cardiovascular & other pertient studies:  EKG 07/29/2020: Sinus rhythm at a rate of 63 bpm. Normal axis. Left ventricular hypertrophy Inferior infarct, old ST-T wave changes, likely secondary to LVH  EKG 03/22/2019: Sinus rhythm 60 bpm.  Old inferior infarct. Left ventricular hypertrophy. ST-T changes likely due to LVH  Carotid artery duplex 02/08/2019:  Minimal heterogeneous plaque noted in the right carotid artery.  Minimal stenosis in the left internal carotid artery (1-15%).  Right vertebral artery flow is not visualized. Antegrade left vertebral artery flow.  Compared to the study done on 08/10/2018, right ICA stenosis of 50-69% and left ICA stenosis of 16-49% is not present. Follow up studies when clinically indicated.  Echocardiogram 10/11/2017: Left ventricle cavity is normal in size. Moderate concentric hypertrophy of the left ventricle. Low normal LV systolic function. Visual EF is 50-55%. Doppler evidence of grade I (impaired) diastolic dysfunction, indeterminate LAP. Left ventricle regional wall motion findings: No wall motion abnormalities. Calculated EF 46%. Mild tricuspid regurgitation. Borderline pulmonary  hypertension, PA pressure estimated at 31 mm Hg. Compared to the study done on 03/30/2017, LV EF is improved from 35% to the present 50-55%. Compared to hospital echo 06/02/17, EF improved from 20%. RV function was previously mildly reduced and small pericardial effusion and left pleural effusion not present.  Carotid artery duplex 08/10/2018: Stenosis in  the right ICA of 50-69%. Right common carotid artery and right external carotid artery stenosis of (<50%). Stenosis in the left internal carotid artery (16-49%).  Antegrade right vertebral artery flow. Antegrade left vertebral artery flow. Compared to the study done on 01/27/2018,  right ICA stenosis is new.  Left ICA stenosis was >50%. F/U in 6 months.  Lexiscan thalium stress test 06/09/2017: 1. Prognostically, this is a high risk study. Resting EKG demonstrates ectopic atrial rhythm and sinus rhythm with LVH with repolarization, cannot exclude lateral ischemia. Stress EKG is nondiagnostic for ischemia as is the pharmacologic stress test. Stress symptoms included dyspnea and dizziness. 2. There is a mild area of ischemia of small size in the septal wall.  There is very large scar of the inferolateral segment with very mild residual ischemia. 3. Compared to the prior study dated 05/09/2015, the current study now reveals Inferior scar with mild lateral ischemia new. EF has decreaed from 30% to 17%.  Recent labs: 01/22/2020: Chol 173, TG 94, HDL 66, LDL 90  07/11/2019: Glucose 86, BUN/Cr 13/1.25. EGFR 59. Na/K 140/3.4. Rest of the CMP normal H/H 12.7/36.1. MCV 87. Platelets 180 Chol 194, TG 136, HDL 59, LDL 111   Review of Systems  Constitutional: Negative for malaise/fatigue and weight gain.  Cardiovascular: Negative for chest pain, claudication, leg swelling, near-syncope, orthopnea, palpitations, paroxysmal nocturnal dyspnea and syncope.  Respiratory: Negative for shortness of breath.   Hematologic/Lymphatic: Does not bruise/bleed easily.  Gastrointestinal: Negative for melena.  Neurological: Negative for dizziness and weakness.        Vitals:   07/29/20 1509  BP: (!) 142/76  Pulse: 65  Resp: 16  SpO2: 95%     Body mass index is 26.4 kg/m. Filed Weights   07/29/20 1509  Weight: 184 lb (83.5 kg)    Objective:   Physical Exam Vitals and nursing note reviewed.   Constitutional:      General: He is not in acute distress. HENT:     Head: Normocephalic and atraumatic.  Neck:     Vascular: No JVD.  Cardiovascular:     Rate and Rhythm: Normal rate and regular rhythm.     Pulses: Intact distal pulses.          Dorsalis pedis pulses are 0 on the right side and 0 on the left side.       Posterior tibial pulses are 1+ on the right side and 1+ on the left side.     Heart sounds: Normal heart sounds, S1 normal and S2 normal. No murmur heard. No gallop.   Pulmonary:     Effort: Pulmonary effort is normal. No respiratory distress.     Breath sounds: Normal breath sounds. No wheezing, rhonchi or rales.  Musculoskeletal:     Right lower leg: No edema.     Left lower leg: No edema.  Neurological:     Mental Status: He is alert.        Assessment & Recommendations:   69 y/o Caucasian male with CAD s/p MI, LM stenting 2015, ischemic cardiomyopathy with recovery in EF to 55% on guideline directed medical therapy, PAD s/p Left external iliac/common  femoral stenting 2015, h/o left nephrectomy for renal cell carcinoma, h/o NHL, currently in remission.  Hypertension: Elevated in office today, but he has been without amlodipine and reports home well controlled home blood pressure readings.  Continue amlodipine 10 mg daily   Ischemic cardiomyopathy: EF recoverred to 50-55% on guiideline directed medical thrapy. Continue coreg 12.5 mg bid, Entresto 97-103 mg bid. Not on spironolactone due to improved NYHA symptoms and gynacomastia from spironolactone. Continue Lasix as needed Will initiate application for Entresto patient assistance program   CAD: Stable. No angina symptoms. Patient unable to tolerate aspirin due to GI side effects  Continue lipitor 80 mg, Zetia. LDL 90, HDL 66. Discussed PCSK9 inhibitor, patient is still not interested.  PAD: Stable. Continue Plavix 75 mg daily    F/u in 6 months    Alethia Berthold, PA-C 07/29/2020, 4:33  PM Office: [phone]

## 2020-09-29 ENCOUNTER — Other Ambulatory Visit: Payer: Self-pay | Admitting: Cardiology

## 2020-09-29 DIAGNOSIS — E782 Mixed hyperlipidemia: Secondary | ICD-10-CM

## 2020-10-29 ENCOUNTER — Other Ambulatory Visit: Payer: Self-pay | Admitting: Cardiology

## 2020-10-29 DIAGNOSIS — I10 Essential (primary) hypertension: Secondary | ICD-10-CM

## 2020-10-29 DIAGNOSIS — E782 Mixed hyperlipidemia: Secondary | ICD-10-CM

## 2020-11-15 ENCOUNTER — Other Ambulatory Visit: Payer: Self-pay | Admitting: Student

## 2020-11-15 DIAGNOSIS — I2510 Atherosclerotic heart disease of native coronary artery without angina pectoris: Secondary | ICD-10-CM

## 2020-11-19 ENCOUNTER — Other Ambulatory Visit: Payer: Self-pay | Admitting: Cardiology

## 2020-11-29 ENCOUNTER — Other Ambulatory Visit: Payer: Self-pay | Admitting: Cardiology

## 2020-11-29 DIAGNOSIS — I10 Essential (primary) hypertension: Secondary | ICD-10-CM

## 2020-11-29 DIAGNOSIS — E782 Mixed hyperlipidemia: Secondary | ICD-10-CM

## 2020-12-30 ENCOUNTER — Other Ambulatory Visit: Payer: Self-pay | Admitting: Cardiology

## 2020-12-30 DIAGNOSIS — I10 Essential (primary) hypertension: Secondary | ICD-10-CM

## 2021-01-13 ENCOUNTER — Other Ambulatory Visit: Payer: Self-pay

## 2021-01-13 ENCOUNTER — Telehealth: Payer: Self-pay | Admitting: Family Medicine

## 2021-01-13 DIAGNOSIS — G40909 Epilepsy, unspecified, not intractable, without status epilepticus: Secondary | ICD-10-CM

## 2021-01-13 MED ORDER — PHENYTOIN SODIUM EXTENDED 300 MG PO CAPS: 300.0000 mg | ORAL_CAPSULE | Freq: Every day | ORAL | 1 refills | Status: DC

## 2021-01-13 NOTE — Telephone Encounter (Signed)
Pt called in asking for a refill on the Phenytoin pt uses [facility]

## 2021-01-26 ENCOUNTER — Other Ambulatory Visit: Payer: Self-pay | Admitting: Cardiology

## 2021-01-26 DIAGNOSIS — I10 Essential (primary) hypertension: Secondary | ICD-10-CM

## 2021-01-28 ENCOUNTER — Ambulatory Visit (INDEPENDENT_AMBULATORY_CARE_PROVIDER_SITE_OTHER): Payer: Medicare Other | Admitting: Family Medicine

## 2021-01-28 ENCOUNTER — Encounter: Payer: Self-pay | Admitting: Family Medicine

## 2021-01-28 ENCOUNTER — Other Ambulatory Visit: Payer: Self-pay

## 2021-01-28 VITALS — BP 154/78 | HR 71 | Temp 98.3°F | Wt 186.4 lb

## 2021-01-28 DIAGNOSIS — E785 Hyperlipidemia, unspecified: Secondary | ICD-10-CM

## 2021-01-28 DIAGNOSIS — R195 Other fecal abnormalities: Secondary | ICD-10-CM

## 2021-01-28 DIAGNOSIS — I10 Essential (primary) hypertension: Secondary | ICD-10-CM | POA: Diagnosis not present

## 2021-01-28 NOTE — Progress Notes (Signed)
Subjective:  Patient ID: Gilbert [name], male    DOB: 29-Mar-1951  Age: 70 y.o. MRN: [id number]  CC:  Chief Complaint  Patient presents with   Hyperlipidemia   Medication Refill   Hypertension    HPI Gilbert [name] presents for   Hypertension: With history of CAD, stenting in 2015, ischemic cardiomyopathy with recovering EF, PAD, status post iliac/common femoral stenting in 2015, history of left nephrectomy for renal cell carcinoma and non-Hodgkin's lymphoma.  Followed by Dr. Virgina Jock with cardiology.  Last visit noted from December 2021.  77-month follow-up planned.  Blood pressure 142/76, off amlodipine for a few days at that time.  Amlodipine 10 mg daily.  Previously on spironolactone but discontinued due to improved NYHA symptoms and gynecomastia previously.  Lasix as needed- taking 20mg  qd - feet not swelling, working well.  Unable to tolerate aspirin due to side effects. Still on amlodipine 10mg  BID, entresto 97/103mg  , and coreg12.5mg  BID.  No missed doses. Next cardiology visit in August.  Home readings: not recently.  BP Readings from Last 3 Encounters:  01/28/21 (!) 154/78  07/29/20 (!) 142/76  06/20/20 (!) 158/69   Lab Results  Component Value Date   CREATININE 1.25 (H) 07/11/2019   Hyperlipidemia: Treated with Lipitor 80 mg, Zetia.  PCSK9 inhibitors discussed with cardiology but not interested. Lab Results  Component Value Date   CHOL 173 01/22/2020   HDL 66 01/22/2020   LDLCALC 90 01/22/2020   TRIG 94 01/22/2020   CHOLHDL 2.6 01/22/2020   Lab Results  Component Value Date   ALT 16 07/11/2019   AST 23 07/11/2019   ALKPHOS 115 07/11/2019   BILITOT 0.4 07/11/2019     HM: Had covid vaccine and booster - not sure of timing of covid booster.  Positive Cologuard screening test. Referred to gastroenterology in October 2020.  Based on the notes it appears voicemails were left to call and schedule. Does not remember them calling. Agrees to repeat referral.    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   Hematuria 10/29/2015   Dysphagia, pharyngoesophageal phase    Early satiety    Esophageal stricture    Gastritis and gastroduodenitis    PAD (peripheral artery disease) ([place]) 05/28/2014   Cardiomyopathy, ischemic 04/03/2014   History of left heart catheterization ([facility]) 02/26/2014   Arterial bleed, intraoperative 02/26/2014   Seizures ([place]) 11/27/2013   Essential hypertension 11/27/2013   Hypomagnesemia 11/27/2013   Coronary artery disease involving native coronary artery of native heart without angina pectoris 11/26/2013   Renal cancer ([place]) 10/19/2013   Coronary artery calcification 09/25/2013   Renal cyst 08/30/2013   Loss of weight 08/03/2013   Past Medical History:  Diagnosis Date   Abnormal liver enzymes    Chronic alkaline phosphatase elevation since 2014   Acid reflux    CAD (coronary artery disease)    Cancer ([place]) 08/2013   right kidney cancer   Cancer ([place])    Coronary artery calcification seen on CAT scan    Coronary atherosclerosis of native coronary artery    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 Used Impella Circulatory assist device. EF= 20-25%   History of epilepsy    at least  10 years ago. 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 EF 45% 04/10/2014   Therapeutic drug monitoring    Past Surgical History:  Procedure Laterality Date   BALLOON DILATION N/A 01/21/2015   Procedure: BALLOON DILATION;  Surgeon: Gatha Mayer, MD;  Location: [facility];  Service: Endoscopy;  Laterality: N/A;   CARDIAC CATHETERIZATION N/A 06/10/2015   Procedure: Left Heart Cath and Coronary Angiography;  Surgeon: Adrian Prows, MD;  Location: [facility];  Service: Cardiovascular;  Laterality: N/A;   CORONARY ANGIOPLASTY WITH STENT PLACEMENT  2015   Left main coronary artery stenting on emergent basis along with circulatory support with Impella device through left leg. With 4.0 x 18 mm Xience alpine stent   EAR CYST EXCISION Left 05/31/2018   Procedure: EXCISION LEFT POSTERIOR EAR TUMOR;  Surgeon: Leta Baptist, MD;  Location: [facility];  Service: ENT;  Laterality: Left;   ESOPHAGOGASTRODUODENOSCOPY     ESOPHAGOGASTRODUODENOSCOPY N/A 01/21/2015   Procedure: ESOPHAGOGASTRODUODENOSCOPY (EGD) with possible dilation.;  Surgeon: Gatha Mayer, MD;  Location: [facility];  Service: Endoscopy;  Laterality: N/A;   EYE SURGERY Left 2 weeks ago   torn retina   ILIAC ARTERY STENT Left 05/28/2014   dr Einar Gip   LEFT HEART CATHETERIZATION WITH CORONARY ANGIOGRAM N/A 11/27/2013   Procedure: LEFT HEART CATHETERIZATION WITH CORONARY ANGIOGRAM;  Surgeon: [name], MD;  Location: [facility];  Service: Cardiovascular;  Laterality: N/A;   LOWER EXTREMITY ANGIOGRAM N/A 02/26/2014   Procedure: LOWER  EXTREMITY ANGIOGRAM;  Surgeon: [name], MD;  Location: [facility];  Service: Cardiovascular;  Laterality: N/A;   LOWER EXTREMITY ANGIOGRAM N/A 05/28/2014   Procedure: LOWER EXTREMITY ANGIOGRAM;  Surgeon: [name], MD;  Location: [facility] CATH LAB;  Service: Cardiovascular;  Laterality: N/A;   NEPHRECTOMY  11/2013   NO PAST SURGERIES     PERCUTANEOUS CORONARY STENT INTERVENTION (PCI-S)  11/27/2013   Procedure: PERCUTANEOUS CORONARY STENT INTERVENTION (PCI-S);  Surgeon: [name], MD;  Location: [facility] C[facility];  Service: Cardiovascular;;   ROBOT ASSISTED LAPAROSCOPIC NEPHRECTOMY Right 10/19/2013   Procedure: ROBOTIC ASSISTED LAPAROSCOPIC NEPHRECTOMY,  EXTENSIVE ADHESIOLYSIS;  Surgeon: Alexis Frock, MD;  Location: [facility];  Service: Urology;  Laterality: Right;   SKIN FULL THICKNESS GRAFT Left 05/31/2018   Procedure: SKIN GRAFT FULL THICKNESS FROM LEFT ABDOMEN TO LEFT EAR;  Surgeon: Leta Baptist, MD;  Location: [facility];  Service: ENT;  Laterality: Left;   Allergies  Allergen Reactions   Oxycodone Swelling and Other (See Comments)  Tongue and lips swell   Prior to Admission medications   Medication Sig Start Date End Date Taking? Authorizing Provider  acetaminophen (TYLENOL) 500 MG tablet Take 500 mg by mouth every 6 (six) hours as needed.   Yes [provider]  amLODipine (NORVASC) 10 MG tablet Take 1 tablet by mouth once daily 01/26/21  Yes Adrian Prows, MD  atorvastatin (LIPITOR) 80 MG tablet Take 1 tablet by mouth once daily 11/19/20  Yes Patwardhan, Manish J, MD  carvedilol (COREG) 12.5 MG tablet Take 1 tablet (12.5 mg total) by mouth 2 (two) times daily with a meal. 07/29/20  Yes Cantwell, Celeste C, PA-C  clopidogrel (PLAVIX) 75 MG tablet Take 1 tablet by mouth once daily 11/17/20  Yes Cantwell, Celeste C, PA-C  Cyanocobalamin (B-12) 5000 MCG CAPS Take 5,000 mcg by mouth every morning.    Yes [provider]  ENTRESTO 97-103 MG Take 1 tablet by mouth twice daily  01/26/21  Yes Adrian Prows, MD  ezetimibe (ZETIA) 10 MG tablet Take 1 tablet by mouth once daily 12/01/20  Yes Patwardhan, Manish J, MD  famotidine (PEPCID) 20 MG tablet Take 20 mg by mouth 2 (two) times daily.   Yes [provider]  furosemide (LASIX) 40 MG tablet TAKE 1/2 (ONE-HALF) TABLET BY MOUTH ONCE DAILY AS NEEDED 07/30/20  Yes Patwardhan, Manish J, MD  phenytoin (DILANTIN) 300 MG ER capsule Take 1 capsule (300 mg total) by mouth daily. 01/13/21  Yes Wendie Agreste, MD  pyridOXINE (VITAMIN B-6) 100 MG tablet Take 100 mg by mouth every morning.    Yes [provider]  Thiamine HCl (VITAMIN B-1) 250 MG tablet Take 250 mg by mouth every morning.    Yes [provider]   Social History   Socioeconomic History   Marital status: Divorced    Spouse name: Not on file   Number of children: 2   Years of education: Not on file   Highest education level: Not on file  Occupational History   Not on file  Tobacco Use   Smoking status: Former    Packs/day: 2.00    Years: 45.00    Pack years: 90.00    Types: Cigarettes    Quit date: 08/03/2003    Years since quitting: 17.5   Smokeless tobacco: Current    Types: Snuff   Tobacco comments:    quit  smoking 10 years agop  Vaping Use   Vaping Use: Never used  Substance and Sexual Activity   Alcohol use: No    Alcohol/week: 0.0 standard drinks    Comment: quit 1980   Drug use: No    Comment: none in past 30 years    Sexual activity: Not on file  Other Topics Concern   Not on file  Social History Narrative   ** Merged History Encounter **       Social Determinants of Health   Financial Resource Strain: Not on file  Food Insecurity: Not on file  Transportation Needs: Not on file  Physical Activity: Not on file  Stress: Not on file  Social Connections: Not on file  Intimate Partner Violence: Not on file    Review of Systems  Constitutional:  Negative for fatigue.  Respiratory:  Negative for cough, chest  tightness and shortness of breath.   Cardiovascular:  Negative for chest pain, palpitations and leg swelling.  Gastrointestinal:  Negative for abdominal pain and blood in stool.  Neurological:  Negative for dizziness, light-headedness and headaches.  Objective:   Vitals:   01/28/21 1456 01/28/21 1538  BP: (!) 154/80 (!) 154/78  Pulse: 71   Temp: 98.3 F (36.8 C)   TempSrc: Temporal   SpO2: 96%   Weight: 186 lb 6.4 oz (84.6 kg)      Physical Exam Vitals reviewed.  Constitutional:      Appearance: He is well-developed.  HENT:     Head: Normocephalic and atraumatic.  Neck:     Vascular: No carotid bruit or JVD.  Cardiovascular:     Rate and Rhythm: Normal rate and regular rhythm.     Heart sounds: Normal heart sounds. No murmur heard. Pulmonary:     Effort: Pulmonary effort is normal.     Breath sounds: Normal breath sounds. No rales.  Musculoskeletal:     Right lower leg: No edema.     Left lower leg: No edema.  Skin:    General: Skin is warm and dry.  Neurological:     Mental Status: He is alert and oriented to person, place, and time.  Psychiatric:        Mood and Affect: Mood normal.       Assessment & Plan:  Gilbert [name] is a 70 y.o. male . Essential hypertension - Plan: Comprehensive metabolic panel  - decreased control. Consider increase in coreg, but will d/w cardiology. Denies recent missed med doses.   Hyperlipidemia, unspecified hyperlipidemia type - Plan: Comprehensive metabolic panel, Lipid panel  - check labs. Tolerating current regimen   Positive colorectal cancer screening using Cologuard test - Plan: Ambulatory referral to Gastroenterology  - repeat referral to GI.   No orders of the defined types were placed in this encounter.  Patient Instructions  Keep a record of your blood pressures outside of the office and bring them to the next office visit.  Blood pressure is running too high today.  We potentially could increase carvedilol, but  would like to discuss that change first with your cardiologist.   If it has been at least 4 months since your last COVID 19 vaccine booster, you can get a second booster.  Talk to your pharmacist.  I will refer you to gastroenterology again for the positive colon cancer screening test in 2020. Let me know if you have not heard from them in the next 3 weeks.   Thanks for coming in today.  Please let me know if there are questions.  Take care.    Signed,   [name], MD [place] 01/28/21 10:03 PM

## 2021-01-28 NOTE — Patient Instructions (Addendum)
Keep a record of your blood pressures outside of the office and bring them to the next office visit.  Blood pressure is running too high today.  We potentially could increase carvedilol, but would like to discuss that change first with your cardiologist.   If it has been at least 4 months since your last COVID 19 vaccine booster, you can get a second booster.  Talk to your pharmacist.  I will refer you to gastroenterology again for the positive colon cancer screening test in 2020. Let me know if you have not heard from them in the next 3 weeks.   Thanks for coming in today.  Please let me know if there are questions.  Take care.

## 2021-01-29 LAB — LIPID PANEL
Cholesterol: 168 mg/dL (ref 0–200)
HDL: 64.4 mg/dL (ref >39.00)
LDL Cholesterol: 86 mg/dL (ref 0–99)
NonHDL: 103.45
Total CHOL/HDL Ratio: 3
Triglycerides: 86 mg/dL (ref 0.0–149.0)
VLDL: 17.2 mg/dL (ref 0.0–40.0)

## 2021-01-29 LAB — COMPREHENSIVE METABOLIC PANEL
ALT: 13 U/L (ref 0–53)
AST: 22 U/L (ref 0–37)
Albumin: 3.8 g/dL (ref 3.5–5.2)
Alkaline Phosphatase: 156 U/L — ABNORMAL HIGH (ref 39–117)
BUN: 14 mg/dL (ref 6–23)
CO2: 31 mEq/L (ref 19–32)
Calcium: 9 mg/dL (ref 8.4–10.5)
Chloride: 103 mEq/L (ref 96–112)
Creatinine, Ser: 1.41 mg/dL (ref 0.40–1.50)
GFR: 50.57 mL/min — ABNORMAL LOW (ref >60.00)
Glucose, Bld: 83 mg/dL (ref 70–99)
Potassium: 3.3 mEq/L — ABNORMAL LOW (ref 3.5–5.1)
Sodium: 142 mEq/L (ref 135–145)
Total Bilirubin: 0.5 mg/dL (ref 0.2–1.2)
Total Protein: 6.4 g/dL (ref 6.0–8.3)

## 2021-02-03 ENCOUNTER — Other Ambulatory Visit: Payer: Self-pay | Admitting: Family Medicine

## 2021-02-03 DIAGNOSIS — E876 Hypokalemia: Secondary | ICD-10-CM

## 2021-02-03 MED ORDER — POTASSIUM CHLORIDE CRYS ER 10 MEQ PO TBCR: 10.0000 meq | EXTENDED_RELEASE_TABLET | Freq: Every day | ORAL | 3 refills | Status: DC

## 2021-02-03 NOTE — Progress Notes (Signed)
See labs 

## 2021-02-21 ENCOUNTER — Other Ambulatory Visit: Payer: Self-pay | Admitting: Cardiology

## 2021-02-27 ENCOUNTER — Other Ambulatory Visit: Payer: Self-pay | Admitting: Cardiology

## 2021-02-27 DIAGNOSIS — I10 Essential (primary) hypertension: Secondary | ICD-10-CM

## 2021-03-02 ENCOUNTER — Other Ambulatory Visit: Payer: Self-pay | Admitting: Cardiology

## 2021-03-02 DIAGNOSIS — E782 Mixed hyperlipidemia: Secondary | ICD-10-CM

## 2021-03-04 ENCOUNTER — Ambulatory Visit: Payer: Medicare Other | Admitting: Nurse Practitioner

## 2021-03-04 ENCOUNTER — Encounter: Payer: Self-pay | Admitting: Nurse Practitioner

## 2021-03-04 VITALS — BP 130/70 | HR 54 | Ht 70.0 in | Wt 183.0 lb

## 2021-03-04 DIAGNOSIS — R195 Other fecal abnormalities: Secondary | ICD-10-CM

## 2021-03-04 NOTE — Progress Notes (Addendum)
ASSESSMENT AND PLAN    #  70 yo male with a positive Cologuard from Oct 2020. He was previously referred but we were unable to reach him to schedule an appointment.  No overt GI bleeding, bowel changes  unexplained weight loss or  family history of colon cancer. Ideally patient would undergo a colonoscopy but he is at increased risk for procedures due to multiple medical conditions such as chronic systolic heart failure. Most recent EF in 2019 was improved to ~ 15% to 55%.  --Will talk with Dr. Carlean Purl to get his thoughts on proceeding with colonoscopy. Patient made it clear that he would not want to undergo surgery / chemoradiation should he have colon cancer. I explained that the positive Cologuard doesn't necessarily translate into colon cancer. He could resectable polyps or maybe nothing at all with a false positive Cologuard.      # CAD s/p MI / stenting in 2015 on chronic Plavix. He has ischemic cardiomyopathy but EF of 15-20% in 2018 improved to 55% on TEE in 2019. CHF treated with Coreg, Entresto and diuretics. No significant edema. No chest pain or SOB.   # Chronic antiplatelet therapy. If we proceed with colonoscopy patient will need to hold Plavix for 5 days before procedure - will instruct when and how to resume after procedure. Patient understands that there is a low but real risk of cardiovascular event such as heart attack, stroke, or embolism /  thrombosis, or ischemia while off Plavix. The patient consents to proceed. Will communicate by phone or EMR with patient's prescribing provider to confirm that holding Plavix is reasonable in this case.   # GERD. Gets heartburn almost daily despite BID Famotidine. He used to take what sounds like Prillosec / Nexium but was taken off ( ? Due to kidney disease).  --Anti-reflux measures discussed including avoidance of trigger foods and evening meals / bedtime snacks. If able, elevate head of bed 6-8 inches. If unable to elevate the head of  the bed consider a wedge pillow.   Weight reduction / maintaining a healthy BMI) as increased abdominal girth is associated with reflux. He drinks a lot of tea.   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 Received chemotherapy in 2020  # Chronic, mild elevation in phos elevation ( 156).   # Seizures, none in years. Takes Dilantin   Gastroenterology attending update  Cardiology has seen the patient there is a note in the chart clearing him for colonoscopy pending an acceptable EF which she has on echocardiogram they ordered.  They agree with withholding clopidogrel prior to colonoscopy as per our protocols.  I do not think there is any contraindication at this point for him to have his colonoscopy in our ambulatory surgery center.  We will have anesthesia provider double check as well.  If there are unanticipated concerns we can schedule at the hospital.  We will contact the patient and explain and schedule his procedure.  Gatha Mayer, MD, [facility], [place]   HISTORY OF PRESENT ILLNESS     Chief Complaint : positive Cologuard.  Gilbert [name] is a 70 y.o. male with multiple medical problems not limited to cardiac arrest April 2015, cardiac stenting, PAD s/p left femoral stenting in [id number], chronic systolic and diastolic congestive heart failure, CKD, seizure disorder, and stage III right renal cell cancer status post  nephrectomy March 2015, chronic nausea/ vomiting  Patient is known to Dr. Carlean Purl from 2016. He is referred to colonoscopy. Had a positive Cologuard in Oct 2020, referred at that time but we were apparently unable to reach him to schedule and appointment Patient says he has never had a colonoscopy. No blood in stool. Bowel movements are at baseline. Weight is stable. No [place] of colon cancer.    Data Reviewed:  06/02/2017 Echo LV EF 15-20%  Lexiscan thalium stress test  06/09/2017: 1. Prognostically, this is a high risk study. Resting EKG demonstrates ectopic atrial rhythm and sinus rhythm with LVH with repolarization, cannot exclude lateral ischemia. Stress EKG is nondiagnostic for ischemia as is the pharmacologic stress test. Stress symptoms included dyspnea and dizziness. 2. There is a mild area of ischemia of small size in the septal wall.  There is very large scar of the inferolateral segment with very mild residual ischemia. 3. Compared to the prior study dated 05/09/2015, the current study now reveals Inferior scar with mild lateral ischemia new. EF has decreaed from 30% to 17%  March 2019 TEE --EF improved to 50-55%.   PREVIOUS GI EVALUATIONS:   2016 EGD for early satiety and dysphagia ENDOSCOPIC IMPRESSION: 1) Ring-like stricture at GE junction - dilated with 18 mm balloon, slight heme good result 2) antral erythema and mottling - gastritis - biopsied 3) Otherwise NL EGD  Past Medical History:  Diagnosis Date   Abnormal liver enzymes    Chronic alkaline phosphatase elevation since 2014   Acid reflux    CAD (coronary artery disease)    Cancer ([place]) 08/2013   right kidney cancer   Cancer ([place])    Coronary artery calcification seen on CAT scan    Coronary atherosclerosis of native coronary artery    Diffuse large cell non-Hodgkin's lymphoma ([place]) 01/07/2016   Diffuse non-Hodgkin's lymphoma of bone ([place]) 01/08/2016   Essential hypertension, benign    Family history of anesthesia complication    mother-had stroke during anesthesia   GERD (gastroesophageal reflux disease)    Gout    History of cardiac arrest 11/26/13   PTCA/Stenting of LM & Prox LAD with 4.0 x 18 mm Xience alpine stent. Used Impella Circulatory assist device. EF= 20-25%   History of epilepsy    at least 10 years ago. Grandmal seizures since childhood   History of myocardial infarction less than 8 weeks    Stent placed   History of nephrectomy 11/2013   For renal cell carcinoma    History of tobacco use    HTN (hypertension)    Hypercholesteremia    Hyperlipemia    Hypertension    PAD (peripheral artery disease) (HCC)    Seizures (HCC)    Shortness of breath    with activity   SOB (shortness of breath)    Systolic and diastolic CHF, chronic (HCC)    Resolved. EF 45% 04/10/2014   Therapeutic drug monitoring      Past Surgical History:  Procedure Laterality Date   BALLOON DILATION N/A 01/21/2015   Procedure: BALLOON DILATION;  Surgeon: Gatha Mayer, MD;  Location: [facility];  Service: Endoscopy;  Laterality: N/A;   CARDIAC CATHETERIZATION N/A 06/10/2015   Procedure: Left Heart Cath and Coronary Angiography;  Surgeon: Adrian Prows, MD;  Location: [facility];  Service: Cardiovascular;  Laterality: N/A;   CORONARY ANGIOPLASTY WITH STENT PLACEMENT  08/02/2013   Left main coronary artery stenting on emergent basis along with circulatory support with Impella  device through left leg. With 4.0 x 18 mm Xience alpine stent   EAR CYST EXCISION Left 05/31/2018   Procedure: EXCISION LEFT POSTERIOR EAR TUMOR;  Surgeon: Leta Baptist, MD;  Location: [facility];  Service: ENT;  Laterality: Left;   ESOPHAGOGASTRODUODENOSCOPY     ESOPHAGOGASTRODUODENOSCOPY N/A 01/21/2015   Procedure: ESOPHAGOGASTRODUODENOSCOPY (EGD) with possible dilation.;  Surgeon: Gatha Mayer, MD;  Location: [facility];  Service: Endoscopy;  Laterality: N/A;   EYE SURGERY Left 2 weeks ago   torn retina   ILIAC ARTERY STENT Left 05/28/2014   dr Einar Gip   LEFT HEART CATHETERIZATION WITH CORONARY ANGIOGRAM N/A 11/27/2013   Procedure: LEFT HEART CATHETERIZATION WITH CORONARY ANGIOGRAM;  Surgeon: [name], MD;  Location: [facility] CATH LAB;  Service: Cardiovascular;  Laterality: N/A;   LOWER EXTREMITY ANGIOGRAM N/A 02/26/2014   Procedure: LOWER EXTREMITY ANGIOGRAM;  Surgeon: [name], MD;  Location: [facility] CATH LAB;  Service: Cardiovascular;  Laterality: N/A;   LOWER EXTREMITY ANGIOGRAM N/A  05/28/2014   Procedure: LOWER EXTREMITY ANGIOGRAM;  Surgeon: [name], MD;  [facility];  Service: Cardiovascular;  Laterality: N/A;   NEPHRECTOMY  11/30/2013   PERCUTANEOUS CORONARY STENT INTERVENTION (PCI-S)  11/27/2013   Procedure: PERCUTANEOUS CORONARY STENT INTERVENTION (PCI-S);  Surgeon: [name], MD;  Location: [facility] CATH LAB;  Service: Cardiovascular;;   ROBOT ASSISTED LAPAROSCOPIC NEPHRECTOMY Right 10/19/2013   Procedure: ROBOTIC ASSISTED LAPAROSCOPIC NEPHRECTOMY,  EXTENSIVE ADHESIOLYSIS;  Surgeon: Alexis Frock, MD;  Location: [facility];  Service: Urology;  Laterality: Right;   SKIN FULL THICKNESS GRAFT Left 05/31/2018   Procedure: SKIN GRAFT FULL THICKNESS FROM LEFT ABDOMEN TO LEFT EAR;  Surgeon: Leta Baptist, MD;  Location: [facility] OR;  Service: ENT;  Laterality: Left;   Family History  Problem Relation Age of Onset   Stroke Mother    Heart disease Brother        multiple stents   Hyperlipidemia Brother    Emphysema Father    Social History   Tobacco Use   Smoking status: Former    Packs/day: 2.00    Years: 45.00    Pack years: 90.00    Types: Cigarettes    Quit date: 08/03/2003    Years since quitting: 17.5   Smokeless tobacco: Current    Types: Snuff   Tobacco comments:    quit  smoking 10 years agop  Vaping Use   Vaping Use: Never used  Substance Use Topics   Alcohol use: No    Alcohol/week: 0.0 standard drinks    Comment: quit 1980   Drug use: No    Comment: none in past 30 years    Current Outpatient Medications  Medication Sig Dispense Refill   acetaminophen (TYLENOL) 500 MG tablet Take 500 mg by mouth every 6 (six) hours as needed.     amLODipine (NORVASC) 10 MG tablet Take 1 tablet by mouth once daily 90 tablet 0   atorvastatin (LIPITOR) 80 MG tablet Take 1 tablet by mouth once daily 90 tablet 0   carvedilol (COREG) 12.5 MG tablet Take 1 tablet (12.5 mg total) by mouth 2 (two) times daily with a meal. 180 tablet 3   clopidogrel (PLAVIX)  75 MG tablet Take 1 tablet by mouth once daily 90 tablet 0   Cyanocobalamin (B-12) 5000 MCG CAPS Take 5,000 mcg by mouth every morning.      ENTRESTO 97-103 MG Take 1 tablet by mouth twice daily 180 tablet 1   ezetimibe (ZETIA) 10  MG tablet Take 1 tablet by mouth once daily 90 tablet 0   famotidine (PEPCID) 20 MG tablet Take 20 mg by mouth 2 (two) times daily.     furosemide (LASIX) 40 MG tablet TAKE 1/2 (ONE-HALF) TABLET BY MOUTH ONCE DAILY AS NEEDED 45 tablet 6   phenytoin (DILANTIN) 300 MG ER capsule Take 1 capsule (300 mg total) by mouth daily. 90 capsule 1   potassium chloride (KLOR-CON) 10 MEQ tablet Take 1 tablet (10 mEq total) by mouth daily. 30 tablet 3   pyridOXINE (VITAMIN B-6) 100 MG tablet Take 100 mg by mouth every morning.      Thiamine HCl (VITAMIN B-1) 250 MG tablet Take 250 mg by mouth every morning.      No current facility-administered medications for this visit.   Facility-Administered Medications Ordered in Other Visits  Medication Dose Route Frequency Provider Last Rate Last Admin   sodium chloride flush (NS) 0.9 % injection 10 mL  10 mL Intravenous PRN Volanda Napoleon, MD   10 mL at 05/05/16 1127   Allergies  Allergen Reactions   Oxycodone Swelling and Other (See Comments)    Tongue and lips swell     Review of Systems: All other systems reviewed and negative except where noted in HPI.    PHYSICAL EXAM :    Wt Readings from Last 3 Encounters:  03/04/21 183 lb (83 kg)  01/28/21 186 lb 6.4 oz (84.6 kg)  07/29/20 184 lb (83.5 kg)    BP 130/70   Pulse (!) 54   Ht '5\' 10"'$  (1.778 m)   Wt 183 lb (83 kg)   BMI 26.26 kg/m  Constitutional:  Pleasant male in no acute distress. Psychiatric: Normal mood and affect. Behavior is normal. EENT: Pupils normal.  Conjunctivae are normal. No scleral icterus. Neck supple.  Cardiovascular: Normal rate, regular rhythm. No edema Pulmonary/chest: Effort normal and breath sounds normal. No wheezing, rales or  rhonchi. Abdominal: Soft, nondistended, nontender. Bowel sounds active throughout. There are no masses palpable. No hepatomegaly. Neurological: Alert and oriented to person place and time. Skin: Skin is warm and dry. No rashes noted.  Tye Savoy, NP  03/04/2021, 10:52 AM  Cc:  Referring Provider Wendie Agreste, MD

## 2021-03-04 NOTE — Patient Instructions (Signed)
Office will be in touch with you regarding further work-up after speaking with Dr.Gessner about the risk of having a colonoscopy due to your past medical history.   If you are age 70 or older, your body mass index should be between 23-30. Your Body mass index is 26.26 kg/m. If this is out of the aforementioned range listed, please consider follow up with your Primary Care Provider.  If you are age 66 or younger, your body mass index should be between 19-25. Your Body mass index is 26.26 kg/m. If this is out of the aformentioned range listed, please consider follow up with your Primary Care Provider.   __________________________________________________________  The [facility] providers would like to encourage you to use [facility] to communicate with providers for non-urgent requests or questions.  Due to long hold times on the telephone, sending your provider a message by [facility] be a faster and more efficient way to get a response.  Please allow 48 business hours for a response.  Please remember that this is for non-urgent requests.   Thank you for choosing me and [facility].  Tye Savoy NP

## 2021-03-05 ENCOUNTER — Encounter: Payer: Self-pay | Admitting: Cardiology

## 2021-03-05 ENCOUNTER — Other Ambulatory Visit: Payer: Self-pay

## 2021-03-05 ENCOUNTER — Ambulatory Visit: Payer: Medicare Other | Admitting: Cardiology

## 2021-03-05 ENCOUNTER — Encounter: Payer: Self-pay | Admitting: Nurse Practitioner

## 2021-03-05 VITALS — Temp 98.0°F | Resp 16 | Ht 70.0 in | Wt 185.0 lb

## 2021-03-05 DIAGNOSIS — I2510 Atherosclerotic heart disease of native coronary artery without angina pectoris: Secondary | ICD-10-CM

## 2021-03-05 DIAGNOSIS — I10 Essential (primary) hypertension: Secondary | ICD-10-CM

## 2021-03-05 DIAGNOSIS — I739 Peripheral vascular disease, unspecified: Secondary | ICD-10-CM

## 2021-03-05 MED ORDER — AMLODIPINE BESYLATE 10 MG PO TABS: 5.0000 mg | ORAL_TABLET | Freq: Every day | ORAL | 0 refills | Status: DC

## 2021-03-05 NOTE — Progress Notes (Signed)
Follow up visit  Subjective:   Gilbert [name], male    DOB: 1951/01/01, 70 y.o.   MRN: [id number]  HPI   Chief Complaint  Patient presents with   Coronary artery disease involving native coronary artery of   Hyperlipidemia   Hypertension   Medical Clearance    colonoscopy   Dizziness    70 y.o. Caucasian male  with CAD s/p MI, LM stenting 2015, ischemic cardiomyopathy with recovery in EF to 55% on guideline directed medical therapy, PAD s/p Left external iliac/common femoral stenting 2015, h/o left nephrectomy for renal cell carcinoma, h/o NHL, currently in remission.  Patient denies chest pain, shortness of breath, palpitations, leg edema, orthopnea, PND, TIA. He has been having episodes of lightheadedness on standing up, but denies any syncope.   Evidently, he has not been taking plavix for a while. That said, he recently had a positive FOBT and is recommended to undergo colonoscopy.    Current Outpatient Medications on File Prior to Visit  Medication Sig Dispense Refill   acetaminophen (TYLENOL) 500 MG tablet Take 500 mg by mouth every 6 (six) hours as needed.     amLODipine (NORVASC) 10 MG tablet Take 1 tablet by mouth once daily 90 tablet 0   atorvastatin (LIPITOR) 80 MG tablet Take 1 tablet by mouth once daily 90 tablet 0   carvedilol (COREG) 12.5 MG tablet Take 1 tablet (12.5 mg total) by mouth 2 (two) times daily with a meal. 180 tablet 3   Cyanocobalamin (B-12) 5000 MCG CAPS Take 5,000 mcg by mouth every morning.      ENTRESTO 97-103 MG Take 1 tablet by mouth twice daily 180 tablet 1   ezetimibe (ZETIA) 10 MG tablet Take 1 tablet by mouth once daily 90 tablet 0   famotidine (PEPCID) 20 MG tablet Take 20 mg by mouth 2 (two) times daily.     furosemide (LASIX) 40 MG tablet TAKE 1/2 (ONE-HALF) TABLET BY MOUTH ONCE DAILY AS NEEDED 45 tablet 6   phenytoin (DILANTIN) 300 MG ER capsule Take 1 capsule (300 mg total) by mouth daily. 90 capsule 1   potassium chloride (KLOR-CON) 10  MEQ tablet Take 1 tablet (10 mEq total) by mouth daily. 30 tablet 3   pyridOXINE (VITAMIN B-6) 100 MG tablet Take 100 mg by mouth every morning.      Thiamine HCl (VITAMIN B-1) 250 MG tablet Take 250 mg by mouth every morning.      clopidogrel (PLAVIX) 75 MG tablet Take 1 tablet by mouth once daily (Patient not taking: Reported on 03/05/2021) 90 tablet 0   Current Facility-Administered Medications on File Prior to Visit  Medication Dose Route Frequency Provider Last Rate Last Admin   sodium chloride flush (NS) 0.9 % injection 10 mL  10 mL Intravenous PRN Volanda Napoleon, MD   10 mL at 05/05/16 1127    Cardiovascular & other pertient studies:  EKG 03/05/2021: Sinus rhythm 56 bpm Old inferior infarct Inferolateral ST depression, nonspecific  Echocardiogram 10/11/2017: Left ventricle cavity is normal in size. Moderate concentric hypertrophy of the left ventricle. Low normal LV systolic function. Visual EF is 50-55%. Doppler evidence of grade I (impaired) diastolic dysfunction, indeterminate LAP. Left ventricle regional wall motion findings: No wall motion abnormalities. Calculated EF 46%. Mild tricuspid regurgitation. Borderline pulmonary hypertension, PA pressure estimated at 31 mm Hg. Compared to the study done on 03/30/2017, LV EF is improved from 35% to the present 50-55%. Compared to hospital echo 06/02/17, EF improved from  20%. RV function was previously mildly reduced and small pericardial effusion and left pleural effusion not present.  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 Right common carotid artery and right external carotid artery stenosis of (<50%). Stenosis in the left internal carotid artery (16-49%).  Antegrade right vertebral artery flow. Antegrade left vertebral artery flow. Compared to the study done on 01/27/2018,  right ICA stenosis is new.  Left ICA stenosis was >50%. F/U in 6 months.  Lexiscan thalium stress test 06/09/2017: 1.  Prognostically, this is a high risk study. Resting EKG demonstrates ectopic atrial rhythm and sinus rhythm with LVH with repolarization, cannot exclude lateral ischemia. Stress EKG is nondiagnostic for ischemia as is the pharmacologic stress test. Stress symptoms included dyspnea and dizziness. 2. There is a mild area of ischemia of small size in the septal wall.  There is very large scar of the inferolateral segment with very mild residual ischemia. 3. Compared to the prior study dated 05/09/2015, the current study now reveals Inferior scar with mild lateral ischemia new. EF has decreaed from 30% to 17%.  Recent labs: 01/28/2021: Glucose 83, BUN/Cr 14/1.41. EGFR 50. Na/K 142/3.3. AlKP 156. Rest of the CMP normal Chol 168, TG 86, HDL 64, LDL 86  01/22/2020: Chol 173, TG 94, HDL 66, LDL 90  07/11/2019: Glucose 86, BUN/Cr 13/1.25. EGFR 59. Na/K 140/3.4. Rest of the CMP normal H/H 12.7/36.1. MCV 87. Platelets 180 Chol 194, TG 136, HDL 59, LDL 111   Review of Systems  Cardiovascular:  Negative for chest pain, dyspnea on exertion, leg swelling, palpitations and syncope.  Neurological:  Positive for dizziness and light-headedness.       Vitals:   03/05/21 1048 03/05/21 1051  Resp:    Temp:    SpO2: 95% 96%   Orthostatic VS for the past 72 hrs (Last 3 readings):  Orthostatic BP Patient Position BP Location Cuff Size Orthostatic Pulse  03/05/21 1051 112/66 Supine Left Arm Normal 62  03/05/21 1048 102/52 Sitting Left Arm Normal 62  03/05/21 1046 142/75 Supine Left Arm Normal 55      Body mass index is 26.54 kg/m. Filed Weights   03/05/21 1046  Weight: 185 lb (83.9 kg)    Objective:   Physical Exam Vitals and nursing note reviewed.  Constitutional:      General: He is not in acute distress. Neck:     Vascular: No JVD.  Cardiovascular:     Rate and Rhythm: Normal rate and regular rhythm.     Pulses:          Dorsalis pedis pulses are 0 on the right side and 0 on the left  side.       Posterior tibial pulses are 1+ on the right side and 1+ on the left side.     Heart sounds: Normal heart sounds. No murmur heard. Pulmonary:     Effort: Pulmonary effort is normal.     Breath sounds: Normal breath sounds. No wheezing or rales.  Musculoskeletal:     Right lower leg: No edema.     Left lower leg: No edema.          Assessment & Recommendations:   70 y/o Caucasian male with CAD s/p MI, LM stenting 2015, ischemic cardiomyopathy with recovery in EF to 55% on guideline directed medical therapy, PAD s/p Left external iliac/common femoral stenting 2015, h/o left nephrectomy for renal cell carcinoma, h/o NHL, currently in remission.   Hypertension: Controlled, but  with mild orthostatic hypotension. Reduce amlodipine to 5 mg daily.   Ischemic cardiomyopathy: EF recoverred to 50-55% on guiideline directed medical thrapy. Continue coreg 12.5 mg bid, Entresto 97-103 mg bid. Not on spironolactone due to improved NYHA symptoms and gynacomastia from spironolactone. Lasix as needed I will obtain echocardiogram prior to colonoscopy. Ef EF preserved, cardiac risk from colonoscopy is low.  CAD: Stable. No angina symptoms. Continue lipitor 80 mg. Zetia. Not interested in PCSK9 inhibitor.  Not taking plavix. Recommend holding until after colonoscopy.  PAD: Stable.  F/u in 3 months  [place], MD [facility] Pager: [phone] Office: [phone]

## 2021-03-11 ENCOUNTER — Other Ambulatory Visit: Payer: Self-pay

## 2021-03-11 ENCOUNTER — Ambulatory Visit: Payer: Medicare Other

## 2021-03-11 DIAGNOSIS — I2510 Atherosclerotic heart disease of native coronary artery without angina pectoris: Secondary | ICD-10-CM

## 2021-03-12 ENCOUNTER — Other Ambulatory Visit: Payer: Self-pay

## 2021-03-12 ENCOUNTER — Telehealth: Payer: Self-pay

## 2021-03-12 ENCOUNTER — Other Ambulatory Visit: Payer: Medicare Other

## 2021-03-12 DIAGNOSIS — R195 Other fecal abnormalities: Secondary | ICD-10-CM

## 2021-03-12 DIAGNOSIS — R634 Abnormal weight loss: Secondary | ICD-10-CM

## 2021-03-12 NOTE — Telephone Encounter (Signed)
-----   Message from Willia Craze, NP sent at 03/12/2021  8:37 AM EDT ----- Eustaquio Maize, please turn this into a phone note.  Patient needs a colonoscopy but he is high cardiac risk.  We are waiting on an echocardiogram.  In the meantime will you please have him get a CBC, ferritin and a B12 level.  Thanks

## 2021-03-12 NOTE — Telephone Encounter (Signed)
Patient notified. He said he will come for labs on 03/16/21.

## 2021-03-16 ENCOUNTER — Other Ambulatory Visit (INDEPENDENT_AMBULATORY_CARE_PROVIDER_SITE_OTHER): Payer: Medicare Other

## 2021-03-16 DIAGNOSIS — R195 Other fecal abnormalities: Secondary | ICD-10-CM

## 2021-03-16 DIAGNOSIS — R634 Abnormal weight loss: Secondary | ICD-10-CM | POA: Diagnosis not present

## 2021-03-16 LAB — CBC WITH DIFFERENTIAL/PLATELET
Basophils Absolute: 0.1 10*3/uL (ref 0.0–0.1)
Basophils Relative: 0.8 % (ref 0.0–3.0)
Eosinophils Absolute: 0.5 10*3/uL (ref 0.0–0.7)
Eosinophils Relative: 6.9 % — ABNORMAL HIGH (ref 0.0–5.0)
HCT: 36.9 % — ABNORMAL LOW (ref 39.0–52.0)
Hemoglobin: 12.9 g/dL — ABNORMAL LOW (ref 13.0–17.0)
Lymphocytes Relative: 18.9 % (ref 12.0–46.0)
Lymphs Abs: 1.5 10*3/uL (ref 0.7–4.0)
MCHC: 34.8 g/dL (ref 30.0–36.0)
MCV: 87.7 fl (ref 78.0–100.0)
Monocytes Absolute: 0.4 10*3/uL (ref 0.1–1.0)
Monocytes Relative: 5.3 % (ref 3.0–12.0)
Neutro Abs: 5.4 10*3/uL (ref 1.4–7.7)
Neutrophils Relative %: 68.1 % (ref 43.0–77.0)
Platelets: 162 10*3/uL (ref 150.0–400.0)
RBC: 4.21 Mil/uL — ABNORMAL LOW (ref 4.22–5.81)
RDW: 14.7 % (ref 11.5–15.5)
WBC: 7.9 10*3/uL (ref 4.0–10.5)

## 2021-03-16 LAB — VITAMIN B12: Vitamin B-12: >1550 pg/mL — ABNORMAL HIGH (ref 211–911)

## 2021-03-16 LAB — FERRITIN: Ferritin: 71.7 ng/mL (ref 22.0–322.0)

## 2021-03-26 ENCOUNTER — Telehealth: Payer: Self-pay

## 2021-03-26 NOTE — Telephone Encounter (Signed)
Called the patient to discuss scheduling. Records not reviewed by anesthesia, but looks like it is a "go" for the [place].  Patient did not answer. Left a message to call back to schedule his appointments.

## 2021-03-26 NOTE — Telephone Encounter (Signed)
-----   Message from Gatha Mayer, MD sent at 03/26/2021  7:43 AM EDT ----- Regarding: RE: Echo and EF Manish,  Thanks.  Nevin Bloodgood - he is cleared for colonoscopy + should be ok for [place] based upon all we know. We can hold Plavix x 5 d and continue ASA.  I have cced Osvaldo Angst for completeness in case I have overlooked anything re: appropriateness for [place] anesthesia.  Please set him up (have cced Beth) unless Jenny Reichmann has ?'s  I will addend the office visit to update   Glendell Docker   ----- Message ----- From: Nigel Mormon, MD Sent: 03/25/2021   9:01 PM EDT To: Gatha Mayer, MD Subject: RE: Echo and EF                                Sorry about this delay. There was some technical issue with the study. I have completed the report. Overall, EF is 50-55%, study is unchanged compared to 2019. Okay to proceed with colonoscopy.  Thanks [employer]     ----- Message ----- From: Gatha Mayer, MD Sent: 03/25/2021   4:17 PM EDT To: Nigel Mormon, MD, Willia Craze, NP Subject: Echo and EF                                    Hi Manish,  Was hoping to see Echo report to sort out scheduling of colonoscopy.  Looks like done but not read?  Thanks for your help.  Glendell Docker ----- Message ----- From: Willia Craze, NP Sent: 03/05/2021   8:30 AM EDT To: Gatha Mayer, MD  Good morning,  Do you have concerns about this patient having a colonoscopy and if not then okay for [place]?  He had a positive Cologuard 2 years ago. His EF is now okay but see Lexiscan which is described as high risk study.  Thanks, Pg

## 2021-03-26 NOTE — Telephone Encounter (Signed)
Spoke with the patient. Agrees to pre-op visit 03/30/21 and colonoscopy on 04/16/21. He said the doctor told him he could not have a refill on the Plavix until after the colonoscopy, so he is off the medication now.

## 2021-03-27 NOTE — Progress Notes (Signed)
Called and spoke with patient regarding her echocardiogram results.

## 2021-03-30 ENCOUNTER — Other Ambulatory Visit: Payer: Self-pay

## 2021-03-30 ENCOUNTER — Ambulatory Visit (AMBULATORY_SURGERY_CENTER): Payer: Self-pay | Admitting: *Deleted

## 2021-03-30 VITALS — Ht 70.0 in | Wt 185.0 lb

## 2021-03-30 DIAGNOSIS — R195 Other fecal abnormalities: Secondary | ICD-10-CM

## 2021-03-30 NOTE — Progress Notes (Signed)
No egg or soy allergy known to patient  No issues with past sedation with any surgeries or procedures Patient denies ever being told they had issues or difficulty with intubation  No FH of Malignant Hyperthermia No diet pills per patient No home 02 use per patient  No blood thinners per patient - PT IS OFF PLAVIX UNTIL AFTER THE COLONOSCOPY  Pt denies issues with constipation  No A fib or A flutter  EMMI video to pt or via Amber 19 guidelines implemented in PV today with Pt and RN   Pt is fully vaccinated  for Covid   Due to the COVID-19 pandemic we are asking patients to follow certain guidelines.  Pt aware of COVID protocols and [facility] guidelines   PT  VERBALIZED SEVERAL TIMES HE WILL NOT BE ABLE TO FOLLOW PREP DIRECTIONS- WE DISCUSSED PREP SEVERAL TIMES IN PV- HE CONTINUED TO STATE HE COULDN'T REMEMBER ALL THIS - I ADVISED HIM TO GET HIS DAUGHTER TO LOOK AT INSTRUCTIONS AND HELP HIM-

## 2021-03-31 ENCOUNTER — Encounter: Payer: Self-pay | Admitting: Internal Medicine

## 2021-04-16 ENCOUNTER — Encounter: Payer: Self-pay | Admitting: Internal Medicine

## 2021-04-16 ENCOUNTER — Other Ambulatory Visit: Payer: Self-pay

## 2021-04-16 ENCOUNTER — Ambulatory Visit (AMBULATORY_SURGERY_CENTER): Payer: Medicare Other | Admitting: Internal Medicine

## 2021-04-16 VITALS — BP 135/66 | HR 56 | Temp 97.3°F | Resp 26 | Ht 70.0 in | Wt 185.0 lb

## 2021-04-16 DIAGNOSIS — R195 Other fecal abnormalities: Secondary | ICD-10-CM

## 2021-04-16 DIAGNOSIS — D125 Benign neoplasm of sigmoid colon: Secondary | ICD-10-CM

## 2021-04-16 DIAGNOSIS — D128 Benign neoplasm of rectum: Secondary | ICD-10-CM | POA: Diagnosis not present

## 2021-04-16 DIAGNOSIS — K5730 Diverticulosis of large intestine without perforation or abscess without bleeding: Secondary | ICD-10-CM | POA: Diagnosis not present

## 2021-04-16 DIAGNOSIS — D127 Benign neoplasm of rectosigmoid junction: Secondary | ICD-10-CM

## 2021-04-16 DIAGNOSIS — D120 Benign neoplasm of cecum: Secondary | ICD-10-CM

## 2021-04-16 DIAGNOSIS — K648 Other hemorrhoids: Secondary | ICD-10-CM | POA: Diagnosis not present

## 2021-04-16 DIAGNOSIS — D123 Benign neoplasm of transverse colon: Secondary | ICD-10-CM | POA: Diagnosis not present

## 2021-04-16 MED ORDER — SODIUM CHLORIDE 0.9 % IV SOLN: 500.0000 mL | Freq: Once | INTRAVENOUS | Status: DC

## 2021-04-16 NOTE — Patient Instructions (Addendum)
I found and removed 4 tiny polyps. All tiny and look benign.   You also have a condition called diverticulosis - common and not usually a problem. Please read the handout provided.  Hemorrhoids also seen.  I will analyze the polyps and let you know but am not recommending any more routine testing.  I appreciate the opportunity to care for you. Gatha Mayer, MD, [facility]  Handouts on polyps, diverticulosis, and hemorrhoids given to patient. Await pathology results.   YOU HAD AN ENDOSCOPIC PROCEDURE TODAY AT [facility]:   Refer to the procedure report that was given to you for any specific questions about what was found during the examination.  If the procedure report does not answer your questions, please call your gastroenterologist to clarify.  If you requested that your care partner not be given the details of your procedure findings, then the procedure report has been included in a sealed envelope for you to review at your convenience later.  YOU SHOULD EXPECT: Some feelings of bloating in the abdomen. Passage of more gas than usual.  Walking can help get rid of the air that was put into your GI tract during the procedure and reduce the bloating. If you had a lower endoscopy (such as a colonoscopy or flexible sigmoidoscopy) you Strohmeier notice spotting of blood in your stool or on the toilet paper. If you underwent a bowel prep for your procedure, you Redditt not have a normal bowel movement for a few days.  Please Note:  You might notice some irritation and congestion in your nose or some drainage.  This is from the oxygen used during your procedure.  There is no need for concern and it should clear up in a day or so.  SYMPTOMS TO REPORT IMMEDIATELY:  Following lower endoscopy (colonoscopy or flexible sigmoidoscopy):  Excessive amounts of blood in the stool  Significant tenderness or worsening of abdominal pains  Swelling of the abdomen that is new, acute  Fever of 100F or  higher   For urgent or emergent issues, a gastroenterologist can be reached at any hour by calling [phone]. Do not use MyChart messaging for urgent concerns.    DIET:  We do recommend a small meal at first, but then you Berman proceed to your regular diet.  Drink plenty of fluids but you should avoid alcoholic beverages for 24 hours.  ACTIVITY:  You should plan to take it easy for the rest of today and you should NOT DRIVE or use heavy machinery until tomorrow (because of the sedation medicines used during the test).    FOLLOW UP: Our staff will call the number listed on your records 48-72 hours following your procedure to check on you and address any questions or concerns that you Filice have regarding the information given to you following your procedure. If we do not reach you, we will leave a message.  We will attempt to reach you two times.  During this call, we will ask if you have developed any symptoms of COVID 19. If you develop any symptoms (ie: fever, flu-like symptoms, shortness of breath, cough etc.) before then, please call [phone].  If you test positive for Covid 19 in the 2 weeks post procedure, please call and report this information to [place].    If any biopsies were taken you will be contacted by phone or by letter within the next 1-3 weeks.  Please call us at [phone] if you have not heard about  the biopsies in 3 weeks.    SIGNATURES/CONFIDENTIALITY: You and/or your care partner have signed paperwork which will be entered into your electronic medical record.  These signatures attest to the fact that that the information above on your After Visit Summary has been reviewed and is understood.  Full responsibility of the confidentiality of this discharge information lies with you and/or your care-partner.

## 2021-04-16 NOTE — Progress Notes (Signed)
[facility] History and Physical   Primary Care Physician:  Wendie Agreste, MD   Reason for Procedure:   + cologuard test  Plan:    colonoscopy     HPI: Gilbert [name] is a 70 y.o. male w/ + cologuard test. Has seen cardiology - Plavix held. Ready for colonoscopy.   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 Used Impella Circulatory assist device. EF= 20-25%   History of epilepsy    at least 10 years ago. 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 EF 45% 04/10/2014   Therapeutic drug monitoring     Past Surgical History:  Procedure Laterality Date   BALLOON DILATION N/A 01/21/2015   Procedure: BALLOON DILATION;  Surgeon: Gatha Mayer, MD;  Location: [facility];  Service: Endoscopy;  Laterality: N/A;   CARDIAC CATHETERIZATION N/A  06/10/2015   Procedure: Left Heart Cath and Coronary Angiography;  Surgeon: Adrian Prows, MD;  Location: [facility];  Service: Cardiovascular;  Laterality: N/A;   CORONARY ANGIOPLASTY WITH STENT PLACEMENT  08/02/2013   Left main coronary artery stenting on emergent basis along with circulatory support with Impella device through left leg. With 4.0 x 18 mm Xience alpine stent   EAR CYST EXCISION Left 05/31/2018   Procedure: EXCISION LEFT POSTERIOR EAR TUMOR;  Surgeon: Leta Baptist, MD;  Location: [facility];  Service: ENT;  Laterality: Left;   ESOPHAGOGASTRODUODENOSCOPY     ESOPHAGOGASTRODUODENOSCOPY N/A 01/21/2015   Procedure: ESOPHAGOGASTRODUODENOSCOPY (EGD) with possible dilation.;  Surgeon: Gatha Mayer, MD;  Location: [facility];  Service: Endoscopy;  Laterality: N/A;   EYE SURGERY Left 2 weeks ago   torn retina   ILIAC ARTERY STENT Left 05/28/2014   dr Einar Gip   LEFT HEART CATHETERIZATION WITH CORONARY ANGIOGRAM N/A 11/27/2013   Procedure: LEFT HEART CATHETERIZATION WITH CORONARY ANGIOGRAM;  Surgeon: [name], MD;  Location: [facility];  Service: Cardiovascular;  Laterality: N/A;   LOWER EXTREMITY ANGIOGRAM N/A 02/26/2014   Procedure: LOWER EXTREMITY ANGIOGRAM;  Surgeon: [name], MD;  Location: [facility] C[facility] LAB;  Service: Cardiovascular;  Laterality: N/A;   LOWER EXTREMITY ANGIOGRAM N/A 05/28/2014   Procedure:  LOWER EXTREMITY ANGIOGRAM;  Surgeon: [name], MD;  Location: [facility];  Service: Cardiovascular;  Laterality: N/A;   NEPHRECTOMY  11/30/2013   PERCUTANEOUS CORONARY STENT INTERVENTION (PCI-S)  11/27/2013   Procedure: PERCUTANEOUS CORONARY STENT INTERVENTION (PCI-S);  Surgeon: [name], MD;  Location: [facility] CATH LAB;  Service: Cardiovascular;;   ROBOT ASSISTED LAPAROSCOPIC NEPHRECTOMY Right 10/19/2013   Procedure: ROBOTIC ASSISTED LAPAROSCOPIC NEPHRECTOMY,  EXTENSIVE ADHESIOLYSIS;  Surgeon: Alexis Frock, MD;  Location: [facility];  Service: Urology;   Laterality: Right;   SKIN FULL THICKNESS GRAFT Left 05/31/2018   Procedure: SKIN GRAFT FULL THICKNESS FROM LEFT ABDOMEN TO LEFT EAR;  Surgeon: Leta Baptist, MD;  Location: [facility];  Service: ENT;  Laterality: Left;    Prior to Admission medications   Medication Sig Start Date End Date Taking? Authorizing Provider  acetaminophen (TYLENOL) 500 MG tablet Take 500 mg by mouth every 6 (six) hours as needed.   Yes [provider]  amLODipine (NORVASC) 10 MG tablet Take 0.5 tablets (5 mg total) by mouth daily. 03/05/21  Yes Patwardhan, Reynold Bowen, MD  atorvastatin (LIPITOR) 80 MG tablet Take 1 tablet by mouth once daily 02/23/21  Yes Patwardhan, Manish J, MD  carvedilol (COREG) 12.5 MG tablet Take 1 tablet (12.5 mg total) by mouth 2 (two) times daily with a meal. 07/29/20  Yes Cantwell, Celeste C, PA-C  Cyanocobalamin (B-12) 5000 MCG CAPS Take 5,000 mcg by mouth every morning.    Yes [provider]  ENTRESTO 97-103 MG Take 1 tablet by mouth twice daily 02/27/21  Yes Adrian Prows, MD  famotidine (PEPCID) 20 MG tablet Take 20 mg by mouth 2 (two) times daily.   Yes [provider]  furosemide (LASIX) 40 MG tablet TAKE 1/2 (ONE-HALF) TABLET BY MOUTH ONCE DAILY AS NEEDED 07/30/20  Yes Patwardhan, Manish J, MD  phenytoin (DILANTIN) 300 MG ER capsule Take 1 capsule (300 mg total) by mouth daily. 01/13/21  Yes Wendie Agreste, MD  potassium chloride (KLOR-CON) 10 MEQ tablet Take 1 tablet (10 mEq total) by mouth daily. 02/03/21  Yes Wendie Agreste, MD  pyridOXINE (VITAMIN B-6) 100 MG tablet Take 100 mg by mouth every morning.    Yes [provider]  Thiamine HCl (VITAMIN B-1) 250 MG tablet Take 250 mg by mouth every morning.    Yes [provider]  clopidogrel (PLAVIX) 75 MG tablet Take 1 tablet by mouth once daily 11/17/20   Cantwell, Celeste C, PA-C  ezetimibe (ZETIA) 10 MG tablet Take 1 tablet by mouth once daily 03/02/21   Patwardhan, Reynold Bowen, MD    Current Outpatient  Medications  Medication Sig Dispense Refill   acetaminophen (TYLENOL) 500 MG tablet Take 500 mg by mouth every 6 (six) hours as needed.     amLODipine (NORVASC) 10 MG tablet Take 0.5 tablets (5 mg total) by mouth daily. 1 tablet 0   atorvastatin (LIPITOR) 80 MG tablet Take 1 tablet by mouth once daily 90 tablet 0   carvedilol (COREG) 12.5 MG tablet Take 1 tablet (12.5 mg total) by mouth 2 (two) times daily with a meal. 180 tablet 3   Cyanocobalamin (B-12) 5000 MCG CAPS Take 5,000 mcg by mouth every morning.      ENTRESTO 97-103 MG Take 1 tablet by mouth twice daily 180 tablet 1   famotidine (PEPCID) 20 MG tablet Take 20 mg by mouth 2 (two) times daily.     furosemide (LASIX) 40 MG tablet TAKE 1/2 (ONE-HALF)  TABLET BY MOUTH ONCE DAILY AS NEEDED 45 tablet 6   phenytoin (DILANTIN) 300 MG ER capsule Take 1 capsule (300 mg total) by mouth daily. 90 capsule 1   potassium chloride (KLOR-CON) 10 MEQ tablet Take 1 tablet (10 mEq total) by mouth daily. 30 tablet 3   pyridOXINE (VITAMIN B-6) 100 MG tablet Take 100 mg by mouth every morning.      Thiamine HCl (VITAMIN B-1) 250 MG tablet Take 250 mg by mouth every morning.      clopidogrel (PLAVIX) 75 MG tablet Take 1 tablet by mouth once daily 90 tablet 0   ezetimibe (ZETIA) 10 MG tablet Take 1 tablet by mouth once daily 90 tablet 0   Current Facility-Administered Medications  Medication Dose Route Frequency Provider Last Rate Last Admin   0.9 %  sodium chloride infusion  500 mL Intravenous Once Gatha Mayer, MD       Facility-Administered Medications Ordered in Other Visits  Medication Dose Route Frequency Provider Last Rate Last Admin   sodium chloride flush (NS) 0.9 % injection 10 mL  10 mL Intravenous PRN Volanda Napoleon, MD   10 mL at 05/05/16 1127    Allergies as of 04/16/2021 - Review Complete 04/16/2021  Allergen Reaction Noted   Oxycodone Swelling and Other (See Comments) 12/02/2013    Family History  Problem Relation Age of Onset    Stroke Mother    Emphysema Father    Heart disease Brother        multiple stents   Hyperlipidemia Brother    Stomach cancer Maternal Grandmother    Colon cancer Neg Hx    Colon polyps Neg Hx    Esophageal cancer Neg Hx    Rectal cancer Neg Hx     Social History   Socioeconomic History   Marital status: Divorced    Spouse name: Not on file   Number of children: 2   Years of education: Not on file   Highest education level: Not on file  Occupational History   Occupation: retired  Tobacco Use   Smoking status: Former    Packs/day: 2.00    Years: 45.00    Pack years: 90.00    Types: Cigarettes    Quit date: 08/03/2003    Years since quitting: 17.7   Smokeless tobacco: Current    Types: Snuff   Tobacco comments:    quit  smoking 10 years agop  Vaping Use   Vaping Use: Never used  Substance and Sexual Activity   Alcohol use: No    Alcohol/week: 0.0 standard drinks    Comment: quit 1980   Drug use: No    Comment: none in past 30 years    Sexual activity: Not on file  Other Topics Concern   Not on file  Social History Narrative   ** Merged History Encounter **       Social Determinants of Health   Financial Resource Strain: Not on file  Food Insecurity: Not on file  Transportation Needs: Not on file  Physical Activity: Not on file  Stress: Not on file  Social Connections: Not on file  Intimate Partner Violence: Not on file    Review of Systems:  other review of systems negative except as mentioned in the HPI.  Physical Exam: Vital signs BP (!) 159/76   Pulse (!) 59   Temp (!) 97.3 F (36.3 C)   Ht '5\' 10"'$  (1.778 m)   Wt 185 lb (83.9 kg)  SpO2 100%   BMI 26.54 kg/m   General:   Alert,  Well-developed, well-nourished, pleasant and cooperative in NAD Lungs:  Clear throughout to auscultation.   Heart:  Regular rate and rhythm; no murmurs, clicks, rubs,  or gallops. Abdomen:  Soft, nontender and nondistended. Normal bowel sounds.   Neuro/Psych:  Alert  and cooperative. Normal mood and affect. A and O x 3   '@Dmitri Pettigrew'$  Simonne Maffucci, MD, [facility] [phone] (pager) 04/16/2021 3:36 PM@

## 2021-04-16 NOTE — Op Note (Signed)
[place] Patient Name: Gilbert [name] Procedure Date: 04/16/2021 3:35 PM MRN: [id number] Endoscopist: Gatha Mayer , MD Age: 70 Referring MD:  Date of Birth: [id number]-Kainz-1952 Gender: Male Account #: [id number] Procedure:                Colonoscopy Indications:              Positive Cologuard test Medicines:                Propofol per Anesthesia, Monitored Anesthesia Care Procedure:                Pre-Anesthesia Assessment:                           - Prior to the procedure, a History and Physical                            was performed, and patient medications and                            allergies were reviewed. The patient's tolerance of                            previous anesthesia was also reviewed. The risks                            and benefits of the procedure and the sedation                            options and risks were discussed with the patient.                            All questions were answered, and informed consent                            was obtained. Prior Anticoagulants: The patient                            last took Plavix (clopidogrel) 14 days prior to the                            procedure. ASA Grade Assessment: III - A patient                            with severe systemic disease. After reviewing the                            risks and benefits, the patient was deemed in                            satisfactory condition to undergo the procedure.                           After obtaining informed consent, the colonoscope  was passed under direct vision. Throughout the                            procedure, the patient's blood pressure, pulse, and                            oxygen saturations were monitored continuously. The                            Olympus CF-HQ190L (Serial# [id number]) Colonoscope was                            introduced through the anus and advanced to the the                            cecum,  identified by appendiceal orifice and                            ileocecal valve. The colonoscopy was performed                            without difficulty. The patient tolerated the                            procedure well. The quality of the bowel                            preparation was adequate. The ileocecal valve,                            appendiceal orifice, and rectum were photographed. Scope In: 3:47:40 PM Scope Out: 4:04:15 PM Scope Withdrawal Time: 0 hours 13 minutes 11 seconds  Total Procedure Duration: 0 hours 16 minutes 35 seconds  Findings:                 The perianal and digital rectal examinations were                            normal. Pertinent negatives include normal prostate                            (size, shape, and consistency).                           Two sessile polyps were found in the rectum and                            sigmoid colon. The polyps were diminutive in size.                            These polyps were removed with a cold snare.                            Resection and retrieval were complete. Verification  of patient identification for the specimen was                            done. Estimated blood loss was minimal.                           Two sessile polyps were found in the transverse                            colon and cecum. The polyps were 1 to 2 mm in size.                            These polyps were removed with a cold biopsy                            forceps. Resection and retrieval were complete.                            Verification of patient identification for the                            specimen was done. Estimated blood loss was minimal.                           Multiple diverticula were found in the left colon.                           Internal hemorrhoids were found.                           The exam was otherwise without abnormality on                            direct and  retroflexion views. Complications:            No immediate complications. Estimated Blood Loss:     Estimated blood loss was minimal. Impression:               - Two diminutive polyps in the rectum and in the                            sigmoid colon, removed with a cold snare. Resected                            and retrieved.                           - Two 1 to 2 mm polyps in the transverse colon and                            in the cecum, removed with a cold biopsy forceps.                            Resected and retrieved.                           -  Diverticulosis in the left colon.                           - Internal hemorrhoids.                           - The examination was otherwise normal on direct                            and retroflexion views. Recommendation:           - Patient has a contact number available for                            emergencies. The signs and symptoms of potential                            delayed complications were discussed with the                            patient. Return to normal activities tomorrow.                            Written discharge instructions were provided to the                            patient.                           - Resume previous diet.                           - Continue present medications.                           - Resume Plavix (clopidogrel) at prior dose                            tomorrow.                           - No repeat colonoscopy due to age. Gatha Mayer, MD 04/16/2021 4:15:02 PM This report has been signed electronically.

## 2021-04-16 NOTE — Progress Notes (Signed)
Called to room to assist during endoscopic procedure.  Patient ID and intended procedure confirmed with present staff. Received instructions for my participation in the procedure from the performing physician.  

## 2021-04-16 NOTE — Progress Notes (Signed)
PT taken to PACU. Monitors in place. VSS. Report given to RN. 

## 2021-04-20 ENCOUNTER — Telehealth: Payer: Self-pay | Admitting: *Deleted

## 2021-04-20 NOTE — Telephone Encounter (Signed)
Attempted to call patient for their post-procedure follow-up call. No answer. Left voicemail.   

## 2021-04-20 NOTE — Telephone Encounter (Signed)
Attempted 2nd f/u phone call. No answer. Left message.  °

## 2021-04-24 ENCOUNTER — Encounter: Payer: Self-pay | Admitting: Internal Medicine

## 2021-05-30 ENCOUNTER — Other Ambulatory Visit: Payer: Self-pay | Admitting: Cardiology

## 2021-06-08 ENCOUNTER — Ambulatory Visit: Payer: Medicare Other | Admitting: Cardiology

## 2021-06-08 ENCOUNTER — Other Ambulatory Visit: Payer: Self-pay

## 2021-06-08 ENCOUNTER — Encounter: Payer: Self-pay | Admitting: Cardiology

## 2021-06-08 VITALS — BP 122/67 | HR 56 | Temp 97.8°F | Resp 16 | Ht 70.0 in | Wt 183.0 lb

## 2021-06-08 DIAGNOSIS — I2510 Atherosclerotic heart disease of native coronary artery without angina pectoris: Secondary | ICD-10-CM

## 2021-06-08 DIAGNOSIS — I10 Essential (primary) hypertension: Secondary | ICD-10-CM

## 2021-06-08 DIAGNOSIS — I5022 Chronic systolic (congestive) heart failure: Secondary | ICD-10-CM

## 2021-06-08 DIAGNOSIS — I739 Peripheral vascular disease, unspecified: Secondary | ICD-10-CM

## 2021-06-08 MED ORDER — ENTRESTO 49-51 MG PO TABS: 1.0000 | ORAL_TABLET | Freq: Two times a day (BID) | ORAL | 3 refills | Status: DC

## 2021-06-08 NOTE — Progress Notes (Signed)
Follow up visit  Subjective:   Gilbert [name], male    DOB: [birth year], 70 y.o.   MRN: [id number]  HPI   Chief Complaint  Patient presents with   Coronary Artery Disease   PAD   Follow-up    3 month    70 y.o. Caucasian male  with CAD s/p MI, LM stenting 2015, ischemic cardiomyopathy with recovery in EF to 55% on guideline directed medical therapy, PAD s/p Left external iliac/common femoral stenting 2015, h/o left nephrectomy for renal cell carcinoma, h/o NHL, currently in remission.  Patient denies any complaints today. He recently underwent colonoscopy, reportedly with no major abnormalities, according to the patient.    Current Outpatient Medications on File Prior to Visit  Medication Sig Dispense Refill   acetaminophen (TYLENOL) 500 MG tablet Take 500 mg by mouth every 6 (six) hours as needed.     amLODipine (NORVASC) 10 MG tablet Take 0.5 tablets (5 mg total) by mouth daily. 1 tablet 0   atorvastatin (LIPITOR) 80 MG tablet Take 1 tablet by mouth once daily 90 tablet 0   carvedilol (COREG) 12.5 MG tablet Take 1 tablet (12.5 mg total) by mouth 2 (two) times daily with a meal. 180 tablet 3   clopidogrel (PLAVIX) 75 MG tablet Take 75 mg by mouth daily.     Cyanocobalamin (B-12) 5000 MCG CAPS Take 5,000 mcg by mouth every morning.      ENTRESTO 97-103 MG Take 1 tablet by mouth twice daily 180 tablet 1   ezetimibe (ZETIA) 10 MG tablet Take 1 tablet by mouth once daily 90 tablet 0   famotidine (PEPCID) 20 MG tablet Take 20 mg by mouth 2 (two) times daily.     furosemide (LASIX) 40 MG tablet TAKE 1/2 (ONE-HALF) TABLET BY MOUTH ONCE DAILY AS NEEDED 45 tablet 6   phenytoin (DILANTIN) 300 MG ER capsule Take 1 capsule (300 mg total) by mouth daily. 90 capsule 1   potassium chloride (KLOR-CON) 10 MEQ tablet Take 1 tablet (10 mEq total) by mouth daily. 30 tablet 3   pyridOXINE (VITAMIN B-6) 100 MG tablet Take 100 mg by mouth every morning.      Thiamine HCl (VITAMIN B-1) 250 MG tablet Take  250 mg by mouth every morning.      Current Facility-Administered Medications on File Prior to Visit  Medication Dose Route Frequency Provider Last Rate Last Admin   sodium chloride flush (NS) 0.9 % injection 10 mL  10 mL Intravenous PRN Volanda Napoleon, MD   10 mL at 05/05/16 1127    Cardiovascular & other pertient studies:  Echocardiogram 03/11/2021:  LV endocardial border difficult to visualize. However, LV cavity, wall  thickness, and systolic function is probably normal with LVEF around  50-55%. Grade 1 diastolic function. Normal left atrial pressure.    Mild (Grade I) mitral regurgitation. Mild tricuspid regurgitation. Peak  RA-RV gradient 21 mmHg.  The IVC is not well visualized.  Pericardial fat pad seen. Appearance similar to previous echocardiogram in  2019.  Overall, no significant change noted compared to previous study in 2019.   EKG 03/05/2021: Sinus rhythm 56 bpm Old inferior infarct Inferolateral ST depression, nonspecific  Carotid artery duplex 08/10/2018: Stenosis in the right ICA of 50-69%. Right common carotid artery and right external carotid artery stenosis of (<50%). Stenosis in the left internal carotid artery (16-49%).  Antegrade right vertebral artery flow. Antegrade left vertebral artery flow. Compared to the study done on 01/27/2018,  right ICA stenosis  is new.  Left ICA stenosis was >50%. F/U in 6 months.  Lexiscan thalium stress test 06/09/2017: 1. Prognostically, this is a high risk study. Resting EKG demonstrates ectopic atrial rhythm and sinus rhythm with LVH with repolarization, cannot exclude lateral ischemia. Stress EKG is nondiagnostic for ischemia as is the pharmacologic stress test. Stress symptoms included dyspnea and dizziness. 2. There is a mild area of ischemia of small size in the septal wall.  There is very large scar of the inferolateral segment with very mild residual ischemia. 3. Compared to the prior study dated 05/09/2015, the current  study now reveals Inferior scar with mild lateral ischemia new. EF has decreaed from 30% to 17%.  Recent labs: 01/28/2021: Glucose 83, BUN/Cr 14/1.41. EGFR 50. Na/K 142/3.3. AlKP 156. Rest of the CMP normal Chol 168, TG 86, HDL 64, LDL 86  01/22/2020: Chol 173, TG 94, HDL 66, LDL 90  07/11/2019: Glucose 86, BUN/Cr 13/1.25. EGFR 59. Na/K 140/3.4. Rest of the CMP normal H/H 12.7/36.1. MCV 87. Platelets 180 Chol 194, TG 136, HDL 59, LDL 111   Review of Systems  Cardiovascular:  Negative for chest pain, dyspnea on exertion, leg swelling, palpitations and syncope.  Neurological:  Positive for dizziness and light-headedness.       Vitals:   06/08/21 1258  BP: 122/67  Pulse: (!) 56  Resp: 16  Temp: 97.8 F (36.6 C)  SpO2: 96%   Orthostatic VS for the past 72 hrs (Last 3 readings):  Orthostatic BP Patient Position BP Location Cuff Size Orthostatic Pulse  06/08/21 1310 100/64 Standing Left Arm Normal 64  06/08/21 1309 120/67 Sitting Left Arm Normal 59  06/08/21 1307 137/69 Supine Left Arm Normal 58     Body mass index is 26.26 kg/m. Filed Weights   06/08/21 1258  Weight: 183 lb (83 kg)    Objective:   Physical Exam Vitals and nursing note reviewed.  Constitutional:      General: He is not in acute distress. Neck:     Vascular: No JVD.  Cardiovascular:     Rate and Rhythm: Normal rate and regular rhythm.     Pulses:          Dorsalis pedis pulses are 0 on the right side and 0 on the left side.       Posterior tibial pulses are 1+ on the right side and 1+ on the left side.     Heart sounds: Normal heart sounds. No murmur heard. Pulmonary:     Effort: Pulmonary effort is normal.     Breath sounds: Normal breath sounds. No wheezing or rales.  Musculoskeletal:     Right lower leg: No edema.     Left lower leg: No edema.          Assessment & Recommendations:   70 y/o Caucasian male with CAD s/p MI, LM stenting 2015, ischemic cardiomyopathy with recovery in EF  to 55% on guideline directed medical therapy, PAD s/p Left external iliac/common femoral stenting 2015, h/o left nephrectomy for renal cell carcinoma, h/o NHL, currently in remission.   Hypertension: Controlled, but with mild orthostatic hypotension. Stop amlodipine to 5 mg daily.   Ischemic cardiomyopathy: EF recoverred to 50-55% on guiideline directed medical thrapy. Continue coreg 12.5 mg bid Not on spironolactone due to improved NYHA symptoms and gynacomastia from spironolactone. Given his relative orthostatic hypotension, I have reduced Entresto 97-103 mg bid to 49-51 mg bid.  CAD: Stable. No angina symptoms. Continue lipitor 80 mg. Zetia.  Not interested in PCSK9 inhibitor.  Recommend Aspirin 81 mg daily.  PAD: Stable.  F/u in 3 months  Hoffman, MD [facility] Pager: [phone] Office: [phone]

## 2021-06-22 ENCOUNTER — Encounter: Payer: Self-pay | Admitting: Family Medicine

## 2021-06-22 ENCOUNTER — Ambulatory Visit (INDEPENDENT_AMBULATORY_CARE_PROVIDER_SITE_OTHER): Payer: Medicare Other | Admitting: Family Medicine

## 2021-06-22 VITALS — BP 130/78 | HR 75 | Temp 98.3°F | Resp 16 | Ht 70.0 in | Wt 186.8 lb

## 2021-06-22 DIAGNOSIS — E782 Mixed hyperlipidemia: Secondary | ICD-10-CM

## 2021-06-22 DIAGNOSIS — I10 Essential (primary) hypertension: Secondary | ICD-10-CM | POA: Diagnosis not present

## 2021-06-22 DIAGNOSIS — Z0000 Encounter for general adult medical examination without abnormal findings: Secondary | ICD-10-CM

## 2021-06-22 DIAGNOSIS — Z130 Encounter for screening for diseases of the blood and blood-forming organs and certain disorders involving the immune mechanism: Secondary | ICD-10-CM

## 2021-06-22 DIAGNOSIS — E785 Hyperlipidemia, unspecified: Secondary | ICD-10-CM

## 2021-06-22 DIAGNOSIS — Z131 Encounter for screening for diabetes mellitus: Secondary | ICD-10-CM

## 2021-06-22 NOTE — Progress Notes (Signed)
Subjective:  Patient ID: Gilbert [name], male    DOB: 1951/07/16  Age: 70 y.o. MRN: [id number]  CC:  Chief Complaint  Patient presents with   Medicare Wellness    Pt reports here for annual pt reports doing well no concerns no chest pain or SOB today    Immunizations    Discuss having pneumonia shot today     HPI Gilbert [name] presents for   Presents for annual wellness exam.  No chest pain, dyspnea or acute concerns today.  Care team: PCP: me  Cardiology Dr. Virgina Jock, chronic systolic heart failure, CAD, hypertension, PAD, office visit November 7.  Some mild orthostatic hypotension, amlodipine discontinued to 5 mg daily, Entresto reduced to 49/51 mg twice daily.  Recommend aspirin 81 mg daily, continued on Lipitor and Zetia for CAD, not interested in PCSK9 inhibitor.  No anginal symptoms. Still some lightheadedness with standing. Drinking fluids throughout the day, one meal per day.   Gastroenterology, Dr. Carlean Purl, colonoscopy 12/14/2020 with previous positive Cologuard screening test. 2 adenomas with hyperplastic and inflammatory polyps but no recall due to age and comorbidities.  Diverticulosis, internal hemorrhoids.  Nephrology: s/p R nephrectomy with CKD, Dr. Joelyn Oms.   Oncology - Dr. Marin Olp - diffuse large cell NHL, has port-a-cath.   Ortho: Dr. Tonita Cong  Fall screening Fall Risk  06/22/2021 06/20/2020 05/04/2019 10/30/2018 03/23/2018  Falls in the past year? 0 0 0 0 No  Number falls in past yr: 0 0 0 0 -  Injury with Fall? 0 0 0 0 -  Risk for fall due to : No Fall Risks - - - -  Follow up Falls evaluation completed Falls evaluation completed Falls evaluation completed - -   Lighting in home: adequate. Loose rugs/carpets/pets: none Stairs - 3 to front door with handrail  Grab bars in bathroom: has in MBR.  Timed up and go:12 seconds, some hesitation with standing with knee issues, slow sitting. Slowed gait, no instability.   Depression Screening: Depression screen [facility]  2/9 06/20/2020 05/04/2019 10/30/2018 03/23/2018 02/24/2018  Decreased Interest 0 0 0 0 0  Down, Depressed, Hopeless 0 0 0 0 0  PHQ - 2 Score 0 0 0 0 0  Some recent data might be hidden    Cancer Screening: Colonoscopy as above.  Hx of NHL as above - followed by Dr. Marin Olp.   Immunization History  Administered Date(s) Administered   Influenza, High Dose Seasonal PF 05/26/2017   PFIZER(Purple Top)SARS-COV-2 Vaccination 09/22/2019, 10/17/2019   Pneumococcal Conjugate-13 05/26/2017   Pneumococcal Polysaccharide-23 01/23/2015  Flu vaccine: declines Covid booster - due for bivalent. S/p 2 boosters.   Functional Status Survey: Is the patient deaf or have difficulty hearing?: No Does the patient have difficulty seeing, even when wearing glasses/contacts?: No Does the patient have difficulty concentrating, remembering, or making decisions?: No Does the patient have difficulty walking or climbing stairs?: No Does the patient have difficulty dressing or bathing?: No Does the patient have difficulty doing errands alone such as visiting a doctor's office or shopping?: No  Memory Screen: 6CIT Screen 06/22/2021  What Year? 0 points  What month? 0 points  What time? 0 points  Count back from 20 0 points  Months in reverse 0 points  Repeat phrase 0 points  Total Score 0    Alcohol Screening: no alcohol  Tobacco: Chewing tobacco. Declines cutting back, but Houchins need to due to cost.   Vision Screening   Right eye Left eye Both eyes  Without  correction 20/30 20/25 20/25   With correction     Optho/optometry: Dr. Katy Fitch - in past year.   Dental: None recently - natural teeth. Declines dental visit.   Exercise: Limited by knees, golfing 4 days per week.   Advanced Directives:  Has living will and HCPOA. No changes.   History Patient Active Problem List   Diagnosis Date Noted   Mixed hyperlipidemia 13/03/6577   Chronic systolic heart failure ([place]) 03/23/2019   CHF (congestive  heart failure) ([place]) 06/01/2017   AKI (acute kidney injury) ([place]) 04/07/2017   Orthostatic hypotension 04/07/2017   Diffuse non-Hodgkin's lymphoma of bone ([place]) 01/08/2016   Diffuse large cell non-Hodgkin's lymphoma ([place]) 01/07/2016   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   Hematuria 10/29/2015   Dysphagia, pharyngoesophageal phase    Early satiety    Esophageal stricture    Gastritis and gastroduodenitis    PAD (peripheral artery disease) ([place]) 05/28/2014   Cardiomyopathy, ischemic 04/03/2014   History of left heart catheterization ([facility]) 02/26/2014   Arterial bleed, intraoperative 02/26/2014   Seizures ([place]) 11/27/2013   Essential hypertension 11/27/2013   Hypomagnesemia 11/27/2013   Coronary artery disease involving native coronary artery of native heart without angina pectoris 11/26/2013   Renal cancer ([place]) 10/19/2013   Coronary artery calcification 09/25/2013   Renal cyst 08/30/2013   Loss of weight 08/03/2013   Past Medical History:  Diagnosis Date   Abnormal liver enzymes    Chronic alkaline phosphatase elevation since 2014   Acid reflux    CAD (coronary artery disease)    Cancer ([place]) 08/2013   right kidney cancer   Cancer ([place])    Coronary artery calcification seen on CAT scan    Coronary atherosclerosis of native coronary artery    Diffuse large cell non-Hodgkin's lymphoma ([place]) 01/07/2016   Diffuse non-Hodgkin's lymphoma of bone ([place]) 01/08/2016   Essential hypertension, benign    Family history of anesthesia complication    mother-had stroke during anesthesia   GERD (gastroesophageal reflux disease)    Gout    History of cardiac arrest 11/26/2013   PTCA/Stenting of LM & Prox LAD with 4.0 x 18 mm Xience alpine stent. Used Impella Circulatory assist device. EF= 20-25%   History of epilepsy    at least 10 years ago. 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 EF 45% 04/10/2014   Therapeutic drug monitoring    Past Surgical History:  Procedure Laterality Date   BALLOON DILATION N/A 01/21/2015   Procedure: BALLOON DILATION;  Surgeon: Gatha Mayer, MD;  Location: [facility];  Service: Endoscopy;  Laterality: N/A;   CARDIAC CATHETERIZATION N/A 06/10/2015   Procedure: Left Heart Cath and Coronary Angiography;  Surgeon: Adrian Prows, MD;  Location: [place] [facility] LAB;  Service: Cardiovascular;  Laterality: N/A;   CORONARY ANGIOPLASTY WITH STENT PLACEMENT  08/02/2013   Left main coronary artery stenting on emergent basis along with circulatory support with Impella device through  left leg. With 4.0 x 18 mm Xience alpine stent   EAR CYST EXCISION Left 05/31/2018   Procedure: EXCISION LEFT POSTERIOR EAR TUMOR;  Surgeon: Leta Baptist, MD;  Location: [facility];  Service: ENT;  Laterality: Left;   ESOPHAGOGASTRODUODENOSCOPY     ESOPHAGOGASTRODUODENOSCOPY N/A 01/21/2015   Procedure: ESOPHAGOGASTRODUODENOSCOPY (EGD) with possible dilation.;  Surgeon: Gatha Mayer, MD;  Location: [facility];  Service: Endoscopy;  Laterality: N/A;   EYE SURGERY Left 2 weeks ago   torn retina   ILIAC ARTERY STENT Left 05/28/2014   dr Einar Gip   LEFT HEART CATHETERIZATION WITH CORONARY ANGIOGRAM N/A 11/27/2013   Procedure: LEFT HEART CATHETERIZATION WITH CORONARY ANGIOGRAM;  Surgeon: [name], MD;  Location: [facility] CATH LAB;  Service: Cardiovascular;  Laterality: N/A;   LOWER EXTREMITY ANGIOGRAM N/A 02/26/2014   Procedure: LOWER EXTREMITY ANGIOGRAM;  Surgeon: [name], MD;  Location[facility] C[facility] LAB;  Service: Cardiovascular;   Laterality: N/A;   LOWER EXTREMITY ANGIOGRAM N/A 05/28/2014   Procedure: LOWER EXTREMITY ANGIOGRAM;  Surgeon: [name], MD;  Location: [facility] LAB;  Service: Cardiovascular;  Laterality: N/A;   NEPHRECTOMY  11/30/2013   PERCUTANEOUS CORONARY STENT INTERVENTION (PCI-S)  11/27/2013   Procedure: PERCUTANEOUS CORONARY STENT INTERVENTION (PCI-S);  Surgeon: [name], MD;  Location: [facility] CATH LAB;  Service: Cardiovascular;;   ROBOT ASSISTED LAPAROSCOPIC NEPHRECTOMY Right 10/19/2013   Procedure: ROBOTIC ASSISTED LAPAROSCOPIC NEPHRECTOMY,  EXTENSIVE ADHESIOLYSIS;  Surgeon: Alexis Frock, MD;  Location: [facility];  Service: Urology;  Laterality: Right;   SKIN FULL THICKNESS GRAFT Left 05/31/2018   Procedure: SKIN GRAFT FULL THICKNESS FROM LEFT ABDOMEN TO LEFT EAR;  Surgeon: Leta Baptist, MD;  Location: [facility];  Service: ENT;  Laterality: Left;   Allergies  Allergen Reactions   Oxycodone Swelling and Other (See Comments)    Tongue and lips swell   Prior to Admission medications   Medication Sig Start Date End Date Taking? Authorizing Provider  acetaminophen (TYLENOL) 500 MG tablet Take 500 mg by mouth every 6 (six) hours as needed.   Yes [provider]  aspirin EC 81 MG tablet Take 81 mg by mouth daily. Swallow whole.   Yes [provider]  atorvastatin (LIPITOR) 80 MG tablet Take 1 tablet by mouth once daily 06/01/21  Yes Patwardhan, Manish J, MD  carvedilol (COREG) 12.5 MG tablet Take 1 tablet (12.5 mg total) by mouth 2 (two) times daily with a meal. 07/29/20  Yes Cantwell, Celeste C, PA-C  Cyanocobalamin (B-12) 5000 MCG CAPS Take 5,000 mcg by mouth every morning.    Yes [provider]  ezetimibe (ZETIA) 10 MG tablet Take 1 tablet by mouth once daily 03/02/21  Yes Patwardhan, Manish J, MD  famotidine (PEPCID) 20 MG tablet Take 20 mg by mouth 2 (two) times daily.   Yes [provider]  furosemide (LASIX) 40 MG tablet TAKE 1/2 (ONE-HALF) TABLET BY MOUTH ONCE  DAILY AS NEEDED 07/30/20  Yes Patwardhan, Manish J, MD  phenytoin (DILANTIN) 300 MG ER capsule Take 1 capsule (300 mg total) by mouth daily. 01/13/21  Yes Wendie Agreste, MD  potassium chloride (KLOR-CON) 10 MEQ tablet Take 1 tablet (10 mEq total) by mouth daily. 02/03/21  Yes Wendie Agreste, MD  pyridOXINE (VITAMIN B-6) 100 MG tablet Take 100 mg by mouth every morning.    Yes [provider]  sacubitril-valsartan (ENTRESTO) 49-51 MG Take 1 tablet by mouth 2 (two) times daily. 06/08/21  Yes Patwardhan, Manish J,  MD  Thiamine HCl (VITAMIN B-1) 250 MG tablet Take 250 mg by mouth every morning.    Yes [provider]   Social History   Socioeconomic History   Marital status: Divorced    Spouse name: Not on file   Number of children: 2   Years of education: Not on file   Highest education level: Not on file  Occupational History   Occupation: retired  Tobacco Use   Smoking status: Former    Packs/day: 2.00    Years: 45.00    Pack years: 90.00    Types: Cigarettes    Quit date: 08/03/2003    Years since quitting: 17.8   Smokeless tobacco: Current    Types: Snuff   Tobacco comments:    quit  smoking 10 years agop  Vaping Use   Vaping Use: Never used  Substance and Sexual Activity   Alcohol use: No    Alcohol/week: 0.0 standard drinks    Comment: quit 1980   Drug use: No    Comment: none in past 30 years    Sexual activity: Not Currently  Other Topics Concern   Not on file  Social History Narrative   ** Merged History Encounter **       Social Determinants of Health   Financial Resource Strain: Not on file  Food Insecurity: Not on file  Transportation Needs: Not on file  Physical Activity: Not on file  Stress: Not on file  Social Connections: Not on file  Intimate Partner Violence: Not on file    Review of Systems   Objective:   Vitals:   06/22/21 1332  BP: 130/78  Pulse: 75  Resp: 16  Temp: 98.3 F (36.8 C)  TempSrc: Temporal  SpO2: 99%   Weight: 186 lb 12.8 oz (84.7 kg)  Height: 5\' 10"  (1.778 m)     Physical Exam Vitals reviewed.  Constitutional:      Appearance: He is well-developed.  HENT:     Head: Normocephalic and atraumatic.     Right Ear: External ear normal.     Left Ear: External ear normal.  Eyes:     Conjunctiva/sclera: Conjunctivae normal.     Pupils: Pupils are equal, round, and reactive to light.  Neck:     Thyroid: No thyromegaly.  Cardiovascular:     Rate and Rhythm: Normal rate and regular rhythm.     Heart sounds: Normal heart sounds.  Pulmonary:     Effort: Pulmonary effort is normal. No respiratory distress.     Breath sounds: Normal breath sounds. No wheezing.  Abdominal:     General: There is no distension.     Palpations: Abdomen is soft.     Tenderness: There is no abdominal tenderness.  Musculoskeletal:        General: No tenderness. Normal range of motion.     Cervical back: Neck supple.  Lymphadenopathy:     Cervical: No cervical adenopathy.  Skin:    General: Skin is warm and dry.  Neurological:     Mental Status: He is alert and oriented to person, place, and time.     Deep Tendon Reflexes: Reflexes are normal and symmetric.  Psychiatric:        Behavior: Behavior normal.     Assessment & Plan:  Gilbert [name] is a 70 y.o. male . Medicare annual wellness visit, subsequent  - - anticipatory guidance as below in AVS, screening labs if needed. Health maintenance items as above in HPI  discussed/recommended as applicable.  - no concerning responses on depression, fall, or functional status screening. Any positive responses noted as above. Advanced directives discussed as in CH[facility].   Essential hypertension - Plan: Comprehensive metabolic panel  -Still some possible orthostasis symptoms.  Recommended discussing with his cardiologist.  Continue fluids throughout the day and regular meals/snacks also discussed as potential contributor.  No changes based on in office blood pressure at  this time.  Hyperlipidemia, unspecified hyperlipidemia type - Plan: Lipid panel  -Check lipids.  Mixed hyperlipidemia - Plan: Lipid panel  Screening for deficiency anemia  Screening for diabetes mellitus - Plan: Hemoglobin A1c  -Check A1c with fasting labs.  Follow-up recommended with specialists.   No orders of the defined types were placed in this encounter.  Patient Instructions  I recommend discussing your meds with cardiology if you are still lightheaded with standing.  Return to the clinic or go to the nearest emergency room if any of your symptoms worsen or new symptoms occur. Blood pressure looks ok today. Make sure to stay hydrated, and some snacks during day in addition to meal [place] be needed.   It appears you [place] be due for follow up with oncology - call  Dr. Antonieta Pert office for appointment.   Have labs checked at [facility] lab on [address] in next few weeks.   Preventive Care 44 Years and Older, Male Preventive care refers to lifestyle choices and visits with your health care provider that can promote health and wellness. Preventive care visits are also called wellness exams. What can I expect for my preventive care visit? Counseling During your preventive care visit, your health care provider Yeats ask about your: Medical history, including: Past medical problems. Family medical history. History of falls. Current health, including: Emotional well-being. Home life and relationship well-being. Sexual activity. Memory and ability to understand (cognition). Lifestyle, including: Alcohol, nicotine or tobacco, and drug use. Access to firearms. Diet, exercise, and sleep habits. Work and work Statistician. Sunscreen use. Safety issues such as seatbelt and bike helmet use. Physical exam Your health care provider will check your: Height and weight. These Franze be used to calculate your BMI (body mass index). BMI is a measurement that tells if you are at a healthy weight. Waist  circumference. This measures the distance around your waistline. This measurement also tells if you are at a healthy weight and [place] help predict your risk of certain diseases, such as type 2 diabetes and high blood pressure. Heart rate and blood pressure. Body temperature. Skin for abnormal spots. What immunizations do I need? Vaccines are usually given at various ages, according to a schedule. Your health care provider will recommend vaccines for you based on your age, medical history, and lifestyle or other factors, such as travel or where you work. What tests do I need? Screening Your health care provider Rennels recommend screening tests for certain conditions. This Elie include: Lipid and cholesterol levels. Diabetes screening. This is done by checking your blood sugar (glucose) after you have not eaten for a while (fasting). Hepatitis C test. Hepatitis B test. HIV (human immunodeficiency virus) test. STI (sexually transmitted infection) testing, if you are at risk. Lung cancer screening. Colorectal cancer screening. Prostate cancer screening. Abdominal aortic aneurysm (AAA) screening. You Erven need this if you are a current or former smoker. Talk with your health care provider about your test results, treatment options, and if necessary, the need for more tests. Follow these instructions at home: Eating and drinking  Eat  a diet that includes fresh fruits and vegetables, whole grains, lean protein, and low-fat dairy products. Limit your intake of foods with high amounts of sugar, saturated fats, and salt. Take vitamin and mineral supplements as recommended by your health care provider. Do not drink alcohol if your health care provider tells you not to drink. If you drink alcohol: Limit how much you have to 0-2 drinks a day. Know how much alcohol is in your drink. In the [place] one drink equals one 12 oz bottle of beer (355 mL), one 5 oz glass of wine (148 mL), or one 1 oz glass of hard  liquor (44 mL). Lifestyle Brush your teeth every morning and night with fluoride toothpaste. Floss one time each day. Exercise for at least 30 minutes 5 or more days each week. Do not use any products that contain nicotine or tobacco. These products include cigarettes, chewing tobacco, and vaping devices, such as e-cigarettes. If you need help quitting, ask your health care provider. Do not use drugs. If you are sexually active, practice safe sex. Use a condom or other form of protection to prevent STIs. Take aspirin only as told by your health care provider. Make sure that you understand how much to take and what form to take. Work with your health care provider to find out whether it is safe and beneficial for you to take aspirin daily. Ask your health care provider if you need to take a cholesterol-lowering medicine (statin). Find healthy ways to manage stress, such as: Meditation, yoga, or listening to music. Journaling. Talking to a trusted person. Spending time with friends and family. Safety Always wear your seat belt while driving or riding in a vehicle. Do not drive: If you have been drinking alcohol. Do not ride with someone who has been drinking. When you are tired or distracted. While texting. If you have been using any mind-altering substances or drugs. Wear a helmet and other protective equipment during sports activities. If you have firearms in your house, make sure you follow all gun safety procedures. Minimize exposure to UV radiation to reduce your risk of skin cancer. What's next? Visit your health care provider once a year for an annual wellness visit. Ask your health care provider how often you should have your eyes and teeth checked. Stay up to date on all vaccines. This information is not intended to replace advice given to you by your health care provider. Make sure you discuss any questions you have with your health care provider. Document Revised: 01/14/2021  Document Reviewed: 01/14/2021 Elsevier Patient Education  2022 [place],   [name], MD [place], [facility] 06/22/21 6:46 PM

## 2021-06-22 NOTE — Patient Instructions (Addendum)
I recommend discussing your meds with cardiology if you are still lightheaded with standing.  Return to the clinic or go to the nearest emergency room if any of your symptoms worsen or new symptoms occur. Blood pressure looks ok today. Make sure to stay hydrated, and some snacks during day in addition to meal Pollitt be needed.   It appears you Ovens be due for follow up with oncology - call  Dr. Antonieta Pert office for appointment.   Have labs checked at [facility] lab on [address] in next few weeks.   Preventive Care 29 Years and Older, Male Preventive care refers to lifestyle choices and visits with your health care provider that can promote health and wellness. Preventive care visits are also called wellness exams. What can I expect for my preventive care visit? Counseling During your preventive care visit, your health care provider Onder ask about your: Medical history, including: Past medical problems. Family medical history. History of falls. Current health, including: Emotional well-being. Home life and relationship well-being. Sexual activity. Memory and ability to understand (cognition). Lifestyle, including: Alcohol, nicotine or tobacco, and drug use. Access to firearms. Diet, exercise, and sleep habits. Work and work Statistician. Sunscreen use. Safety issues such as seatbelt and bike helmet use. Physical exam Your health care provider will check your: Height and weight. These Relph be used to calculate your BMI (body mass index). BMI is a measurement that tells if you are at a healthy weight. Waist circumference. This measures the distance around your waistline. This measurement also tells if you are at a healthy weight and Theroux help predict your risk of certain diseases, such as type 2 diabetes and high blood pressure. Heart rate and blood pressure. Body temperature. Skin for abnormal spots. What immunizations do I need? Vaccines are usually given at various ages, according to a schedule.  Your health care provider will recommend vaccines for you based on your age, medical history, and lifestyle or other factors, such as travel or where you work. What tests do I need? Screening Your health care provider Parran recommend screening tests for certain conditions. This Forinash include: Lipid and cholesterol levels. Diabetes screening. This is done by checking your blood sugar (glucose) after you have not eaten for a while (fasting). Hepatitis C test. Hepatitis B test. HIV (human immunodeficiency virus) test. STI (sexually transmitted infection) testing, if you are at risk. Lung cancer screening. Colorectal cancer screening. Prostate cancer screening. Abdominal aortic aneurysm (AAA) screening. You Tootle need this if you are a current or former smoker. Talk with your health care provider about your test results, treatment options, and if necessary, the need for more tests. Follow these instructions at home: Eating and drinking  Eat a diet that includes fresh fruits and vegetables, whole grains, lean protein, and low-fat dairy products. Limit your intake of foods with high amounts of sugar, saturated fats, and salt. Take vitamin and mineral supplements as recommended by your health care provider. Do not drink alcohol if your health care provider tells you not to drink. If you drink alcohol: Limit how much you have to 0-2 drinks a day. Know how much alcohol is in your drink. In the [place] one drink equals one 12 oz bottle of beer (355 mL), one 5 oz glass of wine (148 mL), or one 1 oz glass of hard liquor (44 mL). Lifestyle Brush your teeth every morning and night with fluoride toothpaste. Floss one time each day. Exercise for at least 30 minutes 5 or more days  each week. Do not use any products that contain nicotine or tobacco. These products include cigarettes, chewing tobacco, and vaping devices, such as e-cigarettes. If you need help quitting, ask your health care provider. Do not use  drugs. If you are sexually active, practice safe sex. Use a condom or other form of protection to prevent STIs. Take aspirin only as told by your health care provider. Make sure that you understand how much to take and what form to take. Work with your health care provider to find out whether it is safe and beneficial for you to take aspirin daily. Ask your health care provider if you need to take a cholesterol-lowering medicine (statin). Find healthy ways to manage stress, such as: Meditation, yoga, or listening to music. Journaling. Talking to a trusted person. Spending time with friends and family. Safety Always wear your seat belt while driving or riding in a vehicle. Do not drive: If you have been drinking alcohol. Do not ride with someone who has been drinking. When you are tired or distracted. While texting. If you have been using any mind-altering substances or drugs. Wear a helmet and other protective equipment during sports activities. If you have firearms in your house, make sure you follow all gun safety procedures. Minimize exposure to UV radiation to reduce your risk of skin cancer. What's next? Visit your health care provider once a year for an annual wellness visit. Ask your health care provider how often you should have your eyes and teeth checked. Stay up to date on all vaccines. This information is not intended to replace advice given to you by your health care provider. Make sure you discuss any questions you have with your health care provider. Document Revised: 01/14/2021 Document Reviewed: 01/14/2021 Elsevier Patient Education  [place].

## 2021-07-04 ENCOUNTER — Other Ambulatory Visit: Payer: Self-pay | Admitting: Family Medicine

## 2021-07-04 DIAGNOSIS — G40909 Epilepsy, unspecified, not intractable, without status epilepticus: Secondary | ICD-10-CM

## 2021-07-06 ENCOUNTER — Other Ambulatory Visit: Payer: Self-pay

## 2021-07-06 DIAGNOSIS — I5022 Chronic systolic (congestive) heart failure: Secondary | ICD-10-CM

## 2021-07-06 MED ORDER — ENTRESTO 49-51 MG PO TABS: 1.0000 | ORAL_TABLET | Freq: Two times a day (BID) | ORAL | 3 refills | Status: DC

## 2021-07-07 ENCOUNTER — Telehealth: Payer: Self-pay

## 2021-07-07 NOTE — Telephone Encounter (Signed)
Patient came and dropped off patient assistance forms. Completed filling it out and faxed paperwork 07/06/2021.

## 2021-07-31 ENCOUNTER — Telehealth: Payer: Self-pay

## 2021-07-31 NOTE — Telephone Encounter (Signed)
Error

## 2021-09-02 ENCOUNTER — Other Ambulatory Visit: Payer: Self-pay | Admitting: Cardiology

## 2021-09-07 DIAGNOSIS — I129 Hypertensive chronic kidney disease with stage 1 through stage 4 chronic kidney disease, or unspecified chronic kidney disease: Secondary | ICD-10-CM | POA: Diagnosis not present

## 2021-09-07 DIAGNOSIS — N183 Chronic kidney disease, stage 3 unspecified: Secondary | ICD-10-CM | POA: Diagnosis not present

## 2021-09-07 DIAGNOSIS — R809 Proteinuria, unspecified: Secondary | ICD-10-CM | POA: Diagnosis not present

## 2021-09-09 ENCOUNTER — Ambulatory Visit: Payer: Medicare Other | Admitting: Cardiology

## 2021-09-09 ENCOUNTER — Other Ambulatory Visit: Payer: Self-pay

## 2021-09-09 ENCOUNTER — Encounter: Payer: Self-pay | Admitting: Cardiology

## 2021-09-09 VITALS — BP 142/84 | HR 63 | Temp 97.2°F | Resp 16 | Ht 70.0 in | Wt 185.8 lb

## 2021-09-09 DIAGNOSIS — R140 Abdominal distension (gaseous): Secondary | ICD-10-CM | POA: Diagnosis not present

## 2021-09-09 DIAGNOSIS — I5022 Chronic systolic (congestive) heart failure: Secondary | ICD-10-CM | POA: Diagnosis not present

## 2021-09-09 DIAGNOSIS — I739 Peripheral vascular disease, unspecified: Secondary | ICD-10-CM

## 2021-09-09 DIAGNOSIS — I2510 Atherosclerotic heart disease of native coronary artery without angina pectoris: Secondary | ICD-10-CM | POA: Diagnosis not present

## 2021-09-09 DIAGNOSIS — I6523 Occlusion and stenosis of bilateral carotid arteries: Secondary | ICD-10-CM

## 2021-09-09 MED ORDER — ENTRESTO 49-51 MG PO TABS: 1.0000 | ORAL_TABLET | Freq: Two times a day (BID) | ORAL | 3 refills | Status: DC

## 2021-09-09 MED ORDER — CLOPIDOGREL BISULFATE 75 MG PO TABS: 75.0000 mg | ORAL_TABLET | Freq: Every day | ORAL | 3 refills | Status: DC

## 2021-09-09 NOTE — Progress Notes (Signed)
Follow up visit  Subjective:   Gilbert [name], male    DOB: 10/23/1950, 71 y.o.   MRN: [id number]  HPI   Chief Complaint  Patient presents with   Congestive Heart Failure   Follow-up    3 month    71 y.o. Caucasian male  with CAD s/p MI, LM stenting 2015, ischemic cardiomyopathy with recovery in EF to 55% on guideline directed medical therapy, PAD s/p Left external iliac/common femoral stenting 2015, h/o left nephrectomy for renal cell carcinoma, h/o NHL, currently in remission.  He denies chest pain. He has dyspnea only when he's around his daughter's dog. He has occasional lightheadedness. He complaints that "how do I get fat even when I don't eat much".   Current Outpatient Medications on File Prior to Visit  Medication Sig Dispense Refill   acetaminophen (TYLENOL) 500 MG tablet Take 500 mg by mouth every 6 (six) hours as needed.     aspirin EC 81 MG tablet Take 81 mg by mouth daily. Swallow whole.     atorvastatin (LIPITOR) 80 MG tablet Take 1 tablet by mouth once daily 90 tablet 0   carvedilol (COREG) 12.5 MG tablet Take 1 tablet (12.5 mg total) by mouth 2 (two) times daily with a meal. 180 tablet 3   Cyanocobalamin (B-12) 5000 MCG CAPS Take 5,000 mcg by mouth every morning.      ezetimibe (ZETIA) 10 MG tablet Take 1 tablet by mouth once daily 90 tablet 0   famotidine (PEPCID) 20 MG tablet Take 20 mg by mouth 2 (two) times daily.     furosemide (LASIX) 40 MG tablet TAKE 1/2 (ONE-HALF) TABLET BY MOUTH ONCE DAILY AS NEEDED 45 tablet 6   phenytoin (DILANTIN) 300 MG ER capsule Take 1 capsule by mouth once daily 90 capsule 0   potassium chloride (KLOR-CON) 10 MEQ tablet Take 1 tablet (10 mEq total) by mouth daily. 30 tablet 3   pyridOXINE (VITAMIN B-6) 100 MG tablet Take 100 mg by mouth every morning.      sacubitril-valsartan (ENTRESTO) 49-51 MG Take 1 tablet by mouth 2 (two) times daily. 180 tablet 3   Thiamine HCl (VITAMIN B-1) 250 MG tablet Take 250 mg by mouth every morning.       amLODipine (NORVASC) 10 MG tablet Take 10 mg by mouth daily. (Patient not taking: Reported on 09/09/2021)     Current Facility-Administered Medications on File Prior to Visit  Medication Dose Route Frequency Provider Last Rate Last Admin   sodium chloride flush (NS) 0.9 % injection 10 mL  10 mL Intravenous PRN Josph Macho, MD   10 mL at 05/05/16 1127    Cardiovascular & other pertient studies:  EKG 09/09/2021: Sinus or ectopic rhythm 60 bpm Old anteroseptal infarct  Nonspecific ST depression   Echocardiogram 03/11/2021:  LV endocardial border difficult to visualize. However, LV cavity, wall  thickness, and systolic function is probably normal with LVEF around  50-55%. Grade 1 diastolic function. Normal left atrial pressure.    Mild (Grade I) mitral regurgitation. Mild tricuspid regurgitation. Peak  RA-RV gradient 21 mmHg.  The IVC is not well visualized.  Pericardial fat pad seen. Appearance similar to previous echocardiogram in  2019.  Overall, no significant change noted compared to previous study in 2019.   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 Right common carotid artery and right external carotid artery stenosis of (<50%). Stenosis in the left internal carotid artery (16-49%).  Antegrade right vertebral  artery flow. Antegrade left vertebral artery flow. Compared to the study done on 01/27/2018,  right ICA stenosis is new.  Left ICA stenosis was >50%. F/U in 6 months.  Lexiscan thalium stress test 06/09/2017: 1. Prognostically, this is a high risk study. Resting EKG demonstrates ectopic atrial rhythm and sinus rhythm with LVH with repolarization, cannot exclude lateral ischemia. Stress EKG is nondiagnostic for ischemia as is the pharmacologic stress test. Stress symptoms included dyspnea and dizziness. 2. There is a mild area of ischemia of small size in the septal wall.  There is very large scar of the inferolateral segment with very mild  residual ischemia. 3. Compared to the prior study dated 05/09/2015, the current study now reveals Inferior scar with mild lateral ischemia new. EF has decreaed from 30% to 17%.  Recent labs: 01/28/2021: Glucose 83, BUN/Cr 14/1.41. EGFR 50. Na/K 142/3.3. AlKP 156. Rest of the CMP normal Chol 168, TG 86, HDL 64, LDL 86  01/22/2020: Chol 173, TG 94, HDL 66, LDL 90  07/11/2019: Glucose 86, BUN/Cr 13/1.25. EGFR 59. Na/K 140/3.4. Rest of the CMP normal H/H 12.7/36.1. MCV 87. Platelets 180 Chol 194, TG 136, HDL 59, LDL 111   Review of Systems  Cardiovascular:  Negative for chest pain, dyspnea on exertion, leg swelling, palpitations and syncope.  Neurological:  Positive for dizziness and light-headedness.       Vitals:   09/09/21 1304  BP: (!) 142/84  Pulse: 63  Resp: 16  Temp: (!) 97.2 F (36.2 C)  SpO2: 95%   Orthostatic VS for the past 72 hrs (Last 3 readings):  Orthostatic BP Patient Position BP Location Cuff Size Orthostatic Pulse  09/09/21 1326 151/87 Standing Left Arm Normal 60  09/09/21 1325 (!) 162/98 Sitting Left Arm Normal 55  09/09/21 1324 145/78 Supine Left Arm Normal 55     Body mass index is 26.66 kg/m. Filed Weights   09/09/21 1304  Weight: 185 lb 12.8 oz (84.3 kg)    Objective:   Physical Exam Vitals and nursing note reviewed.  Constitutional:      General: He is not in acute distress. Neck:     Vascular: No JVD.  Cardiovascular:     Rate and Rhythm: Normal rate and regular rhythm.     Pulses:          Dorsalis pedis pulses are 0 on the right side and 0 on the left side.       Posterior tibial pulses are 1+ on the right side and 1+ on the left side.     Heart sounds: Normal heart sounds. No murmur heard. Pulmonary:     Effort: Pulmonary effort is normal.     Breath sounds: Normal breath sounds. No wheezing or rales.  Abdominal:     General: There is distension.  Musculoskeletal:     Right lower leg: No edema.     Left lower leg: No edema.           Assessment & Recommendations:   71 y/o Caucasian male with CAD s/p MI, LM stenting 2015, ischemic cardiomyopathy with recovery in EF to 55% on guideline directed medical therapy, PAD s/p Left external iliac/common femoral stenting 2015, h/o left nephrectomy for renal cell carcinoma, h/o NHL, currently in remission.  Hypertension: Fairly well controlled, no significant orthostatic hypotension today  Ischemic cardiomyopathy: EF recoverred to 50-55% on guiideline directed medical thrapy. Continue coreg 12.5 mg bid Not on spironolactone due to improved NYHA symptoms and gynacomastia from spironolactone. Continue  Entresto 49-51 mg bid  CAD: Stable. No angina symptoms. Continue lipitor 80 mg. Zetia. Not interested in PCSK9 inhibitor.  Intolerant to Aspirin due to GI side effect. Switched to plavix 75 mg daily.   PAD: Stable. Known prior carotid stenosis. Will check surveillance carotid US, especially given his unexplained lightheadedness  Abdominal distention: Will check abdominal US  He looked rather pale to me today. I wanted to check CBC, but he reports his nephrologist yeserday and they were reportedly okay. Will check labs from them  F/u in 6 months  [place], MD [facility] Pager: [phone] Office: [phone]

## 2021-09-15 ENCOUNTER — Ambulatory Visit: Payer: Medicare Other

## 2021-09-15 ENCOUNTER — Other Ambulatory Visit: Payer: Self-pay

## 2021-09-15 DIAGNOSIS — I6523 Occlusion and stenosis of bilateral carotid arteries: Secondary | ICD-10-CM | POA: Diagnosis not present

## 2021-09-22 ENCOUNTER — Other Ambulatory Visit: Payer: Self-pay | Admitting: Student

## 2021-09-22 DIAGNOSIS — I10 Essential (primary) hypertension: Secondary | ICD-10-CM

## 2021-09-29 ENCOUNTER — Other Ambulatory Visit: Payer: Self-pay

## 2021-09-29 DIAGNOSIS — I5022 Chronic systolic (congestive) heart failure: Secondary | ICD-10-CM

## 2021-09-29 MED ORDER — ENTRESTO 49-51 MG PO TABS: 1.0000 | ORAL_TABLET | Freq: Two times a day (BID) | ORAL | 3 refills | Status: DC

## 2021-10-08 ENCOUNTER — Ambulatory Visit (INDEPENDENT_AMBULATORY_CARE_PROVIDER_SITE_OTHER): Payer: Medicare Other | Admitting: Family Medicine

## 2021-10-08 ENCOUNTER — Encounter: Payer: Self-pay | Admitting: Family Medicine

## 2021-10-08 VITALS — BP 130/78 | HR 60 | Temp 97.8°F | Resp 16 | Ht 70.0 in | Wt 181.4 lb

## 2021-10-08 DIAGNOSIS — G40909 Epilepsy, unspecified, not intractable, without status epilepticus: Secondary | ICD-10-CM

## 2021-10-08 DIAGNOSIS — M79671 Pain in right foot: Secondary | ICD-10-CM

## 2021-10-08 DIAGNOSIS — M79672 Pain in left foot: Secondary | ICD-10-CM | POA: Diagnosis not present

## 2021-10-08 DIAGNOSIS — M109 Gout, unspecified: Secondary | ICD-10-CM | POA: Diagnosis not present

## 2021-10-08 MED ORDER — PHENYTOIN SODIUM EXTENDED 300 MG PO CAPS: 300.0000 mg | ORAL_CAPSULE | Freq: Every day | ORAL | 1 refills | Status: DC

## 2021-10-08 MED ORDER — PREDNISONE 20 MG PO TABS: 40.0000 mg | ORAL_TABLET | Freq: Every day | ORAL | 0 refills | Status: DC

## 2021-10-08 NOTE — Patient Instructions (Addendum)
I do recommend meeting with dentist routinely as phenytoin can affect gums/dental health. Health department [place] have recommendations on lower cost care or sometimes dental schools [place] be able to see you at a lower cost. Let me know if I can help.  ?I kept dilantin at same dose for now.  ? ?Foot pain likely due to gout flare.  See information below.  Based on the timing of the symptoms I think that prednisone Mcsweeney be best at this point.  2 pills/day for the next 5 days.  If swelling, pain does not resolve, return for recheck and discuss other options.  If any worsening symptoms be seen. ?We can check a gout test at your next visit but that Waldman not be reliable today during a flare. ? ?Return to the clinic or go to the nearest emergency room if any of your symptoms worsen or new symptoms occur. ? ?

## 2021-10-08 NOTE — Progress Notes (Signed)
Subjective:  Patient ID: Gilbert [name], male    DOB: 01-30-1951  Age: 71 y.o. MRN: [id number]  CC:  Chief Complaint  Patient presents with   Gout    Pt here for gout of both feet for about 1 month, reports was having swelling, redness, and pain    Seizures    Pt has requested a refill medication for seizure disorder last refilled in December of 2021     HPI Gilbert [name] presents for   Seizure disorder: Discussed previously.  Has been ongoing for some time, borderline seizure over 15 years ago, takes Dilantin 300 mg daily.  We have checked his levels in the past but since he had not had recent seizures remain on same dose of medication and recommended dental follow-up given chronic Dilantin use. No recent seizure. No new side effects of dilantin. Denies SI/depression.  Declines dentist eval due to cost.   Lab work noted from nephrology appointment February 6.  Glucose 93.  Creatinine 1.22.  Hemoglobin 13.0.  Lab Results  Component Value Date   WBC 7.9 03/16/2021   HGB 12.9 (L) 03/16/2021   HCT 36.9 (L) 03/16/2021   MCV 87.7 03/16/2021   PLT 162.0 03/16/2021   Lab Results  Component Value Date   ALT 13 01/28/2021   AST 22 01/28/2021   ALKPHOS 156 (H) 01/28/2021   BILITOT 0.5 01/28/2021   Foot pain: Both feet - pain on outside of left foot and at the base of the great toe. Initial symptoms started on left foot about a month ago. Swelling and redness improved on left over past week.  R foot sore past week - few toes, great toe.  No fall/injury.  No current meds for gout. Similar to prior flares of gout. Usually in left foot in past.   Tx: Cold water soaks, canned cherries.     History of CHF,  peripheral artery disease status post left external iliac/common femoral stenting in 2015, prior left nephrectomy, history of non-Hodgkin's lymphoma in remission.  Cardiology appointment February 8, PAD reported stable at that time with planned on surveillance carotid ultrasound.   Last bloodwork few weeks ago at nephrology.  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   Hematuria 10/29/2015   Dysphagia, pharyngoesophageal phase    Early satiety    Esophageal stricture    Gastritis and gastroduodenitis    PAD (peripheral artery disease) ([place]) 05/28/2014   Cardiomyopathy, ischemic 04/03/2014   History of left heart catheterization ([facility]) 02/26/2014   Arterial bleed, intraoperative 02/26/2014   Seizures ([place]) 11/27/2013   Essential hypertension 11/27/2013   Hypomagnesemia 11/27/2013   Coronary artery disease involving native coronary artery of native heart without angina pectoris 11/26/2013   Renal cancer ([place]) 10/19/2013   Coronary artery calcification 09/25/2013   Renal cyst 08/30/2013   Loss of weight 08/03/2013   Past Medical History:  Diagnosis Date   Abnormal liver enzymes    Chronic alkaline phosphatase elevation since 2014   Acid reflux    CAD (coronary artery disease)    Cancer ([place]) 08/2013   right kidney cancer   Cancer ([place])  Coronary artery calcification seen on CAT scan    Coronary atherosclerosis of native coronary artery    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 Used Impella Circulatory assist  device. EF= 20-25%   History of epilepsy    at least 10 years ago. 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 EF 45% 04/10/2014   Therapeutic drug monitoring    Past Surgical History:  Procedure Laterality Date   BALLOON DILATION N/A 01/21/2015   Procedure: BALLOON DILATION;  Surgeon: Gatha Mayer, MD;  Location: [facility];  Service: Endoscopy;  Laterality: N/A;   CARDIAC CATHETERIZATION N/A 06/10/2015   Procedure: Left Heart Cath and Coronary Angiography;  Surgeon: Adrian Prows, MD;  Location: [facility];  Service: Cardiovascular;  Laterality: N/A;   CORONARY ANGIOPLASTY WITH STENT PLACEMENT  08/02/2013   Left main coronary artery stenting on emergent basis along with circulatory support with Impella device through left leg. With 4.0 x 18 mm Xience alpine stent   EAR CYST EXCISION Left 05/31/2018   Procedure: EXCISION LEFT POSTERIOR EAR TUMOR;  Surgeon: Leta Baptist, MD;  Location: [facility];  Service: ENT;  Laterality: Left;   ESOPHAGOGASTRODUODENOSCOPY     ESOPHAGOGASTRODUODENOSCOPY N/A 01/21/2015   Procedure: ESOPHAGOGASTRODUODENOSCOPY (EGD) with possible dilation.;  Surgeon: Gatha Mayer, MD;  Location: [facility];  Service: Endoscopy;  Laterality: N/A;   EYE SURGERY Left 2 weeks ago   torn retina   ILIAC ARTERY STENT Left 05/28/2014   dr Einar Gip   LEFT HEART CATHETERIZATION WITH CORONARY ANGIOGRAM N/A 11/27/2013   Procedure: LEFT HEART CATHETERIZATION WITH CORONARY ANGIOGRAM;  Surgeon: [name], MD;  Location: [facility] CATH LAB;   Service: Cardiovascular;  Laterality: N/A;   LOWER EXTREMITY ANGIOGRAM N/A 02/26/2014   Procedure: LOWER EXTREMITY ANGIOGRAM;  Surgeon: [name], MD;  Location[facility] CATH LAB;  Service: Cardiovascular;  Laterality: N/A;   LOWER EXTREMITY ANGIOGRAM N/A 05/28/2014   Procedure: LOWER EXTREMITY ANGIOGRAM;  Surgeon: [name], MD;  Location[facility] CATH LAB;  Service: Cardiovascular;  Laterality: N/A;   NEPHRECTOMY  11/30/2013   PERCUTANEOUS CORONARY STENT INTERVENTION (PCI-S)  11/27/2013   Procedure: PERCUTANEOUS CORONARY STENT INTERVENTION (PCI-S);  Surgeon: [name], MD;  Location: [facility] CATH LAB;  Service: Cardiovascular;;   ROBOT ASSISTED LAPAROSCOPIC NEPHRECTOMY Right 10/19/2013   Procedure: ROBOTIC ASSISTED LAPAROSCOPIC NEPHRECTOMY,  EXTENSIVE ADHESIOLYSIS;  Surgeon: Alexis Frock, MD;  Location: [facility] ORS;  Service: Urology;  Laterality: Right;   SKIN FULL THICKNESS GRAFT Left 05/31/2018   Procedure: SKIN GRAFT FULL THICKNESS FROM LEFT ABDOMEN  TO LEFT EAR;  Surgeon: Leta Baptist, MD;  Location: [facility];  Service: ENT;  Laterality: Left;   Allergies  Allergen Reactions   Oxycodone Swelling and Other (See Comments)    Tongue and lips swell   Prior to Admission medications   Medication Sig Start Date End Date Taking? Authorizing Provider  acetaminophen (TYLENOL) 500 MG tablet Take 500 mg by mouth every 6 (six) hours as needed.    [provider]  atorvastatin (LIPITOR) 80 MG tablet Take 1 tablet by mouth once daily 09/02/21   Patwardhan, Manish J, MD  carvedilol (COREG) 12.5 MG tablet TAKE 1 TABLET BY MOUTH TWICE DAILY WITH A MEAL 09/22/21   Patwardhan, Manish J, MD  clopidogrel (PLAVIX) 75 MG tablet Take 1 tablet (75 mg total) by mouth daily. 09/09/21   Patwardhan, Reynold Bowen, MD  Cyanocobalamin (B-12) 5000 MCG CAPS Take 5,000 mcg by mouth every morning.     [provider]  ezetimibe (ZETIA) 10 MG tablet Take 1 tablet by mouth once daily 03/02/21   Patwardhan, Manish J,  MD  famotidine (PEPCID) 20 MG tablet Take 20 mg by mouth 2 (two) times daily.    [provider]  furosemide (LASIX) 40 MG tablet TAKE 1/2 (ONE-HALF) TABLET BY MOUTH ONCE DAILY AS NEEDED 07/30/20   Patwardhan, Reynold Bowen, MD  phenytoin (DILANTIN) 300 MG ER capsule Take 1 capsule by mouth once daily 07/07/21   Wendie Agreste, MD  potassium chloride (KLOR-CON) 10 MEQ tablet Take 1 tablet (10 mEq total) by mouth daily. 02/03/21   Wendie Agreste, MD  pyridOXINE (VITAMIN B-6) 100 MG tablet Take 100 mg by mouth every morning.     [provider]  sacubitril-valsartan (ENTRESTO) 49-51 MG Take 1 tablet by mouth 2 (two) times daily. 09/29/21   Patwardhan, Reynold Bowen, MD  Thiamine HCl (VITAMIN B-1) 250 MG tablet Take 250 mg by mouth every morning.     [provider]   Social History   Socioeconomic History   Marital status: Divorced    Spouse name: Not on file   Number of children: 2   Years of education: Not on file   Highest education level: Not on file  Occupational History   Occupation: retired  Tobacco Use   Smoking status: Former    Packs/day: 2.00    Years: 45.00    Pack years: 90.00    Types: Cigarettes    Quit date: 08/03/2003    Years since quitting: 18.1   Smokeless tobacco: Current    Types: Snuff   Tobacco comments:    quit  smoking 10 years agop  Vaping Use   Vaping Use: Never used  Substance and Sexual Activity   Alcohol use: No    Alcohol/week: 0.0 standard drinks    Comment: quit 1980   Drug use: No    Comment: none in past 30 years    Sexual activity: Not Currently  Other Topics Concern   Not on file  Social History Narrative   ** Merged History Encounter **       Social Determinants of Health   Financial Resource Strain: Not on file  Food Insecurity: Not on file  Transportation Needs: Not on file  Physical Activity: Not on file  Stress: Not on file  Social Connections: Not on file  Intimate Partner Violence: Not on file     Review of Systems Per HPI  Objective:   Vitals:   10/08/21 1140  BP: 130/78  Pulse: 60  Resp: 16  Temp: 97.8 F (36.6 C)  TempSrc: Temporal  SpO2: 97%  Weight: 181 lb 6.4 oz (82.3 kg)  Height: '5\' 10"'$  (1.778 m)     Physical Exam Constitutional:      General: He is not in acute distress.    Appearance: Normal appearance. He is well-developed.  HENT:     Head: Normocephalic and atraumatic.  Cardiovascular:     Rate and Rhythm: Normal rate.  Pulmonary:     Effort: Pulmonary effort is normal.  Musculoskeletal:     Comments: Left foot, minimal soft tissue swelling at the first MTP, minimal tenderness to palpation with slight decreased motion of the first MTP.  No other bony tenderness.  Skin intact.  Cap refill less than 2 seconds.  No significant erythema or wounds.  Right foot, slight warmth, swelling, faint erythema surrounding the first MTP, slightly into the base of the second toe/MTP.  Tender to palpation same area.  Guarded motion of first MTP.  Skin intact, no wounds.  Cap refill less than 2 seconds at toes.   Neurological:     Mental Status: He is alert and oriented to person, place, and time.  Psychiatric:        Mood and Affect: Mood normal.        Assessment & Plan:  Gilbert [name] is a 71 y.o. male . Pain in both feet - Plan: predniSONE (DELTASONE) 20 MG tablet Acute gout of foot, unspecified cause, unspecified laterality - Plan: predniSONE (DELTASONE) 20 MG tablet  -Suspected gout flare, now with some persistent symptoms for weeks, left foot has improved, right more affected at this time.  Based on timing of symptoms and underlying kidney disease will start prednisone with potential side effects discussed.  RTC precautions if not improving next week.  Consider uric acid testing at follow-up in the next few months.  Seizure disorder ([place]) - Plan: phenytoin (DILANTIN) 300 MG ER capsule Asymptomatic, no recent seizure, tolerating phenytoin.  Denies any side  effects.  Again recommended ongoing dental care given use of phenytoin.  Routine lab work with nephrology, noted above.  Meds ordered this encounter  Medications   phenytoin (DILANTIN) 300 MG ER capsule    Sig: Take 1 capsule (300 mg total) by mouth daily.    Dispense:  90 capsule    Refill:  1   predniSONE (DELTASONE) 20 MG tablet    Sig: Take 2 tablets (40 mg total) by mouth daily with breakfast.    Dispense:  10 tablet    Refill:  0   Patient Instructions  I do recommend meeting with dentist routinely as phenytoin can affect gums/dental health. Health department [place] have recommendations on lower cost care or sometimes dental schools [place] be able to see you at a lower cost. Let me know if I can help.  I kept dilantin at same dose for now.   Foot pain likely due to gout flare.  See information below.  Based on the timing of the symptoms I think that prednisone [place] be best at this point.  2 pills/day for the next 5 days.  If swelling, pain does not resolve, return for recheck and discuss other options.  If any worsening symptoms be seen. We can check a gout test at your next visit but that [place] not be reliable today during a flare.  Return to the clinic or go to the nearest emergency room if any of your symptoms worsen or new symptoms  occur.     Signed,   [name], MD [facility] 10/08/21 12:17 PM

## 2021-10-17 ENCOUNTER — Other Ambulatory Visit: Payer: Self-pay | Admitting: Cardiology

## 2021-10-17 ENCOUNTER — Other Ambulatory Visit: Payer: Self-pay | Admitting: Family Medicine

## 2021-10-17 DIAGNOSIS — M109 Gout, unspecified: Secondary | ICD-10-CM

## 2021-10-17 DIAGNOSIS — M79671 Pain in right foot: Secondary | ICD-10-CM

## 2021-10-26 ENCOUNTER — Telehealth: Payer: Self-pay | Admitting: Family Medicine

## 2021-10-26 NOTE — Telephone Encounter (Signed)
Left message for patient to call back and schedule Medicare Annual Wellness Visit (AWV) in office.  ° °If not able to come in office, please offer to do virtually or by telephone.  Left office number and my jabber #[phone]. ° °Due for AWVI ° °Please schedule at anytime with Nurse Health Advisor. °  °

## 2021-11-28 ENCOUNTER — Other Ambulatory Visit: Payer: Self-pay | Admitting: Cardiology

## 2021-12-10 ENCOUNTER — Encounter: Payer: Self-pay | Admitting: Cardiology

## 2021-12-10 ENCOUNTER — Encounter: Payer: Self-pay | Admitting: Family Medicine

## 2021-12-10 ENCOUNTER — Ambulatory Visit (INDEPENDENT_AMBULATORY_CARE_PROVIDER_SITE_OTHER): Payer: Medicare Other | Admitting: Family Medicine

## 2021-12-10 ENCOUNTER — Ambulatory Visit: Payer: Medicare Other | Admitting: Cardiology

## 2021-12-10 VITALS — BP 135/70 | HR 72 | Temp 97.8°F | Resp 16 | Ht 70.0 in | Wt 177.6 lb

## 2021-12-10 VITALS — BP 150/88 | HR 55 | Temp 98.1°F | Resp 16 | Ht 70.0 in | Wt 177.0 lb

## 2021-12-10 DIAGNOSIS — I509 Heart failure, unspecified: Secondary | ICD-10-CM | POA: Diagnosis not present

## 2021-12-10 DIAGNOSIS — I5031 Acute diastolic (congestive) heart failure: Secondary | ICD-10-CM

## 2021-12-10 DIAGNOSIS — R0602 Shortness of breath: Secondary | ICD-10-CM | POA: Diagnosis not present

## 2021-12-10 DIAGNOSIS — M79672 Pain in left foot: Secondary | ICD-10-CM

## 2021-12-10 DIAGNOSIS — M79671 Pain in right foot: Secondary | ICD-10-CM | POA: Diagnosis not present

## 2021-12-10 DIAGNOSIS — R0600 Dyspnea, unspecified: Secondary | ICD-10-CM

## 2021-12-10 DIAGNOSIS — I25118 Atherosclerotic heart disease of native coronary artery with other forms of angina pectoris: Secondary | ICD-10-CM | POA: Diagnosis not present

## 2021-12-10 DIAGNOSIS — R0789 Other chest pain: Secondary | ICD-10-CM

## 2021-12-10 LAB — POC COVID19 BINAXNOW: SARS Coronavirus 2 Ag: NEGATIVE

## 2021-12-10 MED ORDER — POTASSIUM CHLORIDE CRYS ER 10 MEQ PO TBCR: 10.0000 meq | EXTENDED_RELEASE_TABLET | Freq: Every day | ORAL | 3 refills | Status: DC

## 2021-12-10 MED ORDER — FUROSEMIDE 40 MG PO TABS: 40.0000 mg | ORAL_TABLET | Freq: Every day | ORAL | 3 refills | Status: DC

## 2021-12-10 NOTE — Progress Notes (Signed)
? ?Follow up visit ? ?Subjective:  ? ?Gilbert [name], male    DOB: Mar 09, 1951, 71 y.o.   MRN: [id number] ? ?HPI ? ? ?Chief Complaint  ?Patient presents with  ? Chest Pain  ? Shortness of Breath  ? ? ?71 y.o. Caucasian male  with CAD s/p MI, LM stenting 2015, ischemic cardiomyopathy with recovery in EF to 55% on guideline directed medical therapy, PAD s/p Left external iliac/common femoral stenting 2015, h/o left nephrectomy for renal cell carcinoma, h/o NHL, currently in remission. ? ?He walked into our office after seen by his PCP who recommended ED visit due to worsening dyspnea, suspicion for heart failure and also chest pain.  He was strongly recommended to go straight to the emergency room.  Patient did not want to go to the ED but presented to our office and requested to be seen ASAP.  States that his chest pain is only present when he coughs and difficulty breathing and orthopnea started over the weekend.  Denies any change in his diet. ? ? ?Current Outpatient Medications:  ?  acetaminophen (TYLENOL) 500 MG tablet, Take 500 mg by mouth every 6 (six) hours as needed., Disp: , Rfl:  ?  atorvastatin (LIPITOR) 80 MG tablet, Take 1 tablet by mouth once daily, Disp: 90 tablet, Rfl: 0 ?  carvedilol (COREG) 12.5 MG tablet, TAKE 1 TABLET BY MOUTH TWICE DAILY WITH A MEAL, Disp: 180 tablet, Rfl: 3 ?  clopidogrel (PLAVIX) 75 MG tablet, Take 1 tablet (75 mg total) by mouth daily., Disp: 90 tablet, Rfl: 3 ?  Cyanocobalamin (B-12) 5000 MCG CAPS, Take 5,000 mcg by mouth every morning. , Disp: , Rfl:  ?  ezetimibe (ZETIA) 10 MG tablet, Take 1 tablet by mouth once daily, Disp: 90 tablet, Rfl: 0 ?  famotidine (PEPCID) 20 MG tablet, Take 20 mg by mouth 2 (two) times daily., Disp: , Rfl:  ?  phenytoin (DILANTIN) 300 MG ER capsule, Take 1 capsule (300 mg total) by mouth daily., Disp: 90 capsule, Rfl: 1 ?  predniSONE (DELTASONE) 20 MG tablet, Take 2 tablets (40 mg total) by mouth daily with breakfast., Disp: 10 tablet, Rfl: 0 ?   pyridOXINE (VITAMIN B-6) 100 MG tablet, Take 100 mg by mouth every morning. , Disp: , Rfl:  ?  sacubitril-valsartan (ENTRESTO) 49-51 MG, Take 1 tablet by mouth 2 (two) times daily., Disp: 180 tablet, Rfl: 3 ?  Thiamine HCl (VITAMIN B-1) 250 MG tablet, Take 250 mg by mouth every morning. , Disp: , Rfl:  ?  furosemide (LASIX) 40 MG tablet, Take 1 tablet (40 mg total) by mouth daily. Take one tab twice daily for 3 days, Disp: 90 tablet, Rfl: 3 ?  potassium chloride (KLOR-CON M) 10 MEQ tablet, Take 1 tablet (10 mEq total) by mouth daily., Disp: 90 tablet, Rfl: 3  ? ? ?Cardiovascular & other pertient studies: ? ?EKG  ? ?EKG 12/10/2021: Normal sinus rhythm at rate of 70 bpm with frequent ectopic atrial beats, single PVC.  Nonspecific ST abnormality, cannot exclude high lateral ischemia.  No significant change from 09/09/2021. ? ?Echocardiogram 03/11/2021:  ?LV endocardial border difficult to visualize. However, LV cavity, wall  ?thickness, and systolic function is probably normal with LVEF around  ?50-55%. Grade 1 diastolic function. Normal left atrial pressure.    ?Mild (Grade I) mitral regurgitation. Mild tricuspid regurgitation. Peak  ?RA-RV gradient 21 mmHg.  ?The IVC is not well visualized.  ?Pericardial fat pad seen. Appearance similar to previous echocardiogram in  ?2019.  ?  Overall, no significant change noted compared to previous study in 2019.  ? ?Carotid artery duplex 08/10/2018: ?Stenosis in the right ICA of 50-69%. Right common carotid artery and right external carotid artery stenosis of (<50%). ?Stenosis in the left internal carotid artery (16-49%).  ?Antegrade right vertebral artery flow. Antegrade left vertebral artery flow. ?Compared to the study done on 01/27/2018,  right ICA stenosis is new.  Left ICA stenosis was >50%. F/U in 6 months. ? ?Lexiscan thalium stress test 06/09/2017: ?1. Prognostically, this is a high risk study. Resting EKG demonstrates ectopic atrial rhythm and sinus rhythm with LVH with  repolarization, cannot exclude lateral ischemia. Stress EKG is nondiagnostic for ischemia as is the pharmacologic stress test. Stress symptoms included dyspnea and dizziness. ?2. There is a mild area of ischemia of small size in the septal wall.  There is very large scar of the inferolateral segment with very mild residual ischemia. ?3. Compared to the prior study dated 05/09/2015, the current study now reveals Inferior scar with mild lateral ischemia new. EF has decreaed from 30% to 17%. ? ?Recent labs: ?01/28/2021: ?Glucose 83, BUN/Cr 14/1.41. EGFR 50. Na/K 142/3.3. AlKP 156. Rest of the CMP normal ?Chol 168, TG 86, HDL 64, LDL 86 ? ?01/22/2020: ?Chol 173, TG 94, HDL 66, LDL 90 ? ?07/11/2019: ?Glucose 86, BUN/Cr 13/1.25. EGFR 59. Na/K 140/3.4. Rest of the CMP normal ?H/H 12.7/36.1. MCV 87. Platelets 180 ?Chol 194, TG 136, HDL 59, LDL 111 ? ? ?Review of Systems  ?Cardiovascular:  Positive for dyspnea on exertion and orthopnea. Negative for chest pain, leg swelling, palpitations and syncope.  ?Neurological:  Positive for dizziness and light-headedness.  ? ?   ? ?Vitals:  ? 12/10/21 1500  ?BP: 135/70  ?Pulse: 72  ?Resp: 16  ?Temp: 97.8 ?F (36.6 ?C)  ?SpO2: 96%  ? ?Orthostatic VS for the past 72 hrs (Last 3 readings): ? Patient Position BP Location Cuff Size  ?12/10/21 1500 Sitting Left Arm Normal  ? ? ? ?Body mass index is 25.48 kg/m?. ?Filed Weights  ? 12/10/21 1500  ?Weight: 177 lb 9.6 oz (80.6 kg)  ? ? ?Objective:  ? Physical Exam ?Vitals reviewed.  ?Neck:  ?   Vascular: No JVD.  ?Cardiovascular:  ?   Rate and Rhythm: Normal rate and regular rhythm.  ?   Pulses:     ?     Dorsalis pedis pulses are 0 on the right side and 0 on the left side.  ?     Posterior tibial pulses are 1+ on the right side and 1+ on the left side.  ?   Heart sounds: Normal heart sounds. No murmur heard. ?Pulmonary:  ?   Effort: Pulmonary effort is normal.  ?   Breath sounds: Examination of the right-lower field reveals wheezing and rales.  Examination of the left-lower field reveals wheezing. Wheezing and rales present.  ?Abdominal:  ?   General: Bowel sounds are normal.  ?   Palpations: Abdomen is soft.  ?Musculoskeletal:  ?   Right lower leg: No edema.  ?   Left lower leg: No edema.  ? ?   ?Assessment & Recommendations:  ? ? ?  ICD-10-CM   ?1. Acute diastolic heart failure (HCC)  Q74.31 furosemide (LASIX) 40 MG tablet  ?  potassium chloride (KLOR-CON M) 10 MEQ tablet  ?  Pro b natriuretic peptide (BNP)  ?  Basic metabolic panel  ?  PCV ECHOCARDIOGRAM COMPLETE  ?  ?2. Shortness of breath  R06.02 EKG 12-Lead  ?  ?3. Coronary artery disease of native artery of native heart with stable angina pectoris (HCC)  I25.118 PCV ECHOCARDIOGRAM COMPLETE  ?  ? ? ?71 y.o. y/o Caucasian male with CAD s/p MI, LM stenting 2015, ischemic cardiomyopathy with recovery in EF to 55% on guideline directed medical therapy, PAD s/p Left external iliac/common femoral stenting 2015, h/o left nephrectomy for renal cell carcinoma, h/o NHL, currently in remission. ? ?He walked into our office after seen by his PCP who recommended ED visit due to worsening dyspnea, suspicion for heart failure and also chest pain.  Patient did not want to go to the ED.  States that his chest pain is only present when he coughs and difficulty breathing and orthopnea started over the weekend.  Denies any change in his diet. ? ?He is presently in acute diastolic heart failure.  I will obtain a BMP and BNP today, he is presently on 20 mg of Lasix daily, will increase it to 40 mg twice daily for 3 days followed by 40 mg daily and and also Rx potassium supplements with that.  I would like to see him back in 2 weeks for follow-up of heart failure.  Do not suspect unstable angina, his EKG does not reveal any changes from prior EKG.  He has ectopic atrial rhythm with short PR interval and chronic high lateral horizontal ST depression of 1 mm that is unchanged.  Chest pain also does not appear to suggest  angina pectoris. ? ?He Arno also have COPD exacerbation as well.  Advised him that if he does not feel well, he should certainly go to the emergency room.  Clear-cut instructions were given, office visit will be scheduled

## 2021-12-10 NOTE — Patient Instructions (Addendum)
Go directly to emergency room today for further evaluation of your shortness of breath and chest symptoms.  ? ?

## 2021-12-10 NOTE — Progress Notes (Signed)
? ?Subjective:  ?Patient ID: Gilbert [name], male    DOB: 01/28/1951  Age: 71 y.o. MRN: [id number] ? ?CC:  ?Chief Complaint  ?Patient presents with  ? Gout  ?  Pt reports pain in feet is better   ? Shortness of Breath  ?  Notes some shortness of breath since Sunday, chest tightness states feels congested in his chest. No other sxs noted   ? ? ?HPI ?Gilbert [name] presents for  ? ?Bilateral foot pain ?With history of gout, discussed in March.  Surrounding redness was improving.  Treating with canned cherries, cold water soaks at that time.  With some persistent symptoms was treated with prednisone at that time.  Symptoms improved.  Feeling better now. ? ?Chest tightness with dyspnea: ?Started 5 days ago with chest tightness, congestion, coughing up clear phlegm. No fever. No sick contacts. Min runny nose. Continued short of breath. Lightheaded after walking 5 steps due to shortness of breath. Alka selzer plus. - no relief. Getting worse each day.  ?No HA/bodyache. Lightheaded.  ?No arm pain. Chest feels tight, no diaphoresis. No N/V.  ? ?No leg swelling.with some shortness of breath, chest tightness and congestion. ? ?History of chronic systolic heart failure peripheral artery disease status post stenting in 2015, prior left nephrectomy, non-Hodgkin's lymphoma in remission.  Last visit with cardiology February 8.  Prior ischemic cardiomyopathy with recovery in EF to 55% on medical therapy.  EKG 09/09/2021 with sinus or ectopic rhythm with 60 bpm, old septal infarct and nonspecific ST depression.  Echo 03/11/2021 with EF around 50 to 55%.  No significant change compared to previous study in 2019. ? ?Lab Results  ?Component Value Date  ? WBC 7.9 03/16/2021  ? HGB 12.9 (L) 03/16/2021  ? HCT 36.9 (L) 03/16/2021  ? MCV 87.7 03/16/2021  ? PLT 162.0 03/16/2021  ? ? ?History ?Patient Active Problem List  ? Diagnosis Date Noted  ? Mixed hyperlipidemia 12/24/2019  ? Chronic systolic heart failure ([place]) 03/23/2019  ? CHF (congestive  heart failure) ([place]) 06/01/2017  ? AKI (acute kidney injury) ([place]) 04/07/2017  ? Orthostatic hypotension 04/07/2017  ? Diffuse non-Hodgkin's lymphoma of bone ([place]) 01/08/2016  ? 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  ? Hematuria 10/29/2015  ? Dysphagia, pharyngoesophageal phase   ? Early satiety   ? Esophageal stricture   ? Gastritis and gastroduodenitis   ? PAD (peripheral artery disease) ([place]) 05/28/2014  ? Cardiomyopathy, ischemic 04/03/2014  ? History of left heart catheterization ([facility]) 02/26/2014  ? Arterial bleed, intraoperative 02/26/2014  ? Seizures ([place]) 11/27/2013  ? Essential hypertension 11/27/2013  ? Hypomagnesemia 11/27/2013  ? Coronary artery disease involving native coronary artery of native heart without angina pectoris 11/26/2013  ? Renal cancer ([place]) 10/19/2013  ? Coronary artery calcification 09/25/2013  ? Renal cyst 08/30/2013  ? Loss of weight 08/03/2013  ? ?Past Medical History:  ?Diagnosis Date  ? Abnormal liver enzymes   ? Chronic alkaline phosphatase elevation since 2014  ? Acid reflux   ? CAD (coronary artery disease)   ? Cancer ([place]) 08/2013  ? right kidney cancer  ? Cancer [facility])   ? Coronary artery calcification seen on CAT scan   ? Coronary atherosclerosis of native coronary artery   ? 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 Essential hypertension, benign   ? Family history of anesthesia complication   ? mother-had stroke during anesthesia  ?  GERD (gastroesophageal reflux disease)   ? Gout   ? History of cardiac arrest 11/26/2013  ? PTCA/Stenting of LM & Prox LAD with 4.0 x 18 mm Xience alpine stent. Used Impella Circulatory assist device. EF= 20-25%  ? History of epilepsy   ? at least 10 years ago. Grandmal seizures since childhood  ? History of myocardial infarction less than 8 weeks   ? Stent placed  ? History of nephrectomy 11/2013  ? For  renal cell carcinoma  ? History of tobacco use   ? HTN (hypertension)   ? Hypercholesteremia   ? Hyperlipemia   ? Hypertension   ? PAD (peripheral artery disease) ([place])   ? Seizures ([place])   ? LAST SZ 15-20 YEARS AGO 03-30-2021  ? Shortness of breath   ? with activity  ? SOB (shortness of breath)   ? Systolic and diastolic CHF, chronic ([place])   ? Resolved. EF 45% 04/10/2014  ? Therapeutic drug monitoring   ? ?Past Surgical History:  ?Procedure Laterality Date  ? BALLOON DILATION N/A 01/21/2015  ? Procedure: BALLOON DILATION;  Surgeon: Gatha Mayer, MD;  Location[facility] ENDOSCOPY;  Service: Endoscopy;  Laterality: N/A;  ? CARDIAC CATHETERIZATION N/A 06/10/2015  ? Procedure: Left Heart Cath and Coronary Angiography;  Surgeon: Adrian Prows, MD;  Location: [place] CV LAB;  Service: Cardiovascular;  Laterality: N/A;  ? CORONARY ANGIOPLASTY WITH STENT PLACEMENT  08/02/2013  ? Left main coronary artery stenting on emergent basis along with circulatory support with Impella device through left leg. With 4.0 x 18 mm Xience alpine stent  ? EAR CYST EXCISION Left 05/31/2018  ? Procedure: EXCISION LEFT POSTERIOR EAR TUMOR;  Surgeon: Leta Baptist, MD;  [facility];  Service: ENT;  Laterality: Left;  ? ESOPHAGOGASTRODUODENOSCOPY    ? ESOPHAGOGASTRODUODENOSCOPY N/A 01/21/2015  ? Procedure: ESOPHAGOGASTRODUODENOSCOPY (EGD) with possible dilation.;  Surgeon: Gatha Mayer, MD;  Location: [facility] ENDOSCOPY;  Service: Endoscopy;  Laterality: N/A;  ? EYE SURGERY Left 2 weeks ago  ? torn retina  ? ILIAC ARTERY STENT Left 05/28/2014  ? dr Einar Gip  ? LEFT HEART CATHETERIZATION WITH CORONARY ANGIOGRAM N/A 11/27/2013  ? Procedure: LEFT HEART CATHETERIZATION WITH CORONARY ANGIOGRAM;  Surgeon: [name], MD;  Location: [facility] CATH LAB;  Service: Cardiovascular;  Laterality: N/A;  ? LOWER EXTREMITY ANGIOGRAM N/A 02/26/2014  ? Procedure: LOWER EXTREMITY ANGIOGRAM;  Surgeon: [name], MD;  Location: [facility] CATH LAB;  Service: Cardiovascular;   Laterality: N/A;  ? LOWER EXTREMITY ANGIOGRAM N/A 05/28/2014  ? Procedure: LOWER EXTREMITY ANGIOGRAM;  Surgeon: [name], MD;  Location: [facility] CATH LAB;  Service: Cardiovascular;  Laterality: N/A;  ? NEPHRECTOMY  11/30/2013  ? PERCUTANEOUS CORONARY STENT INTERVENTION (PCI-S)  11/27/2013  ? Procedure: PERCUTANEOUS CORONARY STENT INTERVENTION (PCI-S);  Surgeon: [name], MD;  Location: [facility] CATH LAB;  Service: Cardiovascular;;  ? ROBOT ASSISTED LAPAROSCOPIC NEPHRECTOMY Right 10/19/2013  ? Procedure: ROBOTIC ASSISTED LAPAROSCOPIC NEPHRECTOMY,  EXTENSIVE ADHESIOLYSIS;  Surgeon: Alexis Frock, MD;  Location: [facility] ORS;  Service: Urology;  Laterality: Right;  ? SKIN FULL THICKNESS GRAFT Left 05/31/2018  ? Procedure: SKIN GRAFT FULL THICKNESS FROM LEFT ABDOMEN TO LEFT EAR;  Surgeon: Leta Baptist, MD;  Location: [place];  Service: ENT;  Laterality: Left;  ? ?Allergies  ?Allergen Reactions  ? Oxycodone Swelling and Other (See Comments)  ?  Tongue and lips swell  ? ?Prior to Admission medications   ?Medication Sig Start Date End Date Taking? Authorizing Provider  ?  acetaminophen (TYLENOL) 500 MG tablet Take 500 mg by mouth every 6 (six) hours as needed.   Yes [provider]  ?atorvastatin (LIPITOR) 80 MG tablet Take 1 tablet by mouth once daily 11/30/21  Yes Patwardhan, Manish J, MD  ?carvedilol (COREG) 12.5 MG tablet TAKE 1 TABLET BY MOUTH TWICE DAILY WITH A MEAL 09/22/21  Yes Patwardhan, Manish J, MD  ?clopidogrel (PLAVIX) 75 MG tablet Take 1 tablet (75 mg total) by mouth daily. 09/09/21  Yes Patwardhan, Manish J, MD  ?Cyanocobalamin (B-12) 5000 MCG CAPS Take 5,000 mcg by mouth every morning.    Yes [provider]  ?ezetimibe (ZETIA) 10 MG tablet Take 1 tablet by mouth once daily 03/02/21  Yes Patwardhan, Manish J, MD  ?famotidine (PEPCID) 20 MG tablet Take 20 mg by mouth 2 (two) times daily.   Yes [provider]  ?furosemide (LASIX) 40 MG tablet TAKE 1/2 (ONE-HALF) TABLET BY MOUTH ONCE DAILY AS  NEEDED 10/19/21  Yes Patwardhan, Manish J, MD  ?phenytoin (DILANTIN) 300 MG ER capsule Take 1 capsule (300 mg total) by mouth daily. 10/08/21  Yes Wendie Agreste, MD  ?potassium chloride (KLOR-CON) 10 MEQ tablet

## 2021-12-11 LAB — BASIC METABOLIC PANEL
BUN/Creatinine Ratio: 11 (ref 10–24)
BUN: 15 mg/dL (ref 8–27)
CO2: 27 mmol/L (ref 20–29)
Calcium: 9 mg/dL (ref 8.6–10.2)
Chloride: 98 mmol/L (ref 96–106)
Creatinine, Ser: 1.35 mg/dL — ABNORMAL HIGH (ref 0.76–1.27)
Glucose: 103 mg/dL — ABNORMAL HIGH (ref 70–99)
Potassium: 3.7 mmol/L (ref 3.5–5.2)
Sodium: 142 mmol/L (ref 134–144)
eGFR: 56 mL/min/{1.73_m2} — ABNORMAL LOW (ref >59)

## 2021-12-11 LAB — PRO B NATRIURETIC PEPTIDE: NT-Pro BNP: 16709 pg/mL — ABNORMAL HIGH (ref 0–376)

## 2021-12-21 ENCOUNTER — Encounter: Payer: Self-pay | Admitting: Family Medicine

## 2021-12-21 ENCOUNTER — Ambulatory Visit (INDEPENDENT_AMBULATORY_CARE_PROVIDER_SITE_OTHER): Payer: Medicare Other | Admitting: Family Medicine

## 2021-12-21 VITALS — BP 128/66 | HR 73 | Temp 98.1°F | Resp 16 | Ht 70.0 in | Wt 167.6 lb

## 2021-12-21 DIAGNOSIS — R0600 Dyspnea, unspecified: Secondary | ICD-10-CM | POA: Diagnosis not present

## 2021-12-21 DIAGNOSIS — Z0000 Encounter for general adult medical examination without abnormal findings: Secondary | ICD-10-CM

## 2021-12-21 DIAGNOSIS — C8330 Diffuse large B-cell lymphoma, unspecified site: Secondary | ICD-10-CM | POA: Diagnosis not present

## 2021-12-21 DIAGNOSIS — I10 Essential (primary) hypertension: Secondary | ICD-10-CM

## 2021-12-21 DIAGNOSIS — I509 Heart failure, unspecified: Secondary | ICD-10-CM | POA: Diagnosis not present

## 2021-12-21 DIAGNOSIS — Z23 Encounter for immunization: Secondary | ICD-10-CM

## 2021-12-21 DIAGNOSIS — E785 Hyperlipidemia, unspecified: Secondary | ICD-10-CM | POA: Diagnosis not present

## 2021-12-21 NOTE — Progress Notes (Unsigned)
Subjective:  Patient ID: Gilbert [name], male    DOB: 1950/11/28  Age: 71 y.o. MRN: [id number]  CC:  Chief Complaint  Patient presents with   Annual Exam    Pt here for check in no concerns     HPI Gilbert [name] presents for Annual Exam And follow-up of last visit  Care team: PCP: me Cardiology: Nelva NayTonita Cong Nephrology : Joelyn Oms GICarlean Purl Hematology: Marin Olp   Chest tightness Symptoms for 5 days were discussed [place] 11.  History of chronic systolic heart failure, PAD, nephrectomy and non-Hodgkin's lymphoma in remission.  He had been experiencing 5 days of chest tightness, congestion, cough, lightheadedness.  ER evaluation was recommended, concerning for acute CHF flare.  COVID testing was negative.  He agreed to proceed to the ER after our visit by private transport. On chart review he did not proceed to ER.  He went to his cardiologist office that day and walked in to be seen.  Per cardiology note chest pain only present with cough and difficulty breathing and orthopnea has started over the weekend.  Diagnosed as acute diastolic heart failure.  Lasix was increased from 20 mg daily to 40 mg twice daily for 3 days followed by 40 mg daily and prescription potassium supplement.  Plan for 2-week follow-up.  Did not suspect unstable angina as EKG did not reveal any changes from prior EKG.  Ectopic atrial rhythm with short PR and chronic high lateral horizontal ST depression that was unchanged.  Also possible COPD exacerbation.  ER precautions were given. proBNP was 16,709 on [place] 11.  Creatinine 1.35 with normal potassium.  Creatinine overall stable from previous readings.  Since cardiology visit, feeling better. Less short of breath. Breathing better. No chest pain, no fever. Some cough - but better.  Taking furosemide '40mg'$  BID, then down to QD dosing now. Still taking potassium 4mg qd.  Wt Readings from Last 3 Encounters:  12/21/21 167 lb 9.6 oz (76 kg)  12/10/21 177 lb 9.6 oz  (80.6 kg)  12/10/21 177 lb (80.3 kg)          12/10/2021   11:33 AM 10/08/2021   11:43 AM 06/20/2020   10:05 AM 05/04/2019    1:55 PM 10/30/2018    8:47 AM  Depression screen PHQ 2/9  Decreased Interest 0 1 0 0 0  Down, Depressed, Hopeless 0 0 0 0 0  PHQ - 2 Score 0 1 0 0 0    Health Maintenance  Topic Date Due   COVID-19 Vaccine (3 - Pfizer risk series) 12/26/2021 (Originally 11/14/2019)   Zoster Vaccines- Shingrix (1 of 2) 01/08/2022 (Originally 10/21/1969)   Pneumonia Vaccine 71 Years old (3) 12/11/2022 (Originally 01/23/2020)   TETANUS/TDAP  12/11/2022 (Originally 10/21/1969)   Hepatitis C Screening  Completed   HPV VACCINES  Aged Out   INFLUENZA VACCINE  Discontinued   COLONOSCOPY (Pts 45-473yrInsurance coverage will need to be confirmed)  Discontinued   Fecal DNA (Cologuard)  Discontinued     Immunization History  Administered Date(s) Administered   Influenza, High Dose Seasonal PF 05/26/2017   PFIZER(Purple Top)SARS-COV-2 Vaccination 09/22/2019, 10/17/2019   Pneumococcal Conjugate-13 05/26/2017   Pneumococcal Polysaccharide-23 01/23/2015  [facility]   No results found. Optho - overdue. Recommended appt.   Dental: no. Natural teeth.   Alcohol: none  Tobacco: dip daily, no cigarettes.  Exercise: minimal, golf prior. Limited lately with leg and lung issues. Seen  by ortho for leg with min relief after shot. Recommended follow up with ortho.   Last hematology appt in 2020.  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   Hematuria 10/29/2015   Dysphagia, pharyngoesophageal phase    Early satiety    Esophageal stricture    Gastritis and gastroduodenitis    PAD (peripheral artery disease) ([place]) 05/28/2014   Cardiomyopathy, ischemic 04/03/2014   History of left heart catheterization ([facility]) 02/26/2014   Arterial bleed, intraoperative 02/26/2014   Seizures ([place]) 11/27/2013   Essential hypertension 11/27/2013   Hypomagnesemia 11/27/2013   Coronary artery disease involving native coronary artery of native heart without angina pectoris 11/26/2013   Renal cancer ([place]) 10/19/2013   Coronary artery calcification 09/25/2013   Renal cyst 08/30/2013   Loss of weight 08/03/2013   Past Medical History:  Diagnosis Date   Abnormal liver enzymes    Chronic alkaline phosphatase elevation since 2014   Acid reflux    CAD (coronary artery disease)    Cancer ([place]) 08/2013   right kidney cancer   Cancer ([place])    Coronary artery calcification seen on CAT scan    Coronary atherosclerosis of native coronary artery    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 Used Impella Circulatory assist device. EF= 20-25%   History of epilepsy    at least 10 years ago. 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 EF 45% 04/10/2014   Therapeutic drug monitoring    Past Surgical History:  Procedure Laterality Date   BALLOON DILATION N/A 01/21/2015   Procedure: BALLOON DILATION;  Surgeon: Gatha Mayer, MD;  Location: [place];  Service: Endoscopy;  Laterality: N/A;   CARDIAC CATHETERIZATION N/A 06/10/2015   Procedure: Left Heart Cath and Coronary Angiography;  Surgeon: Adrian Prows, MD;  Location: [place];  Service: Cardiovascular;  Laterality: N/A;   CORONARY ANGIOPLASTY WITH STENT PLACEMENT  08/02/2013   Left main coronary artery stenting on emergent basis along with circulatory support  with Impella device through left leg. With 4.0 x 18 mm Xience alpine stent   EAR CYST EXCISION Left 05/31/2018   Procedure: EXCISION LEFT POSTERIOR EAR TUMOR;  Surgeon: Leta Baptist, MD;  Location: [facility];  Service: ENT;  Laterality: Left;   ESOPHAGOGASTRODUODENOSCOPY     ESOPHAGOGASTRODUODENOSCOPY N/A 01/21/2015   Procedure: ESOPHAGOGASTRODUODENOSCOPY (EGD) with possible dilation.;  Surgeon: Gatha Mayer, MD;  Location: [place];  Service: Endoscopy;  Laterality: N/A;   EYE SURGERY Left 2 weeks ago   torn retina   ILIAC ARTERY STENT Left 05/28/2014   dr Einar Gip   LEFT HEART CATHETERIZATION WITH CORONARY ANGIOGRAM N/A 11/27/2013   Procedure: LEFT HEART CATHETERIZATION WITH CORONARY ANGIOGRAM;  Surgeon: [name], MD;  Location: [facility] CATH LAB;  Service: Cardiovascular;  Laterality: N/A;   LOWER EXTREMITY ANGIOGRAM N/A 02/26/2014   Procedure: LOWER EXTREMITY ANGIOGRAM;  Surgeon: [name], MD;  Location: [facility] CATH LAB;  Service: Cardiovascular;  Laterality: N/A;   LOWER EXTREMITY ANGIOGRAM N/A 05/28/2014   Procedure: LOWER EXTREMITY  ANGIOGRAM;  Surgeon: [name], MD;  Location: [facility] CATH LAB;  Service: Cardiovascular;  Laterality: N/A;   NEPHRECTOMY  11/30/2013   PERCUTANEOUS CORONARY STENT INTERVENTION (PCI-S)  11/27/2013   Procedure: PERCUTANEOUS CORONARY STENT INTERVENTION (PCI-S);  Surgeon: [name], MD;  Location: [facility] CATH LAB;  Service: Cardiovascular;;   ROBOT ASSISTED LAPAROSCOPIC NEPHRECTOMY Right 10/19/2013   Procedure: ROBOTIC ASSISTED LAPAROSCOPIC NEPHRECTOMY,  EXTENSIVE ADHESIOLYSIS;  Surgeon: Alexis Frock, MD;  Location: [facility] OR[facility];  Service: Urology;  Laterality: Right;   SKIN FULL THICKNESS GRAFT Left 05/31/2018   Procedure: SKIN GRAFT FULL THICKNESS FROM LEFT ABDOMEN TO LEFT EAR;  Surgeon: Leta Baptist, MD;  Location: [place];  Service: ENT;  Laterality: Left;   Allergies  Allergen Reactions   Oxycodone Swelling and Other (See Comments)    Tongue and lips swell   Prior to Admission medications   Medication Sig Start Date End Date Taking? Authorizing Provider  acetaminophen (TYLENOL) 500 MG tablet Take 500 mg by mouth every 6 (six) hours as needed.    [provider]  atorvastatin (LIPITOR) 80 MG tablet Take 1 tablet by mouth once daily 11/30/21   Patwardhan, Manish J, MD  carvedilol (COREG) 12.5 MG tablet TAKE 1 TABLET BY MOUTH TWICE DAILY WITH A MEAL 09/22/21   Patwardhan, Manish J, MD  clopidogrel (PLAVIX) 75 MG tablet Take 1 tablet (75 mg total) by mouth daily. 09/09/21   Patwardhan, Reynold Bowen, MD  Cyanocobalamin (B-12) 5000 MCG CAPS Take 5,000 mcg by mouth every morning.     [provider]  ezetimibe (ZETIA) 10 MG tablet Take 1 tablet by mouth once daily 03/02/21   Patwardhan, Manish J, MD  famotidine (PEPCID) 20 MG tablet Take 20 mg by mouth 2 (two) times daily.    [provider]  furosemide (LASIX) 40 MG tablet Take 1 tablet (40 mg total) by mouth daily. Take one tab twice daily for 3 days 12/10/21   Adrian Prows, MD  phenytoin (DILANTIN) 300 MG ER capsule Take 1 capsule  (300 mg total) by mouth daily. 10/08/21   Wendie Agreste, MD  potassium chloride (KLOR-CON M) 10 MEQ tablet Take 1 tablet (10 mEq total) by mouth daily. 12/10/21   Adrian Prows, MD  predniSONE (DELTASONE) 20 MG tablet Take 2 tablets (40 mg total) by mouth daily with breakfast. 10/08/21   Wendie Agreste, MD  pyridOXINE (VITAMIN B-6) 100 MG tablet Take 100 mg  by mouth every morning.     [provider]  sacubitril-valsartan (ENTRESTO) 49-51 MG Take 1 tablet by mouth 2 (two) times daily. 09/29/21   Patwardhan, Reynold Bowen, MD  Thiamine HCl (VITAMIN B-1) 250 MG tablet Take 250 mg by mouth every morning.     [provider]   Social History   Socioeconomic History   Marital status: Divorced    Spouse name: Not on file   Number of children: 2   Years of education: Not on file   Highest education level: Not on file  Occupational History   Occupation: retired  Tobacco Use   Smoking status: Former    Packs/day: 2.00    Years: 45.00    Pack years: 90.00    Types: Cigarettes    Quit date: 08/03/2003    Years since quitting: 18.3   Smokeless tobacco: Current    Types: Snuff   Tobacco comments:    quit  smoking 10 years agop  Vaping Use   Vaping Use: Never used  Substance and Sexual Activity   Alcohol use: No    Alcohol/week: 0.0 standard drinks    Comment: quit 1980   Drug use: No    Comment: none in past 30 years    Sexual activity: Not Currently  Other Topics Concern   Not on file  Social History Narrative   ** Merged History Encounter **       Social Determinants of Health   Financial Resource Strain: Not on file  Food Insecurity: Not on file  Transportation Needs: Not on file  Physical Activity: Not on file  Stress: Not on file  Social Connections: Not on file  Intimate Partner Violence: Not on file    Review of Systems 13 point review of systems per patient health survey noted.  Negative other than as indicated above or in HPI.   Objective:   Vitals:    12/21/21 1403  BP: 128/66  Pulse: 73  Resp: 16  Temp: 98.1 F (36.7 C)  TempSrc: Temporal  SpO2: 97%  Weight: 167 lb 9.6 oz (76 kg)  Height: '5\' 10"'$  (1.778 m)   {Vitals History (Optional):23777}  Physical Exam Vitals reviewed.  Constitutional:      General: He is not in acute distress.    Appearance: Normal appearance. He is well-developed. He is not ill-appearing, toxic-appearing or diaphoretic.  HENT:     Head: Normocephalic and atraumatic.     Right Ear: External ear normal.     Left Ear: External ear normal.  Eyes:     Conjunctiva/sclera: Conjunctivae normal.     Pupils: Pupils are equal, round, and reactive to light.  Neck:     Thyroid: No thyromegaly.  Cardiovascular:     Rate and Rhythm: Normal rate and regular rhythm.     Heart sounds: Normal heart sounds.  Pulmonary:     Effort: Pulmonary effort is normal. No respiratory distress.     Breath sounds: Normal breath sounds. No stridor. No wheezing, rhonchi or rales.  Abdominal:     General: There is no distension.     Palpations: Abdomen is soft.     Tenderness: There is no abdominal tenderness.  Musculoskeletal:     Cervical back: Normal range of motion and neck supple.     Right lower leg: No edema.     Left lower leg: No edema.  Lymphadenopathy:     Cervical: No cervical adenopathy.  Skin:    General: Skin is  warm and dry.  Neurological:     Mental Status: He is alert and oriented to person, place, and time.     Deep Tendon Reflexes: Reflexes are normal and symmetric.  Psychiatric:        Behavior: Behavior normal.       Assessment & Plan:  Gilbert [name] is a 71 y.o. male . Congestive heart failure, unspecified HF chronicity, unspecified heart failure type ([place]) - Plan: Comprehensive metabolic panel Dyspnea, unspecified type  -Symptomatically improved after treatment recommended by cardiology.  Appears euvolemic.  Lungs clear. Weight down 10 pounds.   Continue same dose diuretic for now with updated  labs ordered.  RTC/ER/cardiology follow-up precautions were discussed.  Annual physical exam  - -anticipatory guidance as below in AVS, screening labs above. Health maintenance items as above in HPI discussed/recommended as applicable.   Recommended decreased use, or quitting chewing tobacco.  Also recommended Optho, dental follow-up.  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  Hyperlipidemia, unspecified hyperlipidemia type - Plan: Lipid panel  -Check lipids, continue Lipitor and routine cardiology follow-up.  Essential hypertension  -Stable, continue same regimen.  Labs as above.    No orders of the defined types were placed in this encounter.  Patient Instructions  Glad to hear the breathing is better.  Schedule appointment with eye specialist, and dentist as well - check with your insurance to see if particular provider in network. That is very important when using chewing tobacco.  Call hematology to schedule follow up appointment  I will check some labs today.   Return to the clinic or go to the nearest emergency room if any of your symptoms worsen or new symptoms occur.  Take care.   Preventive Care 32 Years and Older, Male Preventive care refers to lifestyle choices and visits with your health care provider that can promote health and wellness. Preventive care visits are also called wellness exams. What can I expect for my preventive care visit? Counseling During your preventive care visit, your health care provider [place] ask about your: Medical history, including: Past medical problems. Family medical history. History of falls. Current health, including: Emotional well-being. Home life and  relationship well-being. Sexual activity. Memory and ability to understand (cognition). Lifestyle, including: Alcohol, nicotine or tobacco, and drug use. Access to firearms. Diet, exercise, and sleep habits. Work and work Statistician. Sunscreen use. Safety issues such as seatbelt and bike helmet use. Physical exam Your health care provider will check your: Height and weight. These [place] be used to calculate your BMI (body mass index). BMI is a measurement that tells if you are at a healthy weight. Waist circumference. This measures the distance around your waistline. This measurement also tells if you are at a healthy weight and [place] help predict your risk of certain diseases, such as type 2 diabetes and high blood pressure. Heart rate and blood pressure. Body temperature. Skin for abnormal spots. What immunizations do I need?  Vaccines are usually given at various ages, according to a schedule. Your health care provider will recommend vaccines for you based on your age, medical history, and lifestyle or other factors, such as travel or where you work. What tests do I need? Screening Your health care provider [place] recommend screening tests for certain conditions.  This [place] include: Lipid and cholesterol levels. Diabetes screening. This is done by checking your blood sugar (glucose) after you have not eaten for a while (fasting). Hepatitis C test. Hepatitis B test. HIV (human immunodeficiency virus) test. STI (sexually transmitted infection) testing, if you are at risk. Lung cancer screening. Colorectal cancer screening. Prostate cancer screening. Abdominal aortic aneurysm (AAA) screening. You [place] need this if you are a current or former smoker. Talk with your health care provider about your test results, treatment options, and if necessary, the need for more tests. Follow these instructions at home: Eating and drinking  Eat a diet that includes fresh fruits and vegetables, whole  grains, lean protein, and low-fat dairy products. Limit your intake of foods with high amounts of sugar, saturated fats, and salt. Take vitamin and mineral supplements as recommended by your health care provider. Do not drink alcohol if your health care provider tells you not to drink. If you drink alcohol: Limit how much you have to 0-2 drinks a day. Know how much alcohol is in your drink. In the [place] one drink equals one 12 oz bottle of beer (355 mL), one 5 oz glass of wine (148 mL), or one 1 oz glass of hard liquor (44 mL). Lifestyle Brush your teeth every morning and night with fluoride toothpaste. Floss one time each day. Exercise for at least 30 minutes 5 or more days each week. Do not use any products that contain nicotine or tobacco. These products include cigarettes, chewing tobacco, and vaping devices, such as e-cigarettes. If you need help quitting, ask your health care provider. Do not use drugs. If you are sexually active, practice safe sex. Use a condom or other form of protection to prevent STIs. Take aspirin only as told by your health care provider. Make sure that you understand how much to take and what form to take. Work with your health care provider to find out whether it is safe and beneficial for you to take aspirin daily. Ask your health care provider if you need to take a cholesterol-lowering medicine (statin). Find healthy ways to manage stress, such as: Meditation, yoga, or listening to music. Journaling. Talking to a trusted person. Spending time with friends and family. Safety Always wear your seat belt while driving or riding in a vehicle. Do not drive: If you have been drinking alcohol. Do not ride with someone who has been drinking. When you are tired or distracted. While texting. If you have been using any mind-altering substances or drugs. Wear a helmet and other protective equipment during sports activities. If you have firearms in your house, make sure  you follow all gun safety procedures. Minimize exposure to UV radiation to reduce your risk of skin cancer. What's next? Visit your health care provider once a year for an annual wellness visit. Ask your health care provider how often you should have your eyes and teeth checked. Stay up to date on all vaccines. This information is not intended to replace advice given to you by your health care provider. Make sure you discuss any questions you have with your health care provider. Document Revised: 01/14/2021 Document Reviewed: 01/14/2021 Elsevier Patient Education  [place],   [name], MD [place] 12/21/21 3:08 PM

## 2021-12-21 NOTE — Patient Instructions (Addendum)
Glad to hear the breathing is better.  Schedule appointment with eye specialist, and dentist as well - check with your insurance to see if particular provider in network. That is very important when using chewing tobacco.  Call hematology to schedule follow up appointment  I will check some labs today.   Return to the clinic or go to the nearest emergency room if any of your symptoms worsen or new symptoms occur.  Take care.   Preventive Care 28 Years and Older, Male Preventive care refers to lifestyle choices and visits with your health care provider that can promote health and wellness. Preventive care visits are also called wellness exams. What can I expect for my preventive care visit? Counseling During your preventive care visit, your health care provider Barile ask about your: Medical history, including: Past medical problems. Family medical history. History of falls. Current health, including: Emotional well-being. Home life and relationship well-being. Sexual activity. Memory and ability to understand (cognition). Lifestyle, including: Alcohol, nicotine or tobacco, and drug use. Access to firearms. Diet, exercise, and sleep habits. Work and work Statistician. Sunscreen use. Safety issues such as seatbelt and bike helmet use. Physical exam Your health care provider will check your: Height and weight. These Clymer be used to calculate your BMI (body mass index). BMI is a measurement that tells if you are at a healthy weight. Waist circumference. This measures the distance around your waistline. This measurement also tells if you are at a healthy weight and Manning help predict your risk of certain diseases, such as type 2 diabetes and high blood pressure. Heart rate and blood pressure. Body temperature. Skin for abnormal spots. What immunizations do I need?  Vaccines are usually given at various ages, according to a schedule. Your health care provider will recommend vaccines for you  based on your age, medical history, and lifestyle or other factors, such as travel or where you work. What tests do I need? Screening Your health care provider Stahly recommend screening tests for certain conditions. This Fong include: Lipid and cholesterol levels. Diabetes screening. This is done by checking your blood sugar (glucose) after you have not eaten for a while (fasting). Hepatitis C test. Hepatitis B test. HIV (human immunodeficiency virus) test. STI (sexually transmitted infection) testing, if you are at risk. Lung cancer screening. Colorectal cancer screening. Prostate cancer screening. Abdominal aortic aneurysm (AAA) screening. You Anagnos need this if you are a current or former smoker. Talk with your health care provider about your test results, treatment options, and if necessary, the need for more tests. Follow these instructions at home: Eating and drinking  Eat a diet that includes fresh fruits and vegetables, whole grains, lean protein, and low-fat dairy products. Limit your intake of foods with high amounts of sugar, saturated fats, and salt. Take vitamin and mineral supplements as recommended by your health care provider. Do not drink alcohol if your health care provider tells you not to drink. If you drink alcohol: Limit how much you have to 0-2 drinks a day. Know how much alcohol is in your drink. In the [place] one drink equals one 12 oz bottle of beer (355 mL), one 5 oz glass of wine (148 mL), or one 1 oz glass of hard liquor (44 mL). Lifestyle Brush your teeth every morning and night with fluoride toothpaste. Floss one time each day. Exercise for at least 30 minutes 5 or more days each week. Do not use any products that contain nicotine or tobacco. These products  include cigarettes, chewing tobacco, and vaping devices, such as e-cigarettes. If you need help quitting, ask your health care provider. Do not use drugs. If you are sexually active, practice safe sex. Use a  condom or other form of protection to prevent STIs. Take aspirin only as told by your health care provider. Make sure that you understand how much to take and what form to take. Work with your health care provider to find out whether it is safe and beneficial for you to take aspirin daily. Ask your health care provider if you need to take a cholesterol-lowering medicine (statin). Find healthy ways to manage stress, such as: Meditation, yoga, or listening to music. Journaling. Talking to a trusted person. Spending time with friends and family. Safety Always wear your seat belt while driving or riding in a vehicle. Do not drive: If you have been drinking alcohol. Do not ride with someone who has been drinking. When you are tired or distracted. While texting. If you have been using any mind-altering substances or drugs. Wear a helmet and other protective equipment during sports activities. If you have firearms in your house, make sure you follow all gun safety procedures. Minimize exposure to UV radiation to reduce your risk of skin cancer. What's next? Visit your health care provider once a year for an annual wellness visit. Ask your health care provider how often you should have your eyes and teeth checked. Stay up to date on all vaccines. This information is not intended to replace advice given to you by your health care provider. Make sure you discuss any questions you have with your health care provider. Document Revised: 01/14/2021 Document Reviewed: 01/14/2021 Elsevier Patient Education  [place].

## 2021-12-22 ENCOUNTER — Other Ambulatory Visit: Payer: Self-pay | Admitting: Family Medicine

## 2021-12-22 DIAGNOSIS — R7989 Other specified abnormal findings of blood chemistry: Secondary | ICD-10-CM

## 2021-12-22 DIAGNOSIS — D72829 Elevated white blood cell count, unspecified: Secondary | ICD-10-CM

## 2021-12-22 LAB — COMPREHENSIVE METABOLIC PANEL
ALT: 12 U/L (ref 0–53)
AST: 24 U/L (ref 0–37)
Albumin: 3.7 g/dL (ref 3.5–5.2)
Alkaline Phosphatase: 143 U/L — ABNORMAL HIGH (ref 39–117)
BUN: 20 mg/dL (ref 6–23)
CO2: 30 mEq/L (ref 19–32)
Calcium: 9.7 mg/dL (ref 8.4–10.5)
Chloride: 99 mEq/L (ref 96–112)
Creatinine, Ser: 1.67 mg/dL — ABNORMAL HIGH (ref 0.40–1.50)
GFR: 41.02 mL/min — ABNORMAL LOW (ref >60.00)
Glucose, Bld: 98 mg/dL (ref 70–99)
Potassium: 3.9 mEq/L (ref 3.5–5.1)
Sodium: 139 mEq/L (ref 135–145)
Total Bilirubin: 0.6 mg/dL (ref 0.2–1.2)
Total Protein: 6.5 g/dL (ref 6.0–8.3)

## 2021-12-22 LAB — CBC
HCT: 36.7 % — ABNORMAL LOW (ref 39.0–52.0)
Hemoglobin: 12.8 g/dL — ABNORMAL LOW (ref 13.0–17.0)
MCHC: 34.8 g/dL (ref 30.0–36.0)
MCV: 85.1 fl (ref 78.0–100.0)
Platelets: 221 10*3/uL (ref 150.0–400.0)
RBC: 4.31 Mil/uL (ref 4.22–5.81)
RDW: 15.3 % (ref 11.5–15.5)
WBC: 11.8 10*3/uL — ABNORMAL HIGH (ref 4.0–10.5)

## 2021-12-22 LAB — LIPID PANEL
Cholesterol: 187 mg/dL (ref 0–200)
HDL: 65.2 mg/dL (ref >39.00)
LDL Cholesterol: 94 mg/dL (ref 0–99)
NonHDL: 121.42
Total CHOL/HDL Ratio: 3
Triglycerides: 135 mg/dL (ref 0.0–149.0)
VLDL: 27 mg/dL (ref 0.0–40.0)

## 2021-12-22 NOTE — Progress Notes (Signed)
See lab notes.  Repeat CBC, BMP to evaluate creatinine, leukocytosis in the next 2 days.

## 2021-12-23 ENCOUNTER — Other Ambulatory Visit: Payer: Self-pay | Admitting: Cardiology

## 2021-12-23 DIAGNOSIS — I5031 Acute diastolic (congestive) heart failure: Secondary | ICD-10-CM

## 2021-12-24 ENCOUNTER — Other Ambulatory Visit (INDEPENDENT_AMBULATORY_CARE_PROVIDER_SITE_OTHER): Payer: Medicare Other

## 2021-12-24 ENCOUNTER — Other Ambulatory Visit: Payer: Self-pay | Admitting: Family Medicine

## 2021-12-24 DIAGNOSIS — I509 Heart failure, unspecified: Secondary | ICD-10-CM

## 2021-12-24 DIAGNOSIS — R7989 Other specified abnormal findings of blood chemistry: Secondary | ICD-10-CM | POA: Diagnosis not present

## 2021-12-24 DIAGNOSIS — D72829 Elevated white blood cell count, unspecified: Secondary | ICD-10-CM | POA: Diagnosis not present

## 2021-12-24 DIAGNOSIS — Z23 Encounter for immunization: Secondary | ICD-10-CM | POA: Diagnosis not present

## 2021-12-24 LAB — BASIC METABOLIC PANEL
BUN: 23 mg/dL (ref 6–23)
CO2: 29 mEq/L (ref 19–32)
Calcium: 9.4 mg/dL (ref 8.4–10.5)
Chloride: 100 mEq/L (ref 96–112)
Creatinine, Ser: 1.78 mg/dL — ABNORMAL HIGH (ref 0.40–1.50)
GFR: 38 mL/min — ABNORMAL LOW (ref >60.00)
Glucose, Bld: 92 mg/dL (ref 70–99)
Potassium: 3.6 mEq/L (ref 3.5–5.1)
Sodium: 140 mEq/L (ref 135–145)

## 2021-12-24 LAB — CBC
HCT: 36.2 % — ABNORMAL LOW (ref 39.0–52.0)
Hemoglobin: 12.3 g/dL — ABNORMAL LOW (ref 13.0–17.0)
MCHC: 34 g/dL (ref 30.0–36.0)
MCV: 84.8 fl (ref 78.0–100.0)
Platelets: 258 10*3/uL (ref 150.0–400.0)
RBC: 4.27 Mil/uL (ref 4.22–5.81)
RDW: 15.4 % (ref 11.5–15.5)
WBC: 11.3 10*3/uL — ABNORMAL HIGH (ref 4.0–10.5)

## 2021-12-24 NOTE — Progress Notes (Signed)
See lab notes.  Increasing creatinine Arpin be due to overdiuresis.  Will back off furosemide temporarily to 20 mg/day with repeat creatinine in 5 days.  ER precautions if any new or worsening symptoms sooner.  Has follow-up with me scheduled in 6 days.

## 2021-12-24 NOTE — Progress Notes (Signed)
Per Dr Carlota Raspberry pt will be scheduled for visit next week

## 2021-12-24 NOTE — Progress Notes (Signed)
Presented for nurse visit for blood work and vaccine.  He did notate to staff that he has been swimmy headed.  Vitals were obtained, on brief discussion he states the symptoms have been present for months, no syncope/near syncope, no recent changes or worsening.  Plan to follow-up next week after blood work obtained to discuss further with ER precautions/RTC precautions if any acute worsening.

## 2021-12-24 NOTE — Addendum Note (Signed)
Addended by: Patrcia Dolly on: 12/24/2021 02:08 PM   Modules accepted: Level of Service

## 2021-12-25 ENCOUNTER — Ambulatory Visit: Payer: Medicare Other | Admitting: Cardiology

## 2021-12-25 ENCOUNTER — Encounter: Payer: Self-pay | Admitting: Cardiology

## 2021-12-25 VITALS — BP 115/71 | HR 91 | Temp 98.0°F | Resp 17 | Ht 70.0 in | Wt 167.2 lb

## 2021-12-25 DIAGNOSIS — I25118 Atherosclerotic heart disease of native coronary artery with other forms of angina pectoris: Secondary | ICD-10-CM | POA: Diagnosis not present

## 2021-12-25 DIAGNOSIS — I5032 Chronic diastolic (congestive) heart failure: Secondary | ICD-10-CM | POA: Diagnosis not present

## 2021-12-25 MED ORDER — POTASSIUM CHLORIDE CRYS ER 10 MEQ PO TBCR: 10.0000 meq | EXTENDED_RELEASE_TABLET | Freq: Every day | ORAL | 3 refills | Status: DC | PRN

## 2021-12-25 MED ORDER — FUROSEMIDE 40 MG PO TABS: 40.0000 mg | ORAL_TABLET | Freq: Every day | ORAL | 1 refills | Status: DC | PRN

## 2021-12-25 NOTE — Progress Notes (Signed)
Follow up visit  Subjective:   Gilbert [name], male    DOB: 12-23-1950, 71 y.o.   MRN: [id number]   Chief Complaint  Patient presents with   Acute diastolic heart failure ([place])    2 WEEK    71 y.o. Caucasian male  with CAD s/p MI, LM stenting 2015, ischemic cardiomyopathy with recovery in EF to 55% on guideline directed medical therapy, PAD s/p Left external iliac/common femoral stenting 2015, h/o left nephrectomy for renal cell carcinoma, h/o NHL, currently in remission.  He was seen in our office on 12/10/2021 when he walked into our office with worsening dyspnea, his PCP having told him to go to the emergency room for evaluation of heart failure.  He refused to go to the emergency room, I had seen him and felt he was in acute decompensated heart failure.  States that he has lost 10 pounds in weight, leg edema has completely resolved, dyspnea is back to baseline.  He has now resumed all his routine physical activities and feels well.  He still has some cough but states that this is "normal" to him.  No chest pain or palpitations.  Current Outpatient Medications:    acetaminophen (TYLENOL) 500 MG tablet, Take 500 mg by mouth every 6 (six) hours as needed., Disp: , Rfl:    atorvastatin (LIPITOR) 80 MG tablet, Take 1 tablet by mouth once daily, Disp: 90 tablet, Rfl: 0   carvedilol (COREG) 12.5 MG tablet, TAKE 1 TABLET BY MOUTH TWICE DAILY WITH A MEAL, Disp: 180 tablet, Rfl: 3   clopidogrel (PLAVIX) 75 MG tablet, Take 1 tablet (75 mg total) by mouth daily., Disp: 90 tablet, Rfl: 3   Cyanocobalamin (B-12) 5000 MCG CAPS, Take 5,000 mcg by mouth every morning. , Disp: , Rfl:    ezetimibe (ZETIA) 10 MG tablet, Take 1 tablet by mouth once daily, Disp: 90 tablet, Rfl: 0   famotidine (PEPCID) 20 MG tablet, Take 20 mg by mouth 2 (two) times daily., Disp: , Rfl:    phenytoin (DILANTIN) 300 MG ER capsule, Take 1 capsule (300 mg total) by mouth daily., Disp: 90 capsule, Rfl: 1   pyridOXINE (VITAMIN B-6)  100 MG tablet, Take 100 mg by mouth every morning. , Disp: , Rfl:    sacubitril-valsartan (ENTRESTO) 49-51 MG, Take 1 tablet by mouth 2 (two) times daily., Disp: 180 tablet, Rfl: 3   Thiamine HCl (VITAMIN B-1) 250 MG tablet, Take 250 mg by mouth every morning. , Disp: , Rfl:    furosemide (LASIX) 40 MG tablet, Take 1 tablet (40 mg total) by mouth daily as needed. For fluid build up or weight gain of 2 Lbs, Disp: 90 tablet, Rfl: 1   potassium chloride (KLOR-CON M) 10 MEQ tablet, Take 1 tablet (10 mEq total) by mouth daily as needed (With Furosemide). Take with Furosemide (Fluid Pill), Disp: 90 tablet, Rfl: 3   predniSONE (DELTASONE) 20 MG tablet, Take 2 tablets (40 mg total) by mouth daily with breakfast., Disp: 10 tablet, Rfl: 0    Cardiovascular & other pertient studies:  Coronary angiogram 06/10/2015: LM: DES stent placed on 11/27/2013 widely patent.   LAD: Ostial and proximal LAD stent widely patent again placed on 11/27/2013. Mid to distal segment shows very mild luminal irregularity. Gives origin to several small diagonals. There is collateralization from the left to the distal right coronary artery. Also gives collaterals to the occluded mid to distal circumflex coronary artery.   Ramus intermediate: Large vessel. Midsegment of the  ramus intermediate shows mild 20-30% diffuse % luminal irregularity, proximal segment shows a 40% stenosis.   Circumflex coronary artery: A small vessel. Severely diffusely diseased. Ostium has a 70-80% stenosis in some new appears to be 90% unchanged from prior angiography. Occluded in the midsegment. Collaterals of obtuse marginal is evident with excellent filling of the distal circumflex.   Right coronary artery co-dominant vessel, it is occluded after the origin of the RV branch. Type II collaterals evident from the left to the right coronary artery. The RV branch has diffuse disease, mid segment has 90% stenosis, again unchanged from prior angiography in April  2015.    Impression: Abnormal stress test due to occluded distal circumflex and right coronary artery, this is chronic total occlusion.  Lexiscan thalium stress test 06/09/2017: 1. Prognostically, this is a high risk study. Resting EKG demonstrates ectopic atrial rhythm and sinus rhythm with LVH with repolarization, cannot exclude lateral ischemia. Stress EKG is nondiagnostic for ischemia as is the pharmacologic stress test. Stress symptoms included dyspnea and dizziness. 2. There is a mild area of ischemia of small size in the septal wall.  There is very large scar of the inferolateral segment with very mild residual ischemia. 3. Compared to the prior study dated 05/09/2015, the current study now reveals Inferior scar with mild lateral ischemia new. EF has decreaed from 30% to 17%.  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 Right common carotid artery and right external carotid artery stenosis of (<50%). Stenosis in the left internal carotid artery (16-49%).  Antegrade right vertebral artery flow. Antegrade left vertebral artery flow. Compared to the study done on 01/27/2018,  right ICA stenosis is new.  Left ICA stenosis was >50%. F/U in 6 months.  Echocardiogram 03/11/2021:  LV endocardial border difficult to visualize. However, LV cavity, wall  thickness, and systolic function is probably normal with LVEF around  50-55%. Grade 1 diastolic function. Normal left atrial pressure.    Mild (Grade I) mitral regurgitation. Mild tricuspid regurgitation. Peak  RA-RV gradient 21 mmHg.  The IVC is not well visualized.  Pericardial fat pad seen. Appearance similar to previous echocardiogram in 2019.  Overall, no significant change noted compared to previous study in 2019.   EKG   EKG 12/10/2021: Normal sinus rhythm at rate of 70 bpm with frequent ectopic atrial beats, single PVC.  Nonspecific ST abnormality, cannot exclude high lateral ischemia.  No significant change  from 09/09/2021.     Latest Ref Rng & Units 12/24/2021    1:46 PM 12/21/2021    3:12 PM 12/10/2021    4:21 PM  CMP  Glucose 70 - 99 mg/dL 92   98   103    BUN 6 - 23 mg/dL '23   20   15    '$ Creatinine 0.40 - 1.50 mg/dL 1.78   1.67   1.35    Sodium 135 - 145 mEq/L 140   139   142    Potassium 3.5 - 5.1 mEq/L 3.6   3.9   3.7    Chloride 96 - 112 mEq/L 100   99   98    CO2 19 - 32 mEq/L '29   30   27    '$ Calcium 8.4 - 10.5 mg/dL 9.4   9.7   9.0    Total Protein 6.0 - 8.3 g/dL  6.5     Total Bilirubin 0.2 - 1.2 mg/dL  0.6     Alkaline Phos 39 -  117 U/L  143     AST 0 - 37 U/L  24     ALT 0 - 53 U/L  12         Latest Ref Rng & Units 12/24/2021    1:46 PM 12/21/2021    3:12 PM 03/16/2021    2:52 PM  CBC  WBC 4.0 - 10.5 K/uL 11.3   11.8   7.9    Hemoglobin 13.0 - 17.0 g/dL 12.3   12.8   12.9    Hematocrit 39.0 - 52.0 % 36.2   36.7   36.9    Platelets 150.0 - 400.0 K/uL 258.0   221.0   162.0     Lab Results  Component Value Date   CHOL 187 12/21/2021   HDL 65.20 12/21/2021   LDLCALC 94 12/21/2021   TRIG 135.0 12/21/2021   CHOLHDL 3 12/21/2021    HEMOGLOBIN A1C Lab Results  Component Value Date   HGBA1C 5.1 08/13/2014   MPG 100 08/13/2014   TSH No results for input(s): TSH in the last 8760 hours.   Review of Systems  Cardiovascular:  Positive for dyspnea on exertion. Negative for chest pain, leg swelling, orthopnea, palpitations and syncope.  Neurological:  Positive for dizziness.   Objective:      12/25/2021    1:29 PM 12/24/2021    2:06 PM 12/21/2021    2:03 PM  Vitals with BMI  Height '5\' 10"'$   '5\' 10"'$   Weight 167 lbs 3 oz  167 lbs 10 oz  BMI 29.92  42.68  Systolic 341 962 229  Diastolic 71 70 66  Pulse 91 58 73    Today's Vitals   12/25/21 1329  BP: 115/71  Pulse: 91  Resp: 17  Temp: 98 F (36.7 C)  TempSrc: Temporal  SpO2: 95%  Weight: 167 lb 3.2 oz (75.8 kg)  Height: '5\' 10"'$  (1.778 m)   Body mass index is 23.99 kg/m.    Physical Exam Vitals reviewed.   Neck:     Vascular: No JVD.  Cardiovascular:     Rate and Rhythm: Normal rate and regular rhythm.     Pulses:          Dorsalis pedis pulses are 0 on the right side and 0 on the left side.       Posterior tibial pulses are 1+ on the right side and 1+ on the left side.     Heart sounds: Normal heart sounds. No murmur heard. Pulmonary:     Effort: Pulmonary effort is normal.     Breath sounds: Normal breath sounds.  Abdominal:     General: Bowel sounds are normal.     Palpations: Abdomen is soft.  Musculoskeletal:     Right lower leg: No edema.     Left lower leg: No edema.      Assessment & Recommendations:      ICD-10-CM   1. Chronic diastolic heart failure (HCC)  I50.32 potassium chloride (KLOR-CON M) 10 MEQ tablet    furosemide (LASIX) 40 MG tablet    2. Coronary artery disease of native artery of native heart with stable angina pectoris [facility])  I25.118       71 y.o. y/o Caucasian male with CAD s/p MI, LM stenting 2015, ischemic cardiomyopathy with recovery in EF to 55% on guideline directed medical therapy, PAD s/p Left external iliac/common femoral stenting 2015, h/o left nephrectomy for renal cell carcinoma, h/o NHL, currently in remission.  Unfortunately discussions regarding  chewing tobacco not to be held as patient does not intend to quit chewing tobacco.  He was seen in our office on 12/10/2021 when he walked into our office with worsening dyspnea, his PCP having told him to go to the emergency room for evaluation of heart failure.  He refused to go to the emergency room, I had seen him and felt he was in acute decompensated heart failure.  Fortunately he recuperated well with diuretics, has lost 10 pounds in weight, his dyspnea is back to baseline, complete resolution of leg edema, no further orthopnea or PND.  Advised him to change his Lasix and potassium supplements to be taken on a as needed basis for weight gain of >2 pounds, leg edema or worsening dyspnea.  He has  high risk for recurrence of heart failure.  Echocardiogram is still pending.  I would like to see him back in 4 weeks for follow-up.  I will also set him up for a nuclear stress test as it has been greater than 4 to 5 years since last stress test.  Patient has extensive medical history, he has carotid artery stenosis that is asymptomatic, renal insufficiency, peripheral arterial disease.  I will try to address each 1 of these has been:.   Adrian Prows, MD, [facility] 12/25/2021, 6:22 PM Office: [phone] Fax: [phone] Pager: [phone]

## 2021-12-30 ENCOUNTER — Ambulatory Visit (INDEPENDENT_AMBULATORY_CARE_PROVIDER_SITE_OTHER): Payer: Medicare Other | Admitting: Family Medicine

## 2021-12-30 VITALS — BP 132/78 | HR 80 | Temp 97.7°F | Resp 16 | Ht 70.0 in | Wt 164.8 lb

## 2021-12-30 DIAGNOSIS — R7989 Other specified abnormal findings of blood chemistry: Secondary | ICD-10-CM | POA: Diagnosis not present

## 2021-12-30 DIAGNOSIS — I509 Heart failure, unspecified: Secondary | ICD-10-CM | POA: Diagnosis not present

## 2021-12-30 DIAGNOSIS — D72829 Elevated white blood cell count, unspecified: Secondary | ICD-10-CM

## 2021-12-30 DIAGNOSIS — R42 Dizziness and giddiness: Secondary | ICD-10-CM | POA: Diagnosis not present

## 2021-12-30 NOTE — Progress Notes (Signed)
Subjective:  Patient ID: Gilbert [name], male    DOB: 08-24-1950  Age: 71 y.o. MRN: [id number]  CC:  Chief Complaint  Patient presents with   Dizziness    Pt reports Gilbert [name] be slightly better, otherwise no change     HPI Gilbert [name] presents for   Dizziness/lightheadedness See prior visits.  Nurse visit last week, made notation that he was chronically lightheaded.  Denied any recent changes.  Has recently been seen by cardiology for CHF flare and appeared euvolemic last visit.  His weight was down 10 pounds.   Cardiology follow-up visit [place] 26.  Back to baseline at that time with resolution of leg edema.  Changed his Lasix and potassium supplement to be as needed only for weight gain of greater than 2 pounds or leg edema/worsening dyspnea.  Plan for 4-week follow-up and pending echo.  History of carotid artery stenosis. Creatinine was increasing on [place] 25 with baseline approximately 1.25-1.41,  Up to 1.67 on [place] 22, 1.78 on [place] 25th -recommended decreasing to half dose of furosemide as possible overdiuresis as contributor. Borderline leukocytosis of 11.3 on 5/25, improved from 11.8 on 5/22. Carotid Dopplers reviewed from [place] 14, 2023 at cardiology, stenosis in the R ICA 1 to 15%, L ICA 16 to 49%, LECA less than 50%, no significant change from July 2020.  1 year follow-up planned.   Lightheadedness about the same - "getting used to it" past 3-4 months  Drinking coffee, sweet tea, some water per day - 2 cups. Notes with getting up to fast - has discussed with cardiology.  BP Readings from Last 3 Encounters:  12/30/21 132/78  12/25/21 115/71  12/24/21 118/70  Cardiac echo planned in 2 days. No chest pain.  No swelling in legs.  Did not stop taking furosemide - still taking '40mg'$  per day.  No fevers. No current pains.  Urinating ok.  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   Hematuria 10/29/2015   Dysphagia, pharyngoesophageal phase    Early satiety    Esophageal stricture    Gastritis and gastroduodenitis    PAD (peripheral artery disease) ([place]) 05/28/2014   Cardiomyopathy, ischemic 04/03/2014   History of left heart catheterization ([facility]) 02/26/2014   Arterial bleed, intraoperative 02/26/2014   Seizures ([place]) 11/27/2013   Essential hypertension 11/27/2013   Hypomagnesemia 11/27/2013   Coronary artery disease involving native coronary artery of native heart without angina pectoris 11/26/2013   Renal cancer ([place]) 10/19/2013   Coronary artery calcification 09/25/2013   Renal cyst 08/30/2013   Loss of weight 08/03/2013   Past Medical History:  Diagnosis Date   Abnormal liver enzymes    Chronic alkaline phosphatase elevation since 2014   Acid reflux    CAD (coronary artery disease)    Cancer ([place]) 08/2013   right kidney cancer   Cancer ([place])    Coronary artery calcification seen on CAT scan    Coronary atherosclerosis of native  coronary artery    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 Used Impella Circulatory assist device. EF= 20-25%   History of epilepsy     at least 10 years ago. 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 EF 45% 04/10/2014   Therapeutic drug monitoring    Past Surgical History:  Procedure Laterality Date   BALLOON DILATION N/A 01/21/2015   Procedure: BALLOON DILATION;  Surgeon: Gatha Mayer, MD;  Location: [facility];  Service: Endoscopy;  Laterality: N/A;   CARDIAC CATHETERIZATION N/A 06/10/2015   Procedure: Left Heart Cath and Coronary Angiography;  Surgeon: Adrian Prows, MD;  Location: [facility];  Service: Cardiovascular;  Laterality: N/A;   CORONARY ANGIOPLASTY WITH STENT PLACEMENT  08/02/2013   Left main coronary artery stenting on emergent basis along with circulatory support with Impella device through left leg. With 4.0 x 18 mm Xience alpine stent   EAR CYST EXCISION Left 05/31/2018   Procedure: EXCISION LEFT POSTERIOR EAR TUMOR;  Surgeon: Leta Baptist, MD;  Location: [facility];  Service: ENT;  Laterality: Left;   ESOPHAGOGASTRODUODENOSCOPY     ESOPHAGOGASTRODUODENOSCOPY N/A 01/21/2015   Procedure: ESOPHAGOGASTRODUODENOSCOPY (EGD) with possible dilation.;  Surgeon: Gatha Mayer, MD;  Location: [facility];  Service: Endoscopy;  Laterality: N/A;   EYE SURGERY Left 2 weeks ago   torn retina   ILIAC ARTERY STENT Left 05/28/2014   dr Einar Gip   LEFT HEART CATHETERIZATION WITH CORONARY ANGIOGRAM N/A 11/27/2013   Procedure: LEFT HEART CATHETERIZATION WITH CORONARY ANGIOGRAM;  Surgeon: [name], MD;  Location: [facility] CATH LAB;  Service: Cardiovascular;  Laterality: N/A;    LOWER EXTREMITY ANGIOGRAM N/A 02/26/2014   Procedure: LOWER EXTREMITY ANGIOGRAM;  Surgeon: [name], MD;  Location: [facility] CATH LAB;  Service: Cardiovascular;  Laterality: N/A;   LOWER EXTREMITY ANGIOGRAM N/A 05/28/2014   Procedure: LOWER EXTREMITY ANGIOGRAM;  Surgeon: [name], MD;  Location: [facility] CATH LAB;  Service: Cardiovascular;  Laterality: N/A;   NEPHRECTOMY  11/30/2013   PERCUTANEOUS CORONARY STENT INTERVENTION (PCI-S)  11/27/2013   Procedure: PERCUTANEOUS CORONARY STENT INTERVENTION (PCI-S);  Surgeon: [name], MD;  Location: [facility] CATH LAB;  Service: Cardiovascular;;   ROBOT ASSISTED LAPAROSCOPIC NEPHRECTOMY Right 10/19/2013   Procedure: ROBOTIC ASSISTED LAPAROSCOPIC NEPHRECTOMY,  EXTENSIVE ADHESIOLYSIS;  Surgeon: Alexis Frock, MD;  Location: [facility];  Service: Urology;  Laterality: Right;   SKIN FULL THICKNESS GRAFT Left 05/31/2018   Procedure: SKIN GRAFT FULL THICKNESS FROM LEFT ABDOMEN TO LEFT EAR;  Surgeon: Leta Baptist, MD;  Location: [facility];  Service:  ENT;  Laterality: Left;   Allergies  Allergen Reactions   Oxycodone Swelling and Other (See Comments)    Tongue and lips swell   Prior to Admission medications   Medication Sig Start Date End Date Taking? Authorizing Provider  acetaminophen (TYLENOL) 500 MG tablet Take 500 mg by mouth every 6 (six) hours as needed.    [provider]  atorvastatin (LIPITOR) 80 MG tablet Take 1 tablet by mouth once daily 11/30/21   Patwardhan, Manish J, MD  carvedilol (COREG) 12.5 MG tablet TAKE 1 TABLET BY MOUTH TWICE DAILY WITH A MEAL 09/22/21   Patwardhan, Manish J, MD  clopidogrel (PLAVIX) 75 MG tablet Take 1 tablet (75 mg total) by mouth daily. 09/09/21   Patwardhan, Reynold Bowen, MD  Cyanocobalamin (B-12) 5000 MCG CAPS Take 5,000 mcg by mouth every morning.     [provider]  ezetimibe (ZETIA) 10 MG tablet Take 1 tablet by mouth once daily 03/02/21   Patwardhan, Manish J, MD  famotidine (PEPCID) 20 MG tablet Take 20  mg by mouth 2 (two) times daily.    [provider]  furosemide (LASIX) 40 MG tablet Take 1 tablet (40 mg total) by mouth daily as needed. For fluid build up or weight gain of 2 Lbs 12/25/21   Adrian Prows, MD  phenytoin (DILANTIN) 300 MG ER capsule Take 1 capsule (300 mg total) by mouth daily. 10/08/21   Wendie Agreste, MD  potassium chloride (KLOR-CON M) 10 MEQ tablet Take 1 tablet (10 mEq total) by mouth daily as needed (With Furosemide). Take with Furosemide (Fluid Pill) 12/25/21   Adrian Prows, MD  predniSONE (DELTASONE) 20 MG tablet Take 2 tablets (40 mg total) by mouth daily with breakfast. 10/08/21   Wendie Agreste, MD  pyridOXINE (VITAMIN B-6) 100 MG tablet Take 100 mg by mouth every morning.     [provider]  sacubitril-valsartan (ENTRESTO) 49-51 MG Take 1 tablet by mouth 2 (two) times daily. 09/29/21   Patwardhan, Reynold Bowen, MD  Thiamine HCl (VITAMIN B-1) 250 MG tablet Take 250 mg by mouth every morning.     [provider]   Social History   Socioeconomic History   Marital status: Divorced    Spouse name: Not on file   Number of children: 2   Years of education: Not on file   Highest education level: Not on file  Occupational History   Occupation: retired  Tobacco Use   Smoking status: Former    Packs/day: 2.00    Years: 45.00    Pack years: 90.00    Types: Cigarettes    Quit date: 08/03/2003    Years since quitting: 18.4   Smokeless tobacco: Current    Types: Snuff   Tobacco comments:    quit  smoking 10 years agop  Vaping Use   Vaping Use: Never used  Substance and Sexual Activity   Alcohol use: No    Alcohol/week: 0.0 standard drinks    Comment: quit 1980   Drug use: No    Comment: none in past 30 years    Sexual activity: Not Currently  Other Topics Concern   Not on file  Social History Narrative   ** Merged History Encounter **       Social Determinants of Health   Financial Resource Strain: Not on file  Food Insecurity: Not on  file  Transportation Needs: Not on file  Physical Activity: Not on file  Stress: Not on file  Social  Connections: Not on file  Intimate Partner Violence: Not on file    Review of Systems Per HPI.   Objective:   Vitals:   12/30/21 1418  BP: 132/78  Pulse: 80  Resp: 16  Temp: 97.7 F (36.5 C)  TempSrc: Oral  SpO2: 94%  Weight: 164 lb 12.8 oz (74.8 kg)  Height: '5\' 10"'$  (1.778 m)     Physical Exam Vitals reviewed.  Constitutional:      Appearance: He is well-developed.  HENT:     Head: Normocephalic and atraumatic.  Neck:     Vascular: No carotid bruit or JVD.  Cardiovascular:     Rate and Rhythm: Normal rate and regular rhythm.     Heart sounds: Normal heart sounds. No murmur heard. Pulmonary:     Effort: Pulmonary effort is normal.     Breath sounds: Normal breath sounds. No rales.  Musculoskeletal:     Right lower leg: No edema.     Left lower leg: No edema.  Skin:    General: Skin is warm and dry.  Neurological:     Mental Status: He is alert and oriented to person, place, and time.  Psychiatric:        Mood and Affect: Mood normal.       Assessment & Plan:  Gilbert [name] is a 71 y.o. male . Elevated serum creatinine - Plan: Basic metabolic panel Lightheaded - Plan: CBC Congestive heart failure, unspecified HF chronicity, unspecified heart failure type ([place]) - Plan: Basic metabolic panel  -Possible component of volume depletion with continued use of diuretic.  Hold furosemide temporarily along with potassium, check electrolytes, increase fluid intake, ER precautions given if worsening symptoms.  Return to use of furosemide if weight gain or increased swelling per cardiology recommendations.  Leukocytosis, unspecified type Afebrile, repeat CBC.   No orders of the defined types were placed in this encounter.  Patient Instructions  I will repeat the blood count and kidney function test today as that was increasing previously.  I am concerned with  continuing the fluid pill that [place] have gotten worse but we will let you know the results today.  Make sure you are drinking sufficient water throughout the day.   Stop the furosemide and potassium supplement for now and then per the recommendations from cardiology do not restart that unless you gain 2 pounds in 1 day, return of shortness of breath, or leg swelling. Make sure to stand up slowly, and if any increasing or worsening dizziness be seen right away.  Recheck 1 month.   Return to the clinic or go to the nearest emergency room if any of your symptoms worsen or new symptoms occur.        Signed,   [name], MD [place] 12/30/21 3:12 PM

## 2021-12-30 NOTE — Patient Instructions (Signed)
I will repeat the blood count and kidney function test today as that was increasing previously.  I am concerned with continuing the fluid pill that [place] have gotten worse but we will let you know the results today.  Make sure you are drinking sufficient water throughout the day.   Stop the furosemide and potassium supplement for now and then per the recommendations from cardiology do not restart that unless you gain 2 pounds in 1 day, return of shortness of breath, or leg swelling. Make sure to stand up slowly, and if any increasing or worsening dizziness be seen right away.  Recheck 1 month.   Return to the clinic or go to the nearest emergency room if any of your symptoms worsen or new symptoms occur.

## 2021-12-31 ENCOUNTER — Other Ambulatory Visit: Payer: Self-pay | Admitting: Family Medicine

## 2021-12-31 DIAGNOSIS — R7989 Other specified abnormal findings of blood chemistry: Secondary | ICD-10-CM

## 2021-12-31 LAB — BASIC METABOLIC PANEL
BUN: 21 mg/dL (ref 6–23)
CO2: 27 mEq/L (ref 19–32)
Calcium: 9.4 mg/dL (ref 8.4–10.5)
Chloride: 101 mEq/L (ref 96–112)
Creatinine, Ser: 1.68 mg/dL — ABNORMAL HIGH (ref 0.40–1.50)
GFR: 40.72 mL/min — ABNORMAL LOW (ref >60.00)
Glucose, Bld: 81 mg/dL (ref 70–99)
Potassium: 4 mEq/L (ref 3.5–5.1)
Sodium: 141 mEq/L (ref 135–145)

## 2021-12-31 LAB — CBC
HCT: 36.9 % — ABNORMAL LOW (ref 39.0–52.0)
Hemoglobin: 12.5 g/dL — ABNORMAL LOW (ref 13.0–17.0)
MCHC: 33.9 g/dL (ref 30.0–36.0)
MCV: 86.3 fl (ref 78.0–100.0)
Platelets: 266 10*3/uL (ref 150.0–400.0)
RBC: 4.28 Mil/uL (ref 4.22–5.81)
RDW: 14.8 % (ref 11.5–15.5)
WBC: 8.8 10*3/uL (ref 4.0–10.5)

## 2021-12-31 NOTE — Progress Notes (Signed)
See lab note - repeat BMP in 7-10 days

## 2022-01-01 ENCOUNTER — Other Ambulatory Visit: Payer: Medicare Other

## 2022-01-01 ENCOUNTER — Ambulatory Visit: Payer: Medicare Other

## 2022-01-01 ENCOUNTER — Telehealth: Payer: Self-pay

## 2022-01-01 DIAGNOSIS — I5031 Acute diastolic (congestive) heart failure: Secondary | ICD-10-CM

## 2022-01-01 DIAGNOSIS — I25118 Atherosclerotic heart disease of native coronary artery with other forms of angina pectoris: Secondary | ICD-10-CM

## 2022-01-01 NOTE — Telephone Encounter (Signed)
Called pt back and informed.

## 2022-01-01 NOTE — Telephone Encounter (Signed)
-----   Message from Wendie Agreste, MD sent at 12/31/2021 10:47 PM EDT ----- Call patient.  Kidney function test still elevated but back to level of [place] 22.  Repeat labs in the next week to 10 days should show improvement if he has stopped the fluid pills.  Blood counts were stable and infection fighting cells are now normal.  Let me know if there are questions.

## 2022-01-03 ENCOUNTER — Encounter: Payer: Self-pay | Admitting: Family Medicine

## 2022-01-03 NOTE — Progress Notes (Signed)
Please set him up to see me sooner thant 02/05/22. Probably in 2 weeks and CC is fine as well and I will discuss with her. Let me know

## 2022-01-04 NOTE — Progress Notes (Signed)
Can you about changing this appointment please?

## 2022-01-05 NOTE — Progress Notes (Signed)
Can you about changing this appointment please?

## 2022-01-18 ENCOUNTER — Encounter: Payer: Self-pay | Admitting: Student

## 2022-01-18 ENCOUNTER — Ambulatory Visit: Payer: Medicare Other | Admitting: Student

## 2022-01-18 VITALS — BP 131/66 | HR 49 | Temp 98.0°F | Resp 16 | Ht 70.0 in | Wt 173.6 lb

## 2022-01-18 DIAGNOSIS — I25118 Atherosclerotic heart disease of native coronary artery with other forms of angina pectoris: Secondary | ICD-10-CM

## 2022-01-18 DIAGNOSIS — R931 Abnormal findings on diagnostic imaging of heart and coronary circulation: Secondary | ICD-10-CM | POA: Diagnosis not present

## 2022-01-18 DIAGNOSIS — I5032 Chronic diastolic (congestive) heart failure: Secondary | ICD-10-CM | POA: Diagnosis not present

## 2022-01-18 MED ORDER — HYDROXYZINE HCL 10 MG PO TABS: 10.0000 mg | ORAL_TABLET | Freq: Three times a day (TID) | ORAL | 0 refills | Status: DC | PRN

## 2022-01-18 NOTE — Progress Notes (Signed)
Follow up visit  Subjective:   Gilbert [name], male    DOB: Apr 04, 1951, 71 y.o.   MRN: [id number]   Chief Complaint  Patient presents with   Congestive Heart Failure   Coronary Artery Disease   Follow-up    71 y.o. Caucasian male  with CAD s/p MI, LM stenting 2015, ischemic cardiomyopathy with recovery in EF to 55% on guideline directed medical therapy, PAD s/p Left external iliac/common femoral stenting 2015, h/o left nephrectomy for renal cell carcinoma, h/o NHL, currently in remission.  Patient was last seen in our office 12/25/2021 by Dr. Einar Gip at which point acute heart failure had resolved, therefore advised patient to switch Lasix and potassium to as needed and ordered repeat echocardiogram.  Echocardiogram revealed new regional wall motion abnormalities with LVEF reduced from 50-55% to 40-45%.  Patient now presents for follow-up. Patient is without specific complaints today. Denies worsening dyspnea. However his weight has trended up about 9 lbs since last visit.   Patient is complaining of itching red rash over his upper arms and upper back.  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 Used Impella Circulatory assist device. EF= 20-25%   History of epilepsy    at least 10 years ago. 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 EF 45% 04/10/2014   Therapeutic drug monitoring    Social History   Tobacco Use   Smoking status: Former    Packs/day: 2.00    Years: 45.00    Total pack years: 90.00    Types: Cigarettes    Quit date: 08/03/2003    Years since quitting: 18.4   Smokeless tobacco: Current    Types: Snuff   Tobacco comments:    quit  smoking 10 years agop  Substance Use Topics   Alcohol use: No    Alcohol/week: 0.0 standard drinks of alcohol    Comment: quit 1980  Marital Status: Divorced  Allergies   Allergies  Allergen Reactions   Oxycodone Swelling and Other (See Comments)    Tongue and lips swell    Medications Prior to Visit:   Outpatient Medications Prior to Visit  Medication Sig Dispense Refill  acetaminophen (TYLENOL) 500 MG tablet Take 500 mg by mouth every 6 (six) hours as needed.     atorvastatin (LIPITOR) 80 MG tablet Take 1 tablet by mouth once daily 90 tablet 0   carvedilol (COREG) 12.5 MG tablet TAKE 1 TABLET BY MOUTH TWICE DAILY WITH A MEAL 180 tablet 3   clopidogrel (PLAVIX) 75 MG tablet Take 1 tablet (75 mg total) by mouth daily. 90 tablet 3   Cyanocobalamin (B-12) 5000 MCG CAPS Take 5,000 mcg by mouth every morning.      ezetimibe (ZETIA) 10 MG tablet Take 1 tablet by mouth once daily 90 tablet 0   famotidine (PEPCID) 20 MG tablet Take 20 mg by mouth 2 (two) times daily.     furosemide (LASIX) 40 MG tablet Take 1 tablet (40 mg total) by mouth daily as needed. For fluid build up or weight gain of 2 Lbs 90  tablet 1   phenytoin (DILANTIN) 300 MG ER capsule Take 1 capsule (300 mg total) by mouth daily. 90 capsule 1   potassium chloride (KLOR-CON M) 10 MEQ tablet Take 1 tablet (10 mEq total) by mouth daily as needed (With Furosemide). Take with Furosemide (Fluid Pill) 90 tablet 3   predniSONE (DELTASONE) 20 MG tablet Take 2 tablets (40 mg total) by mouth daily with breakfast. 10 tablet 0   pyridOXINE (VITAMIN B-6) 100 MG tablet Take 100 mg by mouth every morning.      sacubitril-valsartan (ENTRESTO) 49-51 MG Take 1 tablet by mouth 2 (two) times daily. 180 tablet 3   Thiamine HCl (VITAMIN B-1) 250 MG tablet Take 250 mg by mouth every morning.      No facility-administered medications prior to visit.   Final Medications at End of Visit    Current Meds  Medication Sig   acetaminophen (TYLENOL) 500 MG tablet Take 500 mg by mouth every 6 (six) hours as needed.   atorvastatin (LIPITOR) 80 MG tablet Take 1 tablet by mouth once daily   carvedilol (COREG) 12.5 MG tablet TAKE 1 TABLET BY MOUTH TWICE DAILY WITH A MEAL   clopidogrel (PLAVIX) 75 MG tablet Take 1 tablet (75 mg total) by mouth daily.   Cyanocobalamin (B-12) 5000 MCG CAPS Take 5,000 mcg by mouth every morning.    ezetimibe (ZETIA) 10 MG tablet Take 1 tablet by mouth once daily   famotidine (PEPCID) 20 MG tablet Take 20 mg by mouth 2 (two) times daily.   furosemide (LASIX) 40 MG tablet Take 1 tablet (40 mg total) by mouth daily as needed. For fluid build up or weight gain of 2 Lbs   hydrOXYzine (ATARAX) 10 MG tablet Take 1 tablet (10 mg total) by mouth 3 (three) times daily as needed for itching.   phenytoin (DILANTIN) 300 MG ER capsule Take 1 capsule (300 mg total) by mouth daily.   potassium chloride (KLOR-CON M) 10 MEQ tablet Take 1 tablet (10 mEq total) by mouth daily as needed (With Furosemide). Take with Furosemide (Fluid Pill)   predniSONE (DELTASONE) 20 MG tablet Take 2 tablets (40 mg total) by mouth daily with breakfast.   pyridOXINE  (VITAMIN B-6) 100 MG tablet Take 100 mg by mouth every morning.    sacubitril-valsartan (ENTRESTO) 49-51 MG Take 1 tablet by mouth 2 (two) times daily.   Thiamine HCl (VITAMIN B-1) 250 MG tablet Take 250 mg by mouth every morning.    Cardiovascular studies:   Coronary angiogram 06/10/2015: LM: DES stent placed on 11/27/2013 widely patent.   LAD:  Ostial and proximal LAD stent widely patent again placed on 11/27/2013. Mid to distal segment shows very mild luminal irregularity. Gives origin to several small diagonals. There is collateralization from the left to the distal right coronary artery. Also gives collaterals to the occluded mid to distal circumflex coronary artery.   Ramus intermediate: Large vessel. Midsegment of the ramus intermediate shows mild 20-30% diffuse % luminal irregularity, proximal segment shows a 40% stenosis.   Circumflex coronary artery: A small vessel. Severely diffusely diseased. Ostium has a 70-80% stenosis in some new appears to be 90% unchanged from prior angiography. Occluded in the midsegment. Collaterals of obtuse marginal is evident with excellent filling of the distal circumflex.   Right coronary artery co-dominant vessel, it is occluded after the origin of the RV branch. Type II collaterals evident from the left to the right coronary artery. The RV branch has diffuse disease, mid segment has 90% stenosis, again unchanged from prior angiography in April 2015.    Impression: Abnormal stress test due to occluded distal circumflex and right coronary artery, this is chronic total occlusion.  Lexiscan thalium stress test 06/09/2017: 1. Prognostically, this is a high risk study. Resting EKG demonstrates ectopic atrial rhythm and sinus rhythm with LVH with repolarization, cannot exclude lateral ischemia. Stress EKG is nondiagnostic for ischemia as is the pharmacologic stress test. Stress symptoms included dyspnea and dizziness. 2. There is a mild area of ischemia of small  size in the septal wall.  There is very large scar of the inferolateral segment with very mild residual ischemia. 3. Compared to the prior study dated 05/09/2015, the current study now reveals Inferior scar with mild lateral ischemia new. EF has decreaed from 30% to 17%.  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 Right common carotid artery and right external carotid artery stenosis of (<50%). Stenosis in the left internal carotid artery (16-49%).  Antegrade right vertebral artery flow. Antegrade left vertebral artery flow. Compared to the study done on 01/27/2018,  right ICA stenosis is new.  Left ICA stenosis was >50%. F/U in 6 months.  PCV ECHOCARDIOGRAM COMPLETE 01/01/2022 Mildly depressed LV systolic function with visual EF 40-45%. Left ventricle cavity is mildly dilated. Mild left ventricular hypertrophy. Doppler evidence of grade I (impaired) diastolic dysfunction, normal LAP. Left ventricle regional wall motion findings: Basal inferolateral, Basal inferior, Basal inferoseptal, Mid inferolateral, Mid inferior, Mid inferoseptal, Apical inferior, Apical septal and Apical cap hypokinesis. Mild (Grade I) mitral regurgitation. Compared to 03/11/2021 LVEF was 50-55% and now 40-45%, RWMA appear to be new, otherwise no significant change.   EKG  01/18/2022: Sinus bradycardia at a rate of 54 bpm.  Nonspecific ST abnormality, cannot exclude right lateral ischemia.  Compared EKG 12/10/2021, no PVC.   EKG 12/10/2021: Normal sinus rhythm at rate of 70 bpm with frequent ectopic atrial beats, single PVC.  Nonspecific ST abnormality, cannot exclude high lateral ischemia.  No significant change from 09/09/2021.  Laboratory Testing:      Latest Ref Rng & Units 12/30/2021    3:32 PM 12/24/2021    1:46 PM 12/21/2021    3:12 PM  CMP  Glucose 70 - 99 mg/dL 81  92  98   BUN 6 - 23 mg/dL '21  23  20   '$ Creatinine 0.40 - 1.50 mg/dL 1.68  1.78  1.67   Sodium 135 - 145 mEq/L 141  140   139   Potassium 3.5 - 5.1 mEq/L 4.0  3.6  3.9  Chloride 96 - 112 mEq/L 101  100  99   CO2 19 - 32 mEq/L '27  29  30   '$ Calcium 8.4 - 10.5 mg/dL 9.4  9.4  9.7   Total Protein 6.0 - 8.3 g/dL   6.5   Total Bilirubin 0.2 - 1.2 mg/dL   0.6   Alkaline Phos 39 - 117 U/L   143   AST 0 - 37 U/L   24   ALT 0 - 53 U/L   12       Latest Ref Rng & Units 12/30/2021    3:32 PM 12/24/2021    1:46 PM 12/21/2021    3:12 PM  CBC  WBC 4.0 - 10.5 K/uL 8.8  11.3  11.8   Hemoglobin 13.0 - 17.0 g/dL 12.5  12.3  12.8   Hematocrit 39.0 - 52.0 % 36.9  36.2  36.7   Platelets 150.0 - 400.0 K/uL 266.0  258.0  221.0    Lab Results  Component Value Date   CHOL 187 12/21/2021   HDL 65.20 12/21/2021   LDLCALC 94 12/21/2021   TRIG 135.0 12/21/2021   CHOLHDL 3 12/21/2021    HEMOGLOBIN A1C Lab Results  Component Value Date   HGBA1C 5.1 08/13/2014   MPG 100 08/13/2014   TSH No results for input(s): "TSH" in the last 8760 hours.   ROS   Review of Systems  Constitutional: Positive for weight gain.  Cardiovascular:  Positive for dyspnea on exertion. Negative for chest pain, leg swelling, orthopnea, palpitations and syncope.   Objective:      01/18/2022    1:05 PM 12/30/2021    2:18 PM 12/25/2021    1:29 PM  Vitals with BMI  Height '5\' 10"'$  '5\' 10"'$  '5\' 10"'$   Weight 173 lbs 10 oz 164 lbs 13 oz 167 lbs 3 oz  BMI 24.91 30.86 57.84  Systolic 696 295 284  Diastolic 66 78 71  Pulse 49 80 91    Today's Vitals   01/18/22 1305  BP: 131/66  Pulse: (!) 49  Resp: 16  Temp: 98 F (36.7 C)  TempSrc: Temporal  SpO2: 99%  Weight: 173 lb 9.6 oz (78.7 kg)  Height: '5\' 10"'$  (1.778 m)   Body mass index is 24.91 kg/m.    Physical Exam Vitals reviewed.  Neck:     Vascular: No JVD.  Cardiovascular:     Rate and Rhythm: Normal rate and regular rhythm.     Pulses:          Dorsalis pedis pulses are 0 on the right side and 0 on the left side.       Posterior tibial pulses are 1+ on the right side and 1+ on the left  side.     Heart sounds: Normal heart sounds. No murmur heard. Pulmonary:     Effort: Pulmonary effort is normal.     Breath sounds: Normal breath sounds.  Musculoskeletal:     Right lower leg: No edema.     Left lower leg: No edema.      Assessment and Recommendations:      ICD-10-CM   1. Coronary artery disease of native artery of native heart with stable angina pectoris ([place])  I25.118 PCV MYOCARDIAL PERFUSION WITH LEXISCAN    EKG 12-Lead    2. Regional wall motion abnormality of heart  R93.1 PCV MYOCARDIAL PERFUSION WITH LEXISCAN    3. Chronic diastolic heart failure (HCC)  I50.32       71  y.o. y/o Caucasian male with CAD s/p MI, LM stenting 2015, ischemic cardiomyopathy with recovery in EF to 55% on guideline directed medical therapy, PAD s/p Left external iliac/common femoral stenting 2015, h/o left nephrectomy for renal cell carcinoma, h/o NHL, currently in remission.  Unfortunately discussions regarding chewing tobacco not to be held as patient does not intend to quit chewing tobacco.  Patient was last seen in our office 12/25/2021 by Dr. Einar Gip at which point acute heart failure had resolved, therefore advised patient to switch Lasix and potassium to as needed and ordered repeat echocardiogram.  Echocardiogram revealed new regional wall motion abnormalities with LVEF reduced from 50-55% to 40-45%.  Patient now presents for follow-up.  Reviewed and discussed results of echocardiogram.  Given new regional wall motion abnormalities and reduction of LVEF recommend further ischemic evaluation. Discussed options with patient and Dr. Einar Gip, shared decision was to proceed with nuclear stress test, however patient is not a treadmill candidate due to history of dizziness and unsteady gait.   Notably patient's weight has also trended up approximately 9 pounds since last office visit.  Therefore advised him to take Lasix 40 mg p.o. twice daily for 3 days as well as potassium supplements  accordingly.  He can then return to taking Lasix as needed.  Follow-up in 4 to 6 weeks, sooner if needed.   Alethia Berthold, PA-C 01/18/2022, 2:06 PM Office: [phone]

## 2022-01-27 ENCOUNTER — Ambulatory Visit: Payer: Medicare Other

## 2022-01-27 DIAGNOSIS — R931 Abnormal findings on diagnostic imaging of heart and coronary circulation: Secondary | ICD-10-CM

## 2022-01-27 DIAGNOSIS — I25118 Atherosclerotic heart disease of native coronary artery with other forms of angina pectoris: Secondary | ICD-10-CM | POA: Diagnosis not present

## 2022-02-01 NOTE — Progress Notes (Signed)
I know he prefers to follow with you. Also you are seeing him next month.

## 2022-02-05 ENCOUNTER — Ambulatory Visit: Payer: Medicare Other | Admitting: Cardiology

## 2022-02-12 ENCOUNTER — Other Ambulatory Visit: Payer: Self-pay | Admitting: Cardiology

## 2022-02-12 DIAGNOSIS — L509 Urticaria, unspecified: Secondary | ICD-10-CM

## 2022-02-12 MED ORDER — HYDROXYZINE HCL 10 MG PO TABS: 10.0000 mg | ORAL_TABLET | Freq: Three times a day (TID) | ORAL | 2 refills | Status: DC | PRN

## 2022-02-28 ENCOUNTER — Other Ambulatory Visit: Payer: Self-pay | Admitting: Cardiology

## 2022-03-08 ENCOUNTER — Ambulatory Visit: Payer: Medicare Other | Admitting: Cardiology

## 2022-03-10 ENCOUNTER — Ambulatory Visit: Payer: Medicare Other | Admitting: Cardiology

## 2022-03-10 ENCOUNTER — Encounter: Payer: Self-pay | Admitting: Cardiology

## 2022-03-10 VITALS — BP 174/84 | HR 70 | Temp 97.0°F | Resp 16 | Ht 70.0 in | Wt 176.6 lb

## 2022-03-10 DIAGNOSIS — I5033 Acute on chronic diastolic (congestive) heart failure: Secondary | ICD-10-CM | POA: Diagnosis not present

## 2022-03-10 DIAGNOSIS — I10 Essential (primary) hypertension: Secondary | ICD-10-CM | POA: Diagnosis not present

## 2022-03-10 DIAGNOSIS — I25118 Atherosclerotic heart disease of native coronary artery with other forms of angina pectoris: Secondary | ICD-10-CM

## 2022-03-10 DIAGNOSIS — E7800 Pure hypercholesterolemia, unspecified: Secondary | ICD-10-CM | POA: Diagnosis not present

## 2022-03-10 DIAGNOSIS — I6523 Occlusion and stenosis of bilateral carotid arteries: Secondary | ICD-10-CM | POA: Diagnosis not present

## 2022-03-10 MED ORDER — ISOSORBIDE MONONITRATE ER 60 MG PO TB24: 60.0000 mg | ORAL_TABLET | Freq: Every day | ORAL | 3 refills | Status: DC

## 2022-03-10 NOTE — Progress Notes (Signed)
 Follow up visit  Subjective:   Gilbert [name], male    DOB: 10/06/1950, 71 y.o.   MRN: [id number]   Chief Complaint  Patient presents with   Chronic diastolic heart failure    Follow-up    6 months    71 y.o. Caucasian male  y/o Caucasian male with CAD s/p MI, LM stenting 2015, ischemic cardiomyopathy with recovery in EF to 55% on guideline directed medical therapy, PAD s/p Left external iliac/common femoral stenting 2015, h/o left nephrectomy for renal cell carcinoma, h/o NHL, currently in remission, stage IIIa chronic kidney disease, primary hypertension, hyperlipidemia presents for follow-up of heart failure and coronary artery disease.   He does not want to quit chewing tobacco.  Patient was last seen in our office 12/25/2021 with acute heart failure has resolved, therefore advised patient to switch Lasix and potassium to as needed and ordered repeat echocardiogram  Echocardiogram revealed new regional wall motion abnormalities with LVEF reduced from 50-55% to 40-45%.  Patient is presently taking 1/2 tablet of furosemide daily as he feels his legs start to swell and he gets markedly dyspneic if he does not.  He has not had any chest pain.  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 Used Impella Circulatory  assist device. EF= 20-25%   History of epilepsy    at least 10 years ago. 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 EF 45% 04/10/2014   Therapeutic drug monitoring    Social History   Tobacco Use   Smoking status: Former    Packs/day: 2.00    Years: 45.00    Total pack years: 90.00    Types: Cigarettes    Quit date: 08/03/2003    Years since quitting: 18.6   Smokeless tobacco: Current    Types: Snuff   Tobacco comments:    quit  smoking 10 years agop  Substance Use Topics   Alcohol use: No    Alcohol/week: 0.0 standard drinks of alcohol    Comment: quit 1980  Marital Status: Divorced  Allergies   Allergies  Allergen Reactions   Oxycodone Swelling and Other (See Comments)    Tongue and lips swell      Final Medications at End of Visit    Current Outpatient Medications:    acetaminophen (TYLENOL) 500 MG tablet, Take 500 mg by mouth every 6 (six) hours as needed., Disp: , Rfl:    atorvastatin (LIPITOR) 80 MG tablet, Take 1 tablet by mouth once daily, Disp: 90 tablet, Rfl: 0   carvedilol (COREG) 12.5 MG tablet, TAKE 1 TABLET BY MOUTH TWICE DAILY WITH A MEAL, Disp: 180 tablet, Rfl: 3   clopidogrel (PLAVIX) 75 MG tablet, Take 1 tablet (75 mg total) by mouth daily., Disp: 90 tablet, Rfl: 3   Cyanocobalamin (B-12) 5000 MCG CAPS, Take 5,000 mcg by mouth every morning. , Disp: , Rfl:    ezetimibe (ZETIA) 10 MG tablet, Take 1 tablet by mouth once daily, Disp: 90 tablet, Rfl: 0   famotidine (PEPCID) 20 MG tablet, Take 20 mg by mouth 2 (two)  times daily., Disp: , Rfl:    furosemide (LASIX) 40 MG tablet, Take 1 tablet (40 mg total) by mouth daily as needed. For fluid build up or weight gain of 2 Lbs, Disp: 90 tablet, Rfl: 1   hydrOXYzine (ATARAX) 10 MG tablet, Take 1 tablet (10 mg total) by mouth 3 (three) times daily as needed for itching., Disp: 30 tablet, Rfl: 2   isosorbide mononitrate (IMDUR) 60 MG 24 hr tablet, Take 1 tablet (60 mg total) by mouth daily., Disp: 90 tablet, Rfl: 3   phenytoin (DILANTIN) 300 MG ER capsule, Take 1 capsule (300 mg total) by mouth daily., Disp: 90 capsule, Rfl: 1   potassium chloride (KLOR-CON M) 10 MEQ tablet, Take 1 tablet (10 mEq total) by mouth daily as needed (With Furosemide). Take with Furosemide (Fluid Pill), Disp: 90 tablet, Rfl: 3   pyridOXINE (VITAMIN B-6) 100 MG tablet, Take 100 mg by mouth every morning. , Disp: , Rfl:    sacubitril-valsartan (ENTRESTO) 49-51 MG, Take 1 tablet by mouth 2 (two) times daily., Disp: 180 tablet, Rfl: 3   Thiamine HCl (VITAMIN B-1) 250 MG tablet, Take 250 mg by mouth every morning. , Disp: , Rfl:    Laboratory Testing:   Lab Results  Component Value Date   NA 141 12/30/2021   K 4.0 12/30/2021   CO2 27 12/30/2021   GLUCOSE 81 12/30/2021   BUN 21 12/30/2021   CREATININE 1.68 (H) 12/30/2021   CALCIUM 9.4 12/30/2021   EGFR 56 (L) 12/10/2021   GFRNONAA 59 (L) 07/11/2019       Latest Ref Rng & Units 12/30/2021    3:32 PM 12/24/2021    1:46 PM 12/21/2021    3:12 PM  CMP  Glucose 70 - 99 mg/dL 81  92  98   BUN 6 - 23 mg/dL 21  23  20   Creatinine 0.40 - 1.50 mg/dL 1.68  1.78  1.67   Sodium 135 - 145 mEq/L 141  140  139   Potassium 3.5 - 5.1 mEq/L 4.0  3.6  3.9   Chloride 96 - 112 mEq/L 101  100  99   CO2 19 - 32 mEq/L 27  29  30   Calcium 8.4 - 10.5 mg/dL 9.4  9.4  9.7   Total Protein 6.0 - 8.3 g/dL   6.5   Total Bilirubin 0.2 - 1.2 mg/dL   0.6   Alkaline Phos 39 - 117 U/L   143   AST 0 - 37 U/L   24   ALT 0 - 53 U/L   12         Latest Ref Rng &  Units 12/30/2021    3:32 PM 12/24/2021    1:46 PM 12/21/2021    3:12 PM  CBC  WBC 4.0 - 10.5 K/uL 8.8  11.3  11.8   Hemoglobin 13.0 - 17.0 g/dL 12.5  12.3  12.8   Hematocrit 39.0 - 52.0 % 36.9  36.2  36.7   Platelets 150.0 - 400.0 K/uL 266.0  258.0  221.0    Lab Results  Component Value Date   CHOL 187 12/21/2021   HDL 65.20 12/21/2021   LDLCALC 94 12/21/2021   TRIG 135.0 12/21/2021   CHOLHDL 3 12/21/2021    HEMOGLOBIN A1C Lab Results  Component Value Date   HGBA1C 5.1 08/13/2014   MPG 100 08/13/2014   TSH No results for input(s): "TSH" in the last 8760 hours.   ROS   Review of Systems  Cardiovascular:  Positive for dyspnea on exertion. Negative for chest pain, claudication, leg swelling and orthopnea.   Objective:      03/10/2022    1:58 PM 01/18/2022    1:05 PM 12/30/2021    2:18 PM  Vitals with BMI  Height 5' 10" 5' 10" 5' 10"  Weight 176 lbs 10 oz 173 lbs 10 oz 164 lbs 13 oz  BMI 25.34 53.29 92.42  Systolic 683 419 622  Diastolic 84 66 78  Pulse 70 49 80    Today's Vitals   03/10/22 1358  BP: (!) 174/84  Pulse: 70  Resp: 16  Temp: (!) 97 F (36.1 C)  TempSrc: Temporal  SpO2: 99%  Weight: 176 lb 9.6 oz (80.1 kg)  Height: 5' 10" (1.778 m)   Body mass index is 25.34 kg/m.    Physical Exam Vitals reviewed.  Neck:     Vascular: No JVD.  Cardiovascular:     Rate and Rhythm: Normal rate and regular rhythm.     Pulses:          Dorsalis pedis pulses are 0 on the right side and 0 on the left side.       Posterior tibial pulses are 1+ on the right side and 1+ on the left side.     Heart sounds: Normal heart sounds. No murmur heard. Pulmonary:     Effort: Pulmonary effort is normal.     Breath sounds: Normal breath sounds.  Musculoskeletal:     Right lower leg: No edema.     Left lower leg: No edema.    Cardiovascular studies:   Coronary angiogram 06/10/2015: LM: DES stent placed on 11/27/2013 widely patent.   LAD: Ostial and proximal LAD stent  widely patent again placed on 11/27/2013. Mid to distal segment shows very mild luminal irregularity. Gives origin to several small diagonals. There is collateralization from the left to the distal right coronary artery. Also gives collaterals to the occluded mid to distal circumflex coronary artery.   Ramus intermediate: Large vessel. Midsegment of the ramus intermediate shows mild 20-30% diffuse % luminal irregularity, proximal segment shows a 40% stenosis.   Circumflex coronary artery: A small vessel. Severely diffusely diseased. Ostium has a 70-80% stenosis in some new appears to be 90% unchanged from prior angiography. Occluded in the midsegment. Collaterals of obtuse marginal is evident with excellent filling of the distal circumflex.   Right coronary artery co-dominant vessel, it is occluded after the origin of the RV branch. Type II collaterals evident from the left to the right coronary artery. The RV branch has diffuse disease, mid segment  has 90% stenosis, again unchanged from prior angiography in April 2015.    Impression: Abnormal stress test due to occluded distal circumflex and right coronary artery, this is chronic total occlusion.  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 Right common carotid artery and right external carotid artery stenosis of (<50%). Stenosis in the left internal carotid artery (16-49%).  Antegrade right vertebral artery flow. Antegrade left vertebral artery flow. Compared to the study done on 01/27/2018,  right ICA stenosis is new.  Left ICA stenosis was >50%. F/U in 6 months.  PCV ECHOCARDIOGRAM COMPLETE 01/01/2022 Mildly depressed LV systolic function with visual EF 40-45%. Left ventricle cavity is mildly dilated. Mild left ventricular hypertrophy. Doppler evidence of grade I (impaired) diastolic dysfunction, normal LAP. Left ventricle regional wall motion findings: Basal inferolateral, Basal inferior, Basal inferoseptal, Mid  inferolateral, Mid inferior, Mid inferoseptal, Apical inferior, Apical septal and Apical cap hypokinesis. Mild (Grade I) mitral regurgitation. Compared to 03/11/2021 LVEF was 50-55% and now 40-45%, RWMA appear to be new, otherwise no significant change.   PCV MYOCARDIAL PERFUSION WITH LEXISCAN 01/27/2022  Narrative Lexiscan Tetrofosmin stress test 01/27/2022: 1 Day Rest/Stress protocol. Stress EKG is non-diagnostic due to baseline ST-T changes and target heart rate not achieved. Medium size, moderate to severe intensity, fixed perfusion defect involving the RCA/LCx distribution likely suggestive of infarct. Small size, mild intensity, reversible perfusion defect involving the apical septal segment. Left ventricular size dilated, reduced wall thickness, severe global hypokinesis with regional wall motion abnormalities associated with the perfusion defect, calculated LVEF 24%. Prior study 06/09/2017 noted large size scar along the inferolateral segment and mild area of ischemia in septal wall. LVEF was 17%. High risk study.   EKG   EKG 03/10/2022: Possibly ectopic atrial rhythm with short PR interval, cannot exclude inferior infarct old.  Anteroseptal infarct old.  Nonspecific T abnormality.  Compared to 01/18/2022, lateral ST depression with T wave inversion not present.  Assessment and Recommendations:      ICD-10-CM   1. Coronary artery disease of native artery of native heart with stable angina pectoris (HCC)  I25.118 EKG 12-Lead    CBC    isosorbide mononitrate (IMDUR) 60 MG 24 hr tablet    2. Acute on chronic diastolic heart failure (HCC)  I50.33 Pro b natriuretic peptide (BNP)    3. Essential hypertension  I10     4. Bilateral carotid artery stenosis  I65.23     5. Pure hypercholesterolemia  E78.00 Lipid Panel With LDL/HDL Ratio    LDL cholesterol, direct     Gilbert [name] is a 71 y.o. y/o Caucasian male with CAD s/p MI, LM stenting 2015, ischemic cardiomyopathy with recovery in EF  to 55% on guideline directed medical therapy, PAD s/p Left external iliac/common femoral stenting 2015, h/o left nephrectomy for renal cell carcinoma, h/o NHL, currently in remission, stage IIIa chronic kidney disease, primary hypertension, hyperlipidemia presents for follow-up of heart failure and coronary artery disease.   He does not want to quit chewing tobacco.  Patient was last seen in our office 12/25/2021 with acute heart failure has resolved, therefore advised patient to switch Lasix and potassium to as needed and ordered repeat echocardiogram  Echocardiogram revealed new regional wall motion abnormalities with LVEF reduced from 50-55% to 40-45%.  Patient is presently taking 1/2 tablet of furosemide daily as he feels his legs start to swell and he gets markedly dyspneic if he does not.  He has not had any chest pain.    I reviewed the results of the nuclear stress test.  Extremely difficult representation in view of wall motion abnormality and presence of ischemia and reduced LVEF.  He has left main stent as well.  He Gilbert [name] have restenosis in the same.  In view of this I have recommended to proceed with cardiac catheterization.  His lipid profile testing and also proBNP elevated his baseline as he is presently does not appear to be floridly in volume overload state although he still is not mild heart failure as evidenced by symptoms.  His LDL is not at goal and we Soderlund have to consider PCSK9 inhibitors as he is presently on maximum dose of atorvastatin along with Zetia.  Blood pressure is elevated, however I would like to wait before making any changes to his medications to proceed with cardiac catheterization.  Suspect elevated blood pressure is probably an error as blood pressure previously has been very well-controlled.  Schedule for cardiac catheterization, and possible angioplasty. We discussed regarding risks, benefits, alternatives to this including stress testing, CTA and continued medical  therapy. Patient wants to proceed. Understands <1-2% risk of death, stroke, MI, urgent CABG, bleeding, infection, renal failure but not limited to these.  Is a 38-minute office visit encounter with discussions regarding cardiac catheterization, review of echocardiogram and nuclear stress test.  Patient has not been able to get phenytoin as it is in short supply. He took his last dose today. This I think was started after his cardiac arrest in 2015.    Izek Corvino, MD, FACC 03/10/2022, 9:54 PM Office: [phone] Fax: [phone] Pager: [phone]  

## 2022-03-11 ENCOUNTER — Telehealth: Payer: Self-pay | Admitting: Family Medicine

## 2022-03-11 ENCOUNTER — Ambulatory Visit (INDEPENDENT_AMBULATORY_CARE_PROVIDER_SITE_OTHER): Payer: Medicare Other

## 2022-03-11 DIAGNOSIS — Z0000 Encounter for general adult medical examination without abnormal findings: Secondary | ICD-10-CM | POA: Diagnosis not present

## 2022-03-11 LAB — CBC
Hematocrit: 38.7 % (ref 37.5–51.0)
Hemoglobin: 13.7 g/dL (ref 13.0–17.7)
MCH: 29.7 pg (ref 26.6–33.0)
MCHC: 35.4 g/dL (ref 31.5–35.7)
MCV: 84 fL (ref 79–97)
Platelets: 193 10*3/uL (ref 150–450)
RBC: 4.62 x10E6/uL (ref 4.14–5.80)
RDW: 12.9 % (ref 11.6–15.4)
WBC: 7.3 10*3/uL (ref 3.4–10.8)

## 2022-03-11 LAB — LIPID PANEL WITH LDL/HDL RATIO
Cholesterol, Total: 193 mg/dL (ref 100–199)
HDL: 67 mg/dL (ref >39)
LDL Chol Calc (NIH): 103 mg/dL — ABNORMAL HIGH (ref 0–99)
LDL/HDL Ratio: 1.5 ratio (ref 0.0–3.6)
Triglycerides: 132 mg/dL (ref 0–149)
VLDL Cholesterol Cal: 23 mg/dL (ref 5–40)

## 2022-03-11 LAB — PRO B NATRIURETIC PEPTIDE: NT-Pro BNP: 11266 pg/mL — ABNORMAL HIGH (ref 0–376)

## 2022-03-11 LAB — LDL CHOLESTEROL, DIRECT: LDL Direct: 99 mg/dL (ref 0–99)

## 2022-03-11 NOTE — Progress Notes (Signed)
Subjective:   Gilbert [name] is a 71 y.o. male who presents for an Initial Medicare Annual Wellness Visit.   I connected with Gilbert [name]  today by telephone and verified that I am speaking with the correct person using two identifiers. Location patient: home Location provider: work Persons participating in the virtual visit: patient, provider.   I discussed the limitations, risks, security and privacy concerns of performing an evaluation and management service by telephone and the availability of in person appointments. I also discussed with the patient that there Rominger be a patient responsible charge related to this service. The patient expressed understanding and verbally consented to this telephonic visit.    Interactive audio and video telecommunications were attempted between this provider and patient, however failed, due to patient having technical difficulties OR patient did not have access to video capability.  We continued and completed visit with audio only.    Review of Systems     Cardiac Risk Factors include: advanced age (>72mn, >>56women);hypertension;male gender     Objective:    Today's Vitals   There is no height or weight on file to calculate BMI.     03/11/2022    3:48 PM 06/22/2021    1:42 PM 07/11/2019    2:04 PM 03/07/2019    3:34 PM 07/21/2018    9:35 AM 05/23/2018    9:33 AM 03/07/2018    3:29 PM  Advanced Directives  Does Patient Have a Medical Advance Directive? Yes Yes Yes Yes Yes Yes Yes  Type of AParamedicof [place]. [place]Living will  [place]Living will [place]Living will [place]Living will [place]Living will [place]Living will  Does patient want to make changes to medical advance directive?  No - Patient declined No - Patient declined      Copy of HWakein Chart? No - copy requested  No - copy requested    No - copy requested   Would patient like information on creating a medical advance directive?   No - Patient declined        Current Medications (verified) Outpatient Encounter Medications as of 03/11/2022  Medication Sig   acetaminophen (TYLENOL) 500 MG tablet Take 500 mg by mouth every 6 (six) hours as needed.   atorvastatin (LIPITOR) 80 MG tablet Take 1 tablet by mouth once daily   carvedilol (COREG) 12.5 MG tablet TAKE 1 TABLET BY MOUTH TWICE DAILY WITH A MEAL   clopidogrel (PLAVIX) 75 MG tablet Take 1 tablet (75 mg total) by mouth daily.   Cyanocobalamin (B-12) 5000 MCG CAPS Take 5,000 mcg by mouth every morning.    famotidine (PEPCID) 20 MG tablet Take 20 mg by mouth 2 (two) times daily.   furosemide (LASIX) 40 MG tablet Take 1 tablet (40 mg total) by mouth daily as needed. For fluid build up or weight gain of 2 Lbs   hydrOXYzine (ATARAX) 10 MG tablet Take 1 tablet (10 mg total) by mouth 3 (three) times daily as needed for itching.   phenytoin (DILANTIN) 300 MG ER capsule Take 1 capsule (300 mg total) by mouth daily.   potassium chloride (KLOR-CON M) 10 MEQ tablet Take 1 tablet (10 mEq total) by mouth daily as needed (With Furosemide). Take with Furosemide (Fluid Pill)   pyridOXINE (VITAMIN B-6) 100 MG tablet Take 100 mg by mouth every morning.    sacubitril-valsartan (ENTRESTO) 49-51 MG Take 1 tablet  by mouth 2 (two) times daily.   Thiamine HCl (VITAMIN B-1) 250 MG tablet Take 250 mg by mouth every morning.    ezetimibe (ZETIA) 10 MG tablet Take 1 tablet by mouth once daily (Patient not taking: Reported on 03/11/2022)   isosorbide mononitrate (IMDUR) 60 MG 24 hr tablet Take 1 tablet (60 mg total) by mouth daily. (Patient not taking: Reported on 03/11/2022)   No facility-administered encounter medications on file as of 03/11/2022.    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 Used Impella Circulatory assist device. EF= 20-25%   History of epilepsy    at least 10 years ago. 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 EF 45% 04/10/2014   Therapeutic drug monitoring    Past Surgical History:  Procedure Laterality Date   BALLOON DILATION N/A 01/21/2015   Procedure: BALLOON DILATION;  Surgeon: Gatha Mayer, MD;  Location: [place];  Service: Endoscopy;  Laterality: N/A;   CARDIAC CATHETERIZATION N/A 06/10/2015   Procedure: Left Heart Cath and Coronary Angiography;  Surgeon: Adrian Prows, MD;  Location: [facility];  Service: Cardiovascular;  Laterality: N/A;   CORONARY ANGIOPLASTY WITH STENT PLACEMENT  08/02/2013   Left main coronary artery stenting on emergent basis along with circulatory support with Impella device through left leg. With 4.0 x 18 mm Xience alpine stent   EAR CYST EXCISION Left  05/31/2018   Procedure: EXCISION LEFT POSTERIOR EAR TUMOR;  Surgeon: Leta Baptist, MD;  Location[facility];  Service: ENT;  Laterality: Left;   ESOPHAGOGASTRODUODENOSCOPY     ESOPHAGOGASTRODUODENOSCOPY N/A 01/21/2015   Procedure: ESOPHAGOGASTRODUODENOSCOPY (EGD) with possible dilation.;  Surgeon: Gatha Mayer, MD;  Location: [facility];  Service: Endoscopy;  Laterality: N/A;   EYE SURGERY Left 2 weeks ago   torn retina   ILIAC ARTERY STENT Left 05/28/2014   dr Einar Gip   LEFT HEART CATHETERIZATION WITH CORONARY ANGIOGRAM N/A 11/27/2013   Procedure: LEFT HEART CATHETERIZATION WITH CORONARY ANGIOGRAM;  Surgeon: [name], MD;  [facility] C[facility] LAB;  Service: Cardiovascular;  Laterality: N/A;   LOWER EXTREMITY ANGIOGRAM N/A 02/26/2014   Procedure:  LOWER EXTREMITY ANGIOGRAM;  Surgeon: [name], MD;  Location: [facility] CATH LAB;  Service: Cardiovascular;  Laterality: N/A;   LOWER EXTREMITY ANGIOGRAM N/A 05/28/2014   Procedure: LOWER EXTREMITY ANGIOGRAM;  Surgeon: [name], MD;  Location: [facility] C[facility] LAB;  Service: Cardiovascular;  Laterality: N/A;   NEPHRECTOMY  11/30/2013   PERCUTANEOUS CORONARY STENT INTERVENTION (PCI-S)  11/27/2013   Procedure: PERCUTANEOUS CORONARY STENT INTERVENTION (PCI-S);  Surgeon: [name], MD;  Location[facility] CATH LAB;  Service: Cardiovascular;;   ROBOT ASSISTED LAPAROSCOPIC NEPHRECTOMY Right 10/19/2013   Procedure: ROBOTIC ASSISTED LAPAROSCOPIC NEPHRECTOMY,  EXTENSIVE ADHESIOLYSIS;  Surgeon: Alexis Frock, MD;  Location: [facility];  Service: Urology;  Laterality: Right;   SKIN FULL THICKNESS GRAFT Left 05/31/2018   Procedure: SKIN GRAFT FULL THICKNESS FROM LEFT ABDOMEN TO LEFT EAR;  Surgeon: Leta Baptist, MD;  Location: [facility];  Service: ENT;  Laterality: Left;   Family History  Problem Relation Age of Onset   Stroke Mother    Emphysema Father    Heart disease Brother        multiple stents   Hyperlipidemia Brother    Stomach cancer Maternal Grandmother     Colon cancer Neg Hx    Colon polyps Neg Hx    Esophageal cancer Neg Hx    Rectal cancer Neg Hx    Social History   Socioeconomic History   Marital status: Divorced    Spouse name: Not on file   Number of children: 2   Years of education: Not on file   Highest education level: Not on file  Occupational History   Occupation: retired  Tobacco Use   Smoking status: Former    Packs/day: 2.00    Years: 45.00    Total pack years: 90.00    Types: Cigarettes    Quit date: 08/03/2003    Years since quitting: 18.6   Smokeless tobacco: Current    Types: Snuff   Tobacco comments:    quit  smoking 10 years agop  Vaping Use   Vaping Use: Never used  Substance and Sexual Activity   Alcohol use: No    Alcohol/week: 0.0 standard drinks of alcohol    Comment: quit 1980   Drug use: No    Comment: none in past 30 years    Sexual activity: Not Currently  Other Topics Concern   Not on file  Social History Narrative   ** Merged History Encounter **       Social Determinants of Health   Financial Resource Strain: Low Risk  (03/11/2022)   Overall Financial Resource Strain (CARDIA)    Difficulty of Paying Living Expenses: Not hard at all  Food Insecurity: No Food Insecurity (03/11/2022)   Hunger Vital Sign    Worried About Running Out of Food in the Last Year: Never true    Ran Out of Food in the Last Year: Never true  Transportation Needs: No Transportation Needs (03/11/2022)   PRAPARE - Hydrologist (Medical): No    Lack of Transportation (Non-Medical): No  Physical Activity: Insufficiently Active (03/11/2022)   Exercise Vital Sign    Days of Exercise per Week: 3 days    Minutes of Exercise per Session: 30 min  Stress: No Stress Concern Present (03/11/2022)   [place]    Feeling of Stress : Not at all  Social Connections: Socially Isolated (03/11/2022)   Social Connection and Isolation  Panel [  NHANES]    Frequency of Communication with Friends and Family: Three times a week    Frequency of Social Gatherings with Friends and Family: Three times a week    Attends Religious Services: Never    Active Member of Clubs or Organizations: No    Attends Archivist Meetings: Never    Marital Status: Divorced    Tobacco Counseling Ready to quit: Not Answered Counseling given: Not Answered Tobacco comments: quit  smoking 10 years agop   Clinical Intake:  Pre-visit preparation completed: Yes  Pain : No/denies pain     Nutritional Risks: None Diabetes: No  How often do you need to have someone help you when you read instructions, pamphlets, or other written materials from your doctor or pharmacy?: 1 - Never What is the last grade level you completed in school?: 9th grade  Diabetic?no  Interpreter Needed?: No  Information entered by :: L.Wilson,LPN   Activities of Daily Living    03/11/2022    3:54 PM 03/11/2022    3:41 PM  In your present state of health, do you have any difficulty performing the following activities:  Hearing? 0 0  Vision? 0 0  Difficulty concentrating or making decisions? 0 0  Walking or climbing stairs? 0 0  Dressing or bathing? 0 0  Doing errands, shopping? 0 0  Preparing Food and eating ? N N  Using the Toilet? N N  In the past six months, have you accidently leaked urine? N N  Do you have problems with loss of bowel control? N N  Managing your Medications? N N  Managing your Finances? N N  Housekeeping or managing your Housekeeping? N N    Patient Care Team: Wendie Agreste, MD as PCP - General (Family Medicine) Everlene Farrier Loura Back, MD (Family Medicine) Adrian Prows, MD as Referring Physician (Cardiology) Susa Day, MD as Consulting Physician (Orthopedic Surgery)  Indicate any recent Medical Services you Liou have received from other than [facility] providers in the past year (date [place] be approximate).     Assessment:    This is a routine wellness examination for Gilbert Saxon.  Hearing/Vision screen Vision Screening - Comments:: Annual eye exams wears glasses   Dietary issues and exercise activities discussed: Current Exercise Habits: Home exercise routine, Type of exercise: walking, Time (Minutes): 30, Frequency (Times/Week): 3, Weekly Exercise (Minutes/Week): 90, Intensity: Mild, Exercise limited by: None identified   Goals Addressed   None    Depression Screen    03/11/2022    3:53 PM 03/11/2022    3:41 PM 12/10/2021   11:33 AM 10/08/2021   11:43 AM 06/22/2021    1:40 PM 06/20/2020   10:05 AM 05/04/2019    1:55 PM  PHQ 2/9 Scores  PHQ - 2 Score 0 0 0 1  0 0  Exception Documentation     Patient refusal      Fall Risk    03/11/2022    3:49 PM 12/10/2021   11:33 AM 06/22/2021    1:40 PM 06/20/2020   10:05 AM 05/04/2019    1:55 PM  Fall Risk   Falls in the past year? 0 0 0 0 0  Number falls in past yr: 0 0 0 0 0  Injury with Fall? 0 0 0 0 0  Risk for fall due to :  No Fall Risks No Fall Risks    Follow up Falls evaluation completed;Education provided Falls evaluation completed Falls evaluation completed Falls evaluation completed Falls evaluation completed  FALL RISK PREVENTION PERTAINING TO THE HOME:  Any stairs in or around the home? Yes  If so, are there any without handrails? No  Home free of loose throw rugs in walkways, pet beds, electrical cords, etc? Yes  Adequate lighting in your home to reduce risk of falls? Yes   ASSISTIVE DEVICES UTILIZED TO PREVENT FALLS:  Life alert? No  Use of a cane, walker or w/c? No  Grab bars in the bathroom? No  Shower chair or bench in shower? No  Elevated toilet seat or a handicapped toilet? No     Cognitive Function:    Normal cognitive status assessed by telephone conversation by this Nurse Health Advisor. No abnormalities found.      03/11/2022    3:54 PM 06/22/2021    1:40 PM  6CIT Screen  What Year? 0 points 0 points  What month? 0  points 0 points  What time? 0 points 0 points  Count back from 20 0 points 0 points  Months in reverse 0 points 0 points  Repeat phrase 0 points 0 points  Total Score 0 points 0 points    Immunizations Immunization History  Administered Date(s) Administered   Influenza, High Dose Seasonal PF 05/26/2017   PFIZER(Purple Top)SARS-COV-2 Vaccination 09/22/2019, 10/17/2019   Pneumococcal Conjugate-13 05/26/2017   Pneumococcal Polysaccharide-23 01/23/2015, 12/24/2021    TDAP status: Due, Education has been provided regarding the importance of this vaccine. Advised Villella receive this vaccine at local pharmacy or Health Dept. Aware to provide a copy of the vaccination record if obtained from local pharmacy or Health Dept. Verbalized acceptance and understanding.  Flu Vaccine status: Up to date  Pneumococcal vaccine status: Up to date  Covid-19 vaccine status: Completed vaccines  Qualifies for Shingles Vaccine? Yes   Zostavax completed Yes   Shingrix Completed?: No.    Education has been provided regarding the importance of this vaccine. Patient has been advised to call insurance company to determine out of pocket expense if they have not yet received this vaccine. Advised Deloria also receive vaccine at local pharmacy or Health Dept. Verbalized acceptance and understanding.  Screening Tests Health Maintenance  Topic Date Due   Zoster Vaccines- Shingrix (1 of 2) Never done   COVID-19 Vaccine (3 - Pfizer risk series) 11/14/2019   TETANUS/TDAP  12/11/2022 (Originally 10/21/1969)   Pneumonia Vaccine 54+ Years old  Completed   Hepatitis C Screening  Completed   HPV VACCINES  Aged Out   INFLUENZA VACCINE  Discontinued   COLONOSCOPY (Pts 45-46yr Insurance coverage will need to be confirmed)  Discontinued   Fecal DNA (Cologuard)  Discontinued    Health Maintenance  Health Maintenance Due  Topic Date Due   Zoster Vaccines- Shingrix (1 of 2) Never done   COVID-19 Vaccine (3 - Pfizer risk  series) 11/14/2019    Colorectal cancer screening: Type of screening: Colonoscopy. Completed 04/16/2021. Repeat every 10 years  Lung Cancer Screening: (Low Dose CT Chest recommended if Age 71-80years, 30 pack-year currently smoking OR have quit w/in 15years.) does not qualify.   Lung Cancer Screening Referral: n/a  Additional Screening:  Hepatitis C Screening: does not qualify;  Vision Screening: Recommended annual ophthalmology exams for early detection of glaucoma and other disorders of the eye. Is the patient up to date with their annual eye exam?  Yes  Who is the provider or what is the name of the office in which the patient attends annual eye exams? Dr.Groat  If pt is not  established with a provider, would they like to be referred to a provider to establish care? No .   Dental Screening: Recommended annual dental exams for proper oral hygiene  Community Resource Referral / Chronic Care Management: CRR required this visit?  No   CCM required this visit?  No      Plan:     I have personally reviewed and noted the following in the patient's chart:   Medical and social history Use of alcohol, tobacco or illicit drugs  Current medications and supplements including opioid prescriptions. Patient is not currently taking opioid prescriptions. Functional ability and status Nutritional status Physical activity Advanced directives List of other physicians Hospitalizations, surgeries, and ER visits in previous 12 months Vitals Screenings to include cognitive, depression, and falls Referrals and appointments  In addition, I have reviewed and discussed with patient certain preventive protocols, quality metrics, and best practice recommendations. A written personalized care plan for preventive services as well as general preventive health recommendations were provided to patient.     Daphane Shepherd, LPN   [id number]   Nurse Notes: none

## 2022-03-11 NOTE — Telephone Encounter (Signed)
Encourage patient to contact the pharmacy for refills or they can request refills through [facility]  (Please schedule appointment if patient has not been seen in over a year)    [place] TO: [place] (SE), [place] - [place] NAME & DOSE: Phenytoin '300mg'$   NOTES/COMMENTS FROM PATIENT: Pt states that he hasn't had a pill in three days. Pt also states all the pharmacies in [place] are out of stock. Pt want to know if Dr.Greene can send another medication that is equivalent to this medication.      [place] office please notify patient: It takes 48-72 hours to process rx refill requests Ask patient to call pharmacy to ensure rx is ready before heading there.

## 2022-03-11 NOTE — Patient Instructions (Signed)
Gilbert [name] , Thank you for taking time to come for your Medicare Wellness Visit. I appreciate your ongoing commitment to your health goals. Please review the following plan we discussed and let me know if I can assist you in the future.   Screening recommendations/referrals: Colonoscopy: 04/16/2021 Recommended yearly ophthalmology/optometry visit for glaucoma screening and checkup Recommended yearly dental visit for hygiene and checkup  Vaccinations: Influenza vaccine: completed  Pneumococcal vaccine: completed  Tdap vaccine: due  Shingles vaccine: will consider   Advanced directives: none   Conditions/risks identified: none   Next appointment: none   Preventive Care 71 Years and Older, Male Preventive care refers to lifestyle choices and visits with your health care provider that can promote health and wellness. What does preventive care include? A yearly physical exam. This is also called an annual well check. Dental exams once or twice a year. Routine eye exams. Ask your health care provider how often you should have your eyes checked. Personal lifestyle choices, including: Daily care of your teeth and gums. Regular physical activity. Eating a healthy diet. Avoiding tobacco and drug use. Limiting alcohol use. Practicing safe sex. Taking low doses of aspirin every day. Taking vitamin and mineral supplements as recommended by your health care provider. What happens during an annual well check? The services and screenings done by your health care provider during your annual well check will depend on your age, overall health, lifestyle risk factors, and family history of disease. Counseling  Your health care provider Cadet ask you questions about your: Alcohol use. Tobacco use. Drug use. Emotional well-being. Home and relationship well-being. Sexual activity. Eating habits. History of falls. Memory and ability to understand (cognition). Work and work Statistician. Screening   You Sees have the following tests or measurements: Height, weight, and BMI. Blood pressure. Lipid and cholesterol levels. These Horen be checked every 5 years, or more frequently if you are over 71 years old. Skin check. Lung cancer screening. You Leazer have this screening every year starting at age 71 if you have a 30-pack-year history of smoking and currently smoke or have quit within the past 15 years. Fecal occult blood test (FOBT) of the stool. You Buckel have this test every year starting at age 71. Flexible sigmoidoscopy or colonoscopy. You Baba have a sigmoidoscopy every 5 years or a colonoscopy every 10 years starting at age 71. Prostate cancer screening. Recommendations will vary depending on your family history and other risks. Hepatitis C blood test. Hepatitis B blood test. Sexually transmitted disease (STD) testing. Diabetes screening. This is done by checking your blood sugar (glucose) after you have not eaten for a while (fasting). You Lull have this done every 1-3 years. Abdominal aortic aneurysm (AAA) screening. You Gainer need this if you are a current or former smoker. Osteoporosis. You Tupy be screened starting at age 41 if you are at high risk. Talk with your health care provider about your test results, treatment options, and if necessary, the need for more tests. Vaccines  Your health care provider Renault recommend certain vaccines, such as: Influenza vaccine. This is recommended every year. Tetanus, diphtheria, and acellular pertussis (Tdap, Td) vaccine. You Parlier need a Td booster every 10 years. Zoster vaccine. You Corsino need this after age 71. Pneumococcal 13-valent conjugate (PCV13) vaccine. One dose is recommended after age 3. Pneumococcal polysaccharide (PPSV23) vaccine. One dose is recommended after age 43. Talk to your health care provider about which screenings and vaccines you need and how often you need  them. This information is not intended to replace advice given to you by  your health care provider. Make sure you discuss any questions you have with your health care provider. Document Released: 08/15/2015 Document Revised: 04/07/2016 Document Reviewed: 05/20/2015 Elsevier Interactive Patient Education  2017 [place] Prevention in the Home Falls can cause injuries. They can happen to people of all ages. There are many things you can do to make your home safe and to help prevent falls. What can I do on the outside of my home? Regularly fix the edges of walkways and driveways and fix any cracks. Remove anything that might make you trip as you walk through a door, such as a raised step or threshold. Trim any bushes or trees on the path to your home. Use bright outdoor lighting. Clear any walking paths of anything that might make someone trip, such as rocks or tools. Regularly check to see if handrails are loose or broken. Make sure that both sides of any steps have handrails. Any raised decks and porches should have guardrails on the edges. Have any leaves, snow, or ice cleared regularly. Use sand or salt on walking paths during winter. Clean up any spills in your garage right away. This includes oil or grease spills. What can I do in the bathroom? Use night lights. Install grab bars by the toilet and in the tub and shower. Do not use towel bars as grab bars. Use non-skid mats or decals in the tub or shower. If you need to sit down in the shower, use a plastic, non-slip stool. Keep the floor dry. Clean up any water that spills on the floor as soon as it happens. Remove soap buildup in the tub or shower regularly. Attach bath mats securely with double-sided non-slip rug tape. Do not have throw rugs and other things on the floor that can make you trip. What can I do in the bedroom? Use night lights. Make sure that you have a light by your bed that is easy to reach. Do not use any sheets or blankets that are too big for your bed. They should not hang  down onto the floor. Have a firm chair that has side arms. You can use this for support while you get dressed. Do not have throw rugs and other things on the floor that can make you trip. What can I do in the kitchen? Clean up any spills right away. Avoid walking on wet floors. Keep items that you use a lot in easy-to-reach places. If you need to reach something above you, use a strong step stool that has a grab bar. Keep electrical cords out of the way. Do not use floor polish or wax that makes floors slippery. If you must use wax, use non-skid floor wax. Do not have throw rugs and other things on the floor that can make you trip. What can I do with my stairs? Do not leave any items on the stairs. Make sure that there are handrails on both sides of the stairs and use them. Fix handrails that are broken or loose. Make sure that handrails are as long as the stairways. Check any carpeting to make sure that it is firmly attached to the stairs. Fix any carpet that is loose or worn. Avoid having throw rugs at the top or bottom of the stairs. If you do have throw rugs, attach them to the floor with carpet tape. Make sure that you have a light switch at  the top of the stairs and the bottom of the stairs. If you do not have them, ask someone to add them for you. What else can I do to help prevent falls? Wear shoes that: Do not have high heels. Have rubber bottoms. Are comfortable and fit you well. Are closed at the toe. Do not wear sandals. If you use a stepladder: Make sure that it is fully opened. Do not climb a closed stepladder. Make sure that both sides of the stepladder are locked into place. Ask someone to hold it for you, if possible. Clearly mark and make sure that you can see: Any grab bars or handrails. First and last steps. Where the edge of each step is. Use tools that help you move around (mobility aids) if they are needed. These  include: Canes. Walkers. Scooters. Crutches. Turn on the lights when you go into a dark area. Replace any light bulbs as soon as they burn out. Set up your furniture so you have a clear path. Avoid moving your furniture around. If any of your floors are uneven, fix them. If there are any pets around you, be aware of where they are. Review your medicines with your doctor. Some medicines can make you feel dizzy. This can increase your chance of falling. Ask your doctor what other things that you can do to help prevent falls. This information is not intended to replace advice given to you by your health care provider. Make sure you discuss any questions you have with your health care provider. Document Released: 05/15/2009 Document Revised: 12/25/2015 Document Reviewed: 08/23/2014 Elsevier Interactive Patient Education  2017 [employer].

## 2022-03-12 MED ORDER — PHENYTOIN SODIUM EXTENDED 100 MG PO CAPS
300.0000 mg | ORAL_CAPSULE | Freq: Every day | ORAL | 1 refills | Status: DC
Start: 2022-03-12 — End: 2022-05-03

## 2022-03-12 NOTE — Telephone Encounter (Signed)
Pt was called and informed

## 2022-03-12 NOTE — Telephone Encounter (Signed)
If they are just out of the 300 mg dose, I sent in the 100 mg dose - 3 per day.   Can check other pharmacies to see if they have that dosage if not available at his pharmacy.  Let me know if I need to help further.

## 2022-03-12 NOTE — Telephone Encounter (Signed)
Pt notes pharmacies are out of stock and he is requesting an alternative

## 2022-03-23 ENCOUNTER — Ambulatory Visit (HOSPITAL_COMMUNITY)
Admission: RE | Disposition: A | Discharge status: Home / Self Care | Payer: Self-pay | Source: Home / Self Care | Attending: Cardiology

## 2022-03-23 ENCOUNTER — Ambulatory Visit (HOSPITAL_COMMUNITY)
Admission: RE | Admit: 2022-03-23 | Discharge: 2022-03-23 | Disposition: A | Discharge status: Home / Self Care | Payer: Medicare Other | Attending: Cardiology | Admitting: Cardiology

## 2022-03-23 ENCOUNTER — Encounter: Payer: Self-pay | Admitting: Cardiology

## 2022-03-23 ENCOUNTER — Other Ambulatory Visit: Payer: Self-pay

## 2022-03-23 DIAGNOSIS — Z905 Acquired absence of kidney: Secondary | ICD-10-CM | POA: Diagnosis not present

## 2022-03-23 DIAGNOSIS — E785 Hyperlipidemia, unspecified: Secondary | ICD-10-CM | POA: Insufficient documentation

## 2022-03-23 DIAGNOSIS — I25118 Atherosclerotic heart disease of native coronary artery with other forms of angina pectoris: Secondary | ICD-10-CM | POA: Diagnosis not present

## 2022-03-23 DIAGNOSIS — I130 Hypertensive heart and chronic kidney disease with heart failure and stage 1 through stage 4 chronic kidney disease, or unspecified chronic kidney disease: Secondary | ICD-10-CM | POA: Insufficient documentation

## 2022-03-23 DIAGNOSIS — I252 Old myocardial infarction: Secondary | ICD-10-CM | POA: Diagnosis not present

## 2022-03-23 DIAGNOSIS — E7800 Pure hypercholesterolemia, unspecified: Secondary | ICD-10-CM | POA: Diagnosis not present

## 2022-03-23 DIAGNOSIS — I5033 Acute on chronic diastolic (congestive) heart failure: Secondary | ICD-10-CM | POA: Diagnosis not present

## 2022-03-23 DIAGNOSIS — F17290 Nicotine dependence, other tobacco product, uncomplicated: Secondary | ICD-10-CM | POA: Insufficient documentation

## 2022-03-23 DIAGNOSIS — I6523 Occlusion and stenosis of bilateral carotid arteries: Secondary | ICD-10-CM | POA: Insufficient documentation

## 2022-03-23 DIAGNOSIS — I5021 Acute systolic (congestive) heart failure: Secondary | ICD-10-CM | POA: Diagnosis not present

## 2022-03-23 DIAGNOSIS — N1831 Chronic kidney disease, stage 3a: Secondary | ICD-10-CM | POA: Diagnosis not present

## 2022-03-23 DIAGNOSIS — I255 Ischemic cardiomyopathy: Secondary | ICD-10-CM | POA: Insufficient documentation

## 2022-03-23 DIAGNOSIS — Z955 Presence of coronary angioplasty implant and graft: Secondary | ICD-10-CM | POA: Diagnosis not present

## 2022-03-23 HISTORY — PX: RIGHT/LEFT HEART CATH AND CORONARY ANGIOGRAPHY: CATH118266

## 2022-03-23 LAB — POCT I-STAT 7, (LYTES, BLD GAS, ICA,H+H)
Acid-base deficit: 2 mmol/L (ref 0.0–2.0)
Bicarbonate: 22.6 mmol/L (ref 20.0–28.0)
Calcium, Ion: 1.34 mmol/L (ref 1.15–1.40)
HCT: 32 % — ABNORMAL LOW (ref 39.0–52.0)
Hemoglobin: 10.9 g/dL — ABNORMAL LOW (ref 13.0–17.0)
O2 Saturation: 98 %
Potassium: 4 mmol/L (ref 3.5–5.1)
Sodium: 140 mmol/L (ref 135–145)
TCO2: 24 mmol/L (ref 22–32)
pCO2 arterial: 38.5 mmHg (ref 32–48)
pH, Arterial: 7.377 (ref 7.35–7.45)
pO2, Arterial: 111 mmHg — ABNORMAL HIGH (ref 83–108)

## 2022-03-23 LAB — POCT I-STAT EG7
Acid-base deficit: 2 mmol/L (ref 0.0–2.0)
Bicarbonate: 24.3 mmol/L (ref 20.0–28.0)
Calcium, Ion: 1.29 mmol/L (ref 1.15–1.40)
HCT: 31 % — ABNORMAL LOW (ref 39.0–52.0)
Hemoglobin: 10.5 g/dL — ABNORMAL LOW (ref 13.0–17.0)
O2 Saturation: 62 %
Potassium: 3.9 mmol/L (ref 3.5–5.1)
Sodium: 141 mmol/L (ref 135–145)
TCO2: 26 mmol/L (ref 22–32)
pCO2, Ven: 45.6 mmHg (ref 44–60)
pH, Ven: 7.334 (ref 7.25–7.43)
pO2, Ven: 35 mmHg (ref 32–45)

## 2022-03-23 LAB — BASIC METABOLIC PANEL
Anion gap: 6 (ref 5–15)
BUN: 17 mg/dL (ref 8–23)
CO2: 25 mmol/L (ref 22–32)
Calcium: 9.1 mg/dL (ref 8.9–10.3)
Chloride: 110 mmol/L (ref 98–111)
Creatinine, Ser: 1.42 mg/dL — ABNORMAL HIGH (ref 0.61–1.24)
GFR, Estimated: 53 mL/min — ABNORMAL LOW (ref >60)
Glucose, Bld: 86 mg/dL (ref 70–99)
Potassium: 3.9 mmol/L (ref 3.5–5.1)
Sodium: 141 mmol/L (ref 135–145)

## 2022-03-23 SURGERY — RIGHT/LEFT HEART CATH AND CORONARY ANGIOGRAPHY
Anesthesia: LOCAL

## 2022-03-23 MED ORDER — LIDOCAINE HCL (PF) 1 % IJ SOLN
INTRAMUSCULAR | Status: DC | PRN
  Administered 2022-03-23 (×2): 2 mL

## 2022-03-23 MED ORDER — SODIUM CHLORIDE 0.9 % IV SOLN: INTRAVENOUS | Status: DC

## 2022-03-23 MED ORDER — SODIUM CHLORIDE 0.9% FLUSH: 3.0000 mL | Freq: Two times a day (BID) | INTRAVENOUS | Status: DC

## 2022-03-23 MED ORDER — MIDAZOLAM HCL 2 MG/2ML IJ SOLN
INTRAMUSCULAR | Status: DC | PRN
  Administered 2022-03-23: 2 mg via INTRAVENOUS

## 2022-03-23 MED ORDER — FENTANYL CITRATE (PF) 100 MCG/2ML IJ SOLN
INTRAMUSCULAR | Status: DC | PRN
  Administered 2022-03-23: 25 ug via INTRAVENOUS

## 2022-03-23 MED ORDER — ASPIRIN 81 MG PO CHEW: 81.0000 mg | CHEWABLE_TABLET | ORAL | Status: DC

## 2022-03-23 MED ORDER — MIDAZOLAM HCL 2 MG/2ML IJ SOLN
INTRAMUSCULAR | Status: AC
  Filled 2022-03-23: qty 2

## 2022-03-23 MED ORDER — SODIUM CHLORIDE 0.9 % IV SOLN: 250.0000 mL | INTRAVENOUS | Status: DC | PRN

## 2022-03-23 MED ORDER — HEPARIN SOD (PORK) LOCK FLUSH 100 UNIT/ML IV SOLN
500.0000 [IU] | INTRAVENOUS | Status: AC | PRN
  Administered 2022-03-23: 500 [IU]

## 2022-03-23 MED ORDER — SODIUM CHLORIDE 0.9% FLUSH: 3.0000 mL | INTRAVENOUS | Status: DC | PRN

## 2022-03-23 MED ORDER — IOHEXOL 350 MG/ML SOLN
INTRAVENOUS | Status: DC | PRN
  Administered 2022-03-23: 30 mL

## 2022-03-23 MED ORDER — FENTANYL CITRATE (PF) 100 MCG/2ML IJ SOLN
INTRAMUSCULAR | Status: AC
  Filled 2022-03-23: qty 2

## 2022-03-23 MED ORDER — HEPARIN (PORCINE) IN NACL 1000-0.9 UT/500ML-% IV SOLN
INTRAVENOUS | Status: AC
  Filled 2022-03-23: qty 1000

## 2022-03-23 MED ORDER — ACETAMINOPHEN 325 MG PO TABS: 650.0000 mg | ORAL_TABLET | ORAL | Status: DC | PRN

## 2022-03-23 MED ORDER — VERAPAMIL HCL 2.5 MG/ML IV SOLN
INTRAVENOUS | Status: AC
  Filled 2022-03-23: qty 2

## 2022-03-23 MED ORDER — VERAPAMIL HCL 2.5 MG/ML IV SOLN
INTRAVENOUS | Status: DC | PRN
  Administered 2022-03-23: 10 mL via INTRA_ARTERIAL

## 2022-03-23 MED ORDER — LIDOCAINE HCL (PF) 1 % IJ SOLN
INTRAMUSCULAR | Status: AC
  Filled 2022-03-23: qty 30

## 2022-03-23 MED ORDER — ONDANSETRON HCL 4 MG/2ML IJ SOLN: 4.0000 mg | Freq: Four times a day (QID) | INTRAMUSCULAR | Status: DC | PRN

## 2022-03-23 MED ORDER — HEPARIN SODIUM (PORCINE) 1000 UNIT/ML IJ SOLN
INTRAMUSCULAR | Status: DC | PRN
  Administered 2022-03-23: 5000 [IU] via INTRAVENOUS

## 2022-03-23 SURGICAL SUPPLY — 14 items
BAND CMPR LRG ZPHR (HEMOSTASIS) ×1
BAND ZEPHYR COMPRESS 30 LONG (HEMOSTASIS) IMPLANT
CATH BALLN WEDGE 5F 110CM (CATHETERS) IMPLANT
CATH OPTITORQUE TIG 4.0 5F (CATHETERS) IMPLANT
GLIDESHEATH SLEND A-KIT 6F 22G (SHEATH) IMPLANT
GUIDEWIRE INQWIRE 1.5J.035X260 (WIRE) IMPLANT
INQWIRE 1.5J .035X260CM (WIRE) ×1
KIT HEART LEFT (KITS) ×1 IMPLANT
PACK CARDIAC CATHETERIZATION (CUSTOM PROCEDURE TRAY) ×1 IMPLANT
SHEATH GLIDE SLENDER 4/5FR (SHEATH) IMPLANT
SYR CONTROL 10ML ANGIOGRAPHIC (SYRINGE) IMPLANT
TRANSDUCER W/STOPCOCK (MISCELLANEOUS) ×1 IMPLANT
TUBING CIL FLEX 10 FLL-RA (TUBING) ×1 IMPLANT
VALVE MANIFOLD 3 PORT W/RA/ON (MISCELLANEOUS) IMPLANT

## 2022-03-24 ENCOUNTER — Encounter (HOSPITAL_COMMUNITY): Payer: Self-pay | Admitting: Cardiology

## 2022-03-24 MED FILL — Heparin Sod (Porcine)-NaCl IV Soln 1000 Unit/500ML-0.9%: INTRAVENOUS | Qty: 1000 | Status: AC

## 2022-03-29 NOTE — H&P (Signed)
Follow up visit  Subjective:   Gilbert [name], male    DOB: 1951-01-17, 71 y.o.   MRN: [id number]   Chief Complaint  Patient presents with   Chronic diastolic heart failure    Follow-up    6 months    71 y.o. Caucasian male  y/o Caucasian male with CAD s/p MI, LM stenting 2015, ischemic cardiomyopathy with recovery in EF to 55% on guideline directed medical therapy, PAD s/p Left external iliac/common femoral stenting 2015, h/o left nephrectomy for renal cell carcinoma, h/o NHL, currently in remission, stage IIIa chronic kidney disease, primary hypertension, hyperlipidemia presents for follow-up of heart failure and coronary artery disease.   He does not want to quit chewing tobacco.  Patient was last seen in our office 12/25/2021 with acute heart failure has resolved, therefore advised patient to switch Lasix and potassium to as needed and ordered repeat echocardiogram  Echocardiogram revealed new regional wall motion abnormalities with LVEF reduced from 50-55% to 40-45%.  Patient is presently taking 1/2 tablet of furosemide daily as he feels his legs start to swell and he gets markedly dyspneic if he does not.  He has not had any chest pain.  Past Medical History:  Diagnosis Date   Abnormal liver enzymes    Chronic alkaline phosphatase elevation since 2014   Acid reflux    CAD (coronary artery disease)    Cancer (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 Used Impella Circulatory  assist device. EF= 20-25%   History of epilepsy    at least 10 years ago. 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 EF 45% 04/10/2014   Therapeutic drug monitoring    Social History   Tobacco Use   Smoking status: Former    Packs/day: 2.00    Years: 45.00    Total pack years: 90.00    Types: Cigarettes    Quit date: 08/03/2003    Years since quitting: 18.6   Smokeless tobacco: Current    Types: Snuff   Tobacco comments:    quit  smoking 10 years agop  Substance Use Topics   Alcohol use: No    Alcohol/week: 0.0 standard drinks of alcohol    Comment: quit 1980  Marital Status: Divorced  Allergies   Allergies  Allergen Reactions   Oxycodone Swelling and Other (See Comments)    Tongue and lips swell  Final Medications at End of Visit    Current Outpatient Medications:    acetaminophen (TYLENOL) 500 MG tablet, Take 500 mg by mouth every 6 (six) hours as needed., Disp: , Rfl:    atorvastatin (LIPITOR) 80 MG tablet, Take 1 tablet by mouth once daily, Disp: 90 tablet, Rfl: 0   carvedilol (COREG) 12.5 MG tablet, TAKE 1 TABLET BY MOUTH TWICE DAILY WITH A MEAL, Disp: 180 tablet, Rfl: 3   clopidogrel (PLAVIX) 75 MG tablet, Take 1 tablet (75 mg total) by mouth daily., Disp: 90 tablet, Rfl: 3   Cyanocobalamin (B-12) 5000 MCG CAPS, Take 5,000 mcg by mouth every morning. , Disp: , Rfl:    ezetimibe (ZETIA) 10 MG tablet, Take 1 tablet by mouth once daily, Disp: 90 tablet, Rfl: 0   famotidine (PEPCID) 20 MG tablet, Take 20 mg by mouth 2 (two)  times daily., Disp: , Rfl:    furosemide (LASIX) 40 MG tablet, Take 1 tablet (40 mg total) by mouth daily as needed. For fluid build up or weight gain of 2 Lbs, Disp: 90 tablet, Rfl: 1   hydrOXYzine (ATARAX) 10 MG tablet, Take 1 tablet (10 mg total) by mouth 3 (three) times daily as needed for itching., Disp: 30 tablet, Rfl: 2   isosorbide mononitrate (IMDUR) 60 MG 24 hr tablet, Take 1 tablet (60 mg total) by mouth daily., Disp: 90 tablet, Rfl: 3   phenytoin (DILANTIN) 300 MG ER capsule, Take 1 capsule (300 mg total) by mouth daily., Disp: 90 capsule, Rfl: 1   potassium chloride (KLOR-CON M) 10 MEQ tablet, Take 1 tablet (10 mEq total) by mouth daily as needed (With Furosemide). Take with Furosemide (Fluid Pill), Disp: 90 tablet, Rfl: 3   pyridOXINE (VITAMIN B-6) 100 MG tablet, Take 100 mg by mouth every morning. , Disp: , Rfl:    sacubitril-valsartan (ENTRESTO) 49-51 MG, Take 1 tablet by mouth 2 (two) times daily., Disp: 180 tablet, Rfl: 3   Thiamine HCl (VITAMIN B-1) 250 MG tablet, Take 250 mg by mouth every morning. , Disp: , Rfl:    Laboratory Testing:   Lab Results  Component Value Date   NA 141 12/30/2021   K 4.0 12/30/2021   CO2 27 12/30/2021   GLUCOSE 81 12/30/2021   BUN 21 12/30/2021   CREATININE 1.68 (H) 12/30/2021   CALCIUM 9.4 12/30/2021   EGFR 56 (L) 12/10/2021   GFRNONAA 59 (L) 07/11/2019       Latest Ref Rng & Units 12/30/2021    3:32 PM 12/24/2021    1:46 PM 12/21/2021    3:12 PM  CMP  Glucose 70 - 99 mg/dL 81  92  98   BUN 6 - 23 mg/dL _0 Creatinine 0.40 - 1.50 mg/dL 1.68  1.78  1.67   Sodium 135 - 145 mEq/L 141  140  139   Potassium 3.5 - 5.1 mEq/L 4.0  3.6  3.9   Chloride 96 - 112 mEq/L 101  100  99   CO2 19 - 32 mEq/L _1 Calcium 8.4 - 10.5 mg/dL 9.4  9.4  9.7   Total Protein 6.0 - 8.3 g/dL   6.5   Total Bilirubin 0.2 - 1.2 mg/dL   0.6   Alkaline Phos 39 - 117 U/L   143   AST 0 - 37 U/L   24   ALT 0 - 53 U/L   12  Latest Ref Rng &  Units 12/30/2021    3:32 PM 12/24/2021    1:46 PM 12/21/2021    3:12 PM  CBC  WBC 4.0 - 10.5 K/uL 8.8  11.3  11.8   Hemoglobin 13.0 - 17.0 g/dL 12.5  12.3  12.8   Hematocrit 39.0 - 52.0 % 36.9  36.2  36.7   Platelets 150.0 - 400.0 K/uL 266.0  258.0  221.0    Lab Results  Component Value Date   CHOL 187 12/21/2021   HDL 65.20 12/21/2021   LDLCALC 94 12/21/2021   TRIG 135.0 12/21/2021   CHOLHDL 3 12/21/2021    HEMOGLOBIN A1C Lab Results  Component Value Date   HGBA1C 5.1 08/13/2014   MPG 100 08/13/2014   TSH No results for input(s): "TSH" in the last 8760 hours.   ROS   Review of Systems  Cardiovascular:  Positive for dyspnea on exertion. Negative for chest pain, claudication, leg swelling and orthopnea.   Objective:      03/10/2022    1:58 PM 01/18/2022    1:05 PM 12/30/2021    2:18 PM  Vitals with BMI  Height 5' 10" 5' 10" 5' 10"  Weight 176 lbs 10 oz 173 lbs 10 oz 164 lbs 13 oz  BMI 25.34 53.29 92.42  Systolic 683 419 622  Diastolic 84 66 78  Pulse 70 49 80    Today's Vitals   03/10/22 1358  BP: (!) 174/84  Pulse: 70  Resp: 16  Temp: (!) 97 F (36.1 C)  TempSrc: Temporal  SpO2: 99%  Weight: 176 lb 9.6 oz (80.1 kg)  Height: 5' 10" (1.778 m)   Body mass index is 25.34 kg/m.    Physical Exam Vitals reviewed.  Neck:     Vascular: No JVD.  Cardiovascular:     Rate and Rhythm: Normal rate and regular rhythm.     Pulses:          Dorsalis pedis pulses are 0 on the right side and 0 on the left side.       Posterior tibial pulses are 1+ on the right side and 1+ on the left side.     Heart sounds: Normal heart sounds. No murmur heard. Pulmonary:     Effort: Pulmonary effort is normal.     Breath sounds: Normal breath sounds.  Musculoskeletal:     Right lower leg: No edema.     Left lower leg: No edema.    Cardiovascular studies:   Coronary angiogram 06/10/2015: LM: DES stent placed on 11/27/2013 widely patent.   LAD: Ostial and proximal LAD stent  widely patent again placed on 11/27/2013. Mid to distal segment shows very mild luminal irregularity. Gives origin to several small diagonals. There is collateralization from the left to the distal right coronary artery. Also gives collaterals to the occluded mid to distal circumflex coronary artery.   Ramus intermediate: Large vessel. Midsegment of the ramus intermediate shows mild 20-30% diffuse % luminal irregularity, proximal segment shows a 40% stenosis.   Circumflex coronary artery: A small vessel. Severely diffusely diseased. Ostium has a 70-80% stenosis in some new appears to be 90% unchanged from prior angiography. Occluded in the midsegment. Collaterals of obtuse marginal is evident with excellent filling of the distal circumflex.   Right coronary artery co-dominant vessel, it is occluded after the origin of the RV branch. Type II collaterals evident from the left to the right coronary artery. The RV branch has diffuse disease, mid segment  has 90% stenosis, again unchanged from prior angiography in April 2015.    Impression: Abnormal stress test due to occluded distal circumflex and right coronary artery, this is chronic total occlusion.  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 Right common carotid artery and right external carotid artery stenosis of (<50%). Stenosis in the left internal carotid artery (16-49%).  Antegrade right vertebral artery flow. Antegrade left vertebral artery flow. Compared to the study done on 01/27/2018,  right ICA stenosis is new.  Left ICA stenosis was >50%. F/U in 6 months.  PCV ECHOCARDIOGRAM COMPLETE 01/01/2022 Mildly depressed LV systolic function with visual EF 40-45%. Left ventricle cavity is mildly dilated. Mild left ventricular hypertrophy. Doppler evidence of grade I (impaired) diastolic dysfunction, normal LAP. Left ventricle regional wall motion findings: Basal inferolateral, Basal inferior, Basal inferoseptal, Mid  inferolateral, Mid inferior, Mid inferoseptal, Apical inferior, Apical septal and Apical cap hypokinesis. Mild (Grade I) mitral regurgitation. Compared to 03/11/2021 LVEF was 50-55% and now 40-45%, RWMA appear to be new, otherwise no significant change.   PCV MYOCARDIAL PERFUSION WITH LEXISCAN 01/27/2022  Narrative Lexiscan Tetrofosmin stress test 01/27/2022: 1 Day Rest/Stress protocol. Stress EKG is non-diagnostic due to baseline ST-T changes and target heart rate not achieved. Medium size, moderate to severe intensity, fixed perfusion defect involving the RCA/LCx distribution likely suggestive of infarct. Small size, mild intensity, reversible perfusion defect involving the apical septal segment. Left ventricular size dilated, reduced wall thickness, severe global hypokinesis with regional wall motion abnormalities associated with the perfusion defect, calculated LVEF 24%. Prior study 06/09/2017 noted large size scar along the inferolateral segment and mild area of ischemia in septal wall. LVEF was 17%. High risk study.   EKG   EKG 03/10/2022: Possibly ectopic atrial rhythm with short PR interval, cannot exclude inferior infarct old.  Anteroseptal infarct old.  Nonspecific T abnormality.  Compared to 01/18/2022, lateral ST depression with T wave inversion not present.  Assessment and Recommendations:      ICD-10-CM   1. Coronary artery disease of native artery of native heart with stable angina pectoris (HCC)  I25.118 EKG 12-Lead    CBC    isosorbide mononitrate (IMDUR) 60 MG 24 hr tablet    2. Acute on chronic diastolic heart failure (HCC)  I50.33 Pro b natriuretic peptide (BNP)    3. Essential hypertension  I10     4. Bilateral carotid artery stenosis  I65.23     5. Pure hypercholesterolemia  E78.00 Lipid Panel With LDL/HDL Ratio    LDL cholesterol, direct     Scotland Heckendorn is a 71 y.o. y/o Caucasian male with CAD s/p MI, LM stenting 2015, ischemic cardiomyopathy with recovery in EF  to 55% on guideline directed medical therapy, PAD s/p Left external iliac/common femoral stenting 2015, h/o left nephrectomy for renal cell carcinoma, h/o NHL, currently in remission, stage IIIa chronic kidney disease, primary hypertension, hyperlipidemia presents for follow-up of heart failure and coronary artery disease.   He does not want to quit chewing tobacco.  Patient was last seen in our office 12/25/2021 with acute heart failure has resolved, therefore advised patient to switch Lasix and potassium to as needed and ordered repeat echocardiogram  Echocardiogram revealed new regional wall motion abnormalities with LVEF reduced from 50-55% to 40-45%.  Patient is presently taking 1/2 tablet of furosemide daily as he feels his legs start to swell and he gets markedly dyspneic if he does not.  He has not had any chest pain.  I reviewed the results of the nuclear stress test.  Extremely difficult representation in view of wall motion abnormality and presence of ischemia and reduced LVEF.  He has left main stent as well.  He Luce have restenosis in the same.  In view of this I have recommended to proceed with cardiac catheterization.  His lipid profile testing and also proBNP elevated his baseline as he is presently does not appear to be floridly in volume overload state although he still is not mild heart failure as evidenced by symptoms.  His LDL is not at goal and we Shoemaker have to consider PCSK9 inhibitors as he is presently on maximum dose of atorvastatin along with Zetia.  Blood pressure is elevated, however I would like to wait before making any changes to his medications to proceed with cardiac catheterization.  Suspect elevated blood pressure is probably an error as blood pressure previously has been very well-controlled.  Schedule for cardiac catheterization, and possible angioplasty. We discussed regarding risks, benefits, alternatives to this including stress testing, CTA and continued medical  therapy. Patient wants to proceed. Understands <1-2% risk of death, stroke, MI, urgent CABG, bleeding, infection, renal failure but not limited to these.  Is a 38-minute office visit encounter with discussions regarding cardiac catheterization, review of echocardiogram and nuclear stress test.  Patient has not been able to get phenytoin as it is in short supply. He took his last dose today. This I think was started after his cardiac arrest in 2015.    Adrian Prows, MD, [facility] 03/10/2022, 9:54 PM Office: [phone] Fax: [phone] Pager: [phone]

## 2022-04-08 ENCOUNTER — Ambulatory Visit: Payer: Medicare Other | Admitting: Cardiology

## 2022-04-08 ENCOUNTER — Encounter: Payer: Self-pay | Admitting: Cardiology

## 2022-04-08 VITALS — BP 104/60 | HR 56 | Temp 97.3°F | Resp 16 | Ht 70.0 in | Wt 169.0 lb

## 2022-04-08 DIAGNOSIS — I25118 Atherosclerotic heart disease of native coronary artery with other forms of angina pectoris: Secondary | ICD-10-CM | POA: Diagnosis not present

## 2022-04-08 DIAGNOSIS — I255 Ischemic cardiomyopathy: Secondary | ICD-10-CM | POA: Diagnosis not present

## 2022-04-08 DIAGNOSIS — I5022 Chronic systolic (congestive) heart failure: Secondary | ICD-10-CM | POA: Diagnosis not present

## 2022-04-08 DIAGNOSIS — I10 Essential (primary) hypertension: Secondary | ICD-10-CM

## 2022-04-08 NOTE — Patient Instructions (Signed)
Please continue clopidogrel as you cannot tolerate aspirin, this is to keep your old stents open and to improve the flow in your heart arteries.

## 2022-04-08 NOTE — Progress Notes (Signed)
Follow up visit  Subjective:   Gilbert [name], male    DOB: April 03, 1951, 71 y.o.   MRN: [id number]   Chief Complaint  Patient presents with   Coronary Artery Disease   Follow-up    71 y.o. Caucasian male Caucasian male with CAD s/p MI, LM stenting 2015, ischemic cardiomyopathy with recovery in EF to 55% on guideline directed medical therapy, PAD s/p Left external iliac/common femoral stenting 2015, h/o left nephrectomy for renal cell carcinoma, h/o NHL, currently in remission, stage IIIa chronic kidney disease, primary hypertension, hyperlipidemia presents for follow-up of heart failure and coronary artery disease.   He does not want to quit chewing tobacco.  His last acute decompensated systolic heart failure was on 12/25/2021.  Underwent right and left heart catheterization on 03/23/2022 and again revealing chronically occluded RCA and circumflex in the distribution of the abnormal nuclear stress test, this was also seen previously, overall no target for revascularization, recommended medical therapy. Dyspnea has remained stable, no further leg edema.  No complications from the procedure. He has not had any chest pain.  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 Used Impella Circulatory assist device. EF= 20-25%    History of epilepsy    at least 10 years ago. 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 EF 45% 04/10/2014   Therapeutic drug monitoring    Social History   Tobacco Use   Smoking status: Former    Packs/day: 2.00    Years: 45.00    Total pack years: 90.00    Types: Cigarettes    Quit date: 08/03/2003    Years since quitting: 18.6   Smokeless tobacco: Current    Types: Snuff   Tobacco comments:    quit  smoking 10 years agop  Substance Use Topics   Alcohol use: No    Alcohol/week: 0.0 standard drinks of alcohol    Comment: quit 1980  Marital Status: Divorced  Allergies   Allergies  Allergen Reactions   Oxycodone Swelling and Other (See Comments)    Tongue and lips swell    Final Medications at End of Visit    Current Outpatient Medications:  acetaminophen (TYLENOL) 500 MG tablet, Take 1,000 mg by mouth daily., Disp: , Rfl:    APPLE CIDER VINEGAR PO, Take 480 mg by mouth daily., Disp: , Rfl:    atorvastatin (LIPITOR) 80 MG tablet, Take 1 tablet by mouth once daily, Disp: 90 tablet, Rfl: 0   carvedilol (COREG) 12.5 MG tablet, TAKE 1 TABLET BY MOUTH TWICE DAILY WITH A MEAL, Disp: 180 tablet, Rfl: 3   clopidogrel (PLAVIX) 75 MG tablet, Take 1 tablet (75 mg total) by mouth daily., Disp: 90 tablet, Rfl: 3   Cyanocobalamin (B-12) 2500 MCG TABS, Take 2,500 mcg by mouth every morning., Disp: , Rfl:    ezetimibe (ZETIA) 10 MG tablet, Take 1 tablet by mouth once daily, Disp: 90 tablet, Rfl: 0   famotidine (PEPCID) 20 MG tablet, Take 20  mg by mouth 2 (two) times daily., Disp: , Rfl:    furosemide (LASIX) 40 MG tablet, Take 1 tablet (40 mg total) by mouth daily as needed. For fluid build up or weight gain of 2 Lbs (Patient taking differently: Take 40 mg by mouth daily as needed for fluid (Fluid build up or weight gain of 2 lbs).), Disp: 90 tablet, Rfl: 1   hydrOXYzine (ATARAX) 10 MG tablet, Take 1 tablet (10 mg total) by mouth 3 (three) times daily as needed for itching. (Patient taking differently: Take 10 mg by mouth 2 (two) times daily.), Disp: 30 tablet, Rfl: 2   isosorbide mononitrate (IMDUR) 60 MG 24 hr tablet, Take 1 tablet (60 mg total) by mouth daily., Disp: 90 tablet, Rfl: 3   phenytoin (DILANTIN) 100 MG ER capsule, Take 3 capsules (300 mg total) by mouth daily., Disp: 90 capsule, Rfl: 1   potassium chloride (KLOR-CON M) 10 MEQ tablet, Take 1 tablet (10 mEq total) by mouth daily as needed (With Furosemide). Take with Furosemide (Fluid Pill), Disp: 90 tablet, Rfl: 3   pyridOXINE (VITAMIN B-6) 100 MG tablet, Take 100 mg by mouth every morning. , Disp: , Rfl:    sacubitril-valsartan (ENTRESTO) 49-51 MG, Take 1 tablet by mouth 2 (two) times daily., Disp: 180 tablet, Rfl: 3   Thiamine HCl (VITAMIN B-1) 250 MG tablet, Take 250 mg by mouth every morning. , Disp: , Rfl:    Laboratory Testing:   Lab Results  Component Value Date   NA 141 03/23/2022   K 3.9 03/23/2022   CO2 25 03/23/2022   GLUCOSE 86 03/23/2022   BUN 17 03/23/2022   CREATININE 1.42 (H) 03/23/2022   CALCIUM 9.1 03/23/2022   EGFR 56 (L) 12/10/2021   GFRNONAA 53 (L) 03/23/2022       Latest Ref Rng & Units 03/23/2022    5:19 PM 03/23/2022    5:17 PM 03/23/2022    1:11 PM  CMP  Glucose 70 - 99 mg/dL   86   BUN 8 - 23 mg/dL   17   Creatinine 0.61 - 1.24 mg/dL   1.42   Sodium 135 - 145 mmol/L 141  140  141   Potassium 3.5 - 5.1 mmol/L 3.9  4.0  3.9   Chloride 98 - 111 mmol/L   110   CO2 22 - 32 mmol/L   25   Calcium 8.9 - 10.3 mg/dL   9.1       Latest  Ref Rng & Units 03/23/2022    5:19 PM 03/23/2022    5:17 PM 03/10/2022    3:17 PM  CBC  WBC 3.4 - 10.8 x10E3/uL   7.3  Hemoglobin 13.0 - 17.0 g/dL 10.5  10.9  13.7   Hematocrit 39.0 - 52.0 % 31.0  32.0  38.7   Platelets 150 - 450 x10E3/uL   193    Lab Results  Component Value Date   CHOL 193 03/10/2022   HDL 67 03/10/2022   LDLCALC 103 (H) 03/10/2022   LDLDIRECT 99 03/10/2022   TRIG 132 03/10/2022   CHOLHDL 3 12/21/2021    HEMOGLOBIN A1C Lab Results  Component Value Date   HGBA1C 5.1 08/13/2014   MPG 100 08/13/2014   TSH No results for input(s): "TSH" in the last 8760 hours.   ROS   Review of Systems  Cardiovascular:  Positive for dyspnea on exertion. Negative for chest pain, claudication, leg swelling and orthopnea.   Objective:      04/08/2022    1:52 PM 03/23/2022    7:32 PM 03/23/2022    7:03 PM  Vitals with BMI  Height _0     Weight 169 lbs    BMI 16.10    Systolic 960 454 098  Diastolic 60 66 66  Pulse 56      Today's Vitals   04/08/22 1352  BP: 104/60  Pulse: (!) 56  Resp: 16  Temp: (!) 97.3 F (36.3 C)  TempSrc: Temporal  SpO2: 95%  Weight: 169 lb (76.7 kg)  Height: _1  (1.778 m)   Body mass index is 24.25 kg/m.    Physical Exam Vitals reviewed.  Neck:     Vascular: No JVD.  Cardiovascular:     Rate and Rhythm: Normal rate and regular rhythm.     Pulses:          Dorsalis pedis pulses are 0 on the right side and 0 on the left side.       Posterior tibial pulses are 1+ on the right side and 1+ on the left side.     Heart sounds: Normal heart sounds. No murmur heard. Pulmonary:     Effort: Pulmonary effort is normal.     Breath sounds: Normal breath sounds.  Musculoskeletal:     Right lower leg: No edema.     Left lower leg: No edema.    Cardiovascular studies:    Carotid artery duplex 09/15/2021: Duplex suggests stenosis in the right internal carotid artery (1-15%). Duplex suggests stenosis in the left internal carotid artery  (16-49%). Duplex suggests stenosis in the left external carotid artery (<50%). Antegrade right vertebral artery flow. Antegrade left vertebral artery flow. No significant change from 02/08/2019. Follow up in one year is appropriate if clinically indicated.  PCV ECHOCARDIOGRAM COMPLETE 01/01/2022 Mildly depressed LV systolic function with visual EF 40-45%. Left ventricle cavity is mildly dilated. Mild left ventricular hypertrophy. Doppler evidence of grade I (impaired) diastolic dysfunction, normal LAP. Left ventricle regional wall motion findings: Basal inferolateral, Basal inferior, Basal inferoseptal, Mid inferolateral, Mid inferior, Mid inferoseptal, Apical inferior, Apical septal and Apical cap hypokinesis. Mild (Grade I) mitral regurgitation. Compared to 03/11/2021 LVEF was 50-55% and now 40-45%, RWMA appear to be new, otherwise no significant change.   PCV MYOCARDIAL PERFUSION WITH LEXISCAN 01/27/2022  Narrative Lexiscan Tetrofosmin stress test 01/27/2022: 1 Day Rest/Stress protocol. Stress EKG is non-diagnostic due to baseline ST-T changes and target heart rate not achieved. Medium size, moderate to severe intensity, fixed perfusion defect involving the RCA/LCx distribution likely suggestive of infarct. Small size, mild intensity, reversible perfusion defect involving the apical septal segment. Left ventricular size dilated, reduced wall thickness, severe global hypokinesis  with regional wall motion abnormalities associated with the perfusion defect, calculated LVEF 24%. Prior study 06/09/2017 noted large size scar along the inferolateral segment and mild area of ischemia in septal wall. LVEF was 17%. High risk study.  Right & Left Heart Catheterization 03/23/22:  RA 5/4, mean 4 mmHg RV 27/5, EDP 8 mmHg PA 31/17, mean 21 mmHg. PW 10/10, mean 9 mmHg. CO 3.8, CI 1.98, mildly reduced by Fick.  QP/QS 1.00.  PVR 3.16 Wood units.   LV: 171/5, EDP 13 mmHg.  Ao 165/100, mean 111 mmHg. LM:  Widely patent left main and proximal LAD stent.  Mild to moderate diffuse disease in the LAD but no high-grade stenosis.  D1 is moderate to large sized with diffuse disease, mid 50% stenosis unchanged from prior cardiac catheterization. LCx: Proximal 90% stenosis, gives origin to small OM1 which is severely diseased.  Mid segment of the circumflex is occluded and there are 2 marginals and AV groove circumflex that is seen filled by collaterals, this is again unchanged. RI: Moderate sized vessel, mild disease. RCA: The right coronary artery has high origin of RV branch, following which is completely occluded.  There is faint filling of the distal RCA with TIMI 0-1 flow.  Distal RCA and PDA and PL branches are supplied by type III collaterals from the left.  Again this anatomy is unchanged in the distal segment however occlusion of the proximal RCA is new.  Inconsequential clinically.     Impression: Findings are consistent with ischemic cardiomyopathy.  No evidence of pulm hypertension.  Cardiac output and cardiac index are borderline reduced.   EKG   EKG 03/10/2022: Possibly ectopic atrial rhythm with short PR interval, cannot exclude inferior infarct old.  Anteroseptal infarct old.  Nonspecific T abnormality.  Compared to 01/18/2022, lateral ST depression with T wave inversion not present.  Assessment and Recommendations:      ICD-10-CM   1. Coronary artery disease of native artery of native heart with stable angina pectoris ([place])  I25.118     2. Ischemic cardiomyopathy  I25.5     3. Chronic systolic heart failure (HCC)  I50.22     4. Primary hypertension  I10      No orders of the defined types were placed in this encounter.   There are no discontinued medications.   Gilbert [name] is a 70 y.o.Caucasian male with CAD s/p MI, LM stenting 2015, ischemic cardiomyopathy with recovery in EF to 55% on guideline directed medical therapy, PAD s/p Left external iliac/common femoral stenting 2015, h/o  left nephrectomy for renal cell carcinoma, h/o NHL, currently in remission, stage IIIa chronic kidney disease, primary hypertension, hyperlipidemia presents for follow-up of heart failure and coronary artery disease.   He does not want to quit chewing tobacco.  His last acute decompensated systolic heart failure was on 12/25/2021.  Underwent right and left heart catheterization on 03/23/2022 and again revealing chronically occluded RCA and circumflex in the distribution of the abnormal nuclear stress test, this was also seen previously, overall no target for revascularization, recommended medical therapy.  Blood pressure is well controlled.  He was started on Zetia for hyperlipidemia.  He will eventually need lipid profile testing at some point, we will check this at 6 months.  No clinical evidence of heart failure.  Again advised him and discussed with him regarding abstaining from tobacco and also discussed with him regarding importance of following low-salt diet and to continue to use Lasix on a as needed basis.  He does not want to participate in any clinical trials. Do not think he is a candidate for ICD in view of EF being 40%.  I will see him back in 6 months for follow-up.    Adrian Prows, MD, [facility] 04/08/2022, 10:40 PM Office: [phone] Fax: [phone] Pager: [phone]

## 2022-04-18 ENCOUNTER — Other Ambulatory Visit: Payer: Self-pay | Admitting: Cardiology

## 2022-04-18 DIAGNOSIS — L509 Urticaria, unspecified: Secondary | ICD-10-CM

## 2022-05-03 ENCOUNTER — Other Ambulatory Visit: Payer: Self-pay | Admitting: Cardiology

## 2022-05-03 ENCOUNTER — Other Ambulatory Visit: Payer: Self-pay | Admitting: Family Medicine

## 2022-05-03 DIAGNOSIS — L509 Urticaria, unspecified: Secondary | ICD-10-CM

## 2022-05-04 NOTE — Telephone Encounter (Signed)
Refill request

## 2022-05-26 ENCOUNTER — Other Ambulatory Visit: Payer: Self-pay | Admitting: Family Medicine

## 2022-06-21 ENCOUNTER — Encounter: Payer: Medicare Other | Admitting: Family Medicine

## 2022-07-05 ENCOUNTER — Other Ambulatory Visit: Payer: Self-pay | Admitting: Family Medicine

## 2022-07-05 ENCOUNTER — Other Ambulatory Visit: Payer: Self-pay | Admitting: Lab

## 2022-07-05 NOTE — Telephone Encounter (Signed)
Patient is requesting a refill of the following medications: Requested Prescriptions   Pending Prescriptions Disp Refills   phenytoin (DILANTIN) 100 MG ER capsule [Pharmacy Med Name: Phenytoin Sodium Extended 100 MG Oral Capsule] 90 capsule 0    Sig: TAKE 3 CAPSULES BY MOUTH ONCE DAILY    Date of patient request: 07/05/22 Last office visit: 12/30/21 Date of last refill: 05/26/22 Last refill amount: 90

## 2022-07-06 NOTE — Telephone Encounter (Signed)
Refilled

## 2022-07-16 ENCOUNTER — Other Ambulatory Visit: Payer: Self-pay | Admitting: Cardiology

## 2022-07-16 DIAGNOSIS — L509 Urticaria, unspecified: Secondary | ICD-10-CM

## 2022-07-16 MED ORDER — HYDROXYZINE HCL 10 MG PO TABS: ORAL_TABLET | ORAL | 1 refills | Status: DC

## 2022-08-01 ENCOUNTER — Other Ambulatory Visit: Payer: Self-pay | Admitting: Family Medicine

## 2022-08-03 ENCOUNTER — Other Ambulatory Visit: Payer: Self-pay | Admitting: Lab

## 2022-08-03 DIAGNOSIS — R569 Unspecified convulsions: Secondary | ICD-10-CM

## 2022-08-03 MED ORDER — PHENYTOIN SODIUM EXTENDED 100 MG PO CAPS: 300.0000 mg | ORAL_CAPSULE | Freq: Every day | ORAL | 0 refills | Status: DC

## 2022-08-22 ENCOUNTER — Other Ambulatory Visit: Payer: Self-pay | Admitting: Cardiology

## 2022-08-22 DIAGNOSIS — I739 Peripheral vascular disease, unspecified: Secondary | ICD-10-CM

## 2022-08-22 DIAGNOSIS — I2510 Atherosclerotic heart disease of native coronary artery without angina pectoris: Secondary | ICD-10-CM

## 2022-08-29 ENCOUNTER — Other Ambulatory Visit: Payer: Self-pay | Admitting: Cardiology

## 2022-09-02 NOTE — Progress Notes (Signed)
This encounter was created in error - please disregard.

## 2022-09-06 DIAGNOSIS — N183 Chronic kidney disease, stage 3 unspecified: Secondary | ICD-10-CM | POA: Diagnosis not present

## 2022-09-06 DIAGNOSIS — R809 Proteinuria, unspecified: Secondary | ICD-10-CM | POA: Diagnosis not present

## 2022-09-06 DIAGNOSIS — I129 Hypertensive chronic kidney disease with stage 1 through stage 4 chronic kidney disease, or unspecified chronic kidney disease: Secondary | ICD-10-CM | POA: Diagnosis not present

## 2022-09-15 ENCOUNTER — Other Ambulatory Visit: Payer: Self-pay | Admitting: Cardiology

## 2022-09-15 DIAGNOSIS — L509 Urticaria, unspecified: Secondary | ICD-10-CM

## 2022-09-27 ENCOUNTER — Other Ambulatory Visit: Payer: Self-pay | Admitting: Cardiology

## 2022-09-27 ENCOUNTER — Other Ambulatory Visit: Payer: Self-pay | Admitting: Family Medicine

## 2022-09-27 DIAGNOSIS — I10 Essential (primary) hypertension: Secondary | ICD-10-CM

## 2022-09-27 DIAGNOSIS — R569 Unspecified convulsions: Secondary | ICD-10-CM

## 2022-10-06 ENCOUNTER — Ambulatory Visit: Payer: Medicare Other | Admitting: Cardiology

## 2022-10-06 ENCOUNTER — Encounter: Payer: Self-pay | Admitting: Cardiology

## 2022-10-06 VITALS — BP 130/63 | HR 64 | Resp 16 | Ht 70.0 in | Wt 175.8 lb

## 2022-10-06 DIAGNOSIS — I5022 Chronic systolic (congestive) heart failure: Secondary | ICD-10-CM | POA: Diagnosis not present

## 2022-10-06 DIAGNOSIS — I6523 Occlusion and stenosis of bilateral carotid arteries: Secondary | ICD-10-CM

## 2022-10-06 DIAGNOSIS — L509 Urticaria, unspecified: Secondary | ICD-10-CM | POA: Diagnosis not present

## 2022-10-06 DIAGNOSIS — E7800 Pure hypercholesterolemia, unspecified: Secondary | ICD-10-CM | POA: Diagnosis not present

## 2022-10-06 DIAGNOSIS — I25118 Atherosclerotic heart disease of native coronary artery with other forms of angina pectoris: Secondary | ICD-10-CM

## 2022-10-06 DIAGNOSIS — I10 Essential (primary) hypertension: Secondary | ICD-10-CM | POA: Diagnosis not present

## 2022-10-06 MED ORDER — CARVEDILOL 12.5 MG PO TABS: 12.5000 mg | ORAL_TABLET | Freq: Two times a day (BID) | ORAL | 3 refills | Status: DC

## 2022-10-06 MED ORDER — EZETIMIBE 10 MG PO TABS: 10.0000 mg | ORAL_TABLET | Freq: Every day | ORAL | 3 refills | Status: DC

## 2022-10-06 MED ORDER — FUROSEMIDE 40 MG PO TABS: 40.0000 mg | ORAL_TABLET | Freq: Every day | ORAL | 3 refills | Status: DC | PRN

## 2022-10-06 MED ORDER — ENTRESTO 49-51 MG PO TABS: 1.0000 | ORAL_TABLET | Freq: Two times a day (BID) | ORAL | 3 refills | Status: DC

## 2022-10-06 MED ORDER — HYDROXYZINE HCL 10 MG PO TABS: ORAL_TABLET | ORAL | 1 refills | Status: DC

## 2022-10-06 MED ORDER — ROSUVASTATIN CALCIUM 40 MG PO TABS: 40.0000 mg | ORAL_TABLET | Freq: Every day | ORAL | 3 refills | Status: DC

## 2022-10-06 MED ORDER — ISOSORBIDE MONONITRATE ER 60 MG PO TB24: 60.0000 mg | ORAL_TABLET | Freq: Every day | ORAL | 3 refills | Status: DC

## 2022-10-06 MED ORDER — POTASSIUM CHLORIDE CRYS ER 10 MEQ PO TBCR: 10.0000 meq | EXTENDED_RELEASE_TABLET | Freq: Every day | ORAL | 3 refills | Status: DC | PRN

## 2022-10-06 MED ORDER — CLOPIDOGREL BISULFATE 75 MG PO TABS: 75.0000 mg | ORAL_TABLET | Freq: Every day | ORAL | 3 refills | Status: DC

## 2022-10-06 NOTE — Progress Notes (Signed)
Follow up visit  Subjective:   Gilbert [name], male    DOB: 10/08/1950, 72 y.o.   MRN: [id number]   Chief Complaint  Patient presents with   Cardiomyopathy   Coronary Artery Disease   Hypertension   Follow-up    6 months    72 y.o. Caucasian male Caucasian male with CAD s/p MI, LM stenting 2015, ischemic cardiomyopathy, on guideline directed medical therapy, PAD s/p Left external iliac/common femoral stenting 2015, h/o left nephrectomy for renal cell carcinoma, h/o NHL, currently in remission, stage IIIa chronic kidney disease, primary hypertension, hyperlipidemia presents for follow-up of heart failure and coronary artery disease.   He does not want to quit chewing tobacco.  His last acute decompensated systolic heart failure was on 12/25/2021.  He has not had any recent chest pain and has not used his abdominal nitroglycerin.  States that since being on furosemide, dyspnea has remained stable, no further leg edema no orthopnea.  Still chewing tobacco, presents here for 63-monthoffice visit.  Past Medical History:  Diagnosis Date   Abnormal liver enzymes    Chronic alkaline phosphatase elevation since 2014   Acid reflux    CAD (coronary artery disease)    [place]   right kidney cancer   [place]    Coronary artery calcification seen on CAT scan    Coronary atherosclerosis of native coronary artery    Diffuse large cell non-Hodgkin's lymphoma ([facility] 01/07/2016   Diff[place]od[place]kin's [place] [place] 01/08[place]   Essential hypertension, benign    Family history of anesthesia complication    mother-had stroke during anesthesia   GERD (gastroesophageal reflux disease)    Gout    History of cardiac arrest 11/26/2013   PTCA/Stenting of LM & Prox LAD with 4.0 x 18 mm Xience alpine stent. Used Impella Circulatory assist device. EF= 20-25%   History of epilepsy    at least 10 years ago. 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 EF 45% 04/10/2014   Therapeutic drug monitoring    Social History   Tobacco Use   Smoking status: Former    Packs/day: 2.00    Years: 45.00    Total pack years: 90.00    Types: Cigarettes    Quit date: 08/03/2003    Years since quitting: 19.1   Smokeless tobacco: Current    Types: Chew   Tobacco comments:    quit  smoking 10 years agop  Substance Use Topics   Alcohol use: No    Alcohol/week: 0.0 standard drinks of alcohol    Comment: quit 1980  Marital Status: Divorced  Allergies   Allergies  Allergen Reactions   Oxycodone Swelling and Other (See Comments)    Tongue and lips swell    Final Medications at End of Visit    Current Outpatient Medications:    acetaminophen (TYLENOL) 500 MG tablet, Take 1,000 mg by mouth daily., Disp: ,  Rfl:    APPLE CIDER VINEGAR PO, Take 480 mg by mouth daily., Disp: , Rfl:    Cyanocobalamin (B-12) 2500 MCG TABS, Take 2,500 mcg by mouth every morning., Disp: , Rfl:    famotidine (PEPCID) 20 MG tablet, Take 20 mg by mouth 2 (two) times daily., Disp: , Rfl:    phenytoin (DILANTIN) 100 MG ER capsule, TAKE 3 CAPSULES BY MOUTH ONCE DAILY, Disp: 90 capsule, Rfl: 0   phenytoin (DILANTIN) 100 MG ER capsule, TAKE 3 CAPSULES BY MOUTH ONCE DAILY, Disp: 90 capsule, Rfl: 0   pyridOXINE (VITAMIN B-6) 100 MG tablet, Take 100 mg by mouth every morning. , Disp: , Rfl:    rosuvastatin (CRESTOR) 40 MG tablet, Take 1 tablet (40 mg total) by mouth daily., Disp: 90 tablet, Rfl: 3   Thiamine HCl (VITAMIN B-1) 250 MG tablet, Take 250 mg by mouth every  morning. , Disp: , Rfl:    carvedilol (COREG) 12.5 MG tablet, Take 1 tablet (12.5 mg total) by mouth 2 (two) times daily with a meal., Disp: 180 tablet, Rfl: 3   clopidogrel (PLAVIX) 75 MG tablet, Take 1 tablet (75 mg total) by mouth daily., Disp: 90 tablet, Rfl: 3   ezetimibe (ZETIA) 10 MG tablet, Take 1 tablet (10 mg total) by mouth daily., Disp: 90 tablet, Rfl: 3   furosemide (LASIX) 40 MG tablet, Take 1 tablet (40 mg total) by mouth daily as needed. For fluid build up or weight gain of 2 Lbs, Disp: 90 tablet, Rfl: 3   hydrOXYzine (ATARAX) 10 MG tablet, TAKE 1 TABLET BY MOUTH THREE TIMES DAILY AS NEEDED FOR ITCHING, Disp: 60 tablet, Rfl: 1   isosorbide mononitrate (IMDUR) 60 MG 24 hr tablet, Take 1 tablet (60 mg total) by mouth daily., Disp: 90 tablet, Rfl: 3   potassium chloride (KLOR-CON M) 10 MEQ tablet, Take 1 tablet (10 mEq total) by mouth daily as needed (With Furosemide). Take with Furosemide (Fluid Pill), Disp: 90 tablet, Rfl: 3   sacubitril-valsartan (ENTRESTO) 49-51 MG, Take 1 tablet by mouth 2 (two) times daily., Disp: 180 tablet, Rfl: 3   Laboratory Testing:   Lab Results  Component Value Date   NA 141 03/23/2022   K 3.9 03/23/2022   CO2 25 03/23/2022   GLUCOSE 86 03/23/2022   BUN 17 03/23/2022   CREATININE 1.42 (H) 03/23/2022   CALCIUM 9.1 03/23/2022   EGFR 56 (L) 12/10/2021   GFRNONAA 53 (L) 03/23/2022       Latest Ref Rng & Units 03/23/2022    5:19 PM 03/23/2022    5:17 PM 03/23/2022    1:11 PM  CMP  Glucose 70 - 99 mg/dL   86   BUN 8 - 23 mg/dL   17   Creatinine 0.61 - 1.24 mg/dL   1.42   Sodium 135 - 145 mmol/L 141  140  141   Potassium 3.5 - 5.1 mmol/L 3.9  4.0  3.9   Chloride 98 - 111 mmol/L   110   CO2 22 - 32 mmol/L   25   Calcium 8.9 - 10.3 mg/dL   9.1       Latest Ref Rng & Units 03/23/2022    5:19 PM 03/23/2022    5:17 PM 03/10/2022    3:17 PM  CBC  WBC 3.4 - 10.8 x10E3/uL   7.3   Hemoglobin 13.0 - 17.0 g/dL 10.5  10.9  13.7   Hematocrit 39.0 - 52.0  % 31.0  32.0  38.7  Platelets 150 - 450 x10E3/uL   193    Lab Results  Component Value Date   CHOL 193 03/10/2022   HDL 67 03/10/2022   LDLCALC 103 (H) 03/10/2022   LDLDIRECT 99 03/10/2022   TRIG 132 03/10/2022   CHOLHDL 3 12/21/2021    HEMOGLOBIN A1C Lab Results  Component Value Date   HGBA1C 5.1 08/13/2014   MPG 100 08/13/2014   Lab Results  Component Value Date   TSH 0.961 11/27/2013    ROS   Review of Systems  Cardiovascular:  Positive for dyspnea on exertion. Negative for chest pain, claudication, leg swelling and orthopnea.   Objective:      10/06/2022    2:53 PM 10/06/2022    2:43 PM 04/08/2022    1:52 PM  Vitals with BMI  Height  '5\' 10"'$  '5\' 10"'$   Weight  175 lbs 13 oz 169 lbs  BMI  0000000 0000000  Systolic [id number]  Diastolic 63 0000000 60  Pulse 64 69 56    Today's Vitals   10/06/22 1443 10/06/22 1453  BP: (!) 142/125 130/63  Pulse: 69 64  Resp: 16   SpO2: 96% 98%  Weight: 175 lb 12.8 oz (79.7 kg)   Height: '5\' 10"'$  (1.778 m)    Body mass index is 25.22 kg/m.    Physical Exam Vitals reviewed.  Neck:     Vascular: Carotid bruit (left) present. No JVD.  Cardiovascular:     Rate and Rhythm: Normal rate and regular rhythm.     Pulses:          Dorsalis pedis pulses are 0 on the right side and 0 on the left side.       Posterior tibial pulses are 1+ on the right side and 1+ on the left side.     Heart sounds: Normal heart sounds. No murmur heard. Pulmonary:     Effort: Pulmonary effort is normal.     Breath sounds: Normal breath sounds.  Musculoskeletal:     Right lower leg: No edema.     Left lower leg: No edema.    Cardiovascular studies:    Carotid artery duplex 09/15/2021: Duplex suggests stenosis in the right internal carotid artery (1-15%). Duplex suggests stenosis in the left internal carotid artery (16-49%). Duplex suggests stenosis in the left external carotid artery (<50%). Antegrade right vertebral artery flow. Antegrade left vertebral  artery flow. No significant change from 02/08/2019. Follow up in one year is appropriate if clinically indicated.  PCV ECHOCARDIOGRAM COMPLETE 01/01/2022 Mildly depressed LV systolic function with visual EF 40-45%. Left ventricle cavity is mildly dilated. Mild left ventricular hypertrophy. Doppler evidence of grade I (impaired) diastolic dysfunction, normal LAP. Left ventricle regional wall motion findings: Basal inferolateral, Basal inferior, Basal inferoseptal, Mid inferolateral, Mid inferior, Mid inferoseptal, Apical inferior, Apical septal and Apical cap hypokinesis. Mild (Grade I) mitral regurgitation. Compared to 03/11/2021 LVEF was 50-55% and now 40-45%, RWMA appear to be new, otherwise no significant change.   PCV MYOCARDIAL PERFUSION WITH LEXISCAN 01/27/2022  Narrative Lexiscan Tetrofosmin stress test 01/27/2022: 1 Day Rest/Stress protocol. Stress EKG is non-diagnostic due to baseline ST-T changes and target heart rate not achieved. Medium size, moderate to severe intensity, fixed perfusion defect involving the RCA/LCx distribution likely suggestive of infarct. Small size, mild intensity, reversible perfusion defect involving the apical septal segment. Left ventricular size dilated, reduced wall thickness, severe global hypokinesis with regional wall motion abnormalities associated with the perfusion defect, calculated LVEF 24%. Prior study  06/09/2017 noted large size scar along the inferolateral segment and mild area of ischemia in septal wall. LVEF was 17%. High risk study.  Right & Left Heart Catheterization 03/23/22:  RA 5/4, mean 4 mmHg RV 27/5, EDP 8 mmHg PA 31/17, mean 21 mmHg. PW 10/10, mean 9 mmHg. CO 3.8, CI 1.98, mildly reduced by Fick.  QP/QS 1.00.  PVR 3.16 Wood units.   LV: 171/5, EDP 13 mmHg.  Ao 165/100, mean 111 mmHg. LM: Widely patent left main and proximal LAD stent.  Mild to moderate diffuse disease in the LAD but no high-grade stenosis.  D1 is moderate to  large sized with diffuse disease, mid 50% stenosis unchanged from prior cardiac catheterization. LCx: Proximal 90% stenosis, gives origin to small OM1 which is severely diseased.  Mid segment of the circumflex is occluded and there are 2 marginals and AV groove circumflex that is seen filled by collaterals, this is again unchanged. RI: Moderate sized vessel, mild disease. RCA: The right coronary artery has high origin of RV branch, following which is completely occluded.  There is faint filling of the distal RCA with TIMI 0-1 flow.  Distal RCA and PDA and PL branches are supplied by type III collaterals from the left.  Again this anatomy is unchanged in the distal segment however occlusion of the proximal RCA is new.  Inconsequential clinically.     Impression: Findings are consistent with ischemic cardiomyopathy.  No evidence of pulm hypertension.  Cardiac output and cardiac index are borderline reduced.   EKG   EKG 10/06/2022: Normal sinus rhythm with rate of 66 bpm, normal axis, nonspecific T wave abnormality.  Consider LVH with repolarization.  Compared to 03/10/2022, no significant change.  Assessment and Recommendations:      ICD-10-CM   1. Chronic systolic heart failure (HCC)  I50.22 PCV ECHOCARDIOGRAM COMPLETE    carvedilol (COREG) 12.5 MG tablet    furosemide (LASIX) 40 MG tablet    potassium chloride (KLOR-CON M) 10 MEQ tablet    sacubitril-valsartan (ENTRESTO) 49-51 MG    2. Coronary artery disease of native artery of native heart with stable angina pectoris (HCC)  I25.118 EKG 12-Lead    PCV ECHOCARDIOGRAM COMPLETE    isosorbide mononitrate (IMDUR) 60 MG 24 hr tablet    3. Primary hypertension  I10     4. Pure hypercholesterolemia  E78.00 rosuvastatin (CRESTOR) 40 MG tablet    ezetimibe (ZETIA) 10 MG tablet    5. Bilateral carotid artery stenosis  I65.23 PCV CAROTID DUPLEX (BILATERAL)    6. Urticaria  L50.9 hydrOXYzine (ATARAX) 10 MG tablet     Meds ordered this encounter   Medications   rosuvastatin (CRESTOR) 40 MG tablet    Sig: Take 1 tablet (40 mg total) by mouth daily.    Dispense:  90 tablet    Refill:  3    Discontinue atorvastatin   carvedilol (COREG) 12.5 MG tablet    Sig: Take 1 tablet (12.5 mg total) by mouth 2 (two) times daily with a meal.    Dispense:  180 tablet    Refill:  3   clopidogrel (PLAVIX) 75 MG tablet    Sig: Take 1 tablet (75 mg total) by mouth daily.    Dispense:  90 tablet    Refill:  3   ezetimibe (ZETIA) 10 MG tablet    Sig: Take 1 tablet (10 mg total) by mouth daily.    Dispense:  90 tablet    Refill:  3  furosemide (LASIX) 40 MG tablet    Sig: Take 1 tablet (40 mg total) by mouth daily as needed. For fluid build up or weight gain of 2 Lbs    Dispense:  90 tablet    Refill:  3   hydrOXYzine (ATARAX) 10 MG tablet    Sig: TAKE 1 TABLET BY MOUTH THREE TIMES DAILY AS NEEDED FOR ITCHING    Dispense:  60 tablet    Refill:  1   isosorbide mononitrate (IMDUR) 60 MG 24 hr tablet    Sig: Take 1 tablet (60 mg total) by mouth daily.    Dispense:  90 tablet    Refill:  3   potassium chloride (KLOR-CON M) 10 MEQ tablet    Sig: Take 1 tablet (10 mEq total) by mouth daily as needed (With Furosemide). Take with Furosemide (Fluid Pill)    Dispense:  90 tablet    Refill:  3   sacubitril-valsartan (ENTRESTO) 49-51 MG    Sig: Take 1 tablet by mouth 2 (two) times daily.    Dispense:  180 tablet    Refill:  3    Medications Discontinued During This Encounter  Medication Reason   atorvastatin (LIPITOR) 80 MG tablet Ineffective   ezetimibe (ZETIA) 10 MG tablet Reorder   sacubitril-valsartan (ENTRESTO) 49-51 MG Reorder   potassium chloride (KLOR-CON M) 10 MEQ tablet Reorder   furosemide (LASIX) 40 MG tablet Reorder   isosorbide mononitrate (IMDUR) 60 MG 24 hr tablet Reorder   clopidogrel (PLAVIX) 75 MG tablet Reorder   hydrOXYzine (ATARAX) 10 MG tablet Reorder   carvedilol (COREG) 12.5 MG tablet Reorder     Gilbert [name] is a 72  y.o.Caucasian male with CAD s/p MI, LM stenting 2015, ischemic cardiomyopathy, on guideline directed medical therapy, PAD s/p Left external iliac/common femoral stenting 2015, h/o left nephrectomy for renal cell carcinoma, h/o NHL, currently in remission, stage IIIa chronic kidney disease, primary hypertension, hyperlipidemia presents for follow-up of heart failure and coronary artery disease.   He does not want to quit chewing tobacco.  His last acute decompensated systolic heart failure was on 12/25/2021.  1. Chronic systolic heart failure ([place]) His LVEF is 40 to 45%, he has not had any further acute decompensated heart failure although he has been taking furosemide on a daily basis and states that stopping furosemide will increase his leg edema and dyspnea.  I would like to recheck his echocardiogram to reevaluate his LV systolic function as he is on guideline directed medical therapy now and has not had any recent hospitalization on heart failure.  2. Coronary artery disease of native artery of native heart with stable angina pectoris (HCC) Stable angina and no recent use of S/L NTG. Left heart catheterization on 03/23/2022 and again revealing chronically occluded RCA and circumflex in the distribution of the abnormal nuclear stress test, this was also seen previously, overall no target for revascularization, recommended medical therapy.  3. Primary hypertension Blood pressure is well-controlled on present medical regimen, no changes needed.  I continued his medications and refilled the prescription.  4. Pure hypercholesterolemia I reviewed his recently performed lipid profile testing, LDL is risen to >70 (103) and is presently on 80 mg of Lipitor and 10 mg of Zetia.  Will discontinue Lipitor, he has a 35-monthsupply at home, he can transition to Crestor 40 mg daily and continue Zetia.  We can recheck his lipids in 6 to 8 months.  5. Bilateral carotid artery stenosis He has mild bilateral carotid  artery stenosis, he will continue annual surveillance.  6. Urticaria Patient has idiopathic urticaria, I have prescribed him hydroxyzine which has been helping him, I refilled the prescription.   I will see him back in 6 months for follow-up.    Adrian Prows, MD, [facility] 10/06/2022, 9:55 PM Office: [phone] Fax: [phone] Pager: [phone]

## 2022-11-01 ENCOUNTER — Other Ambulatory Visit: Payer: Self-pay | Admitting: Family Medicine

## 2022-11-01 DIAGNOSIS — R569 Unspecified convulsions: Secondary | ICD-10-CM

## 2022-11-18 ENCOUNTER — Ambulatory Visit: Payer: Medicare Other

## 2022-11-18 DIAGNOSIS — I5022 Chronic systolic (congestive) heart failure: Secondary | ICD-10-CM

## 2022-11-18 DIAGNOSIS — I6523 Occlusion and stenosis of bilateral carotid arteries: Secondary | ICD-10-CM

## 2022-11-18 DIAGNOSIS — I25118 Atherosclerotic heart disease of native coronary artery with other forms of angina pectoris: Secondary | ICD-10-CM

## 2022-11-19 NOTE — Progress Notes (Signed)
Mild decrease in heart function but overall stable from last year.No change needed

## 2022-11-19 NOTE — Progress Notes (Signed)
Called patient to inform him about is echo results. Patient understood

## 2022-11-22 ENCOUNTER — Other Ambulatory Visit: Payer: Self-pay | Admitting: Cardiology

## 2022-11-23 NOTE — Progress Notes (Signed)
Let him know very mild blockage left and no change from previous. Stress test is okay as well

## 2022-11-23 NOTE — Progress Notes (Signed)
Gave results, he acknowledged understanding and had no further questions.

## 2022-12-06 ENCOUNTER — Other Ambulatory Visit: Payer: Self-pay | Admitting: Cardiology

## 2022-12-06 ENCOUNTER — Other Ambulatory Visit: Payer: Self-pay | Admitting: Family Medicine

## 2022-12-06 DIAGNOSIS — R569 Unspecified convulsions: Secondary | ICD-10-CM

## 2022-12-14 ENCOUNTER — Other Ambulatory Visit: Payer: Self-pay | Admitting: Cardiology

## 2022-12-14 DIAGNOSIS — L509 Urticaria, unspecified: Secondary | ICD-10-CM

## 2023-01-04 ENCOUNTER — Other Ambulatory Visit: Payer: Self-pay | Admitting: Family Medicine

## 2023-01-04 DIAGNOSIS — R569 Unspecified convulsions: Secondary | ICD-10-CM

## 2023-01-04 NOTE — Telephone Encounter (Signed)
Dilantin 100 mg  LOV: 12/30/21 Last Refill:12/06/22 Upcoming appt: none on file

## 2023-01-05 NOTE — Telephone Encounter (Signed)
refilled 

## 2023-01-18 ENCOUNTER — Other Ambulatory Visit: Payer: Self-pay | Admitting: Cardiology

## 2023-01-18 DIAGNOSIS — L509 Urticaria, unspecified: Secondary | ICD-10-CM

## 2023-01-26 ENCOUNTER — Telehealth: Payer: Self-pay | Admitting: Family Medicine

## 2023-01-26 NOTE — Telephone Encounter (Signed)
Placed in Dr.Greene folder. 

## 2023-01-26 NOTE — Telephone Encounter (Addendum)
Caller name: House Call Visit Summary   On DPR?: Yes  Call back number: [phone]  Provider they see: Shade Flood, MD  Reason for call:  Charge sheet attached placed in front bin

## 2023-02-02 ENCOUNTER — Other Ambulatory Visit: Payer: Self-pay

## 2023-02-02 ENCOUNTER — Other Ambulatory Visit: Payer: Self-pay | Admitting: Family Medicine

## 2023-02-02 DIAGNOSIS — R569 Unspecified convulsions: Secondary | ICD-10-CM

## 2023-02-02 MED ORDER — PHENYTOIN SODIUM EXTENDED 100 MG PO CAPS: 300.0000 mg | ORAL_CAPSULE | Freq: Every day | ORAL | 0 refills | Status: DC

## 2023-02-02 NOTE — Telephone Encounter (Signed)
Sent in a 30 day supply . Pt was notified he needed an apt and has been scheduled

## 2023-02-24 ENCOUNTER — Ambulatory Visit (INDEPENDENT_AMBULATORY_CARE_PROVIDER_SITE_OTHER): Payer: Medicare Other | Admitting: Family Medicine

## 2023-02-24 ENCOUNTER — Encounter: Payer: Self-pay | Admitting: Family Medicine

## 2023-02-24 VITALS — BP 128/70 | HR 69 | Temp 98.3°F | Ht 67.0 in | Wt 168.2 lb

## 2023-02-24 DIAGNOSIS — L509 Urticaria, unspecified: Secondary | ICD-10-CM

## 2023-02-24 DIAGNOSIS — R21 Rash and other nonspecific skin eruption: Secondary | ICD-10-CM | POA: Diagnosis not present

## 2023-02-24 MED ORDER — HYDROXYZINE HCL 10 MG PO TABS: ORAL_TABLET | ORAL | 1 refills | Status: DC

## 2023-02-24 MED ORDER — TRIAMCINOLONE ACETONIDE 0.1 % EX CREA: 1.0000 | TOPICAL_CREAM | Freq: Two times a day (BID) | CUTANEOUS | 0 refills | Status: DC

## 2023-02-24 NOTE — Progress Notes (Signed)
Subjective:  Patient ID: Gilbert [name], male    DOB: Nov 15, 1950  Age: 72 y.o. MRN: [id number]  CC:  Chief Complaint  Patient presents with   Pruritis    Pt has a lot of bumps on his skin that are itchy and he is itching himself raw, notes this has been like this for 3-4 months     HPI Gilbert [name] presents for concern above.  Initial physical changed to acute visit today. Last visit with me in [place] 2023.  Followed by cardiology with history of CHF, CAD, carotid artery stenosis, hypertension and hyperlipidemia.   Pruritus: Past 3 to 4 months. Various areas. Started with a wart on side of leg. Treated with pound W, then stated with itchy bumps on body abdomen, back, arms - formed small pustules and drained, primarily itching. Some redness after itching areas. Urticaria discussed at his March visit with cardiology. 10 mg hydroxyzine prescribed - 3 times per day - initial help with itching, then not working. Nothing makes better, scratching more. Lives with dtr and her son, they do not have rash. Dog at home, no fleas.  No oral or genital lesions.  No new meds in past 6 months.  Dove soap for men to bathe for years.  No fabric softener. Tide detergent now. No new changes. Itching on hands as well, back of hands.  No dermatologist.  On dilantin 300mg  total per day - longstanding.  Tx: antibiotic ointment - daily past few months. 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   Hematuria 10/29/2015   Dysphagia, pharyngoesophageal phase     Early satiety    Esophageal stricture    Gastritis and gastroduodenitis    PAD (peripheral artery disease) (HCC) 05/28/2014   Ischemic cardiomyopathy 04/03/2014   History of left heart catheterization (LHC) 02/26/2014   Arterial bleed, intraoperative 02/26/2014   Seizures (HCC) 11/27/2013   Essential hypertension 11/27/2013   Hypomagnesemia 11/27/2013   Coronary artery disease involving native coronary artery of native heart without angina pectoris 11/26/2013   Renal cancer (HCC) 10/19/2013   Coronary artery calcification 09/25/2013   Renal cyst 08/30/2013   Loss of weight 08/03/2013   Past Medical History:  Diagnosis Date   Abnormal liver enzymes    Chronic alkaline phosphatase elevation since 2014   Acid reflux    CAD (coronary artery disease)    Cancer (HCC) 08/2013   right kidney cancer   Cancer (HCC)    Coronary artery calcification seen on CAT scan    Coronary atherosclerosis of native coronary artery    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 Used Impella Circulatory assist device. EF= 20-25%   History of epilepsy  at least 10 years ago. 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 EF 45% 04/10/2014   Therapeutic drug monitoring    Past Surgical History:  Procedure Laterality Date   BALLOON DILATION N/A 01/21/2015   Procedure: BALLOON DILATION;  Surgeon: Iva Boop, MD;  Location: [facility] ENDOSCOPY;  Service: Endoscopy;  Laterality: N/A;   CARDIAC CATHETERIZATION N/A 06/10/2015   Procedure: Left Heart Cath and Coronary Angiography;  Surgeon: Yates Decamp, MD;  Location: [facility] INVASIVE CV LAB;  Service: Cardiovascular;  Laterality: N/A;   CORONARY ANGIOPLASTY WITH STENT PLACEMENT  08/02/2013   Left main coronary artery stenting on emergent basis along with circulatory support with Impella device through left leg. With 4.0 x 18 mm Xience alpine stent   EAR CYST EXCISION Left 05/31/2018   Procedure: EXCISION LEFT POSTERIOR EAR TUMOR;  Surgeon: Newman Pies, MD;  Location: [facility] OR;  Service: ENT;  Laterality: Left;   ESOPHAGOGASTRODUODENOSCOPY     ESOPHAGOGASTRODUODENOSCOPY N/A 01/21/2015   Procedure: ESOPHAGOGASTRODUODENOSCOPY (EGD) with possible dilation.;  Surgeon: Iva Boop, MD;  Location: [facility] ENDOSCOPY;  Service: Endoscopy;  Laterality: N/A;   EYE SURGERY Left 2 weeks ago   torn retina   ILIAC ARTERY STENT Left 05/28/2014   dr Jacinto Halim   LEFT HEART CATHETERIZATION WITH CORONARY ANGIOGRAM N/A 11/27/2013   Procedure: LEFT HEART CATHETERIZATION WITH CORONARY ANGIOGRAM;  Surgeon: Pamella Pert, MD;  Location: [facility] CATH LAB;  Service: Cardiovascular;  Laterality: N/A;   LOWER EXTREMITY ANGIOGRAM N/A 02/26/2014   Procedure: LOWER EXTREMITY ANGIOGRAM;  Surgeon: Pamella Pert, MD;  Location: [facility] CATH LAB;  Service: Cardiovascular;  Laterality: N/A;   LOWER EXTREMITY ANGIOGRAM N/A 05/28/2014   Procedure: LOWER EXTREMITY ANGIOGRAM;  Surgeon: Pamella Pert, MD;  Location: [facility] CATH LAB;  Service: Cardiovascular;  Laterality: N/A;   NEPHRECTOMY  11/30/2013   PERCUTANEOUS CORONARY STENT INTERVENTION (PCI-S)  11/27/2013   Procedure: PERCUTANEOUS CORONARY STENT INTERVENTION  (PCI-S);  Surgeon: Pamella Pert, MD;  Location: [facility] CATH LAB;  Service: Cardiovascular;;   RIGHT/LEFT HEART CATH AND CORONARY ANGIOGRAPHY N/A 03/23/2022   Procedure: RIGHT/LEFT HEART CATH AND CORONARY ANGIOGRAPHY;  Surgeon: Yates Decamp, MD;  Location: [facility] INVASIVE CV LAB;  Service: Cardiovascular;  Laterality: N/A;   ROBOT ASSISTED LAPAROSCOPIC NEPHRECTOMY Right 10/19/2013   Procedure: ROBOTIC ASSISTED LAPAROSCOPIC NEPHRECTOMY,  EXTENSIVE ADHESIOLYSIS;  Surgeon: Sebastian Ache, MD;  Location: [facility] ORS;  Service: Urology;  Laterality: Right;   SKIN FULL THICKNESS GRAFT Left 05/31/2018   Procedure: SKIN GRAFT FULL THICKNESS FROM LEFT ABDOMEN TO LEFT EAR;  Surgeon: Newman Pies, MD;  Location: [facility] OR;  Service: ENT;  Laterality: Left;   Allergies  Allergen Reactions   Oxycodone Swelling and Other (See Comments)    Tongue and lips swell   Prior to Admission medications   Medication Sig Start Date End Date Taking? Authorizing Provider  acetaminophen (TYLENOL) 500 MG tablet Take 1,000 mg by mouth daily.  Yes [provider]  APPLE CIDER VINEGAR PO Take 480 mg by mouth daily.   Yes [provider]  carvedilol (COREG) 12.5 MG tablet Take 1 tablet (12.5 mg total) by mouth 2 (two) times daily with a meal. 10/06/22  Yes Yates Decamp, MD  clopidogrel (PLAVIX) 75 MG tablet Take 1 tablet (75 mg total) by mouth daily. 10/06/22  Yes Yates Decamp, MD  Cyanocobalamin (B-12) 2500 MCG TABS Take 2,500 mcg by mouth every morning.   Yes [provider]  ezetimibe (ZETIA) 10 MG tablet Take 1 tablet (10 mg total) by mouth daily. 10/06/22  Yes Yates Decamp, MD  famotidine (PEPCID) 20 MG tablet Take 20 mg by mouth 2 (two) times daily.   Yes [provider]  furosemide (LASIX) 40 MG tablet Take 1 tablet (40 mg total) by mouth daily as needed. For fluid build up or weight gain of 2 Lbs 10/06/22  Yes Yates Decamp, MD  isosorbide mononitrate (IMDUR) 60 MG 24 hr tablet Take 1 tablet (60 mg total) by mouth  daily. 10/06/22 10/01/23 Yes Yates Decamp, MD  phenytoin (DILANTIN) 100 MG ER capsule TAKE 3 CAPSULES BY MOUTH ONCE DAILY 08/03/22  Yes Shade Flood, MD  phenytoin (DILANTIN) 100 MG ER capsule Take 3 capsules (300 mg total) by mouth daily. 02/02/23  Yes Shade Flood, MD  potassium chloride (KLOR-CON M) 10 MEQ tablet Take 1 tablet (10 mEq total) by mouth daily as needed (With Furosemide). Take with Furosemide (Fluid Pill) 10/06/22  Yes Yates Decamp, MD  pyridOXINE (VITAMIN B-6) 100 MG tablet Take 100 mg by mouth every morning.    Yes [provider]  rosuvastatin (CRESTOR) 40 MG tablet Take 1 tablet (40 mg total) by mouth daily. 10/06/22 10/01/23 Yes Yates Decamp, MD  sacubitril-valsartan (ENTRESTO) 49-51 MG Take 1 tablet by mouth 2 (two) times daily. 10/06/22  Yes Yates Decamp, MD  Thiamine HCl (VITAMIN B-1) 250 MG tablet Take 250 mg by mouth every morning.    Yes [provider]  hydrOXYzine (ATARAX) 10 MG tablet TAKE 1 TABLET BY MOUTH THREE TIMES DAILY AS NEEDED FOR ITCHING Patient not taking: Reported on 02/24/2023 01/18/23   Yates Decamp, MD   Social History   Socioeconomic History   Marital status: Divorced    Spouse name: Not on file   Number of children: 2   Years of education: Not on file   Highest education level: Not on file  Occupational History   Occupation: retired  Tobacco Use   Smoking status: Former    Current packs/day: 0.00    Average packs/day: 2.0 packs/day for 45.0 years (90.0 ttl pk-yrs)    Types: Cigarettes    Start date: 08/02/1958    Quit date: 08/03/2003    Years since quitting: 19.5   Smokeless tobacco: Current    Types: Chew   Tobacco comments:    quit  smoking 10 years agop  Vaping Use   Vaping status: Never Used  Substance and Sexual Activity   Alcohol use: No    Alcohol/week: 0.0 standard drinks of alcohol    Comment: quit 1980   Drug use: No    Comment: none in past 30 years    Sexual activity: Not Currently  Other Topics Concern   Not on file   Social History Narrative   ** Merged History Encounter **       Social Determinants of Health   Financial Resource Strain: Low Risk  (03/11/2022)   Overall  Financial Resource Strain (CARDIA)    Difficulty of Paying Living Expenses: Not hard at all  Food Insecurity: No Food Insecurity (03/11/2022)   Hunger Vital Sign    Worried About Running Out of Food in the Last Year: Never true    Ran Out of Food in the Last Year: Never true  Transportation Needs: No Transportation Needs (03/11/2022)   PRAPARE - Administrator, Civil Service (Medical): No    Lack of Transportation (Non-Medical): No  Physical Activity: Insufficiently Active (03/11/2022)   Exercise Vital Sign    Days of Exercise per Week: 3 days    Minutes of Exercise per Session: 30 min  Stress: No Stress Concern Present (03/11/2022)   [employer] of Occupational Health - Occupational Stress Questionnaire    Feeling of Stress : Not at all  Social Connections: Socially Isolated (03/11/2022)   Social Connection and Isolation Panel [NHANES]    Frequency of Communication with Friends and Family: Three times a week    Frequency of Social Gatherings with Friends and Family: Three times a week    Attends Religious Services: Never    Active Member of Clubs or Organizations: No    Attends Banker Meetings: Never    Marital Status: Divorced  Catering manager Violence: Not At Risk (03/11/2022)   Humiliation, Afraid, Rape, and Kick questionnaire    Fear of Current or Ex-Partner: No    Emotionally Abused: No    Physically Abused: No    Sexually Abused: No    Review of Systems   Objective:   Vitals:   02/24/23 1422  BP: 128/70  Pulse: 69  Temp: 98.3 F (36.8 C)  TempSrc: Temporal  SpO2: 96%  Weight: 168 lb 3.2 oz (76.3 kg)  Height: 5\' 7"  (1.702 m)     Physical Exam Constitutional:      General: He is not in acute distress.    Appearance: Normal appearance. He is well-developed.  HENT:      Head: Normocephalic and atraumatic.  Cardiovascular:     Rate and Rhythm: Normal rate.  Pulmonary:     Effort: Pulmonary effort is normal.  Skin:    Comments: Diffuse excoriated rash of arms bilateral, few areas on abdomen, chest, sparing of mid back.  Areas of lower legs bilaterally.  No interdigital lesions seen in hand.  Few raw appearing areas but no surrounding significant erythema or discharge.  See photos.  Neurological:     Mental Status: He is alert and oriented to person, place, and time.  Psychiatric:        Mood and Affect: Mood normal.             Assessment & Plan:  Gilbert [name] is a 72 y.o. male . Rash and nonspecific skin eruption - Plan: hydrOXYzine (ATARAX) 10 MG tablet, Ambulatory referral to Dermatology, triamcinolone cream (KENALOG) 0.1 %  Urticaria - Plan: hydrOXYzine (ATARAX) 10 MG tablet, Ambulatory referral to Dermatology, triamcinolone cream (KENALOG) 0.1 %  Diffuse persistent rash.  Question dry skin dermatitis component versus contact dermatitis.  Multiple areas with excoriation but no apparent sign of infection at this time.  RTC precautions were given if signs or symptoms of infection.  Does not have typical appearance of scabies given lack of interdigital lesions and chronicity of rash.  Sparing of mid back, out of area of reach.  No mucosal or genital lesions.  Differential includes drug rash and he is on Dilantin but has been on that  for some time.  No apparent systemic symptoms.  Referral placed to dermatology.  Hydroxyzine refilled to help with itching with potential side effects and risk discussed.  Triamcinolone topical to affected areas if needed.  Dry skin care reviewed, hydrating lotion such as Aveeno or Eucerin throughout the day.  Continue Dove or other mild soap.  Switch detergent to Tide sensitive or other detergent that is dye free/fragrance free.    RTC precautions if acute worsening symptoms.  Plan for close follow-up to review other  chronic medical history and concerns, and can reschedule physical in the coming months as well.  Meds ordered this encounter  Medications   hydrOXYzine (ATARAX) 10 MG tablet    Sig: TAKE 1 TABLET BY MOUTH THREE TIMES DAILY AS NEEDED FOR ITCHING    Dispense:  60 tablet    Refill:  1   triamcinolone cream (KENALOG) 0.1 %    Sig: Apply 1 Application topically 2 (two) times daily.    Dispense:  30 g    Refill:  0   Patient Instructions  As we discussed rash can come from multiple causes, potentially even from similar medications.  I will not change anything at this time, but I will have you see dermatology and if they feel like it is due to your medications, specifically Dilantin we Massman need to stop that temporarily and discuss alternatives with neurology.  You Mione have a component of dry skin as well.  Try Aveeno lotion throughout the day or Eucerin lotion for dry skin areas.  Triamcinolone cream if needed for itchy areas but use that only as needed.  I did write for hydroxyzine again to help with itching but watch for side effects including any increased lightheadedness or dizziness with that medication.  I do recommend a mild soap, Marice Potter is fine for now.  Also I recommend a detergent that is allergy free, dye free such as Tide sensitive.  Recheck in 1 month, sooner if any new or worsening symptoms.  We can review other medications at follow-up visit.   Pruritus Pruritus is an itchy feeling on the skin. One of the most common causes is dry skin, but many different things can cause itching. Most cases of itching do not require medical attention. Sometimes itchy skin can turn into a rash or a secondary infection. Follow these instructions at home: Skin care  Do not use scented soaps, detergents, perfumes, and cosmetic products. Instead, use gentle, unscented versions of these items. Apply moisturizing creams to your skin frequently, at least twice daily. Apply immediately after bathing while skin  is still wet. Take medicines or apply medicated creams only as told by your health care provider. This Seidner include: Corticosteroid cream or topical calcineurin inhibitor. Anti-itch lotions containing urea, camphor, or menthol. Oral antihistamines. Do not take hot showers or baths, which can make itching worse. A short, cool shower Riolo help with itching as long as you apply moisturizing lotion after the shower. Apply a cool, wet cloth (cool compress) to the affected areas. You Rapaport take lukewarm baths with one of the following: Epsom salts. You can get these at your local pharmacy or grocery store. Follow the instructions on the packaging. Baking soda. Pour a small amount into the bath as told by your health care provider. Colloidal oatmeal. You can get this at your local pharmacy or grocery store. Follow the instructions on the packaging. Do not scratch your skin. General instructions Avoid wearing tight clothes. Keep a journal to help  find out what is causing your itching. Write down: What you eat and drink. What cosmetic products you use. What soaps or detergents you use. What you wear, including jewelry. Use a humidifier. This keeps the air moist, which helps to prevent dry skin. Be aware of any changes in your itchiness. Tell your health care provider about any changes. Contact a health care provider if: The itching does not go away after several days. You notice redness, warmth, or drainage on the skin where you have scratched. You are unusually thirsty or urinating more than normal. Your skin tingles or feels numb. Your skin or the white parts of your eyes turn yellow (jaundice). You feel weak. You have any of the following: Night sweats. Tiredness (fatigue). Weight loss. Abdominal pain. Summary Pruritus is an itchy feeling on the skin. One of the most common causes is dry skin, but many different conditions and factors can cause itching. Apply moisturizing creams to your skin  frequently, at least twice daily. Apply immediately after bathing while skin is still wet. Take medicines or apply medicated creams only as told by your health care provider. Do not take hot showers or baths. Do not use scented soaps, detergents, perfumes, or cosmetic products. Keep a journal to help find out what is causing your itching. This information is not intended to replace advice given to you by your health care provider. Make sure you discuss any questions you have with your health care provider. Document Revised: 08/26/2021 Document Reviewed: 08/26/2021 Elsevier Patient Education  2024 Elsevier Inc.   Rash, Adult A rash is a breakout of spots or blotches on the skin. It can affect the way the skin looks and feels. Many things can cause a rash. Common causes include: Viral infections. These include colds, measles, and hand, foot, and mouth disease. Bacterial infections. These include scarlet fever and impetigo. Fungal infections. These include athlete's foot, ringworm, and yeast rashes. Skin irritation. This Arkin be from heat rash, exposure to moisture or friction for a long time (intertrigo), or exposure to soap or skin care products (eczema). Allergic reactions. These Gradillas be caused by foods, medicines, or things like poison ivy. Some rashes Meno go away after a few days. Others Cotterill last for a few weeks. The goal of treatment is to stop the itching and keep the rash from spreading. Follow these instructions at home: Medicine Take or apply over-the-counter and prescription medicines only as told by your health care provider. These Batson include: Corticosteroids. These can help treat red or swollen skin. They Teaney be given as creams or as medicines to take by mouth (orally). Anti-itch lotions. Allergy medicines. Pain medicine. Antifungal medicine if the rash is from a fungal infection. Antibiotics if you have an infection.  Skin care Apply cool, wet cloths (compresses) to the  affected areas. Do not scratch or rub your skin. Avoid covering the rash. Keep it exposed to air as often as you can. Managing itching and discomfort Avoid hot showers and baths. These can make itching worse. A cold shower Platz help. Try taking a bath with: Epsom salts. You can get these at your local pharmacy or grocery store. Follow the instructions on the package. Baking soda. Pour a small amount into the bath as told by your provider. Colloidal oatmeal. You can get this at your local pharmacy or grocery store. Follow the instructions on the package. Try putting baking soda paste on your skin. Stir water into baking soda until it becomes like a paste.  Try using calamine lotion or cortisone cream to help with itchiness. Keep cool. Stay out of the sun. Sweating and being hot can make itching worse. General instructions  Rest as needed. Drink enough fluid to keep your pee (urine) pale yellow. Wear loose-fitting clothes. Avoid scented soaps, detergents, and perfumes. Use gentle soaps, detergents, perfumes, and cosmetics. Avoid the things that cause your rash (triggers). Keep a journal to help keep track of your triggers. Write down: What you eat. What cosmetics you use. What you drink. What you wear. This includes jewelry. Contact a health care provider if: You sweat at night more than normal. You pee (urinate) more or less than normal, or your pee is a darker color than normal. Your eyes become sensitive to light. Your skin or the white parts of your eyes turn yellow (jaundice). Your skin tingles or is numb. You get painful blisters in your nose or mouth. Your rash does not go away after a few days, or it gets worse. You are more tired or thirsty than normal. You have new or worse symptoms. These Rettinger include: Pain in your abdomen. Fever. Diarrhea or vomiting. Weakness or weight loss. Get help right away if: You get confused. You have a severe headache, a stiff neck, or severe  joint pain or stiffness. You become very sleepy or not responsive. You have a seizure. This information is not intended to replace advice given to you by your health care provider. Make sure you discuss any questions you have with your health care provider. Document Revised: 05/07/2022 Document Reviewed: 05/07/2022 Elsevier Patient Education  2024 Elsevier Inc.     Signed,   Meredith Staggers, MD [facility] 02/25/23 3:22 PM

## 2023-02-24 NOTE — Patient Instructions (Addendum)
As we discussed rash can come from multiple causes, potentially even from similar medications.  I will not change anything at this time, but I will have Gilbert see dermatology and if they feel like it is due to your medications, specifically Dilantin we [name] need to stop that temporarily and discuss alternatives with neurology.  Gilbert [name] have a component of dry skin as well.  Try Aveeno lotion throughout Gilbert day or Eucerin lotion for dry skin areas.  Triamcinolone cream if needed for itchy areas but use that only as needed.  I did write for hydroxyzine again to help with itching but watch for side effects including any increased lightheadedness or dizziness with that medication.  I do recommend a mild soap, Gilbert [name] is fine for now.  Also I recommend a detergent that is allergy free, dye free such as Tide sensitive.  Recheck in 1 month, sooner if any new or worsening symptoms.  We can review other medications at follow-up visit.   Pruritus Pruritus is an itchy feeling on Gilbert skin. One of Gilbert most common causes is dry skin, but many different things can cause itching. Most cases of itching do not require medical attention. Sometimes itchy skin can turn into a rash or a secondary infection. Follow Gilbert instructions at home: Skin care  Do not use scented soaps, detergents, perfumes, and cosmetic products. Instead, use gentle, unscented versions of Gilbert items. Apply moisturizing creams to your skin frequently, at least twice daily. Apply immediately after bathing while skin is still wet. Take medicines or apply medicated creams only as told by your health care provider. This Gilbert [name] include: Corticosteroid cream or topical calcineurin inhibitor. Anti-itch lotions containing urea, camphor, or menthol. Oral antihistamines. Do not take hot showers or baths, which can make itching worse. A short, cool shower Gilbert [name] help with itching as long as Gilbert apply moisturizing lotion after Gilbert shower. Apply a cool, wet cloth (cool  compress) to Gilbert affected areas. Gilbert [name] take lukewarm baths with one of Gilbert following: Epsom salts. Gilbert can get Gilbert at your local pharmacy or grocery store. Follow Gilbert instructions on Gilbert packaging. Baking soda. Pour a small amount into Gilbert bath as told by your health care provider. Colloidal oatmeal. Gilbert can get this at your local pharmacy or grocery store. Follow Gilbert instructions on Gilbert packaging. Do not scratch your skin. General instructions Avoid wearing tight clothes. Keep a journal to help find out what is causing your itching. Write down: What Gilbert eat and drink. What cosmetic products Gilbert use. What soaps or detergents Gilbert use. What Gilbert wear, including jewelry. Use a humidifier. This keeps Gilbert air moist, which helps to prevent dry skin. Be aware of any changes in your itchiness. Tell your health care provider about any changes. Contact a health care provider if: Gilbert itching does not go away after several days. Gilbert notice redness, warmth, or drainage on Gilbert skin where Gilbert have scratched. Gilbert are unusually thirsty or urinating more than normal. Your skin tingles or feels numb. Your skin or Gilbert white parts of your eyes turn yellow (jaundice). Gilbert feel weak. Gilbert have any of Gilbert following: Night sweats. Tiredness (fatigue). Weight loss. Abdominal pain. Summary Pruritus is an itchy feeling on Gilbert skin. One of Gilbert most common causes is dry skin, but many different conditions and factors can cause itching. Apply moisturizing creams to your skin frequently, at least twice daily. Apply immediately after bathing while skin is still wet. Take medicines or apply medicated  creams only as told by your health care provider. Do not take hot showers or baths. Do not use scented soaps, detergents, perfumes, or cosmetic products. Keep a journal to help find out what is causing your itching. This information is not intended to replace advice given to Gilbert by your health care provider. Make sure  Gilbert discuss any questions Gilbert have with your health care provider. Document Revised: 08/26/2021 Document Reviewed: 08/26/2021 Elsevier Patient Education  2024 [employer]   Rash, Adult A rash is a breakout of spots or blotches on Gilbert skin. It can affect Gilbert way Gilbert skin looks and feels. Many things can cause a rash. Common causes include: Viral infections. Gilbert include colds, measles, and hand, foot, and mouth disease. Bacterial infections. Gilbert include scarlet fever and impetigo. Fungal infections. Gilbert include athlete's foot, ringworm, and yeast rashes. Skin irritation. This Gilbert [name] be from heat rash, exposure to moisture or friction for a long time (intertrigo), or exposure to soap or skin care products (eczema). Allergic reactions. Gilbert [name] be caused by foods, medicines, or things like poison ivy. Some rashes Gilbert [name] go away after a few days. Others Gilbert [name] last for a few weeks. Gilbert goal of treatment is to stop Gilbert itching and keep Gilbert rash from spreading. Follow Gilbert instructions at home: Medicine Take or apply over-Gilbert-counter and prescription medicines only as told by your health care provider. Gilbert Gilbert [name] include: Corticosteroids. Gilbert can help treat red or swollen skin. They Gilbert [name] be given as creams or as medicines to take by mouth (orally). Anti-itch lotions. Allergy medicines. Pain medicine. Antifungal medicine if Gilbert rash is from a fungal infection. Antibiotics if Gilbert have an infection.  Skin care Apply cool, wet cloths (compresses) to Gilbert affected areas. Do not scratch or rub your skin. Avoid covering Gilbert rash. Keep it exposed to air as often as Gilbert can. Managing itching and discomfort Avoid hot showers and baths. Gilbert can make itching worse. A cold shower Gilbert [name] help. Try taking a bath with: Epsom salts. Gilbert can get Gilbert at your local pharmacy or grocery store. Follow Gilbert instructions on Gilbert package. Baking soda. Pour a small amount into Gilbert bath as told by your  provider. Colloidal oatmeal. Gilbert can get this at your local pharmacy or grocery store. Follow Gilbert instructions on Gilbert package. Try putting baking soda paste on your skin. Stir water into baking soda until it becomes like a paste. Try using calamine lotion or cortisone cream to help with itchiness. Keep cool. Stay out of Gilbert sun. Sweating and being hot can make itching worse. General instructions  Rest as needed. Drink enough fluid to keep your pee (urine) pale yellow. Wear loose-fitting clothes. Avoid scented soaps, detergents, and perfumes. Use gentle soaps, detergents, perfumes, and cosmetics. Avoid Gilbert things that cause your rash (triggers). Keep a journal to help keep track of your triggers. Write down: What Gilbert eat. What cosmetics Gilbert use. What Gilbert drink. What Gilbert wear. This includes jewelry. Contact a health care provider if: Gilbert sweat at night more than normal. Gilbert pee (urinate) more or less than normal, or your pee is a darker color than normal. Your eyes become sensitive to light. Your skin or Gilbert white parts of your eyes turn yellow (jaundice). Your skin tingles or is numb. Gilbert get painful blisters in your nose or mouth. Your rash does not go away after a few days, or it gets worse. Gilbert are more tired or thirsty than normal. Gilbert have new or worse  symptoms. Gilbert [name] include: Pain in your abdomen. Fever. Diarrhea or vomiting. Weakness or weight loss. Get help right away if: Gilbert get confused. Gilbert have a severe headache, a stiff neck, or severe joint pain or stiffness. Gilbert become very sleepy or not responsive. Gilbert have a seizure. This information is not intended to replace advice given to Gilbert by your health care provider. Make sure Gilbert discuss any questions Gilbert have with your health care provider. Document Revised: 05/07/2022 Document Reviewed: 05/07/2022 Elsevier Patient Education  2024 [employer].

## 2023-03-02 ENCOUNTER — Encounter (INDEPENDENT_AMBULATORY_CARE_PROVIDER_SITE_OTHER): Payer: Self-pay

## 2023-03-15 ENCOUNTER — Other Ambulatory Visit: Payer: Self-pay | Admitting: Family Medicine

## 2023-03-15 DIAGNOSIS — R569 Unspecified convulsions: Secondary | ICD-10-CM

## 2023-03-24 ENCOUNTER — Other Ambulatory Visit: Payer: Self-pay

## 2023-03-28 ENCOUNTER — Ambulatory Visit: Payer: Medicare Other | Admitting: Family Medicine

## 2023-04-07 ENCOUNTER — Ambulatory Visit: Payer: Medicare Other | Admitting: Cardiology

## 2023-04-12 ENCOUNTER — Other Ambulatory Visit: Payer: Self-pay | Admitting: Family Medicine

## 2023-04-12 DIAGNOSIS — R569 Unspecified convulsions: Secondary | ICD-10-CM

## 2023-05-02 ENCOUNTER — Ambulatory Visit: Payer: Medicare Other | Admitting: Family Medicine

## 2023-05-05 ENCOUNTER — Telehealth: Payer: Self-pay

## 2023-05-05 ENCOUNTER — Encounter: Payer: Self-pay | Admitting: Family Medicine

## 2023-05-05 ENCOUNTER — Ambulatory Visit (INDEPENDENT_AMBULATORY_CARE_PROVIDER_SITE_OTHER): Payer: Medicare Other | Admitting: Family Medicine

## 2023-05-05 ENCOUNTER — Other Ambulatory Visit: Payer: Self-pay

## 2023-05-05 ENCOUNTER — Inpatient Hospital Stay (HOSPITAL_COMMUNITY)
Admission: EM | Admit: 2023-05-05 | Discharge: 2023-05-12 | DRG: 641 | Disposition: A | Discharge status: Home / Self Care | Payer: Medicare Other | Attending: Internal Medicine | Admitting: Internal Medicine

## 2023-05-05 VITALS — BP 120/68 | HR 80 | Temp 99.0°F | Wt 160.8 lb

## 2023-05-05 DIAGNOSIS — Z85528 Personal history of other malignant neoplasm of kidney: Secondary | ICD-10-CM | POA: Diagnosis not present

## 2023-05-05 DIAGNOSIS — Z8249 Family history of ischemic heart disease and other diseases of the circulatory system: Secondary | ICD-10-CM | POA: Diagnosis not present

## 2023-05-05 DIAGNOSIS — N1832 Chronic kidney disease, stage 3b: Secondary | ICD-10-CM | POA: Diagnosis not present

## 2023-05-05 DIAGNOSIS — I129 Hypertensive chronic kidney disease with stage 1 through stage 4 chronic kidney disease, or unspecified chronic kidney disease: Secondary | ICD-10-CM | POA: Diagnosis not present

## 2023-05-05 DIAGNOSIS — D631 Anemia in chronic kidney disease: Secondary | ICD-10-CM | POA: Diagnosis not present

## 2023-05-05 DIAGNOSIS — R0600 Dyspnea, unspecified: Secondary | ICD-10-CM

## 2023-05-05 DIAGNOSIS — L509 Urticaria, unspecified: Secondary | ICD-10-CM | POA: Diagnosis not present

## 2023-05-05 DIAGNOSIS — Z885 Allergy status to narcotic agent status: Secondary | ICD-10-CM

## 2023-05-05 DIAGNOSIS — R64 Cachexia: Secondary | ICD-10-CM | POA: Diagnosis not present

## 2023-05-05 DIAGNOSIS — Z905 Acquired absence of kidney: Secondary | ICD-10-CM

## 2023-05-05 DIAGNOSIS — I10 Essential (primary) hypertension: Secondary | ICD-10-CM | POA: Diagnosis present

## 2023-05-05 DIAGNOSIS — K5730 Diverticulosis of large intestine without perforation or abscess without bleeding: Secondary | ICD-10-CM | POA: Diagnosis not present

## 2023-05-05 DIAGNOSIS — R21 Rash and other nonspecific skin eruption: Secondary | ICD-10-CM

## 2023-05-05 DIAGNOSIS — R569 Unspecified convulsions: Secondary | ICD-10-CM | POA: Diagnosis not present

## 2023-05-05 DIAGNOSIS — E790 Hyperuricemia without signs of inflammatory arthritis and tophaceous disease: Secondary | ICD-10-CM

## 2023-05-05 DIAGNOSIS — E785 Hyperlipidemia, unspecified: Secondary | ICD-10-CM | POA: Diagnosis not present

## 2023-05-05 DIAGNOSIS — N1330 Unspecified hydronephrosis: Secondary | ICD-10-CM | POA: Diagnosis not present

## 2023-05-05 DIAGNOSIS — I959 Hypotension, unspecified: Secondary | ICD-10-CM | POA: Diagnosis present

## 2023-05-05 DIAGNOSIS — K219 Gastro-esophageal reflux disease without esophagitis: Secondary | ICD-10-CM | POA: Diagnosis present

## 2023-05-05 DIAGNOSIS — D649 Anemia, unspecified: Secondary | ICD-10-CM | POA: Diagnosis not present

## 2023-05-05 DIAGNOSIS — E440 Moderate protein-calorie malnutrition: Secondary | ICD-10-CM | POA: Diagnosis not present

## 2023-05-05 DIAGNOSIS — I255 Ischemic cardiomyopathy: Secondary | ICD-10-CM | POA: Diagnosis not present

## 2023-05-05 DIAGNOSIS — Z800 Family history of malignant neoplasm of digestive organs: Secondary | ICD-10-CM

## 2023-05-05 DIAGNOSIS — Z955 Presence of coronary angioplasty implant and graft: Secondary | ICD-10-CM | POA: Diagnosis not present

## 2023-05-05 DIAGNOSIS — M8458XA Pathological fracture in neoplastic disease, other specified site, initial encounter for fracture: Secondary | ICD-10-CM | POA: Diagnosis not present

## 2023-05-05 DIAGNOSIS — Z8674 Personal history of sudden cardiac arrest: Secondary | ICD-10-CM

## 2023-05-05 DIAGNOSIS — I700 Atherosclerosis of aorta: Secondary | ICD-10-CM | POA: Diagnosis not present

## 2023-05-05 DIAGNOSIS — G40909 Epilepsy, unspecified, not intractable, without status epilepticus: Secondary | ICD-10-CM | POA: Diagnosis present

## 2023-05-05 DIAGNOSIS — M545 Low back pain, unspecified: Secondary | ICD-10-CM | POA: Diagnosis not present

## 2023-05-05 DIAGNOSIS — E7800 Pure hypercholesterolemia, unspecified: Secondary | ICD-10-CM | POA: Diagnosis present

## 2023-05-05 DIAGNOSIS — N189 Chronic kidney disease, unspecified: Secondary | ICD-10-CM

## 2023-05-05 DIAGNOSIS — M542 Cervicalgia: Secondary | ICD-10-CM | POA: Diagnosis not present

## 2023-05-05 DIAGNOSIS — E875 Hyperkalemia: Secondary | ICD-10-CM | POA: Diagnosis not present

## 2023-05-05 DIAGNOSIS — E872 Acidosis, unspecified: Secondary | ICD-10-CM | POA: Diagnosis not present

## 2023-05-05 DIAGNOSIS — K6389 Other specified diseases of intestine: Secondary | ICD-10-CM | POA: Diagnosis not present

## 2023-05-05 DIAGNOSIS — I739 Peripheral vascular disease, unspecified: Secondary | ICD-10-CM | POA: Diagnosis present

## 2023-05-05 DIAGNOSIS — I2510 Atherosclerotic heart disease of native coronary artery without angina pectoris: Secondary | ICD-10-CM | POA: Diagnosis not present

## 2023-05-05 DIAGNOSIS — Z83438 Family history of other disorder of lipoprotein metabolism and other lipidemia: Secondary | ICD-10-CM

## 2023-05-05 DIAGNOSIS — L0292 Furuncle, unspecified: Secondary | ICD-10-CM | POA: Diagnosis present

## 2023-05-05 DIAGNOSIS — C851A Unspecified b-cell lymphoma, in remission: Secondary | ICD-10-CM | POA: Diagnosis present

## 2023-05-05 DIAGNOSIS — M79672 Pain in left foot: Secondary | ICD-10-CM

## 2023-05-05 DIAGNOSIS — Z5941 Food insecurity: Secondary | ICD-10-CM

## 2023-05-05 DIAGNOSIS — Z823 Family history of stroke: Secondary | ICD-10-CM

## 2023-05-05 DIAGNOSIS — I5042 Chronic combined systolic (congestive) and diastolic (congestive) heart failure: Secondary | ICD-10-CM | POA: Diagnosis present

## 2023-05-05 DIAGNOSIS — Z452 Encounter for adjustment and management of vascular access device: Secondary | ICD-10-CM | POA: Diagnosis not present

## 2023-05-05 DIAGNOSIS — J9811 Atelectasis: Secondary | ICD-10-CM | POA: Diagnosis not present

## 2023-05-05 DIAGNOSIS — I5022 Chronic systolic (congestive) heart failure: Secondary | ICD-10-CM | POA: Diagnosis present

## 2023-05-05 DIAGNOSIS — C8330 Diffuse large B-cell lymphoma, unspecified site: Secondary | ICD-10-CM | POA: Diagnosis not present

## 2023-05-05 DIAGNOSIS — Z79899 Other long term (current) drug therapy: Secondary | ICD-10-CM

## 2023-05-05 DIAGNOSIS — Z7902 Long term (current) use of antithrombotics/antiplatelets: Secondary | ICD-10-CM

## 2023-05-05 DIAGNOSIS — Y92009 Unspecified place in unspecified non-institutional (private) residence as the place of occurrence of the external cause: Secondary | ICD-10-CM

## 2023-05-05 DIAGNOSIS — I130 Hypertensive heart and chronic kidney disease with heart failure and stage 1 through stage 4 chronic kidney disease, or unspecified chronic kidney disease: Secondary | ICD-10-CM | POA: Diagnosis present

## 2023-05-05 DIAGNOSIS — C649 Malignant neoplasm of unspecified kidney, except renal pelvis: Secondary | ICD-10-CM | POA: Diagnosis present

## 2023-05-05 DIAGNOSIS — N179 Acute kidney failure, unspecified: Secondary | ICD-10-CM | POA: Diagnosis not present

## 2023-05-05 DIAGNOSIS — M549 Dorsalgia, unspecified: Secondary | ICD-10-CM | POA: Diagnosis not present

## 2023-05-05 DIAGNOSIS — R54 Age-related physical debility: Secondary | ICD-10-CM | POA: Diagnosis present

## 2023-05-05 DIAGNOSIS — I252 Old myocardial infarction: Secondary | ICD-10-CM

## 2023-05-05 DIAGNOSIS — Z825 Family history of asthma and other chronic lower respiratory diseases: Secondary | ICD-10-CM

## 2023-05-05 DIAGNOSIS — W19XXXA Unspecified fall, initial encounter: Secondary | ICD-10-CM

## 2023-05-05 DIAGNOSIS — L299 Pruritus, unspecified: Secondary | ICD-10-CM | POA: Diagnosis present

## 2023-05-05 DIAGNOSIS — R197 Diarrhea, unspecified: Secondary | ICD-10-CM | POA: Diagnosis not present

## 2023-05-05 LAB — CBC
HCT: 30.9 % — ABNORMAL LOW (ref 39.0–52.0)
Hemoglobin: 10 g/dL — ABNORMAL LOW (ref 13.0–17.0)
MCHC: 32.3 g/dL (ref 30.0–36.0)
MCV: 90.1 fL (ref 78.0–100.0)
Platelets: 241 10*3/uL (ref 150.0–400.0)
RBC: 3.43 Mil/uL — ABNORMAL LOW (ref 4.22–5.81)
RDW: 14.1 % (ref 11.5–15.5)
WBC: 12.6 10*3/uL — ABNORMAL HIGH (ref 4.0–10.5)

## 2023-05-05 LAB — CBC WITH DIFFERENTIAL/PLATELET
Abs Immature Granulocytes: 0.03 10*3/uL (ref 0.00–0.07)
Basophils Absolute: 0.1 10*3/uL (ref 0.0–0.1)
Basophils Relative: 1 %
Eosinophils Absolute: 1.6 10*3/uL — ABNORMAL HIGH (ref 0.0–0.5)
Eosinophils Relative: 19 %
HCT: 29.7 % — ABNORMAL LOW (ref 39.0–52.0)
Hemoglobin: 9.5 g/dL — ABNORMAL LOW (ref 13.0–17.0)
Immature Granulocytes: 0 %
Lymphocytes Relative: 14 %
Lymphs Abs: 1.2 10*3/uL (ref 0.7–4.0)
MCH: 29 pg (ref 26.0–34.0)
MCHC: 32 g/dL (ref 30.0–36.0)
MCV: 90.5 fL (ref 80.0–100.0)
Monocytes Absolute: 0.8 10*3/uL (ref 0.1–1.0)
Monocytes Relative: 9 %
Neutro Abs: 5.1 10*3/uL (ref 1.7–7.7)
Neutrophils Relative %: 57 %
Platelets: 216 10*3/uL (ref 150–400)
RBC: 3.28 MIL/uL — ABNORMAL LOW (ref 4.22–5.81)
RDW: 14 % (ref 11.5–15.5)
WBC: 8.8 10*3/uL (ref 4.0–10.5)
nRBC: 0 % (ref 0.0–0.2)

## 2023-05-05 LAB — COMPREHENSIVE METABOLIC PANEL
ALT: 13 U/L (ref 0–53)
ALT: 16 U/L (ref 0–44)
AST: 22 U/L (ref 0–37)
AST: 23 U/L (ref 15–41)
Albumin: 3.5 g/dL (ref 3.5–5.2)
Albumin: 2.8 g/dL — ABNORMAL LOW (ref 3.5–5.0)
Alkaline Phosphatase: 147 U/L — ABNORMAL HIGH (ref 39–117)
Alkaline Phosphatase: 135 U/L — ABNORMAL HIGH (ref 38–126)
Anion gap: 15 (ref 5–15)
BUN: 62 mg/dL — ABNORMAL HIGH (ref 6–23)
BUN: 66 mg/dL — ABNORMAL HIGH (ref 8–23)
CO2: 17 meq/L — ABNORMAL LOW (ref 19–32)
CO2: 13 mmol/L — ABNORMAL LOW (ref 22–32)
Calcium: 9.3 mg/dL (ref 8.4–10.5)
Calcium: 9.1 mg/dL (ref 8.9–10.3)
Chloride: 110 meq/L (ref 96–112)
Chloride: 110 mmol/L (ref 98–111)
Creatinine, Ser: 4.31 mg/dL — ABNORMAL HIGH (ref 0.40–1.50)
Creatinine, Ser: 4.43 mg/dL — ABNORMAL HIGH (ref 0.61–1.24)
GFR, Estimated: 13 mL/min — ABNORMAL LOW (ref >60)
GFR: 13.02 mL/min — CL (ref >60.00)
Glucose, Bld: 90 mg/dL (ref 70–99)
Glucose, Bld: 92 mg/dL (ref 70–99)
Potassium: 5.7 meq/L — ABNORMAL HIGH (ref 3.5–5.1)
Potassium: 5.8 mmol/L — ABNORMAL HIGH (ref 3.5–5.1)
Sodium: 135 meq/L (ref 135–145)
Sodium: 138 mmol/L (ref 135–145)
Total Bilirubin: 0.4 mg/dL (ref 0.2–1.2)
Total Bilirubin: 0.2 mg/dL — ABNORMAL LOW (ref 0.3–1.2)
Total Protein: 6.6 g/dL (ref 6.0–8.3)
Total Protein: 5.9 g/dL — ABNORMAL LOW (ref 6.5–8.1)

## 2023-05-05 LAB — LIPID PANEL
Cholesterol: 141 mg/dL (ref 0–200)
HDL: 49.5 mg/dL (ref >39.00)
LDL Cholesterol: 63 mg/dL (ref 0–99)
NonHDL: 91.76
Total CHOL/HDL Ratio: 3
Triglycerides: 144 mg/dL (ref 0.0–149.0)
VLDL: 28.8 mg/dL (ref 0.0–40.0)

## 2023-05-05 MED ORDER — HYDROXYZINE PAMOATE 25 MG PO CAPS: 25.0000 mg | ORAL_CAPSULE | Freq: Three times a day (TID) | ORAL | 0 refills | Status: DC | PRN

## 2023-05-05 MED ORDER — PREDNISONE 20 MG PO TABS
40.0000 mg | ORAL_TABLET | Freq: Every day | ORAL | 0 refills | Status: DC
Start: 2023-05-05 — End: 2023-05-12

## 2023-05-05 MED ORDER — PHENYTOIN SODIUM EXTENDED 100 MG PO CAPS: 300.0000 mg | ORAL_CAPSULE | Freq: Every day | ORAL | 0 refills | Status: DC

## 2023-05-05 MED ORDER — ROSUVASTATIN CALCIUM 40 MG PO TABS: 40.0000 mg | ORAL_TABLET | Freq: Every day | ORAL | 3 refills | Status: DC

## 2023-05-05 NOTE — ED Provider Notes (Signed)
[facility] AT [facility] Provider Note   CSN: [id number] Arrival date & time: 05/05/23  2138     History  Chief Complaint  Patient presents with   Abnormal Blood Tests    Gilbert [name] is a 72 y.o. male.  72 year old male with multiple past medical problems to include coronary artery disease and ischemic cardiomyopathy, non-Hodgkin's lymphoma, renal cell carcinoma status post nephrectomy 10 years ago, hypertension who presents to the ER today secondary to assumedly acutely worsened renal function.  Patient appears to have a baseline EFR in the 40-50 range.  Last time the labs were checked was over a year ago.  He had some pruritus over the last month month and a half and went to his doctor today they found his GFR to be significantly low with a creatinine of above 4.  Directed him to the ER.  Patient states that the itching seems to be in progressively worsened and was not improved with Benadryl did not really get much better with Atarax either and so they check labs.  He endorses 30 pounds of weight loss recently that was unintentional.  No real shortness of breath.  Urinating okay.  Compliant with medications.  No new medicines.  He does relay a history of having upper respiratory infection about a month or so ago that was never treated.  He does not really remember the details of it is daughters 1 that brought it up.  No hematuria.  No new back pain.        Home Medications Prior to Admission medications   Medication Sig Start Date End Date Taking? Authorizing Provider  acetaminophen (TYLENOL) 500 MG tablet Take 1,000 mg by mouth daily.   Yes [provider]  APPLE CIDER VINEGAR PO Take 480 mg by mouth daily.   Yes [provider]  carvedilol (COREG) 12.5 MG tablet Take 1 tablet (12.5 mg total) by mouth 2 (two) times daily with a meal. 10/06/22  Yes Yates Decamp, MD  clopidogrel (PLAVIX) 75 MG tablet Take 1 tablet (75 mg total) by mouth  daily. 10/06/22  Yes Yates Decamp, MD  Cyanocobalamin (B-12) 2500 MCG TABS Take 2,500 mcg by mouth every morning.   Yes [provider]  ezetimibe (ZETIA) 10 MG tablet Take 1 tablet (10 mg total) by mouth daily. 10/06/22  Yes Yates Decamp, MD  famotidine (PEPCID) 20 MG tablet Take 20 mg by mouth 2 (two) times daily.   Yes [provider]  furosemide (LASIX) 40 MG tablet Take 1 tablet (40 mg total) by mouth daily as needed. For fluid build up or weight gain of 2 Lbs Patient taking differently: Take 40 mg by mouth daily as needed. Alternates between 20mg  and 10mg . Takes whole tablet then half then whole etc 10/06/22  Yes Yates Decamp, MD  isosorbide mononitrate (IMDUR) 60 MG 24 hr tablet Take 1 tablet (60 mg total) by mouth daily. 10/06/22 10/01/23 Yes Yates Decamp, MD  phenytoin (DILANTIN) 100 MG ER capsule Take 3 capsules (300 mg total) by mouth daily. 05/05/23  Yes Shade Flood, MD  potassium chloride (KLOR-CON M) 10 MEQ tablet Take 1 tablet (10 mEq total) by mouth daily as needed (With Furosemide). Take with Furosemide (Fluid Pill) 10/06/22  Yes Yates Decamp, MD  pyridOXINE (VITAMIN B-6) 100 MG tablet Take 100 mg by mouth every morning.    Yes [provider]  rosuvastatin (CRESTOR) 40 MG tablet Take 1 tablet (40 mg total) by mouth daily.  05/05/23 04/29/24 Yes Shade Flood, MD  sacubitril-valsartan (ENTRESTO) 49-51 MG Take 1 tablet by mouth 2 (two) times daily. 10/06/22  Yes Yates Decamp, MD  Thiamine HCl (VITAMIN B-1) 250 MG tablet Take 250 mg by mouth every morning.    Yes [provider]  hydrOXYzine (VISTARIL) 25 MG capsule Take 1 capsule (25 mg total) by mouth every 8 (eight) hours as needed. 05/05/23   Shade Flood, MD  predniSONE (DELTASONE) 20 MG tablet Take 2 tablets (40 mg total) by mouth daily with breakfast. 05/05/23   Shade Flood, MD      Allergies    Oxycodone    Review of Systems   Review of Systems  Physical Exam Updated Vital Signs BP 94/76    Pulse 82   Temp (!) 97.4 F (36.3 C) (Oral)   Resp (!) 27   SpO2 100%  Physical Exam Vitals and nursing note reviewed.  Constitutional:      Appearance: He is well-developed.  HENT:     Head: Normocephalic and atraumatic.  Eyes:     Pupils: Pupils are equal, round, and reactive to light.  Cardiovascular:     Rate and Rhythm: Normal rate.  Pulmonary:     Effort: Pulmonary effort is normal. No respiratory distress.  Abdominal:     General: There is no distension.  Musculoskeletal:        General: No swelling. Normal range of motion.     Cervical back: Normal range of motion.     Comments: R>L edema  Skin:    Comments: Diffuse excoriations without any obvious infections.  Neurological:     General: No focal deficit present.     Mental Status: He is alert.     ED Results / Procedures / Treatments   Labs (all labs ordered are listed, but only abnormal results are displayed) Labs Reviewed  CBC WITH DIFFERENTIAL/PLATELET - Abnormal; Notable for the following components:      Result Value   RBC 3.28 (*)    Hemoglobin 9.5 (*)    HCT 29.7 (*)    Eosinophils Absolute 1.6 (*)    All other components within normal limits  COMPREHENSIVE METABOLIC PANEL - Abnormal; Notable for the following components:   Potassium 5.8 (*)    CO2 13 (*)    BUN 66 (*)    Creatinine, Ser 4.43 (*)    Total Protein 5.9 (*)    Albumin 2.8 (*)    Alkaline Phosphatase 135 (*)    Total Bilirubin 0.2 (*)    GFR, Estimated 13 (*)    All other components within normal limits  LACTATE DEHYDROGENASE - Abnormal; Notable for the following components:   LDH 193 (*)    All other components within normal limits  BRAIN NATRIURETIC PEPTIDE - Abnormal; Notable for the following components:   B Natriuretic Peptide 112.5 (*)    All other components within normal limits  URIC ACID - Abnormal; Notable for the following components:   Uric Acid, Serum 11.1 (*)    All other components within normal limits   PHOSPHORUS  URINALYSIS, ROUTINE W REFLEX MICROSCOPIC  MICROALBUMIN / CREATININE URINE RATIO  PHENYTOIN LEVEL, FREE AND TOTAL  GLOMERULAR BASEMENT MEMBRANE ANTIBODIES  ANCA TITERS    EKG EKG Interpretation Date/Time:  Friday May 06 2023 00:31:41 EDT Ventricular Rate:  63 PR Interval:  154 QRS Duration:  110 QT Interval:  404 QTC Calculation: 414 R Axis:   57  Text Interpretation: Sinus  rhythm Ventricular premature complex Incomplete left bundle branch block Low voltage, precordial leads new TWI in multiple leads with mild ST changes as well compared to multiple in past, Desir be related to IVCD as his QRS is also slightly more prolonged. Confirmed by Marily Memos [phone]) on 05/06/2023 1:09:52 AM  Radiology CT Renal Stone Study  Result Date: 05/06/2023 CLINICAL DATA:  Abdominal and flank pain. Stone suspected. Prior history of right nephrectomy for carcinoma, with left hydronephrosis noted on renal ultrasound today. Additional history non-Hodgkin's lymphoma. EXAM: CT ABDOMEN AND PELVIS WITHOUT CONTRAST TECHNIQUE: Multidetector CT imaging of the abdomen and pelvis was performed following the standard protocol without IV contrast. RADIATION DOSE REDUCTION: This exam was performed according to the departmental dose-optimization program which includes automated exposure control, adjustment of the mA and/or kV according to patient size and/or use of iterative reconstruction technique. COMPARISON:  CT abdomen and pelvis without contrast 10/21/2015, PET-CT 07/29/2016. No intervening studies. FINDINGS: Lower chest: Mild diffuse bronchial thickening. There is a calcified granuloma in the left lower lobe. There are peripheral ground-glass opacities laterally in the right lower and middle lobes which could be post pneumonic scarring or active pneumonitis. No other focal infiltrate is seen. There is mild posterior atelectasis. The cardiac size is normal. There are three-vessel coronary calcifications.  There is no pericardial effusion. There is mild subareolar dendritic gynecomastia. Hepatobiliary: The liver, gallbladder and bile ducts are unremarkable without contrast. Pancreas: Partially atrophic, otherwise unremarkable without contrast. Spleen: No focal abnormality.  No splenomegaly. Adrenals/Urinary Tract: Old right nephroadrenalectomy. No mass is seen in the nephrectomy bed. Left kidney demonstrating a 2.2 cm homogeneous thin walled cyst in the outer midpole, Hounsfield density of 9.5. This was simple on ultrasound. No follow-up imaging is recommended. There is no other contour deforming abnormality. No adrenal mass. There is no urinary stone or obstruction. There was thought to be hydronephrosis on today's renal ultrasound but it is not seen on CT. 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 The bladder is unremarkable for the degree of distention. Stomach/Bowel: No dilatation or wall thickening including of the appendix. 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 There is sigmoid diverticulosis without evidence of acute diverticulitis. Vascular/Lymphatic: There is moderate to heavy aortoiliac calcific plaque, with prior stenting in the left common iliac and external iliac arteries. No adenopathy is seen. Reproductive: Prostatomegaly.  The prostate is 4.7 cm transverse. Other: No abdominal wall hernia or abnormality. No abdominopelvic ascites. Musculoskeletal: There is new mild anterior wedging of the T11 vertebral body with asymmetric sclerosis in the left 2/3 of the vertebral body. Possible this could be due to a sclerotic lesion with pathologic fracture. Bone scintigraphy is recommended. Age of findings is indeterminate but none of this was seen in 2017. There are grossly stable sclerotic lesions measuring 1 cm and 5 mm anteriorly in  the L4 vertebral body, most likely due to bone islands. Small scattered sclerotic foci in the sacrum are unchanged. There is osteopenia and degenerative change of the spine. IMPRESSION: 1. No urinary stone or obstruction. There was thought to be hydronephrosis on today's renal ultrasound but it is not seen on CT. 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 2. Peripheral ground-glass opacities in the right lower and middle lobes which could be post pneumonic scarring or active pneumonitis. 3. Bronchitis. 4. 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 No bowel obstruction or inflammation. 5. Sigmoid diverticulosis. 6. Prostatomegaly. 7. New mild anterior wedging of the T11 vertebral body with asymmetric sclerosis in the left 2/3 of the vertebral body. Possible this could be due to a sclerotic lesion with pathologic fracture. Bone scintigraphy or MRI recommended. 8. Aortic and coronary artery atherosclerosis. Aortic Atherosclerosis (ICD10-I70.0). Electronically Signed   By: Almira Bar M.D.   On: 05/06/2023 03:26   US RENAL  Result Date: 05/06/2023 CLINICAL DATA:  Acute kidney injury EXAM: RENAL / URINARY TRACT ULTRASOUND COMPLETE COMPARISON:  PET CT 07/29/2016 FINDINGS: Right Kidney: Renal measurements: Prior right nephrectomy = volume:  mL. Left Kidney: Renal measurements: 11 x 5.8 x 4.7 cm = volume: 157 mL. 2.3 cm upper pole cyst appears simple. No follow-up imaging recommended. Moderate hydronephrosis. Mild cortical thinning. Normal echotexture. Bladder: Appears normal for degree of bladder distention. Other: None. IMPRESSION: Prior right nephrectomy. Moderate left hydronephrosis. Electronically Signed   By: Charlett Nose M.D.   On: 05/06/2023 01:18   DG Chest 2 View  Result Date: 05/06/2023 CLINICAL DATA:  Abnormal labs.  Back pain and itching.  EXAM: CHEST - 2 VIEW COMPARISON:  06/01/2017 FINDINGS: Shallow inspiration. Heart size and pulmonary vascularity are normal for technique. Power port type central venous catheter with tip over the low SVC region. No pneumothorax. Suggestion of mild atelectasis in the left lung base. No airspace disease or consolidation. No pleural effusions. No pneumothorax. Mediastinal contours appear intact. Calcification of the aorta. IMPRESSION: Shallow inspiration with mild atelectasis in the left base. No edema or consolidation. Electronically Signed   By: Burman Nieves M.D.   On: 05/06/2023 00:57    Procedures .Critical Care  Performed by: Marily Memos, MD Authorized by: Marily Memos, MD   Critical care provider statement:    Critical care time (minutes):  30   Critical care was necessary to treat or prevent imminent or life-threatening deterioration of the following conditions:  Renal failure and metabolic crisis   Critical care was time spent personally by me on the following activities:  Development of treatment plan with patient or surrogate, discussions with consultants, evaluation of patient's response to treatment, examination of patient, ordering and review of laboratory studies, ordering and review of radiographic studies, ordering and performing treatments and interventions, pulse oximetry, re-evaluation of patient's condition and review of old charts   Care discussed with: admitting provider       Medications Ordered in ED Medications  hydrOXYzine (ATARAX) tablet 25 mg (25 mg Oral Given 05/06/23 0052)  lactated ringers bolus 250 mL (0 mLs Intravenous Stopped 05/06/23 0204)  sodium zirconium cyclosilicate (LOKELMA) packet 5 g (5 g Oral Given 05/06/23 0143)  lactated ringers bolus 500 mL (500 mLs Intravenous New Bag/Given 05/06/23 0355)  lactated ringers bolus 250 mL (250 mLs Intravenous New Bag/Given 05/06/23 0358)    ED Course/ Medical Decision Making/ A&P                                  Medical Decision Making Amount and/or Complexity of Data Reviewed Labs: ordered. Radiology: ordered. ECG/medicine tests: ordered.  Risk Prescription drug management. Decision regarding hospitalization.   Ddx is quite large for this to include medication toxicity versus tumor lysis from lymphoma vs new RCC vs medications vs dehydration vs stone versus glomerular nephritis from the infection  he had recently.. Will initiate workup here.  Workup shows mildly elevated BNP and no pulmonary edema on his x-ray on my interpretation so I think it is unlikely to be cardiorenal. His uric acid was slightly elevated and his LDH was slightly elevated and his phosphorus was normal.  Potassium was slightly elevated but his calcium was normal.  I think it is unlikely to be tumor lysis since these are not significantly elevated at this time. Ultrasound showed possibly a mild hydronephrosis.  I did not really think he would have a stone since he was only has 1 kidney and had been urinating but got a stone study just to make sure is no other obstruction or partial obstruction and it showed that the abnormality on ultrasound was more consistent with lipoma rather than nephrosis.  There is no distal blockages.  No evidence of recurrent cancer there. Patient with soft pressures we will continue hydration has not made urine yet but also with his heart failure do not want to fluid overload him.  Discussed with nephrology they agree with workup so far and asked me to add on a ANCA and AGBM.  Treated hyperkalemia without EKG changes just with gentle fluids and Lokelma.  Avoided Lasix and insulin secondary to the worsening GFR. D/w Dr. Haroldine Laws for admission.    Final Clinical Impression(s) / ED Diagnoses Final diagnoses:  Acute renal failure, unspecified acute renal failure type (HCC)  Hyperkalemia  Elevated uric acid in blood  Pruritus    Rx / DC Orders ED Discharge Orders     None         Keith Felten, Barbara Cower,  MD 05/06/23 [phone]

## 2023-05-05 NOTE — Patient Instructions (Addendum)
I will write a higher dose of hydroxyzine for now to help with itching, as well as prednisone that should also help itching in the short-term.  We will work on getting you the information for dermatology so you can see them as soon as possible.  I will recheck you in 1 week. I am also referring you to oncology/hematology but will check some baseline labs including your anemia test today.  We can discuss these further at follow-up in 1 week. Depending on anemia test and chest x-ray can discuss shortness of breath further at follow-up visit but be seen in the emergency room if this worsens or any new or worsening symptoms as we discussed.  I have ordered x-rays of your back, neck and chest, please have those performed at the [facility] location below.  [facility] or xray: Walk in 8:30-4:30 during weekdays, no appointment needed [address]  [place], [place] [address]  Return to the clinic or go to the nearest emergency room if any of your symptoms worsen or new symptoms occur.

## 2023-05-05 NOTE — Telephone Encounter (Signed)
Noted. Plan per lab note.

## 2023-05-05 NOTE — Progress Notes (Signed)
Subjective:  Patient ID: Gilbert [name], male    DOB: 1951/05/17  Age: 72 y.o. MRN: [id number]  CC:  Chief Complaint  Patient presents with   Medical Management of Chronic Issues    Neck pain, back pain wen standing since before last visit(2-3 months). Medication for the itching was working but not helping an-more, Cronic need to up the dose or possible derm referral, states rash is worse now than before. Spots all over body, but is itching all over and all the time. Seems to spread when scratches  Needs referral to be seen at cancer center, has not been seen/scanned since 2020    HPI Gilbert [name] presents for  Management of multiple concerns above. Here with dtr Gilbert [name].   Remote hx of seizure d/o  - still taking dilantin.   Hyperlipidemia: Tolerating crestor 40mg  every day.  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  Treated in 2018.  Prior clear-cell carcinoma of the right kidney.  History of anemia secondary to chemotherapy.  Has not seen oncology recently for follow-up.  Previously patient of Dr. Myna Hidalgo.  Last appointment with [facility] noted from December 2020.  Received Xgeva injection at that time with 6-month follow-up recommended. Last port flush noted in [place] 2021. No recent oncology eval.  Living with dtr Gilbert [name] and 19yo grandson.  Neck and back pain below.   Lab Results  Component Value Date   WBC 7.3 03/10/2022   HGB 10.5 (L) 03/23/2022   HCT 31.0 (L) 03/23/2022   MCV 84 03/10/2022   PLT 193 03/10/2022       Neck pain, back pain Noted after standing, reports symptoms for  approximately 2 to 3 months.  No known injury or fall. Neck pain noted with looking downward,  not radiating to arms. weak all over. No focal weakness. Low back pain past few months. No preceding fall or injury. Tx - tylenol - 2 times per day - some relief.     Rash Discussed July 25.  Pruritic rash for 3 to 4 months at that time, thought to have started with an area on his leg, treated with Compound W then itchy [name] throughout body with small pustules that drained but primarily itching.  Some redness after itching areas.  He had discussed urticaria at his March visit with cardiology and hydroxyzine had been prescribed that time did not initially help with itching then reported not working.  No other family members with similar rash, there was a dog at home but no known fleas.  No oral or genital lesions and no new medications in the preceding 6 months.  Uses Dove soap for bathing, no fabric softener, using Tide detergent at last visit but no new changes.  Had been on Dilantin for many years, and again no new meds.  He had tried antibiotic ointment for a few months as well as cortisone cream over-the-counter.  Question of dry skin dermatitis versus contact dermatitis, multiple excoriated areas at that visit but no sign of infection.  Sparing of his mid back,  out of area of reach.  No mucosal or genital lesions.  He was referred to dermatology at his July visit, hydroxyzine was refilled, triamcinolone prescription for pruritic areas and dry skin care reviewed with hydrating lotion recommended.  Recommended switching to allergy free detergent.  Referrals noted, referral letter was created and sent to patient September 5.  Has not received contact from dermatology.  No relief with triamcinolone cream.  Tried hydroxyzine tid  - min relief with the itching, does feel sleepy during the day but due to trouble sleeping at night. Has walker at home.  Has taken 2 of the hydroxyzine, felt better and no new side  effects.   Fall at home: Foot slipped a few days ago, going into home. Slippery floor. Fell and left ankle turned in. Left ankle and foot sore. No other injuries, no head injury, did not hit head.   Shortness of breath: Notes with activity, not at rest. Here with dtr. Noticed past few months. Hx of CHF, followed by cardiology. Less walking with neck and back pain, less walking. No productive cough. No fever. No new swelling. History of anemia as above.     History Patient Active Problem List   Diagnosis Date Noted   Acute systolic heart failure (HCC)    Mixed hyperlipidemia 12/24/2019   Chronic systolic heart failure (HCC) 03/23/2019   CHF (congestive heart failure) (HCC) 06/01/2017   AKI (acute kidney injury) (HCC) 04/07/2017   Orthostatic hypotension 04/07/2017   Diffuse non-Hodgkin's lymphoma of bone (HCC) 01/08/2016   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/07/2016   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   Hematuria 10/29/2015   Dysphagia, pharyngoesophageal phase    Early satiety    Esophageal stricture    Gastritis and gastroduodenitis    PAD (peripheral artery disease) (HCC) 05/28/2014   Ischemic cardiomyopathy 04/03/2014   History of left heart catheterization (LHC) 02/26/2014   Arterial bleed, intraoperative 02/26/2014   Seizures (HCC) 11/27/2013   Essential hypertension 11/27/2013   Hypomagnesemia 11/27/2013   Coronary artery disease involving native coronary artery of native heart without angina pectoris 11/26/2013   Renal cancer (HCC) 10/19/2013   Coronary artery calcification 09/25/2013   Renal cyst 08/30/2013   Loss of weight 08/03/2013   Past Medical History:  Diagnosis Date   Abnormal liver enzymes    Chronic alkaline phosphatase elevation since 2014   Acid reflux    CAD (coronary artery disease)    Cancer (HCC) 08/2013   right kidney cancer   Cancer (HCC)    Coronary artery calcification seen on CAT scan    Coronary atherosclerosis of  native coronary artery    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 Used Impella Circulatory assist device. EF= 20-25%   History of epilepsy    at least 10 years ago. Grandmal seizures since childhood   History of myocardial infarction less than 8 weeks    Stent placed   History of nephrectomy 11/2013   For renal cell carcinoma   History of tobacco use    HTN (hypertension)    Hypercholesteremia    Hyperlipemia    Hypertension  PAD (peripheral artery disease) (HCC)    Seizures (HCC)    LAST SZ 15-20 YEARS AGO 03-30-2021   Shortness of breath    with activity   SOB (shortness of breath)    Systolic and diastolic CHF, chronic (HCC)    Resolved. EF 45% 04/10/2014   Therapeutic drug monitoring    Past Surgical History:  Procedure Laterality Date   BALLOON DILATION N/A 01/21/2015   Procedure: BALLOON DILATION;  Surgeon: Iva Boop, MD;  Location: [facility] ENDOSCOPY;  Service: Endoscopy;  Laterality: N/A;   CARDIAC CATHETERIZATION N/A 06/10/2015   Procedure: Left Heart Cath and Coronary Angiography;  Surgeon: Yates Decamp, MD;  Location: [facility] INVASIVE CV LAB;  Service: Cardiovascular;  Laterality: N/A;   CORONARY ANGIOPLASTY WITH STENT PLACEMENT  08/02/2013   Left main coronary artery stenting on emergent basis along with circulatory support with Impella device through left leg. With 4.0 x 18 mm Xience alpine stent   EAR CYST EXCISION Left 05/31/2018   Procedure: EXCISION LEFT POSTERIOR EAR TUMOR;  Surgeon: Newman Pies, MD;  Location: [facility] OR;  Service: ENT;  Laterality: Left;   ESOPHAGOGASTRODUODENOSCOPY     ESOPHAGOGASTRODUODENOSCOPY N/A  01/21/2015   Procedure: ESOPHAGOGASTRODUODENOSCOPY (EGD) with possible dilation.;  Surgeon: Iva Boop, MD;  Location: [facility] ENDOSCOPY;  Service: Endoscopy;  Laterality: N/A;   EYE SURGERY Left 2 weeks ago   torn retina   ILIAC ARTERY STENT Left 05/28/2014   dr Jacinto Halim   LEFT HEART CATHETERIZATION WITH CORONARY ANGIOGRAM N/A 11/27/2013   Procedure: LEFT HEART CATHETERIZATION WITH CORONARY ANGIOGRAM;  Surgeon: Pamella Pert, MD;  Location: [facility] CATH LAB;  Service: Cardiovascular;  Laterality: N/A;   LOWER EXTREMITY ANGIOGRAM N/A 02/26/2014   Procedure: LOWER EXTREMITY ANGIOGRAM;  Surgeon: Pamella Pert, MD;  Location: [facility] CATH LAB;  Service: Cardiovascular;  Laterality: N/A;   LOWER EXTREMITY ANGIOGRAM N/A 05/28/2014   Procedure: LOWER EXTREMITY ANGIOGRAM;  Surgeon: Pamella Pert, MD;  Location: [facility] CATH LAB;  Service: Cardiovascular;  Laterality: N/A;   NEPHRECTOMY  11/30/2013   PERCUTANEOUS CORONARY STENT INTERVENTION (PCI-S)  11/27/2013   Procedure: PERCUTANEOUS CORONARY STENT INTERVENTION (PCI-S);  Surgeon: Pamella Pert, MD;  Location: [facility] CATH LAB;  Service: Cardiovascular;;   RIGHT/LEFT HEART CATH AND CORONARY ANGIOGRAPHY N/A 03/23/2022   Procedure: RIGHT/LEFT HEART CATH AND CORONARY ANGIOGRAPHY;  Surgeon: Yates Decamp, MD;  Location: [facility] INVASIVE CV LAB;  Service: Cardiovascular;  Laterality: N/A;   ROBOT ASSISTED LAPAROSCOPIC NEPHRECTOMY Right 10/19/2013   Procedure: ROBOTIC ASSISTED LAPAROSCOPIC NEPHRECTOMY,  EXTENSIVE ADHESIOLYSIS;  Surgeon: Sebastian Ache, MD;  Location: [facility] ORS;  Service: Urology;  Laterality: Right;   SKIN FULL THICKNESS GRAFT Left 05/31/2018   Procedure: SKIN GRAFT FULL THICKNESS FROM LEFT ABDOMEN TO LEFT EAR;  Surgeon: Newman Pies, MD;  Location: [facility] OR;  Service: ENT;  Laterality: Left;   Allergies  Allergen Reactions   Oxycodone Swelling and Other (See Comments)    Tongue and lips swell   Prior to Admission medications   Medication Sig Start Date End  Date Taking? Authorizing Provider  acetaminophen (TYLENOL) 500 MG tablet Take 1,000 mg by mouth daily.    [provider]  APPLE CIDER VINEGAR PO Take 480 mg by mouth daily.    [provider]  carvedilol (COREG) 12.5 MG tablet Take 1 tablet (12.5 mg total) by mouth 2 (two) times daily with a meal. 10/06/22   Yates Decamp, MD  clopidogrel (PLAVIX) 75 MG tablet Take 1 tablet (75 mg total)  by mouth daily. 10/06/22   Yates Decamp, MD  Cyanocobalamin (B-12) 2500 MCG TABS Take 2,500 mcg by mouth every morning.    [provider]  ezetimibe (ZETIA) 10 MG tablet Take 1 tablet (10 mg total) by mouth daily. 10/06/22   Yates Decamp, MD  famotidine (PEPCID) 20 MG tablet Take 20 mg by mouth 2 (two) times daily.    [provider]  furosemide (LASIX) 40 MG tablet Take 1 tablet (40 mg total) by mouth daily as needed. For fluid build up or weight gain of 2 Lbs 10/06/22   Yates Decamp, MD  hydrOXYzine (ATARAX) 10 MG tablet TAKE 1 TABLET BY MOUTH THREE TIMES DAILY AS NEEDED FOR ITCHING 02/24/23   Shade Flood, MD  isosorbide mononitrate (IMDUR) 60 MG 24 hr tablet Take 1 tablet (60 mg total) by mouth daily. 10/06/22 10/01/23  Yates Decamp, MD  phenytoin (DILANTIN) 100 MG ER capsule TAKE 3 CAPSULES BY MOUTH ONCE DAILY 08/03/22   Shade Flood, MD  phenytoin (DILANTIN) 100 MG ER capsule TAKE 3 CAPSULES BY MOUTH ONCE DAILY 04/12/23   Shade Flood, MD  potassium chloride (KLOR-CON M) 10 MEQ tablet Take 1 tablet (10 mEq total) by mouth daily as needed (With Furosemide). Take with Furosemide (Fluid Pill) 10/06/22   Yates Decamp, MD  pyridOXINE (VITAMIN B-6) 100 MG tablet Take 100 mg by mouth every morning.     [provider]  rosuvastatin (CRESTOR) 40 MG tablet Take 1 tablet (40 mg total) by mouth daily. 10/06/22 10/01/23  Yates Decamp, MD  sacubitril-valsartan (ENTRESTO) 49-51 MG Take 1 tablet by mouth 2 (two) times daily. 10/06/22   Yates Decamp, MD  Thiamine HCl (VITAMIN B-1) 250 MG tablet Take 250  mg by mouth every morning.     [provider]  triamcinolone cream (KENALOG) 0.1 % Apply 1 Application topically 2 (two) times daily. 02/24/23   Shade Flood, MD   Social History   Socioeconomic History   Marital status: Divorced    Spouse name: Not on file   Number of children: 2   Years of education: Not on file   Highest education level: Not on file  Occupational History   Occupation: retired  Tobacco Use   Smoking status: Former    Current packs/day: 0.00    Average packs/day: 2.0 packs/day for 45.0 years (90.0 ttl pk-yrs)    Types: Cigarettes    Start date: 08/02/1958    Quit date: 08/03/2003    Years since quitting: 19.7   Smokeless tobacco: Current    Types: Chew   Tobacco comments:    quit  smoking 10 years agop  Vaping Use   Vaping status: Never Used  Substance and Sexual Activity   Alcohol use: No    Alcohol/week: 0.0 standard drinks of alcohol    Comment: quit 1980   Drug use: No    Comment: none in past 30 years    Sexual activity: Not Currently  Other Topics Concern   Not on file  Social History Narrative   ** Merged History Encounter **       Social Determinants of Health   Financial Resource Strain: Low Risk  (03/11/2022)   Overall Financial Resource Strain (CARDIA)    Difficulty of Paying Living Expenses: Not hard at all  Food Insecurity: No Food Insecurity (03/11/2022)   Hunger Vital Sign    Worried About Running Out of Food in the Last Year: Never true    Ran  Out of Food in the Last Year: Never true  Transportation Needs: No Transportation Needs (03/11/2022)   PRAPARE - Administrator, Civil Service (Medical): No    Lack of Transportation (Non-Medical): No  Physical Activity: Insufficiently Active (03/11/2022)   Exercise Vital Sign    Days of Exercise per Week: 3 days    Minutes of Exercise per Session: 30 min  Stress: No Stress Concern Present (03/11/2022)   [employer] of Occupational Health - Occupational Stress  Questionnaire    Feeling of Stress : Not at all  Social Connections: Socially Isolated (03/11/2022)   Social Connection and Isolation Panel [NHANES]    Frequency of Communication with Friends and Family: Three times a week    Frequency of Social Gatherings with Friends and Family: Three times a week    Attends Religious Services: Never    Active Member of Clubs or Organizations: No    Attends Banker Meetings: Never    Marital Status: Divorced  Catering manager Violence: Not At Risk (03/11/2022)   Humiliation, Afraid, Rape, and Kick questionnaire    Fear of Current or Ex-Partner: No    Emotionally Abused: No    Physically Abused: No    Sexually Abused: No    Review of Systems   Objective:   Vitals:   05/05/23 1000  BP: 120/68  Pulse: 80  Temp: 99 F (37.2 C)  TempSrc: Temporal  SpO2: 93%  Weight: 160 lb 12.8 oz (72.9 kg)     Physical Exam Vitals reviewed.  Constitutional:      Appearance: He is well-developed.  HENT:     Head: Normocephalic and atraumatic.  Neck:     Vascular: No carotid bruit or JVD.  Cardiovascular:     Rate and Rhythm: Normal rate and regular rhythm.     Heart sounds: No murmur heard.    Comments: Distant, but regular rhythm.  Pulmonary:     Effort: Pulmonary effort is normal. No respiratory distress.     Breath sounds: Normal breath sounds. No stridor. No wheezing, rhonchi or rales.  Musculoskeletal:     Right lower leg: No edema.     Left lower leg: No edema.     Comments: Left ankle, no malleoli or tenderness.  Skin intact, no effusion or ecchymosis.  Achilles nontender.  Calcaneus nontender.  Left foot, skin intact without apparent, ecchymosis or swelling but he does have some tenderness over the navicula and dorsal midfoot.  No crepitance.  Remainder of foot including fifth metatarsal nontender.  C-spine, discomfort with extension, but no focal midline bony tenderness.  Lumbar spine, diffusely tender midline lumbar spine.    Ambulating with a cane.  Skin:    General: Skin is warm and dry.     Findings: Rash present.     Comments: Diffuse excoriated rash, see photos.  Spares genitalia and no mucosal lesions of mouth.  Neurological:     Mental Status: He is alert and oriented to person, place, and time.  Psychiatric:        Mood and Affect: Mood normal.           Over 90 minutes spent during visit, including chart review, counseling and assimilation of information, review of multiple concerns as above, exam, discussion of plan, and chart completion.     Assessment & Plan:  Yonathan Sleep is a 72 y.o. male . Fall in home, initial encounter Acute left ankle pain  -Left dorsal foot pain, denies other  injuries.  X-ray ordered.  Pure hypercholesterolemia - Plan: rosuvastatin (CRESTOR) 40 MG tablet, Comprehensive metabolic panel, Lipid panel Hyperlipidemia, unspecified hyperlipidemia type  -Tolerating Crestor, check labs, adjust plan accordingly  Rash and nonspecific skin eruption - Plan: hydrOXYzine (VISTARIL) 25 MG capsule, predniSONE (DELTASONE) 20 MG tablet Urticaria - Plan: hydrOXYzine (VISTARIL) 25 MG capsule  -Persistent urticarial rash, unfortunately dermatology eval did not occur, did not receive or remember receiving contact information from dermatology, will provide that info to schedule appointment.  Did try higher dose hydroxyzine, improved symptoms at 20 mg on his own, will prescribe 25 mg with potential side effects and risks including fall risk discussed.  Understanding expressed.  Diffuse large cell non-Hodgkin's lymphoma (HCC) - Plan: DG Chest 2 View, DG Lumbar Spine Complete, DG Cervical Spine Complete  -Unfortunately has not had recent follow-up with hematology.  Updated CBC, other labs ordered and will refer to hematology/oncology for follow-up.  Neck pain, back pain as below with previous bony involvement.  Positive could be degenerative disease in nature, will check imaging initially  with plain films to evaluate for secondary causes with history of non-Hodgkin's.  Dyspnea, unspecified type - Plan: CBC, DG Chest 2 View  -History of CHF without appreciable fluid overload on exam.  Check chest x-ray, CBC to rule out worsening anemia as cause.  Close follow-up.  ER precautions.  Anemia, unspecified type - Plan: CBC  -Chronic anemia, overdue for follow-up labs.  Check CBC.  Seizures (HCC) - Plan: phenytoin (DILANTIN) 100 MG ER capsule  -On Dilantin for prevention, no recent seizures, continue same dose.  Neck pain - Plan: DG Cervical Spine Complete Low back pain, unspecified back pain laterality, unspecified chronicity, unspecified whether sciatica present - Plan: DG Lumbar Spine Complete  -As above, with history of non-Hodgkin's lymphoma and bony involvement will check C-spine and lumbar spine imaging with plain films initially, Lizardi need further studies.  Lumbar spine did have midline bony tenderness.  No bony tenderness of cervical spine, but imaging as above with follow-up to discuss further testing depending on initial imaging.   Meds ordered this encounter  Medications   phenytoin (DILANTIN) 100 MG ER capsule    Sig: Take 3 capsules (300 mg total) by mouth daily.    Dispense:  90 capsule    Refill:  0   rosuvastatin (CRESTOR) 40 MG tablet    Sig: Take 1 tablet (40 mg total) by mouth daily.    Dispense:  90 tablet    Refill:  3    Discontinue atorvastatin   hydrOXYzine (VISTARIL) 25 MG capsule    Sig: Take 1 capsule (25 mg total) by mouth every 8 (eight) hours as needed.    Dispense:  30 capsule    Refill:  0   predniSONE (DELTASONE) 20 MG tablet    Sig: Take 2 tablets (40 mg total) by mouth daily with breakfast.    Dispense:  10 tablet    Refill:  0   Patient Instructions  I will write a higher dose of hydroxyzine for now to help with itching, as well as prednisone that should also help itching in the short-term.  We will work on getting you the information  for dermatology so you can see them as soon as possible.  I will recheck you in 1 week. I am also referring you to oncology/hematology but will check some baseline labs including your anemia test today.  We can discuss these further at follow-up in 1 week. Depending on anemia  test and chest x-ray can discuss shortness of breath further at follow-up visit but be seen in the emergency room if this worsens or any new or worsening symptoms as we discussed.  I have ordered x-rays of your back, neck and chest, please have those performed at the [facility] location below.  [facility] or xray: Walk in 8:30-4:30 during weekdays, no appointment needed [address]  [place], [place] [address]  Return to the clinic or go to the nearest emergency room if any of your symptoms worsen or new symptoms occur.       Signed,   Meredith Staggers, MD [facility], [facility] 05/05/23 8:10 PM   8:10 PM Addendum, plan as above based on office visit today, however critical labs noted.  See lab result note.  Significantly elevated creatinine compared to previous baseline, and with solitary kidney, prior nephrectomy for renal cancer concerning given that reading.  Appears to be renal failure, unsure how acute that is but based on current GFR, hyperkalemia and acidosis with bicarb of 17, ER evaluation and likely admission discussed with patient, with likely nephrology eval.   Called patient back to check status and advised to be seen in the ER, he plans either go tonight or tomorrow morning.  MyChart message received from daughter and will reply with this information as well.

## 2023-05-05 NOTE — ED Triage Notes (Signed)
Patient received a call from his PCP advised him to go to ER due to abnormal blood tests results taken this [facility] at [facility] . He adds persistent skin itching and pain across lower back .

## 2023-05-05 NOTE — Telephone Encounter (Signed)
Lab tech Henderson Cloud called and stated that patient has a critical GFR of 13.02.

## 2023-05-06 ENCOUNTER — Emergency Department (HOSPITAL_COMMUNITY): Payer: Medicare Other

## 2023-05-06 ENCOUNTER — Encounter (HOSPITAL_COMMUNITY): Payer: Self-pay | Admitting: Family Medicine

## 2023-05-06 DIAGNOSIS — J90 Pleural effusion, not elsewhere classified: Secondary | ICD-10-CM | POA: Diagnosis not present

## 2023-05-06 DIAGNOSIS — N1330 Unspecified hydronephrosis: Secondary | ICD-10-CM | POA: Diagnosis not present

## 2023-05-06 DIAGNOSIS — J9811 Atelectasis: Secondary | ICD-10-CM | POA: Diagnosis not present

## 2023-05-06 DIAGNOSIS — M19072 Primary osteoarthritis, left ankle and foot: Secondary | ICD-10-CM | POA: Diagnosis not present

## 2023-05-06 DIAGNOSIS — N1832 Chronic kidney disease, stage 3b: Secondary | ICD-10-CM | POA: Diagnosis present

## 2023-05-06 DIAGNOSIS — M549 Dorsalgia, unspecified: Secondary | ICD-10-CM | POA: Diagnosis not present

## 2023-05-06 DIAGNOSIS — Z905 Acquired absence of kidney: Secondary | ICD-10-CM | POA: Diagnosis not present

## 2023-05-06 DIAGNOSIS — N189 Chronic kidney disease, unspecified: Secondary | ICD-10-CM | POA: Diagnosis not present

## 2023-05-06 DIAGNOSIS — K219 Gastro-esophageal reflux disease without esophagitis: Secondary | ICD-10-CM | POA: Diagnosis present

## 2023-05-06 DIAGNOSIS — I2510 Atherosclerotic heart disease of native coronary artery without angina pectoris: Secondary | ICD-10-CM | POA: Diagnosis present

## 2023-05-06 DIAGNOSIS — Z8674 Personal history of sudden cardiac arrest: Secondary | ICD-10-CM | POA: Diagnosis not present

## 2023-05-06 DIAGNOSIS — L2989 Other pruritus: Secondary | ICD-10-CM | POA: Diagnosis not present

## 2023-05-06 DIAGNOSIS — Z85528 Personal history of other malignant neoplasm of kidney: Secondary | ICD-10-CM | POA: Diagnosis not present

## 2023-05-06 DIAGNOSIS — E872 Acidosis, unspecified: Secondary | ICD-10-CM | POA: Diagnosis present

## 2023-05-06 DIAGNOSIS — K5730 Diverticulosis of large intestine without perforation or abscess without bleeding: Secondary | ICD-10-CM | POA: Diagnosis not present

## 2023-05-06 DIAGNOSIS — Z8249 Family history of ischemic heart disease and other diseases of the circulatory system: Secondary | ICD-10-CM | POA: Diagnosis not present

## 2023-05-06 DIAGNOSIS — I5042 Chronic combined systolic (congestive) and diastolic (congestive) heart failure: Secondary | ICD-10-CM | POA: Diagnosis present

## 2023-05-06 DIAGNOSIS — D631 Anemia in chronic kidney disease: Secondary | ICD-10-CM | POA: Diagnosis present

## 2023-05-06 DIAGNOSIS — M8458XA Pathological fracture in neoplastic disease, other specified site, initial encounter for fracture: Secondary | ICD-10-CM | POA: Diagnosis present

## 2023-05-06 DIAGNOSIS — E875 Hyperkalemia: Secondary | ICD-10-CM | POA: Diagnosis present

## 2023-05-06 DIAGNOSIS — M79672 Pain in left foot: Secondary | ICD-10-CM | POA: Diagnosis not present

## 2023-05-06 DIAGNOSIS — Z955 Presence of coronary angioplasty implant and graft: Secondary | ICD-10-CM | POA: Diagnosis not present

## 2023-05-06 DIAGNOSIS — I700 Atherosclerosis of aorta: Secondary | ICD-10-CM | POA: Diagnosis not present

## 2023-05-06 DIAGNOSIS — G40909 Epilepsy, unspecified, not intractable, without status epilepticus: Secondary | ICD-10-CM | POA: Diagnosis present

## 2023-05-06 DIAGNOSIS — Z79899 Other long term (current) drug therapy: Secondary | ICD-10-CM | POA: Diagnosis not present

## 2023-05-06 DIAGNOSIS — I255 Ischemic cardiomyopathy: Secondary | ICD-10-CM | POA: Diagnosis present

## 2023-05-06 DIAGNOSIS — E440 Moderate protein-calorie malnutrition: Secondary | ICD-10-CM | POA: Diagnosis present

## 2023-05-06 DIAGNOSIS — J439 Emphysema, unspecified: Secondary | ICD-10-CM | POA: Diagnosis not present

## 2023-05-06 DIAGNOSIS — I5020 Unspecified systolic (congestive) heart failure: Secondary | ICD-10-CM | POA: Diagnosis not present

## 2023-05-06 DIAGNOSIS — M7732 Calcaneal spur, left foot: Secondary | ICD-10-CM | POA: Diagnosis not present

## 2023-05-06 DIAGNOSIS — E7800 Pure hypercholesterolemia, unspecified: Secondary | ICD-10-CM | POA: Diagnosis present

## 2023-05-06 DIAGNOSIS — Z452 Encounter for adjustment and management of vascular access device: Secondary | ICD-10-CM | POA: Diagnosis not present

## 2023-05-06 DIAGNOSIS — R64 Cachexia: Secondary | ICD-10-CM | POA: Diagnosis present

## 2023-05-06 DIAGNOSIS — I130 Hypertensive heart and chronic kidney disease with heart failure and stage 1 through stage 4 chronic kidney disease, or unspecified chronic kidney disease: Secondary | ICD-10-CM | POA: Diagnosis present

## 2023-05-06 DIAGNOSIS — R918 Other nonspecific abnormal finding of lung field: Secondary | ICD-10-CM | POA: Diagnosis not present

## 2023-05-06 DIAGNOSIS — N179 Acute kidney failure, unspecified: Secondary | ICD-10-CM | POA: Diagnosis present

## 2023-05-06 DIAGNOSIS — I252 Old myocardial infarction: Secondary | ICD-10-CM | POA: Diagnosis not present

## 2023-05-06 DIAGNOSIS — I739 Peripheral vascular disease, unspecified: Secondary | ICD-10-CM | POA: Diagnosis present

## 2023-05-06 DIAGNOSIS — C851A Unspecified b-cell lymphoma, in remission: Secondary | ICD-10-CM | POA: Diagnosis present

## 2023-05-06 DIAGNOSIS — K6389 Other specified diseases of intestine: Secondary | ICD-10-CM | POA: Diagnosis not present

## 2023-05-06 LAB — URINALYSIS, ROUTINE W REFLEX MICROSCOPIC
Bilirubin Urine: NEGATIVE
Glucose, UA: NEGATIVE mg/dL
Hgb urine dipstick: NEGATIVE
Ketones, ur: NEGATIVE mg/dL
Leukocytes,Ua: NEGATIVE
Nitrite: NEGATIVE
Protein, ur: NEGATIVE mg/dL
Specific Gravity, Urine: 1.016 (ref 1.005–1.030)
pH: 5 (ref 5.0–8.0)

## 2023-05-06 LAB — BASIC METABOLIC PANEL
Anion gap: 10 (ref 5–15)
BUN: 62 mg/dL — ABNORMAL HIGH (ref 8–23)
CO2: 15 mmol/L — ABNORMAL LOW (ref 22–32)
Calcium: 8.5 mg/dL — ABNORMAL LOW (ref 8.9–10.3)
Chloride: 110 mmol/L (ref 98–111)
Creatinine, Ser: 4.15 mg/dL — ABNORMAL HIGH (ref 0.61–1.24)
GFR, Estimated: 14 mL/min — ABNORMAL LOW (ref >60)
Glucose, Bld: 78 mg/dL (ref 70–99)
Potassium: 4.9 mmol/L (ref 3.5–5.1)
Sodium: 135 mmol/L (ref 135–145)

## 2023-05-06 LAB — BRAIN NATRIURETIC PEPTIDE: B Natriuretic Peptide: 112.5 pg/mL — ABNORMAL HIGH (ref 0.0–100.0)

## 2023-05-06 LAB — URIC ACID: Uric Acid, Serum: 11.1 mg/dL — ABNORMAL HIGH (ref 3.7–8.6)

## 2023-05-06 LAB — PSA: Prostatic Specific Antigen: 0.73 ng/mL (ref 0.00–4.00)

## 2023-05-06 LAB — IRON AND TIBC
Iron: 80 ug/dL (ref 45–182)
Saturation Ratios: 47 % — ABNORMAL HIGH (ref 17.9–39.5)
TIBC: 171 ug/dL — ABNORMAL LOW (ref 250–450)
UIBC: 91 ug/dL

## 2023-05-06 LAB — PHOSPHORUS
Phosphorus: 4.3 mg/dL (ref 2.5–4.6)
Phosphorus: 4.1 mg/dL (ref 2.5–4.6)

## 2023-05-06 LAB — FERRITIN: Ferritin: 213 ng/mL (ref 24–336)

## 2023-05-06 LAB — LACTATE DEHYDROGENASE: LDH: 193 U/L — ABNORMAL HIGH (ref 98–192)

## 2023-05-06 MED ORDER — HYDROXYZINE HCL 25 MG PO TABS
25.0000 mg | ORAL_TABLET | Freq: Three times a day (TID) | ORAL | Status: DC | PRN
  Administered 2023-05-06 (×2): 25 mg via ORAL
  Filled 2023-05-06 (×2): qty 1

## 2023-05-06 MED ORDER — LACTATED RINGERS IV BOLUS
250.0000 mL | Freq: Once | INTRAVENOUS | Status: AC
  Administered 2023-05-06: 250 mL via INTRAVENOUS

## 2023-05-06 MED ORDER — EZETIMIBE 10 MG PO TABS
10.0000 mg | ORAL_TABLET | Freq: Every day | ORAL | Status: DC
  Administered 2023-05-06 – 2023-05-12 (×7): 10 mg via ORAL
  Filled 2023-05-06 (×7): qty 1

## 2023-05-06 MED ORDER — AQUAPHOR EX OINT: TOPICAL_OINTMENT | CUTANEOUS | Status: DC | PRN

## 2023-05-06 MED ORDER — SODIUM CHLORIDE 0.45 % IV SOLN
INTRAVENOUS | Status: DC
  Filled 2023-05-06: qty 75

## 2023-05-06 MED ORDER — HYDROXYZINE HCL 25 MG PO TABS
50.0000 mg | ORAL_TABLET | Freq: Three times a day (TID) | ORAL | Status: DC | PRN
  Administered 2023-05-07 – 2023-05-12 (×6): 50 mg via ORAL
  Filled 2023-05-06 (×7): qty 2

## 2023-05-06 MED ORDER — SODIUM BICARBONATE 8.4 % IV SOLN
50.0000 meq | Freq: Once | INTRAVENOUS | Status: AC
  Administered 2023-05-06: 50 meq via INTRAVENOUS
  Filled 2023-05-06: qty 50

## 2023-05-06 MED ORDER — SACUBITRIL-VALSARTAN 49-51 MG PO TABS: 1.0000 | ORAL_TABLET | Freq: Two times a day (BID) | ORAL | Status: DC

## 2023-05-06 MED ORDER — SODIUM CHLORIDE 0.9 % IV SOLN: INTRAVENOUS | Status: DC

## 2023-05-06 MED ORDER — ACETAMINOPHEN 650 MG RE SUPP: 650.0000 mg | Freq: Four times a day (QID) | RECTAL | Status: DC | PRN

## 2023-05-06 MED ORDER — SENNOSIDES-DOCUSATE SODIUM 8.6-50 MG PO TABS: 1.0000 | ORAL_TABLET | Freq: Every evening | ORAL | Status: DC | PRN

## 2023-05-06 MED ORDER — ROSUVASTATIN CALCIUM 20 MG PO TABS
40.0000 mg | ORAL_TABLET | Freq: Every day | ORAL | Status: DC
  Administered 2023-05-06 – 2023-05-12 (×7): 40 mg via ORAL
  Filled 2023-05-06 (×9): qty 2

## 2023-05-06 MED ORDER — SODIUM BICARBONATE 650 MG PO TABS
650.0000 mg | ORAL_TABLET | Freq: Three times a day (TID) | ORAL | Status: DC
  Administered 2023-05-06 – 2023-05-12 (×18): 650 mg via ORAL
  Filled 2023-05-06 (×18): qty 1

## 2023-05-06 MED ORDER — LACTULOSE 10 GM/15ML PO SOLN: 10.0000 g | Freq: Every day | ORAL | Status: DC | PRN

## 2023-05-06 MED ORDER — ACETAMINOPHEN 325 MG PO TABS: 650.0000 mg | ORAL_TABLET | Freq: Four times a day (QID) | ORAL | Status: DC | PRN

## 2023-05-06 MED ORDER — ISOSORBIDE MONONITRATE ER 60 MG PO TB24: 60.0000 mg | ORAL_TABLET | Freq: Every day | ORAL | Status: DC

## 2023-05-06 MED ORDER — SODIUM ZIRCONIUM CYCLOSILICATE 5 G PO PACK
5.0000 g | PACK | Freq: Once | ORAL | Status: AC
  Administered 2023-05-06: 5 g via ORAL

## 2023-05-06 MED ORDER — CLOPIDOGREL BISULFATE 75 MG PO TABS
75.0000 mg | ORAL_TABLET | Freq: Every day | ORAL | Status: DC
  Administered 2023-05-06 – 2023-05-12 (×7): 75 mg via ORAL
  Filled 2023-05-06 (×7): qty 1

## 2023-05-06 MED ORDER — CALCIUM CARBONATE ANTACID 500 MG PO CHEW
1.0000 | CHEWABLE_TABLET | Freq: Two times a day (BID) | ORAL | Status: AC
  Administered 2023-05-06 – 2023-05-08 (×4): 200 mg via ORAL
  Filled 2023-05-06 (×4): qty 1

## 2023-05-06 MED ORDER — DIPHENHYDRAMINE-ZINC ACETATE 2-0.1 % EX CREA
TOPICAL_CREAM | Freq: Two times a day (BID) | CUTANEOUS | Status: DC | PRN
  Filled 2023-05-06 (×3): qty 28

## 2023-05-06 MED ORDER — SODIUM ZIRCONIUM CYCLOSILICATE 10 G PO PACK: 10.0000 g | PACK | Freq: Once | ORAL | Status: DC

## 2023-05-06 MED ORDER — LACTATED RINGERS IV BOLUS
500.0000 mL | Freq: Once | INTRAVENOUS | Status: AC
  Administered 2023-05-06: 500 mL via INTRAVENOUS

## 2023-05-06 MED ORDER — PHENYTOIN SODIUM EXTENDED 100 MG PO CAPS
300.0000 mg | ORAL_CAPSULE | Freq: Every day | ORAL | Status: DC
  Administered 2023-05-06 – 2023-05-12 (×7): 300 mg via ORAL
  Filled 2023-05-06 (×7): qty 3

## 2023-05-06 MED ORDER — CARVEDILOL 12.5 MG PO TABS
12.5000 mg | ORAL_TABLET | Freq: Two times a day (BID) | ORAL | Status: DC
  Filled 2023-05-06: qty 1

## 2023-05-06 MED ORDER — HYDROCERIN EX CREA
TOPICAL_CREAM | Freq: Two times a day (BID) | CUTANEOUS | Status: DC
  Filled 2023-05-06: qty 113

## 2023-05-06 MED ORDER — HEPARIN SODIUM (PORCINE) 5000 UNIT/ML IJ SOLN
5000.0000 [IU] | Freq: Three times a day (TID) | INTRAMUSCULAR | Status: DC
  Administered 2023-05-06 – 2023-05-12 (×18): 5000 [IU] via SUBCUTANEOUS
  Filled 2023-05-06 (×18): qty 1

## 2023-05-06 MED ORDER — FAMOTIDINE 20 MG PO TABS
20.0000 mg | ORAL_TABLET | Freq: Every day | ORAL | Status: DC
  Administered 2023-05-06 – 2023-05-12 (×7): 20 mg via ORAL
  Filled 2023-05-06 (×7): qty 1

## 2023-05-06 MED ORDER — SODIUM BICARBONATE 650 MG PO TABS
650.0000 mg | ORAL_TABLET | Freq: Three times a day (TID) | ORAL | Status: DC
  Administered 2023-05-06: 650 mg via ORAL
  Filled 2023-05-06: qty 1

## 2023-05-06 MED ORDER — CARVEDILOL 6.25 MG PO TABS
6.2500 mg | ORAL_TABLET | Freq: Two times a day (BID) | ORAL | Status: DC
  Administered 2023-05-07: 6.25 mg via ORAL
  Filled 2023-05-06: qty 1

## 2023-05-06 MED ORDER — SODIUM ZIRCONIUM CYCLOSILICATE 5 G PO PACK
10.0000 g | PACK | Freq: Once | ORAL | Status: DC
  Filled 2023-05-06: qty 2

## 2023-05-06 NOTE — ED Notes (Signed)
Pt calm in room with continuous monitoring in place. VSS. Aware urine sample ordered. Call bell within reach. Denies needs at this time.

## 2023-05-06 NOTE — Progress Notes (Signed)
  INTERVAL PROGRESS NOTE    Gilbert [name]- 72 y.o. male  LOS: 0 _________________________________________________  SUBJECTIVE: Admitted 05/05/2023 with cc of  Chief Complaint  Patient presents with   Abnormal Blood Tests   Since admission, labs improved. Holding home meds for hypotension  OBJECTIVE: Blood pressure (!) 94/50, pulse 63, temperature (!) 97.5 F (36.4 C), temperature source Oral, resp. rate 19, SpO2 100%.  ASSESSMENT/PLAN:  I have reviewed the full H&P by Dr. Joneen Roach, and additional pertinent documentation.  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  - BMP am  Hyperkalemia- K+ 5.8>4.9 s/p lokelma  Pruritus- topical benadryl and hydralazine PRN  Hypotension- holding home imdur, entresto  Unintentional weight loss-  Labs pending Granuloma on LLL seen on renal stone CT Cyst on left kidney simple, no recommended f/u imaging Prostatomegaly   Principal Problem:   Acute kidney injury superimposed on chronic kidney disease (HCC) Active Problems:   Renal cancer (HCC)   Seizures (HCC)   Essential hypertension   PAD (peripheral artery disease) (HCC)   Diffuse large cell non-Hodgkin's lymphoma (HCC)   Chronic systolic heart failure (HCC)   Hyperkalemia   Metabolic acidosis   Gilbert Bock, DO [facility] 05/06/2023, 7:21 AM    [email or web address] Available by Epic secure chat 7AM-7PM. If 7PM-7AM, please contact night-coverage   No Charge

## 2023-05-06 NOTE — H&P (Addendum)
PCP:   Shade Flood, MD   Chief Complaint:  Acute on chronic kidney injury  HPI: This is a 72 year old male with past medical history RCC [right nephrectomy], CKD stage IIIa, NHL in remission, cardiomyopathy, HTN,, PAD, seizures.  Per patient for approximate last month he has been itching and has gotten gradually worse.  He additionally developed these bumps all over.  He called his PCP, routine blood work revealed elevated creatinine.  Patient directed to go to the ER.  Patient denies NSAID use or recent antibiotic use.  Denies recent illness nausea, vomiting, fevers.  He states has been eating and drinking normally.  Denies any new, change in meds or over-the-counter medications.  He states he has been eating normally and his urine output has been normal.  He last urinated prior to leaving home.  He has not been able to urinate since being in the ER.  He additionally endorses a 30 pound weight loss over the last month.  His last colonoscopy was over the summer, he states he was told he did not have to have another.  Patient states she does not recall his having had a viral illness in the recent past.  He states he does not recall this.  He states he was not on antibiotics or medications for this.  In the ER patient is hemodynamically stable.  Creatinine 4.43, baseline 1.7 [12/21/2021], potassium 5.8 GFR 13 WBC 12.6.  CT abdomen and pelvis is without acute changes that provides an estimation.  There is a question of groundglass opacities in the right and middle lower lobes question pneumonic scarring or active pneumonitis.  CXR no acute disease.  UA pending [patient has not been able to urinate.  Patient given Lokelma and 500 cc LR bolus.  Nephrologist on-call Dr. Allena Katz contacted by EDP.  Review of Systems:  Per HPI.  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 Used Impella Circulatory assist device. EF= 20-25%   History of epilepsy    at least 10 years ago. 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 EF 45% 04/10/2014   Therapeutic drug monitoring    Past Surgical History:  Procedure Laterality Date   BALLOON DILATION N/A 01/21/2015   Procedure: BALLOON DILATION;  Surgeon: Iva Boop, MD;  Location: [facility] ENDOSCOPY;  Service: Endoscopy;  Laterality: N/A;   CARDIAC CATHETERIZATION N/A 06/10/2015   Procedure: Left Heart Cath and Coronary Angiography;  Surgeon: Yates Decamp, MD;  Location: [facility] INVASIVE CV LAB;  Service: Cardiovascular;  Laterality: N/A;   CORONARY ANGIOPLASTY WITH STENT PLACEMENT  08/02/2013   Left main coronary artery stenting on emergent basis along with circulatory support with Impella device through left leg. With 4.0 x 18 mm Xience alpine stent   EAR CYST EXCISION Left 05/31/2018   Procedure: EXCISION LEFT POSTERIOR EAR  TUMOR;  Surgeon: Newman Pies, MD;  Location: [facility] OR;  Service: ENT;  Laterality: Left;   ESOPHAGOGASTRODUODENOSCOPY     ESOPHAGOGASTRODUODENOSCOPY N/A 01/21/2015   Procedure: ESOPHAGOGASTRODUODENOSCOPY (EGD) with possible dilation.;  Surgeon: Iva Boop, MD;  Location: [facility] ENDOSCOPY;  Service: Endoscopy;  Laterality: N/A;   EYE SURGERY Left 2 weeks ago   torn retina   ILIAC ARTERY STENT Left 05/28/2014   dr Jacinto Halim   LEFT HEART CATHETERIZATION WITH CORONARY ANGIOGRAM N/A 11/27/2013   Procedure: LEFT HEART CATHETERIZATION WITH CORONARY ANGIOGRAM;  Surgeon: Pamella Pert, MD;  Location: [facility] CATH LAB;  Service: Cardiovascular;  Laterality: N/A;   LOWER EXTREMITY ANGIOGRAM N/A 02/26/2014   Procedure: LOWER EXTREMITY ANGIOGRAM;  Surgeon: Pamella Pert, MD;  Location: [facility] CATH LAB;  Service: Cardiovascular;  Laterality: N/A;   LOWER EXTREMITY ANGIOGRAM N/A 05/28/2014   Procedure: LOWER EXTREMITY ANGIOGRAM;  Surgeon: Pamella Pert, MD;  Location: [facility] CATH LAB;  Service: Cardiovascular;  Laterality: N/A;   NEPHRECTOMY  11/30/2013   PERCUTANEOUS CORONARY STENT INTERVENTION (PCI-S)  11/27/2013   Procedure: PERCUTANEOUS CORONARY STENT INTERVENTION (PCI-S);  Surgeon: Pamella Pert, MD;  Location: [facility] CATH LAB;  Service: Cardiovascular;;   RIGHT/LEFT HEART CATH AND CORONARY ANGIOGRAPHY N/A 03/23/2022   Procedure: RIGHT/LEFT HEART CATH AND CORONARY ANGIOGRAPHY;  Surgeon: Yates Decamp, MD;  Location: [facility] INVASIVE CV LAB;  Service: Cardiovascular;  Laterality: N/A;   ROBOT ASSISTED LAPAROSCOPIC NEPHRECTOMY Right 10/19/2013   Procedure: ROBOTIC ASSISTED LAPAROSCOPIC NEPHRECTOMY,  EXTENSIVE ADHESIOLYSIS;  Surgeon: Sebastian Ache, MD;  Location: [facility] ORS;  Service: Urology;  Laterality: Right;   SKIN FULL THICKNESS GRAFT Left 05/31/2018   Procedure: SKIN GRAFT FULL THICKNESS FROM LEFT ABDOMEN TO LEFT EAR;  Surgeon: Newman Pies, MD;  Location: [facility] OR;  Service: ENT;  Laterality: Left;    Medications: Prior to  Admission medications   Medication Sig Start Date End Date Taking? Authorizing Provider  acetaminophen (TYLENOL) 500 MG tablet Take 1,000 mg by mouth daily.   Yes [provider]  APPLE CIDER VINEGAR PO Take 480 mg by mouth daily.   Yes [provider]  carvedilol (COREG) 12.5 MG tablet Take 1 tablet (12.5 mg total) by mouth 2 (two) times daily with a meal. 10/06/22  Yes Yates Decamp, MD  clopidogrel (PLAVIX) 75 MG tablet Take 1 tablet (75 mg total) by mouth daily. 10/06/22  Yes Yates Decamp, MD  Cyanocobalamin (B-12) 2500 MCG TABS Take 2,500 mcg by mouth every morning.   Yes [provider]  ezetimibe (ZETIA) 10 MG tablet Take 1 tablet (10 mg total) by mouth daily. 10/06/22  Yes Yates Decamp, MD  famotidine (PEPCID) 20 MG tablet Take 20 mg by mouth 2 (two) times daily.   Yes [provider]  furosemide (LASIX) 40 MG tablet Take 1 tablet (40 mg total)  by mouth daily as needed. For fluid build up or weight gain of 2 Lbs Patient taking differently: Take 40 mg by mouth daily as needed. Alternates between 20mg  and 10mg . Takes whole tablet then half then whole etc 10/06/22  Yes Yates Decamp, MD  isosorbide mononitrate (IMDUR) 60 MG 24 hr tablet Take 1 tablet (60 mg total) by mouth daily. 10/06/22 10/01/23 Yes Yates Decamp, MD  phenytoin (DILANTIN) 100 MG ER capsule Take 3 capsules (300 mg total) by mouth daily. 05/05/23  Yes Shade Flood, MD  potassium chloride (KLOR-CON M) 10 MEQ tablet Take 1 tablet (10 mEq total) by mouth daily as needed (With Furosemide). Take with Furosemide (Fluid Pill) 10/06/22  Yes Yates Decamp, MD  pyridOXINE (VITAMIN B-6) 100 MG tablet Take 100 mg by mouth every morning.    Yes [provider]  rosuvastatin (CRESTOR) 40 MG tablet Take 1 tablet (40 mg total) by mouth daily. 05/05/23 04/29/24 Yes Shade Flood, MD  sacubitril-valsartan (ENTRESTO) 49-51 MG Take 1 tablet by mouth 2 (two) times daily. 10/06/22  Yes Yates Decamp, MD  Thiamine HCl (VITAMIN B-1)  250 MG tablet Take 250 mg by mouth every morning.    Yes [provider]  hydrOXYzine (VISTARIL) 25 MG capsule Take 1 capsule (25 mg total) by mouth every 8 (eight) hours as needed. 05/05/23   Shade Flood, MD  predniSONE (DELTASONE) 20 MG tablet Take 2 tablets (40 mg total) by mouth daily with breakfast. 05/05/23   Shade Flood, MD    Allergies:   Allergies  Allergen Reactions   Oxycodone Swelling and Other (See Comments)    Tongue and lips swell    Social History:  reports that he quit smoking about 19 years ago. His smoking use included cigarettes. He started smoking about 64 years ago. He has a 90 pack-year smoking history. His smokeless tobacco use includes chew. He reports that he does not drink alcohol and does not use drugs.  Family History: Family History  Problem Relation Age of Onset   Stroke Mother    Emphysema Father    Heart disease Brother        multiple stents   Hyperlipidemia Brother    Stomach cancer Maternal Grandmother    Colon cancer Neg Hx    Colon polyps Neg Hx    Esophageal cancer Neg Hx    Rectal cancer Neg Hx     Physical Exam: Vitals:   05/06/23 0115 05/06/23 0202 05/06/23 0215 05/06/23 0334  BP: (!) 86/67 (!) 92/53 135/83 94/76  Pulse: 61 64 64 82  Resp: 20 20 14  (!) 27  Temp:  (!) 97.4 F (36.3 C)    TempSrc:  Oral    SpO2: 100% 100% 97% 100%    General: A and O x 3, itchy male Eyes: Pink conjunctiva, no scleral icterus ENT: Moist oral mucosa, neck supple, no thyromegaly Lungs: CTA B/L, no wheeze, no crackles, no use of accessory muscles Cardiovascular: RRR, no regurgitation, no gallops, no murmurs. No carotid bruits, no JVD Abdomen: soft, positive BS, NTND, not an acute abdomen GU: not examined Neuro: CN II - XII grossly intact, sensation intact Musculoskeletal: strength 5/5 all extremities, no clubbing, cyanosis or edema Skin: Excoriation stretch marks on bilateral upper extremities Psych: appropriate  patient   Labs on Admission:  Recent Labs    05/05/23 1113 05/05/23 2326 05/06/23 0032  NA 135 138  --   K 5.7* 5.8*  --   CL 110  110  --   CO2 17* 13*  --   GLUCOSE 90 92  --   BUN 62* 66*  --   CREATININE 4.31* 4.43*  --   CALCIUM 9.3 9.1  --   PHOS  --   --  4.3   Recent Labs    05/05/23 1113 05/05/23 2326  AST 22 23  ALT 13 16  ALKPHOS 147* 135*  BILITOT 0.4 0.2*  PROT 6.6 5.9*  ALBUMIN 3.5 2.8*    Recent Labs    05/05/23 1113 05/05/23 2206  WBC 12.6* 8.8  NEUTROABS  --  5.1  HGB 10.0* 9.5*  HCT 30.9* 29.7*  MCV 90.1 90.5  PLT 241.0 216    Recent Labs    05/05/23 1113  CHOL 141  HDL 49.50  LDLCALC 63  TRIG 144.0  CHOLHDL 3     Radiological Exams on Admission: CT Renal Stone Study  Result Date: 05/06/2023 CLINICAL DATA:  Abdominal and flank pain. Stone suspected. Prior history of right nephrectomy for carcinoma, with left hydronephrosis noted on renal ultrasound today. Additional history non-Hodgkin's lymphoma. EXAM: CT ABDOMEN AND PELVIS WITHOUT CONTRAST TECHNIQUE: Multidetector CT imaging of the abdomen and pelvis was performed following the standard protocol without IV contrast. RADIATION DOSE REDUCTION: This exam was performed according to the departmental dose-optimization program which includes automated exposure control, adjustment of the mA and/or kV according to patient size and/or use of iterative reconstruction technique. COMPARISON:  CT abdomen and pelvis without contrast 10/21/2015, PET-CT 07/29/2016. No intervening studies. FINDINGS: Lower chest: Mild diffuse bronchial thickening. There is a calcified granuloma in the left lower lobe. There are peripheral ground-glass opacities laterally in the right lower and middle lobes which could be post pneumonic scarring or active pneumonitis. No other focal infiltrate is seen. There is mild posterior atelectasis. The cardiac size is normal. There are three-vessel coronary calcifications. There is no  pericardial effusion. There is mild subareolar dendritic gynecomastia. Hepatobiliary: The liver, gallbladder and bile ducts are unremarkable without contrast. Pancreas: Partially atrophic, otherwise unremarkable without contrast. Spleen: No focal abnormality.  No splenomegaly. Adrenals/Urinary Tract: Old right nephroadrenalectomy. No mass is seen in the nephrectomy bed. Left kidney demonstrating a 2.2 cm homogeneous thin walled cyst in the outer midpole, Hounsfield density of 9.5. This was simple on ultrasound. No follow-up imaging is recommended. There is no other contour deforming abnormality. No adrenal mass. There is no urinary stone or obstruction. There was thought to be hydronephrosis on today's renal ultrasound but it is not seen on CT. There is moderate renal sinus lipomatosis which [place] have mimicked the sonographic appearance of hydronephrosis. The bladder is unremarkable for the degree of distention. Stomach/Bowel: No dilatation or wall thickening including of the appendix. 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 There is sigmoid diverticulosis without evidence of acute diverticulitis. Vascular/Lymphatic: There is moderate to heavy aortoiliac calcific plaque, with prior stenting in the left common iliac and external iliac arteries. No adenopathy is seen. Reproductive: Prostatomegaly.  The prostate is 4.7 cm transverse. Other: No abdominal wall hernia or abnormality. No abdominopelvic ascites. Musculoskeletal: There is new mild anterior wedging of the T11 vertebral body with asymmetric sclerosis in the left 2/3 of the vertebral body. Possible this could be due to a sclerotic lesion with pathologic fracture. Bone scintigraphy is recommended. Age of findings is indeterminate but none of this was seen in 2017. There are grossly stable sclerotic  lesions measuring 1 cm and 5 mm anteriorly in the L4  vertebral body, most likely due to bone islands. Small scattered sclerotic foci in the sacrum are unchanged. There is osteopenia and degenerative change of the spine. IMPRESSION: 1. No urinary stone or obstruction. There was thought to be hydronephrosis on today's renal ultrasound but it is not seen on CT. There is moderate renal sinus lipomatosis which [place] have mimicked the sonographic appearance of hydronephrosis. 2. Peripheral ground-glass opacities in the right lower and middle lobes which could be post pneumonic scarring or active pneumonitis. 3. Bronchitis. 4. 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 No bowel obstruction or inflammation. 5. Sigmoid diverticulosis. 6. Prostatomegaly. 7. New mild anterior wedging of the T11 vertebral body with asymmetric sclerosis in the left 2/3 of the vertebral body. Possible this could be due to a sclerotic lesion with pathologic fracture. Bone scintigraphy or MRI recommended. 8. Aortic and coronary artery atherosclerosis. Aortic Atherosclerosis (ICD10-I70.0). Electronically Signed   By: Almira Bar M.D.   On: 05/06/2023 03:26   US RENAL  Result Date: 05/06/2023 CLINICAL DATA:  Acute kidney injury EXAM: RENAL / URINARY TRACT ULTRASOUND COMPLETE COMPARISON:  PET CT 07/29/2016 FINDINGS: Right Kidney: Renal measurements: Prior right nephrectomy = volume:  mL. Left Kidney: Renal measurements: 11 x 5.8 x 4.7 cm = volume: 157 mL. 2.3 cm upper pole cyst appears simple. No follow-up imaging recommended. Moderate hydronephrosis. Mild cortical thinning. Normal echotexture. Bladder: Appears normal for degree of bladder distention. Other: None. IMPRESSION: Prior right nephrectomy. Moderate left hydronephrosis. Electronically Signed   By: Charlett Nose M.D.   On: 05/06/2023 01:18   DG Chest 2 View  Result Date: 05/06/2023 CLINICAL DATA:  Abnormal labs.  Back pain and itching. EXAM:  CHEST - 2 VIEW COMPARISON:  06/01/2017 FINDINGS: Shallow inspiration. Heart size and pulmonary vascularity are normal for technique. Power port type central venous catheter with tip over the low SVC region. No pneumothorax. Suggestion of mild atelectasis in the left lung base. No airspace disease or consolidation. No pleural effusions. No pneumothorax. Mediastinal contours appear intact. Calcification of the aorta. IMPRESSION: Shallow inspiration with mild atelectasis in the left base. No edema or consolidation. Electronically Signed   By: Burman Nieves M.D.   On: 05/06/2023 00:57    Assessment/Plan Present on Admission:  Acute on chronic kidney injury stage 3B -Unclear etiology for acute decompensation. -HCTZ, Entresto, Lasix held -Avoid nephrotoxic medication -Renal ultrasound suggestive of hydronephrosis//CT abdomen pelvis/renal ultrasound show acute issues or hydronephrosis -Nephrologist Dr. Allena Katz has been consulted by EDP.  They will see patient in a.m. -ANA and ANCA ordered per nephrology's recommendation -Strict I's and O's -Gentle IV fluid hydration -BUN 66,  -.45NS w/ bicarb @ 75cc/hr  Hyperkalemia/metabolic acidosis -Bicarb drip initiated. -Lokelma given.  K-Dur held. -Will reorder BMP at 10 AM -Add phosphorus level  Pruritus -Likely secondary to uremia.  Congestive treatment per nephrology if indicated -Continue Atarax as needed  Unexplained weight loss -CEA, PSA ordered -CT abdomen pelvis pending.   -Defer to a.m. team inpatient vs outpatient follow-up  Chronic systolic heart failure (HCC) -History of cardiomyopathy, most recent echo 2018, EF 15-20%.  Most recent LHC 2023, EF not noted. -PCV echo  11/18/2022 EF 40%, IV fluid hydration on board  CAD //PAD -Entresto/HCTZ on hold d/t declining renal insufficiency -Zetia, Plavix resumed -Imdur on hold with patient's borderline blood pressure   Essential hypertension -Borderline hypotension, most  recent BP  94/76  History of seizures -On Dilantin.  Phenytoin level ordered   Diffuse large cell non-Hodgkin's lymphoma (HCC)-in remission  Renal cancer (HCC), sp right nephrectomy  -Approximately 10 years prior  Patrice Matthew 05/06/2023, 4:11 AM

## 2023-05-06 NOTE — ED Notes (Addendum)
ED TO INPATIENT HANDOFF REPORT  ED Nurse Name and Phone #: Dahlia Client [phone]  S Name/Age/Gender Gilbert [name] 72 y.o. male Room/Bed: 040C/040C  Code Status   Code Status: Full Code  Home/SNF/Other Home Patient oriented to: self, place, time, and situation Is this baseline? Yes   Triage Complete: Triage complete  Chief Complaint Acute kidney injury superimposed on chronic kidney disease (HCC) [N17.9, N18.9]  Triage Note Patient received a call from his PCP advised him to go to ER due to abnormal blood tests results taken this morning at [facility] . He adds persistent skin itching and pain across lower back .    Allergies Allergies  Allergen Reactions   Oxycodone Swelling and Other (See Comments)    Tongue and lips swell    Level of Care/Admitting Diagnosis ED Disposition     ED Disposition  Admit   Condition  --   Comment  Hospital Area: [facility] [[id number]]  Level of Care: Telemetry Medical [104]  [place] admit patient to [facility] or [facility] if equivalent level of care is available:: No  Covid Evaluation: Asymptomatic - no recent exposure (last 10 days) testing not required  Diagnosis: Acute kidney injury superimposed on chronic kidney disease [facility]) [[id number]]  Admitting Physician: Gery Pray [[id number]]  Attending Physician: Gery Pray [[id number]]  Certification:: I certify this patient will need inpatient services for at least 2 midnights  Expected Medical Readiness: 05/08/2023          B Medical/Surgery History Past Medical History:  Diagnosis Date   Abnormal liver enzymes    Chronic alkaline phosphatase elevation since 2014   Acid reflux    CAD (coronary artery disease)    Cancer (HCC) 08/2013   right kidney cancer   Cancer (HCC)    Coronary artery calcification seen on CAT scan    Coronary atherosclerosis of native coronary artery    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 Used Impella Circulatory assist device. EF= 20-25%   History of epilepsy    at least 10 years ago. 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 EF 45% 04/10/2014   Therapeutic drug monitoring    Past Surgical History:  Procedure Laterality Date   BALLOON DILATION N/A 01/21/2015   Procedure: BALLOON DILATION;  Surgeon: Iva Boop, MD;  Location[facility] ENDOSCOPY;  Service: Endoscopy;  Laterality: N/A;   CARDIAC CATHETERIZATION N/A 06/10/2015   Procedure: Left Heart Cath and Coronary Angiography;  Surgeon: Yates Decamp, MD;  Location: [facility] INVASIVE CV LAB;  Service: Cardiovascular;  Laterality: N/A;  CORONARY ANGIOPLASTY WITH STENT PLACEMENT  08/02/2013   Left main coronary artery stenting on emergent basis along with circulatory support with Impella device through left leg. With 4.0 x 18 mm Xience alpine stent   EAR CYST EXCISION Left 05/31/2018   Procedure: EXCISION LEFT POSTERIOR EAR TUMOR;  Surgeon: Newman Pies, MD;  Location: [facility] OR;  Service: ENT;  Laterality: Left;   ESOPHAGOGASTRODUODENOSCOPY     ESOPHAGOGASTRODUODENOSCOPY N/A 01/21/2015   Procedure: ESOPHAGOGASTRODUODENOSCOPY (EGD) with possible dilation.;  Surgeon: Iva Boop, MD;  Location: [facility]  ENDOSCOPY;  Service: Endoscopy;  Laterality: N/A;   EYE SURGERY Left 2 weeks ago   torn retina   ILIAC ARTERY STENT Left 05/28/2014   dr Jacinto Halim   LEFT HEART CATHETERIZATION WITH CORONARY ANGIOGRAM N/A 11/27/2013   Procedure: LEFT HEART CATHETERIZATION WITH CORONARY ANGIOGRAM;  Surgeon: Pamella Pert, MD;  Location: [facility] CATH LAB;  Service: Cardiovascular;  Laterality: N/A;   LOWER EXTREMITY ANGIOGRAM N/A 02/26/2014   Procedure: LOWER EXTREMITY ANGIOGRAM;  Surgeon: Pamella Pert, MD;  Location: [facility] CATH LAB;  Service: Cardiovascular;  Laterality: N/A;   LOWER EXTREMITY ANGIOGRAM N/A 05/28/2014   Procedure: LOWER EXTREMITY ANGIOGRAM;  Surgeon: Pamella Pert, MD;  Location: [facility] CATH LAB;  Service: Cardiovascular;  Laterality: N/A;   NEPHRECTOMY  11/30/2013   PERCUTANEOUS CORONARY STENT INTERVENTION (PCI-S)  11/27/2013   Procedure: PERCUTANEOUS CORONARY STENT INTERVENTION (PCI-S);  Surgeon: Pamella Pert, MD;  Location: [facility] CATH LAB;  Service: Cardiovascular;;   RIGHT/LEFT HEART CATH AND CORONARY ANGIOGRAPHY N/A 03/23/2022   Procedure: RIGHT/LEFT HEART CATH AND CORONARY ANGIOGRAPHY;  Surgeon: Yates Decamp, MD;  Location: [facility] INVASIVE CV LAB;  Service: Cardiovascular;  Laterality: N/A;   ROBOT ASSISTED LAPAROSCOPIC NEPHRECTOMY Right 10/19/2013   Procedure: ROBOTIC ASSISTED LAPAROSCOPIC NEPHRECTOMY,  EXTENSIVE ADHESIOLYSIS;  Surgeon: Sebastian Ache, MD;  Location: [facility] ORS;  Service: Urology;  Laterality: Right;   SKIN FULL THICKNESS GRAFT Left 05/31/2018   Procedure: SKIN GRAFT FULL THICKNESS FROM LEFT ABDOMEN TO LEFT EAR;  Surgeon: Newman Pies, MD;  Location: [facility] OR;  Service: ENT;  Laterality: Left;     A IV Location/Drains/Wounds Patient Lines/Drains/Airways Status     Active Line/Drains/Airways     Name Placement date Placement time Site Days   Implanted Port 01/20/16 Right Chest 01/20/16  --  Chest  2663   Peripheral IV 05/05/23 20 G 1" Right Antecubital 05/05/23  2326  Antecubital  1    Incision (Closed) 05/31/18 Abdomen Left 05/31/18  0946  -- 1801   Incision (Closed) 05/31/18 Ear Left 05/31/18  0946  -- 1801            Intake/Output Last 24 hours  Intake/Output Summary (Last 24 hours) at 05/06/2023 1610 Last data filed at 05/06/2023 0730 Gross per 24 hour  Intake 1132.62 ml  Output --  Net 1132.62 ml    Labs/Imaging Results for orders placed or performed during the hospital encounter of 05/05/23 (from the past 48 hour(s))  CBC with Differential     Status: Abnormal   Collection Time: 05/05/23 10:06 PM  Result Value Ref Range   WBC 8.8 4.0 - 10.5 K/uL   RBC 3.28 (L) 4.22 - 5.81 MIL/uL   Hemoglobin 9.5 (L) 13.0 - 17.0 g/dL   HCT 96.0 (L) 45.4 - 09.8 %   MCV 90.5 80.0 - 100.0 fL   MCH 29.0 26.0 - 34.0 pg   MCHC 32.0 30.0 - 36.0 g/dL   RDW 11.9 14.7 - 82.9 %  Platelets 216 150 - 400 K/uL   nRBC 0.0 0.0 - 0.2 %   Neutrophils Relative % 57 %   Neutro Abs 5.1 1.7 - 7.7 K/uL   Lymphocytes Relative 14 %   Lymphs Abs 1.2 0.7 - 4.0 K/uL   Monocytes Relative 9 %   Monocytes Absolute 0.8 0.1 - 1.0 K/uL   Eosinophils Relative 19 %   Eosinophils Absolute 1.6 (H) 0.0 - 0.5 K/uL   Basophils Relative 1 %   Basophils Absolute 0.1 0.0 - 0.1 K/uL   Immature Granulocytes 0 %   Abs Immature Granulocytes 0.03 0.00 - 0.07 K/uL    Comment: Performed at [facility], [address], [place], [place] [address]  Comprehensive metabolic panel     Status: Abnormal   Collection Time: 05/05/23 11:26 PM  Result Value Ref Range   Sodium 138 135 - 145 mmol/L   Potassium 5.8 (H) 3.5 - 5.1 mmol/L   Chloride 110 98 - 111 mmol/L   CO2 13 (L) 22 - 32 mmol/L   Glucose, Bld 92 70 - 99 mg/dL    Comment: Glucose reference range applies only to samples taken after fasting for at least 8 hours.   BUN 66 (H) 8 - 23 mg/dL   Creatinine, Ser 6.04 (H) 0.61 - 1.24 mg/dL   Calcium 9.1 8.9 - 54.0 mg/dL   Total Protein 5.9 (L) 6.5 - 8.1 g/dL   Albumin 2.8 (L) 3.5 - 5.0 g/dL   AST 23  15 - 41 U/L   ALT 16 0 - 44 U/L   Alkaline Phosphatase 135 (H) 38 - 126 U/L   Total Bilirubin 0.2 (L) 0.3 - 1.2 mg/dL   GFR, Estimated 13 (L) >60 mL/min    Comment: (NOTE) Calculated using the CKD-EPI Creatinine Equation (2021)    Anion gap 15 5 - 15    Comment: Performed at [facility] Lab, [address], [place], [place] [address]  Phosphorus     Status: None   Collection Time: 05/06/23 12:32 AM  Result Value Ref Range   Phosphorus 4.3 2.5 - 4.6 mg/dL    Comment: Performed at [facility], [address], [place], [place] [address]  Lactate dehydrogenase     Status: Abnormal   Collection Time: 05/06/23 12:32 AM  Result Value Ref Range   LDH 193 (H) 98 - 192 U/L    Comment: Performed at [facility], [address] [place], [place] [address]  Brain natriuretic peptide     Status: Abnormal   Collection Time: 05/06/23 12:32 AM  Result Value Ref Range   B Natriuretic Peptide 112.5 (H) 0.0 - 100.0 pg/mL    Comment: Performed at [facility] Lab, [address] [place], [place] [address]  Uric acid     Status: Abnormal   Collection Time: 05/06/23 12:32 AM  Result Value Ref Range   Uric Acid, Serum 11.1 (H) 3.7 - 8.6 mg/dL    Comment: Performed at [facility], [address], [place], [place] [address]  Phosphorus     Status: None   Collection Time: 05/06/23  6:16 AM  Result Value Ref Range   Phosphorus 4.1 2.5 - 4.6 mg/dL    Comment: Performed at [facility], [address] [place], [place] [address]  PSA     Status: None   Collection Time: 05/06/23  6:16 AM  Result Value Ref Range   Prostatic Specific Antigen  0.73 0.00 - 4.00 ng/mL    Comment: (NOTE) While PSA levels of <=4.00 ng/ml are reported as reference range, some men with levels below 4.00 ng/ml can have prostate cancer and many men with PSA above 4.00 ng/ml do not have prostate cancer.  Other tests such as free PSA, age specific reference ranges, PSA velocity and PSA doubling time  Barro be helpful especially in men less than 43 years old. Performed at [facility] Lab, [address] [place], [place] [address]    CT Renal Stone Study  Result Date: 05/06/2023 CLINICAL DATA:  Abdominal and flank pain. Stone suspected. Prior history of right nephrectomy for carcinoma, with left hydronephrosis noted on renal ultrasound today. Additional history non-Hodgkin's lymphoma. EXAM: CT ABDOMEN AND PELVIS WITHOUT CONTRAST TECHNIQUE: Multidetector CT imaging of the abdomen and pelvis was performed following the standard protocol without IV contrast. RADIATION DOSE REDUCTION: This exam was performed according to the departmental dose-optimization program which includes automated exposure control, adjustment of the mA and/or kV according to patient size and/or use of iterative reconstruction technique. COMPARISON:  CT abdomen and pelvis without contrast 10/21/2015, PET-CT 07/29/2016. No intervening studies. FINDINGS: Lower chest: Mild diffuse bronchial thickening. There is a calcified granuloma in the left lower lobe. There are peripheral ground-glass opacities laterally in the right lower and middle lobes which could be post pneumonic scarring or active pneumonitis. No other focal infiltrate is seen. There is mild posterior atelectasis. The cardiac size is normal. There are three-vessel coronary calcifications. There is no pericardial effusion. There is mild subareolar dendritic gynecomastia. Hepatobiliary: The liver, gallbladder and bile ducts are unremarkable without contrast. Pancreas: Partially atrophic, otherwise unremarkable without contrast. Spleen: No focal abnormality.  No splenomegaly. Adrenals/Urinary Tract: Old right nephroadrenalectomy. No mass is seen in the nephrectomy bed. Left kidney demonstrating a 2.2 cm homogeneous thin walled cyst in the outer midpole, Hounsfield density of 9.5. This was simple on ultrasound. No follow-up imaging is recommended. There is no other contour  deforming abnormality. No adrenal mass. There is no urinary stone or obstruction. There was thought to be hydronephrosis on today's renal ultrasound but it is not seen on CT. There is moderate renal sinus lipomatosis which [place]well have mimicked the sonographic appearance of hydronephrosis. The bladder is unremarkable for the degree of distention. Stomach/Bowel: No dilatation or wall thickening including of the appendix. 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 There is sigmoid diverticulosis without evidence of acute diverticulitis. Vascular/Lymphatic: There is moderate to heavy aortoiliac calcific plaque, with prior stenting in the left common iliac and external iliac arteries. No adenopathy is seen. Reproductive: Prostatomegaly.  The prostate is 4.7 cm transverse. Other: No abdominal wall hernia or abnormality. No abdominopelvic ascites. Musculoskeletal: There is new mild anterior wedging of the T11 vertebral body with asymmetric sclerosis in the left 2/3 of the vertebral body. Possible this could be due to a sclerotic lesion with pathologic fracture. Bone scintigraphy is recommended. Age of findings is indeterminate but none of this was seen in 2017. There are grossly stable sclerotic lesions measuring 1 cm and 5 mm anteriorly in the L4 vertebral body, most likely due to bone islands. Small scattered sclerotic foci in the sacrum are unchanged. There is osteopenia and degenerative change of the spine. IMPRESSION: 1. No urinary stone or obstruction. There was thought to be hydronephrosis on today's renal ultrasound but it is not seen on CT. There is moderate  renal sinus lipomatosis which Yusupov have mimicked the sonographic appearance of hydronephrosis. 2. Peripheral ground-glass opacities in the right lower and middle lobes which could be post pneumonic scarring or active pneumonitis. 3. Bronchitis. 4.  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 No bowel obstruction or inflammation. 5. Sigmoid diverticulosis. 6. Prostatomegaly. 7. New mild anterior wedging of the T11 vertebral body with asymmetric sclerosis in the left 2/3 of the vertebral body. Possible this could be due to a sclerotic lesion with pathologic fracture. Bone scintigraphy or MRI recommended. 8. Aortic and coronary artery atherosclerosis. Aortic Atherosclerosis (ICD10-I70.0). Electronically Signed   By: Almira Bar M.D.   On: 05/06/2023 03:26   US RENAL  Result Date: 05/06/2023 CLINICAL DATA:  Acute kidney injury EXAM: RENAL / URINARY TRACT ULTRASOUND COMPLETE COMPARISON:  PET CT 07/29/2016 FINDINGS: Right Kidney: Renal measurements: Prior right nephrectomy = volume:  mL. Left Kidney: Renal measurements: 11 x 5.8 x 4.7 cm = volume: 157 mL. 2.3 cm upper pole cyst appears simple. No follow-up imaging recommended. Moderate hydronephrosis. Mild cortical thinning. Normal echotexture. Bladder: Appears normal for degree of bladder distention. Other: None. IMPRESSION: Prior right nephrectomy. Moderate left hydronephrosis. Electronically Signed   By: Charlett Nose M.D.   On: 05/06/2023 01:18   DG Chest 2 View  Result Date: 05/06/2023 CLINICAL DATA:  Abnormal labs.  Back pain and itching. EXAM: CHEST - 2 VIEW COMPARISON:  06/01/2017 FINDINGS: Shallow inspiration. Heart size and pulmonary vascularity are normal for technique. Power port type central venous catheter with tip over the low SVC region. No pneumothorax. Suggestion of mild atelectasis in the left lung base. No airspace disease or consolidation. No pleural effusions. No pneumothorax. Mediastinal contours appear intact. Calcification of the aorta. IMPRESSION: Shallow inspiration with mild atelectasis in the left base. No edema or consolidation. Electronically Signed   By: Burman Nieves M.D.   On:  05/06/2023 00:57    Pending Labs Unresulted Labs (From admission, onward)     Start     Ordered   05/07/23 0500  Basic metabolic panel  Tomorrow morning,   R        05/06/23 0559   05/07/23 0500  CBC with Differential/Platelet  Tomorrow morning,   R        05/06/23 0559   05/06/23 0706  Basic metabolic panel  Once,   R        05/06/23 0706   05/06/23 0601  CEA  Once,   R        05/06/23 0601   05/06/23 0601  Cancer antigen 19-9  Once,   R        05/06/23 0601   05/06/23 0404  Glomerular basement membrane antibodies  Once,   URGENT        05/06/23 0403   05/06/23 0404  ANCA Titers  (Anti-Neutrophilic Cystoplasmic Antibody Panel (PNL))  Once,   URGENT        05/06/23 0403   05/06/23 0026  Phenytoin level, free and total  Once,   STAT        05/06/23 0025   05/06/23 0022  Microalbumin / creatinine urine ratio  Once,   URGENT        05/06/23 0023   05/06/23 0021  Urinalysis, Routine w reflex microscopic -Urine, Clean Catch  Once,   URGENT       Question:  Specimen Source  Answer:  Urine, Clean Catch  05/06/23 0023            Vitals/Pain Today's Vitals   05/06/23 0515 05/06/23 0600 05/06/23 0623 05/06/23 0743  BP: (!) 117/59 (!) 94/50  111/60  Pulse: 71 63  74  Resp: 20 19  (!) 23  Temp:   (!) 97.5 F (36.4 C)   TempSrc:   Oral   SpO2: 100% 100%  100%  PainSc:        Isolation Precautions No active isolations  Medications Medications  hydrOXYzine (ATARAX) tablet 25 mg (25 mg Oral Given 05/06/23 0052)  heparin injection 5,000 Units (has no administration in time range)  acetaminophen (TYLENOL) tablet 650 mg (has no administration in time range)    Or  acetaminophen (TYLENOL) suppository 650 mg (has no administration in time range)  senna-docusate (Senokot-S) tablet 1 tablet (has no administration in time range)  lactated ringers bolus 250 mL (0 mLs Intravenous Stopped 05/06/23 0204)  sodium zirconium cyclosilicate (LOKELMA) packet 5 g (5 g Oral Given 05/06/23 0143)   lactated ringers bolus 500 mL (0 mLs Intravenous Stopped 05/06/23 0503)  lactated ringers bolus 250 mL (0 mLs Intravenous Stopped 05/06/23 0503)  sodium bicarbonate injection 50 mEq (50 mEq Intravenous Given 05/06/23 0736)    Mobility walks with device     Focused Assessments Renal Assessment Handoff:  Hx one kidney   R Recommendations: See Admitting Provider Note  Report given to:   Additional Notes: hard of hearing

## 2023-05-06 NOTE — Consult Note (Addendum)
[facility] Resident Consultation Note  Requesting MD: No att. providers found Indication for Consultation:  AKI on CKD IIIb  Chief complaint: Diffuse itching  HPI:  Gilbert [name] is a 72 y.o. 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 He presents after blood work at PCP visit shows worsening renal function, admitted for AKI on CKD.  His chief concern is generalized itching, all over his body, sparing virtually no patch of skin. This has been ongoing for months and worsening to the point where he is constantly scratching and causing sores to form. He has tried emollients and steroid creams to no avail. He has tried pills prescribed by his doctors without improvement. Drinks water and tea at home. Generally eats two meals per day.  Review of Systems  Constitutional:  Positive for weight loss (30 lbs over last several weeks, unintentional).  Respiratory:  Negative for shortness of breath.   Gastrointestinal:  Negative for abdominal pain, nausea and vomiting.  Genitourinary:  Negative for dysuria and hematuria.       Decreased urine output over uncertain timeframe.  Musculoskeletal:  Positive for back pain (Left lower back) and neck pain.  Skin:  Positive for itching and rash.   Hospital course notable for CT renal stone protocol without contrast unremarkable for renal or urinary collecting system abnormalities to explain new renal insufficiency, with incidental finding of bone lesion of T11 vertebra concerning for sclerotic lesion/pathologic fracture. He has received some fluids via IV and sodium bicarbonate with some improvement in serum creatinine. Some mild hypotension since admission.  Creatinine  Date/Time Value Ref Range Status  07/11/2019 01:45 PM 1.25 (H) 0.61 - 1.24 mg/dL Final  64/40/3474 25:95 PM 1.30 (H) 0.61 - 1.24 mg/dL Final  63/87/5643 32:95 PM 1.34 (H) 0.61 - 1.24 mg/dL  Final  18/84/1660 63:01 PM 1.29 (H) 0.61 - 1.24 mg/dL Final  60/05/9322 55:73 AM 1.49 (H) 0.61 - 1.24 mg/dL Final  22/09/5425 06:23 PM 1.50 (H) 0.60 - 1.20 mg/dL Final   Creat  Date/Time Value Ref Range Status  04/20/2017 02:17 PM 1.3 (H) 0.6 - 1.2 mg/dl Final  76/28/3151 76:16 PM 1.4 (H) 0.6 - 1.2 mg/dl Final  07/37/1062 69:48 AM 0.8 0.6 - 1.2 mg/dl Final  54/62/7035 00:93 AM 0.9 0.6 - 1.2 mg/dl Final  81/82/9937 16:96 AM 0.8 0.6 - 1.2 mg/dl Final  78/93/8101 75:10 AM 1.0 0.6 - 1.2 mg/dl Final  25/85/2778 24:23 AM 1.0 0.6 - 1.2 mg/dl Final  53/61/4431 54:00 AM 1.3 (H) 0.6 - 1.2 mg/dl Final  86/76/1950 93:26 AM 1.0 0.6 - 1.2 mg/dl Final  71/24/5809 98:33 AM 1.2 0.6 - 1.2 mg/dl Final  82/50/5397 67:34 AM 1.5 (H) 0.6 - 1.2 mg/dl Final  19/37/9024 09:73 AM 1.5 (H) 0.6 - 1.2 mg/dl Final  53/29/9242 68:34 AM 1.4 (H) 0.6 - 1.2 mg/dl Final  19/62/2297 98:92 AM 1.25 0.70 - 1.25 mg/dL Final  11/94/1740 81:44 AM 1.5 (H) 0.6 - 1.2 mg/dl Final  81/85/6314 97:02 PM 1.36 (H) 0.70 - 1.25 mg/dL Final  63/78/5885 02:77 AM 1.2 0.6 - 1.2 mg/dl Final  41/28/7867 67:20 AM 1.23 0.50 - 1.35 mg/dL Final  94/70/9628 36:62 AM 1.2 0.6 - 1.2 mg/dl Final  94/76/5465 03:54 AM 1.15 0.50 - 1.35 mg/dL Final  65/68/1275 17:00 AM 1.2 0.6 - 1.2 mg/dl Final  17/49/4496 75:91 AM 1.22 0.50 - 1.35 mg/dL Final  63/84/6659 93:57 AM 1.2 0.6 - 1.2 mg/dl  Final  11/20/2013 10:16 AM 1.3 (H) 0.6 - 1.2 mg/dl Final  16/05/9603 54:09 PM 0.8 0.6 - 1.2 mg/dl Final  81/19/1478 29:56 PM 0.85 0.50 - 1.35 mg/dL Final  21/30/8657 84:69 PM 1.14 0.50 - 1.35 mg/dL Final   Creatinine, Ser  Date/Time Value Ref Range Status  05/06/2023 07:37 AM 4.15 (H) 0.61 - 1.24 mg/dL Final  62/95/2841 32:44 PM 4.43 (H) 0.61 - 1.24 mg/dL Final  08/04/7251 66:44 AM 4.31 (H) 0.40 - 1.50 mg/dL Final  03/47/4259 56:38 PM 1.42 (H) 0.61 - 1.24 mg/dL Final  75/64/3329 51:88 PM 1.68 (H) 0.40 - 1.50 mg/dL Final  41/66/0630 16:01 PM 1.78 (H) 0.40 - 1.50 mg/dL  Final  09/32/3557 32:20 PM 1.67 (H) 0.40 - 1.50 mg/dL Final  25/42/7062 37:62 PM 1.35 (H) 0.76 - 1.27 mg/dL Final  83/15/1761 60:73 PM 1.41 0.40 - 1.50 mg/dL Final  71/12/2692 85:46 AM 1.35 (H) 0.61 - 1.24 mg/dL Final  27/10/5007 38:18 AM 1.35 (H) 0.61 - 1.24 mg/dL Final  29/93/7169 67:89 AM 1.28 (H) 0.61 - 1.24 mg/dL Final  38/05/1750 02:58 PM 1.33 (H) 0.61 - 1.24 mg/dL Final  52/77/8242 35:36 AM 1.93 (H) 0.61 - 1.24 mg/dL Final  14/43/1540 08:67 PM 2.31 (H) 0.61 - 1.24 mg/dL Final  61/95/0932 67:12 PM 0.97 0.61 - 1.24 mg/dL Final  45/80/9983 38:25 PM 1.10 0.61 - 1.24 mg/dL Final  05/39/7673 41:93 AM 1.26 (H) 0.61 - 1.24 mg/dL Final  79/09/4095 35:32 AM 1.28 (H) 0.61 - 1.24 mg/dL Final  99/24/2683 41:96 PM 1.49 (H) 0.61 - 1.24 mg/dL Final  22/29/7989 21:19 PM 1.35 (H) 0.61 - 1.24 mg/dL Final  41/74/0814 48:18 PM 1.33 0.40 - 1.50 mg/dL Final  56/31/4970 26:37 AM 1.38 (H) 0.50 - 1.35 mg/dL Final  85/88/5027 74:12 AM 1.18 0.50 - 1.35 mg/dL Final  87/86/7672 09:47 PM 1.14 0.50 - 1.35 mg/dL Final  09/62/8366 29:47 AM 1.05 0.50 - 1.35 mg/dL Final  65/46/5035 46:56 AM 1.11 0.50 - 1.35 mg/dL Final  81/27/5170 01:74 AM 1.25 0.50 - 1.35 mg/dL Final  94/49/6759 16:38 AM 1.45 (H) 0.50 - 1.35 mg/dL Final  46/65/9935 70:17 AM 1.59 (H) 0.50 - 1.35 mg/dL Final  79/39/0300 92:33 PM 1.82 (H) 0.50 - 1.35 mg/dL Final  00/76/2263 33:54 AM 2.04 (H) 0.50 - 1.35 mg/dL Final  56/25/6389 37:34 PM 2.03 (H) 0.50 - 1.35 mg/dL Final  28/76/8115 72:62 AM 1.70 (H) 0.50 - 1.35 mg/dL Final  03/55/9741 63:84 PM 1.59 (H) 0.50 - 1.35 mg/dL Final  53/64/6803 21:22 PM 1.65 (H) 0.50 - 1.35 mg/dL Final  48/25/0037 04:88 PM 1.69 (H) 0.50 - 1.35 mg/dL Final  89/16/9450 38:88 AM 1.26 0.50 - 1.35 mg/dL Final  28/00/3491 79:15 PM 1.38 (H) 0.50 - 1.35 mg/dL Final  05/69/7948 01:65 PM 1.37 (H) 0.50 - 1.35 mg/dL Final  53/74/8270 78:67 AM 1.04 0.50 - 1.35 mg/dL Final  54/49/2010 07:12 PM 0.95 0.50 - 1.35 mg/dL Final   19/75/8832 54:98 PM 0.73 0.50 - 1.35 mg/dL Final  26/41/5830 94:07 AM 1.45 (H) 0.50 - 1.35 mg/dL Final     PMHx: Past Medical History:  Diagnosis Date   Abnormal liver enzymes    Chronic alkaline phosphatase elevation since 2014   Acid reflux    CAD (coronary artery disease)    Cancer (HCC) 08/2013   right kidney cancer   Cancer (HCC)    Coronary artery calcification seen on CAT scan    Coronary atherosclerosis of native coronary artery    Diffuse large  cell non-Hodgkin's lymphoma (HCC) 01/07/2016   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 Used Impella Circulatory assist device. EF= 20-25%   History of epilepsy    at least 10 years ago. 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 EF 45% 04/10/2014   Therapeutic drug monitoring     Past Surgical History:  Procedure Laterality Date   BALLOON DILATION N/A 01/21/2015   Procedure: BALLOON DILATION;  Surgeon: Iva Boop, MD;  Location: [facility] ENDOSCOPY;  Service: Endoscopy;  Laterality: N/A;   CARDIAC CATHETERIZATION N/A 06/10/2015   Procedure: Left Heart Cath and Coronary Angiography;  Surgeon: Yates Decamp, MD;  Location: [facility] INVASIVE CV LAB;  Service: Cardiovascular;  Laterality: N/A;   CORONARY  ANGIOPLASTY WITH STENT PLACEMENT  08/02/2013   Left main coronary artery stenting on emergent basis along with circulatory support with Impella device through left leg. With 4.0 x 18 mm Xience alpine stent   EAR CYST EXCISION Left 05/31/2018   Procedure: EXCISION LEFT POSTERIOR EAR TUMOR;  Surgeon: Newman Pies, MD;  Location: [facility] OR;  Service: ENT;  Laterality: Left;   ESOPHAGOGASTRODUODENOSCOPY     ESOPHAGOGASTRODUODENOSCOPY N/A 01/21/2015   Procedure: ESOPHAGOGASTRODUODENOSCOPY (EGD) with possible dilation.;  Surgeon: Iva Boop, MD;  Location: [facility] ENDOSCOPY;  Service: Endoscopy;  Laterality: N/A;   EYE SURGERY Left 2 weeks ago   torn retina   ILIAC ARTERY STENT Left 05/28/2014   dr Jacinto Halim   LEFT HEART CATHETERIZATION WITH CORONARY ANGIOGRAM N/A 11/27/2013   Procedure: LEFT HEART CATHETERIZATION WITH CORONARY ANGIOGRAM;  Surgeon: Pamella Pert, MD;  Location: [facility] CATH LAB;  Service: Cardiovascular;  Laterality: N/A;   LOWER EXTREMITY ANGIOGRAM N/A 02/26/2014   Procedure: LOWER EXTREMITY ANGIOGRAM;  Surgeon: Pamella Pert, MD;  Location: [facility] CATH LAB;  Service: Cardiovascular;  Laterality: N/A;   LOWER EXTREMITY ANGIOGRAM N/A 05/28/2014   Procedure: LOWER EXTREMITY ANGIOGRAM;  Surgeon: Pamella Pert, MD;  Location: [facility] CATH LAB;  Service: Cardiovascular;  Laterality: N/A;   NEPHRECTOMY  11/30/2013   PERCUTANEOUS CORONARY STENT INTERVENTION (PCI-S)  11/27/2013   Procedure: PERCUTANEOUS CORONARY STENT INTERVENTION (PCI-S);  Surgeon: Pamella Pert, MD;  Location: [facility] CATH LAB;  Service: Cardiovascular;;   RIGHT/LEFT HEART CATH AND CORONARY ANGIOGRAPHY N/A 03/23/2022   Procedure: RIGHT/LEFT HEART CATH AND CORONARY ANGIOGRAPHY;  Surgeon: Yates Decamp, MD;  Location: [facility] INVASIVE CV LAB;  Service: Cardiovascular;  Laterality: N/A;   ROBOT ASSISTED LAPAROSCOPIC NEPHRECTOMY Right 10/19/2013   Procedure: ROBOTIC ASSISTED LAPAROSCOPIC NEPHRECTOMY,  EXTENSIVE ADHESIOLYSIS;  Surgeon: Sebastian Ache, MD;  Location: [facility] ORS;  Service: Urology;  Laterality: Right;  SKIN FULL THICKNESS GRAFT Left 05/31/2018   Procedure: SKIN GRAFT FULL THICKNESS FROM LEFT ABDOMEN TO LEFT EAR;  Surgeon: Newman Pies, MD;  Location: [facility] OR;  Service: ENT;  Laterality: Left;    Family Hx:  Family History  Problem Relation Age of Onset   Stroke Mother    Emphysema Father    Heart disease Brother        multiple stents   Hyperlipidemia Brother    Stomach cancer Maternal Grandmother    Colon cancer Neg Hx    Colon polyps Neg Hx    Esophageal cancer Neg Hx    Rectal cancer Neg Hx     Social History:  reports that he quit smoking about 19 years ago. His smoking use included cigarettes. He started smoking about 64 years ago. He has a 90 pack-year smoking history. His smokeless tobacco use includes chew. He reports that he does not drink alcohol and does not use drugs.  Allergies:  Allergies  Allergen Reactions   Oxycodone Swelling and Other (See Comments)    Tongue and lips swell    Medications: Prior to Admission medications   Medication Sig Start Date End Date Taking? Authorizing Provider  acetaminophen (TYLENOL) 500 MG tablet Take 1,000 mg by mouth daily.   Yes [provider]  APPLE CIDER VINEGAR PO Take 480 mg by mouth daily.   Yes [provider]  carvedilol (COREG) 12.5 MG tablet Take 1 tablet (12.5 mg total) by mouth 2 (two) times daily with a meal. 10/06/22  Yes Yates Decamp, MD  clopidogrel (PLAVIX) 75 MG tablet Take 1 tablet (75 mg total) by mouth daily. 10/06/22  Yes Yates Decamp, MD  Cyanocobalamin (B-12) 2500 MCG TABS Take 2,500 mcg by mouth every morning.   Yes [provider]  ezetimibe (ZETIA) 10 MG tablet Take 1 tablet (10 mg total) by mouth daily. 10/06/22  Yes Yates Decamp, MD  famotidine (PEPCID) 20 MG tablet Take 20 mg by mouth 2 (two) times daily.   Yes [provider]  furosemide (LASIX) 40 MG tablet Take 1 tablet (40 mg total) by mouth daily as needed.  For fluid build up or weight gain of 2 Lbs Patient taking differently: Take 40 mg by mouth daily as needed. Alternates between 20mg  and 10mg . Takes whole tablet then half then whole etc 10/06/22  Yes Yates Decamp, MD  isosorbide mononitrate (IMDUR) 60 MG 24 hr tablet Take 1 tablet (60 mg total) by mouth daily. 10/06/22 10/01/23 Yes Yates Decamp, MD  phenytoin (DILANTIN) 100 MG ER capsule Take 3 capsules (300 mg total) by mouth daily. 05/05/23  Yes Shade Flood, MD  pyridOXINE (VITAMIN B-6) 100 MG tablet Take 100 mg by mouth every morning.    Yes [provider]  rosuvastatin (CRESTOR) 40 MG tablet Take 1 tablet (40 mg total) by mouth daily. 05/05/23 04/29/24 Yes Shade Flood, MD  sacubitril-valsartan (ENTRESTO) 49-51 MG Take 1 tablet by mouth 2 (two) times daily. 10/06/22  Yes Yates Decamp, MD  Thiamine HCl (VITAMIN B-1) 250 MG tablet Take 250 mg by mouth every morning.    Yes [provider]  hydrOXYzine (VISTARIL) 25 MG capsule Take 1 capsule (25 mg total) by mouth every 8 (eight) hours as needed. 05/05/23   Shade Flood, MD  predniSONE (DELTASONE) 20 MG tablet Take 2 tablets (40 mg total) by mouth daily with breakfast. 05/05/23   Shade Flood, MD    I have reviewed the patient's current  medications. Takes medicines as prescribed. Avoids NSAIDs per doctor's instructions.  Labs:     Latest Ref Rng & Units 05/06/2023    7:37 AM 05/05/2023   11:26 PM 05/05/2023   11:13 AM  BMP  Glucose 70 - 99 mg/dL 78  92  90   BUN 8 - 23 mg/dL 62  66  62   Creatinine 0.61 - 1.24 mg/dL 5.78  4.69  6.29   Sodium 135 - 145 mmol/L 135  138  135   Potassium 3.5 - 5.1 mmol/L 4.9  5.8  5.7   Chloride 98 - 111 mmol/L 110  110  110   CO2 22 - 32 mmol/L 15  13  17    Calcium 8.9 - 10.3 mg/dL 8.5  9.1  9.3     Urinalysis    Component Value Date/Time   COLORURINE YELLOW 05/06/2023 0032   APPEARANCEUR CLEAR 05/06/2023 0032   LABSPEC 1.016 05/06/2023 0032   PHURINE 5.0 05/06/2023 0032    GLUCOSEU NEGATIVE 05/06/2023 0032   HGBUR NEGATIVE 05/06/2023 0032   BILIRUBINUR NEGATIVE 05/06/2023 0032   BILIRUBINUR negative 11/05/2015 1042   KETONESUR NEGATIVE 05/06/2023 0032   PROTEINUR NEGATIVE 05/06/2023 0032   UROBILINOGEN 0.2 11/05/2015 1042   UROBILINOGEN 0.2 06/24/2013 1312   NITRITE NEGATIVE 05/06/2023 0032   LEUKOCYTESUR NEGATIVE 05/06/2023 0032     ROS:  Pertinent items are noted in HPI.  Physical Exam: Vitals:   05/06/23 0937 05/06/23 1334  BP: (!) 104/54 (!) 90/46  Pulse: 66   Resp: 20 16  Temp: 99.5 F (37.5 C)   SpO2: 100%       No distress, sleeping on my arrival Heart rate is normal, rhythm regular, weak radial pulses, no appreciable murmurs, no lower extremity edema Breathing is comfortable on room air, anterior lung fields clear Abdomen is non-tender No costovertebral angle tenderness Skin with diffuse excoriations No spinal tenderness No lesions visible on penis Alert and oriented, answers questions appropriately, follows instructions, no asterixis  Assessment/Plan:  72 year old male with 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  AKI on CKD IIIb Creatinine peak 4.43 from baseline around 1.5. BUN 62. No uremic symptoms at present. Query pre-renal etiology as he looks a bit dry on my exam. He has had some hypotension as well, likely pre-hospital as well, which is contributing. He has lost a lot of weight recently and BP and heart failure medication Begnaud need to be restarted at lower dose, but agree with holding for now. Continue normal saline infusion at 75 mL per hour x 24 hours, trend creatinine, will follow along. No acute HD indication.  Generalized pruritus Query uremia versus medication reaction versus paraneoplastic syndrome. Phosphorus drives pruritus in CKD but this isn't elevated. Dilantin was changed from 300 mg in  1 tablet to 300 mg in 3 x 100 mg tablets but otherwise no medication changes around time of symptom onset. Will order aquaphor for this, continue workup per primary team.  Ischemic cardiomyopathy Well compensated, if not somewhat dry at present. Doubt cardiorenal syndrome. Cautious IV fluid hydration to avoid volume overload and pulmonary edema. Agree with holding Entresto, Imdur, and Lasix for now.  ASCVD Statin per primary team.  History of hypertension Hypotensive at present. Okay to continue carvedilol but pause heart failure medications per above.  Chronic anemia No overt bleeding, no hematuria. Query anemia of CKD versus smoldering  lymphoproliferative disorder. Add on iron studies.  Metabolic acidosis P.o. sodium bicarbonate, 650 mg TID.  Seizures Phenytoin per primary team.  Final recommendations pending attending attestation.  Marrianne Mood MD 05/06/2023, 2:05 PM     Seen and examined independently.  Agree with note and exam as documented above by resident physician Dr. Benito Mccreedy and as noted here.  Gilbert [name] is a pleasant gentleman with a history of CKD stage III, ischemic cardiomyopathy, history of seizures, diffuse large B-cell lymphoma, solitary kidney status post right nephrectomy who presented to the hospital with AKI on labs with his PCP as well as debilitating, generalized itching.  He was found to have creatinine greater than 4.  Baseline is usually around 1.5.  He was initially slightly hyperkalemic at 5.7.  temporized with bicarb. He denies urinary difficulty.  He denies any NSAID use.  Has been taking his meds.  He has had an unintentional 30 pound weight loss over the past couple of months. He denies n/v or metallic taste.  General adult male in bed in no acute distress HEENT normocephalic atraumatic extraocular movements intact sclera anicteric Neck supple trachea midline Lungs clear to auscultation bilaterally normal work of breathing at rest  Heart S1S2 no  rub Abdomen soft nontender nondistended Extremities no edema  Skin scattered excoriations and healing lesions/scabbed lesions and some bleeding lesions on his arms, legs, trunk and back Psych normal mood and affect Neuro alert and oriented x3 provides hx and follows commands   AKI  - secondary to pre-renal and ischemic insults.  He likely had hypotension and a decreased requirement for blood pressure medications in the setting of unintentional weight loss - hold entresto and lasix - gently hydrate - NS at 75 ml/hr x 24 hours and will reassess   CKD stage 3 - baseline Cr 1.5  Ischemic CM  - as above hold entresto and lasix - gently hydrate - NS at 75 ml/hr x 24 hours and will reassess    HTN  - holding entresto and lasix  - reduce coreg - he wasn't given the dose today - agents are now directed at cardiomyopathy   Anemia of CKD  - normocytic anemia  - Trend Hb   Metabolic acidosis  - start oral bicarbonate  Generalized pruritus  - phos is acceptable - optimize renal function as above - would exclude malignancy as below - ordered aquaphor     Unintentional weight loss  - Concerning for malignancy  - would ensure he has had appropriate cancer screening  - note hx of renal mass/malignancy and nephrectomy for the same   Please continue inpatient monitoring   Estanislado Emms, MD 05/06/2023 8:51 PM

## 2023-05-07 ENCOUNTER — Inpatient Hospital Stay (HOSPITAL_COMMUNITY): Payer: Medicare Other

## 2023-05-07 DIAGNOSIS — N179 Acute kidney failure, unspecified: Secondary | ICD-10-CM | POA: Diagnosis not present

## 2023-05-07 DIAGNOSIS — N189 Chronic kidney disease, unspecified: Secondary | ICD-10-CM

## 2023-05-07 LAB — CBC WITH DIFFERENTIAL/PLATELET
Abs Immature Granulocytes: 0.03 10*3/uL (ref 0.00–0.07)
Basophils Absolute: 0.1 10*3/uL (ref 0.0–0.1)
Basophils Relative: 1 %
Eosinophils Absolute: 0.9 10*3/uL — ABNORMAL HIGH (ref 0.0–0.5)
Eosinophils Relative: 17 %
HCT: 25.2 % — ABNORMAL LOW (ref 39.0–52.0)
Hemoglobin: 8.1 g/dL — ABNORMAL LOW (ref 13.0–17.0)
Immature Granulocytes: 1 %
Lymphocytes Relative: 13 %
Lymphs Abs: 0.6 10*3/uL — ABNORMAL LOW (ref 0.7–4.0)
MCH: 28.5 pg (ref 26.0–34.0)
MCHC: 32.1 g/dL (ref 30.0–36.0)
MCV: 88.7 fL (ref 80.0–100.0)
Monocytes Absolute: 0.4 10*3/uL (ref 0.1–1.0)
Monocytes Relative: 7 %
Neutro Abs: 3 10*3/uL (ref 1.7–7.7)
Neutrophils Relative %: 61 %
Platelets: 141 10*3/uL — ABNORMAL LOW (ref 150–400)
RBC: 2.84 MIL/uL — ABNORMAL LOW (ref 4.22–5.81)
RDW: 13.8 % (ref 11.5–15.5)
WBC: 4.9 10*3/uL (ref 4.0–10.5)
nRBC: 0 % (ref 0.0–0.2)

## 2023-05-07 LAB — MICROALBUMIN / CREATININE URINE RATIO
Creatinine, Urine: 183.2 mg/dL
Microalb Creat Ratio: 7 mg/g{creat} (ref 0–29)
Microalb, Ur: 12.1 ug/mL — ABNORMAL HIGH

## 2023-05-07 LAB — OCCULT BLOOD X 1 CARD TO LAB, STOOL: Fecal Occult Bld: NEGATIVE

## 2023-05-07 LAB — BASIC METABOLIC PANEL
Anion gap: 10 (ref 5–15)
BUN: 54 mg/dL — ABNORMAL HIGH (ref 8–23)
CO2: 16 mmol/L — ABNORMAL LOW (ref 22–32)
Calcium: 8.1 mg/dL — ABNORMAL LOW (ref 8.9–10.3)
Chloride: 113 mmol/L — ABNORMAL HIGH (ref 98–111)
Creatinine, Ser: 3.26 mg/dL — ABNORMAL HIGH (ref 0.61–1.24)
GFR, Estimated: 19 mL/min — ABNORMAL LOW (ref >60)
Glucose, Bld: 85 mg/dL (ref 70–99)
Potassium: 4.4 mmol/L (ref 3.5–5.1)
Sodium: 139 mmol/L (ref 135–145)

## 2023-05-07 MED ORDER — CARVEDILOL 3.125 MG PO TABS
3.1250 mg | ORAL_TABLET | Freq: Two times a day (BID) | ORAL | Status: DC
  Administered 2023-05-07 – 2023-05-12 (×10): 3.125 mg via ORAL
  Filled 2023-05-07 (×10): qty 1

## 2023-05-07 NOTE — Plan of Care (Signed)

## 2023-05-07 NOTE — Progress Notes (Signed)
Hemoccult sample sent to lab.

## 2023-05-07 NOTE — Progress Notes (Signed)
[facility] Progress Note  Name: Gilbert [name] MRN: [id number] DOB: July 13, 1951  Chief Complaint:  Sent with abnormal labs (AKI) and itching   Subjective:  He had 750 mL uop over 10/4 as well as 2 unmeasured urine voids.  He has been on normal saline at 75 ml/hr.  He feels ok right now.  He asks that I call his daughter, Gilbert [name], at [phone].  I updated Gilbert [name] and answered her questions; she appreciated the call.   Review of systems:  Denies nausea or vomiting  Denies shortness of breath or chest pain Still itching but maybe a little better    Intake/Output Summary (Last 24 hours) at 05/07/2023 0912 Last data filed at 05/07/2023 0800 Gross per 24 hour  Intake 360 ml  Output 900 ml  Net -540 ml    Vitals:  Vitals:   05/06/23 2026 05/07/23 0424 05/07/23 0825 05/07/23 0825  BP: (!) 101/55 109/81 (!) 117/58 (!) 117/58  Pulse: 72 81 79 79  Resp: 18 18  19   Temp: 98.7 F (37.1 C) 98.5 F (36.9 C)  97.9 F (36.6 C)  TempSrc: Oral Oral  Oral  SpO2: 100% 100%  100%     Physical Exam:  General adult male in bed in no acute distress HEENT normocephalic atraumatic extraocular movements intact sclera anicteric Neck supple trachea midline Lungs clear to auscultation bilaterally normal work of breathing at rest on room air Heart S1S2 no rub Abdomen soft nontender nondistended Extremities no edema  Skin scattered excoriations and healing lesions/scabbed lesions and some bleeding lesions on his arms, legs, trunk.  There is blood on his gown in multiple places from scratching the lesions Psych normal mood and affect Neuro alert and oriented x3 provides hx and follows commands   Medications reviewed   Labs:     Latest Ref Rng & Units 05/07/2023    5:28 AM 05/06/2023    7:37 AM 05/05/2023   11:26 PM  BMP  Glucose 70 - 99 mg/dL 85  78  92   BUN 8 - 23 mg/dL 54  62  66   Creatinine 0.61 - 1.24 mg/dL 9.52  8.41  3.24   Sodium 135 - 145 mmol/L 139  135  138   Potassium  3.5 - 5.1 mmol/L 4.4  4.9  5.8   Chloride 98 - 111 mmol/L 113  110  110   CO2 22 - 32 mmol/L 16  15  13    Calcium 8.9 - 10.3 mg/dL 8.1  8.5  9.1      Assessment/Plan:   # AKI  - secondary to pre-renal and ischemic insults.  He likely had hypotension and a decreased requirement for blood pressure medications in the setting of unintentional weight loss.  Note hx of nephrectomy and therefore decreased renal reserve - Improving with supportive care  - hold home entresto and lasix - gently hydrate - continue NS at 75 ml/hr for an additional 24 hours and will reassess tomorrow    # CKD stage 3 - baseline Cr 1.5  # Solitary kidney  - s/p right nephrectomy in 2015 with Dr. Berneice Heinrich   # Ischemic CM  - note EF 40% by 11/2022 echocardiogram - as above hold entresto and lasix - gently hydrate - NS at 75 ml/hr x 24 hours and will reassess      # HTN  - holding entresto and lasix  - reduced coreg to now 3.125 mg BID - agents are now directed at cardiomyopathy  -  his anti-hypertensive requirement has likely changed with his weight loss   # Anemia of CKD  - normocytic anemia  - Trend Hb  - his iron is replete - hopeful for eventual improvement with improving AKI if his AKI is contributing.  His unintentional weight loss is concerning for occult malignancy so would first exclude this prior to giving ESA    # Metabolic acidosis  - due to AKI and also is getting non-bicarb-containing fluids - on oral bicarbonate which Wieser be temporary   # Generalized pruritus  - phos is acceptable so this would not be the explanation - optimize renal function as above to see if this improves with improving renal failure - would exclude malignancy as below - ordered aquaphor      # Unintentional weight loss  - Concerning for malignancy  - would ensure he has had appropriate cancer screening  - note hx of renal mass/malignancy and nephrectomy for the same    Please continue inpatient monitoring   Estanislado Emms, MD 05/07/2023 9:37 AM

## 2023-05-07 NOTE — Plan of Care (Signed)

## 2023-05-07 NOTE — Progress Notes (Signed)
PROGRESS NOTE    Gilbert [name]  NWG:[id number]  DOB: [birth year]  DOA: 05/05/2023 PCP: Shade Flood, MD Outpatient Specialists:   Hospital course:  72 year old man with history of clear-cell cancer s/p right nephrectomy, CKD 3A, large diffuse B-cell lymphoma in remission, HTN, seizure disorder, PAD was admitted yesterday for AKI found on routine blood work.  Patient also notes that he has had unintentional weight loss of about 30 pounds over the past 3 to 4 months.   Subjective:  Patient does not think he feels much better.  I reviewed his weights from his PCP office and noted that he lost about 13 pounds in [place] 2023 and has lost another 4 pounds since then.  He notes that it is possible that he is lost 15 pounds, is not sure.  Does admit that he has not been eating as much notes "they took my food stamps away".  Patient is unwilling to say that he has food insecurity but does note that he does not necessarily eat as much as he would like.   Objective: Vitals:   05/07/23 0424 05/07/23 0825 05/07/23 0825 05/07/23 1523  BP: 109/81 (!) 117/58 (!) 117/58 (!) 105/54  Pulse: 81 79 79 63  Resp: 18  19 20   Temp: 98.5 F (36.9 C)  97.9 F (36.6 C) 97.9 F (36.6 C)  TempSrc: Oral  Oral Oral  SpO2: 100%  100% 96%    Intake/Output Summary (Last 24 hours) at 05/07/2023 1614 Last data filed at 05/07/2023 1346 Gross per 24 hour  Intake 400 ml  Output 1000 ml  Net -600 ml   There were no vitals filed for this visit.   Exam:  General: Patient lying in bed in NAD Eyes: sclera anicteric, conjuctiva mild injection bilaterally CVS: S1-S2, regular  Respiratory:  decreased air entry bilaterally secondary to decreased inspiratory effort, rales at bases  GI: NABS, soft, NT  LE: Warm and well-perfused Neuro: A/O x 3,  grossly nonfocal.  Psych: patient is logical and coherent, judgement and insight appear normal, mood and affect appropriate to situation.  Data Reviewed:  Basic  Metabolic Panel: Recent Labs  Lab 05/05/23 1113 05/05/23 2326 05/06/23 0032 05/06/23 0616 05/06/23 0737 05/07/23 0528  NA 135 138  --   --  135 139  K 5.7* 5.8*  --   --  4.9 4.4  CL 110 110  --   --  110 113*  CO2 17* 13*  --   --  15* 16*  GLUCOSE 90 92  --   --  78 85  BUN 62* 66*  --   --  62* 54*  CREATININE 4.31* 4.43*  --   --  4.15* 3.26*  CALCIUM 9.3 9.1  --   --  8.5* 8.1*  PHOS  --   --  4.3 4.1  --   --     CBC: Recent Labs  Lab 05/05/23 1113 05/05/23 2206 05/07/23 0528  WBC 12.6* 8.8 4.9  NEUTROABS  --  5.1 3.0  HGB 10.0* 9.5* 8.1*  HCT 30.9* 29.7* 25.2*  MCV 90.1 90.5 88.7  PLT 241.0 216 141*     Scheduled Meds:  calcium carbonate  1 tablet Oral BID WC   carvedilol  3.125 mg Oral BID WC   clopidogrel  75 mg Oral Daily   ezetimibe  10 mg Oral Daily   famotidine  20 mg Oral Daily   heparin  5,000 Units Subcutaneous Q8H   phenytoin  300 mg Oral Daily   rosuvastatin  40 mg Oral Daily   sodium bicarbonate  650 mg Oral TID   Continuous Infusions:  sodium chloride 75 mL/hr at 05/07/23 0608     Assessment & Plan:   AKI on CKD 3B Metabolic acidosis Appreciate nephrology input AKI thought to be prerenal in setting of Entresto use [place] also need to revisit his antihypertensive regimen Renal function is improving on modest hydration of normal saline at 75 Continue oral bicarb per nephrology  Anemia Patient with decreasing hemoglobin from 10 to 8.1 today. Will send stool for guaiac and follow CBC closely Certainly GI bleed could be because of low BP and contribute to AKI  Unexplained weight  Possible food insecurity On chart review, PCP notes mention that patient weighed 177 pounds on [place] 2023, subsequently noted to have weight of 164 also in [place] 2023 and was noted to be weighing 160 pounds yesterday. As per above discussion, patient Obryant have some food insecurity, patient attributes his weight loss to not having as much food since his food stamps  were discontinued. TOC consult placed for assistance  Groundglass opacities  Possible T11 pathologic fracture Of note patient does have history of clear-cell carcinoma of the kidneys s/p nephrectomy and diffuse large B-cell lymphoma presumably in remission, previously followed by Dr. Myna Hidalgo. On abdomen/pelvis renal protocol CT, patient is noted to have groundglass opacities in right lower lobe and right middle lobe as well as possible pathologic fracture at T11. LDH however is essentially normal Will order noncontrast chest CT for better evaluation Consider spinal MRI for better look at T11  HFrEF HTN Echo in 11/18/2022 showed EF of 40% BP is low normal off of his Entresto, hydrochlorothiazide and decreased carvedilol only 3.125 twice daily Entresto is being held given likely prerenal causes of AKI Will need to follow fluid status closely as we hydrate patient while holding his Entresto Can consider repeat echocardiogram if BP does not improve as is expected with IV fluid resuscitation  CAD PVD Continue Plavix and Zetia  Seizure disorder Continue Dilantin      DVT prophylaxis: Subcu heparin Code Status: Full Family Communication: None today     Studies: CT Renal Stone Study  Result Date: 05/06/2023 CLINICAL DATA:  Abdominal and flank pain. Stone suspected. Prior history of right nephrectomy for carcinoma, with left hydronephrosis noted on renal ultrasound today. Additional history non-Hodgkin's lymphoma. EXAM: CT ABDOMEN AND PELVIS WITHOUT CONTRAST TECHNIQUE: Multidetector CT imaging of the abdomen and pelvis was performed following the standard protocol without IV contrast. RADIATION DOSE REDUCTION: This exam was performed according to the departmental dose-optimization program which includes automated exposure control, adjustment of the mA and/or kV according to patient size and/or use of iterative reconstruction technique. COMPARISON:  CT abdomen and pelvis without contrast  10/21/2015, PET-CT 07/29/2016. No intervening studies. FINDINGS: Lower chest: Mild diffuse bronchial thickening. There is a calcified granuloma in the left lower lobe. There are peripheral ground-glass opacities laterally in the right lower and middle lobes which could be post pneumonic scarring or active pneumonitis. No other focal infiltrate is seen. There is mild posterior atelectasis. The cardiac size is normal. There are three-vessel coronary calcifications. There is no pericardial effusion. There is mild subareolar dendritic gynecomastia. Hepatobiliary: The liver, gallbladder and bile ducts are unremarkable without contrast. Pancreas: Partially atrophic, otherwise unremarkable without contrast. Spleen: No focal abnormality.  No splenomegaly. Adrenals/Urinary Tract: Old right nephroadrenalectomy. No mass is seen in the nephrectomy bed. Left kidney demonstrating a  2.2 cm homogeneous thin walled cyst in the outer midpole, Hounsfield density of 9.5. This was simple on ultrasound. No follow-up imaging is recommended. There is no other contour deforming abnormality. No adrenal mass. There is no urinary stone or obstruction. There was thought to be hydronephrosis on today's renal ultrasound but it is not seen on CT. There is moderate renal sinus lipomatosis which Marcy have mimicked the sonographic appearance of hydronephrosis. The bladder is unremarkable for the degree of distention. Stomach/Bowel: No dilatation or wall thickening including of the appendix. 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 There is sigmoid diverticulosis without evidence of acute diverticulitis. Vascular/Lymphatic: There is moderate to heavy aortoiliac calcific plaque, with prior stenting in the left common iliac and external iliac arteries. No adenopathy is seen. Reproductive: Prostatomegaly.  The prostate is 4.7 cm transverse. Other:  No abdominal wall hernia or abnormality. No abdominopelvic ascites. Musculoskeletal: There is new mild anterior wedging of the T11 vertebral body with asymmetric sclerosis in the left 2/3 of the vertebral body. Possible this could be due to a sclerotic lesion with pathologic fracture. Bone scintigraphy is recommended. Age of findings is indeterminate but none of this was seen in 2017. There are grossly stable sclerotic lesions measuring 1 cm and 5 mm anteriorly in the L4 vertebral body, most likely due to bone islands. Small scattered sclerotic foci in the sacrum are unchanged. There is osteopenia and degenerative change of the spine. IMPRESSION: 1. No urinary stone or obstruction. There was thought to be hydronephrosis on today's renal ultrasound but it is not seen on CT. There is moderate renal sinus lipomatosis which Rizzo have mimicked the sonographic appearance of hydronephrosis. 2. Peripheral ground-glass opacities in the right lower and middle lobes which could be post pneumonic scarring or active pneumonitis. 3. Bronchitis. 4. 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 No bowel obstruction or inflammation. 5. Sigmoid diverticulosis. 6. Prostatomegaly. 7. New mild anterior wedging of the T11 vertebral body with asymmetric sclerosis in the left 2/3 of the vertebral body. Possible this could be due to a sclerotic lesion with pathologic fracture. Bone scintigraphy or MRI recommended. 8. Aortic and coronary artery atherosclerosis. Aortic Atherosclerosis (ICD10-I70.0). Electronically Signed   By: Almira Bar M.D.   On: 05/06/2023 03:26   US RENAL  Result Date: 05/06/2023 CLINICAL DATA:  Acute kidney injury EXAM: RENAL / URINARY TRACT ULTRASOUND COMPLETE COMPARISON:  PET CT 07/29/2016 FINDINGS: Right Kidney: Renal measurements: Prior right nephrectomy = volume:  mL. Left Kidney: Renal measurements: 11 x 5.8 x 4.7 cm  = volume: 157 mL. 2.3 cm upper pole cyst appears simple. No follow-up imaging recommended. Moderate hydronephrosis. Mild cortical thinning. Normal echotexture. Bladder: Appears normal for degree of bladder distention. Other: None. IMPRESSION: Prior right nephrectomy. Moderate left hydronephrosis. Electronically Signed   By: Charlett Nose M.D.   On: 05/06/2023 01:18   DG Chest 2 View  Result Date: 05/06/2023 CLINICAL DATA:  Abnormal labs.  Back pain and itching. EXAM: CHEST - 2 VIEW COMPARISON:  06/01/2017 FINDINGS: Shallow inspiration. Heart size and pulmonary vascularity are normal for technique. Power port type central venous catheter with tip over the low SVC region. No pneumothorax. Suggestion of mild atelectasis in the left lung base. No airspace disease or consolidation. No pleural effusions. No pneumothorax. Mediastinal contours appear intact. Calcification of the aorta. IMPRESSION: Shallow inspiration with  mild atelectasis in the left base. No edema or consolidation. Electronically Signed   By: Burman Nieves M.D.   On: 05/06/2023 00:57    Principal Problem:   Acute kidney injury superimposed on chronic kidney disease (HCC) Active Problems:   Renal cancer (HCC)   Seizures (HCC)   Essential hypertension   PAD (peripheral artery disease) (HCC)   Diffuse large cell non-Hodgkin's lymphoma (HCC)   Chronic systolic heart failure (HCC)   Hyperkalemia   Metabolic acidosis     Daquana Paddock Tublu Ezechiel [place], [facility]  If 7PM-7AM, please contact night-coverage [email or web address]   LOS: 1 day

## 2023-05-08 DIAGNOSIS — N189 Chronic kidney disease, unspecified: Secondary | ICD-10-CM | POA: Diagnosis not present

## 2023-05-08 DIAGNOSIS — N179 Acute kidney failure, unspecified: Secondary | ICD-10-CM | POA: Diagnosis not present

## 2023-05-08 LAB — COMPREHENSIVE METABOLIC PANEL
ALT: 17 U/L (ref 0–44)
AST: 23 U/L (ref 15–41)
Albumin: 2.2 g/dL — ABNORMAL LOW (ref 3.5–5.0)
Alkaline Phosphatase: 110 U/L (ref 38–126)
Anion gap: 9 (ref 5–15)
BUN: 38 mg/dL — ABNORMAL HIGH (ref 8–23)
CO2: 16 mmol/L — ABNORMAL LOW (ref 22–32)
Calcium: 7.9 mg/dL — ABNORMAL LOW (ref 8.9–10.3)
Chloride: 116 mmol/L — ABNORMAL HIGH (ref 98–111)
Creatinine, Ser: 2.55 mg/dL — ABNORMAL HIGH (ref 0.61–1.24)
GFR, Estimated: 26 mL/min — ABNORMAL LOW (ref >60)
Glucose, Bld: 81 mg/dL (ref 70–99)
Potassium: 4.3 mmol/L (ref 3.5–5.1)
Sodium: 141 mmol/L (ref 135–145)
Total Bilirubin: 0.3 mg/dL (ref 0.3–1.2)
Total Protein: 5 g/dL — ABNORMAL LOW (ref 6.5–8.1)

## 2023-05-08 LAB — CEA: CEA: 4 ng/mL (ref 0.0–4.7)

## 2023-05-08 LAB — CBC
HCT: 27.5 % — ABNORMAL LOW (ref 39.0–52.0)
Hemoglobin: 8.9 g/dL — ABNORMAL LOW (ref 13.0–17.0)
MCH: 29.5 pg (ref 26.0–34.0)
MCHC: 32.4 g/dL (ref 30.0–36.0)
MCV: 91.1 fL (ref 80.0–100.0)
Platelets: 134 10*3/uL — ABNORMAL LOW (ref 150–400)
RBC: 3.02 MIL/uL — ABNORMAL LOW (ref 4.22–5.81)
RDW: 13.9 % (ref 11.5–15.5)
WBC: 4.8 10*3/uL (ref 4.0–10.5)
nRBC: 0 % (ref 0.0–0.2)

## 2023-05-08 LAB — TYPE AND SCREEN
ABO/RH(D): A POS
Antibody Screen: NEGATIVE

## 2023-05-08 LAB — CANCER ANTIGEN 19-9: CA 19-9: 19 U/mL (ref 0–35)

## 2023-05-08 MED ORDER — SODIUM CHLORIDE 0.9% FLUSH
10.0000 mL | Freq: Two times a day (BID) | INTRAVENOUS | Status: DC
  Administered 2023-05-08 – 2023-05-12 (×8): 10 mL

## 2023-05-08 MED ORDER — SODIUM CHLORIDE 0.9% FLUSH: 10.0000 mL | INTRAVENOUS | Status: DC | PRN

## 2023-05-08 MED ORDER — SODIUM CHLORIDE 0.9 % IV SOLN: INTRAVENOUS | Status: AC

## 2023-05-08 MED ORDER — CHLORHEXIDINE GLUCONATE CLOTH 2 % EX PADS
6.0000 | MEDICATED_PAD | Freq: Every day | CUTANEOUS | Status: DC
  Administered 2023-05-08 – 2023-05-12 (×5): 6 via TOPICAL

## 2023-05-08 NOTE — Progress Notes (Signed)
PROGRESS NOTE    Gilbert [name]  WUJ:[id number]  DOB: 19-Mar-1951  DOA: 05/05/2023 PCP: Shade Flood, MD Outpatient Specialists:   Hospital course:  72 year old man with history of clear-cell cancer s/p right nephrectomy, CKD 3A, large diffuse B-cell lymphoma in remission, HTN, seizure disorder, PAD was admitted yesterday for AKI found on routine blood work.  Patient also notes that he has had unintentional weight loss of about 30 pounds over the past 3 to 4 months.   Subjective:  No new complaints today.  He says he is glad that his kidney function appears to be improving.  Patient denies shortness of breath.  Denies any change in baseline DOE over the last several months or recently.   Objective: Vitals:   05/07/23 1523 05/07/23 1954 05/08/23 0354 05/08/23 0738  BP: (!) 105/54 (!) 126/55 109/64 (!) 128/59  Pulse: 63 65 75 75  Resp: 20 18 18 17   Temp: 97.9 F (36.6 C) (!) 97.4 F (36.3 C) 97.9 F (36.6 C) 98.2 F (36.8 C)  TempSrc: Oral Oral Oral Oral  SpO2: 96% 100% 100% 95%    Intake/Output Summary (Last 24 hours) at 05/08/2023 1648 Last data filed at 05/08/2023 1400 Gross per 24 hour  Intake 1500 ml  Output 500 ml  Net 1000 ml   There were no vitals filed for this visit.   Exam:  General: Patient sleeping comfortably at 10 degrees in NAD, arousable by voice alone Eyes: sclera anicteric, conjuctiva mild injection bilaterally CVS: S1-S2, regular  Respiratory:  decreased air entry bilaterally secondary to decreased inspiratory effort, rales at bases  GI: NABS, soft, NT  LE: Warm and well-perfused Neuro: A/O x 3,  grossly nonfocal.   Data Reviewed:  Basic Metabolic Panel: Recent Labs  Lab 05/05/23 1113 05/05/23 2326 05/06/23 0032 05/06/23 0616 05/06/23 0737 05/07/23 0528 05/08/23 0657  NA 135 138  --   --  135 139 141  K 5.7* 5.8*  --   --  4.9 4.4 4.3  CL 110 110  --   --  110 113* 116*  CO2 17* 13*  --   --  15* 16* 16*  GLUCOSE 90 92  --    --  78 85 81  BUN 62* 66*  --   --  62* 54* 38*  CREATININE 4.31* 4.43*  --   --  4.15* 3.26* 2.55*  CALCIUM 9.3 9.1  --   --  8.5* 8.1* 7.9*  PHOS  --   --  4.3 4.1  --   --   --     CBC: Recent Labs  Lab 05/05/23 1113 05/05/23 2206 05/07/23 0528 05/08/23 0657  WBC 12.6* 8.8 4.9 4.8  NEUTROABS  --  5.1 3.0  --   HGB 10.0* 9.5* 8.1* 8.9*  HCT 30.9* 29.7* 25.2* 27.5*  MCV 90.1 90.5 88.7 91.1  PLT 241.0 216 141* 134*     Scheduled Meds:  carvedilol  3.125 mg Oral BID WC   clopidogrel  75 mg Oral Daily   ezetimibe  10 mg Oral Daily   famotidine  20 mg Oral Daily   heparin  5,000 Units Subcutaneous Q8H   phenytoin  300 mg Oral Daily   rosuvastatin  40 mg Oral Daily   sodium bicarbonate  650 mg Oral TID   Continuous Infusions:  sodium chloride 75 mL/hr at 05/08/23 0847     Assessment & Plan:   AKI on CKD 3B Metabolic acidosis Appreciate nephrology input AKI  thought to be prerenal in setting of Entresto use Renal function is improving on modest hydration of normal saline at 75 Continue oral bicarb per nephrology  Combined HFrEF/HFpEF HTN Echo in 11/18/2022 showed EF of 40% but with moderate concentric hypertrophy BP seems to be rebounding, is improved from yesterday However he is still off of his Entresto, hydrochlorothiazide and is on decreased carvedilol only 3.125 BID Entresto and HCTZ are being held given likely prerenal causes of AKI Will need to follow fluid status closely as we hydrate patient while holding his Sherryll Burger Will need to titrate back Entresto and optimize carvedilol as tolerated once his kidney function is normalized  Unexplained weight  Possible food insecurity On chart review, PCP notes mention that patient weighed 177 pounds on [place] 2023, subsequently noted to have weight of 164 also in [place] 2023 and was noted to be weighing 160 pounds yesterday. As per above discussion, patient Dimaria have some food insecurity, patient attributes his weight loss  to not having as much food since his food stamps were discontinued. TOC consult placed for assistance  Groundglass opacities  Possible T11 pathologic fracture Of note patient does have history of clear-cell carcinoma of the kidneys s/p nephrectomy and diffuse large B-cell lymphoma presumably in remission, previously followed by Dr. Myna Hidalgo. Chest CT yesterday shows groundglass opacities Marsch be secondary to scarring versus ongoing pneumonitis Patient denies change in baseline DOE either recently or subacutely Groundglass changes seen on CT are most likely secondary to scarring LDH is essentially normal Consider spinal MRI for better look at T11 either as an inpatient or an outpatient  CAD PVD Continue Plavix and Zetia  Seizure disorder Continue Dilantin  Anemia H&H had decreased from admission however is stable on repeat Stool Hemoccult are negative    DVT prophylaxis: Subcu heparin Code Status: Full Family Communication: None today     Studies: CT CHEST WO CONTRAST  Result Date: 05/07/2023 CLINICAL DATA:  Atelectasis or consolidation left base on the last chest series. History of non-Hodgkin's lymphoma. EXAM: CT CHEST WITHOUT CONTRAST TECHNIQUE: Multidetector CT imaging of the chest was performed following the standard protocol without IV contrast. RADIATION DOSE REDUCTION: This exam was performed according to the departmental dose-optimization program which includes automated exposure control, adjustment of the mA and/or kV according to patient size and/or use of iterative reconstruction technique. COMPARISON:  AP Lat chest 05/06/2023, AP Lat chest 06/01/2017, PET-CT 07/29/2016, and chest CT with contrast 12/10/2015. FINDINGS: Cardiovascular: The cardiac size is normal. The coronary arteries are heavily calcified. There is a stent in the left main coronary artery as before. Minimal anterior pericardial effusion unchanged. Pulmonary arteries and veins are within normal caliber  limits. There are moderate to heavy aortic calcific plaques with tortuosity, without aneurysm. Patchy calcifications in the great vessels. Likely calcific origin stenosis right subclavian artery. There is lipomatous hypertrophy of the interatrial septum, interval increased. Clinical correlation suggested as this can be arrhythmogenic. Old right chest port with IJ approach catheter terminating in the distal SVC. Mediastinum/Nodes: There previously was a mildly prominent posterior right mid hilar lymph node which is no longer seen. Without contrast no enlarged intrathoracic lymph nodes are identified. There is a 1.5 cm hypodense nodule in the mid right lobe of the thyroid gland, previously 1 cm. Nonemergent ultrasound follow-up is recommended. Reference: J Am Coll Radiol. 2015 Feb;12(2): 143-50. Axillary spaces are clear. The trachea is clear. No esophageal thickening or hiatal hernia. Lungs/Pleura: The lungs are moderately emphysematous with centrilobular changes predominating and  there is diffuse bronchial thickening without visible bronchial plugging. Interval new trace symmetric pleural layering effusions. No pneumothorax. There are mild posterior atelectatic changes in the lungs. 3 mm chronic left lower lobe fissural nodule on 5:91 and a 3 mm chronic left lower lobe lateral basal nodule on 5:127 are both unchanged. There are minimal subpleural ground-glass interstitial changes laterally in the right middle and lower lobes. This was not seen previously and could be due to interval new post pneumonic scarring or a low-grade active pneumonitis. Rest of the lungs are generally clear.  No pneumothorax. Upper Abdomen: No acute abnormality. Abdominal aortic and visceral branch vessel atherosclerosis. Musculoskeletal: Bilateral dendritic gynecomastia with no other significant chest wall findings. There is chronic sclerotic change in the left glenoid process and posterolateral right sixth rib, as well as patchy chronic  sclerosis in the T11 vertebral body with a mild chronic upper plate compression fracture. No new or acute skeletal abnormality is seen. IMPRESSION: 1. Emphysema with diffuse bronchial thickening. 2. Subpleural ground-glass interstitial changes laterally in the right middle and lower lobe. This could be due to interval post pneumonic scarring new from 2017 or a low-grade active pneumonitis. 3. Interval new trace pleural effusions. 4. Aortic and coronary artery atherosclerosis. 5. Lipomatous hypertrophy of the interatrial septum increased from 2017. 6. 1.5 cm right thyroid nodule. Nonemergent ultrasound follow-up is recommended. 7. Chronic sclerotic change in the left glenoid process, posterolateral right sixth rib, and T11 vertebral body with a mild chronic upper plate D32 compression fracture. No new or acute skeletal abnormality. 8. Bilateral dendritic gynecomastia. 9. No appreciable adenopathy without contrast. 10. Two stable 3 mm left lung nodules.  No new nodules. 11. For lung cancer screening, adhere to Lung-RADS guidelines. Aortic Atherosclerosis (ICD10-I70.0). Electronically Signed   By: Almira Bar M.D.   On: 05/07/2023 22:50    Principal Problem:   Acute kidney injury superimposed on chronic kidney disease (HCC) Active Problems:   Renal cancer (HCC)   Seizures (HCC)   Essential hypertension   PAD (peripheral artery disease) (HCC)   Diffuse large cell non-Hodgkin's lymphoma (HCC)   Chronic systolic heart failure (HCC)   Hyperkalemia   Metabolic acidosis     Dulcinea Kinser Tublu Brandace Cargle, [facility]  If 7PM-7AM, please contact night-coverage [email or web address]   LOS: 2 days

## 2023-05-08 NOTE — Plan of Care (Signed)

## 2023-05-08 NOTE — Plan of Care (Signed)
  Problem: Education: Goal: Knowledge of General Education information will improve Description: Including pain rating scale, medication(s)/side effects and non-pharmacologic comfort measures Outcome: Progressing   Problem: Health Behavior/Discharge Planning: Goal: Ability to manage health-related needs will improve Outcome: Progressing   Problem: Clinical Measurements: Goal: Will remain free from infection Outcome: Progressing Goal: Diagnostic test results will improve Outcome: Progressing   Problem: Nutrition: Goal: Adequate nutrition will be maintained Outcome: Progressing   Problem: Coping: Goal: Level of anxiety will decrease Outcome: Progressing   Problem: Elimination: Goal: Will not experience complications related to urinary retention Outcome: Progressing   Problem: Pain Managment: Goal: General experience of comfort will improve Outcome: Progressing   Problem: Safety: Goal: Ability to remain free from injury will improve Outcome: Progressing

## 2023-05-08 NOTE — Progress Notes (Addendum)
[facility] Progress Note  Name: Gilbert [name] MRN: [id number] DOB: 10-18-1950  Chief Complaint:  Sent with abnormal labs (AKI) and itching   Subjective:  Suspect not all ins/outs captured.  He had 350 ml uop charted over 10/5.   He has been on normal saline at 75 ml/hr.  He thinks his IV is about to come out - RN is at bedside to assess this and to get a new dressing for same.   Review of systems:   Denies nausea or vomiting  Denies shortness of breath or chest pain Still itching - last night was rough   Intake/Output Summary (Last 24 hours) at 05/08/2023 0815 Last data filed at 05/07/2023 1800 Gross per 24 hour  Intake 940 ml  Output 200 ml  Net 740 ml    Vitals:  Vitals:   05/07/23 1523 05/07/23 1954 05/08/23 0354 05/08/23 0738  BP: (!) 105/54 (!) 126/55 109/64 (!) 128/59  Pulse: 63 65 75 75  Resp: 20 18 18 17   Temp: 97.9 F (36.6 C) (!) 97.4 F (36.3 C) 97.9 F (36.6 C) 98.2 F (36.8 C)  TempSrc: Oral Oral Oral Oral  SpO2: 96% 100% 100% 95%     Physical Exam:   General adult male in bed in no acute distress HEENT normocephalic atraumatic extraocular movements intact sclera anicteric Neck supple trachea midline Lungs clear to auscultation bilaterally normal work of breathing at rest on room air Heart S1S2 no rub Abdomen soft nontender nondistended Extremities no edema  Skin scattered excoriations and healing lesions/scabbed lesions and some bleeding lesions on his arms, legs, trunk.  There is blood on his gown in multiple places from scratching the lesions Psych normal mood and affect Neuro alert and oriented x3 provides hx and follows commands   Medications reviewed   Labs:     Latest Ref Rng & Units 05/07/2023    5:28 AM 05/06/2023    7:37 AM 05/05/2023   11:26 PM  BMP  Glucose 70 - 99 mg/dL 85  78  92   BUN 8 - 23 mg/dL 54  62  66   Creatinine 0.61 - 1.24 mg/dL 0.86  5.78  4.69   Sodium 135 - 145 mmol/L 139  135  138   Potassium 3.5 - 5.1  mmol/L 4.4  4.9  5.8   Chloride 98 - 111 mmol/L 113  110  110   CO2 22 - 32 mmol/L 16  15  13    Calcium 8.9 - 10.3 mg/dL 8.1  8.5  9.1      Assessment/Plan:   # AKI  - secondary to pre-renal and ischemic insults.  He likely had hypotension and a decreased requirement for blood pressure medications in the setting of unintentional weight loss.  Note hx of nephrectomy and therefore decreased renal reserve - Improving with supportive care  - hold home entresto and lasix - gently hydrate - continue NS at 75 ml/hr for an additional 24 hours and will need to reassess tomorrow      # CKD stage 3 - baseline Cr 1.5  # Solitary kidney  - s/p right nephrectomy in 2015 with Dr. Berneice Heinrich   # Ischemic CM  - note EF 40% by 11/2022 echocardiogram - as above hold entresto and lasix - gently hydrate as above - would remain off of entresto for now and [place] need to reduce dose of lasix    # HTN  - holding entresto and lasix  - I have reduced  coreg to now 3.125 mg BID - agents are directed at cardiomyopathy  - his anti-hypertensive requirement has likely changed with his weight loss   # Anemia of CKD  - normocytic anemia  - his iron is replete - hopeful for eventual improvement with improving AKI if his AKI is contributing.  His unintentional weight loss is concerning for occult malignancy so would first exclude this prior to giving ESA    # Metabolic acidosis  - due to AKI and also is getting non-bicarb-containing fluids - on oral bicarbonate which Almas be temporary    # Generalized pruritus  - phos is acceptable so this would not be the explanation - optimize renal function as above to see if this improves with improving renal failure - would exclude malignancy as below - I have ordered aquaphor      # Unintentional weight loss  - Concerning for malignancy  - would ensure he has had appropriate cancer screening  - note hx of renal mass/malignancy and nephrectomy for the same    Please  continue inpatient monitoring   Estanislado Emms, MD 05/08/2023 8:28 AM

## 2023-05-09 DIAGNOSIS — E440 Moderate protein-calorie malnutrition: Secondary | ICD-10-CM | POA: Insufficient documentation

## 2023-05-09 DIAGNOSIS — N189 Chronic kidney disease, unspecified: Secondary | ICD-10-CM | POA: Diagnosis not present

## 2023-05-09 DIAGNOSIS — N179 Acute kidney failure, unspecified: Secondary | ICD-10-CM | POA: Diagnosis not present

## 2023-05-09 LAB — CBC
HCT: 25.6 % — ABNORMAL LOW (ref 39.0–52.0)
Hemoglobin: 8.2 g/dL — ABNORMAL LOW (ref 13.0–17.0)
MCH: 29.2 pg (ref 26.0–34.0)
MCHC: 32 g/dL (ref 30.0–36.0)
MCV: 91.1 fL (ref 80.0–100.0)
Platelets: 116 10*3/uL — ABNORMAL LOW (ref 150–400)
RBC: 2.81 MIL/uL — ABNORMAL LOW (ref 4.22–5.81)
RDW: 13.9 % (ref 11.5–15.5)
WBC: 5.8 10*3/uL (ref 4.0–10.5)
nRBC: 0 % (ref 0.0–0.2)

## 2023-05-09 LAB — HEPATITIS B SURFACE ANTIGEN: Hepatitis B Surface Ag: NONREACTIVE

## 2023-05-09 LAB — BASIC METABOLIC PANEL
Anion gap: 6 (ref 5–15)
BUN: 28 mg/dL — ABNORMAL HIGH (ref 8–23)
CO2: 18 mmol/L — ABNORMAL LOW (ref 22–32)
Calcium: 7.8 mg/dL — ABNORMAL LOW (ref 8.9–10.3)
Chloride: 118 mmol/L — ABNORMAL HIGH (ref 98–111)
Creatinine, Ser: 2.07 mg/dL — ABNORMAL HIGH (ref 0.61–1.24)
GFR, Estimated: 33 mL/min — ABNORMAL LOW (ref >60)
Glucose, Bld: 80 mg/dL (ref 70–99)
Potassium: 4.4 mmol/L (ref 3.5–5.1)
Sodium: 142 mmol/L (ref 135–145)

## 2023-05-09 LAB — HEPATITIS B CORE ANTIBODY, TOTAL: Hep B Core Total Ab: NONREACTIVE

## 2023-05-09 LAB — TSH: TSH: 0.821 u[IU]/mL (ref 0.350–4.500)

## 2023-05-09 LAB — GLOMERULAR BASEMENT MEMBRANE ANTIBODIES: GBM Ab: <0.2 U (ref 0.0–0.9)

## 2023-05-09 LAB — SEDIMENTATION RATE: Sed Rate: 10 mm/h (ref 0–16)

## 2023-05-09 LAB — HEPATITIS C ANTIBODY: HCV Ab: NONREACTIVE

## 2023-05-09 MED ORDER — METOPROLOL TARTRATE 5 MG/5ML IV SOLN: 5.0000 mg | INTRAVENOUS | Status: DC | PRN

## 2023-05-09 MED ORDER — HYDRALAZINE HCL 20 MG/ML IJ SOLN: 10.0000 mg | INTRAMUSCULAR | Status: DC | PRN

## 2023-05-09 MED ORDER — IPRATROPIUM-ALBUTEROL 0.5-2.5 (3) MG/3ML IN SOLN: 3.0000 mL | RESPIRATORY_TRACT | Status: DC | PRN

## 2023-05-09 MED ORDER — ENSURE ENLIVE PO LIQD
237.0000 mL | Freq: Two times a day (BID) | ORAL | Status: DC
  Administered 2023-05-09 – 2023-05-12 (×5): 237 mL via ORAL

## 2023-05-09 MED ORDER — SENNOSIDES-DOCUSATE SODIUM 8.6-50 MG PO TABS: 1.0000 | ORAL_TABLET | Freq: Every evening | ORAL | Status: DC | PRN

## 2023-05-09 MED ORDER — TRAZODONE HCL 50 MG PO TABS: 50.0000 mg | ORAL_TABLET | Freq: Every evening | ORAL | Status: DC | PRN

## 2023-05-09 MED ORDER — ONDANSETRON HCL 4 MG/2ML IJ SOLN: 4.0000 mg | Freq: Four times a day (QID) | INTRAMUSCULAR | Status: DC | PRN

## 2023-05-09 NOTE — Evaluation (Signed)
Occupational Therapy Evaluation Patient Details Name: Gilbert [name] MRN: [id number] DOB: Mar 03, 1951 Today's Date: 05/09/2023   History of Present Illness 72 year old male with past medical history RCC (right nephrectomy), CKD stage IIIa, NHL in remission, cardiomyopathy, HTN,, PAD, seizures. Patient reports for approximately last month he has been itching and has gotten gradually worse and has developed these bumps all over.  Pt called his PCP, routine blood work revealed elevated creatinine.  Patient was directed to go to the ER. Pt endorses a 30 pound weight loss over the last month   Clinical Impression   Pt is at baseline Ind - Mod I level of function with ADLs and ADL mobility using RW and HHA. Pt reports that PTA he lived with his children and was Ind with ADLs/selfcare and did not use an AD for mobility but does state that he has a RW. All education completed and no further acute OT services are indicated at this time. OT will sign off       If plan is discharge home, recommend the following: Assist for transportation;Assistance with cooking/housework    Functional Status Assessment  Patient has not had a recent decline in their functional status  Equipment Recommendations  None recommended by OT    Recommendations for Other Services       Precautions / Restrictions Precautions Precautions: None Restrictions Weight Bearing Restrictions: No      Mobility Bed Mobility               General bed mobility comments: pt in recliner upon arrival    Transfers Overall transfer level: Modified independent Equipment used: Rolling walker (2 wheels)                      Balance Overall balance assessment: Mild deficits observed, not formally tested                                         ADL either performed or assessed with clinical judgement   ADL Overall ADL's : Independent;Modified independent;At baseline                                              Vision Baseline Vision/History: 1 Wears glasses Ability to See in Adequate Light: 0 Adequate Patient Visual Report: No change from baseline       Perception         Praxis         Pertinent Vitals/Pain Pain Assessment Pain Assessment: No/denies pain     Extremity/Trunk Assessment Upper Extremity Assessment Upper Extremity Assessment: Overall WFL for tasks assessed   Lower Extremity Assessment Lower Extremity Assessment: Defer to PT evaluation       Communication Communication Communication: No apparent difficulties   Cognition Arousal: Alert Behavior During Therapy: WFL for tasks assessed/performed, Flat affect Overall Cognitive Status: Within Functional Limits for tasks assessed                                       General Comments       Exercises     Shoulder Instructions      Home Living Family/patient expects to be discharged to:: Private residence  Living Arrangements: Children Available Help at Discharge: Family;Available PRN/intermittently Type of Home: House Home Access: Stairs to enter [employer] of Steps: 3 Entrance Stairs-Rails: Right;Left;Can reach both Home Layout: One level     Bathroom Shower/Tub: Chief Strategy Officer: Standard     Home Equipment: Agricultural consultant (2 wheels)          Prior Functioning/Environment Prior Level of Function : Independent/Modified Independent             Mobility Comments: Pt reports that hse was Ind with mobility and did not use RW ADLs Comments: Pt reports that he was INd with ADLs/selfcare        OT Problem List: Decreased activity tolerance      OT Treatment/Interventions:      OT Goals(Current goals can be found in the care plan section) Acute Rehab OT Goals Patient Stated Goal: go home  OT Frequency:      Co-evaluation              AM-PAC OT "6 Clicks" Daily Activity     Outcome Measure Help from another  person eating meals?: None Help from another person taking care of personal grooming?: None Help from another person toileting, which includes using toliet, bedpan, or urinal?: None Help from another person bathing (including washing, rinsing, drying)?: None Help from another person to put on and taking off regular upper body clothing?: None Help from another person to put on and taking off regular lower body clothing?: None 6 Click Score: 24   End of Session Equipment Utilized During Treatment: Gait belt;Rolling walker (2 wheels)  Activity Tolerance: Patient tolerated treatment well Patient left: in chair;with call bell/phone within reach;with chair alarm set  OT Visit Diagnosis: Muscle weakness (generalized) (M62.81)                Time: 4696-2952 OT Time Calculation (min): 20 min Charges:  OT General Charges $OT Visit: 1 Visit OT Evaluation $OT Eval Low Complexity: 1 Low    Galen Manila 05/09/2023, 11:41 AM

## 2023-05-09 NOTE — Progress Notes (Addendum)
Initial Nutrition Assessment  DOCUMENTATION CODES:   Non-severe (moderate) malnutrition in context of chronic illness  INTERVENTION:  Ensure BID Nutritional dense food education provided.   NUTRITION DIAGNOSIS:   Moderate Malnutrition related to chronic illness as evidenced by moderate fat depletion, moderate muscle depletion.    GOAL:   Patient will meet greater than or equal to 90% of their needs    MONITOR:   PO intake, Supplement acceptance, Labs, Weight trends, I & O's  REASON FOR ASSESSMENT:   Consult Assessment of nutrition requirement/status, Diet education  ASSESSMENT:  72 year old male with past medical history RCC (right nephrectomy), CKD stage IIIa, NHL in remission, cardiomyopathy, HTN,, PAD, seizures, Large diffuse B-cell lymphoma in remission. . Admitted for AKI, With self reported unintentional weight loss over last 3-4 months. Review of EMR revealed no significant weight or oral intake decline. Upon first visit pt said he had to go to the restroom and request walker to get there. RD provided as he trying to stand he stated man I am going right now. RN entered about this time and took over. 2nd visit Pt stated good appetite with no changes noted. He did confirm weight loss although was not abel to tell me usual weight. Per recorded weight ~ 4% loss since July.  Discussed supplements. And ways to achieve more nutritional dense foods when back home.  Admit weight: 72.9 kg Current weight: 72.9 kg  Weight history: 05/05/23 72.9 kg  02/24/23 76.3 kg  10/06/22 79.7 kg  04/08/22 76.7 kg   Average Meal Intake:  100% intake x 1 recorded meals  Nutritionally Relevant Medications: Scheduled Meds:  carvedilol  3.125 mg Oral BID WC   clopidogrel  75 mg Oral Daily   sodium bicarbonate  650 mg Oral TID   sodium chloride flush  10-40 mL Intracatheter Q12H    Continuous Infusions: PRN Meds:.acetaminophen **OR** acetaminophen, diphenhydrAMINE-zinc acetate,  hydrALAZINE, hydrOXYzine, ipratropium-albuterol, lactulose, metoprolol tartrate, mineral oil-hydrophilic petrolatum, ondansetron (ZOFRAN) IV, senna-docusate, sodium chloride flush, traZODone  Labs Reviewed    NUTRITION - FOCUSED PHYSICAL EXAM:  Flowsheet Row Most Recent Value  Orbital Region Moderate depletion  Upper Arm Region Moderate depletion  Thoracic and Lumbar Region Moderate depletion  Buccal Region Mild depletion  Temple Region Severe depletion  Clavicle Bone Region Moderate depletion  Clavicle and Acromion Bone Region Moderate depletion  Scapular Bone Region Moderate depletion  Dorsal Hand Moderate depletion  Patellar Region Moderate depletion  Anterior Thigh Region Moderate depletion  Posterior Calf Region Moderate depletion  Edema (RD Assessment) None  Hair Reviewed  Eyes Reviewed  Mouth Reviewed  Skin Reviewed  Nails Reviewed       Diet Order:   Diet Order             Diet renal with fluid restriction Fluid restriction: 1800 mL Fluid; Room service appropriate? Yes; Fluid consistency: Thin  Diet effective now                   EDUCATION NEEDS:   Education needs have been addressed  Skin:  Skin Assessment: Reviewed RN Assessment  Last BM:  10/5  Height:   Ht Readings from Last 1 Encounters:  02/24/23 5\' 7"  (1.702 m)    Weight:   Wt Readings from Last 1 Encounters:  05/05/23 72.9 kg    Ideal Body Weight:     BMI:  There is no height or weight on file to calculate BMI.  Estimated Nutritional Needs:   Kcal:  2100-2500  Protein:  95-110  Fluid:  1800-2000    Jamelle Haring RDN, LDN Clinical Dietitian  RDN pager # available on Amion

## 2023-05-09 NOTE — Care Management Important Message (Signed)
Important Message  Patient Details  Name: Gilbert [name] MRN: [id number] Date of Birth: May 09, 1951   Important Message Given:  Yes - Medicare IM     Dorena Bodo 05/09/2023, 2:03 PM

## 2023-05-09 NOTE — Evaluation (Signed)
Physical Therapy Evaluation Patient Details Name: Gilbert [name] MRN: [id number] DOB: [birth year] Today's Date: 05/09/2023  History of Present Illness  72 y.o. male presents to [facility] hospital on 05/05/2023 with AKI. PMH includes clear-cell cancer s/p right nephrectomy, CKD III, B-cell lymphoma, HTN, seizure, PAD.  Clinical Impression  Pt presents to PT with deficits in functional mobility, strength, power, endurance, gait, balance, cardiopulmonary function. Pt is incontinent of stool during session, continues to have diarrhea, and has not been eating as he does not like any of the food here at the hospital. Pt appears generally weak, requesting UE support of RW to mobilize in the room. Pt fatigues quickly, walking to the bathroom and back, although pt's daughter reports endurance deficits are typical. Pt will benefit from frequent mobilization in an effort to improve strength and reduce falls risk. PT attempts to encourage the pt to eat in an effort to provide nutrition for improvement in strength and mobility.        If plan is discharge home, recommend the following: A little help with walking and/or transfers;A little help with bathing/dressing/bathroom;Assistance with cooking/housework;Assist for transportation;Help with stairs or ramp for entrance   Can travel by private vehicle        Equipment Recommendations BSC/3in1  Recommendations for Other Services       Functional Status Assessment Patient has had a recent decline in their functional status and demonstrates the ability to make significant improvements in function in a reasonable and predictable amount of time.     Precautions / Restrictions Precautions Precautions: Fall Precaution Comments: enteric Restrictions Weight Bearing Restrictions: No      Mobility  Bed Mobility Overal bed mobility: Needs Assistance Bed Mobility: Supine to Sit, Sit to Supine     Supine to sit: Supervision Sit to supine: Supervision         Transfers Overall transfer level: Needs assistance Equipment used: Rolling walker (2 wheels) Transfers: Sit to/from Stand Sit to Stand: Supervision                Ambulation/Gait Ambulation/Gait assistance: Contact guard assist Gait Distance (Feet): 15 Feet (15' x 2 to and from bathroom) Assistive device: Rolling walker (2 wheels) Gait Pattern/deviations: Step-through pattern, Drifts right/left Gait velocity: reduced Gait velocity interpretation: <1.8 ft/sec, indicate of risk for recurrent falls   General Gait Details: increased lateral sway  Stairs            Wheelchair Mobility     Tilt Bed    Modified Rankin (Stroke Patients Only)       Balance Overall balance assessment: Needs assistance Sitting-balance support: No upper extremity supported, Feet supported Sitting balance-Leahy Scale: Good     Standing balance support: Single extremity supported, Reliant on assistive device for balance Standing balance-Leahy Scale: Poor                               Pertinent Vitals/Pain Pain Assessment Pain Assessment: No/denies pain    Home Living Family/patient expects to be discharged to:: Private residence Living Arrangements: Children Available Help at Discharge: Family Type of Home: House Home Access: Stairs to enter Entrance Stairs-Rails: Can reach both Entrance Stairs-Number of Steps: 4   Home Layout: One level Home Equipment: Agricultural consultant (2 wheels);Cane - single point      Prior Function Prior Level of Function : Needs assist             Mobility Comments: ambulates with  intermittent support of RW vs SPC for short household distances ADLs Comments: reports that daughter assists with some tasks     Extremity/Trunk Assessment   Upper Extremity Assessment Upper Extremity Assessment: Generalized weakness    Lower Extremity Assessment Lower Extremity Assessment: Generalized weakness    Cervical / Trunk  Assessment Cervical / Trunk Assessment: Kyphotic  Communication   Communication Communication: No apparent difficulties Cueing Techniques: Verbal cues  Cognition Arousal: Alert Behavior During Therapy: Impulsive Overall Cognitive Status: Within Functional Limits for tasks assessed                                          General Comments General comments (skin integrity, edema, etc.): pt incontinent of stool once standing, continues to have diarrhea. Pt fatigues quickly due to DOE, this is baseline per daughter    Exercises     Assessment/Plan    PT Assessment Patient needs continued PT services  PT Problem List Decreased strength;Decreased balance;Decreased activity tolerance;Decreased mobility;Decreased knowledge of use of DME;Decreased safety awareness;Decreased knowledge of precautions;Cardiopulmonary status limiting activity       PT Treatment Interventions Gait training;DME instruction;Stair training;Functional mobility training;Therapeutic activities;Therapeutic exercise;Balance training;Neuromuscular re-education;Cognitive remediation;Patient/family education    PT Goals (Current goals can be found in the Care Plan section)  Acute Rehab PT Goals Patient Stated Goal: to improve strength and activity tolerance PT Goal Formulation: With patient/family Time For Goal Achievement: 05/23/23 Potential to Achieve Goals: Fair    Frequency Min 1X/week     Co-evaluation               AM-PAC PT "6 Clicks" Mobility  Outcome Measure Help needed turning from your back to your side while in a flat bed without using bedrails?: A Little Help needed moving from lying on your back to sitting on the side of a flat bed without using bedrails?: A Little Help needed moving to and from a bed to a chair (including a wheelchair)?: A Little Help needed standing up from a chair using your arms (e.g., wheelchair or bedside chair)?: A Little Help needed to walk in  hospital room?: A Little Help needed climbing 3-5 steps with a railing? : A Lot 6 Click Score: 17    End of Session   Activity Tolerance: Patient limited by fatigue Patient left: in bed;with call bell/phone within reach;with family/visitor present Nurse Communication: Mobility status PT Visit Diagnosis: Other abnormalities of gait and mobility (R26.89);Muscle weakness (generalized) (M62.81)    Time: 5956-3875 PT Time Calculation (min) (ACUTE ONLY): 24 min   Charges:   PT Evaluation $PT Eval Low Complexity: 1 Low   PT General Charges $$ ACUTE PT VISIT: 1 Visit         Arlyss Gandy, PT, DPT Acute Rehabilitation Office [phone]   Arlyss Gandy 05/09/2023, 4:25 PM

## 2023-05-09 NOTE — Progress Notes (Signed)
[facility] Progress Note   Assessment/ Plan:   # AKI  - secondary to pre-renal and ischemic insults.  He likely had hypotension and a decreased requirement for blood pressure medications in the setting of unintentional weight loss.  Note hx of nephrectomy and therefore decreased renal reserve - Improving with supportive care  - hold home entresto and lasix - s/p IVFs     # Solitary kidney  - s/p right nephrectomy in 2015 with Dr. Berneice Heinrich   # Ischemic CM  - note EF 40% by 11/2022 echocardiogram - as above hold entresto and lasix - gently hydrate as above - would remain off of entresto for now and Kerins need to reduce dose of lasix    # HTN  - holding entresto and lasix  - I have reduced coreg to now 3.125 mg BID - agents are directed at cardiomyopathy  - his anti-hypertensive requirement has likely changed with his weight loss   # Skin rash /pruritis - I have ordered a serological workup- including ANA, complements, ANCA (already pending) anti-GBM (already pending), ENA, complement, RF - LDH not that high - phos ok - ? Is this paraneoplastic with B cell lymphoma?  Has worsening anemia and also weight loss.  Menser be worth it to consider skin biopsy   # Unintentional weight loss  - Concerning for malignancy  - note hx of renal mass/malignancy and nephrectomy for the same   # Diarrhea: - GI panel pending  Subjective:    Seen in room.  Cr better.  Reports a really itchy rash all over his body- says they look like little "boils" and then itch.  Scratching.     Objective:   BP 115/60 (BP Location: Right Arm)   Pulse 83   Temp 98.8 F (37.1 C) (Oral)   Resp 14   SpO2 98%   Physical Exam: Gen:NAD, sitting in bed CVS: RRR Resp: clear Abd: soft Ext: no LE edema SKIN: allover excoriations/ areas of scabbing- no fresh lesions to look at  Labs: BMET Recent Labs  Lab 05/05/23 1113 05/05/23 2326 05/06/23 0032 05/06/23 1610 05/06/23 0737 05/07/23 0528  05/08/23 0657 05/09/23 0500  NA 135 138  --   --  135 139 141 142  K 5.7* 5.8*  --   --  4.9 4.4 4.3 4.4  CL 110 110  --   --  110 113* 116* 118*  CO2 17* 13*  --   --  15* 16* 16* 18*  GLUCOSE 90 92  --   --  78 85 81 80  BUN 62* 66*  --   --  62* 54* 38* 28*  CREATININE 4.31* 4.43*  --   --  4.15* 3.26* 2.55* 2.07*  CALCIUM 9.3 9.1  --   --  8.5* 8.1* 7.9* 7.8*  PHOS  --   --  4.3 4.1  --   --   --   --    CBC Recent Labs  Lab 05/05/23 2206 05/07/23 0528 05/08/23 0657 05/09/23 0750  WBC 8.8 4.9 4.8 5.8  NEUTROABS 5.1 3.0  --   --   HGB 9.5* 8.1* 8.9* 8.2*  HCT 29.7* 25.2* 27.5* 25.6*  MCV 90.5 88.7 91.1 91.1  PLT 216 141* 134* 116*      Medications:     carvedilol  3.125 mg Oral BID WC   Chlorhexidine Gluconate Cloth  6 each Topical Daily   clopidogrel  75 mg Oral Daily   ezetimibe  10 mg Oral Daily   famotidine  20 mg Oral Daily   feeding supplement  237 mL Oral BID BM   heparin  5,000 Units Subcutaneous Q8H   phenytoin  300 mg Oral Daily   rosuvastatin  40 mg Oral Daily   sodium bicarbonate  650 mg Oral TID   sodium chloride flush  10-40 mL Intracatheter Q12H     Bufford Buttner, MD 05/09/2023, 2:55 PM

## 2023-05-09 NOTE — Progress Notes (Signed)
PROGRESS NOTE    Gilbert [name]  QVZ:[id number] DOB: 1951-02-20 DOA: 05/05/2023 PCP: Gilbert Flood, MD       Hospital course:   72 year old man with history of clear-cell cancer s/p right nephrectomy, CKD 3A, large diffuse B-cell lymphoma in remission, HTN, seizure disorder, PAD was admitted yesterday for AKI found on routine blood work.  Patient also notes that he has had unintentional weight loss of about 30 pounds over the past 3 to 4 months.  Patient was found to be in AKI, nephrology consulted, holding off on nephrotoxic drugs.     Assessment & Plan:   AKI on CKD 3B Metabolic acidosis Appears to be prerenal in nature.  Baseline creatinine around 1.5, admission creatinine 4.3 peaked at 4.43.  Slowly improving and nephrology is following.   Combined HFrEF/HFpEF HTN Echo in 11/18/2022 showed EF of 40% but with moderate concentric hypertrophy.  Currently holding off on nephrotoxic drugs including Entresto, HCTZ.  Continue Coreg 3.125 mg twice daily.  Gentle hydration   Unexplained weight  Possible food insecurity Over the last 6 months patient has lost about 20 pounds.  Concerns of some food insecurity as.  Will discontinue.  TOC consulted.  Will also consult dietitian.  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  Followed with Dr. Myna [name].  Should follow-up back again with Dr. Myna [name] as outpatient.  CAD and peripheral vascular disease Continue Plavix and Zetia   Seizure disorder Continue Dilantin   Anemia of chronic disease Baseline hemoglobin around 10, currently stable without any obvious evidence of bleeding   DVT prophylaxis: Subcu heparin Code Status: Full Family Communication: None today             Subjective:  Generally  still feeling weak.  No new complaints at this time.  Examination:  General exam: Appears calm and comfortable  Respiratory system: Clear to auscultation. Respiratory effort normal. Cardiovascular system: S1 & S2 heard, RRR. No JVD, murmurs, rubs, gallops or clicks. No pedal edema. Gastrointestinal system: Abdomen is nondistended, soft and nontender. No organomegaly or masses felt. Normal bowel sounds heard. Central nervous system: Alert and oriented. No focal neurological deficits. Extremities: Symmetric 5 x 5 power. Skin: No rashes, lesions or ulcers Psychiatry: Judgement and insight appear normal. Mood & affect appropriate.       Diet Orders (From admission, onward)     Start     Ordered   05/06/23 0557  Diet renal with fluid restriction Fluid restriction: 1800 mL Fluid; Room service appropriate? Yes; Fluid consistency: Thin  Diet effective now       Question Answer Comment  Fluid restriction: 1800 mL Fluid   Room service appropriate? Yes   Fluid consistency: Thin      05/06/23 0557            Objective: Vitals:   05/08/23 1659 05/08/23 2122 05/09/23 0407 05/09/23 0734  BP: (!) 114/59 127/66 (!) 122/49 115/60  Pulse: 73 78 83 83  Resp: 18 18 18 14   Temp:  98.1 F (36.7 C) 98 F (36.7 C) 98.8 F (37.1 C)  TempSrc:  Oral  Oral  SpO2: 98% 99% 100% 98%    Intake/Output Summary (Last 24 hours) at 05/09/2023 1247 Last data filed at 05/09/2023 1113 Gross per 24 hour  Intake 2173.93 ml  Output  575 ml  Net 1598.93 ml   There were no vitals filed for this visit.  Scheduled Meds:  carvedilol  3.125 mg Oral BID WC   Chlorhexidine Gluconate Cloth  6 each Topical Daily   clopidogrel  75 mg Oral Daily   ezetimibe  10 mg Oral Daily   famotidine  20 mg Oral Daily   feeding supplement  237 mL Oral BID BM   heparin  5,000 Units Subcutaneous Q8H   phenytoin  300 mg Oral Daily   rosuvastatin  40 mg Oral Daily   sodium bicarbonate  650 mg Oral TID   sodium chloride  flush  10-40 mL Intracatheter Q12H   Continuous Infusions:  Nutritional status Signs/Symptoms: moderate fat depletion, moderate muscle depletion Interventions: Ensure Enlive (each supplement provides 350kcal and 20 grams of protein) There is no height or weight on file to calculate BMI.  Data Reviewed:   CBC: Recent Labs  Lab 05/05/23 1113 05/05/23 2206 05/07/23 0528 05/08/23 0657 05/09/23 0750  WBC 12.6* 8.8 4.9 4.8 5.8  NEUTROABS  --  5.1 3.0  --   --   HGB 10.0* 9.5* 8.1* 8.9* 8.2*  HCT 30.9* 29.7* 25.2* 27.5* 25.6*  MCV 90.1 90.5 88.7 91.1 91.1  PLT 241.0 216 141* 134* 116*   Basic Metabolic Panel: Recent Labs  Lab 05/05/23 2326 05/06/23 0032 05/06/23 0616 05/06/23 0737 05/07/23 0528 05/08/23 0657 05/09/23 0500  NA 138  --   --  135 139 141 142  K 5.8*  --   --  4.9 4.4 4.3 4.4  CL 110  --   --  110 113* 116* 118*  CO2 13*  --   --  15* 16* 16* 18*  GLUCOSE 92  --   --  78 85 81 80  BUN 66*  --   --  62* 54* 38* 28*  CREATININE 4.43*  --   --  4.15* 3.26* 2.55* 2.07*  CALCIUM 9.1  --   --  8.5* 8.1* 7.9* 7.8*  PHOS  --  4.3 4.1  --   --   --   --    GFR: Estimated Creatinine Clearance: 30.2 mL/min (A) (by C-G formula based on SCr of 2.07 mg/dL (H)). Liver Function Tests: Recent Labs  Lab 05/05/23 1113 05/05/23 2326 05/08/23 0657  AST 22 23 23   ALT 13 16 17   ALKPHOS 147* 135* 110  BILITOT 0.4 0.2* 0.3  PROT 6.6 5.9* 5.0*  ALBUMIN 3.5 2.8* 2.2*   No results for input(s): "LIPASE", "AMYLASE" in the last 168 hours. No results for input(s): "AMMONIA" in the last 168 hours. Coagulation Profile: No results for input(s): "INR", "PROTIME" in the last 168 hours. Cardiac Enzymes: No results for input(s): "CKTOTAL", "CKMB", "CKMBINDEX", "TROPONINI" in the last 168 hours. BNP (last 3 results) No results for input(s): "PROBNP" in the last 8760 hours. HbA1C: No results for input(s): "HGBA1C" in the last 72 hours. CBG: No results for input(s): "GLUCAP" in  the last 168 hours. Lipid Profile: No results for input(s): "CHOL", "HDL", "LDLCALC", "TRIG", "CHOLHDL", "LDLDIRECT" in the last 72 hours. Thyroid Function Tests: Recent Labs    05/09/23 0750  TSH 0.821   Anemia Panel: No results for input(s): "VITAMINB12", "FOLATE", "FERRITIN", "TIBC", "IRON", "RETICCTPCT" in the last 72 hours. Sepsis Labs: No results for input(s): "PROCALCITON", "LATICACIDVEN" in the last 168 hours.  No results found for this or any previous visit (from the past 240 hour(s)).       Radiology  Studies: CT CHEST WO CONTRAST  Result Date: 05/07/2023 CLINICAL DATA:  Atelectasis or consolidation left base on the last chest series. History of non-Hodgkin's lymphoma. EXAM: CT CHEST WITHOUT CONTRAST TECHNIQUE: Multidetector CT imaging of the chest was performed following the standard protocol without IV contrast. RADIATION DOSE REDUCTION: This exam was performed according to the departmental dose-optimization program which includes automated exposure control, adjustment of the mA and/or kV according to patient size and/or use of iterative reconstruction technique. COMPARISON:  AP Lat chest 05/06/2023, AP Lat chest 06/01/2017, PET-CT 07/29/2016, and chest CT with contrast 12/10/2015. FINDINGS: Cardiovascular: The cardiac size is normal. The coronary arteries are heavily calcified. There is a stent in the left main coronary artery as before. Minimal anterior pericardial effusion unchanged. Pulmonary arteries and veins are within normal caliber limits. There are moderate to heavy aortic calcific plaques with tortuosity, without aneurysm. Patchy calcifications in the great vessels. Likely calcific origin stenosis right subclavian artery. There is lipomatous hypertrophy of the interatrial septum, interval increased. Clinical correlation suggested as this can be arrhythmogenic. Old right chest port with IJ approach catheter terminating in the distal SVC. Mediastinum/Nodes: There previously  was a mildly prominent posterior right mid hilar lymph node which is no longer seen. Without contrast no enlarged intrathoracic lymph nodes are identified. There is a 1.5 cm hypodense nodule in the mid right lobe of the thyroid gland, previously 1 cm. Nonemergent ultrasound follow-up is recommended. Reference: J Am Coll Radiol. 2015 Feb;12(2): 143-50. Axillary spaces are clear. The trachea is clear. No esophageal thickening or hiatal hernia. Lungs/Pleura: The lungs are moderately emphysematous with centrilobular changes predominating and there is diffuse bronchial thickening without visible bronchial plugging. Interval new trace symmetric pleural layering effusions. No pneumothorax. There are mild posterior atelectatic changes in the lungs. 3 mm chronic left lower lobe fissural nodule on 5:91 and a 3 mm chronic left lower lobe lateral basal nodule on 5:127 are both unchanged. There are minimal subpleural ground-glass interstitial changes laterally in the right middle and lower lobes. This was not seen previously and could be due to interval new post pneumonic scarring or a low-grade active pneumonitis. Rest of the lungs are generally clear.  No pneumothorax. Upper Abdomen: No acute abnormality. Abdominal aortic and visceral branch vessel atherosclerosis. Musculoskeletal: Bilateral dendritic gynecomastia with no other significant chest wall findings. There is chronic sclerotic change in the left glenoid process and posterolateral right sixth rib, as well as patchy chronic sclerosis in the T11 vertebral body with a mild chronic upper plate compression fracture. No new or acute skeletal abnormality is seen. IMPRESSION: 1. Emphysema with diffuse bronchial thickening. 2. Subpleural ground-glass interstitial changes laterally in the right middle and lower lobe. This could be due to interval post pneumonic scarring new from 2017 or a low-grade active pneumonitis. 3. Interval new trace pleural effusions. 4. Aortic and  coronary artery atherosclerosis. 5. Lipomatous hypertrophy of the interatrial septum increased from 2017. 6. 1.5 cm right thyroid nodule. Nonemergent ultrasound follow-up is recommended. 7. Chronic sclerotic change in the left glenoid process, posterolateral right sixth rib, and T11 vertebral body with a mild chronic upper plate Y60 compression fracture. No new or acute skeletal abnormality. 8. Bilateral dendritic gynecomastia. 9. No appreciable adenopathy without contrast. 10. Two stable 3 mm left lung nodules.  No new nodules. 11. For lung cancer screening, adhere to Lung-RADS guidelines. Aortic Atherosclerosis (ICD10-I70.0). Electronically Signed   By: Almira Bar M.D.   On: 05/07/2023 22:50  LOS: 3 days   Time spent= 35 mins    Miguel Rota, MD [facility]  If 7PM-7AM, please contact night-coverage  05/09/2023, 12:47 PM

## 2023-05-09 NOTE — Hospital Course (Signed)
    Hospital course:   72 year old man with history of clear-cell cancer s/p right nephrectomy, CKD 3A, large diffuse B-cell lymphoma in remission, HTN, seizure disorder, PAD was admitted yesterday for AKI found on routine blood work.  Patient also notes that he has had unintentional weight loss of about 30 pounds over the past 3 to 4 months.  Patient was found to be in AKI, nephrology consulted, holding off on nephrotoxic drugs.   Assessment & Plan:   AKI on CKD 3B Metabolic acidosis Resolved.  Creatinine now close to baseline of 1.5.  Peaked at 4.43.  Seen by nephrology.    Combined HFrEF/HFpEF HTN Echo in 11/18/2022 showed EF of 40% but with moderate concentric hypertrophy.  Continue Coreg.  Holding Entresto/HCTZ    Unexplained weight  Possible food insecurity Over the last 6 months patient has lost about 20 pounds.  Concerns of some food insecurity as.  Will discontinue.  TOC consulted.  Will also consult dietitian.  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  Should go back to outpatient oncology.  Most of the serologic workup is negative except ANA.  Continue Atarax. He has not showered  >58yrs.   CAD and peripheral vascular disease Continue Plavix and Zetia   Seizure disorder Continue Dilantin   Anemia of chronic disease Baseline hemoglobin around 10, currently stable without any obvious evidence of bleeding  Hypomagnesemia - Repletion  PT/OT-home with home health   DVT prophylaxis: Subcu heparin Code Status: Full Family Communication: Mandy updated.  Hopefully discharge home in next 24 hours.

## 2023-05-10 DIAGNOSIS — N189 Chronic kidney disease, unspecified: Secondary | ICD-10-CM | POA: Diagnosis not present

## 2023-05-10 DIAGNOSIS — N179 Acute kidney failure, unspecified: Secondary | ICD-10-CM | POA: Diagnosis not present

## 2023-05-10 LAB — BASIC METABOLIC PANEL
Anion gap: 8 (ref 5–15)
BUN: 24 mg/dL — ABNORMAL HIGH (ref 8–23)
CO2: 18 mmol/L — ABNORMAL LOW (ref 22–32)
Calcium: 7.5 mg/dL — ABNORMAL LOW (ref 8.9–10.3)
Chloride: 114 mmol/L — ABNORMAL HIGH (ref 98–111)
Creatinine, Ser: 2.03 mg/dL — ABNORMAL HIGH (ref 0.61–1.24)
GFR, Estimated: 34 mL/min — ABNORMAL LOW (ref >60)
Glucose, Bld: 91 mg/dL (ref 70–99)
Potassium: 3.9 mmol/L (ref 3.5–5.1)
Sodium: 140 mmol/L (ref 135–145)

## 2023-05-10 LAB — MAGNESIUM: Magnesium: 1.2 mg/dL — ABNORMAL LOW (ref 1.7–2.4)

## 2023-05-10 LAB — KAPPA/LAMBDA LIGHT CHAINS
Kappa free light chain: 31.8 mg/L — ABNORMAL HIGH (ref 3.3–19.4)
Kappa, lambda light chain ratio: 0.81 (ref 0.26–1.65)
Lambda free light chains: 39.4 mg/L — ABNORMAL HIGH (ref 5.7–26.3)

## 2023-05-10 LAB — ANCA TITERS
Atypical P-ANCA titer: <1:20 {titer}
C-ANCA: <1:20 {titer}
P-ANCA: <1:20 {titer}

## 2023-05-10 LAB — ANTIEXTRACTABLE NUCLEAR AG
ENA SM Ab Ser-aCnc: <0.2 AI (ref 0.0–0.9)
Ribonucleic Protein: <0.2 AI (ref 0.0–0.9)

## 2023-05-10 LAB — C4 COMPLEMENT: Complement C4, Body Fluid: 33 mg/dL (ref 12–38)

## 2023-05-10 LAB — HEPATITIS B SURFACE ANTIBODY, QUANTITATIVE: Hep B S AB Quant (Post): <3.5 m[IU]/mL — ABNORMAL LOW

## 2023-05-10 LAB — RHEUMATOID FACTOR: Rheumatoid fact SerPl-aCnc: <10 [IU]/mL (ref <14.0)

## 2023-05-10 LAB — C3 COMPLEMENT: C3 Complement: 111 mg/dL (ref 82–167)

## 2023-05-10 MED ORDER — MAGNESIUM SULFATE 4 GM/100ML IV SOLN
4.0000 g | Freq: Once | INTRAVENOUS | Status: AC
  Administered 2023-05-10: 4 g via INTRAVENOUS
  Filled 2023-05-10 (×2): qty 100

## 2023-05-10 MED ORDER — MAGNESIUM GLUCONATE 500 MG PO TABS
500.0000 mg | ORAL_TABLET | Freq: Every day | ORAL | Status: DC
  Administered 2023-05-10 – 2023-05-12 (×3): 500 mg via ORAL
  Filled 2023-05-10 (×4): qty 1

## 2023-05-10 MED ORDER — SODIUM CHLORIDE 0.9 % IV SOLN: INTRAVENOUS | Status: AC

## 2023-05-10 MED ORDER — MAGNESIUM OXIDE -MG SUPPLEMENT 400 (240 MG) MG PO TABS: 800.0000 mg | ORAL_TABLET | Freq: Once | ORAL | Status: DC

## 2023-05-10 NOTE — TOC Initial Note (Signed)
Transition of Care [facility]) - Initial/Assessment Note    Patient Details  Name: Gilbert [name] MRN: [id number] Date of Birth: 1951/02/08  [facility]) CM/SW Contact:    Janae Bridgeman, RN Phone Number: 05/10/2023, 11:56 AM  Clinical Narrative:                 CM met with the patient at the bedside to discuss TOC needs.  The patient lives at home with his adult daughter, [name], who works during the day as a short-distance Naval architect.    Patient states that he "came in for itching" but he is still itching.   MD report that the patient has now showered in > 5 years. I spoke with the patient and he states that he is unable to provide for his groceries at the home due to the rising costs.  Patient states that he only qualified for $11 in food stamps recently so "he didn't bother with getting them" through social services.  Patient was agreeable to [facility] MSW to assist with [employer] and placement on [employer]    Patient was provided with Medicare choice regarding home health services and he did not have a preference.  I called Zollie Scale and Kandee Keen, RNCM accepted for [facility] PT, MSW.  Patient states that he already has a 3:1 at the home along with RW and Cane.  Patient plans to discharge home when medically stable.  Patient was complaints of itching and MD and bedside nursing were notified for medication administration as ordered by MD.  Expected Discharge Plan: Home w Home Health Services Barriers to Discharge: Continued Medical Work up   Patient Goals and CMS Choice Patient states their goals for this hospitalization and ongoing recovery are:: To get better and stop "itching" CMS Medicare.gov Compare Post Acute Care list provided to:: Patient Choice offered to / list presented to : Patient [place] ownership interest in [facility].provided to:: Patient    Expected Discharge Plan and Services   Discharge Planning Services:  CM Consult Post Acute Care Choice: Home Health Living arrangements for the past 2 months: Single Family Home                           HH Arranged: PT, Social Work [employer] Agency: [facility] Date [facility] Agency Contacted: 05/10/23 Time HH Agency Contacted: [id number] Representative spoke with at [facility] Agency: [facility] called and accepted for PT and MSW  Prior Living Arrangements/Services Living arrangements for the past 2 months: Single Family Home Lives with:: Adult Children (Lives with daughter, [name] at the home) Patient language and need for interpreter reviewed:: Yes Do you feel safe going back to the place where you live?: Yes      Need for Family Participation in Patient Care: Yes (Comment) Care giver support system in place?: Yes (comment) Current home services: DME (RW, Cane, 3:1 (has one at the home per patient)) Criminal Activity/Legal Involvement Pertinent to Current Situation/Hospitalization: No - Comment as needed  Activities of Daily Living   ADL Screening (condition at time of admission) Independently performs ADLs?: No Does the patient have a NEW difficulty with bathing/dressing/toileting/self-feeding that is expected to last >3 days?: No Does the patient have a NEW difficulty with getting in/out of bed, walking, or climbing stairs that is expected to last >3 days?: No Does the patient have a NEW difficulty with communication that is expected  to last >3 days?: No Is the patient deaf or have difficulty hearing?: No Does the patient have difficulty seeing, even when wearing glasses/contacts?: No Does the patient have difficulty concentrating, remembering, or making decisions?: No  Permission Sought/Granted Permission sought to share information with : Case Manager, Magazine features editor       Permission granted to share info w AGENCY: [facility] accepted for [facility]        Emotional Assessment Appearance:: Appears stated age Attitude/Demeanor/Rapport:  Gracious Affect (typically observed): Accepting Orientation: : Oriented to Self, Oriented to Place, Oriented to  Time, Oriented to Situation Alcohol / Substance Use: Not Applicable Psych Involvement: No (comment)  Admission diagnosis:  Hyperkalemia [E87.5] Pruritus [L29.9] Elevated uric acid in blood [E79.0] Acute renal failure, unspecified acute renal failure type (HCC) [N17.9] Acute kidney injury superimposed on chronic kidney disease (HCC) [N17.9, N18.9] Patient Active Problem List   Diagnosis Date Noted   Malnutrition of moderate degree 05/09/2023   Hyperkalemia 05/06/2023   Metabolic acidosis 05/06/2023   Acute systolic heart failure (HCC)    Mixed hyperlipidemia 12/24/2019   Chronic systolic heart failure (HCC) 03/23/2019   CHF (congestive heart failure) (HCC) 06/01/2017   Acute kidney injury superimposed on chronic kidney disease (HCC) 04/07/2017   Orthostatic hypotension 04/07/2017   Diffuse non-Hodgkin's lymphoma of bone (HCC) 01/08/2016   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/07/2016   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   Hematuria 10/29/2015   Dysphagia, pharyngoesophageal phase    Early satiety    Esophageal stricture    Gastritis and gastroduodenitis    PAD (peripheral artery disease) (HCC) 05/28/2014   Ischemic cardiomyopathy 04/03/2014   History of left heart catheterization (LHC) 02/26/2014   Arterial bleed, intraoperative 02/26/2014   Seizures (HCC) 11/27/2013   Essential hypertension 11/27/2013   Hypomagnesemia 11/27/2013   Coronary artery disease involving native coronary artery of native heart without angina pectoris 11/26/2013   Renal cancer (HCC) 10/19/2013   Coronary artery calcification 09/25/2013   Renal cyst 08/30/2013   Loss of weight 08/03/2013   PCP:  Shade Flood, MD Pharmacy:   [id number] - [address] ([address]), [place] - [address] [place] ([place]) [place] [address] Phone: [phone] Fax:  [phone]     Social Determinants of Health (SDOH) Social History: SDOH Screenings   Food Insecurity: No Food Insecurity (05/06/2023)  Housing: Low Risk  (05/06/2023)  Transportation Needs: No Transportation Needs (05/06/2023)  Utilities: Not At Risk (05/06/2023)  Alcohol Screen: Low Risk  (03/11/2022)  Depression (PHQ2-9): Low Risk  (02/24/2023)  Financial Resource Strain: Low Risk  (03/11/2022)  Physical Activity: Insufficiently Active (03/11/2022)  Social Connections: Socially Isolated (03/11/2022)  Stress: No Stress Concern Present (03/11/2022)  Tobacco Use: High Risk (05/06/2023)   SDOH Interventions:     Readmission Risk Interventions    05/10/2023   11:56 AM  Readmission Risk Prevention Plan  Transportation Screening Complete  PCP or Specialist Appt within 3-5 Days Complete  HRI or Home Care Consult Complete  Social Work Consult for Recovery Care Planning/Counseling Complete  Palliative Care Screening Complete  Medication Review Oceanographer) Complete

## 2023-05-10 NOTE — Progress Notes (Signed)
[facility] Progress Note   Assessment/ Plan:   # AKI  - secondary to pre-renal and ischemic insults.  He likely had hypotension and a decreased requirement for blood pressure medications in the setting of unintentional weight loss.  Note hx of nephrectomy and therefore decreased renal reserve - Improving with supportive care  - hold home entresto and lasix - s/p IVFs- appears volume replete, would not restart  - will follow peripherally with the serologic workup but nothing else to add from renal perspective for now     # Solitary kidney  - s/p right nephrectomy in 2015 with Dr. Berneice Heinrich   # Ischemic CM  - note EF 40% by 11/2022 echocardiogram - as above hold entresto and lasix - gently hydrate as above - would remain off of entresto for now and Marini need to reduce dose of lasix as OP   # HTN  - holding entresto and lasix  - I have reduced coreg to now 3.125 mg BID - agents are directed at cardiomyopathy  - his anti-hypertensive requirement has likely changed with his weight loss   # Skin rash /pruritis - I have ordered a serological workup- including ANA, complements, ANCA (already pending) anti-GBM (already pending), ENA, complement, RF - LDH not that high - phos ok - ? Is this paraneoplastic with B cell lymphoma?  Has worsening anemia and also weight loss.  Szeto be worth it to consider skin biopsy but will defer to primary team- other factors at play that could explain this   # Unintentional weight loss  - Concerning for malignancy  - note hx of renal mass/malignancy and nephrectomy for the same   # Diarrhea: - GI panel pending - changed to Mg gluconate supps since the oxide formulation can contribute.  Subjective:    Cr has plateaued.  Ongoing pruritus.     Objective:   BP 108/60 (BP Location: Left Arm)   Pulse 78   Temp 98.6 F (37 C) (Oral)   Resp 16   SpO2 98%   Physical Exam: Gen:NAD, sitting in bed CVS: RRR Resp: clear Abd: soft Ext: no LE  edema SKIN: allover excoriations/ areas of scabbing- no fresh lesions to look at  Labs: BMET Recent Labs  Lab 05/05/23 1113 05/05/23 2326 05/06/23 0032 05/06/23 1308 05/06/23 0737 05/07/23 0528 05/08/23 0657 05/09/23 0500 05/10/23 0258  NA 135 138  --   --  135 139 141 142 140  K 5.7* 5.8*  --   --  4.9 4.4 4.3 4.4 3.9  CL 110 110  --   --  110 113* 116* 118* 114*  CO2 17* 13*  --   --  15* 16* 16* 18* 18*  GLUCOSE 90 92  --   --  78 85 81 80 91  BUN 62* 66*  --   --  62* 54* 38* 28* 24*  CREATININE 4.31* 4.43*  --   --  4.15* 3.26* 2.55* 2.07* 2.03*  CALCIUM 9.3 9.1  --   --  8.5* 8.1* 7.9* 7.8* 7.5*  PHOS  --   --  4.3 4.1  --   --   --   --   --    CBC Recent Labs  Lab 05/05/23 2206 05/07/23 0528 05/08/23 0657 05/09/23 0750  WBC 8.8 4.9 4.8 5.8  NEUTROABS 5.1 3.0  --   --   HGB 9.5* 8.1* 8.9* 8.2*  HCT 29.7* 25.2* 27.5* 25.6*  MCV 90.5 88.7 91.1 91.1  PLT 216 141* 134* 116*      Medications:     carvedilol  3.125 mg Oral BID WC   Chlorhexidine Gluconate Cloth  6 each Topical Daily   clopidogrel  75 mg Oral Daily   ezetimibe  10 mg Oral Daily   famotidine  20 mg Oral Daily   feeding supplement  237 mL Oral BID BM   heparin  5,000 Units Subcutaneous Q8H   magnesium gluconate  500 mg Oral Daily   phenytoin  300 mg Oral Daily   rosuvastatin  40 mg Oral Daily   sodium bicarbonate  650 mg Oral TID   sodium chloride flush  10-40 mL Intracatheter Q12H     Bufford Buttner, MD 05/10/2023, 2:41 PM

## 2023-05-10 NOTE — Progress Notes (Signed)
PROGRESS NOTE    Gilbert [name]  VWU:[id number] DOB: August 04, 1950 DOA: 05/05/2023 PCP: Shade Flood, MD       Hospital course:   72 year old man with history of clear-cell cancer s/p right nephrectomy, CKD 3A, large diffuse B-cell lymphoma in remission, HTN, seizure disorder, PAD was admitted yesterday for AKI found on routine blood work.  Patient also notes that he has had unintentional weight loss of about 30 pounds over the past 3 to 4 months.  Patient was found to be in AKI, nephrology consulted, holding off on nephrotoxic drugs.   Assessment & Plan:   AKI on CKD 3B Metabolic acidosis Appears to be prerenal in nature.  Baseline creatinine around 1.5, admission creatinine 4.3 peaked at 4.43.  Slowly improving, nephrology following   Combined HFrEF/HFpEF HTN Echo in 11/18/2022 showed EF of 40% but with moderate concentric hypertrophy.  Currently holding off on nephrotoxic drugs including Entresto, HCTZ.  Continue Coreg 3.125 mg twice daily.  Gentle hydration   Unexplained weight  Possible food insecurity Over the last 6 months patient has lost about 20 pounds.  Concerns of some food insecurity as.  Will discontinue.  TOC consulted.  Will also consult dietitian.  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  Should go back to outpatient oncology.  Most of the serologic workup is negative.  Continue Atarax. He has not showered  >57yrs.   CAD and peripheral vascular disease Continue Plavix and Zetia   Seizure disorder Continue Dilantin   Anemia of chronic disease Baseline hemoglobin around 10, currently stable without any obvious evidence of bleeding  Hypomagnesemia - Repletion   DVT prophylaxis: Subcu heparin Code  Status: Full Family Communication: Mandy updated.  Hopefully discharge home in next 24 hours.             Subjective:  Poor PO intake. Doesn't really take care of himself per daughter.   Examination:  General exam: Appears calm and comfortable ; cachectic frail.  Respiratory system: Clear to auscultation. Respiratory effort normal. Cardiovascular system: S1 & S2 heard, RRR. No JVD, murmurs, rubs, gallops or clicks. No pedal edema. Gastrointestinal system: Abdomen is nondistended, soft and nontender. No organomegaly or masses felt. Normal bowel sounds heard. Central nervous system: Alert and oriented. No focal neurological deficits. Extremities: Symmetric 4 x 5 power. Skin: diffuse excoriations.  Psychiatry: Judgement and insight appear normal. Mood & affect appropriate.       Diet Orders (From admission, onward)     Start     Ordered   05/10/23 1053  Diet regular Room service appropriate? Yes; Fluid consistency: Thin  Diet effective now       Question Answer Comment  Room service appropriate? Yes   Fluid consistency: Thin      05/10/23 1052            Objective: Vitals:   05/09/23 1645 05/09/23 2057 05/10/23 0420 05/10/23 0901  BP:  117/72 (!) 117/54 108/60  Pulse: 84 77 71 78  Resp: 16  16   Temp: 98.4 F (36.9 C) 99 F (37.2 C) 98.4 F (36.9 C) 98.6 F (37 C)  TempSrc: Oral Oral Oral Oral  SpO2: 100% 100% 97% 98%    Intake/Output Summary (Last 24 hours) at 05/10/2023 1052 Last data filed at 05/09/2023 2100  Gross per 24 hour  Intake --  Output 450 ml  Net -450 ml   There were no vitals filed for this visit.  Scheduled Meds:  carvedilol  3.125 mg Oral BID WC   Chlorhexidine Gluconate Cloth  6 each Topical Daily   clopidogrel  75 mg Oral Daily   ezetimibe  10 mg Oral Daily   famotidine  20 mg Oral Daily   feeding supplement  237 mL Oral BID BM   heparin  5,000 Units Subcutaneous Q8H   magnesium gluconate  500 mg Oral Daily   phenytoin  300  mg Oral Daily   rosuvastatin  40 mg Oral Daily   sodium bicarbonate  650 mg Oral TID   sodium chloride flush  10-40 mL Intracatheter Q12H   Continuous Infusions:  sodium chloride     magnesium sulfate bolus IVPB      Nutritional status Signs/Symptoms: moderate fat depletion, moderate muscle depletion Interventions: Ensure Enlive (each supplement provides 350kcal and 20 grams of protein) There is no height or weight on file to calculate BMI.  Data Reviewed:   CBC: Recent Labs  Lab 05/05/23 1113 05/05/23 2206 05/07/23 0528 05/08/23 0657 05/09/23 0750  WBC 12.6* 8.8 4.9 4.8 5.8  NEUTROABS  --  5.1 3.0  --   --   HGB 10.0* 9.5* 8.1* 8.9* 8.2*  HCT 30.9* 29.7* 25.2* 27.5* 25.6*  MCV 90.1 90.5 88.7 91.1 91.1  PLT 241.0 216 141* 134* 116*   Basic Metabolic Panel: Recent Labs  Lab 05/06/23 0032 05/06/23 0616 05/06/23 0737 05/07/23 0528 05/08/23 0657 05/09/23 0500 05/10/23 0258  NA  --   --  135 139 141 142 140  K  --   --  4.9 4.4 4.3 4.4 3.9  CL  --   --  110 113* 116* 118* 114*  CO2  --   --  15* 16* 16* 18* 18*  GLUCOSE  --   --  78 85 81 80 91  BUN  --   --  62* 54* 38* 28* 24*  CREATININE  --   --  4.15* 3.26* 2.55* 2.07* 2.03*  CALCIUM  --   --  8.5* 8.1* 7.9* 7.8* 7.5*  MG  --   --   --   --   --   --  1.2*  PHOS 4.3 4.1  --   --   --   --   --    GFR: Estimated Creatinine Clearance: 30.8 mL/min (A) (by C-G formula based on SCr of 2.03 mg/dL (H)). Liver Function Tests: Recent Labs  Lab 05/05/23 1113 05/05/23 2326 05/08/23 0657  AST 22 23 23   ALT 13 16 17   ALKPHOS 147* 135* 110  BILITOT 0.4 0.2* 0.3  PROT 6.6 5.9* 5.0*  ALBUMIN 3.5 2.8* 2.2*   No results for input(s): "LIPASE", "AMYLASE" in the last 168 hours. No results for input(s): "AMMONIA" in the last 168 hours. Coagulation Profile: No results for input(s): "INR", "PROTIME" in the last 168 hours. Cardiac Enzymes: No results for input(s): "CKTOTAL", "CKMB", "CKMBINDEX", "TROPONINI" in the  last 168 hours. BNP (last 3 results) No results for input(s): "PROBNP" in the last 8760 hours. HbA1C: No results for input(s): "HGBA1C" in the last 72 hours. CBG: No results for input(s): "GLUCAP" in the last 168 hours. Lipid Profile: No results for input(s): "CHOL", "HDL", "LDLCALC", "TRIG", "CHOLHDL", "LDLDIRECT" in the last 72 hours. Thyroid Function Tests: Recent Labs    05/09/23 0750  TSH 0.821  Anemia Panel: No results for input(s): "VITAMINB12", "FOLATE", "FERRITIN", "TIBC", "IRON", "RETICCTPCT" in the last 72 hours. Sepsis Labs: No results for input(s): "PROCALCITON", "LATICACIDVEN" in the last 168 hours.  No results found for this or any previous visit (from the past 240 hour(s)).       Radiology Studies: No results found.         LOS: 4 days   Time spent= 35 mins    Miguel Rota, MD [facility]  If 7PM-7AM, please contact night-coverage  05/10/2023, 10:52 AM

## 2023-05-11 ENCOUNTER — Inpatient Hospital Stay (HOSPITAL_COMMUNITY): Payer: Medicare Other

## 2023-05-11 DIAGNOSIS — N189 Chronic kidney disease, unspecified: Secondary | ICD-10-CM | POA: Diagnosis not present

## 2023-05-11 DIAGNOSIS — N179 Acute kidney failure, unspecified: Secondary | ICD-10-CM | POA: Diagnosis not present

## 2023-05-11 LAB — BASIC METABOLIC PANEL
Anion gap: 5 (ref 5–15)
BUN: 22 mg/dL (ref 8–23)
CO2: 20 mmol/L — ABNORMAL LOW (ref 22–32)
Calcium: 7.8 mg/dL — ABNORMAL LOW (ref 8.9–10.3)
Chloride: 117 mmol/L — ABNORMAL HIGH (ref 98–111)
Creatinine, Ser: 1.79 mg/dL — ABNORMAL HIGH (ref 0.61–1.24)
GFR, Estimated: 40 mL/min — ABNORMAL LOW (ref >60)
Glucose, Bld: 90 mg/dL (ref 70–99)
Potassium: 3.9 mmol/L (ref 3.5–5.1)
Sodium: 142 mmol/L (ref 135–145)

## 2023-05-11 LAB — PHOSPHORUS: Phosphorus: 2.7 mg/dL (ref 2.5–4.6)

## 2023-05-11 LAB — ENA+DNA/DS+ANTICH+CENTRO+JO...
Anti JO-1: <0.2 AI (ref 0.0–0.9)
Centromere Ab Screen: 1.1 AI — ABNORMAL HIGH (ref 0.0–0.9)
Chromatin Ab SerPl-aCnc: <0.2 AI (ref 0.0–0.9)
ENA SM Ab Ser-aCnc: <0.2 AI (ref 0.0–0.9)
Ribonucleic Protein: <0.2 AI (ref 0.0–0.9)
SSA (Ro) (ENA) Antibody, IgG: <0.2 AI (ref 0.0–0.9)
SSB (La) (ENA) Antibody, IgG: <0.2 AI (ref 0.0–0.9)
Scleroderma (Scl-70) (ENA) Antibody, IgG: <0.2 AI (ref 0.0–0.9)
ds DNA Ab: 2 [IU]/mL (ref 0–9)

## 2023-05-11 LAB — PHENYTOIN LEVEL, FREE AND TOTAL
Phenytoin, Free: 1.7 ug/mL (ref 1.0–2.0)
Phenytoin, Total: 11.5 ug/mL (ref 10.0–20.0)

## 2023-05-11 LAB — ANA W/REFLEX IF POSITIVE: Anti Nuclear Antibody (ANA): POSITIVE — AB

## 2023-05-11 LAB — MAGNESIUM: Magnesium: 2.1 mg/dL (ref 1.7–2.4)

## 2023-05-11 NOTE — Progress Notes (Signed)
PROGRESS NOTE    Gilbert [name]  ZOX:[id number] DOB: 1951-03-05 DOA: 05/05/2023 PCP: Shade Flood, MD       Hospital course:   72 year old man with history of clear-cell cancer s/p right nephrectomy, CKD 3A, large diffuse B-cell lymphoma in remission, HTN, seizure disorder, PAD was admitted yesterday for AKI found on routine blood work.  Patient also notes that he has had unintentional weight loss of about 30 pounds over the past 3 to 4 months.  Patient was found to be in AKI, nephrology consulted, holding off on nephrotoxic drugs.   Assessment & Plan:   AKI on CKD 3B Metabolic acidosis Resolved.  Creatinine now close to baseline of 1.5.  Peaked at 4.43.  Seen by nephrology.    Combined HFrEF/HFpEF HTN Echo in 11/18/2022 showed EF of 40% but with moderate concentric hypertrophy.  Continue Coreg.  Holding Entresto/HCTZ    Unexplained weight  Possible food insecurity Over the last 6 months patient has lost about 20 pounds.  Concerns of some food insecurity as.  Will discontinue.  TOC consulted.  Will also consult dietitian.  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  Should go back to outpatient oncology.  Most of the serologic workup is negative except ANA.  Continue Atarax. He has not showered  >76yrs.   CAD and peripheral vascular disease Continue Plavix and Zetia   Seizure disorder Continue Dilantin   Anemia of chronic disease Baseline hemoglobin around 10, currently stable without any obvious evidence of bleeding  Hypomagnesemia - Repletion   DVT prophylaxis: Subcu heparin Code Status: Full Family Communication: Mandy updated.  Hopefully discharge home in next 24 hours.         Subjective:  Patient seen at bedside, does not have any complaints at this time.  Oral intake still remains poor.  No issues with dysphagia. Overall chooses not to take good care of himself  Examination:  General exam: Appears calm and comfortable  Respiratory system: Clear to auscultation. Respiratory effort normal. Cardiovascular system: S1 & S2 heard, RRR. No JVD, murmurs, rubs, gallops or clicks. No pedal edema. Gastrointestinal system: Abdomen is nondistended, soft and nontender. No organomegaly or masses felt. Normal bowel sounds heard. Central nervous system: Alert and oriented. No focal neurological deficits. Extremities: Symmetric 5 x 5 power. Skin: No rashes, lesions or ulcers Psychiatry: Judgement and insight appear normal. Mood & affect appropriate.       Diet Orders (From admission, onward)     Start     Ordered   05/10/23 1053  Diet regular Room service appropriate? Yes; Fluid consistency: Thin  Diet effective now       Question Answer Comment  Room service appropriate? Yes   Fluid consistency: Thin      05/10/23 1052            Objective: Vitals:   05/10/23 1808 05/10/23 2002 05/11/23 0404 05/11/23 0822  BP: 109/62 (!) 117/59 (!) 129/106 (!) 126/57  Pulse: 77 72 73 72  Resp:  18 18 18   Temp:  98.4 F (36.9 C) 98.1 F (36.7 C) 97.6 F (36.4 C)  TempSrc:  Oral Oral Oral  SpO2:  99% 99% 100%    Intake/Output Summary (Last 24 hours) at 05/11/2023 1041 Last data filed at 05/10/2023 2100 Gross per  24 hour  Intake 137.49 ml  Output 575 ml  Net -437.51 ml   There were no vitals filed for this visit.  Scheduled Meds:  carvedilol  3.125 mg Oral BID WC   Chlorhexidine Gluconate Cloth  6 each Topical Daily   clopidogrel  75 mg Oral Daily   ezetimibe  10 mg Oral Daily   famotidine  20 mg Oral Daily   feeding supplement  237 mL Oral BID BM   heparin  5,000 Units Subcutaneous Q8H   magnesium gluconate  500 mg Oral Daily   phenytoin  300 mg  Oral Daily   rosuvastatin  40 mg Oral Daily   sodium bicarbonate  650 mg Oral TID   sodium chloride flush  10-40 mL Intracatheter Q12H   Continuous Infusions:  Nutritional status Signs/Symptoms: moderate fat depletion, moderate muscle depletion Interventions: Ensure Enlive (each supplement provides 350kcal and 20 grams of protein) There is no height or weight on file to calculate BMI.  Data Reviewed:   CBC: Recent Labs  Lab 05/05/23 1113 05/05/23 2206 05/07/23 0528 05/08/23 0657 05/09/23 0750  WBC 12.6* 8.8 4.9 4.8 5.8  NEUTROABS  --  5.1 3.0  --   --   HGB 10.0* 9.5* 8.1* 8.9* 8.2*  HCT 30.9* 29.7* 25.2* 27.5* 25.6*  MCV 90.1 90.5 88.7 91.1 91.1  PLT 241.0 216 141* 134* 116*   Basic Metabolic Panel: Recent Labs  Lab 05/06/23 0032 05/06/23 0616 05/06/23 0737 05/07/23 0528 05/08/23 0657 05/09/23 0500 05/10/23 0258 05/11/23 0341  NA  --   --    < > 139 141 142 140 142  K  --   --    < > 4.4 4.3 4.4 3.9 3.9  CL  --   --    < > 113* 116* 118* 114* 117*  CO2  --   --    < > 16* 16* 18* 18* 20*  GLUCOSE  --   --    < > 85 81 80 91 90  BUN  --   --    < > 54* 38* 28* 24* 22  CREATININE  --   --    < > 3.26* 2.55* 2.07* 2.03* 1.79*  CALCIUM  --   --    < > 8.1* 7.9* 7.8* 7.5* 7.8*  MG  --   --   --   --   --   --  1.2* 2.1  PHOS 4.3 4.1  --   --   --   --   --  2.7   < > = values in this interval not displayed.   GFR: Estimated Creatinine Clearance: 34.9 mL/min (A) (by C-G formula based on SCr of 1.79 mg/dL (H)). Liver Function Tests: Recent Labs  Lab 05/05/23 1113 05/05/23 2326 05/08/23 0657  AST 22 23 23   ALT 13 16 17   ALKPHOS 147* 135* 110  BILITOT 0.4 0.2* 0.3  PROT 6.6 5.9* 5.0*  ALBUMIN 3.5 2.8* 2.2*   No results for input(s): "LIPASE", "AMYLASE" in the last 168 hours. No results for input(s): "AMMONIA" in the last 168 hours. Coagulation Profile: No results for input(s): "INR", "PROTIME" in the last 168 hours. Cardiac Enzymes: No results for  input(s): "CKTOTAL", "CKMB", "CKMBINDEX", "TROPONINI" in the last 168 hours. BNP (last 3 results) No results for input(s): "PROBNP" in the last 8760 hours. HbA1C: No results for input(s): "HGBA1C" in the last 72 hours. CBG: No results for input(s): "GLUCAP" in the last 168  hours. Lipid Profile: No results for input(s): "CHOL", "HDL", "LDLCALC", "TRIG", "CHOLHDL", "LDLDIRECT" in the last 72 hours. Thyroid Function Tests: Recent Labs    05/09/23 0750  TSH 0.821   Anemia Panel: No results for input(s): "VITAMINB12", "FOLATE", "FERRITIN", "TIBC", "IRON", "RETICCTPCT" in the last 72 hours. Sepsis Labs: No results for input(s): "PROCALCITON", "LATICACIDVEN" in the last 168 hours.  No results found for this or any previous visit (from the past 240 hour(s)).       Radiology Studies: No results found.         LOS: 5 days   Time spent= 35 mins    Miguel Rota, MD [facility]  If 7PM-7AM, please contact night-coverage  05/11/2023, 10:41 AM

## 2023-05-11 NOTE — Progress Notes (Signed)
Physical Therapy Treatment Patient Details Name: Gilbert [name] MRN: [id number] DOB: 1951-05-24 Today's Date: 05/11/2023   History of Present Illness 72 y.o. male presents to [facility] hospital on 05/05/2023 with AKI. PMH includes clear-cell cancer s/p right nephrectomy, CKD III, B-cell lymphoma, HTN, seizure, PAD.    PT Comments  Pt greeted standing up at bedside, with continued incontinence of stool. Pt demonstrating gait at grossly CGA level with RW for support as pt with increased and unsafe gait speed due to bowel urgency. Educated pt on importance of RW use, donning socks, and decreased gait speed for safety and to decrease risk for falls. Pt declining further ambulation and/or exercise. Pt continues to benefit from skilled PT services to progress toward functional mobility goals.     If plan is discharge home, recommend the following: A little help with walking and/or transfers;A little help with bathing/dressing/bathroom;Assistance with cooking/housework;Assist for transportation;Help with stairs or ramp for entrance   Can travel by private vehicle        Equipment Recommendations  BSC/3in1    Recommendations for Other Services       Precautions / Restrictions Precautions Precautions: Fall Precaution Comments: enteric Restrictions Weight Bearing Restrictions: No     Mobility  Bed Mobility Overal bed mobility: Needs Assistance Bed Mobility: Supine to Sit, Sit to Supine       Sit to supine: Supervision   General bed mobility comments: pt standing up in room on arrival    Transfers Overall transfer level: Needs assistance Equipment used: Rolling walker (2 wheels) Transfers: Sit to/from Stand Sit to Stand: Supervision           General transfer comment: supervision for safety    Ambulation/Gait Ambulation/Gait assistance: Contact guard assist Gait Distance (Feet): 15 Feet (x2) Assistive device: Rolling walker (2 wheels) Gait Pattern/deviations: Step-through pattern,  Drifts right/left Gait velocity: reduced     General Gait Details: increased lateral sway, educated pt on slowing speed for safety and to alaways wear socks for safety and decreased fall risk   Stairs             Wheelchair Mobility     Tilt Bed    Modified Rankin (Stroke Patients Only)       Balance Overall balance assessment: Needs assistance Sitting-balance support: No upper extremity supported, Feet supported Sitting balance-Leahy Scale: Good     Standing balance support: Single extremity supported, Reliant on assistive device for balance Standing balance-Leahy Scale: Poor                              Cognition Arousal: Alert Behavior During Therapy: Impulsive Overall Cognitive Status: Within Functional Limits for tasks assessed                                 General Comments: pt standing up in room barefoot on entry, moving quickly and impusively due to bowel urgency, poor insight into deficits        Exercises      General Comments General comments (skin integrity, edema, etc.): pt incontinent of stool once standing, continues to have diarrhea.      Pertinent Vitals/Pain Pain Assessment Pain Assessment: Faces Faces Pain Scale: Hurts little more Pain Location: L lateral foot with palpation Pain Descriptors / Indicators: Sore Pain Intervention(s): Monitored during session, Limited activity within patient's tolerance    Home Living  Prior Function            PT Goals (current goals can now be found in the care plan section) Acute Rehab PT Goals Patient Stated Goal: to improve strength and activity tolerance PT Goal Formulation: With patient/family Time For Goal Achievement: 05/23/23 Progress towards PT goals: Progressing toward goals    Frequency    Min 1X/week      PT Plan      Co-evaluation              AM-PAC PT "6 Clicks" Mobility   Outcome Measure  Help  needed turning from your back to your side while in a flat bed without using bedrails?: A Little Help needed moving from lying on your back to sitting on the side of a flat bed without using bedrails?: A Little Help needed moving to and from a bed to a chair (including a wheelchair)?: A Little Help needed standing up from a chair using your arms (e.g., wheelchair or bedside chair)?: A Little Help needed to walk in hospital room?: A Little Help needed climbing 3-5 steps with a railing? : A Lot 6 Click Score: 17    End of Session   Activity Tolerance: Patient limited by fatigue Patient left: in bed;with call bell/phone within reach Nurse Communication: Mobility status PT Visit Diagnosis: Other abnormalities of gait and mobility (R26.89);Muscle weakness (generalized) (M62.81)     Time: 1010-1023 PT Time Calculation (min) (ACUTE ONLY): 13 min  Charges:    $Therapeutic Activity: 8-22 mins PT General Charges $$ ACUTE PT VISIT: 1 Visit                     Lisl Slingerland R. PTA Acute Rehabilitation Services Office: [phone]   Catalina Antigua 05/11/2023, 11:00 AM

## 2023-05-11 NOTE — Plan of Care (Signed)
?  Problem: Education: ?Goal: Knowledge of General Education information will improve ?Description: Including pain rating scale, medication(s)/side effects and non-pharmacologic comfort measures ?Outcome: Progressing ?  ?Problem: Health Behavior/Discharge Planning: ?Goal: Ability to manage health-related needs will improve ?Outcome: Progressing ?  ?Problem: Clinical Measurements: ?Goal: Ability to maintain clinical measurements within normal limits will improve ?Outcome: Progressing ?  ?Problem: Clinical Measurements: ?Goal: Will remain free from infection ?Outcome: Progressing ?  ?Problem: Activity: ?Goal: Risk for activity intolerance will decrease ?Outcome: Progressing ?  ?Problem: Nutrition: ?Goal: Adequate nutrition will be maintained ?Outcome: Progressing ?  ?Problem: Skin Integrity: ?Goal: Risk for impaired skin integrity will decrease ?Outcome: Progressing ?  ?Problem: Safety: ?Goal: Ability to remain free from injury will improve ?Outcome: Progressing ?  ?Problem: Pain Managment: ?Goal: General experience of comfort will improve ?Outcome: Progressing ?  ?

## 2023-05-11 NOTE — Progress Notes (Signed)
Brief note- labs reviewed Serologic workup largely negative for skin except a + ANA (titers pending) and slightly elevated anti-centromere Ab screen Cr back to baseline Would consider OP referral to dermatology Nothing else to suggest. Will sign off.  Call with questions.  Bufford Buttner MD [employer] [facility] [phone]

## 2023-05-12 ENCOUNTER — Encounter: Payer: Self-pay | Admitting: Family Medicine

## 2023-05-12 ENCOUNTER — Encounter: Payer: Self-pay | Admitting: Cardiology

## 2023-05-12 DIAGNOSIS — N179 Acute kidney failure, unspecified: Secondary | ICD-10-CM | POA: Diagnosis not present

## 2023-05-12 DIAGNOSIS — N189 Chronic kidney disease, unspecified: Secondary | ICD-10-CM | POA: Diagnosis not present

## 2023-05-12 LAB — BASIC METABOLIC PANEL
Anion gap: 7 (ref 5–15)
BUN: 18 mg/dL (ref 8–23)
CO2: 22 mmol/L (ref 22–32)
Calcium: 8 mg/dL — ABNORMAL LOW (ref 8.9–10.3)
Chloride: 115 mmol/L — ABNORMAL HIGH (ref 98–111)
Creatinine, Ser: 1.67 mg/dL — ABNORMAL HIGH (ref 0.61–1.24)
GFR, Estimated: 43 mL/min — ABNORMAL LOW (ref >60)
Glucose, Bld: 97 mg/dL (ref 70–99)
Potassium: 3.9 mmol/L (ref 3.5–5.1)
Sodium: 144 mmol/L (ref 135–145)

## 2023-05-12 LAB — GASTROINTESTINAL PANEL BY PCR, STOOL (REPLACES STOOL CULTURE)

## 2023-05-12 LAB — CBC
HCT: 24.4 % — ABNORMAL LOW (ref 39.0–52.0)
Hemoglobin: 7.9 g/dL — ABNORMAL LOW (ref 13.0–17.0)
MCH: 29.5 pg (ref 26.0–34.0)
MCHC: 32.4 g/dL (ref 30.0–36.0)
MCV: 91 fL (ref 80.0–100.0)
Platelets: 110 10*3/uL — ABNORMAL LOW (ref 150–400)
RBC: 2.68 MIL/uL — ABNORMAL LOW (ref 4.22–5.81)
RDW: 14.1 % (ref 11.5–15.5)
WBC: 5.1 10*3/uL (ref 4.0–10.5)
nRBC: 0 % (ref 0.0–0.2)

## 2023-05-12 LAB — MAGNESIUM: Magnesium: 1.7 mg/dL (ref 1.7–2.4)

## 2023-05-12 MED ORDER — HEPARIN SOD (PORK) LOCK FLUSH 100 UNIT/ML IV SOLN
500.0000 [IU] | INTRAVENOUS | Status: AC | PRN
  Administered 2023-05-12: 500 [IU]

## 2023-05-12 MED ORDER — CARVEDILOL 3.125 MG PO TABS: 3.1250 mg | ORAL_TABLET | Freq: Two times a day (BID) | ORAL | 0 refills | Status: DC

## 2023-05-12 MED ORDER — HYDROXYZINE HCL 50 MG PO TABS: 50.0000 mg | ORAL_TABLET | Freq: Three times a day (TID) | ORAL | 0 refills | Status: DC | PRN

## 2023-05-12 MED ORDER — MAGNESIUM OXIDE -MG SUPPLEMENT 400 (240 MG) MG PO TABS: 800.0000 mg | ORAL_TABLET | Freq: Once | ORAL | Status: DC

## 2023-05-12 NOTE — Plan of Care (Signed)

## 2023-05-12 NOTE — Discharge Summary (Signed)
Physician Discharge Summary  Titus Rippel ZOX:[id number] DOB: 1950/11/01 DOA: 05/05/2023  PCP: Shade Flood, MD  Admit date: 05/05/2023 Discharge date: 05/12/2023  Admitted From: Home Disposition: Home  Recommendations for Outpatient Follow-up:  Follow up with PCP in 1-2 weeks Please obtain CMP/CBC in one week your next doctors visit.  Atarax 3 times daily for itching Entresto, furosemide has been discontinued for now Continue Coreg and hydroxyzine.  Discharge Condition: Stable CODE STATUS: Full Diet recommendation: Regular      Hospital course:   72 year old man with history of clear-cell cancer s/p right nephrectomy, CKD 3A, large diffuse B-cell lymphoma in remission, HTN, seizure disorder, PAD was admitted yesterday for AKI found on routine blood work.  Patient also notes that he has had unintentional weight loss of about 30 pounds over the past 3 to 4 months.  Patient was found to be in AKI, nephrology consulted, holding off on nephrotoxic drugs.  Slowly his lab work improved.  Entresto and HCTZ were discontinued.  Coreg was reduced.  Due to nonspecific diffuse pruritus/excoriation, autoimmune lab work was performed which was overall negative besides slight elevation in ANA.  PT/OT recommended home health services.  Patient was also seen by nutrition team. Medically stable for discharge with outpatient follow-up.  Assessment & Plan:   AKI on CKD 3B Metabolic acidosis Resolved.  Creatinine now close to baseline of 1.5.  Peaked at 4.43.  Seen by nephrology. Creatinine now back to baseline.   Combined HFrEF/HFpEF HTN Echo in 11/18/2022 showed EF of 40% but with moderate concentric hypertrophy.  Continue Coreg.  Holding Entresto/HCTZ    Unexplained weight  Possible food insecurity Seen by rotation.  Patient should follow-up with outpatient PCP to discuss make sure is all the screenings are up-to-date.   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  Should go back to outpatient oncology.  Most of the serologic workup is negative except ANA.  Continue Atarax. He has not showered  >6yrs.   Left foot pain - X-rays negative  CAD and peripheral vascular disease Continue Plavix and Zetia   Seizure disorder Continue Dilantin   Anemia of chronic disease Baseline hemoglobin around 10, currently stable without any obvious evidence of bleeding  Hypomagnesemia - Repletion  PT/OT-home with home health   DVT prophylaxis: Subcu heparin Code Status: Full Family Communication: Called his daughter, no answer Dc today     Discharge Diagnoses:  Principal Problem:   Acute kidney injury superimposed on chronic kidney disease (HCC) Active Problems:   Renal cancer (HCC)   Seizures (HCC)   Essential hypertension   PAD (peripheral artery disease) (HCC)   Diffuse large cell non-Hodgkin's lymphoma (HCC)   Chronic systolic heart failure (HCC)   Hyperkalemia   Metabolic acidosis   Malnutrition of moderate degree      Consultations: Nephro allergy  Subjective: Feeling well no complaints  Discharge Exam: Vitals:   05/12/23 0420 05/12/23 0932  BP: 125/67 119/64  Pulse: 75 72  Resp: 18 19  Temp: 97.8 F (36.6 C) 98.3 F (36.8 C)  SpO2: 98% 99%   Vitals:   05/11/23 1543 05/11/23 2054 05/12/23 0420 05/12/23 0932  BP: 118/61 (!) 122/59 125/67 119/64  Pulse: 69 72 75 72  Resp: 18 18 18 19   Temp: 98.6 F (37 C) 98.6 F (37 C) 97.8 F (36.6 C) 98.3 F (36.8 C)  TempSrc: Oral Oral Oral Oral  SpO2: 98% 98% 98% 99%    General: Pt is alert, awake, not in acute distress Cardiovascular: RRR, S1/S2 +, no rubs, no gallops Respiratory: CTA bilaterally, no wheezing, no rhonchi Abdominal: Soft, NT, ND, bowel sounds + Extremities: no edema, no cyanosis Multiple  excoriation marks throughout his body  Discharge Instructions   Allergies as of 05/12/2023       Reactions   Oxycodone Swelling, Other (See Comments)   Tongue and lips swell        Medication List     STOP taking these medications    Entresto 49-51 MG Generic drug: sacubitril-valsartan   furosemide 40 MG tablet Commonly known as: LASIX   hydrOXYzine 25 MG capsule Commonly known as: VISTARIL   predniSONE 20 MG tablet Commonly known as: DELTASONE       TAKE these medications    acetaminophen 500 MG tablet Commonly known as: TYLENOL Take 1,000 mg by mouth daily.   APPLE CIDER VINEGAR PO Take 480 mg by mouth daily.   B-12 2500 MCG Tabs Take 2,500 mcg by mouth every morning.   carvedilol 3.125 MG tablet Commonly known as: COREG Take 1 tablet (3.125 mg total) by mouth 2 (two) times daily with a meal. What changed:  medication strength how much to take   clopidogrel 75 MG tablet Commonly known as: PLAVIX Take 1 tablet (75 mg total) by mouth daily.   ezetimibe 10 MG tablet Commonly known as: ZETIA Take 1 tablet (10 mg total) by mouth daily.   famotidine 20 MG tablet Commonly known as: PEPCID Take 20 mg by mouth 2 (two) times daily.   hydrOXYzine 50 MG tablet Commonly known as: ATARAX Take 1 tablet (50 mg total) by mouth 3 (three) times daily as needed for itching.   isosorbide mononitrate 60 MG 24 hr tablet Commonly known as: IMDUR Take 1 tablet (60 mg total) by mouth daily.   phenytoin 100 MG ER capsule Commonly known as: DILANTIN Take 3 capsules (300 mg total) by mouth daily.   pyridOXINE 100 MG tablet Commonly known as: VITAMIN B6 Take 100 mg by mouth every morning.   rosuvastatin 40 MG tablet Commonly known as: CRESTOR Take 1 tablet (40 mg total) by mouth daily.   vitamin B-1 250 MG tablet Take 250 mg by mouth every morning.        Follow-up Information     [facility] Follow up.   Specialty: Home Health  Services Why: [facility] will provide home health services including Physical therapy and Medical social worker.  They will call you in the next 24-48 hours to set up services. Contact information: [address] [place] [address] [phone]                Allergies  Allergen Reactions   Oxycodone Swelling and Other (See Comments)    Tongue and lips swell    You were cared for by a hospitalist during your hospital stay. If you have any questions about your discharge medications or the care you received while you were in the hospital after you are discharged, you can call the unit and asked to speak with the hospitalist on call if the hospitalist that took care of you is not available. Once you are discharged, your primary care physician will handle any further medical issues. Please note that no refills for any discharge medications will be authorized once you are discharged, as it is  imperative that you return to your primary care physician (or establish a relationship with a primary care physician if you do not have one) for your aftercare needs so that they can reassess your need for medications and monitor your lab values.  You were cared for by a hospitalist during your hospital stay. If you have any questions about your discharge medications or the care you received while you were in the hospital after you are discharged, you can call the unit and asked to speak with the hospitalist on call if the hospitalist that took care of you is not available. Once you are discharged, your primary care physician will handle any further medical issues. Please note that NO REFILLS for any discharge medications will be authorized once you are discharged, as it is imperative that you return to your primary care physician (or establish a relationship with a primary care physician if you do not have one) for your aftercare needs so that they can reassess your need for medications  and monitor your lab values.  Please request your Prim.MD to go over all Hospital Tests and Procedure/Radiological results at the follow up, please get all Hospital records sent to your Prim MD by signing hospital release before you go home.  Get CBC, CMP, 2 view Chest X ray checked  by Primary MD during your next visit or SNF MD in 5-7 days ( we routinely change or add medications that can affect your baseline labs and fluid status, therefore we recommend that you get the mentioned basic workup next visit with your PCP, your PCP [place] decide not to get them or add new tests based on their clinical decision)  On your next visit with your primary care physician please Get Medicines reviewed and adjusted.  If you experience worsening of your admission symptoms, develop shortness of breath, life threatening emergency, suicidal or homicidal thoughts you must seek medical attention immediately by calling 911 or calling your MD immediately  if symptoms less severe.  You Must read complete instructions/literature along with all the possible adverse reactions/side effects for all the Medicines you take and that have been prescribed to you. Take any new Medicines after you have completely understood and accpet all the possible adverse reactions/side effects.   Do not drive, operate heavy machinery, perform activities at heights, swimming or participation in water activities or provide baby sitting services if your were admitted for syncope or siezures until you have seen by Primary MD or a Neurologist and advised to do so again.  Do not drive when taking Pain medications.   Procedures/Studies: DG Foot 2 Views Left  Result Date: 05/11/2023 CLINICAL DATA:  Foot pain EXAM: LEFT FOOT - 2 VIEW COMPARISON:  None Available. FINDINGS: Osseous demineralization. No fracture or malalignment. Small plantar calcaneal spur. Mild degenerative change at the first MTP joint IMPRESSION: Osseous demineralization. Mild  degenerative change at the first MTP joint. Electronically Signed   By: Jasmine Pang M.D.   On: 05/11/2023 19:00   CT CHEST WO CONTRAST  Result Date: 05/07/2023 CLINICAL DATA:  Atelectasis or consolidation left base on the last chest series. History of non-Hodgkin's lymphoma. EXAM: CT CHEST WITHOUT CONTRAST TECHNIQUE: Multidetector CT imaging of the chest was performed following the standard protocol without IV contrast. RADIATION DOSE REDUCTION: This exam was performed according to the departmental dose-optimization program which includes automated exposure control, adjustment of the mA and/or kV according to patient size and/or use of iterative reconstruction technique. COMPARISON:  AP Lat chest  05/06/2023, AP Lat chest 06/01/2017, PET-CT 07/29/2016, and chest CT with contrast 12/10/2015. FINDINGS: Cardiovascular: The cardiac size is normal. The coronary arteries are heavily calcified. There is a stent in the left main coronary artery as before. Minimal anterior pericardial effusion unchanged. Pulmonary arteries and veins are within normal caliber limits. There are moderate to heavy aortic calcific plaques with tortuosity, without aneurysm. Patchy calcifications in the great vessels. Likely calcific origin stenosis right subclavian artery. There is lipomatous hypertrophy of the interatrial septum, interval increased. Clinical correlation suggested as this can be arrhythmogenic. Old right chest port with IJ approach catheter terminating in the distal SVC. Mediastinum/Nodes: There previously was a mildly prominent posterior right mid hilar lymph node which is no longer seen. Without contrast no enlarged intrathoracic lymph nodes are identified. There is a 1.5 cm hypodense nodule in the mid right lobe of the thyroid gland, previously 1 cm. Nonemergent ultrasound follow-up is recommended. Reference: J Am Coll Radiol. 2015 Feb;12(2): 143-50. Axillary spaces are clear. The trachea is clear. No esophageal thickening  or hiatal hernia. Lungs/Pleura: The lungs are moderately emphysematous with centrilobular changes predominating and there is diffuse bronchial thickening without visible bronchial plugging. Interval new trace symmetric pleural layering effusions. No pneumothorax. There are mild posterior atelectatic changes in the lungs. 3 mm chronic left lower lobe fissural nodule on 5:91 and a 3 mm chronic left lower lobe lateral basal nodule on 5:127 are both unchanged. There are minimal subpleural ground-glass interstitial changes laterally in the right middle and lower lobes. This was not seen previously and could be due to interval new post pneumonic scarring or a low-grade active pneumonitis. Rest of the lungs are generally clear.  No pneumothorax. Upper Abdomen: No acute abnormality. Abdominal aortic and visceral branch vessel atherosclerosis. Musculoskeletal: Bilateral dendritic gynecomastia with no other significant chest wall findings. There is chronic sclerotic change in the left glenoid process and posterolateral right sixth rib, as well as patchy chronic sclerosis in the T11 vertebral body with a mild chronic upper plate compression fracture. No new or acute skeletal abnormality is seen. IMPRESSION: 1. Emphysema with diffuse bronchial thickening. 2. Subpleural ground-glass interstitial changes laterally in the right middle and lower lobe. This could be due to interval post pneumonic scarring new from 2017 or a low-grade active pneumonitis. 3. Interval new trace pleural effusions. 4. Aortic and coronary artery atherosclerosis. 5. Lipomatous hypertrophy of the interatrial septum increased from 2017. 6. 1.5 cm right thyroid nodule. Nonemergent ultrasound follow-up is recommended. 7. Chronic sclerotic change in the left glenoid process, posterolateral right sixth rib, and T11 vertebral body with a mild chronic upper plate W29 compression fracture. No new or acute skeletal abnormality. 8. Bilateral dendritic gynecomastia.  9. No appreciable adenopathy without contrast. 10. Two stable 3 mm left lung nodules.  No new nodules. 11. For lung cancer screening, adhere to Lung-RADS guidelines. Aortic Atherosclerosis (ICD10-I70.0). Electronically Signed   By: Almira Bar M.D.   On: 05/07/2023 22:50   CT Renal Stone Study  Result Date: 05/06/2023 CLINICAL DATA:  Abdominal and flank pain. Stone suspected. Prior history of right nephrectomy for carcinoma, with left hydronephrosis noted on renal ultrasound today. Additional history non-Hodgkin's lymphoma. EXAM: CT ABDOMEN AND PELVIS WITHOUT CONTRAST TECHNIQUE: Multidetector CT imaging of the abdomen and pelvis was performed following the standard protocol without IV contrast. RADIATION DOSE REDUCTION: This exam was performed according to the departmental dose-optimization program which includes automated exposure control, adjustment of the mA and/or kV according to patient size and/or use  of iterative reconstruction technique. COMPARISON:  CT abdomen and pelvis without contrast 10/21/2015, PET-CT 07/29/2016. No intervening studies. FINDINGS: Lower chest: Mild diffuse bronchial thickening. There is a calcified granuloma in the left lower lobe. There are peripheral ground-glass opacities laterally in the right lower and middle lobes which could be post pneumonic scarring or active pneumonitis. No other focal infiltrate is seen. There is mild posterior atelectasis. The cardiac size is normal. There are three-vessel coronary calcifications. There is no pericardial effusion. There is mild subareolar dendritic gynecomastia. Hepatobiliary: The liver, gallbladder and bile ducts are unremarkable without contrast. Pancreas: Partially atrophic, otherwise unremarkable without contrast. Spleen: No focal abnormality.  No splenomegaly. Adrenals/Urinary Tract: Old right nephroadrenalectomy. No mass is seen in the nephrectomy bed. Left kidney demonstrating a 2.2 cm homogeneous thin walled cyst in the outer  midpole, Hounsfield density of 9.5. This was simple on ultrasound. No follow-up imaging is recommended. There is no other contour deforming abnormality. No adrenal mass. There is no urinary stone or obstruction. There was thought to be hydronephrosis on today's renal ultrasound but it is not seen on CT. There is moderate renal sinus lipomatosis which [place] have mimicked the sonographic appearance of hydronephrosis. The bladder is unremarkable for the degree of distention. Stomach/Bowel: No dilatation or wall thickening including of the appendix. 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 There is sigmoid diverticulosis without evidence of acute diverticulitis. Vascular/Lymphatic: There is moderate to heavy aortoiliac calcific plaque, with prior stenting in the left common iliac and external iliac arteries. No adenopathy is seen. Reproductive: Prostatomegaly.  The prostate is 4.7 cm transverse. Other: No abdominal wall hernia or abnormality. No abdominopelvic ascites. Musculoskeletal: There is new mild anterior wedging of the T11 vertebral body with asymmetric sclerosis in the left 2/3 of the vertebral body. Possible this could be due to a sclerotic lesion with pathologic fracture. Bone scintigraphy is recommended. Age of findings is indeterminate but none of this was seen in 2017. There are grossly stable sclerotic lesions measuring 1 cm and 5 mm anteriorly in the L4 vertebral body, most likely due to bone islands. Small scattered sclerotic foci in the sacrum are unchanged. There is osteopenia and degenerative change of the spine. IMPRESSION: 1. No urinary stone or obstruction. There was thought to be hydronephrosis on today's renal ultrasound but it is not seen on CT. There is moderate renal sinus lipomatosis which [place] have mimicked the sonographic appearance of hydronephrosis. 2. Peripheral ground-glass  opacities in the right lower and middle lobes which could be post pneumonic scarring or active pneumonitis. 3. Bronchitis. 4. 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 No bowel obstruction or inflammation. 5. Sigmoid diverticulosis. 6. Prostatomegaly. 7. New mild anterior wedging of the T11 vertebral body with asymmetric sclerosis in the left 2/3 of the vertebral body. Possible this could be due to a sclerotic lesion with pathologic fracture. Bone scintigraphy or MRI recommended. 8. Aortic and coronary artery atherosclerosis. Aortic Atherosclerosis (ICD10-I70.0). Electronically Signed   By: Almira Bar M.D.   On: 05/06/2023 03:26   US RENAL  Result Date: 05/06/2023 CLINICAL DATA:  Acute kidney injury EXAM: RENAL / URINARY TRACT ULTRASOUND COMPLETE COMPARISON:  PET CT 07/29/2016 FINDINGS: Right Kidney: Renal measurements: Prior right nephrectomy = volume:  mL. Left Kidney: Renal measurements: 11 x 5.8 x 4.7 cm = volume: 157 mL. 2.3 cm upper pole  cyst appears simple. No follow-up imaging recommended. Moderate hydronephrosis. Mild cortical thinning. Normal echotexture. Bladder: Appears normal for degree of bladder distention. Other: None. IMPRESSION: Prior right nephrectomy. Moderate left hydronephrosis. Electronically Signed   By: Charlett Nose M.D.   On: 05/06/2023 01:18   DG Chest 2 View  Result Date: 05/06/2023 CLINICAL DATA:  Abnormal labs.  Back pain and itching. EXAM: CHEST - 2 VIEW COMPARISON:  06/01/2017 FINDINGS: Shallow inspiration. Heart size and pulmonary vascularity are normal for technique. Power port type central venous catheter with tip over the low SVC region. No pneumothorax. Suggestion of mild atelectasis in the left lung base. No airspace disease or consolidation. No pleural effusions. No pneumothorax. Mediastinal contours appear intact. Calcification of the aorta. IMPRESSION: Shallow  inspiration with mild atelectasis in the left base. No edema or consolidation. Electronically Signed   By: Burman Nieves M.D.   On: 05/06/2023 00:57     The results of significant diagnostics from this hospitalization (including imaging, microbiology, ancillary and laboratory) are listed below for reference.     Microbiology: No results found for this or any previous visit (from the past 240 hour(s)).   Labs: BNP (last 3 results) Recent Labs    05/06/23 0032  BNP 112.5*   Basic Metabolic Panel: Recent Labs  Lab 05/06/23 0032 05/06/23 0616 05/06/23 0737 05/08/23 0657 05/09/23 0500 05/10/23 0258 05/11/23 0341 05/12/23 0830  NA  --   --    < > 141 142 140 142 144  K  --   --    < > 4.3 4.4 3.9 3.9 3.9  CL  --   --    < > 116* 118* 114* 117* 115*  CO2  --   --    < > 16* 18* 18* 20* 22  GLUCOSE  --   --    < > 81 80 91 90 97  BUN  --   --    < > 38* 28* 24* 22 18  CREATININE  --   --    < > 2.55* 2.07* 2.03* 1.79* 1.67*  CALCIUM  --   --    < > 7.9* 7.8* 7.5* 7.8* 8.0*  MG  --   --   --   --   --  1.2* 2.1 1.7  PHOS 4.3 4.1  --   --   --   --  2.7  --    < > = values in this interval not displayed.   Liver Function Tests: Recent Labs  Lab 05/05/23 1113 05/05/23 2326 05/08/23 0657  AST 22 23 23   ALT 13 16 17   ALKPHOS 147* 135* 110  BILITOT 0.4 0.2* 0.3  PROT 6.6 5.9* 5.0*  ALBUMIN 3.5 2.8* 2.2*   No results for input(s): "LIPASE", "AMYLASE" in the last 168 hours. No results for input(s): "AMMONIA" in the last 168 hours. CBC: Recent Labs  Lab 05/05/23 2206 05/07/23 0528 05/08/23 0657 05/09/23 0750 05/12/23 0830  WBC 8.8 4.9 4.8 5.8 5.1  NEUTROABS 5.1 3.0  --   --   --   HGB 9.5* 8.1* 8.9* 8.2* 7.9*  HCT 29.7* 25.2* 27.5* 25.6* 24.4*  MCV 90.5 88.7 91.1 91.1 91.0  PLT 216 141* 134* 116* 110*   Cardiac Enzymes: No results for input(s): "CKTOTAL", "CKMB", "CKMBINDEX", "TROPONINI" in the last 168 hours. BNP: Invalid input(s): "POCBNP" CBG: No results  for input(s): "GLUCAP" in the last 168 hours. D-Dimer No results for input(s): "DDIMER" in the last  72 hours. Hgb A1c No results for input(s): "HGBA1C" in the last 72 hours. Lipid Profile No results for input(s): "CHOL", "HDL", "LDLCALC", "TRIG", "CHOLHDL", "LDLDIRECT" in the last 72 hours. Thyroid function studies No results for input(s): "TSH", "T4TOTAL", "T3FREE", "THYROIDAB" in the last 72 hours.  Invalid input(s): "FREET3" Anemia work up No results for input(s): "VITAMINB12", "FOLATE", "FERRITIN", "TIBC", "IRON", "RETICCTPCT" in the last 72 hours. Urinalysis    Component Value Date/Time   COLORURINE YELLOW 05/06/2023 0032   APPEARANCEUR CLEAR 05/06/2023 0032   LABSPEC 1.016 05/06/2023 0032   PHURINE 5.0 05/06/2023 0032   GLUCOSEU NEGATIVE 05/06/2023 0032   HGBUR NEGATIVE 05/06/2023 0032   BILIRUBINUR NEGATIVE 05/06/2023 0032   BILIRUBINUR negative 11/05/2015 1042   KETONESUR NEGATIVE 05/06/2023 0032   PROTEINUR NEGATIVE 05/06/2023 0032   UROBILINOGEN 0.2 11/05/2015 1042   UROBILINOGEN 0.2 06/24/2013 1312   NITRITE NEGATIVE 05/06/2023 0032   LEUKOCYTESUR NEGATIVE 05/06/2023 0032   Sepsis Labs Recent Labs  Lab 05/07/23 0528 05/08/23 0657 05/09/23 0750 05/12/23 0830  WBC 4.9 4.8 5.8 5.1   Microbiology No results found for this or any previous visit (from the past 240 hour(s)).   Time coordinating discharge:  I have spent 35 minutes face to face with the patient and on the ward discussing the patients care, assessment, plan and disposition with other care givers. >50% of the time was devoted counseling the patient about the risks and benefits of treatment/Discharge disposition and coordinating care.   SIGNED:   Miguel Rota, MD  [facility] 05/12/2023, 11:06 AM   If 7PM-7AM, please contact night-coverage

## 2023-05-13 ENCOUNTER — Telehealth: Payer: Self-pay

## 2023-05-13 NOTE — Telephone Encounter (Signed)
Please check with [facility] on [address] as it appears the hydroxyzine 50 mg was ordered by Dr. Nelson Chimes on the 10th.  Same with the carvedilol 3.125 mg, #60 was ordered.

## 2023-05-13 NOTE — Transitions of Care (Post Inpatient/ED Visit) (Signed)
05/13/2023  Name: Gilbert [name] MRN: [id number] DOB: 12-31-1950  Today's TOC FU Call Status: Today's TOC FU Call Status:: Successful TOC FU Call Completed TOC FU Call Complete Date: 05/13/23 Patient's Name and Date of Birth confirmed.  Transition Care Management Follow-up Telephone Call Date of Discharge: 05/12/23 Discharge Facility: [facility]) Type of Discharge: Inpatient Admission Primary Inpatient Discharge Diagnosis:: "acute renal failure" How have you been since you were released from the hospital?: Better (Pt states he slept good, eating good, walking with walker. Denies any pain or acute sxs at present.) Any questions or concerns?: Yes Patient Questions/Concerns:: pt wanting to know why his Lasix was stopped Patient Questions/Concerns Addressed: Other: (Reviewed with pt MD notes regarding med and why it was stopped/on hold for now-advised to discuss further with provider during appt)  Items Reviewed: Did you receive and understand the discharge instructions provided?: Yes Medications obtained,verified, and reconciled?: Yes (Medications Reviewed) (confirmed with pt that daughter went to pick up new meds from pharmacy and was able to get them-he has since taken a dose of both new meds) Any new allergies since your discharge?: No Dietary orders reviewed?: Yes Type of Diet Ordered:: low salt/heart healthy Do you have support at home?: Yes People in Home: child(ren), adult Name of Support/Comfort Primary Source: daughters  Medications Reviewed Today: Medications Reviewed Today     Reviewed by Charlyn Minerva, RN (Registered Nurse) on 05/13/23 at 1414  Med List Status: <None>   Medication Order Taking? Sig Documenting Provider Last Dose Status Informant  acetaminophen (TYLENOL) 500 MG tablet [id number] Yes Take 1,000 mg by mouth daily. [provider] Taking Active Self, Pharmacy Records  APPLE CIDER VINEGAR PO [id number] Yes Take 480 mg by mouth daily.  [provider] Taking Active Self, Pharmacy Records  carvedilol (COREG) 3.125 MG tablet [id number] Yes Take 1 tablet (3.125 mg total) by mouth 2 (two) times daily with a meal. Amin, Ankit C, MD Taking Active   clopidogrel (PLAVIX) 75 MG tablet [id number] Yes Take 1 tablet (75 mg total) by mouth daily. Yates Decamp, MD Taking Active Self, Pharmacy Records  Cyanocobalamin (B-12) 2500 MCG TABS [id number] Yes Take 2,500 mcg by mouth every morning. [provider] Taking Active Self, Pharmacy Records  ezetimibe (ZETIA) 10 MG tablet [id number] Yes Take 1 tablet (10 mg total) by mouth daily. Yates Decamp, MD Taking Active Self, Pharmacy Records  famotidine (PEPCID) 20 MG tablet [id number] Yes Take 20 mg by mouth 2 (two) times daily. [provider] Taking Active Self, Pharmacy Records  hydrOXYzine (ATARAX) 50 MG tablet [id number] Yes Take 1 tablet (50 mg total) by mouth 3 (three) times daily as needed for itching. Miguel Rota, MD Taking Active   isosorbide mononitrate (IMDUR) 60 MG 24 hr tablet [id number] Yes Take 1 tablet (60 mg total) by mouth daily. Yates Decamp, MD Taking Active Self, Pharmacy Records  phenytoin (DILANTIN) 100 MG ER capsule [id number] Yes Take 3 capsules (300 mg total) by mouth daily. Shade Flood, MD Taking Active Self, Pharmacy Records  pyridOXINE (VITAMIN B-6) 100 MG tablet [id number] Yes Take 100 mg by mouth every morning.  [provider] Taking Active Self, Pharmacy Records  rosuvastatin (CRESTOR) 40 MG tablet [id number] Yes Take 1 tablet (40 mg total) by mouth daily. Shade Flood, MD Taking Active Self, Pharmacy Records  Thiamine HCl (VITAMIN B-1) 250 MG tablet [id number] Yes Take 250 mg by mouth every morning.  [provider] Taking Active Self, Pharmacy Records  Home Care and Equipment/Supplies: Were Home Health Services Ordered?: Yes Name of Home Health Agency:: [facility] Has Agency set up a time to come to your home?: No  (advised pt that agency would contact within 24-48hrs-advised pt to f/u if he has not heard from them within that time frame-confirmed he has agency contact info on d/c papers) Any new equipment or medical supplies ordered?: No  Functional Questionnaire: Do you need assistance with bathing/showering or dressing?: No Do you need assistance with meal preparation?: No Do you need assistance with eating?: No Do you have difficulty maintaining continence: No Do you need assistance with getting out of bed/getting out of a chair/moving?: No Do you have difficulty managing or taking your medications?: No  Follow up appointments reviewed: PCP Follow-up appointment confirmed?: Yes Date of PCP follow-up appointment?: 05/18/23 Follow-up Provider: Dr. Neva [facility] Follow-up appointment confirmed?: NA Do you need transportation to your follow-up appointment?: No (pt states his daughter will take him to appts) Do you understand care options if your condition(s) worsen?: Yes-patient verbalized understanding  SDOH Interventions Today    Flowsheet Row Most Recent Value  SDOH Interventions   Food Insecurity Interventions Other (Comment)  [pt was given list of resources from inpt CM team on d/c papers]  Transportation Interventions Intervention Not Indicated      Interventions Today    Flowsheet Row Most Recent Value  General Interventions   General Interventions Discussed/Reviewed General Interventions Discussed, Doctor Visits, Referral to Nurse  [pt declined-states he will contact RN CM if he has any needs or concerns-contact info provided]  Doctor Visits Discussed/Reviewed Specialist, PCP, Doctor Visits Discussed  [discussed with pt how to obtain another dermatlogist as the one he has an appt with not available until March 2025]  Education Interventions   Education Provided Provided Education  Provided Verbal Education On Nutrition, Other, When to see the doctor, Medication  [sx  mgmt]  Nutrition Interventions   Nutrition Discussed/Reviewed Nutrition Discussed, Fluid intake  Pharmacy Interventions   Pharmacy Dicussed/Reviewed Pharmacy Topics Discussed, Medications and their functions  Safety Interventions   Safety Discussed/Reviewed Safety Discussed, Fall Risk, Home Safety  Home Safety Assistive Devices  [pt voices he uses walker for ambulation, has stationary bike in the home and uses it almost daily]      TOC Interventions Today    Flowsheet Row Most Recent Value  TOC Interventions   TOC Interventions Discussed/Reviewed TOC Interventions Discussed, Arranged PCP follow up within 7 days/Care Guide scheduled       Antionette Fairy, RN,BSN,CCM RN Care Manager Transitions of Care  [facility]  Direct Phone: [phone] Toll Free: [phone] Fax: [phone]

## 2023-05-13 NOTE — Telephone Encounter (Signed)
Would you be willing to fill these meds for this patient given Hospital instructions

## 2023-05-15 LAB — PROTEIN ELECTROPHORESIS, SERUM
A/G Ratio: 1.1 (ref 0.7–1.7)
Albumin ELP: 2.5 g/dL — ABNORMAL LOW (ref 2.9–4.4)
Alpha-1-Globulin: 0.2 g/dL (ref 0.0–0.4)
Alpha-2-Globulin: 0.8 g/dL (ref 0.4–1.0)
Beta Globulin: 0.6 g/dL — ABNORMAL LOW (ref 0.7–1.3)
Gamma Globulin: 0.6 g/dL (ref 0.4–1.8)
Globulin, Total: 2.2 g/dL (ref 2.2–3.9)
Total Protein ELP: 4.7 g/dL — ABNORMAL LOW (ref 6.0–8.5)

## 2023-05-18 ENCOUNTER — Ambulatory Visit: Payer: Medicare Other | Admitting: Family Medicine

## 2023-05-18 VITALS — BP 118/68 | HR 76 | Temp 97.8°F | Ht 67.0 in | Wt 169.0 lb

## 2023-05-18 DIAGNOSIS — D649 Anemia, unspecified: Secondary | ICD-10-CM

## 2023-05-18 DIAGNOSIS — C8330 Diffuse large B-cell lymphoma, unspecified site: Secondary | ICD-10-CM

## 2023-05-18 DIAGNOSIS — N183 Chronic kidney disease, stage 3 unspecified: Secondary | ICD-10-CM

## 2023-05-18 DIAGNOSIS — Z9181 History of falling: Secondary | ICD-10-CM

## 2023-05-18 DIAGNOSIS — N179 Acute kidney failure, unspecified: Secondary | ICD-10-CM

## 2023-05-18 DIAGNOSIS — S22080D Wedge compression fracture of T11-T12 vertebra, subsequent encounter for fracture with routine healing: Secondary | ICD-10-CM | POA: Diagnosis not present

## 2023-05-18 DIAGNOSIS — I509 Heart failure, unspecified: Secondary | ICD-10-CM

## 2023-05-18 DIAGNOSIS — L509 Urticaria, unspecified: Secondary | ICD-10-CM

## 2023-05-18 LAB — BASIC METABOLIC PANEL
BUN: 17 mg/dL (ref 6–23)
CO2: 22 meq/L (ref 19–32)
Calcium: 8.6 mg/dL (ref 8.4–10.5)
Chloride: 110 meq/L (ref 96–112)
Creatinine, Ser: 1.84 mg/dL — ABNORMAL HIGH (ref 0.40–1.50)
GFR: 36.16 mL/min — ABNORMAL LOW (ref >60.00)
Glucose, Bld: 79 mg/dL (ref 70–99)
Potassium: 4.6 meq/L (ref 3.5–5.1)
Sodium: 143 meq/L (ref 135–145)

## 2023-05-18 LAB — CBC
HCT: 26.7 % — ABNORMAL LOW (ref 39.0–52.0)
Hemoglobin: 8.9 g/dL — ABNORMAL LOW (ref 13.0–17.0)
MCHC: 33.3 g/dL (ref 30.0–36.0)
MCV: 88.2 fL (ref 78.0–100.0)
Platelets: 284 10*3/uL (ref 150.0–400.0)
RBC: 3.03 Mil/uL — ABNORMAL LOW (ref 4.22–5.81)
RDW: 14.4 % (ref 11.5–15.5)
WBC: 10.6 10*3/uL — ABNORMAL HIGH (ref 4.0–10.5)

## 2023-05-18 LAB — MAGNESIUM: Magnesium: 1.3 mg/dL — ABNORMAL LOW (ref 1.5–2.5)

## 2023-05-18 NOTE — Progress Notes (Signed)
Subjective:  Patient ID: Gilbert [name], male    DOB: 10/05/1950  Age: 72 y.o. MRN: [id number]  CC:  Chief Complaint  Patient presents with   Medical Management of Chronic Issues    Hospital follow up ( kidney failure). Skin issue still (itchiness) Want refill of hydroxyzine.    HPI Gilbert [name] presents for  Transition of care visit.  Transition of care call noted from 05/13/2023.  Improving, question regarding Lasix noted.  No other concerns identified. Here with dtr today.   He was admitted October 3 through October 10, primarily with acute kidney injury on chronic kidney disease.  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  Creatinine 4.31, up from prior reading of 1.42 in August 2023, also noted to be hyperkalemic with potassium 5.7, Acidotic with CO2 of 17.  When seen in the ER he was found to be in acute kidney injury, nephrology consulted.  Had noted 30 pound weight loss over the previous 3 to 4 months.  His Entresto and hydrochlorothiazide were discontinued and Carvedilol dosage was reduced.  Possible decreased need for medication as contributor.  Creatinine improved to near baseline of 1.5.   Drinking water, urinating normally. Has meds from hospital. Off lasix. No new swelling.   Combined heart failure HFrEF, HFpEF and hypertension.  Echo in April with EF of 40% with moderate concentric hypertrophy.  His Entresto and hydrochlorothiazide was held due to condition above, carvedilol continued at lower doseNow on carvedilol 3.125 mg twice daily. No new shortness of breath or chest pain.  Oncology/hematology/itching rash.  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  Weight loss as above and with rash, concern for paraneoplastic cause, hematology/oncology  follow-up planned.  He was referred at his previous visit with me.  He did have autoimmune workup due to nonspecific pruritus and excoriation which was overall negative except slight elevation in ANA.  Hydroxyzine continued for itching.  There was some concern about his self-care, showering.  Home health PT/OT planned.  Concern for possible food insecurity or intake as component of malnutrition and weight loss. - eating 3 meals per day. Eating better since hospital. Some denies food insecurity at this time.  Nephrology - Dr. Marisue Humble. Last visit in 09/2022.  Taking hydroxyzine 3 times per day - has been helpful for itching, but still itching.  Took bath 5 days ago. Oatmeal bath. Steroid cream used some, no lotion used.  Has not heard from home health PT or OT.  Same rash - legs, arms, hands, trunk. No oral lesions, reports that he was told his rash was not related to his acute kidney injury.  Wt Readings from Last 3 Encounters:  05/18/23 169 lb (76.7 kg)  05/05/23 160 lb 12.8 oz (72.9 kg)  02/24/23 168 lb 3.2 oz (76.3 kg)     T11 compression fracture Noted on imaging in hospital. Question of pathologic fracture - bone scan recommended. Osteopenia noted. No back pain.  2 falls since he has been home - no injury with either fall. Wheel of walker caught grass and slippery shoe on other episode. Hematology follow up planned - has not received appt yet.   Anemia Baseline hemoglobin around 10, stable during hospitalization. Lab Results  Component Value Date   WBC 5.1 05/12/2023   HGB 7.9 (L) 05/12/2023  HCT 24.4 (L) 05/12/2023   MCV 91.0 05/12/2023   PLT 110 (L) 05/12/2023     He was continued on Plavix and Zetia with history of CAD and PVD.  Continued on Dilantin for history of seizure disorder. Hypomagnesemia was repleted.  Level of 1.2 on 05/10/2023, 1.7 on 05/12/23.     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   Hematuria 10/29/2015   Dysphagia, pharyngoesophageal phase    Early satiety    Esophageal stricture    Gastritis and gastroduodenitis    PAD (peripheral artery disease) (HCC) 05/28/2014   Ischemic cardiomyopathy 04/03/2014   History of left heart catheterization (LHC) 02/26/2014   Arterial bleed, intraoperative 02/26/2014   Seizures (HCC) 11/27/2013   Essential hypertension 11/27/2013   Hypomagnesemia 11/27/2013   Coronary artery disease involving native coronary artery of native heart without angina pectoris 11/26/2013   Renal cancer (HCC) 10/19/2013   Coronary artery calcification 09/25/2013   Renal cyst 08/30/2013   Loss of weight 08/03/2013   Past Medical History:  Diagnosis Date   Abnormal liver enzymes    Chronic alkaline phosphatase elevation since 2014   Acid reflux    CAD (coronary artery disease)    Cancer (HCC) 08/2013   right kidney cancer   Cancer (HCC)    Coronary artery calcification seen on CAT scan    Coronary atherosclerosis of native coronary artery    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 Used Impella  Circulatory assist device. EF= 20-25%   History of epilepsy    at least 10 years ago. 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 EF 45% 04/10/2014   Therapeutic drug monitoring    Past Surgical History:  Procedure Laterality Date   BALLOON DILATION N/A 01/21/2015   Procedure: BALLOON DILATION;  Surgeon: Iva Boop, MD;  Location: [facility] ENDOSCOPY;  Service: Endoscopy;  Laterality: N/A;   CARDIAC CATHETERIZATION N/A 06/10/2015   Procedure: Left Heart Cath and Coronary Angiography;  Surgeon: Yates Decamp, MD;  Location: [facility] INVASIVE CV LAB;  Service:  Cardiovascular;  Laterality: N/A;   CORONARY ANGIOPLASTY WITH STENT PLACEMENT  08/02/2013   Left main coronary artery stenting on emergent basis along with circulatory support with Impella device through left leg. With 4.0 x 18 mm Xience alpine stent   EAR CYST EXCISION Left 05/31/2018   Procedure: EXCISION LEFT POSTERIOR EAR TUMOR;  Surgeon: Newman Pies, MD;  Location: [facility] OR;  Service: ENT;  Laterality: Left;   ESOPHAGOGASTRODUODENOSCOPY     ESOPHAGOGASTRODUODENOSCOPY N/A 01/21/2015   Procedure: ESOPHAGOGASTRODUODENOSCOPY (EGD) with possible dilation.;  Surgeon: Iva Boop, MD;  Location: [facility] ENDOSCOPY;  Service: Endoscopy;  Laterality: N/A;   EYE SURGERY Left 2 weeks ago   torn retina   ILIAC ARTERY STENT Left 05/28/2014   dr Jacinto Halim   LEFT HEART CATHETERIZATION WITH CORONARY ANGIOGRAM N/A 11/27/2013   Procedure: LEFT HEART CATHETERIZATION WITH CORONARY ANGIOGRAM;  Surgeon: Pamella Pert, MD;   Location: [facility] CATH LAB;  Service: Cardiovascular;  Laterality: N/A;   LOWER EXTREMITY ANGIOGRAM N/A 02/26/2014   Procedure: LOWER EXTREMITY ANGIOGRAM;  Surgeon: Pamella Pert, MD;  Location: [facility] CATH LAB;  Service: Cardiovascular;  Laterality: N/A;   LOWER EXTREMITY ANGIOGRAM N/A 05/28/2014   Procedure: LOWER EXTREMITY ANGIOGRAM;  Surgeon: Pamella Pert, MD;  Location: [facility] CATH LAB;  Service: Cardiovascular;  Laterality: N/A;   NEPHRECTOMY  11/30/2013   PERCUTANEOUS CORONARY STENT INTERVENTION (PCI-S)  11/27/2013   Procedure: PERCUTANEOUS CORONARY STENT INTERVENTION (PCI-S);  Surgeon: Pamella Pert, MD;  Location: [facility] CATH LAB;  Service: Cardiovascular;;   RIGHT/LEFT HEART CATH AND CORONARY ANGIOGRAPHY N/A 03/23/2022   Procedure: RIGHT/LEFT HEART CATH AND CORONARY ANGIOGRAPHY;  Surgeon: Yates Decamp, MD;  Location: [facility] INVASIVE CV LAB;  Service: Cardiovascular;  Laterality: N/A;   ROBOT ASSISTED LAPAROSCOPIC NEPHRECTOMY Right 10/19/2013   Procedure: ROBOTIC ASSISTED LAPAROSCOPIC NEPHRECTOMY,  EXTENSIVE ADHESIOLYSIS;  Surgeon: Sebastian Ache, MD;  Location: [facility] ORS;  Service: Urology;  Laterality: Right;   SKIN FULL THICKNESS GRAFT Left 05/31/2018   Procedure: SKIN GRAFT FULL THICKNESS FROM LEFT ABDOMEN TO LEFT EAR;  Surgeon: Newman Pies, MD;  Location: [facility] OR;  Service: ENT;  Laterality: Left;   Allergies  Allergen Reactions   Oxycodone Swelling and Other (See Comments)    Tongue and lips swell   Prior to Admission medications   Medication Sig Start Date End Date Taking? Authorizing Provider  acetaminophen (TYLENOL) 500 MG tablet Take 1,000 mg by mouth daily.   Yes [provider]  APPLE CIDER VINEGAR PO Take 480 mg by mouth daily.   Yes [provider]  carvedilol (COREG) 3.125 MG tablet Take 1 tablet (3.125 mg total) by mouth 2 (two) times daily with a meal. 05/12/23  Yes Amin, Ankit C, MD  clopidogrel (PLAVIX) 75 MG tablet Take 1 tablet (75 mg total) by mouth daily.  10/06/22  Yes Yates Decamp, MD  Cyanocobalamin (B-12) 2500 MCG TABS Take 2,500 mcg by mouth every morning.   Yes [provider]  ezetimibe (ZETIA) 10 MG tablet Take 1 tablet (10 mg total) by mouth daily. 10/06/22  Yes Yates Decamp, MD  famotidine (PEPCID) 20 MG tablet Take 20 mg by mouth 2 (two) times daily.   Yes [provider]  hydrOXYzine (ATARAX) 50 MG tablet Take 1 tablet (50 mg total) by mouth 3 (three) times daily as needed for itching. 05/12/23  Yes Amin, Ankit C, MD  isosorbide mononitrate (IMDUR) 60 MG 24 hr tablet Take 1 tablet (60 mg total) by mouth daily. 10/06/22 10/01/23 Yes  Yates Decamp, MD  phenytoin (DILANTIN) 100 MG ER capsule Take 3 capsules (300 mg total) by mouth daily. 05/05/23  Yes Shade Flood, MD  pyridOXINE (VITAMIN B-6) 100 MG tablet Take 100 mg by mouth every morning.    Yes [provider]  rosuvastatin (CRESTOR) 40 MG tablet Take 1 tablet (40 mg total) by mouth daily. Patient taking differently: Take 40 mg by mouth daily. Cardiologist provider want it change to 10 mg 05/05/23 04/29/24 Yes Shade Flood, MD  Thiamine HCl (VITAMIN B-1) 250 MG tablet Take 250 mg by mouth every morning.    Yes [provider]   Social History   Socioeconomic History   Marital status: Divorced    Spouse name: Not on file   Number of children: 2   Years of education: Not on file   Highest education level: Not on file  Occupational History   Occupation: retired  Tobacco Use   Smoking status: Former    Current packs/day: 0.00    Average packs/day: 2.0 packs/day for 45.0 years (90.0 ttl pk-yrs)    Types: Cigarettes    Start date: 08/02/1958    Quit date: 08/03/2003    Years since quitting: 19.8   Smokeless tobacco: Current    Types: Chew   Tobacco comments:    quit  smoking 10 years agop  Vaping Use   Vaping status: Never Used  Substance and Sexual Activity   Alcohol use: No    Alcohol/week: 0.0 standard drinks of alcohol    Comment: quit 1980    Drug use: No    Comment: none in past 30 years    Sexual activity: Not Currently  Other Topics Concern   Not on file  Social History Narrative   ** Merged History Encounter **       Social Determinants of Health   Financial Resource Strain: Low Risk  (03/11/2022)   Overall Financial Resource Strain (CARDIA)    Difficulty of Paying Living Expenses: Not hard at all  Food Insecurity: Food Insecurity Present (05/13/2023)   Hunger Vital Sign    Worried About Running Out of Food in the Last Year: Sometimes true    Ran Out of Food in the Last Year: Sometimes true  Transportation Needs: No Transportation Needs (05/13/2023)   PRAPARE - Administrator, Civil Service (Medical): No    Lack of Transportation (Non-Medical): No  Physical Activity: Insufficiently Active (03/11/2022)   Exercise Vital Sign    Days of Exercise per Week: 3 days    Minutes of Exercise per Session: 30 min  Stress: No Stress Concern Present (03/11/2022)   [employer] of Occupational Health - Occupational Stress Questionnaire    Feeling of Stress : Not at all  Social Connections: Socially Isolated (03/11/2022)   Social Connection and Isolation Panel [NHANES]    Frequency of Communication with Friends and Family: Three times a week    Frequency of Social Gatherings with Friends and Family: Three times a week    Attends Religious Services: Never    Active Member of Clubs or Organizations: No    Attends Banker Meetings: Never    Marital Status: Divorced  Catering manager Violence: Not At Risk (05/06/2023)   Humiliation, Afraid, Rape, and Kick questionnaire    Fear of Current or Ex-Partner: No    Emotionally Abused: No    Physically Abused: No    Sexually Abused: No    Review of Systems Per HPI.  Objective:   Vitals:   05/18/23 0955  BP: 118/68  Pulse: 76  Temp: 97.8 F (36.6 C)  TempSrc: Temporal  SpO2: 96%  Weight: 169 lb (76.7 kg)  Height: 5\' 7"  (1.702 m)     Physical  Exam Vitals reviewed.  Constitutional:      General: He is not in acute distress.    Appearance: He is well-developed. He is not diaphoretic.  HENT:     Head: Normocephalic and atraumatic.  Neck:     Vascular: No carotid bruit or JVD.  Cardiovascular:     Rate and Rhythm: Normal rate and regular rhythm.     Heart sounds: Normal heart sounds. No murmur heard. Pulmonary:     Effort: Pulmonary effort is normal.     Breath sounds: Normal breath sounds. No rales.  Musculoskeletal:     Right lower leg: No edema.     Left lower leg: No edema.  Skin:    General: Skin is warm and dry.     Findings: Rash (diffuse - see photo.) present.  Neurological:     Mental Status: He is alert and oriented to person, place, and time.  Psychiatric:        Mood and Affect: Mood normal.           Assessment & Plan:  Gilbert [name] is a 72 y.o. male . Urticaria - Plan: Ambulatory referral to Dermatology  -Persistent rash, urticaria.  Less likely due to uremia as renal function has improved, was advised in the hospital that his rash was not renal cause.  Still possible paraneoplastic rash versus combination of the urticaria in part due to dry skin, decreased bathing as above.  Has not been using any hydrating lotion.  -Continue hydroxyzine -potential side effects including fall precautions discussed of higher dose.  -Repeat referral to dermatology, current appointment is a month out, will try to have him seen sooner.  -Dry skin care reviewed with bathing every 2 to 3 days, hydrating soap such as Dove or Aveeno, followed by moisturizing lotion after bathing, options given.  Lotion throughout the day as needed for itching areas.  Topical steroid for focal itching, and recheck in the next 2 weeks.  AKI (acute kidney injury) (HCC) - Plan: Basic metabolic panel, Ambulatory referral to Home Health  -Improving with adjustment of meds above.  Some debility with falls as above, use of walker.  See info below.   Check updated renal function, home health referral placed.  Stage 3 chronic kidney disease, unspecified whether stage 3a or 3b CKD (HCC)  -Updated renal function ordered  Diffuse large cell non-Hodgkin's lymphoma (HCC) - Plan: CBC, Ambulatory referral to Home Health  -Phone number provided for hematology, previously referred.  Potentially [place] need bone scan for abnormal area seen with imaging around T11 vertebral compression fracture but hematology Faeth want other specific testing and will hold on specific imaging at this time.  Coordination of care with hematology.  Anemia, unspecified type - Plan: CBC  -Check updated labs and adjust plan accordingly  History of fall - Plan: Ambulatory referral to Home Health Closed wedge compression fracture of T11 vertebra with routine healing, subsequent encounter - Plan: Ambulatory referral to Home Health  -Above will refer to home health for home safety, PT for strengthening, and assessment of home needs including shower chair or other assistive devices.  Can then order those as needed.  Hypomagnesemia - Plan: Magnesium  -Check updated labs and replete as necessary  Chronic  heart failure, unspecified heart failure type (HCC)  -Appears euvolemic, tolerating change in medicines.  ER precautions given, recheck 2 weeks.  No orders of the defined types were placed in this encounter.  Patient Instructions  Bathing every 2-3 days, with Aveeno or Dove for men are good options. Apply lotion 1-2 times per day at a minimum. Aveeno is an option - this is very important.  I will try to have you seen sooner with dermatology. No change in meds for now. I will let you know if other changes recommended.  Please call hematology for appointment - we have referred you.  Dr. Marisue Humble - please call for follow up appointment (kidney specialist).  I will refer you to home health PT and OT, and will look into shower chair order as well.  See info on fall prevention at home.    Recheck in 2 weeks.  Return to the clinic or go to the nearest emergency room if any of your symptoms worsen or new symptoms occur.  Fall Prevention in the Home, Adult Falls can cause injuries and affect people of all ages. There are many simple things that you can do to make your home safe and to help prevent falls. If you need it, ask for help making these changes. What actions can I take to prevent falls? General information Use good lighting in all rooms. Make sure to: Replace any light bulbs that burn out. Turn on lights if it is dark and use night-lights. Keep items that you use often in easy-to-reach places. Lower the shelves around your home if needed. Move furniture so that there are clear paths around it. Do not keep throw rugs or other things on the floor that can make you trip. If any of your floors are uneven, fix them. Add color or contrast paint or tape to clearly mark and help you see: Grab bars or handrails. First and last steps of staircases. Where the edge of each step is. If you use a ladder or stepladder: Make sure that it is fully opened. Do not climb a closed ladder. Make sure the sides of the ladder are locked in place. Have someone hold the ladder while you use it. Know where your pets are as you move through your home. What can I do in the bathroom?     Keep the floor dry. Clean up any water that is on the floor right away. Remove soap buildup in the bathtub or shower. Buildup makes bathtubs and showers slippery. Use non-skid mats or decals on the floor of the bathtub or shower. Attach bath mats securely with double-sided, non-slip rug tape. If you need to sit down while you are in the shower, use a non-slip stool. Install grab bars by the toilet and in the bathtub and shower. Do not use towel bars as grab bars. What can I do in the bedroom? Make sure that you have a light by your bed that is easy to reach. Do not use any sheets or blankets on your  bed that hang to the floor. Have a firm bench or chair with side arms that you can use for support when you get dressed. What can I do in the kitchen? Clean up any spills right away. If you need to reach something above you, use a sturdy step stool that has a grab bar. Keep electrical cables out of the way. Do not use floor polish or wax that makes floors slippery. What can I do with my  stairs? Do not leave anything on the stairs. Make sure that you have a light switch at the top and the bottom of the stairs. Have them installed if you do not have them. Make sure that there are handrails on both sides of the stairs. Fix handrails that are broken or loose. Make sure that handrails are as long as the staircases. Install non-slip stair treads on all stairs in your home if they do not have carpet. Avoid having throw rugs at the top or bottom of stairs, or secure the rugs with carpet tape to prevent them from moving. Choose a carpet design that does not hide the edge of steps on the stairs. Make sure that carpet is firmly attached to the stairs. Fix any carpet that is loose or worn. What can I do on the outside of my home? Use bright outdoor lighting. Repair the edges of walkways and driveways and fix any cracks. Clear paths of anything that can make you trip, such as tools or rocks. Add color or contrast paint or tape to clearly mark and help you see high doorway thresholds. Trim any bushes or trees on the main path into your home. Check that handrails are securely fastened and in good repair. Both sides of all steps should have handrails. Install guardrails along the edges of any raised decks or porches. Have leaves, snow, and ice cleared regularly. Use sand, salt, or ice melt on walkways during winter months if you live where there is ice and snow. In the garage, clean up any spills right away, including grease or oil spills. What other actions can I take? Review your medicines with your health  care provider. Some medicines can make you confused or feel dizzy. This can increase your chance of falling. Wear closed-toe shoes that fit well and support your feet. Wear shoes that have rubber soles and low heels. Use a cane, walker, scooter, or crutches that help you move around if needed. Talk with your provider about other ways that you can decrease your risk of falls. This [place] include seeing a physical therapist to learn to do exercises to improve movement and strength. Where to find more information Centers for Disease Control and Prevention, STEADI: TonerPromos.no [employer] on Aging: BaseRingTones.pl National Institute on Aging: BaseRingTones.pl Contact a health care provider if: You are afraid of falling at home. You feel weak, drowsy, or dizzy at home. You fall at home. Get help right away if you: Lose consciousness or have trouble moving after a fall. Have a fall that causes a head injury. These symptoms [place] be an emergency. Get help right away. Call 911. Do not wait to see if the symptoms will go away. Do not drive yourself to the hospital. This information is not intended to replace advice given to you by your health care provider. Make sure you discuss any questions you have with your health care provider. Document Revised: 03/22/2022 Document Reviewed: 03/22/2022 Elsevier Patient Education  2024 Elsevier Inc.    Dry Skin Care  What causes dry skin?  Dry skin is common and results from inadequate moisture in the outer skin layers. Dry skin usually results from the excessive loss of moisture from the skin surface. This occurs due to two major factors: Normally the skin's oil glands deposit a layer of oil on the skin's surface. This layer of oil prevents the loss of moisture from the skin. Exposure to soaps, cleaners, solvents, and disinfectants removes this oily film, allowing water to escape.  Water loss from the skin increases when the humidity is low. During winter months we  spend a lot of time indoors where the air is heated. Heated air has very low humidity. This also contributes to dry skin.  A tendency for dry skin [place] accompany such disorders as eczema. Also, as people age, the number of functioning oil glands decreases, and the tendency toward dry skin can be a sensation of skin tightness when emerging from the shower.  How do I manage dry skin?  Humidify your environment. This can be accomplished by using a humidifier in your bedroom at night during winter months. Bathing can actually put moisture back into your skin if done right. Take the following steps while bathing to sooth dry skin: Avoid hot water, which only dries the skin and makes itching worse. Use warm water. Avoid washcloths or extensive rubbing or scrubbing. Use mild soaps like unscented Dove, Oil of Olay, Cetaphil, Basis, or CeraVe. If you take baths rather than showers, rinse off soap residue with clean water before getting out of tub. Once out of the shower/tub, pat dry gently with a soft towel. Leave your skin damp. While still damp, apply any medicated ointment/cream you were prescribed to the affected areas. After you apply your medicated ointment/cream, then apply your moisturizer to your whole body.This is the most important step in dry skin care. If this is omitted, your skin will continue to be dry. The choice of moisturizer is also very important. In general, lotion will not provider enough moisture to severely dry skin because it is water based. You should use an ointment or cream. Moisturizers should also be unscented. Good choices include Vaseline (plain petrolatum), Aquaphor, Cetaphil, CeraVe, Vanicream, DML Forte, Aveeno moisture, or Eucerin Cream. Bath oils can be helpful, but do not replace the application of moisturizer after the bath. In addition, they make the tub slippery causing an increased risk for falls. Therefore, we do not recommend their use.       Signed,    Meredith Staggers, MD [facility], [facility] 05/18/23 10:00 AM

## 2023-05-18 NOTE — Patient Instructions (Addendum)
Bathing every 2-3 days, with Aveeno or Dove for men are good options. Apply lotion 1-2 times per day at a minimum. Aveeno is an option - this is very important.  I will try to have you seen sooner with dermatology. No change in meds for now. I will let you know if other changes recommended.  Please call hematology for appointment - we have referred you.  Dr. Marisue Humble - please call for follow up appointment (kidney specialist).  I will refer you to home health PT and OT, and will look into shower chair order as well.  See info on fall prevention at home.   Recheck in 2 weeks.  Return to the clinic or go to the nearest emergency room if any of your symptoms worsen or new symptoms occur.  Fall Prevention in the Home, Adult Falls can cause injuries and affect people of all ages. There are many simple things that you can do to make your home safe and to help prevent falls. If you need it, ask for help making these changes. What actions can I take to prevent falls? General information Use good lighting in all rooms. Make sure to: Replace any light bulbs that burn out. Turn on lights if it is dark and use night-lights. Keep items that you use often in easy-to-reach places. Lower the shelves around your home if needed. Move furniture so that there are clear paths around it. Do not keep throw rugs or other things on the floor that can make you trip. If any of your floors are uneven, fix them. Add color or contrast paint or tape to clearly mark and help you see: Grab bars or handrails. First and last steps of staircases. Where the edge of each step is. If you use a ladder or stepladder: Make sure that it is fully opened. Do not climb a closed ladder. Make sure the sides of the ladder are locked in place. Have someone hold the ladder while you use it. Know where your pets are as you move through your home. What can I do in the bathroom?     Keep the floor dry. Clean up any water that is on the  floor right away. Remove soap buildup in the bathtub or shower. Buildup makes bathtubs and showers slippery. Use non-skid mats or decals on the floor of the bathtub or shower. Attach bath mats securely with double-sided, non-slip rug tape. If you need to sit down while you are in the shower, use a non-slip stool. Install grab bars by the toilet and in the bathtub and shower. Do not use towel bars as grab bars. What can I do in the bedroom? Make sure that you have a light by your bed that is easy to reach. Do not use any sheets or blankets on your bed that hang to the floor. Have a firm bench or chair with side arms that you can use for support when you get dressed. What can I do in the kitchen? Clean up any spills right away. If you need to reach something above you, use a sturdy step stool that has a grab bar. Keep electrical cables out of the way. Do not use floor polish or wax that makes floors slippery. What can I do with my stairs? Do not leave anything on the stairs. Make sure that you have a light switch at the top and the bottom of the stairs. Have them installed if you do not have them. Make sure that there  are handrails on both sides of the stairs. Fix handrails that are broken or loose. Make sure that handrails are as long as the staircases. Install non-slip stair treads on all stairs in your home if they do not have carpet. Avoid having throw rugs at the top or bottom of stairs, or secure the rugs with carpet tape to prevent them from moving. Choose a carpet design that does not hide the edge of steps on the stairs. Make sure that carpet is firmly attached to the stairs. Fix any carpet that is loose or worn. What can I do on the outside of my home? Use bright outdoor lighting. Repair the edges of walkways and driveways and fix any cracks. Clear paths of anything that can make you trip, such as tools or rocks. Add color or contrast paint or tape to clearly mark and help you see  high doorway thresholds. Trim any bushes or trees on the main path into your home. Check that handrails are securely fastened and in good repair. Both sides of all steps should have handrails. Install guardrails along the edges of any raised decks or porches. Have leaves, snow, and ice cleared regularly. Use sand, salt, or ice melt on walkways during winter months if you live where there is ice and snow. In the garage, clean up any spills right away, including grease or oil spills. What other actions can I take? Review your medicines with your health care provider. Some medicines can make you confused or feel dizzy. This can increase your chance of falling. Wear closed-toe shoes that fit well and support your feet. Wear shoes that have rubber soles and low heels. Use a cane, walker, scooter, or crutches that help you move around if needed. Talk with your provider about other ways that you can decrease your risk of falls. This Vinsant include seeing a physical therapist to learn to do exercises to improve movement and strength. Where to find more information Centers for Disease Control and Prevention, STEADI: TonerPromos.no [employer] on Aging: BaseRingTones.pl National Institute on Aging: BaseRingTones.pl Contact a health care provider if: You are afraid of falling at home. You feel weak, drowsy, or dizzy at home. You fall at home. Get help right away if you: Lose consciousness or have trouble moving after a fall. Have a fall that causes a head injury. These symptoms Rando be an emergency. Get help right away. Call 911. Do not wait to see if the symptoms will go away. Do not drive yourself to the hospital. This information is not intended to replace advice given to you by your health care provider. Make sure you discuss any questions you have with your health care provider. Document Revised: 03/22/2022 Document Reviewed: 03/22/2022 Elsevier Patient Education  2024 Elsevier Inc.    Dry Skin  Care  What causes dry skin?  Dry skin is common and results from inadequate moisture in the outer skin layers. Dry skin usually results from the excessive loss of moisture from the skin surface. This occurs due to two major factors: Normally the skin's oil glands deposit a layer of oil on the skin's surface. This layer of oil prevents the loss of moisture from the skin. Exposure to soaps, cleaners, solvents, and disinfectants removes this oily film, allowing water to escape. Water loss from the skin increases when the humidity is low. During winter months we spend a lot of time indoors where the air is heated. Heated air has very low humidity. This also contributes to dry  skin.  A tendency for dry skin Giglia accompany such disorders as eczema. Also, as people age, the number of functioning oil glands decreases, and the tendency toward dry skin can be a sensation of skin tightness when emerging from the shower.  How do I manage dry skin?  Humidify your environment. This can be accomplished by using a humidifier in your bedroom at night during winter months. Bathing can actually put moisture back into your skin if done right. Take the following steps while bathing to sooth dry skin: Avoid hot water, which only dries the skin and makes itching worse. Use warm water. Avoid washcloths or extensive rubbing or scrubbing. Use mild soaps like unscented Dove, Oil of Olay, Cetaphil, Basis, or CeraVe. If you take baths rather than showers, rinse off soap residue with clean water before getting out of tub. Once out of the shower/tub, pat dry gently with a soft towel. Leave your skin damp. While still damp, apply any medicated ointment/cream you were prescribed to the affected areas. After you apply your medicated ointment/cream, then apply your moisturizer to your whole body.This is the most important step in dry skin care. If this is omitted, your skin will continue to be dry. The choice of moisturizer is also  very important. In general, lotion will not provider enough moisture to severely dry skin because it is water based. You should use an ointment or cream. Moisturizers should also be unscented. Good choices include Vaseline (plain petrolatum), Aquaphor, Cetaphil, CeraVe, Vanicream, DML Forte, Aveeno moisture, or Eucerin Cream. Bath oils can be helpful, but do not replace the application of moisturizer after the bath. In addition, they make the tub slippery causing an increased risk for falls. Therefore, we do not recommend their use.

## 2023-05-19 ENCOUNTER — Telehealth: Payer: Self-pay | Admitting: Hematology

## 2023-05-19 NOTE — Telephone Encounter (Signed)
Called and spoke with patient who asked that I contact daughter [name] to schedule appointments. Called [name] no answer left message to call office for scheduling.

## 2023-05-20 ENCOUNTER — Telehealth: Payer: Self-pay

## 2023-05-20 ENCOUNTER — Encounter: Payer: Self-pay | Admitting: Family Medicine

## 2023-05-20 DIAGNOSIS — I5032 Chronic diastolic (congestive) heart failure: Secondary | ICD-10-CM | POA: Diagnosis not present

## 2023-05-20 DIAGNOSIS — M4854XD Collapsed vertebra, not elsewhere classified, thoracic region, subsequent encounter for fracture with routine healing: Secondary | ICD-10-CM | POA: Diagnosis not present

## 2023-05-20 DIAGNOSIS — C8330 Diffuse large B-cell lymphoma, unspecified site: Secondary | ICD-10-CM | POA: Diagnosis not present

## 2023-05-20 DIAGNOSIS — I5023 Acute on chronic systolic (congestive) heart failure: Secondary | ICD-10-CM | POA: Diagnosis not present

## 2023-05-20 DIAGNOSIS — N1832 Chronic kidney disease, stage 3b: Secondary | ICD-10-CM | POA: Diagnosis not present

## 2023-05-20 DIAGNOSIS — I130 Hypertensive heart and chronic kidney disease with heart failure and stage 1 through stage 4 chronic kidney disease, or unspecified chronic kidney disease: Secondary | ICD-10-CM | POA: Diagnosis not present

## 2023-05-20 NOTE — Telephone Encounter (Signed)
Nurse called back and we discussed his O2 sat no new sxs, no new sortness of breath and notes that itis mostly with activity and once he sits it will retun to 94 avg.  Provided verbal order to nurse for home PT

## 2023-05-20 NOTE — Telephone Encounter (Signed)
Called [employer] ([facility]) for DME supply request, per Gearldine Bienenstock at [place] location his insurance will likely not cover the shower chair but they will attempt to have this covered.  Needs to be sent with order, demographics, insurance card, and last note showing why he needs these items

## 2023-05-20 NOTE — Telephone Encounter (Signed)
Home health called and requested verbal orders to do in home PT  once a week for 1 week, then twice a week for 3 weeks, then back to once a week for 3 weeks.   Also requesting a prescription be sent to DME company for a Rolator with seat as well as a shower chair.   Also needed to make you aware he declined OT evaluation AND his O2 sat is out of range after activity read at 83% as lowest   Call back the Nurse at [phone] to provide orders

## 2023-05-20 NOTE — Telephone Encounter (Signed)
Noted.  O2 sat was normal at his last visit.  CT of chest reviewed from October 5.  Emphysema with diffuse bronchial thickening, subpleural groundglass interstitial changes in the right middle and lower lobe possible postpneumonic scarring new from 2017 or low-grade active pneumonitis and was noted to have trace pleural effusions.  Stable 3 mm left lung nodules.  Nonemergent ultrasound of thyroid also recommended for 1.5 cm right thyroid nodule. History of CHF, but he is not currently on any inhalers or medications for emphysema.  Called nurse at provided number - no answer. Left voicemail to call us back to clarify O2 sat -if he is having any shortness of breath, or low O2 sats at home should be seen in the ER, especially if any new symptoms.  If symptoms are stable since  last visit, recommend seeing me next week and we can discuss the CT results and plan further.  Likely pulmonary eval.  I also attempted to call patient but no answer, left message to call back.  Lab result from last visit also noted.  Creatinine has slightly increased to 1.84, but minimal change from hospitalization.  Should recheck that next week.  See if we can move his appointment up to see me either Monday or Wednesday next week.   Blood counts were overall stable.  Infection fighting cells were borderline.  If any fevers or worsening symptoms should be seen right away.  Magnesium was also low, also noted in the hospital.  Given his decreased kidney function I would like to monitor that for now as only mildly low and there are risks with magnesium supplementation with decreased kidney function.  I will complete prescription for rollator seat and shower chair, and place in fax bin.

## 2023-05-20 NOTE — Telephone Encounter (Signed)
Faxed order to the requested fax number with requested notes and demographics   Fax number [phone] provided by [facility]

## 2023-05-23 ENCOUNTER — Telehealth: Payer: Self-pay

## 2023-05-23 NOTE — Telephone Encounter (Signed)
Sent  to Dr.Greene 

## 2023-05-24 NOTE — Telephone Encounter (Signed)
Please see telephone message from Friday, I completed a paper prescription at that time and it appears it was faxed.  Please check status.

## 2023-05-24 NOTE — Telephone Encounter (Signed)
Left vm for [facility] that we have faxed this twice. If she has any concerns she can give [place] a call.

## 2023-05-24 NOTE — Telephone Encounter (Signed)
I have faxed this again

## 2023-05-25 ENCOUNTER — Other Ambulatory Visit: Payer: Self-pay

## 2023-05-25 ENCOUNTER — Telehealth: Payer: Self-pay | Admitting: Family Medicine

## 2023-05-25 MED ORDER — HYDROXYZINE HCL 50 MG PO TABS: 50.0000 mg | ORAL_TABLET | Freq: Three times a day (TID) | ORAL | 0 refills | Status: DC | PRN

## 2023-05-25 NOTE — Telephone Encounter (Signed)
Noted.  I will refill hydroxyzine for now.   Please check into dermatology appointment, I was trying to have him seen sooner but I do not see the actual appointment date with Dr. Melida Quitter.  If hydroxyzine is not helping or worsening symptoms needs to be seen as we Allers need to consider prednisone.  Also wanted to see him this week if possible because of his shortness of breath, see telephone message from October 18.  It looks like his next appointment is not scheduled until November 13.  Can he be seen sooner?

## 2023-05-25 NOTE — Telephone Encounter (Signed)
Encourage patient to contact the pharmacy for refills or they can request refills through [facility]   WHAT PHARMACY WOULD THEY LIKE THIS SENT TO:  [id number] - [place] ([place]), [place] - [address]       MEDICATION NAME & DOSE: hydrOXYzine (ATARAX) 50 MG tablet   NOTES/COMMENTS FROM PATIENT:      Front office please notify patient: It takes 48-72 hours to process rx refill requests Ask patient to call pharmacy to ensure rx is ready before heading there.

## 2023-05-25 NOTE — Telephone Encounter (Signed)
Called and let patient know a renewal was sent in for another 10 day supply. Patient requesting more be sent as well as something additional to help with the continued skin itching, last seen 05/17/22 for that and notes not much better.  Please advise.

## 2023-05-26 DIAGNOSIS — N1832 Chronic kidney disease, stage 3b: Secondary | ICD-10-CM | POA: Diagnosis not present

## 2023-05-26 DIAGNOSIS — I5023 Acute on chronic systolic (congestive) heart failure: Secondary | ICD-10-CM | POA: Diagnosis not present

## 2023-05-26 DIAGNOSIS — I5032 Chronic diastolic (congestive) heart failure: Secondary | ICD-10-CM | POA: Diagnosis not present

## 2023-05-26 DIAGNOSIS — C8330 Diffuse large B-cell lymphoma, unspecified site: Secondary | ICD-10-CM | POA: Diagnosis not present

## 2023-05-26 DIAGNOSIS — M4854XD Collapsed vertebra, not elsewhere classified, thoracic region, subsequent encounter for fracture with routine healing: Secondary | ICD-10-CM | POA: Diagnosis not present

## 2023-05-26 DIAGNOSIS — I130 Hypertensive heart and chronic kidney disease with heart failure and stage 1 through stage 4 chronic kidney disease, or unspecified chronic kidney disease: Secondary | ICD-10-CM | POA: Diagnosis not present

## 2023-05-26 NOTE — Telephone Encounter (Signed)
Called pt and he asked if his daughter Gilbert [name] could call and arrange this with [place] as she transports him I will wait for her call to get him rescheduled to a sooner date

## 2023-05-26 NOTE — Telephone Encounter (Signed)
If you are okay with using a same day appt slot or a hospital follow up I could have him seen sooner but he was scheduled that far bc that is all you have currently (same days and Hospital f/u)

## 2023-05-26 NOTE — Telephone Encounter (Signed)
Same-day or hospital follow-up is fine since we should be addressing his dyspnea and rash only.  Thanks

## 2023-05-27 DIAGNOSIS — N1832 Chronic kidney disease, stage 3b: Secondary | ICD-10-CM | POA: Diagnosis not present

## 2023-05-27 DIAGNOSIS — M4854XD Collapsed vertebra, not elsewhere classified, thoracic region, subsequent encounter for fracture with routine healing: Secondary | ICD-10-CM | POA: Diagnosis not present

## 2023-05-27 DIAGNOSIS — I130 Hypertensive heart and chronic kidney disease with heart failure and stage 1 through stage 4 chronic kidney disease, or unspecified chronic kidney disease: Secondary | ICD-10-CM | POA: Diagnosis not present

## 2023-05-27 DIAGNOSIS — I5023 Acute on chronic systolic (congestive) heart failure: Secondary | ICD-10-CM | POA: Diagnosis not present

## 2023-05-27 DIAGNOSIS — C8330 Diffuse large B-cell lymphoma, unspecified site: Secondary | ICD-10-CM | POA: Diagnosis not present

## 2023-05-27 DIAGNOSIS — I5032 Chronic diastolic (congestive) heart failure: Secondary | ICD-10-CM | POA: Diagnosis not present

## 2023-05-27 NOTE — Telephone Encounter (Signed)
Called again, no answer LM to have [name] call us and make that appt

## 2023-05-30 NOTE — Telephone Encounter (Signed)
Called and got Gilbert [name] on the phone and we got pt scheduled for Thursday 06/02/23 rather than 06/15/23 Unfortunately while I was speaking with her it was brought to my attention she requested a sooner appt for these concerns and was told the 13th is the soonest available, she requested to be added to a cancellation list and was told that we don't do that and if she wanted a sooner appt she should have scheduled before leaving. Pt was very taken aback by this as she states our office has never been anything but kind and professional and she felt this didn't fit with our norm

## 2023-05-31 ENCOUNTER — Encounter: Payer: Self-pay | Admitting: Family

## 2023-05-31 DIAGNOSIS — I130 Hypertensive heart and chronic kidney disease with heart failure and stage 1 through stage 4 chronic kidney disease, or unspecified chronic kidney disease: Secondary | ICD-10-CM | POA: Diagnosis not present

## 2023-05-31 DIAGNOSIS — I5032 Chronic diastolic (congestive) heart failure: Secondary | ICD-10-CM | POA: Diagnosis not present

## 2023-05-31 DIAGNOSIS — C8330 Diffuse large B-cell lymphoma, unspecified site: Secondary | ICD-10-CM | POA: Diagnosis not present

## 2023-05-31 DIAGNOSIS — N1832 Chronic kidney disease, stage 3b: Secondary | ICD-10-CM | POA: Diagnosis not present

## 2023-05-31 DIAGNOSIS — I5023 Acute on chronic systolic (congestive) heart failure: Secondary | ICD-10-CM | POA: Diagnosis not present

## 2023-05-31 DIAGNOSIS — M4854XD Collapsed vertebra, not elsewhere classified, thoracic region, subsequent encounter for fracture with routine healing: Secondary | ICD-10-CM | POA: Diagnosis not present

## 2023-06-02 ENCOUNTER — Ambulatory Visit: Payer: Medicare Other | Admitting: Family Medicine

## 2023-06-02 DIAGNOSIS — L03011 Cellulitis of right finger: Secondary | ICD-10-CM | POA: Diagnosis not present

## 2023-06-02 DIAGNOSIS — I509 Heart failure, unspecified: Secondary | ICD-10-CM

## 2023-06-02 DIAGNOSIS — R21 Rash and other nonspecific skin eruption: Secondary | ICD-10-CM

## 2023-06-02 DIAGNOSIS — N1832 Chronic kidney disease, stage 3b: Secondary | ICD-10-CM | POA: Diagnosis not present

## 2023-06-02 DIAGNOSIS — I5023 Acute on chronic systolic (congestive) heart failure: Secondary | ICD-10-CM | POA: Diagnosis not present

## 2023-06-02 DIAGNOSIS — I130 Hypertensive heart and chronic kidney disease with heart failure and stage 1 through stage 4 chronic kidney disease, or unspecified chronic kidney disease: Secondary | ICD-10-CM | POA: Diagnosis not present

## 2023-06-02 DIAGNOSIS — R918 Other nonspecific abnormal finding of lung field: Secondary | ICD-10-CM | POA: Diagnosis not present

## 2023-06-02 DIAGNOSIS — C8330 Diffuse large B-cell lymphoma, unspecified site: Secondary | ICD-10-CM | POA: Diagnosis not present

## 2023-06-02 DIAGNOSIS — E041 Nontoxic single thyroid nodule: Secondary | ICD-10-CM

## 2023-06-02 DIAGNOSIS — N183 Chronic kidney disease, stage 3 unspecified: Secondary | ICD-10-CM

## 2023-06-02 DIAGNOSIS — I5032 Chronic diastolic (congestive) heart failure: Secondary | ICD-10-CM | POA: Diagnosis not present

## 2023-06-02 DIAGNOSIS — M4854XD Collapsed vertebra, not elsewhere classified, thoracic region, subsequent encounter for fracture with routine healing: Secondary | ICD-10-CM | POA: Diagnosis not present

## 2023-06-02 LAB — BASIC METABOLIC PANEL
BUN: 24 mg/dL — ABNORMAL HIGH (ref 6–23)
CO2: 24 meq/L (ref 19–32)
Calcium: 8.4 mg/dL (ref 8.4–10.5)
Chloride: 107 meq/L (ref 96–112)
Creatinine, Ser: 1.97 mg/dL — ABNORMAL HIGH (ref 0.40–1.50)
GFR: 33.3 mL/min — ABNORMAL LOW (ref >60.00)
Glucose, Bld: 94 mg/dL (ref 70–99)
Potassium: 5.1 meq/L (ref 3.5–5.1)
Sodium: 142 meq/L (ref 135–145)

## 2023-06-02 LAB — MAGNESIUM: Magnesium: 1.6 mg/dL (ref 1.5–2.5)

## 2023-06-02 MED ORDER — PREDNISONE 20 MG PO TABS
40.0000 mg | ORAL_TABLET | Freq: Every day | ORAL | 0 refills | Status: DC
Start: 2023-06-02 — End: 2023-06-25

## 2023-06-02 MED ORDER — HYDROXYZINE PAMOATE 25 MG PO CAPS
25.0000 mg | ORAL_CAPSULE | Freq: Three times a day (TID) | ORAL | 0 refills | Status: DC | PRN
Start: 2023-06-02 — End: 2023-06-25

## 2023-06-02 MED ORDER — DOXYCYCLINE HYCLATE 100 MG PO TABS
100.0000 mg | ORAL_TABLET | Freq: Two times a day (BID) | ORAL | 0 refills | Status: DC
Start: 2023-06-02 — End: 2023-06-25

## 2023-06-02 MED ORDER — PERMETHRIN 5 % EX CREA: 1.0000 | TOPICAL_CREAM | Freq: Once | CUTANEOUS | 0 refills | Status: AC

## 2023-06-02 NOTE — Patient Instructions (Addendum)
I will check labs.  Decrease hydroxyzine to 25mg  for now to see if less hallucinations.  We will try permethrin to see if this could be scabies.  Apply at night, wash off in the morning. Avoid eyes, nose and mouth.  Start antibiotic for finger swelling, warm water soaks and gentle pressure to express pus. If worsening this weekend - see urgent care or ER.   If hallucinations resolve on lower dose hydroxyzine, and itching continues after use of permethrin, can take prednisone to help with itching.  This prescription was printed if needed. Chest xray at [facility]:  [facility] or xray: Walk in 8:30-4:30 during weekdays, no appointment needed [address]  [place], [place] [address] Keep follow-up with hematology.  Can discuss bone scan and other testing at that visit.  I have ordered ultrasound of thyroid for area seen when you are in the hospital. If any return of shortness of breath, or cough let me know right away as I would like to try an inhaler. Return to the clinic or go to the nearest emergency room if any of your symptoms worsen or new symptoms occur.   Paronychia Paronychia is an infection of the skin that surrounds a nail. It usually affects the skin around a fingernail, but it Picado also occur near a toenail. It often causes pain and swelling around the nail. In some cases, a collection of pus (abscess) can form near or under the nail.  This condition Mcclaren develop suddenly, or it Almas develop gradually over a longer period. In most cases, paronychia is not serious, and it will clear up with treatment. What are the causes? This condition Dumais be caused by bacteria or a fungus, such as yeast. The bacteria or fungus can enter the body through an opening in the skin, such as a cut or a hangnail, and cause an infection in your fingernail or toenail. Other causes Roes include: Recurrent injury to the fingernail or toenail area. Irritation of the base and sides of the nail (cuticle). Injury  and irritation can result in inflammation, swelling, and thickened skin around the nail. What increases the risk? This condition is more likely to develop in people who: Get their hands wet often, such as those who work as Fish farm manager, bartenders, or housekeepers. Bite their fingernails or cuticles. Have underlying skin conditions. Have hangnails or injured fingertips. Are exposed to irritants like detergents and other chemicals. Have diabetes. What are the signs or symptoms? Symptoms of this condition include: Redness and swelling of the skin near the nail. Tenderness around the nail when you touch the area. Pus-filled bumps under the cuticle. Fluid or pus under the nail. Throbbing pain in the area. How is this diagnosed? This condition is diagnosed with a physical exam. In some cases, a sample of pus Malkin be tested to determine what type of bacteria or fungus is causing the condition. How is this treated? Treatment depends on the cause and severity of your condition. If your condition is mild, it Pieper clear up on its own in a few days or after soaking in warm water. If needed, treatment Nault include: Antibiotic medicine, if your infection is caused by bacteria. Antifungal medicine, if your infection is caused by a fungus. A procedure to drain pus from an abscess. Anti-inflammatory medicine (corticosteroids). Removal of part of an ingrown toenail. A bandage (dressing) Mcloud be placed over the affected area if an abscess or part of a nail has been removed. Follow these instructions at  home: Wound care Keep the affected area clean. Soak the affected area in warm water if told to do so by your health care provider. You Quintin be told to do this for 20 minutes, 2-3 times a day. Keep the area dry when you are not soaking it. Do not try to drain an abscess yourself. Follow instructions from your health care provider about how to take care of the affected area. Make sure you: Wash your hands with  soap and water for at least 20 seconds before and after you change your dressing. If soap and water are not available, use hand sanitizer. Change your dressing as told by your health care provider. If you had an abscess drained, check the area every day for signs of infection. Check for: Redness, swelling, or pain. Fluid or blood. Warmth. Pus or a bad smell. Medicines  Take over-the-counter and prescription medicines only as told by your health care provider. If you were prescribed an antibiotic medicine, take it as told by your health care provider. Do not stop taking the antibiotic even if you start to feel better. General instructions Avoid contact with any skin irritants or allergens. Do not pick at the affected area. Keep all follow-up visits as told. This is important. Prevention To prevent this condition from happening again: Wear rubber gloves when washing dishes or doing other tasks that require your hands to get wet. Wear gloves if your hands might come in contact with cleaners or other chemicals. Avoid injuring your nails or fingertips. Do not bite your nails or tear hangnails. Do not cut your nails very short. Do not cut your cuticles. Use clean nail clippers or scissors when trimming nails. Contact a health care provider if: Your symptoms get worse or do not improve with treatment. You have continued or increased fluid, blood, or pus coming from the affected area. Your affected finger, toe, or joint becomes swollen or difficult to move. You have a fever or chills. There is redness spreading away from the affected area. Summary Paronychia is an infection of the skin that surrounds a nail. It often causes pain and swelling around the nail. In some cases, a collection of pus (abscess) can form near or under the nail. This condition Spates be caused by bacteria or a fungus. These germs can enter the body through an opening in the skin, such as a cut or a hangnail. If your  condition is mild, it Stapel clear up on its own in a few days. If needed, treatment Ozturk include medicine or a procedure to drain pus from an abscess. To prevent this condition from happening again, wear gloves if doing tasks that require your hands to get wet or to come in contact with chemicals. Also avoid injuring your nails or fingertips. This information is not intended to replace advice given to you by your health care provider. Make sure you discuss any questions you have with your health care provider. Document Revised: 10/20/2020 Document Reviewed: 10/20/2020 Elsevier Patient Education  2024 [employer].

## 2023-06-05 ENCOUNTER — Encounter: Payer: Self-pay | Admitting: Family Medicine

## 2023-06-05 LAB — WOUND CULTURE
MICRO NUMBER:: 15670069
SPECIMEN QUALITY:: ADEQUATE

## 2023-06-06 ENCOUNTER — Encounter: Payer: Self-pay | Admitting: Hematology & Oncology

## 2023-06-06 ENCOUNTER — Inpatient Hospital Stay: Payer: Medicare Other | Admitting: Hematology & Oncology

## 2023-06-06 ENCOUNTER — Telehealth: Payer: Self-pay | Admitting: Family Medicine

## 2023-06-06 ENCOUNTER — Inpatient Hospital Stay: Payer: Medicare Other

## 2023-06-06 ENCOUNTER — Inpatient Hospital Stay: Payer: Medicare Other | Attending: Hematology & Oncology

## 2023-06-06 ENCOUNTER — Telehealth: Payer: Self-pay

## 2023-06-06 ENCOUNTER — Other Ambulatory Visit: Payer: Self-pay

## 2023-06-06 VITALS — BP 144/66 | HR 77 | Temp 97.5°F | Resp 18 | Ht 67.0 in | Wt 162.0 lb

## 2023-06-06 DIAGNOSIS — Z85528 Personal history of other malignant neoplasm of kidney: Secondary | ICD-10-CM

## 2023-06-06 DIAGNOSIS — M4854XD Collapsed vertebra, not elsewhere classified, thoracic region, subsequent encounter for fracture with routine healing: Secondary | ICD-10-CM | POA: Diagnosis not present

## 2023-06-06 DIAGNOSIS — Z87891 Personal history of nicotine dependence: Secondary | ICD-10-CM | POA: Insufficient documentation

## 2023-06-06 DIAGNOSIS — I5032 Chronic diastolic (congestive) heart failure: Secondary | ICD-10-CM | POA: Diagnosis not present

## 2023-06-06 DIAGNOSIS — Z9221 Personal history of antineoplastic chemotherapy: Secondary | ICD-10-CM | POA: Insufficient documentation

## 2023-06-06 DIAGNOSIS — Z905 Acquired absence of kidney: Secondary | ICD-10-CM

## 2023-06-06 DIAGNOSIS — C8330 Diffuse large B-cell lymphoma, unspecified site: Secondary | ICD-10-CM | POA: Diagnosis not present

## 2023-06-06 DIAGNOSIS — N1832 Chronic kidney disease, stage 3b: Secondary | ICD-10-CM | POA: Diagnosis not present

## 2023-06-06 DIAGNOSIS — Z800 Family history of malignant neoplasm of digestive organs: Secondary | ICD-10-CM | POA: Diagnosis not present

## 2023-06-06 DIAGNOSIS — T7840XA Allergy, unspecified, initial encounter: Secondary | ICD-10-CM | POA: Diagnosis not present

## 2023-06-06 DIAGNOSIS — D649 Anemia, unspecified: Secondary | ICD-10-CM | POA: Diagnosis not present

## 2023-06-06 DIAGNOSIS — R21 Rash and other nonspecific skin eruption: Secondary | ICD-10-CM | POA: Insufficient documentation

## 2023-06-06 DIAGNOSIS — I130 Hypertensive heart and chronic kidney disease with heart failure and stage 1 through stage 4 chronic kidney disease, or unspecified chronic kidney disease: Secondary | ICD-10-CM | POA: Diagnosis not present

## 2023-06-06 DIAGNOSIS — Z8572 Personal history of non-Hodgkin lymphomas: Secondary | ICD-10-CM | POA: Insufficient documentation

## 2023-06-06 DIAGNOSIS — L308 Other specified dermatitis: Secondary | ICD-10-CM | POA: Diagnosis not present

## 2023-06-06 DIAGNOSIS — I5023 Acute on chronic systolic (congestive) heart failure: Secondary | ICD-10-CM | POA: Diagnosis not present

## 2023-06-06 LAB — IRON AND IRON BINDING CAPACITY (CC-WL,HP ONLY)
Iron: 59 ug/dL (ref 45–182)
Saturation Ratios: 42 % — ABNORMAL HIGH (ref 17.9–39.5)
TIBC: 140 ug/dL — ABNORMAL LOW (ref 250–450)
UIBC: 81 ug/dL — ABNORMAL LOW (ref 117–376)

## 2023-06-06 LAB — CMP (CANCER CENTER ONLY)
ALT: 13 U/L (ref 0–44)
AST: 18 U/L (ref 15–41)
Albumin: 2.9 g/dL — ABNORMAL LOW (ref 3.5–5.0)
Alkaline Phosphatase: 207 U/L — ABNORMAL HIGH (ref 38–126)
Anion gap: 9 (ref 5–15)
BUN: 30 mg/dL — ABNORMAL HIGH (ref 8–23)
CO2: 23 mmol/L (ref 22–32)
Calcium: 8.2 mg/dL — ABNORMAL LOW (ref 8.9–10.3)
Chloride: 109 mmol/L (ref 98–111)
Creatinine: 1.92 mg/dL — ABNORMAL HIGH (ref 0.61–1.24)
GFR, Estimated: 37 mL/min — ABNORMAL LOW (ref >60)
Glucose, Bld: 105 mg/dL — ABNORMAL HIGH (ref 70–99)
Potassium: 4.3 mmol/L (ref 3.5–5.1)
Sodium: 141 mmol/L (ref 135–145)
Total Bilirubin: 0.4 mg/dL (ref <1.2)
Total Protein: 5.4 g/dL — ABNORMAL LOW (ref 6.5–8.1)

## 2023-06-06 LAB — CBC WITH DIFFERENTIAL (CANCER CENTER ONLY)
Abs Immature Granulocytes: 0.23 10*3/uL — ABNORMAL HIGH (ref 0.00–0.07)
Basophils Absolute: 0.1 10*3/uL (ref 0.0–0.1)
Basophils Relative: 1 %
Eosinophils Absolute: 2.7 10*3/uL — ABNORMAL HIGH (ref 0.0–0.5)
Eosinophils Relative: 20 %
HCT: 30.3 % — ABNORMAL LOW (ref 39.0–52.0)
Hemoglobin: 9.8 g/dL — ABNORMAL LOW (ref 13.0–17.0)
Immature Granulocytes: 2 %
Lymphocytes Relative: 11 %
Lymphs Abs: 1.4 10*3/uL (ref 0.7–4.0)
MCH: 28.9 pg (ref 26.0–34.0)
MCHC: 32.3 g/dL (ref 30.0–36.0)
MCV: 89.4 fL (ref 80.0–100.0)
Monocytes Absolute: 1 10*3/uL (ref 0.1–1.0)
Monocytes Relative: 8 %
Neutro Abs: 7.9 10*3/uL — ABNORMAL HIGH (ref 1.7–7.7)
Neutrophils Relative %: 58 %
Platelet Count: 262 10*3/uL (ref 150–400)
RBC: 3.39 MIL/uL — ABNORMAL LOW (ref 4.22–5.81)
RDW: 15.5 % (ref 11.5–15.5)
WBC Count: 13.4 10*3/uL — ABNORMAL HIGH (ref 4.0–10.5)
nRBC: 0 % (ref 0.0–0.2)

## 2023-06-06 LAB — RETICULOCYTES
Immature Retic Fract: 14.9 % (ref 2.3–15.9)
RBC.: 3.39 MIL/uL — ABNORMAL LOW (ref 4.22–5.81)
Retic Count, Absolute: 79.3 10*3/uL (ref 19.0–186.0)
Retic Ct Pct: 2.3 % (ref 0.4–3.1)

## 2023-06-06 LAB — FERRITIN: Ferritin: 173 ng/mL (ref 24–336)

## 2023-06-06 NOTE — Telephone Encounter (Signed)
-----   Message from Shade Flood sent at 06/05/2023  5:37 PM EST ----- Please call with results, see prior note, accidentally routed to just [facility].  Thanks.

## 2023-06-06 NOTE — Patient Instructions (Signed)

## 2023-06-06 NOTE — Telephone Encounter (Signed)
Called patient's daughter to ensure that the needle was taken out of his port before he left. Daughter will check and they will come back if the needle is still in.

## 2023-06-06 NOTE — Telephone Encounter (Signed)
Received forms from [employer] & placed in provider bin

## 2023-06-06 NOTE — Progress Notes (Addendum)
Referral MD  Reason for Referral: Anemia-likely multifactorial; history of diffuse large B cell non-Hodgkin's lymphoma  Chief Complaint  Patient presents with   New Patient (Initial Visit)  : I am itching like crazy.  HPI: Gilbert [name] is well-known to me.  He is a very nice 72 year old white male.  I have known him for quite a while.  He initially was seen by [place] back in 2017 when he presented with diffuse large B-cell lymphoma.  We had to treat him with systemic chemotherapy without Adriamycin because of cardiac issues.  He completed his chemotherapy in November 2018.  He has been getting Xgeva 3 times a year.  I think the last time he got Rivka Barbara was probably back in 2020.  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  He does have a nephrectomy.  He apparently was hospitalized back in October.  He was anemic.  He had renal failure.  However, the real problem that he has is this rash.  There is a's a diffuse maculopapular type rash.  He is itching quite a bit.  He has a lot of excoriations.  He is on no new medications.  He has had no travel.  There is been no obvious infections.  He has had no fever.  He has had no obvious change in bowel or bladder habits.  He has had some weight loss.  According to his daughter, he has had this for about 6 months.  He was told that he could not have a biopsy while he was in the hospital that he needed to go to a dermatologist.  Again, he, I think, was sent here because of his anemia.  We did check an erythropoietin level on him back in 2019.  It was only 10.9.  Of course, when he was in the hospital, he was told that he Saindon have cancer.  I am not exactly sure as to what the reference to this well is.  I suspect that this Lawless have been a CT scan of the head on the chest back in October.  There shows some chronic sclerotic change in the left glenoid process as well as T11 vertebral body.  However, there is no adenopathy.   There is no splenomegaly.  There is no hepatic involvement.  He had no adenopathy.  In fact, there was no enlarged hilar nodes.  He did have a CT of the kidneys.  This is for renal stone.  Again, there is some stable sclerotic lesions at L4.  He had mild wedging at T11.  Again, his real problem is this rash.  He is not smoking.  He does have cardiac issues.  Of note, back on 05/12/2023, his white cell count was 5.1.  Hemoglobin 7.9 platelet count 110,000.  6 days later, his white cell count was 10.6.  Hemoglobin 8.9.  Platelet count 284,000.  He was never transfused.  His appetite is down.  He has had no problems with bowels or bladder.  There is been no bleeding.  He has had no melena or hematochezia.  Overall, I would say that his performance status is probably ECOG 2.   Past Medical History:  Diagnosis Date   Abnormal liver enzymes    Chronic alkaline phosphatase elevation since 2014   Acid reflux    CAD (coronary artery disease)    Cancer (HCC) 08/2013   right kidney cancer   Cancer [facility])    Coronary artery  calcification seen on CAT scan    Coronary atherosclerosis of native coronary artery    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 Used Impella Circulatory assist device. EF= 20-25%   History of epilepsy    at least 10 years ago. Grandmal seizures since childhood   History of myocardial infarction less than 8 weeks    Stent placed   History of nephrectomy 11/2013   For renal cell carcinoma   History of tobacco use    HTN (hypertension)    Hypercholesteremia    Hyperlipemia    Hypertension    PAD (peripheral artery  disease) (HCC)    Seizures (HCC)    LAST SZ 15-20 YEARS AGO 03-30-2021   Shortness of breath    with activity   SOB (shortness of breath)    Systolic and diastolic CHF, chronic (HCC)    Resolved. EF 45% 04/10/2014   Therapeutic drug monitoring   :   Past Surgical History:  Procedure Laterality Date   BALLOON DILATION N/A 01/21/2015   Procedure: BALLOON DILATION;  Surgeon: Iva Boop, MD;  Location: [facility] ENDOSCOPY;  Service: Endoscopy;  Laterality: N/A;   CARDIAC CATHETERIZATION N/A 06/10/2015   Procedure: Left Heart Cath and Coronary Angiography;  Surgeon: Yates Decamp, MD;  [facility] CV LAB;  Service: Cardiovascular;  Laterality: N/A;   CORONARY ANGIOPLASTY WITH STENT PLACEMENT  08/02/2013   Left main coronary artery stenting on emergent basis along with circulatory support with Impella device through left leg. With 4.0 x 18 mm Xience alpine stent   EAR CYST EXCISION Left 05/31/2018   Procedure: EXCISION LEFT POSTERIOR EAR TUMOR;  Surgeon: Newman Pies, MD;  Location: [facility] OR;  Service: ENT;  Laterality: Left;   ESOPHAGOGASTRODUODENOSCOPY     ESOPHAGOGASTRODUODENOSCOPY N/A 01/21/2015   Procedure: ESOPHAGOGASTRODUODENOSCOPY (EGD) with possible dilation.;  Surgeon: Iva Boop, MD;  Location: [facility] ENDOSCOPY;  Service: Endoscopy;  Laterality: N/A;   EYE SURGERY Left 2 weeks ago   torn retina   ILIAC ARTERY STENT Left 05/28/2014   dr Jacinto Halim   LEFT HEART CATHETERIZATION WITH CORONARY ANGIOGRAM N/A 11/27/2013   Procedure: LEFT HEART CATHETERIZATION WITH CORONARY ANGIOGRAM;  Surgeon: Pamella Pert, MD;  Location: [facility] CATH LAB;  Service: Cardiovascular;  Laterality: N/A;   LOWER EXTREMITY ANGIOGRAM N/A 02/26/2014   Procedure: LOWER EXTREMITY ANGIOGRAM;  Surgeon: Pamella Pert, MD;  Location: [facility] CATH LAB;  Service: Cardiovascular;  Laterality: N/A;   LOWER EXTREMITY ANGIOGRAM N/A 05/28/2014   Procedure: LOWER EXTREMITY ANGIOGRAM;  Surgeon: Pamella Pert, MD;  Location: [facility] CATH  LAB;  Service: Cardiovascular;  Laterality: N/A;   NEPHRECTOMY  11/30/2013   PERCUTANEOUS CORONARY STENT INTERVENTION (PCI-S)  11/27/2013   Procedure: PERCUTANEOUS CORONARY STENT INTERVENTION (PCI-S);  Surgeon: Pamella Pert, MD;  Location: [facility] CATH LAB;  Service: Cardiovascular;;   RIGHT/LEFT HEART CATH AND CORONARY ANGIOGRAPHY N/A 03/23/2022   Procedure: RIGHT/LEFT HEART CATH AND CORONARY ANGIOGRAPHY;  Surgeon: Yates Decamp, MD;  Location: [facility] INVASIVE CV LAB;  Service: Cardiovascular;  Laterality: N/A;   ROBOT ASSISTED LAPAROSCOPIC NEPHRECTOMY Right 10/19/2013   Procedure: ROBOTIC ASSISTED LAPAROSCOPIC NEPHRECTOMY,  EXTENSIVE ADHESIOLYSIS;  Surgeon: Sebastian Ache, MD;  Location: [facility] ORS;  Service: Urology;  Laterality: Right;   SKIN FULL THICKNESS GRAFT Left 05/31/2018   Procedure: SKIN GRAFT FULL THICKNESS FROM LEFT ABDOMEN TO LEFT EAR;  Surgeon: Newman Pies, MD;  Location: [facility] OR;  Service: ENT;  Laterality: Left;  :   Current Outpatient Medications:    permethrin (ELIMITE) 5 % cream, Apply topically once., Disp: , Rfl:    acetaminophen (TYLENOL) 500 MG tablet, Take 1,000 mg by mouth daily., Disp: , Rfl:    APPLE CIDER VINEGAR PO, Take 480 mg by mouth daily., Disp: , Rfl:    carvedilol (COREG) 3.125 MG tablet, Take 1 tablet (3.125 mg total) by mouth 2 (two) times daily with a meal., Disp: 60 tablet, Rfl: 0   clopidogrel (PLAVIX) 75 MG tablet, Take 1 tablet (75 mg total) by mouth daily., Disp: 90 tablet, Rfl: 3   Cyanocobalamin (B-12) 2500 MCG TABS, Take 2,500 mcg by mouth every morning., Disp: , Rfl:    doxycycline (VIBRA-TABS) 100 MG tablet, Take 1 tablet (100 mg total) by mouth 2 (two) times daily., Disp: 20 tablet, Rfl: 0   ezetimibe (ZETIA) 10 MG tablet, Take 1 tablet (10 mg total) by mouth daily., Disp: 90 tablet, Rfl: 3   famotidine (PEPCID) 20 MG tablet, Take 20 mg by mouth 2 (two) times daily., Disp: , Rfl:    hydrOXYzine (VISTARIL) 25 MG capsule, Take 1 capsule (25 mg total) by mouth  every 8 (eight) hours as needed., Disp: 30 capsule, Rfl: 0   isosorbide mononitrate (IMDUR) 60 MG 24 hr tablet, Take 1 tablet (60 mg total) by mouth daily., Disp: 90 tablet, Rfl: 3   phenytoin (DILANTIN) 100 MG ER capsule, Take 3 capsules (300 mg total) by mouth daily., Disp: 90 capsule, Rfl: 0   predniSONE (DELTASONE) 20 MG tablet, Take 2 tablets (40 mg total) by mouth daily with breakfast., Disp: 10 tablet, Rfl: 0   pyridOXINE (VITAMIN B-6) 100 MG tablet, Take 100 mg by mouth every morning. , Disp: , Rfl:    rosuvastatin (CRESTOR) 40 MG tablet, Take 1 tablet (40 mg total) by mouth daily. (Patient taking differently: Take 40 mg by mouth daily. Cardiologist provider want it change to 10 mg), Disp: 90 tablet, Rfl: 3   Thiamine HCl (VITAMIN B-1) 250 MG tablet, Take 250 mg by mouth every morning. , Disp: , Rfl: :  :   Allergies  Allergen Reactions   Oxycodone Swelling and Other (See Comments)    Tongue and lips swell  :   Family History  Problem Relation Age of Onset   Stroke Mother    Emphysema Father    Heart disease Brother        multiple stents   Hyperlipidemia Brother    Stomach cancer Maternal Grandmother    Colon cancer Neg Hx    Colon polyps Neg Hx    Esophageal cancer Neg Hx    Rectal cancer Neg Hx   :   Social History   Socioeconomic History   Marital status: Divorced    Spouse name: Not on file   Number of children: 2   Years of education: Not on file   Highest education level: Not on file  Occupational History   Occupation: retired  Tobacco Use   Smoking status: Former    Current packs/day: 0.00    Average packs/day: 2.0 packs/day for 45.0 years (90.0 ttl pk-yrs)    Types: Cigarettes  Start date: 08/02/1958    Quit date: 08/03/2003    Years since quitting: 19.8   Smokeless tobacco: Current    Types: Chew   Tobacco comments:    quit  smoking 10 years agop  Vaping Use   Vaping status: Never Used  Substance and Sexual Activity   Alcohol use: No     Alcohol/week: 0.0 standard drinks of alcohol    Comment: quit 1980   Drug use: No    Comment: none in past 30 years    Sexual activity: Not Currently  Other Topics Concern   Not on file  Social History Narrative   ** Merged History Encounter **       Social Determinants of Health   Financial Resource Strain: Low Risk  (03/11/2022)   Overall Financial Resource Strain (CARDIA)    Difficulty of Paying Living Expenses: Not hard at all  Food Insecurity: Food Insecurity Present (05/13/2023)   Hunger Vital Sign    Worried About Running Out of Food in the Last Year: Sometimes true    Ran Out of Food in the Last Year: Sometimes true  Transportation Needs: No Transportation Needs (05/13/2023)   PRAPARE - Administrator, Civil Service (Medical): No    Lack of Transportation (Non-Medical): No  Physical Activity: Insufficiently Active (03/11/2022)   Exercise Vital Sign    Days of Exercise per Week: 3 days    Minutes of Exercise per Session: 30 min  Stress: No Stress Concern Present (03/11/2022)   [employer] of Occupational Health - Occupational Stress Questionnaire    Feeling of Stress : Not at all  Social Connections: Socially Isolated (03/11/2022)   Social Connection and Isolation Panel [NHANES]    Frequency of Communication with Friends and Family: Three times a week    Frequency of Social Gatherings with Friends and Family: Three times a week    Attends Religious Services: Never    Active Member of Clubs or Organizations: No    Attends Banker Meetings: Never    Marital Status: Divorced  Catering manager Violence: Not At Risk (05/06/2023)   Humiliation, Afraid, Rape, and Kick questionnaire    Fear of Current or Ex-Partner: No    Emotionally Abused: No    Physically Abused: No    Sexually Abused: No  :  Review of Systems  Constitutional:  Positive for malaise/fatigue and weight loss.  HENT: Negative.    Eyes: Negative.   Respiratory: Negative.     Cardiovascular:  Positive for palpitations.  Gastrointestinal:  Positive for nausea.  Genitourinary: Negative.   Musculoskeletal:  Positive for myalgias.  Skin:  Positive for itching and rash.  Neurological:  Positive for weakness.  Endo/Heme/Allergies: Negative.   Psychiatric/Behavioral: Negative.       Exam: Vital signs show temperature of 97.5.  Pulse 77.  Blood pressure 144/66.  Weight is 162 pounds.  @IPVITALS @ Physical Exam Vitals reviewed.  HENT:     Head: Normocephalic and atraumatic.  Eyes:     Pupils: Pupils are equal, round, and reactive to light.  Cardiovascular:     Rate and Rhythm: Normal rate and regular rhythm.     Heart sounds: Normal heart sounds.  Pulmonary:     Effort: Pulmonary effort is normal.     Breath sounds: Normal breath sounds.  Abdominal:     General: Bowel sounds are normal.     Palpations: Abdomen is soft.  Musculoskeletal:        General: No  tenderness or deformity. Normal range of motion.     Cervical back: Normal range of motion.  Lymphadenopathy:     Cervical: No cervical adenopathy.  Skin:    General: Skin is warm and dry.     Findings: No erythema or rash.     Comments: Skin exam shows a diffuse maculopapular rash.  There is a lot of excoriation.  There are no pustules.  There are no vesicles.  Neurological:     Mental Status: He is alert and oriented to person, place, and time.  Psychiatric:        Behavior: Behavior normal.        Thought Content: Thought content normal.        Judgment: Judgment normal.            Recent Labs    06/06/23 1100  WBC 13.4*  HGB 9.8*  HCT 30.3*  PLT 262    Recent Labs    06/06/23 1100  NA 141  K 4.3  CL 109  CO2 23  GLUCOSE 105*  BUN 30*  CREATININE 1.92*  CALCIUM 8.2*    Blood smear review: None  Pathology: None    Assessment and Plan: Gilbert [name] is a very nice 72 year old white male.  Again, he has this horrible rash.  Again I am not sure why has this rash.  I do  not know if this is some kind of reaction to medication.  I do not know if there is any count of vasculitis.  I cannot imagine that this is anything malignant.  However, this is always a possibility.  I did call Dr. Nita Sells of Dermatology.  He kindly agreed to see Gilbert [name] today.  Hopefully, he can have an idea as to what is going on.  His hemoglobin certainly is better.  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  We will see what his iron levels look like.  I would like to get him back to see [place] probably in about 3 weeks or so depending on what is found by Dr. Margo Aye.  I am so glad that his daughter came with him.  She is always an incredible source of advocacy for him.

## 2023-06-06 NOTE — Telephone Encounter (Signed)
I cannot find lab result please let me know what I need to tell this patient and I will call him

## 2023-06-06 NOTE — Telephone Encounter (Signed)
I am unable to see my previous comments either.  wound culture was positive for Staph aureus bacteria but sensitive to the antibiotic prescribed.  Magnesium level was normal.  Kidney function test was slightly higher than previous reading.  Make sure to drink plenty of fluids and we need to recheck this within the next week or so.  Keep follow-up this week as planned for recheck finger wound.  Let me know if there are questions.

## 2023-06-07 ENCOUNTER — Inpatient Hospital Stay: Payer: Medicare Other

## 2023-06-07 ENCOUNTER — Telehealth: Payer: Self-pay | Admitting: Family Medicine

## 2023-06-07 DIAGNOSIS — Z9221 Personal history of antineoplastic chemotherapy: Secondary | ICD-10-CM | POA: Diagnosis not present

## 2023-06-07 DIAGNOSIS — Z800 Family history of malignant neoplasm of digestive organs: Secondary | ICD-10-CM | POA: Diagnosis not present

## 2023-06-07 DIAGNOSIS — Z85528 Personal history of other malignant neoplasm of kidney: Secondary | ICD-10-CM | POA: Diagnosis not present

## 2023-06-07 DIAGNOSIS — D649 Anemia, unspecified: Secondary | ICD-10-CM | POA: Diagnosis not present

## 2023-06-07 DIAGNOSIS — Z8572 Personal history of non-Hodgkin lymphomas: Secondary | ICD-10-CM | POA: Diagnosis not present

## 2023-06-07 DIAGNOSIS — C8589 Other specified types of non-Hodgkin lymphoma, extranodal and solid organ sites: Secondary | ICD-10-CM

## 2023-06-07 DIAGNOSIS — R21 Rash and other nonspecific skin eruption: Secondary | ICD-10-CM | POA: Diagnosis not present

## 2023-06-07 DIAGNOSIS — C8330 Diffuse large B-cell lymphoma, unspecified site: Secondary | ICD-10-CM

## 2023-06-07 DIAGNOSIS — Z905 Acquired absence of kidney: Secondary | ICD-10-CM | POA: Diagnosis not present

## 2023-06-07 DIAGNOSIS — Z87891 Personal history of nicotine dependence: Secondary | ICD-10-CM | POA: Diagnosis not present

## 2023-06-07 LAB — ERYTHROPOIETIN: Erythropoietin: 12.4 m[IU]/mL (ref 2.6–18.5)

## 2023-06-07 MED ORDER — DIPHENHYDRAMINE HCL 50 MG/ML IJ SOLN
25.0000 mg | Freq: Once | INTRAMUSCULAR | Status: DC | PRN
Start: 2023-06-07 — End: 2023-06-07

## 2023-06-07 MED ORDER — DIPHENHYDRAMINE HCL 50 MG/ML IJ SOLN
50.0000 mg | Freq: Once | INTRAMUSCULAR | Status: DC | PRN
Start: 2023-06-07 — End: 2023-06-07

## 2023-06-07 MED ORDER — HEPARIN SOD (PORK) LOCK FLUSH 100 UNIT/ML IV SOLN
500.0000 [IU] | Freq: Once | INTRAVENOUS | Status: AC | PRN
Start: 2023-06-07 — End: 2023-06-07
  Administered 2023-06-07: 500 [IU]

## 2023-06-07 MED ORDER — SODIUM CHLORIDE 0.9% FLUSH
10.0000 mL | INTRAVENOUS | Status: DC | PRN
Start: 2023-06-07 — End: 2023-06-07
  Administered 2023-06-07: 10 mL

## 2023-06-07 MED ORDER — EPINEPHRINE HCL 0.1 MG/ML IJ SOLN
0.2500 mg | Freq: Once | INTRAMUSCULAR | Status: DC | PRN
Start: 2023-06-07 — End: 2023-06-07

## 2023-06-07 MED ORDER — ALBUTEROL SULFATE (2.5 MG/3ML) 0.083% IN NEBU: 2.5000 mg | INHALATION_SOLUTION | Freq: Once | RESPIRATORY_TRACT | Status: DC | PRN

## 2023-06-07 MED ORDER — EPINEPHRINE HCL 0.1 MG/ML IJ SOLN: 0.2500 mg | INTRAMUSCULAR | Status: DC | PRN

## 2023-06-07 MED ORDER — METHYLPREDNISOLONE SODIUM SUCC 125 MG IJ SOLR
125.0000 mg | Freq: Once | INTRAMUSCULAR | Status: DC | PRN
Start: 2023-06-07 — End: 2023-06-07

## 2023-06-07 MED ORDER — SODIUM CHLORIDE 0.9 % IV SOLN: Freq: Once | INTRAVENOUS | Status: DC | PRN

## 2023-06-07 NOTE — Telephone Encounter (Signed)
Needs Written order for shower chair with arms sent to DME we worked with for last order. Needs to be on Rx pad then can send necessary documents

## 2023-06-07 NOTE — Telephone Encounter (Signed)
Placed in sign folder. 

## 2023-06-07 NOTE — Telephone Encounter (Signed)
Paperwork completed and placed in fax bin at back nurse station

## 2023-06-07 NOTE — Telephone Encounter (Signed)
Called patient to discuss lab work, no answer, left a message for the patient to call back to discuss or review by MyChart if that is prefered.

## 2023-06-07 NOTE — Telephone Encounter (Signed)
I can complete, but will need paper rx pad. Can fill that out in next few days. Thanks.

## 2023-06-07 NOTE — Patient Instructions (Signed)

## 2023-06-07 NOTE — Telephone Encounter (Signed)
Home Health Verbal Orders- DME   Agency:  [facility]  Caller: Amy  Call back #: [phone] Fax #:    Requesting PT:    Needs shower with arms

## 2023-06-08 NOTE — Telephone Encounter (Signed)
Faxed back to requested number

## 2023-06-10 ENCOUNTER — Encounter: Payer: Self-pay | Admitting: Family Medicine

## 2023-06-10 ENCOUNTER — Telehealth: Payer: Self-pay | Admitting: Family Medicine

## 2023-06-10 ENCOUNTER — Ambulatory Visit: Payer: Medicare Other | Admitting: Family Medicine

## 2023-06-10 DIAGNOSIS — I5032 Chronic diastolic (congestive) heart failure: Secondary | ICD-10-CM | POA: Diagnosis not present

## 2023-06-10 DIAGNOSIS — I5023 Acute on chronic systolic (congestive) heart failure: Secondary | ICD-10-CM | POA: Diagnosis not present

## 2023-06-10 DIAGNOSIS — C8330 Diffuse large B-cell lymphoma, unspecified site: Secondary | ICD-10-CM | POA: Diagnosis not present

## 2023-06-10 DIAGNOSIS — N1832 Chronic kidney disease, stage 3b: Secondary | ICD-10-CM | POA: Diagnosis not present

## 2023-06-10 DIAGNOSIS — I130 Hypertensive heart and chronic kidney disease with heart failure and stage 1 through stage 4 chronic kidney disease, or unspecified chronic kidney disease: Secondary | ICD-10-CM | POA: Diagnosis not present

## 2023-06-10 DIAGNOSIS — M4854XD Collapsed vertebra, not elsewhere classified, thoracic region, subsequent encounter for fracture with routine healing: Secondary | ICD-10-CM | POA: Diagnosis not present

## 2023-06-10 MED ORDER — PHENYTOIN SODIUM EXTENDED 300 MG PO CAPS: 300.0000 mg | ORAL_CAPSULE | Freq: Every day | ORAL | 1 refills | Status: DC

## 2023-06-10 NOTE — Telephone Encounter (Signed)
Called patient to discuss lab work, no answer, left a message for the patient to call back to discuss or review by MyChart if that is prefered.

## 2023-06-10 NOTE — Telephone Encounter (Signed)
pHome Health Certification or Plan of Care Tracking  Is this a Plan of Care?  HH Agency: [facility]   Order Number:    Has charge sheet been attached? Yes   Where has form been placed:   [facility]

## 2023-06-10 NOTE — Telephone Encounter (Signed)
Placed in sign folder at back station

## 2023-06-10 NOTE — Telephone Encounter (Signed)
We had planned on a follow-up visit this week with Dr. Beverely Low.  Not sure if that was canceled or what happened.  I am concerned about the hallucinations, should definitely be seen if that did not improve with a lower dose hydroxyzine.  Although urinary tract infections can sometimes be a cause of delirium, would be less likely if he has no urinary symptoms.  Did they try the lower dose hydroxyzine?  Any change in symptoms on that lower dose?  If only intermittent symptoms and no significant change over the past week, can be seen by anyone early next week.  Urgent care or ER over the weekend if any new or worsening symptoms.

## 2023-06-10 NOTE — Telephone Encounter (Signed)
Daughter called back about skin concern

## 2023-06-10 NOTE — Telephone Encounter (Signed)
Noted.  Based on the notes we made that change in October of last year, as there were issues with  Dilantin 300 mg availability to my knowledge, but I can try writing that again.  Please check to see if it is available.  If not it Caselli be something where we try coming off that medication and try different antiepileptic or discussed that with neurology.  I am glad that he was able to see Dr. Margo Aye and appreciate his help.  Additionally I have completed the prescription for the shower chair with arms, placed in fax bin at back nurse station.

## 2023-06-10 NOTE — Telephone Encounter (Signed)
Pt wants to do drop off urine for possible UTI please advise

## 2023-06-10 NOTE — Telephone Encounter (Signed)
Placed in sign folder at nurse station

## 2023-06-10 NOTE — Telephone Encounter (Signed)
Received forms from [employer] & placed in provider bin

## 2023-06-10 NOTE — Telephone Encounter (Signed)
Pt's daughter had called back. I read the notes to her. She stated they would like to try again with the Dilantin 300mg . And she also faxed over some results given by dermatologist that shows he has an allergy to Dilantin. I have indexed results into media tab of chart.

## 2023-06-10 NOTE — Telephone Encounter (Signed)
Daughter called about pt, he started breaking out with a rash when he started taking the Dilantin 100mg  TID. He was stable with no rash when he was taking the Dilantin 300mg  once a day. Daughter states she spoke to pt's dermatologist, Dr. Nita Sells and they mentioned rash being common with the 100mg  because it's manufactured by a different company. Daughter wants to know what the options are? I tried to get him in for a visit but the earliest OV is 11/27 and they don't want to wait quite that long. Earliest same day is next week. [place]

## 2023-06-12 ENCOUNTER — Encounter: Payer: Self-pay | Admitting: Cardiology

## 2023-06-12 DIAGNOSIS — R82998 Other abnormal findings in urine: Secondary | ICD-10-CM | POA: Diagnosis not present

## 2023-06-12 DIAGNOSIS — R410 Disorientation, unspecified: Secondary | ICD-10-CM | POA: Diagnosis not present

## 2023-06-12 DIAGNOSIS — N390 Urinary tract infection, site not specified: Secondary | ICD-10-CM | POA: Diagnosis not present

## 2023-06-12 MED ORDER — CARVEDILOL 3.125 MG PO TABS: 3.1250 mg | ORAL_TABLET | Freq: Two times a day (BID) | ORAL | 3 refills | Status: DC

## 2023-06-13 ENCOUNTER — Telehealth: Payer: Self-pay | Admitting: Family Medicine

## 2023-06-13 NOTE — Telephone Encounter (Signed)
LM again.

## 2023-06-13 NOTE — Telephone Encounter (Signed)
Home Health   Agency:  [facility]  Caller: Amy  Call back #: [phone] Fax #:    Requesting PT    Reason for Request:    Daughter called stating change in pt condition unable to walk dizziness hallucination. Reporting change in condition

## 2023-06-14 NOTE — Telephone Encounter (Signed)
Daughter is resistant to taking him to the ER here is her update

## 2023-06-14 NOTE — Telephone Encounter (Signed)
Spoke with Gilbert [name] - she spoke with Gilbert [name] dtr Gilbert [name] yesterday - she noticed change in condition - legs weak,  dizzy, hallucinations. Gilbert [name] recommended ER visit.  They declined and Gilbert [name] noted she would advise me.    Called Gilbert [name]- saw Urgent care 2 days ago - no sign of infection, but sent out for culture  antibiotic, injection of abx and abx ordered,  No ability to walk today.  Progressive weakness in legs past few days. Has been having persistent hallucinations - off and on but seems to occur after he takes dilantin in am.  No change in symptoms after stopping the hydroxyzine and Benadryl.   Using cream from derm for rash that has been helpful.  I discussed my concerns with his acute change in status and possibility of delirium.  Also discussed concern that if no apparent sign of urinary tract infection on urgent care evaluation 2 days ago, and minimal change in symptoms with treatment from urgent care that there Neyer be another cause of delirium, with potential worsening of symptoms without appropriate treatment.  ER evaluation recommended again.  If they are having difficulty with getting him to ER, EMS transport is an option.  Concerns discussed regarding Dilantin, but he has been on that for many years without difficulty.  Potentially could have him discuss Dilantin in the ER and possible neurology eval to see if other medication recommended as no recent seizure.  Again these discussions need to happen through ER after he is evaluated for progressive weakness and mental status changes, hallucinations.  Understanding expressed.  Questions were answered.  19-minute call.

## 2023-06-14 NOTE — Telephone Encounter (Signed)
Sent  to Dr.Greene 

## 2023-06-14 NOTE — Telephone Encounter (Signed)
Letter out

## 2023-06-15 ENCOUNTER — Other Ambulatory Visit: Payer: Self-pay

## 2023-06-15 ENCOUNTER — Emergency Department: Payer: Medicare Other

## 2023-06-15 ENCOUNTER — Inpatient Hospital Stay
Admission: EM | Admit: 2023-06-15 | Discharge: 2023-07-03 | DRG: 907 | Disposition: E | Payer: Medicare Other | Attending: Pulmonary Disease | Admitting: Pulmonary Disease

## 2023-06-15 ENCOUNTER — Ambulatory Visit: Payer: Medicare Other | Admitting: Family Medicine

## 2023-06-15 ENCOUNTER — Inpatient Hospital Stay: Payer: Medicare Other

## 2023-06-15 DIAGNOSIS — G9340 Encephalopathy, unspecified: Secondary | ICD-10-CM

## 2023-06-15 DIAGNOSIS — Z885 Allergy status to narcotic agent status: Secondary | ICD-10-CM

## 2023-06-15 DIAGNOSIS — T68XXXA Hypothermia, initial encounter: Secondary | ICD-10-CM

## 2023-06-15 DIAGNOSIS — G928 Other toxic encephalopathy: Secondary | ICD-10-CM | POA: Diagnosis not present

## 2023-06-15 DIAGNOSIS — I252 Old myocardial infarction: Secondary | ICD-10-CM

## 2023-06-15 DIAGNOSIS — I700 Atherosclerosis of aorta: Secondary | ICD-10-CM | POA: Diagnosis not present

## 2023-06-15 DIAGNOSIS — Z955 Presence of coronary angioplasty implant and graft: Secondary | ICD-10-CM

## 2023-06-15 DIAGNOSIS — R6521 Severe sepsis with septic shock: Secondary | ICD-10-CM | POA: Diagnosis present

## 2023-06-15 DIAGNOSIS — D631 Anemia in chronic kidney disease: Secondary | ICD-10-CM | POA: Diagnosis not present

## 2023-06-15 DIAGNOSIS — Z79899 Other long term (current) drug therapy: Secondary | ICD-10-CM

## 2023-06-15 DIAGNOSIS — G40409 Other generalized epilepsy and epileptic syndromes, not intractable, without status epilepticus: Secondary | ICD-10-CM | POA: Diagnosis present

## 2023-06-15 DIAGNOSIS — T17500A Unspecified foreign body in bronchus causing asphyxiation, initial encounter: Secondary | ICD-10-CM | POA: Diagnosis not present

## 2023-06-15 DIAGNOSIS — I2510 Atherosclerotic heart disease of native coronary artery without angina pectoris: Secondary | ICD-10-CM | POA: Diagnosis present

## 2023-06-15 DIAGNOSIS — J9601 Acute respiratory failure with hypoxia: Secondary | ICD-10-CM | POA: Diagnosis not present

## 2023-06-15 DIAGNOSIS — I6782 Cerebral ischemia: Secondary | ICD-10-CM | POA: Diagnosis not present

## 2023-06-15 DIAGNOSIS — Z1152 Encounter for screening for COVID-19: Secondary | ICD-10-CM | POA: Diagnosis not present

## 2023-06-15 DIAGNOSIS — M109 Gout, unspecified: Secondary | ICD-10-CM | POA: Diagnosis present

## 2023-06-15 DIAGNOSIS — N179 Acute kidney failure, unspecified: Secondary | ICD-10-CM | POA: Diagnosis not present

## 2023-06-15 DIAGNOSIS — N1831 Chronic kidney disease, stage 3a: Secondary | ICD-10-CM | POA: Diagnosis present

## 2023-06-15 DIAGNOSIS — A4152 Sepsis due to Pseudomonas: Secondary | ICD-10-CM | POA: Diagnosis present

## 2023-06-15 DIAGNOSIS — Z515 Encounter for palliative care: Secondary | ICD-10-CM

## 2023-06-15 DIAGNOSIS — K859 Acute pancreatitis without necrosis or infection, unspecified: Secondary | ICD-10-CM | POA: Diagnosis not present

## 2023-06-15 DIAGNOSIS — K719 Toxic liver disease, unspecified: Secondary | ICD-10-CM | POA: Diagnosis present

## 2023-06-15 DIAGNOSIS — R0602 Shortness of breath: Secondary | ICD-10-CM | POA: Diagnosis not present

## 2023-06-15 DIAGNOSIS — E7800 Pure hypercholesterolemia, unspecified: Secondary | ICD-10-CM | POA: Diagnosis not present

## 2023-06-15 DIAGNOSIS — I952 Hypotension due to drugs: Secondary | ICD-10-CM | POA: Diagnosis not present

## 2023-06-15 DIAGNOSIS — R0989 Other specified symptoms and signs involving the circulatory and respiratory systems: Secondary | ICD-10-CM | POA: Diagnosis not present

## 2023-06-15 DIAGNOSIS — I5021 Acute systolic (congestive) heart failure: Secondary | ICD-10-CM | POA: Diagnosis not present

## 2023-06-15 DIAGNOSIS — M48061 Spinal stenosis, lumbar region without neurogenic claudication: Secondary | ICD-10-CM | POA: Diagnosis not present

## 2023-06-15 DIAGNOSIS — R001 Bradycardia, unspecified: Secondary | ICD-10-CM | POA: Diagnosis not present

## 2023-06-15 DIAGNOSIS — J9602 Acute respiratory failure with hypercapnia: Secondary | ICD-10-CM | POA: Diagnosis not present

## 2023-06-15 DIAGNOSIS — Z825 Family history of asthma and other chronic lower respiratory diseases: Secondary | ICD-10-CM

## 2023-06-15 DIAGNOSIS — Z8249 Family history of ischemic heart disease and other diseases of the circulatory system: Secondary | ICD-10-CM

## 2023-06-15 DIAGNOSIS — E8729 Other acidosis: Secondary | ICD-10-CM | POA: Diagnosis present

## 2023-06-15 DIAGNOSIS — E875 Hyperkalemia: Secondary | ICD-10-CM | POA: Diagnosis not present

## 2023-06-15 DIAGNOSIS — C8330 Diffuse large B-cell lymphoma, unspecified site: Secondary | ICD-10-CM | POA: Diagnosis not present

## 2023-06-15 DIAGNOSIS — R579 Shock, unspecified: Secondary | ICD-10-CM | POA: Diagnosis not present

## 2023-06-15 DIAGNOSIS — I5043 Acute on chronic combined systolic (congestive) and diastolic (congestive) heart failure: Secondary | ICD-10-CM | POA: Diagnosis not present

## 2023-06-15 DIAGNOSIS — Z9221 Personal history of antineoplastic chemotherapy: Secondary | ICD-10-CM

## 2023-06-15 DIAGNOSIS — M4856XA Collapsed vertebra, not elsewhere classified, lumbar region, initial encounter for fracture: Secondary | ICD-10-CM | POA: Diagnosis not present

## 2023-06-15 DIAGNOSIS — R443 Hallucinations, unspecified: Secondary | ICD-10-CM | POA: Diagnosis present

## 2023-06-15 DIAGNOSIS — R6889 Other general symptoms and signs: Secondary | ICD-10-CM | POA: Diagnosis not present

## 2023-06-15 DIAGNOSIS — N183 Chronic kidney disease, stage 3 unspecified: Secondary | ICD-10-CM | POA: Diagnosis not present

## 2023-06-15 DIAGNOSIS — R0609 Other forms of dyspnea: Secondary | ICD-10-CM | POA: Diagnosis not present

## 2023-06-15 DIAGNOSIS — T420X1A Poisoning by hydantoin derivatives, accidental (unintentional), initial encounter: Principal | ICD-10-CM

## 2023-06-15 DIAGNOSIS — K7689 Other specified diseases of liver: Secondary | ICD-10-CM | POA: Diagnosis not present

## 2023-06-15 DIAGNOSIS — R569 Unspecified convulsions: Secondary | ICD-10-CM | POA: Diagnosis not present

## 2023-06-15 DIAGNOSIS — D329 Benign neoplasm of meninges, unspecified: Secondary | ICD-10-CM | POA: Diagnosis not present

## 2023-06-15 DIAGNOSIS — Z66 Do not resuscitate: Secondary | ICD-10-CM | POA: Diagnosis not present

## 2023-06-15 DIAGNOSIS — Z7902 Long term (current) use of antithrombotics/antiplatelets: Secondary | ICD-10-CM

## 2023-06-15 DIAGNOSIS — I739 Peripheral vascular disease, unspecified: Secondary | ICD-10-CM | POA: Diagnosis not present

## 2023-06-15 DIAGNOSIS — M4854XA Collapsed vertebra, not elsewhere classified, thoracic region, initial encounter for fracture: Secondary | ICD-10-CM | POA: Diagnosis present

## 2023-06-15 DIAGNOSIS — R109 Unspecified abdominal pain: Secondary | ICD-10-CM | POA: Diagnosis not present

## 2023-06-15 DIAGNOSIS — I255 Ischemic cardiomyopathy: Secondary | ICD-10-CM | POA: Diagnosis present

## 2023-06-15 DIAGNOSIS — Z85528 Personal history of other malignant neoplasm of kidney: Secondary | ICD-10-CM

## 2023-06-15 DIAGNOSIS — D320 Benign neoplasm of cerebral meninges: Secondary | ICD-10-CM | POA: Diagnosis not present

## 2023-06-15 DIAGNOSIS — F419 Anxiety disorder, unspecified: Secondary | ICD-10-CM | POA: Diagnosis present

## 2023-06-15 DIAGNOSIS — Z7189 Other specified counseling: Secondary | ICD-10-CM | POA: Diagnosis not present

## 2023-06-15 DIAGNOSIS — R4182 Altered mental status, unspecified: Secondary | ICD-10-CM | POA: Diagnosis present

## 2023-06-15 DIAGNOSIS — I130 Hypertensive heart and chronic kidney disease with heart failure and stage 1 through stage 4 chronic kidney disease, or unspecified chronic kidney disease: Secondary | ICD-10-CM | POA: Diagnosis not present

## 2023-06-15 DIAGNOSIS — J90 Pleural effusion, not elsewhere classified: Secondary | ICD-10-CM | POA: Diagnosis not present

## 2023-06-15 DIAGNOSIS — R918 Other nonspecific abnormal finding of lung field: Secondary | ICD-10-CM | POA: Diagnosis not present

## 2023-06-15 DIAGNOSIS — J151 Pneumonia due to Pseudomonas: Secondary | ICD-10-CM | POA: Diagnosis present

## 2023-06-15 DIAGNOSIS — J9811 Atelectasis: Secondary | ICD-10-CM | POA: Diagnosis not present

## 2023-06-15 DIAGNOSIS — R404 Transient alteration of awareness: Secondary | ICD-10-CM | POA: Diagnosis not present

## 2023-06-15 DIAGNOSIS — R0689 Other abnormalities of breathing: Secondary | ICD-10-CM | POA: Diagnosis not present

## 2023-06-15 DIAGNOSIS — Z823 Family history of stroke: Secondary | ICD-10-CM

## 2023-06-15 DIAGNOSIS — C8513 Unspecified B-cell lymphoma, intra-abdominal lymph nodes: Secondary | ICD-10-CM | POA: Diagnosis not present

## 2023-06-15 DIAGNOSIS — R21 Rash and other nonspecific skin eruption: Secondary | ICD-10-CM | POA: Diagnosis not present

## 2023-06-15 DIAGNOSIS — R9089 Other abnormal findings on diagnostic imaging of central nervous system: Secondary | ICD-10-CM | POA: Diagnosis not present

## 2023-06-15 DIAGNOSIS — Z8349 Family history of other endocrine, nutritional and metabolic diseases: Secondary | ICD-10-CM

## 2023-06-15 DIAGNOSIS — Z7952 Long term (current) use of systemic steroids: Secondary | ICD-10-CM

## 2023-06-15 DIAGNOSIS — Z905 Acquired absence of kidney: Secondary | ICD-10-CM

## 2023-06-15 DIAGNOSIS — J9690 Respiratory failure, unspecified, unspecified whether with hypoxia or hypercapnia: Secondary | ICD-10-CM | POA: Diagnosis not present

## 2023-06-15 DIAGNOSIS — K5710 Diverticulosis of small intestine without perforation or abscess without bleeding: Secondary | ICD-10-CM | POA: Diagnosis present

## 2023-06-15 DIAGNOSIS — M546 Pain in thoracic spine: Secondary | ICD-10-CM | POA: Diagnosis not present

## 2023-06-15 DIAGNOSIS — Z4682 Encounter for fitting and adjustment of non-vascular catheter: Secondary | ICD-10-CM | POA: Diagnosis not present

## 2023-06-15 DIAGNOSIS — J9691 Respiratory failure, unspecified with hypoxia: Secondary | ICD-10-CM | POA: Diagnosis not present

## 2023-06-15 LAB — CBC WITH DIFFERENTIAL/PLATELET
Abs Immature Granulocytes: 0.07 10*3/uL (ref 0.00–0.07)
Basophils Absolute: 0.1 10*3/uL (ref 0.0–0.1)
Basophils Relative: 1 %
Eosinophils Absolute: 0.5 10*3/uL (ref 0.0–0.5)
Eosinophils Relative: 3 %
HCT: 36.9 % — ABNORMAL LOW (ref 39.0–52.0)
Hemoglobin: 11 g/dL — ABNORMAL LOW (ref 13.0–17.0)
Immature Granulocytes: 1 %
Lymphocytes Relative: 19 %
Lymphs Abs: 2.9 10*3/uL (ref 0.7–4.0)
MCH: 28.8 pg (ref 26.0–34.0)
MCHC: 29.8 g/dL — ABNORMAL LOW (ref 30.0–36.0)
MCV: 96.6 fL (ref 80.0–100.0)
Monocytes Absolute: 1.2 10*3/uL — ABNORMAL HIGH (ref 0.1–1.0)
Monocytes Relative: 8 %
Neutro Abs: 10.7 10*3/uL — ABNORMAL HIGH (ref 1.7–7.7)
Neutrophils Relative %: 68 %
Platelets: 202 10*3/uL (ref 150–400)
RBC: 3.82 MIL/uL — ABNORMAL LOW (ref 4.22–5.81)
RDW: 17 % — ABNORMAL HIGH (ref 11.5–15.5)
WBC: 15.5 10*3/uL — ABNORMAL HIGH (ref 4.0–10.5)
nRBC: 0 % (ref 0.0–0.2)

## 2023-06-15 LAB — URINALYSIS, W/ REFLEX TO CULTURE (INFECTION SUSPECTED)
Bilirubin Urine: NEGATIVE
Glucose, UA: NEGATIVE mg/dL
Ketones, ur: NEGATIVE mg/dL
Leukocytes,Ua: NEGATIVE
Nitrite: NEGATIVE
Protein, ur: NEGATIVE mg/dL
Specific Gravity, Urine: 1.018 (ref 1.005–1.030)
pH: 5 (ref 5.0–8.0)

## 2023-06-15 LAB — PROTIME-INR
INR: 1.4 — ABNORMAL HIGH (ref 0.8–1.2)
Prothrombin Time: 17.5 s — ABNORMAL HIGH (ref 11.4–15.2)

## 2023-06-15 LAB — BLOOD GAS, VENOUS
Acid-base deficit: 11.5 mmol/L — ABNORMAL HIGH (ref 0.0–2.0)
Bicarbonate: 19.8 mmol/L — ABNORMAL LOW (ref 20.0–28.0)
FIO2: 35 %
O2 Saturation: 93 %
Patient temperature: 37
pCO2, Ven: 70 mm[Hg] — ABNORMAL HIGH (ref 44–60)
pH, Ven: 7.06 — CL (ref 7.25–7.43)
pO2, Ven: 68 mm[Hg] — ABNORMAL HIGH (ref 32–45)

## 2023-06-15 LAB — COMPREHENSIVE METABOLIC PANEL
ALT: 44 U/L (ref 0–44)
AST: 73 U/L — ABNORMAL HIGH (ref 15–41)
Albumin: 2.5 g/dL — ABNORMAL LOW (ref 3.5–5.0)
Alkaline Phosphatase: 255 U/L — ABNORMAL HIGH (ref 38–126)
Anion gap: 9 (ref 5–15)
BUN: 52 mg/dL — ABNORMAL HIGH (ref 8–23)
CO2: 23 mmol/L (ref 22–32)
Calcium: 8.2 mg/dL — ABNORMAL LOW (ref 8.9–10.3)
Chloride: 107 mmol/L (ref 98–111)
Creatinine, Ser: 2.15 mg/dL — ABNORMAL HIGH (ref 0.61–1.24)
GFR, Estimated: 32 mL/min — ABNORMAL LOW (ref >60)
Glucose, Bld: 130 mg/dL — ABNORMAL HIGH (ref 70–99)
Potassium: 5.3 mmol/L — ABNORMAL HIGH (ref 3.5–5.1)
Sodium: 139 mmol/L (ref 135–145)
Total Bilirubin: 0.8 mg/dL (ref <1.2)
Total Protein: 5.3 g/dL — ABNORMAL LOW (ref 6.5–8.1)

## 2023-06-15 LAB — T4, FREE: Free T4: 0.82 ng/dL (ref 0.61–1.12)

## 2023-06-15 LAB — TROPONIN I (HIGH SENSITIVITY)
Troponin I (High Sensitivity): 30 ng/L — ABNORMAL HIGH (ref <18)
Troponin I (High Sensitivity): 41 ng/L — ABNORMAL HIGH (ref <18)

## 2023-06-15 LAB — LACTIC ACID, PLASMA
Lactic Acid, Venous: 1.5 mmol/L (ref 0.5–1.9)
Lactic Acid, Venous: 0.8 mmol/L (ref 0.5–1.9)

## 2023-06-15 LAB — PHENYTOIN LEVEL, TOTAL: Phenytoin Lvl: 23.5 ug/mL — ABNORMAL HIGH (ref 10.0–20.0)

## 2023-06-15 LAB — TSH: TSH: 2.141 u[IU]/mL (ref 0.350–4.500)

## 2023-06-15 LAB — APTT: aPTT: 42 s — ABNORMAL HIGH (ref 24–36)

## 2023-06-15 MED ORDER — SODIUM CHLORIDE 0.9 % IV SOLN: 250.0000 mL | INTRAVENOUS | Status: AC

## 2023-06-15 MED ORDER — SODIUM CHLORIDE 0.9 % IV SOLN
2.0000 g | Freq: Once | INTRAVENOUS | Status: AC
  Administered 2023-06-15: 2 g via INTRAVENOUS
  Filled 2023-06-15: qty 12.5

## 2023-06-15 MED ORDER — POLYETHYLENE GLYCOL 3350 17 G PO PACK
17.0000 g | PACK | Freq: Every day | ORAL | Status: DC
  Administered 2023-06-16 – 2023-06-17 (×2): 17 g
  Filled 2023-06-15 (×2): qty 1

## 2023-06-15 MED ORDER — ETOMIDATE 2 MG/ML IV SOLN: 25.0000 mg | Freq: Once | INTRAVENOUS | Status: AC

## 2023-06-15 MED ORDER — SODIUM CHLORIDE 0.9 % IV BOLUS (SEPSIS)
1000.0000 mL | Freq: Once | INTRAVENOUS | Status: AC
  Administered 2023-06-15: 1000 mL via INTRAVENOUS

## 2023-06-15 MED ORDER — HALOPERIDOL LACTATE 5 MG/ML IJ SOLN
2.0000 mg | Freq: Once | INTRAMUSCULAR | Status: AC
  Administered 2023-06-15: 2 mg via INTRAVENOUS

## 2023-06-15 MED ORDER — MIDAZOLAM HCL 2 MG/2ML IJ SOLN
1.0000 mg | INTRAMUSCULAR | Status: DC | PRN
  Administered 2023-06-16: 2 mg via INTRAVENOUS
  Administered 2023-06-16: 1 mg via INTRAVENOUS
  Filled 2023-06-15 (×4): qty 2

## 2023-06-15 MED ORDER — FAMOTIDINE 20 MG PO TABS: 20.0000 mg | ORAL_TABLET | Freq: Two times a day (BID) | ORAL | Status: DC

## 2023-06-15 MED ORDER — ETOMIDATE 2 MG/ML IV SOLN
INTRAVENOUS | Status: AC
  Administered 2023-06-15: 25 mg via INTRAVENOUS
  Filled 2023-06-15: qty 10

## 2023-06-15 MED ORDER — METRONIDAZOLE 500 MG/100ML IV SOLN
500.0000 mg | Freq: Once | INTRAVENOUS | Status: AC
  Administered 2023-06-15: 500 mg via INTRAVENOUS
  Filled 2023-06-15: qty 100

## 2023-06-15 MED ORDER — POLYETHYLENE GLYCOL 3350 17 G PO PACK: 17.0000 g | PACK | Freq: Every day | ORAL | Status: DC | PRN

## 2023-06-15 MED ORDER — SODIUM CHLORIDE 0.9% FLUSH
10.0000 mL | Freq: Two times a day (BID) | INTRAVENOUS | Status: DC
  Administered 2023-06-15 – 2023-06-24 (×18): 10 mL via INTRAVENOUS

## 2023-06-15 MED ORDER — SODIUM CHLORIDE 0.9 % IV BOLUS
1000.0000 mL | Freq: Once | INTRAVENOUS | Status: AC
  Administered 2023-06-15: 1000 mL via INTRAVENOUS

## 2023-06-15 MED ORDER — SUCCINYLCHOLINE CHLORIDE 200 MG/10ML IV SOSY
PREFILLED_SYRINGE | INTRAVENOUS | Status: AC
  Administered 2023-06-15: 120 mg via INTRAVENOUS
  Filled 2023-06-15: qty 10

## 2023-06-15 MED ORDER — HEPARIN SODIUM (PORCINE) 5000 UNIT/ML IJ SOLN
5000.0000 [IU] | Freq: Three times a day (TID) | INTRAMUSCULAR | Status: DC
  Administered 2023-06-16 – 2023-06-22 (×19): 5000 [IU] via SUBCUTANEOUS
  Filled 2023-06-15 (×19): qty 1

## 2023-06-15 MED ORDER — FENTANYL CITRATE PF 50 MCG/ML IJ SOSY
25.0000 ug | PREFILLED_SYRINGE | INTRAMUSCULAR | Status: DC | PRN
  Administered 2023-06-16: 50 ug via INTRAVENOUS
  Administered 2023-06-16 (×2): 100 ug via INTRAVENOUS
  Filled 2023-06-15: qty 2
  Filled 2023-06-15: qty 1
  Filled 2023-06-15: qty 2

## 2023-06-15 MED ORDER — NOREPINEPHRINE 4 MG/250ML-% IV SOLN
INTRAVENOUS | Status: AC
  Administered 2023-06-15: 2 ug/min via INTRAVENOUS
  Filled 2023-06-15: qty 250

## 2023-06-15 MED ORDER — VANCOMYCIN HCL IN DEXTROSE 1-5 GM/200ML-% IV SOLN
1000.0000 mg | Freq: Once | INTRAVENOUS | Status: AC
  Administered 2023-06-15: 1000 mg via INTRAVENOUS
  Filled 2023-06-15: qty 200

## 2023-06-15 MED ORDER — FENTANYL CITRATE PF 50 MCG/ML IJ SOSY: 25.0000 ug | PREFILLED_SYRINGE | INTRAMUSCULAR | Status: DC | PRN

## 2023-06-15 MED ORDER — SUCCINYLCHOLINE CHLORIDE 200 MG/10ML IV SOSY: 120.0000 mg | PREFILLED_SYRINGE | Freq: Once | INTRAVENOUS | Status: AC

## 2023-06-15 MED ORDER — CHLORHEXIDINE GLUCONATE CLOTH 2 % EX PADS
6.0000 | MEDICATED_PAD | Freq: Every day | CUTANEOUS | Status: DC
  Administered 2023-06-16 – 2023-06-17 (×3): 6 via TOPICAL

## 2023-06-15 MED ORDER — NOREPINEPHRINE 4 MG/250ML-% IV SOLN: 2.0000 ug/min | INTRAVENOUS | Status: DC

## 2023-06-15 MED ORDER — DOCUSATE SODIUM 100 MG PO CAPS: 100.0000 mg | ORAL_CAPSULE | Freq: Two times a day (BID) | ORAL | Status: DC | PRN

## 2023-06-15 MED ORDER — DOCUSATE SODIUM 50 MG/5ML PO LIQD
100.0000 mg | Freq: Two times a day (BID) | ORAL | Status: DC
  Administered 2023-06-16 – 2023-06-17 (×3): 100 mg
  Filled 2023-06-15 (×3): qty 10

## 2023-06-15 NOTE — ED Notes (Signed)
Pt placed on bear warmer

## 2023-06-15 NOTE — ED Provider Notes (Signed)
[facility] Provider Note    Event Date/Time   First MD Initiated Contact with Patient 06/15/23 1801     (approximate)   History   Unresponsive    HPI  Gilbert [name] is a 72 y.o. male past medical history significant for diffuse large B cell lymphoma, CAD, hypertension, right renal cancer, seizure disorder on Dilantin who presents to the emergency department with altered mental status.  History is provided by EMS and 2 daughters at bedside.  Stated that the patient has been having intermittent episodes of confusion with hallucinations since he was discharged from the hospital this past October.  Endorses approximately 7 months of a diffuse rash to his body.  Recently had a biopsy with dermatology and was told it was secondary to Dilantin.  Patient has a history of seizure disorder, childhood seizures and last seizure was approximately 25 years ago.  Does not follow with a neurologist.  States that over the past 1 week he has been having worsening dizziness, trouble walking with his legs bilaterally.  Tonight had significantly worsening altered mental status.  Was hallucinating, became combative.  Was rolling around on the ground and attempting to get in his gun safe.  When EMS arrived required patient was agitated and combative.  Given IM Versed 5 mg, 2.5 of IM Haldol.  Patient became apneic requiring BVM.  Then spontaneously breathing with nonrebreather in place on arrival.   Physical Exam   Triage Vital Signs: ED Triage Vitals [06/15/23 1800]  Encounter Vitals Group     BP (!) 113/57     Systolic BP Percentile      Diastolic BP Percentile      Pulse Rate (!) 56     Resp 18     Temp      Temp src      SpO2 100 %     Weight      Height      Head Circumference      Peak Flow      Pain Score      Pain Loc      Pain Education      Exclude from Growth Chart     Most recent vital signs: Vitals:   06/15/23 2347 06/15/23 2350  BP: (!) 80/46 (!) 78/46   Pulse: (!) 56 (!) 54  Resp: 20 20  Temp: (!) 94.9 F (34.9 C) (!) 94.9 F (34.9 C)  SpO2: 100% 100%    Physical Exam Constitutional:      Appearance: He is well-developed. He is ill-appearing.     Comments: GCS of 3.  Pupils are equal and reactive.  Cool to touch, rectal temperature 92.3  HENT:     Head: Atraumatic.  Eyes:     Extraocular Movements: Extraocular movements intact.     Conjunctiva/sclera: Conjunctivae normal.  Cardiovascular:     Rate and Rhythm: Regular rhythm. Bradycardia present.  Pulmonary:     Effort: No respiratory distress.     Breath sounds: No wheezing.     Comments: Nonrebreather mask Abdominal:     Tenderness: There is no abdominal tenderness.  Skin:    Findings: Rash present.     Comments: Diffuse rash to the entire body, no purpura, no desquamation  Neurological:     Mental Status: Mental status is at baseline.     GCS: GCS eye subscore is 1. GCS verbal subscore is 1. GCS motor subscore is 1.     IMPRESSION / MDM /  ASSESSMENT AND PLAN / ED COURSE  I reviewed the triage vital signs and the nursing notes.  Differential diagnosis including intracranial hemorrhage, electrolyte abnormality, sepsis, myxedema coma, Dilantin toxicity, CVA  EKG  I, Corena Herter, the attending physician, personally viewed and interpreted this ECG.  Rate of 56.  Prolonged PR interval at 224.  QTc 473.  Left bundle branch block with negative Sgarbossa's criteria.  T waves inverted to the inferior and lateral leads.  Sinus bradycardia while on cardiac telemetry.  RADIOLOGY I independently reviewed imaging, my interpretation of imaging: CT scan of the head without signs of intracranial hemorrhage.  Read as chronic meningioma to the anterior left falx now measuring 1.9 cm with mild mass effect to the frontal lobe.  LABS (all labs ordered are listed, but only abnormal results are displayed) Labs interpreted as -    Labs Reviewed  COMPREHENSIVE METABOLIC PANEL -  Abnormal; Notable for the following components:      Result Value   Potassium 5.3 (*)    Glucose, Bld 130 (*)    BUN 52 (*)    Creatinine, Ser 2.15 (*)    Calcium 8.2 (*)    Total Protein 5.3 (*)    Albumin 2.5 (*)    AST 73 (*)    Alkaline Phosphatase 255 (*)    GFR, Estimated 32 (*)    All other components within normal limits  PROTIME-INR - Abnormal; Notable for the following components:   Prothrombin Time 17.5 (*)    INR 1.4 (*)    All other components within normal limits  APTT - Abnormal; Notable for the following components:   aPTT 42 (*)    All other components within normal limits  BLOOD GAS, VENOUS - Abnormal; Notable for the following components:   pH, Ven 7.07 (*)    pCO2, Ven 78 (*)    pO2, Ven 183 (*)    Acid-base deficit 9.0 (*)    All other components within normal limits  URINALYSIS, W/ REFLEX TO CULTURE (INFECTION SUSPECTED) - Abnormal; Notable for the following components:   Color, Urine YELLOW (*)    APPearance HAZY (*)    Hgb urine dipstick MODERATE (*)    Bacteria, UA RARE (*)    All other components within normal limits  PHENYTOIN LEVEL, TOTAL - Abnormal; Notable for the following components:   Phenytoin Lvl 23.5 (*)    All other components within normal limits  CBC WITH DIFFERENTIAL/PLATELET - Abnormal; Notable for the following components:   WBC 15.5 (*)    RBC 3.82 (*)    Hemoglobin 11.0 (*)    HCT 36.9 (*)    MCHC 29.8 (*)    RDW 17.0 (*)    Neutro Abs 10.7 (*)    Monocytes Absolute 1.2 (*)    All other components within normal limits  BLOOD GAS, VENOUS - Abnormal; Notable for the following components:   pH, Ven 7.06 (*)    pCO2, Ven 70 (*)    pO2, Ven 68 (*)    Bicarbonate 19.8 (*)    Acid-base deficit 11.5 (*)    All other components within normal limits  TROPONIN I (HIGH SENSITIVITY) - Abnormal; Notable for the following components:   Troponin I (High Sensitivity) 30 (*)    All other components within normal limits  TROPONIN I (HIGH  SENSITIVITY) - Abnormal; Notable for the following components:   Troponin I (High Sensitivity) 41 (*)    All other components within normal limits  CULTURE, BLOOD (SINGLE)  CULTURE, BLOOD (SINGLE)  URINE CULTURE  SARS CORONAVIRUS 2 BY RT PCR  RESPIRATORY PANEL BY PCR  MRSA NEXT GEN BY PCR, NASAL  LACTIC ACID, PLASMA  LACTIC ACID, PLASMA  TSH  T4, FREE  CBC WITH DIFFERENTIAL/PLATELET  CBC  MAGNESIUM  PHOSPHORUS  PROCALCITONIN  MAGNESIUM  PHOSPHORUS  URINE DRUG SCREEN, QUALITATIVE ([facility] ONLY)     MDM    On arrival patient hypothermic with altered mental status.  Sepsis order set initiated.  Felt that 30 cc/kg of IV fluids Pourciau be detrimental, given 1 L fluid and will reevaluate.  Patient placed on a Lawyer.  Continued to be hypothermic with a rectal temp down to 91.1, given another 1 L of warmed saline.  Started on broad-spectrum antibiotics.  Normal thyroid studies, clinical picture is not consistent with myxedema coma.  Plan toxicity but would not be at a level that I would anticipate would cause hallucinations or altered mental status.  Patient placed on BiPAP, concerned that his hypercarbia is secondary to medication side effect from his midazolam and sedation.  Remained on BiPAP for multiple hours.  Repeat VBG continues to be hypercarbic.  Consulted ICU for admission.  Plan for intubation and CT PE study.  Recommended MRI with contrast based on his CT scan of the head which was concerning for possible meningioma with mass effect.  MRI with contrast ordered.  Patient admitted to the ICU.   PROCEDURES:  Critical Care performed: yes  .Critical Care  Performed by: Corena Herter, MD Authorized by: Corena Herter, MD   Critical care provider statement:    Critical care time (minutes):  45   Critical care time was exclusive of:  Separately billable procedures and treating other patients   Critical care was necessary to treat or prevent imminent or life-threatening  deterioration of the following conditions:  Circulatory failure and respiratory failure   Critical care was time spent personally by me on the following activities:  Development of treatment plan with patient or surrogate, discussions with consultants, evaluation of patient's response to treatment, examination of patient, ordering and review of laboratory studies, ordering and review of radiographic studies, ordering and performing treatments and interventions, pulse oximetry, re-evaluation of patient's condition and review of old charts   Patient's presentation is most consistent with acute presentation with potential threat to life or bodily function.   MEDICATIONS ORDERED IN ED: Medications  sodium chloride flush (NS) 0.9 % injection 10 mL (10 mLs Intravenous Given 06/15/23 2159)  docusate sodium (COLACE) capsule 100 mg (has no administration in time range)  polyethylene glycol (MIRALAX / GLYCOLAX) packet 17 g (has no administration in time range)  heparin injection 5,000 Units (has no administration in time range)  famotidine (PEPCID) tablet 20 mg (has no administration in time range)  docusate (COLACE) 50 MG/5ML liquid 100 mg (has no administration in time range)  polyethylene glycol (MIRALAX / GLYCOLAX) packet 17 g (has no administration in time range)  fentaNYL (SUBLIMAZE) injection 25 mcg (has no administration in time range)  fentaNYL (SUBLIMAZE) injection 25-100 mcg (has no administration in time range)  midazolam (VERSED) injection 1-2 mg (has no administration in time range)  0.9 %  sodium chloride infusion (has no administration in time range)  norepinephrine (LEVOPHED) 4mg  in (0.016 mg/mL) premix infusion (has no administration in time range)  Chlorhexidine Gluconate Cloth 2 % PADS 6 each (has no administration in time range)  norepinephrine (LEVOPHED) 4-5 MG/250ML-% infusion SOLN (has no  administration in time range)  sodium chloride 0.9 % bolus 1,000 mL (0 mLs Intravenous  Stopped 06/15/23 2006)  ceFEPIme (MAXIPIME) 2 g in sodium chloride 0.9 % 100 mL IVPB (0 g Intravenous Stopped 06/15/23 1924)  metroNIDAZOLE (FLAGYL) IVPB 500 mg (0 mg Intravenous Stopped 06/15/23 1932)  vancomycin (VANCOCIN) IVPB 1000 mg/200 mL premix (0 mg Intravenous Stopped 06/15/23 2032)  haloperidol lactate (HALDOL) injection 2 mg (2 mg Intravenous Given 06/15/23 1907)  sodium chloride 0.9 % bolus 1,000 mL (0 mLs Intravenous Stopped 06/15/23 2159)  etomidate (AMIDATE) injection 25 mg (25 mg Intravenous Given 06/15/23 2339)  succinylcholine (ANECTINE) syringe 120 mg (120 mg Intravenous Given 06/15/23 2339)    FINAL CLINICAL IMPRESSION(S) / ED DIAGNOSES   Final diagnoses:  Accidental phenytoin poisoning, initial encounter  Hallucination  Hypothermia, initial encounter     Rx / DC Orders   ED Discharge Orders     None        Note:  This document was prepared using Dragon voice recognition software and Steinhart include unintentional dictation errors.   Corena Herter, MD 06/15/23 [phone]

## 2023-06-15 NOTE — Progress Notes (Signed)
[facility] is following code sepsis 

## 2023-06-15 NOTE — Telephone Encounter (Signed)
Please call and check status.  I did speak to [facility] yesterday, see prior note.  My understanding was that he had progressive weakness in his legs and was unable to bear weight on his legs over the previous few days, which was a progression and worsening of his symptoms.  He also continued to have hallucinations off hydroxyzine and Benadryl to my understanding yesterday.  This was my reasoning for emergency room evaluation for possible delirium, with multiple possible contributors including his previous acute kidney injury.  I understand hallucinations had been there previously, but unlikely due to hydroxyzine and Benadryl if symptoms continued off those medications.  Initially my understanding was that there was not a sign of urinary tract infection noted on his urgent care evaluation, but they went ahead and treated him with an antibiotic just in case while waiting on the culture.  Without definitive UTI, recommended ER evaluation for other causes of delirium.  Let me know if I misunderstood the description yesterday or the progression of his symptoms.

## 2023-06-15 NOTE — ED Notes (Signed)
While receiving report from off going RN, pt combative. Thrashing in bed, pulling at lines and foley. Pt unable to be redirected and is not responsive to commands. Mittens applied bilaterally to pt to promote pt safety and preserve integrity of necessary lines for treatment. MD called to bedside to evaluate pt behavior and order was given for 2 mg IV Haldol. Medication was administered and pt behavioral challenges improved. Pt remains with bear hugger in use, full monitor in place, and Bipap in place. RN to call for RT assistance to get pt to CT.

## 2023-06-15 NOTE — ED Notes (Signed)
ED Provider at bedside. 

## 2023-06-15 NOTE — ED Notes (Signed)
MD notified that pt temp 91.1 where initial temp was 92.3. both values collected via rectal temp. Bear hugger remains in place on the highest setting. MD placing order for an additional liter of IVF that is warmed.

## 2023-06-15 NOTE — Progress Notes (Signed)
This is Gilbert [name] from [facility], following this code sepsis. Please draw blood cultures and initial lactate as soon as possible, then hang antibiotic as soon as labs are drawn. If you have any questions please call [facility].

## 2023-06-15 NOTE — ED Notes (Signed)
2 RT, 2 RN, and MD at bedside to intubate pt.

## 2023-06-15 NOTE — Progress Notes (Signed)
Critical VBG results given to RN and MD. Dr Arnoldo Morale wanted to try bipap to treat the patient at this time.

## 2023-06-15 NOTE — ED Notes (Signed)
Patient transported to CT with RN and RT on monitor 
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
## 2023-06-15 NOTE — ED Notes (Signed)
RN awaiting RT to assist with pt transport to CT

## 2023-06-15 NOTE — ED Provider Notes (Signed)
Procedure Name: Intubation Date/Time: 06/15/2023 11:45 PM  Performed by: Kasandra Fehr, Layla Maw, DOPre-anesthesia Checklist: Patient identified, Emergency Drugs available, Suction available, Timeout performed and Patient being monitored Oxygen Delivery Method: Ambu bag Preoxygenation: Pre-oxygenation with 100% oxygen Induction Type: IV induction, Rapid sequence and Cricoid Pressure applied Ventilation: Mask ventilation without difficulty Laryngoscope Size: Glidescope and 3 Grade View: Grade II Tube size: 7.5 mm Number of attempts: 1 Placement Confirmation: ETT inserted through vocal cords under direct vision, Positive ETCO2 and Breath sounds checked- equal and bilateral Secured at: 25 cm Tube secured with: ETT holder    Patient here for altered mental status.  Respiratory acidosis on VBG.  No significant improvement on BiPAP.  Intubated with family in agreement.  ICU to admit.    Gilbert [name], Layla Maw, DO 06/15/23 [phone]

## 2023-06-15 NOTE — ED Notes (Signed)
RT at bedside; pt placed on bipap 
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
## 2023-06-15 NOTE — Telephone Encounter (Signed)
Again no answer at either preferred phone number

## 2023-06-15 NOTE — Progress Notes (Signed)
CODE SEPSIS - PHARMACY COMMUNICATION  **Broad Spectrum Antibiotics should be administered within 1 hour of Sepsis diagnosis**  Time Code Sepsis Called/Page Received: 1825  Antibiotics Ordered: vancomycin and cefepime  Time of 1st antibiotic administration: 1854  Additional action taken by pharmacy: None  If necessary, Name of Provider/Nurse Contacted: None    Rockwell Alexandria ,PharmD Clinical Pharmacist  06/15/2023  6:55 PM

## 2023-06-15 NOTE — Progress Notes (Signed)
PHARMACY -  BRIEF ANTIBIOTIC NOTE   Pharmacy has received consult(s) for sepsis from an ED provider.  The patient's profile has been reviewed for ht/wt/allergies/indication/available labs.    One time order(s) placed for vancomycin, metronidazole and cefepime.  Further antibiotics/pharmacy consults should be ordered by admitting physician if indicated.                       Thank you, Rockwell Alexandria 06/15/2023  6:29 PM

## 2023-06-16 ENCOUNTER — Inpatient Hospital Stay: Payer: Medicare Other

## 2023-06-16 ENCOUNTER — Ambulatory Visit: Payer: Medicare Other

## 2023-06-16 ENCOUNTER — Telehealth: Payer: Self-pay | Admitting: Family Medicine

## 2023-06-16 DIAGNOSIS — G928 Other toxic encephalopathy: Secondary | ICD-10-CM | POA: Diagnosis not present

## 2023-06-16 DIAGNOSIS — R443 Hallucinations, unspecified: Secondary | ICD-10-CM | POA: Diagnosis not present

## 2023-06-16 DIAGNOSIS — I5021 Acute systolic (congestive) heart failure: Secondary | ICD-10-CM

## 2023-06-16 DIAGNOSIS — N179 Acute kidney failure, unspecified: Secondary | ICD-10-CM

## 2023-06-16 DIAGNOSIS — R4182 Altered mental status, unspecified: Secondary | ICD-10-CM | POA: Diagnosis not present

## 2023-06-16 DIAGNOSIS — T420X1A Poisoning by hydantoin derivatives, accidental (unintentional), initial encounter: Secondary | ICD-10-CM | POA: Diagnosis not present

## 2023-06-16 DIAGNOSIS — N183 Chronic kidney disease, stage 3 unspecified: Secondary | ICD-10-CM

## 2023-06-16 DIAGNOSIS — R569 Unspecified convulsions: Secondary | ICD-10-CM | POA: Diagnosis not present

## 2023-06-16 DIAGNOSIS — J9601 Acute respiratory failure with hypoxia: Secondary | ICD-10-CM | POA: Diagnosis not present

## 2023-06-16 LAB — BLOOD GAS, ARTERIAL
Acid-base deficit: 6.2 mmol/L — ABNORMAL HIGH (ref 0.0–2.0)
Bicarbonate: 17.7 mmol/L — ABNORMAL LOW (ref 20.0–28.0)
FIO2: 35 %
MECHVT: 500 mL
Mechanical Rate: 20
O2 Saturation: 100 %
PEEP: 5 cmH2O
Patient temperature: 37
pCO2 arterial: 30 mm[Hg] — ABNORMAL LOW (ref 32–48)
pH, Arterial: 7.38 (ref 7.35–7.45)
pO2, Arterial: 151 mm[Hg] — ABNORMAL HIGH (ref 83–108)

## 2023-06-16 LAB — GLUCOSE, CAPILLARY
Glucose-Capillary: 102 mg/dL — ABNORMAL HIGH (ref 70–99)
Glucose-Capillary: 106 mg/dL — ABNORMAL HIGH (ref 70–99)
Glucose-Capillary: 114 mg/dL — ABNORMAL HIGH (ref 70–99)
Glucose-Capillary: 100 mg/dL — ABNORMAL HIGH (ref 70–99)
Glucose-Capillary: 121 mg/dL — ABNORMAL HIGH (ref 70–99)
Glucose-Capillary: 133 mg/dL — ABNORMAL HIGH (ref 70–99)

## 2023-06-16 LAB — URINE DRUG SCREEN, QUALITATIVE (ARMC ONLY)
Amphetamines, Ur Screen: NOT DETECTED
Barbiturates, Ur Screen: NOT DETECTED
Benzodiazepine, Ur Scrn: NOT DETECTED
Cannabinoid 50 Ng, Ur ~~LOC~~: NOT DETECTED
Cocaine Metabolite,Ur ~~LOC~~: NOT DETECTED
MDMA (Ecstasy)Ur Screen: NOT DETECTED
Methadone Scn, Ur: NOT DETECTED
Opiate, Ur Screen: NOT DETECTED
Phencyclidine (PCP) Ur S: NOT DETECTED
Tricyclic, Ur Screen: POSITIVE — AB

## 2023-06-16 LAB — AMMONIA: Ammonia: 21 umol/L (ref 9–35)

## 2023-06-16 LAB — VITAMIN B12: Vitamin B-12: 3785 pg/mL — ABNORMAL HIGH (ref 180–914)

## 2023-06-16 LAB — BLOOD GAS, VENOUS
Acid-base deficit: 9 mmol/L — ABNORMAL HIGH (ref 0.0–2.0)
Bicarbonate: 22.6 mmol/L (ref 20.0–28.0)
O2 Saturation: 100 %
Patient temperature: 37
pCO2, Ven: 78 mm[Hg] (ref 44–60)
pH, Ven: 7.07 — CL (ref 7.25–7.43)
pO2, Ven: 183 mmHg — ABNORMAL HIGH (ref 32–45)

## 2023-06-16 LAB — TRIGLYCERIDES: Triglycerides: 159 mg/dL — ABNORMAL HIGH (ref <150)

## 2023-06-16 LAB — RESPIRATORY PANEL BY PCR

## 2023-06-16 LAB — PROCALCITONIN
Procalcitonin: 1.18 ng/mL
Procalcitonin: 1.22 ng/mL

## 2023-06-16 LAB — COMPREHENSIVE METABOLIC PANEL
ALT: 39 U/L (ref 0–44)
AST: 71 U/L — ABNORMAL HIGH (ref 15–41)
Albumin: 2.1 g/dL — ABNORMAL LOW (ref 3.5–5.0)
Alkaline Phosphatase: 213 U/L — ABNORMAL HIGH (ref 38–126)
Anion gap: 9 (ref 5–15)
BUN: 55 mg/dL — ABNORMAL HIGH (ref 8–23)
CO2: 19 mmol/L — ABNORMAL LOW (ref 22–32)
Calcium: 7.8 mg/dL — ABNORMAL LOW (ref 8.9–10.3)
Chloride: 112 mmol/L — ABNORMAL HIGH (ref 98–111)
Creatinine, Ser: 2.3 mg/dL — ABNORMAL HIGH (ref 0.61–1.24)
GFR, Estimated: 29 mL/min — ABNORMAL LOW (ref >60)
Glucose, Bld: 129 mg/dL — ABNORMAL HIGH (ref 70–99)
Potassium: 4.9 mmol/L (ref 3.5–5.1)
Sodium: 140 mmol/L (ref 135–145)
Total Bilirubin: 1 mg/dL (ref <1.2)
Total Protein: 4.4 g/dL — ABNORMAL LOW (ref 6.5–8.1)

## 2023-06-16 LAB — FOLATE: Folate: 5.4 ng/mL — ABNORMAL LOW (ref >5.9)

## 2023-06-16 LAB — CBC
HCT: 32.5 % — ABNORMAL LOW (ref 39.0–52.0)
Hemoglobin: 10 g/dL — ABNORMAL LOW (ref 13.0–17.0)
MCH: 29 pg (ref 26.0–34.0)
MCHC: 30.8 g/dL (ref 30.0–36.0)
MCV: 94.2 fL (ref 80.0–100.0)
Platelets: 168 10*3/uL (ref 150–400)
RBC: 3.45 MIL/uL — ABNORMAL LOW (ref 4.22–5.81)
RDW: 17.2 % — ABNORMAL HIGH (ref 11.5–15.5)
WBC: 17.4 10*3/uL — ABNORMAL HIGH (ref 4.0–10.5)
nRBC: 0 % (ref 0.0–0.2)

## 2023-06-16 LAB — LIPASE, BLOOD: Lipase: 351 U/L — ABNORMAL HIGH (ref 11–51)

## 2023-06-16 LAB — AMYLASE: Amylase: 223 U/L — ABNORMAL HIGH (ref 28–100)

## 2023-06-16 LAB — SARS CORONAVIRUS 2 BY RT PCR: SARS Coronavirus 2 by RT PCR: NEGATIVE

## 2023-06-16 LAB — LACTIC ACID, PLASMA: Lactic Acid, Venous: 1.6 mmol/L (ref 0.5–1.9)

## 2023-06-16 LAB — PHOSPHORUS
Phosphorus: 4.8 mg/dL — ABNORMAL HIGH (ref 2.5–4.6)
Phosphorus: 4.4 mg/dL (ref 2.5–4.6)

## 2023-06-16 LAB — MRSA NEXT GEN BY PCR, NASAL: MRSA by PCR Next Gen: NOT DETECTED

## 2023-06-16 LAB — BRAIN NATRIURETIC PEPTIDE: B Natriuretic Peptide: 717.4 pg/mL — ABNORMAL HIGH (ref 0.0–100.0)

## 2023-06-16 LAB — MAGNESIUM
Magnesium: 1.8 mg/dL (ref 1.7–2.4)
Magnesium: 1.7 mg/dL (ref 1.7–2.4)

## 2023-06-16 LAB — CK: Total CK: 157 U/L (ref 49–397)

## 2023-06-16 MED ORDER — PANTOPRAZOLE SODIUM 40 MG IV SOLR
40.0000 mg | INTRAVENOUS | Status: DC
  Administered 2023-06-16 – 2023-06-24 (×9): 40 mg via INTRAVENOUS
  Filled 2023-06-16 (×9): qty 10

## 2023-06-16 MED ORDER — FENTANYL 2500MCG IN NS 250ML (10MCG/ML) PREMIX INFUSION
0.0000 ug/h | INTRAVENOUS | Status: DC
  Administered 2023-06-16: 25 ug/h via INTRAVENOUS
  Filled 2023-06-16 (×2): qty 250

## 2023-06-16 MED ORDER — VITAL AF 1.2 CAL PO LIQD
1000.0000 mL | ORAL | Status: DC
  Administered 2023-06-16 – 2023-06-24 (×9): 1000 mL

## 2023-06-16 MED ORDER — PROPOFOL 1000 MG/100ML IV EMUL
5.0000 ug/kg/min | INTRAVENOUS | Status: DC
  Administered 2023-06-16: 20 ug/kg/min via INTRAVENOUS
  Administered 2023-06-16: 30 ug/kg/min via INTRAVENOUS
  Administered 2023-06-17: 40 ug/kg/min via INTRAVENOUS
  Administered 2023-06-17: 30 ug/kg/min via INTRAVENOUS
  Administered 2023-06-18: 20 ug/kg/min via INTRAVENOUS
  Administered 2023-06-18: 5 ug/kg/min via INTRAVENOUS
  Administered 2023-06-19: 30 ug/kg/min via INTRAVENOUS
  Administered 2023-06-19: 35 ug/kg/min via INTRAVENOUS
  Filled 2023-06-16 (×8): qty 100

## 2023-06-16 MED ORDER — FENTANYL BOLUS VIA INFUSION: 50.0000 ug | INTRAVENOUS | Status: DC | PRN

## 2023-06-16 MED ORDER — SODIUM BICARBONATE 8.4 % IV SOLN
INTRAVENOUS | Status: DC
  Filled 2023-06-16: qty 1000

## 2023-06-16 MED ORDER — ORAL CARE MOUTH RINSE
15.0000 mL | OROMUCOSAL | Status: DC | PRN
Start: 2023-06-16 — End: 2023-06-24

## 2023-06-16 MED ORDER — LEVETIRACETAM IN NACL 500 MG/100ML IV SOLN
500.0000 mg | Freq: Two times a day (BID) | INTRAVENOUS | Status: DC
  Administered 2023-06-16 – 2023-06-24 (×18): 500 mg via INTRAVENOUS
  Filled 2023-06-16 (×18): qty 100

## 2023-06-16 MED ORDER — VASOPRESSIN 20 UNITS/100 ML INFUSION FOR SHOCK
0.0000 [IU]/min | INTRAVENOUS | Status: DC
Start: 2023-06-16 — End: 2023-06-17
  Administered 2023-06-16 – 2023-06-17 (×2): 0.03 [IU]/min via INTRAVENOUS
  Filled 2023-06-16 (×2): qty 100

## 2023-06-16 MED ORDER — PIPERACILLIN-TAZOBACTAM 3.375 G IVPB
3.3750 g | Freq: Three times a day (TID) | INTRAVENOUS | Status: AC
  Administered 2023-06-16 – 2023-06-23 (×24): 3.375 g via INTRAVENOUS
  Filled 2023-06-16 (×24): qty 50

## 2023-06-16 MED ORDER — FOLIC ACID 1 MG PO TABS
1.0000 mg | ORAL_TABLET | Freq: Every day | ORAL | Status: DC
  Administered 2023-06-17 – 2023-06-24 (×7): 1 mg
  Filled 2023-06-16 (×7): qty 1

## 2023-06-16 MED ORDER — FENTANYL CITRATE PF 50 MCG/ML IJ SOSY
25.0000 ug | PREFILLED_SYRINGE | INTRAMUSCULAR | Status: DC | PRN
  Administered 2023-06-17 (×2): 100 ug via INTRAVENOUS
  Administered 2023-06-17: 75 ug via INTRAVENOUS
  Administered 2023-06-17 (×2): 100 ug via INTRAVENOUS
  Administered 2023-06-17: 50 ug via INTRAVENOUS
  Administered 2023-06-17: 25 ug via INTRAVENOUS
  Administered 2023-06-17: 50 ug via INTRAVENOUS
  Administered 2023-06-18 (×4): 100 ug via INTRAVENOUS
  Administered 2023-06-19 – 2023-06-24 (×2): 50 ug via INTRAVENOUS
  Filled 2023-06-16 (×2): qty 1
  Filled 2023-06-16 (×3): qty 2
  Filled 2023-06-16: qty 1
  Filled 2023-06-16 (×3): qty 2
  Filled 2023-06-16: qty 1
  Filled 2023-06-16 (×3): qty 2
  Filled 2023-06-16: qty 1

## 2023-06-16 MED ORDER — THIAMINE HCL 100 MG PO TABS
100.0000 mg | ORAL_TABLET | Freq: Every day | ORAL | Status: DC
  Administered 2023-06-17 – 2023-06-24 (×7): 100 mg
  Filled 2023-06-16 (×15): qty 1

## 2023-06-16 MED ORDER — ORAL CARE MOUTH RINSE
15.0000 mL | OROMUCOSAL | Status: DC
  Administered 2023-06-16 – 2023-06-24 (×102): 15 mL via OROMUCOSAL

## 2023-06-16 MED ORDER — PROSOURCE TF20 ENFIT COMPATIBL EN LIQD
60.0000 mL | Freq: Every day | ENTERAL | Status: DC
  Administered 2023-06-17 – 2023-06-24 (×7): 60 mL

## 2023-06-16 MED ORDER — PROPOFOL 1000 MG/100ML IV EMUL
INTRAVENOUS | Status: AC
  Administered 2023-06-16: 20 ug/kg/min via INTRAVENOUS
  Filled 2023-06-16: qty 100

## 2023-06-16 MED ORDER — TRIAMCINOLONE ACETONIDE 0.1 % EX CREA
TOPICAL_CREAM | Freq: Two times a day (BID) | CUTANEOUS | Status: DC
  Administered 2023-06-16 – 2023-06-24 (×2): 1 via TOPICAL
  Filled 2023-06-16 (×3): qty 15

## 2023-06-16 MED ORDER — NOREPINEPHRINE 16 MG/250ML-% IV SOLN
0.0000 ug/min | INTRAVENOUS | Status: DC
  Administered 2023-06-16: 10 ug/min via INTRAVENOUS
  Administered 2023-06-16: 16 ug/min via INTRAVENOUS
  Administered 2023-06-17: 32 ug/min via INTRAVENOUS
  Administered 2023-06-18: 26 ug/min via INTRAVENOUS
  Administered 2023-06-18: 15 ug/min via INTRAVENOUS
  Administered 2023-06-19: 16 ug/min via INTRAVENOUS
  Administered 2023-06-20: 6 ug/min via INTRAVENOUS
  Filled 2023-06-16 (×8): qty 250

## 2023-06-16 MED ORDER — IOHEXOL 350 MG/ML SOLN
75.0000 mL | Freq: Once | INTRAVENOUS | Status: AC | PRN
  Administered 2023-06-16: 75 mL via INTRAVENOUS

## 2023-06-16 MED ORDER — CLOPIDOGREL BISULFATE 75 MG PO TABS
75.0000 mg | ORAL_TABLET | Freq: Every day | ORAL | Status: DC
  Administered 2023-06-16 – 2023-06-24 (×8): 75 mg
  Filled 2023-06-16 (×8): qty 1

## 2023-06-16 MED ORDER — LACTATED RINGERS IV SOLN: INTRAVENOUS | Status: DC

## 2023-06-16 MED ORDER — MAGNESIUM SULFATE 2 GM/50ML IV SOLN
2.0000 g | Freq: Once | INTRAVENOUS | Status: AC
  Administered 2023-06-16: 2 g via INTRAVENOUS
  Filled 2023-06-16: qty 50

## 2023-06-16 MED ORDER — LACTATED RINGERS IV SOLN: INTRAVENOUS | Status: AC

## 2023-06-16 MED ORDER — FREE WATER
30.0000 mL | Status: DC
  Administered 2023-06-16 – 2023-06-23 (×35): 30 mL
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
## 2023-06-16 NOTE — Progress Notes (Signed)
Transported to MRI and back with no events.

## 2023-06-16 NOTE — Plan of Care (Signed)

## 2023-06-16 NOTE — Telephone Encounter (Signed)
Notification received that Mr. Allbaugh was admitted yesterday.  Notes reviewed.  Currently under the care of neurology, pulmonary critical care.  Possible delirium in the setting of rash related to phenytoin with possible exacerbation secondary to pancreatitis.  Imaging thought to be reassuring again structural causes, and expected gradual improvement with supportive care including holding phenytoin.  Plan for EEG to rule out nonconvulsive status epilepticus.  Called daughter [name] to let her know I did receive this information and to check to see how the family is doing.  Doing okay at this time.  They are aware of plan, hopeful for improvement with continued supportive care, including monitoring renal function.  Thanked me for my phone call, no further questions at this time.

## 2023-06-16 NOTE — Progress Notes (Signed)
Pharmacy Antibiotic Note  Gilbert [name] is a 72 y.o. male admitted on 06/15/2023 with possible aspiration pneumonia or intra-abdominal infection.  Pharmacy has been consulted for Zosyn dosing.  Plan: Zosyn 3.375g IV q8h (4 hour infusion).  Pharmacy will continue to follow and will adjust abx dosing whenever warranted.  Temp (24hrs), Avg:94.7 F (34.8 C), Min:91.1 F (32.8 C), Max:97.7 F (36.5 C)   Recent Labs  Lab 06/15/23 1802 06/15/23 1806 06/15/23 2029  WBC 15.5*  --   --   CREATININE  --  2.15*  --   LATICACIDVEN 1.5  --  0.8    Estimated Creatinine Clearance: 29 mL/min (A) (by C-G formula based on SCr of 2.15 mg/dL (H)).    Allergies  Allergen Reactions   Oxycodone Swelling and Other (See Comments)    Tongue and lips swell    Antimicrobials this admission: 11/13 Cefepime >> x 1 dose 11/13 Flagyl >> x 1 dose 11/13 Vancomycin >> x 1 dose 11/14 Zosyn >>  Microbiology results: 11/13  BCx: Pending 11/13 UCx: Pending   Thank you for allowing pharmacy to be a part of this patient's care.  Otelia Sergeant, PharmD, MBA 06/16/2023 2:08 AM

## 2023-06-16 NOTE — Consult Note (Signed)
[facility] Consult Services  Stat Consult  Patient Name:   Gilbert [name], Gilbert [name] Date of Birth:   11/03/1950 Identification Number:   MRN - [id number] Date of Service:   06/16/2023 00:59:52  Diagnosis:       R41.0 - Disorientation, unspecified  Impression This is a 72 year old man with history of suspected meningioma (noted on imaging 2017), seizures (reportedly since childhood), B cell lymphoma (diagnosed 2017, treated with chemotherapy), renal cell carcinoma, anemia, chronic kidney disease, here with a number of symptoms.  The patient has history of seizure and has been maintained on Dilantin although he has suffered a rash which is attributed to a switch to the generic form of the drug. We can change to renally dosed levetiracetam (500 mg BID- can be IV or PO). I should note that phenytoin level is elevated (23) but- upper limit of normal is 20.  He has had visual hallucinations - this is difficult to characterize- given that he is currently intubated and on sedation.  He has history of suspected meningioma- seen in 2017, possibly enlarged on his current CT. This would be best worked up with MRI brain with and without contrast. Given renal dysfunction currently- we can start with a non contrast scan.  The patient also has had bilateral leg numbness and increasing difficulty walking/ incoordination. Bilateral leg numbness and incoordination can occur in the setting of a myelopathy (cervical myelopathy can - in theory- cause upper extremity clumsiness). He Montoya benefit from imaging of the spinal cord- given renal function- can start with MRI without contrast.   Recommendations: Our recommendations are outlined below.  Laboratory Studies : MRI cervical/thoracic/lumbar spine without contrast to start, when feasible  MRI brain without contrast to start, when feasible  Ammonia  B12  folate  TSH  ammonia levels if not obtained  Stop phenytoin/ dilantin   Levetiracetam renally dosed 500 mg BID    ----------------------------------------------------------------------------------------------------    Metrics: Dispatch Time: 06/16/2023 00:59:52 Callback Response Time: 06/16/2023 01:01:31  Primary Provider Notified of Diagnostic Impression and Management Plan on: 06/16/2023 01:30:00     ----------------------------------------------------------------------------------------------------  Chief Complaint: AMS  History of Present Illness: Patient is a 72 year old Male. This is a 72 year old man with history of suspected meningioma (noted on imaging 2017), seizures (reportedly since childhood), B cell lymphoma (diagnosed 2017, treated with chemotherapy), renal cell carcinoma, anemia, chronic kidney disease, here with a number of symptoms. Of note- the patient has history of seizures, starting in childhood. He has been maintained on Dilantin for years. This was changed to generic a year ago. He has since developed a whole body itchy rash. Per NP at bedside- he saw dermatology, this was biopsied, and it was determined that this is most likely a drug reaction. The patient has also had intermittent slurred speech over the past month. He has also had bilateral leg numbness, increasing incoordination, and difficulty walking. Per NP at bedside- a month ago- the patient was functional and playing golf. In addition to the decline in mobility- he has had increasing confusion and visual hallucinations. He was brought to the hospital and has since been intubated.   Past Medical History:      Hypertension      Seizures Other PMH:  suspected meningioma (noted on imaging 2017), seizures (reportedly since childhood), B cell lymphoma (diagnosed 2017, treated with chemotherapy), renal cell carcinoma, anemia, chronic kidney disease, unable to obtain due to:   Patient Is Confused  Medications:  No Anticoagulant  use  No Antiplatelet use Reviewed EMR for  current medications  Allergies:   Allergies Unable To Obtain Due To: Patient Is Confused  Social History: Unable To Obtain Due To Patient Status : Patient Is Confused  Family History:  Family History Cannot Be Obtained Because:Patient Is Confused  ROS : ROS Cannot Be Obtained Because:  Patient Is Confused  Past Surgical History: Past Surgical History Cannot Be Obtained Because: Patient Is Confused There Is No Surgical History Contributory To Today's Visit   Examination: BP(74/63), Pulse(51),  Neuro Exam:  General: Limited- pt was intubated and on sedation, he was moving around spontaneously  Speech: limited by patient state  Language: limited by patient state  Face: no obvious facial droop but pt was intubated  Facial Sensation: limited by patient state  Visual Fields: limited by patient state  Extraocular Movements: limited by patient state  Motor Exam: moves all extremities  Sensation: limited by patient state  Coordination: limited by patient state  limited by patient state   Spoke with : [place] Rust Annia Friendly    This consult was conducted in real time using interactive audio and Immunologist. Patient was informed of the technology being used for this visit and agreed to proceed. Patient located in hospital and provider located at home/office setting.  Patient is being evaluated for possible acute neurologic impairment and high probability of imminent or life - threatening deterioration.I spent total of 35 minutes providing care to this patient, including time for face to face visit via telemedicine, review of medical records, imaging studies and discussion of findings with providers, the patient and / or family.   Dr Rosalita Chessman   [facility] For Inpatient follow-up with [facility] physician please call [facility] at [phone]. As we are not an outpatient service for any post hospital discharge needs please contact the hospital for  assistance.  If you have any questions for the [facility] physicians or need to reconsult for clinical or diagnostic changes please contact us via [facility] at [phone].

## 2023-06-16 NOTE — Progress Notes (Signed)
Initial Nutrition Assessment  DOCUMENTATION CODES:   Non-severe (moderate) malnutrition in context of chronic illness  INTERVENTION:   Vital 1.2@55ml /hr- Initiate at 22ml/hr and increase by 63ml/hr q 12 hrs until goal rate is reached.   ProSource TF 20- Give 60ml daily via tube, each supplement provides 80kcal and 20g of protein.   Free water flushes 30ml q4 hours to maintain tube patency   Regimen provides 1664kcal/day, 119g/day protein and 1277ml/day of free water.   Pt at high refeed risk; recommend monitor potassium, magnesium and phosphorus labs daily until stable  Daily weights   Thiamine 100mg  daily via tube  Folic acid 1mg  daily via tube   NUTRITION DIAGNOSIS:   Moderate Malnutrition related to chronic illness as evidenced by mild fat depletion, severe fat depletion, moderate muscle depletion, severe muscle depletion.  GOAL:   Provide needs based on ASPEN/SCCM guidelines  MONITOR:   Vent status, Labs, Weight trends, I & O's, Skin, TF tolerance  REASON FOR ASSESSMENT:   Consult Enteral/tube feeding initiation and management  ASSESSMENT:   72 y/o male with h/o seizures, HTN, HLD, PAD, CAD, CHF, B cell lymphoma (diagnosed 2017, treated with chemotherapy), compression fracture, ICM, GERD, MI, CKD III, stage III renal carcinoma s/p R nephrectomy (2015) and chronic meningioma who is admitted with AKI, AMS, SIRS, possible pancreatitis and generalized rash suspected from Dilantin reaction.  Pt sedated and ventilated. OGT in place (gastric). Will plan to initiate tube feeds today. Pt with suspected pancreatitis. No distension noted on exam today. Pt is likely at refeed risk. Per chart, pt appears fairly weight stable at baseline.   Medications reviewed and include: plavix, colace, heparin, protonix, miralax, LRS @75ml /hr, levophed, zosyn, propofol   Labs reviewed: K 4.9 wnl, BUN 55(H), creat 2.30(H), P 4.4 wnl, Mg 1.7 wnl Amylase- 223(H), lipase 351(H) BNP-  717.4(H) Folate- 5.4(L) Wbc- 17.4(H), Hgb 10.0(L), Hct 32.5(L) Cbgs- 106, 102, 114 x 24 hrs   Patient is currently intubated on ventilator support MV: 7.9 L/min Temp (24hrs), Avg:95.7 F (35.4 C), Min:91.1 F (32.8 C), Max:98.8 F (37.1 C)  Propofol: 8.82 ml/hr- provides 232kcal/day   MAP- >43mmHg   UOP-   NUTRITION - FOCUSED PHYSICAL EXAM:  Flowsheet Row Most Recent Value  Orbital Region Mild depletion  Upper Arm Region Severe depletion  Thoracic and Lumbar Region Mild depletion  Buccal Region Mild depletion  Temple Region Mild depletion  Clavicle Bone Region Moderate depletion  Clavicle and Acromion Bone Region Moderate depletion  Scapular Bone Region No depletion  Dorsal Hand Unable to assess  Patellar Region Severe depletion  Anterior Thigh Region Severe depletion  Posterior Calf Region Severe depletion  Edema (RD Assessment) Mild  Hair Reviewed  Eyes Reviewed  Mouth Reviewed  Skin Reviewed  Nails Reviewed   Diet Order:   Diet Order             Diet NPO time specified  Diet effective now                  EDUCATION NEEDS:   No education needs have been identified at this time  Skin:  Skin Assessment: Reviewed RN Assessment (rash)  Last BM:  pta  Height:   Ht Readings from Last 1 Encounters:  06/06/23 5\' 7"  (1.702 m)    Weight:   Wt Readings from Last 1 Encounters:  06/16/23 73.5 kg    Ideal Body Weight:  67 kg  BMI:  Body mass index is 25.38 kg/m.  Estimated Nutritional Needs:  Kcal:  1618kcal/day  Protein:  110-125g/day  Fluid:  1.7-2.0L/day  Betsey Holiday MS, RD, LDN Please refer to [facility] for RD and/or RD on-call/weekend/after hours pager

## 2023-06-16 NOTE — Consult Note (Signed)
PHARMACY CONSULT NOTE - ELECTROLYTES  Pharmacy Consult for Electrolyte Monitoring and Replacement   Recent Labs: Weight: 73.5 kg (162 lb 0.6 oz) Estimated Creatinine Clearance: 27.1 mL/min (A) (by C-G formula based on SCr of 2.3 mg/dL (H)).  Potassium  Date Value  06/16/2023 4.9 mmol/L  04/20/2017 3.6 mEq/L   Magnesium (mg/dL)  Date Value  16/60/6301 1.7   Calcium (mg/dL)  Date Value  60/05/9322 7.8 (L)  04/20/2017 8.8   Albumin (g/dL)  Date Value  55/73/2202 2.1 (L)  04/20/2017 3.0 (L)   Phosphorus (mg/dL)  Date Value  54/27/0623 4.4   Sodium  Date Value  06/16/2023 140 mmol/L  12/10/2021 142 mmol/L  04/20/2017 140 mEq/L   Assessment  Jamere Gapinski is a 72 y.o. male presenting with possible pancreatitis and duodenal diverticulitis. 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 Pharmacy has been consulted to monitor and replace electrolytes.  Diet: NPO MIVF: Sodium bicarbonate @ 75 mL/hr   Goal of Therapy: Electrolytes WNL  Plan:  Mg 1.7; will order magnesium sulfate 2 gm IV x 1  Check BMP, Mg, Phos with AM labs  Thank you for allowing pharmacy to be a part of this patient's care.  Littie Deeds, PharmD Pharmacy Resident  06/16/2023 7:20 AM

## 2023-06-16 NOTE — Procedures (Signed)
Patient Name: Gilbert [name]  MRN: [id number]  Epilepsy Attending: Charlsie Quest  Referring Physician/Provider: Gordy Councilman, MD  Date: 06/16/2023 Duration: 32.04 mins  Patient history: 72yo M with ams getting eeg to evaluate for seizure  Level of alertness:  comatose  AEDs during EEG study: Propofol, LEV  Technical aspects: This EEG study was done with scalp electrodes positioned according to the 10-20 International system of electrode placement. Electrical activity was reviewed with band pass filter of 1-70Hz , sensitivity of 7 uV/mm, display speed of 27mm/sec with a 60Hz  notched filter applied as appropriate. EEG data were recorded continuously and digitally stored.  Video monitoring was available and reviewed as appropriate.  Description: EEG showed continuous generalized 3 to 6 Hz theta-delta slowing admixed with 13-15Hz  beta activity. Hyperventilation and photic stimulation were not performed.     ABNORMALITY - Continuous slow, generalized  IMPRESSION: This study is suggestive of moderate to severe diffuse encephalopathy. No seizures or epileptiform discharges were seen throughout the recording.  Gilbert [name]

## 2023-06-16 NOTE — Telephone Encounter (Signed)
Pt did go to the hospital yesterday and was admitted

## 2023-06-16 NOTE — Progress Notes (Signed)
eLink Physician-Brief Progress Note Patient Name: Gilbert [name] DOB: 03-03-1951 MRN: [id number]   Date of Service  06/16/2023  HPI/Events of Note  Patient admitted to the ICU with acute respiratory failure requiring intubation and mechanical ventilation, work up in progress.  eICU Interventions  New Patient Evaluation.        Leslie Langille U Wade Sigala 06/16/2023, 1:14 AM

## 2023-06-16 NOTE — ED Notes (Signed)
Patient transported to CT with RN and RT on full monitor and with  medication infusing. Pt to be transported to ICU after CT completion

## 2023-06-16 NOTE — Progress Notes (Signed)
Possible Mild Pancreatitis +/- Duodenal Diverticulitis CT angio chest PE & abdomen/pelvis w contrast 06/16/23: Results just released showing negative for acute pulmonary embolism, small bilateral pleural effusions with compressive atelectasis increased since 05/07/2023 with pneumonia difficult to exclude.  Bronchitis.  Wall thickening and adjacent stranding about the descending portion of the duodenum centered around duodenal diverticulum.  Stranding and fluid about the pancreas findings [place] be due to pancreatitis with reactive duodenitis or duodenal diverticulitis.  Unchanged indeterminant sclerosis in the 1011 vertebral body, right lateral sixth rib, and left glenoid.  Similar compression fracture of T11.  -Follow-up lipase and amylase, triglycerides -Continue Zosyn    Betsey Holiday, AGACNP-BC Acute Care Nurse Practitioner [facility]   [phone] / [phone] Please see Amion for details.

## 2023-06-16 NOTE — Telephone Encounter (Signed)
Noted. I do see the admission notes.

## 2023-06-16 NOTE — Progress Notes (Signed)
Eeg done 

## 2023-06-16 NOTE — Consult Note (Signed)
NEUROLOGY CONSULT NOTE   Date of service: June 16, 2023 Patient Name: Gilbert [name] MRN:  [id number] DOB:  1950-08-12 Chief Complaint: "Agitation and difficulty walking" Requesting Provider: Raechel Chute, MD  History of Present Illness  Gilbert [name] is a 71 y.o. 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  Per notes in the chart his Dilantin was changed from brand name to generic approximately 1 to 1.5 years ago.  Subsequent to this he developed a rash, maculopapular with significant itching.  He was admitted 10/3 through 10/10 for acute kidney injury found on routine blood work also noted to have had an intentional weight loss of about 30 pounds over 3 to 4 months.  Entresto and HCTZ were discontinued, Coreg was reduced and an autoimmune workup was performed due to his rash which showed a slightly elevated ANA.  During this hospitalization he was given hydroxyzine for the itching which resulted in hallucinations; he was also reporting back pain.  Hydroxyzine was subsequently discontinued.  At baseline he does have some shortness of breath with walking but was still golfing 2 months ago.  Progressively getting weaker to the point where he was still walking up until a week ago when his walking became "drunken" in appearance  He did follow-up with his oncologist on 11/4 who felt there was low concern for any recurrent malignancy but sent him to dermatology for biopsy of his rash.  Reportedly biopsy was consistent with phenytoin toxicity but he did not stop his phenytoin due to anxiety about stopping this medication  Subsequently he was paranoid when his family saw him on 11/13, thinking that people are trying to rob him and trying to get into his gun safe.  He  reportedly became apneic while in transit with EMS requiring bag valve.  Was briefly on BiPAP in the ED but subsequently intubated.  Noted to be hypothermic  He did receive a dose of cefepime in the ED per sepsis protocol, now on vancomycin, piperacillin/tazobactam and metronidazole.   Of note he did also have a MRSA positive finger infection on 10/31  Admission labs notable for slightly worsening renal function with creatinine of 2.3 (1.92 previously), BUN increased to 55 from recent baseline of 30, slightly supratherapeutic Dilantin level at 23, slightly low folate level at 5.4, TSH 2.14, ammonia 21, as well as elevated amylase at 223 and lipase at 351.  He is also requiring pressors  Video neurology services were engaged and recommended MRI pan neuraxis without contrast due to his renal function, which has been completed (personally reviewed), reassuring, as well as the lab work above and switched him from Dilantin to Keppra  ROS  Unable to ascertain due to mental status, intubation   Past History   Past Medical History:  Diagnosis Date   Abnormal liver enzymes    Chronic alkaline phosphatase elevation since 2014   Acid reflux    CAD (coronary artery disease)    Cancer (HCC) 08/2013   right kidney cancer   Cancer (HCC)    Coronary artery calcification seen on CAT scan    Coronary atherosclerosis of native coronary artery    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/07/2016   Diffuse non-Hodgkin's lymphoma of bone (HCC) 01/08/2016   Essential hypertension, benign    Family history of anesthesia complication    mother-had stroke  during anesthesia   GERD (gastroesophageal reflux disease)    Gout    History of cardiac arrest 11/26/2013   PTCA/Stenting of LM & Prox LAD with 4.0 x 18 mm Xience alpine stent. Used Impella Circulatory assist device. EF= 20-25%   History of epilepsy    at least 10 years ago. Grandmal seizures since childhood   History of myocardial infarction  less than 8 weeks    Stent placed   History of nephrectomy 11/2013   For renal cell carcinoma   History of tobacco use    HTN (hypertension)    Hypercholesteremia    Hyperlipemia    Hypertension    PAD (peripheral artery disease) (HCC)    Seizures (HCC)    LAST SZ 15-20 YEARS AGO 03-30-2021   Shortness of breath    with activity   SOB (shortness of breath)    Systolic and diastolic CHF, chronic (HCC)    Resolved. EF 45% 04/10/2014   Therapeutic drug monitoring     Past Surgical History:  Procedure Laterality Date   BALLOON DILATION N/A 01/21/2015   Procedure: BALLOON DILATION;  Surgeon: Iva Boop, MD;  Location: [facility] ENDOSCOPY;  Service: Endoscopy;  Laterality: N/A;   CARDIAC CATHETERIZATION N/A 06/10/2015   Procedure: Left Heart Cath and Coronary Angiography;  Surgeon: Yates Decamp, MD;  Location: [facility] INVASIVE CV LAB;  Service: Cardiovascular;  Laterality: N/A;   CORONARY ANGIOPLASTY WITH STENT PLACEMENT  08/02/2013   Left main coronary artery stenting on emergent basis along with circulatory support with Impella device through left leg. With 4.0 x 18 mm Xience alpine stent   EAR CYST EXCISION Left 05/31/2018   Procedure: EXCISION LEFT POSTERIOR EAR TUMOR;  Surgeon: Newman Pies, MD;  Location: [facility] OR;  Service: ENT;  Laterality: Left;   ESOPHAGOGASTRODUODENOSCOPY     ESOPHAGOGASTRODUODENOSCOPY N/A 01/21/2015   Procedure: ESOPHAGOGASTRODUODENOSCOPY (EGD) with possible dilation.;  Surgeon: Iva Boop, MD;  Location: [facility] ENDOSCOPY;  Service: Endoscopy;  Laterality: N/A;   EYE SURGERY Left 2 weeks ago   torn retina   ILIAC ARTERY STENT Left 05/28/2014   dr Jacinto Halim   LEFT HEART CATHETERIZATION WITH CORONARY ANGIOGRAM N/A 11/27/2013   Procedure: LEFT HEART CATHETERIZATION WITH CORONARY ANGIOGRAM;  Surgeon: Pamella Pert, MD;  Location: [facility] CATH LAB;  Service: Cardiovascular;  Laterality: N/A;   LOWER EXTREMITY ANGIOGRAM N/A 02/26/2014   Procedure: LOWER EXTREMITY ANGIOGRAM;  Surgeon:  Pamella Pert, MD;  Location: [facility] CATH LAB;  Service: Cardiovascular;  Laterality: N/A;   LOWER EXTREMITY ANGIOGRAM N/A 05/28/2014   Procedure: LOWER EXTREMITY ANGIOGRAM;  Surgeon: Pamella Pert, MD;  Location: [facility] CATH LAB;  Service: Cardiovascular;  Laterality: N/A;   NEPHRECTOMY  11/30/2013   PERCUTANEOUS CORONARY STENT INTERVENTION (PCI-S)  11/27/2013   Procedure: PERCUTANEOUS CORONARY STENT INTERVENTION (PCI-S);  Surgeon: Pamella Pert, MD;  Location: [facility] CATH LAB;  Service: Cardiovascular;;   RIGHT/LEFT HEART CATH AND CORONARY ANGIOGRAPHY N/A 03/23/2022   Procedure: RIGHT/LEFT HEART CATH AND CORONARY ANGIOGRAPHY;  Surgeon: Yates Decamp, MD;  Location: [facility] INVASIVE CV LAB;  Service: Cardiovascular;  Laterality: N/A;   ROBOT ASSISTED LAPAROSCOPIC NEPHRECTOMY Right 10/19/2013   Procedure: ROBOTIC ASSISTED LAPAROSCOPIC NEPHRECTOMY,  EXTENSIVE ADHESIOLYSIS;  Surgeon: Sebastian Ache, MD;  Location: [facility] ORS;  Service: Urology;  Laterality: Right;   SKIN FULL THICKNESS GRAFT Left 05/31/2018   Procedure: SKIN GRAFT FULL THICKNESS FROM LEFT ABDOMEN TO LEFT EAR;  Surgeon: Newman Pies, MD;  Location: [facility] OR;  Service: ENT;  Laterality: Left;    Family History: Family History  Problem Relation Age of Onset   Stroke Mother    Emphysema Father    Heart disease Brother        multiple stents   Hyperlipidemia Brother    Stomach cancer Maternal Grandmother    Colon cancer Neg Hx    Colon polyps Neg Hx    Esophageal cancer Neg Hx    Rectal cancer Neg Hx     Social History  reports that he quit smoking about 19 years ago. His smoking use included cigarettes. He started smoking about 64 years ago. He has a 90 pack-year smoking history. His smokeless tobacco use includes chew. He reports that he does not drink alcohol and does not use drugs.  Allergies  Allergen Reactions   Oxycodone Swelling and Other (See Comments)    Tongue and lips swell    Medications   Current Facility-Administered  Medications:    0.9 %  sodium chloride infusion, 250 mL, Intravenous, Continuous, Rust-Chester, Cecelia Byars, NP, Held at 06/16/23 0159   Chlorhexidine Gluconate Cloth 2 % PADS 6 each, 6 each, Topical, Daily, Dgayli, Lianne Bushy, MD, 6 each at 06/16/23 0925   clopidogrel (PLAVIX) tablet 75 mg, 75 mg, Per Tube, Daily, Rust-Chester, Cecelia Byars, NP, 75 mg at 06/16/23 0924   docusate (COLACE) 50 MG/5ML liquid 100 mg, 100 mg, Per Tube, BID, Rust-Chester, Micheline Rough L, NP, 100 mg at 06/16/23 8469   docusate sodium (COLACE) capsule 100 mg, 100 mg, Oral, BID PRN, Rust-Chester, Micheline Rough L, NP   fentaNYL (SUBLIMAZE) injection 25-100 mcg, 25-100 mcg, Intravenous, Q30 min PRN, Dgayli, Khabib, MD   heparin injection 5,000 Units, 5,000 Units, Subcutaneous, Q8H, Rust-Chester, Britton L, NP, 5,000 Units at 06/16/23 0230   lactated ringers infusion, , Intravenous, Continuous, Dgayli, Khabib, MD, Last Rate: 75 mL/hr at 06/16/23 1213, Infusion Verify at 06/16/23 1213   levETIRAcetam (KEPPRA) IVPB 500 mg/100 mL premix, 500 mg, Intravenous, Q12H, Rust-Chester, Cecelia Byars, NP, Stopped at 06/16/23 0953   midazolam (VERSED) injection 1-2 mg, 1-2 mg, Intravenous, Q1H PRN, Rust-Chester, Micheline Rough L, NP, 2 mg at 06/16/23 0040   norepinephrine (LEVOPHED) 16 mg in (0.064 mg/mL) premix infusion, 0-40 mcg/min, Intravenous, Titrated, Rust-Chester, Britton L, NP, Last Rate: 12.19 mL/hr at 06/16/23 1213, 13 mcg/min at 06/16/23 1213   Oral care mouth rinse, 15 mL, Mouth Rinse, Q2H, Rust-Chester, Britton L, NP, 15 mL at 06/16/23 1131   Oral care mouth rinse, 15 mL, Mouth Rinse, PRN, Rust-Chester, Micheline Rough L, NP   pantoprazole (PROTONIX) injection 40 mg, 40 mg, Intravenous, Q24H, Dgayli, Khabib, MD, 40 mg at 06/16/23 0923   piperacillin-tazobactam (ZOSYN) IVPB 3.375 g, 3.375 g, Intravenous, Q8H, Otelia Sergeant, RPH, Stopped at 06/16/23 6295   polyethylene glycol (MIRALAX / GLYCOLAX) packet 17 g, 17 g, Oral, Daily PRN, Rust-Chester, Britton L,  NP   polyethylene glycol (MIRALAX / GLYCOLAX) packet 17 g, 17 g, Per Tube, Daily, Rust-Chester, Britton L, NP, 17 g at 06/16/23 0923   propofol (DIPRIVAN) 1000 MG/100ML infusion, 5-50 mcg/kg/min, Intravenous, Titrated, Rust-Chester, Britton L, NP, Last Rate: 8.82 mL/hr at 06/16/23 1213, 20 mcg/kg/min at 06/16/23 1213   sodium chloride flush (NS) 0.9 % injection 10 mL, 10 mL, Intravenous, Q12H, Mumma, Shannon, MD, 10 mL at 06/16/23 0942   triamcinolone cream (KENALOG) 0.1 % cream, , Topical, BID, Rust-Chester, Cecelia Byars, NP, 1 Application at 06/16/23 0927  Vitals   Vitals:   06/16/23 1130 06/16/23 1145 06/16/23 1200 06/16/23 1215  BP: (!) 109/50 (!) 113/50 (!) 93/49 (!) 91/47  Pulse: 66 68 69 70  Resp: 16 16 15 19   Temp: 98.4 F (36.9 C) 98.6 F (37 C) 98.6 F (37 C) 98.8 F (37.1 C)  TempSrc:   Esophageal   SpO2: 100% 100% 100% 100%  Weight:        Body mass index is 25.38 kg/m.  Physical Exam   Constitutional: Appears chronically ill Psych: Minimally interactive Eyes: No scleral injection.  HENT: ET tube in place Head: Normocephalic.  Cardiovascular: Normal rate and regular rhythm.  Respiratory: Comfortable on the ventilator Skin: Diffuse rash with some excoriations in various stages of healing  Neurologic Examination   Mental status/motor/sensory With propofol paused, slightly reactive to light stimulation in all 4 extremities, with no gross asymmetry noted.  Withdraws in the bilateral upper extremities, wiggles bilateral toes spontaneously/to light touch.  Does not open eyes to voice or command.  Does not follow commands in any of his extremities.    CN: Gaze disconjugate.  Does not orient to examiner.  Pupils equal round reactive to light.  VOR partially suppressed.  No clear response to voice.  Symmetrically furrows brow to light noxious stimulation.  Intact cough and gag  Reflexes are 2+ and symmetric in the brachioradialis, biceps and patellae.    Labs/Imaging/Neurodiagnostic studies   CBC:  Recent Labs  Lab 06/19/2023 1802 06/16/23 0240  WBC 15.5* 17.4*  NEUTROABS 10.7*  --   HGB 11.0* 10.0*  HCT 36.9* 32.5*  MCV 96.6 94.2  PLT 202 168    Basic Metabolic Panel:  Lab Results  Component Value Date   NA 140 06/16/2023   K 4.9 06/16/2023   CO2 19 (L) 06/16/2023   GLUCOSE 129 (H) 06/16/2023   BUN 55 (H) 06/16/2023   CREATININE 2.30 (H) 06/16/2023   CALCIUM 7.8 (L) 06/16/2023   GFRNONAA 29 (L) 06/16/2023   GFRAA >60 07/11/2019    Lipid Panel:  Lab Results  Component Value Date   LDLCALC 63 05/05/2023    HgbA1c:  Lab Results  Component Value Date   HGBA1C 5.1 08/13/2014    Urine Drug Screen:     Component Value Date/Time   LABOPIA NONE DETECTED 06/16/2023 1917   COCAINSCRNUR NONE DETECTED 06/16/2023 1917   LABBENZ NONE DETECTED 06/08/2023 1917   AMPHETMU NONE DETECTED 06/07/2023 1917   THCU NONE DETECTED 24-Jun-2023 1917   LABBARB NONE DETECTED June 24, 2023 1917     Alcohol Level No results found for: "ETH"  INR  Lab Results  Component Value Date   INR 1.4 (H) 06/09/2023    APTT  Lab Results  Component Value Date   APTT 42 (H) 06/06/2023    AED levels:  Lab Results  Component Value Date   PHENYTOIN 23.5 (H) 06/30/2023     MRI brain: 1. No acute finding. 2. Long-standing 2 cm anterior left parafalcine meningioma with limited growth since 2017.  MRI C-spine, T-spine, L-spine Cervical spine:   1. Normal appearance of the cord. 2. Mild for age degeneration. Degenerative foraminal stenosis on the right more than left at C5-6. Widely patent spinal canal.   Thoracic spine:   1. Normal appearance of the cord. 2. Chronic lesion with superior endplate fracture at T11, present since at least the 2017 PET CT. No aggressive bone lesion or fracture.   Lumbar spine:   Degeneration without acute finding or neural impingement.  CTA angio chest PE protocol and abdomen pelvis with contrast:   1.  Negative for acute pulmonary embolism. 2. Small bilateral pleural effusions and compressive atelectasis, increased from 05/07/2023. Pneumonia is difficult to exclude. 3. Bronchitis. 4. Enteric tube is curled in the distal esophagus with tip oriented superiorly in the distal esophagus. Recommend repositioning. 5. Wall thickening and adjacent stranding about the descending portion of the duodenum centered about a duodenal diverticulum. Stranding and fluid about the pancreas. Findings Berroa be due to pancreatitis with reactive duodenitis or duodenal diverticulitis. 6. Unchanged indeterminate sclerosis in the T11 vertebral body, right lateral sixth rib, and left glenoid. Similar compression fracture of T11.   ASSESSMENT   Possibly delirium/decompensation in the setting of rash related to phenytoin, with exacerbation secondary to pancreatitis.  Phenytoin can also be associated with pancreatitis (though this is rare).  Phenytoin level was high and given his altered mental status unclear if he Klahr have taken extra doses or missed some doses (missed recent doses would suggest that some of his ataxia Bebo have been due to supratherapeutic phenytoin levels)  Fortunately imaging is reassuring against structural causes.  Expect gradual improvement with supportive care including holding phenytoin.  However will rule out nonconvulsive status epilepticus with routine EEG and continue to follow along  Additionally his exam is not particularly myelopathic, nor does he have any clear focal deficits though of course examination is limited in the setting of his encephalopathy  RECOMMENDATIONS   -Agree with Keppra 500 every 12 hours which is appropriate for his current renal function: Estimated Creatinine Clearance: 27.1 mL/min (A) (by C-G formula based on SCr of 2.3 mg/dL (H)).   CrCl 15 to <30 mL/minute/1.73 m2: 250 to 500 mg every 12 hours. -Routine EEG to rule out nonconvulsive status  epilepticus -Appreciate confirmation of biopsy results supporting phenytoin induced drug rash -Discussed with CCM team at bedside -Neurology will follow along ______________________________________________________________________    Nunzio Cory MD-PhD [facility] [phone] [facility] coverage for [facility] is from 8 AM to 4 AM in-house and 4 PM to 8 PM by telephone/video. 8 PM to 8 AM emergent questions or overnight urgent questions should be addressed to Teleneurology On-call or [facility] neurohospitalist; contact information can be found on AMION  CRITICAL CARE Performed by: Gordy Councilman   Total critical care time: 40 minutes  Critical care time was exclusive of separately billable procedures and treating other patients.  Critical care was necessary to treat or prevent imminent or life-threatening deterioration.  Critical care was time spent personally by me on the following activities: development of treatment plan with patient and/or surrogate as well as nursing, discussions with consultants, evaluation of patient's response to treatment, examination of patient, obtaining history from patient or surrogate, ordering and performing treatments and interventions, ordering and review of laboratory studies, ordering and review of radiographic studies, pulse oximetry and re-evaluation of patient's condition.

## 2023-06-17 ENCOUNTER — Other Ambulatory Visit: Payer: Medicare Other

## 2023-06-17 ENCOUNTER — Other Ambulatory Visit: Payer: Self-pay

## 2023-06-17 DIAGNOSIS — J9602 Acute respiratory failure with hypercapnia: Secondary | ICD-10-CM | POA: Diagnosis not present

## 2023-06-17 DIAGNOSIS — T420X1A Poisoning by hydantoin derivatives, accidental (unintentional), initial encounter: Secondary | ICD-10-CM | POA: Diagnosis not present

## 2023-06-17 DIAGNOSIS — G928 Other toxic encephalopathy: Secondary | ICD-10-CM | POA: Diagnosis not present

## 2023-06-17 LAB — HEPATIC FUNCTION PANEL
ALT: 40 U/L (ref 0–44)
AST: 64 U/L — ABNORMAL HIGH (ref 15–41)
Albumin: 1.7 g/dL — ABNORMAL LOW (ref 3.5–5.0)
Alkaline Phosphatase: 244 U/L — ABNORMAL HIGH (ref 38–126)
Bilirubin, Direct: 0.5 mg/dL — ABNORMAL HIGH (ref 0.0–0.2)
Indirect Bilirubin: 0.6 mg/dL (ref 0.3–0.9)
Total Bilirubin: 1.1 mg/dL (ref <1.2)
Total Protein: 3.9 g/dL — ABNORMAL LOW (ref 6.5–8.1)

## 2023-06-17 LAB — PHOSPHORUS: Phosphorus: 5 mg/dL — ABNORMAL HIGH (ref 2.5–4.6)

## 2023-06-17 LAB — BASIC METABOLIC PANEL
Anion gap: 10 (ref 5–15)
BUN: 57 mg/dL — ABNORMAL HIGH (ref 8–23)
CO2: 18 mmol/L — ABNORMAL LOW (ref 22–32)
Calcium: 7.5 mg/dL — ABNORMAL LOW (ref 8.9–10.3)
Chloride: 113 mmol/L — ABNORMAL HIGH (ref 98–111)
Creatinine, Ser: 2.64 mg/dL — ABNORMAL HIGH (ref 0.61–1.24)
GFR, Estimated: 25 mL/min — ABNORMAL LOW (ref >60)
Glucose, Bld: 203 mg/dL — ABNORMAL HIGH (ref 70–99)
Potassium: 4.9 mmol/L (ref 3.5–5.1)
Sodium: 141 mmol/L (ref 135–145)

## 2023-06-17 LAB — PROCALCITONIN: Procalcitonin: 2.7 ng/mL

## 2023-06-17 LAB — URINE CULTURE: Culture: NO GROWTH

## 2023-06-17 LAB — GLUCOSE, CAPILLARY
Glucose-Capillary: 136 mg/dL — ABNORMAL HIGH (ref 70–99)
Glucose-Capillary: 138 mg/dL — ABNORMAL HIGH (ref 70–99)
Glucose-Capillary: 155 mg/dL — ABNORMAL HIGH (ref 70–99)
Glucose-Capillary: 162 mg/dL — ABNORMAL HIGH (ref 70–99)
Glucose-Capillary: 200 mg/dL — ABNORMAL HIGH (ref 70–99)
Glucose-Capillary: 67 mg/dL — ABNORMAL LOW (ref 70–99)
Glucose-Capillary: 78 mg/dL (ref 70–99)
Glucose-Capillary: 97 mg/dL (ref 70–99)

## 2023-06-17 LAB — CBC WITH DIFFERENTIAL/PLATELET
Abs Immature Granulocytes: 0.2 10*3/uL — ABNORMAL HIGH (ref 0.00–0.07)
Basophils Absolute: 0.1 10*3/uL (ref 0.0–0.1)
Basophils Relative: 0 %
Eosinophils Absolute: 0.2 10*3/uL (ref 0.0–0.5)
Eosinophils Relative: 1 %
HCT: 30.2 % — ABNORMAL LOW (ref 39.0–52.0)
Hemoglobin: 9.6 g/dL — ABNORMAL LOW (ref 13.0–17.0)
Immature Granulocytes: 1 %
Lymphocytes Relative: 9 %
Lymphs Abs: 2 10*3/uL (ref 0.7–4.0)
MCH: 29 pg (ref 26.0–34.0)
MCHC: 31.8 g/dL (ref 30.0–36.0)
MCV: 91.2 fL (ref 80.0–100.0)
Monocytes Absolute: 2.2 10*3/uL — ABNORMAL HIGH (ref 0.1–1.0)
Monocytes Relative: 10 %
Neutro Abs: 17.1 10*3/uL — ABNORMAL HIGH (ref 1.7–7.7)
Neutrophils Relative %: 79 %
Platelets: 140 10*3/uL — ABNORMAL LOW (ref 150–400)
RBC: 3.31 MIL/uL — ABNORMAL LOW (ref 4.22–5.81)
RDW: 17.5 % — ABNORMAL HIGH (ref 11.5–15.5)
WBC: 21.7 10*3/uL — ABNORMAL HIGH (ref 4.0–10.5)
nRBC: 0 % (ref 0.0–0.2)

## 2023-06-17 LAB — LIPASE, BLOOD: Lipase: 236 U/L — ABNORMAL HIGH (ref 11–51)

## 2023-06-17 LAB — AMYLASE: Amylase: 190 U/L — ABNORMAL HIGH (ref 28–100)

## 2023-06-17 LAB — PHENYTOIN LEVEL, TOTAL: Phenytoin Lvl: 17.6 ug/mL (ref 10.0–20.0)

## 2023-06-17 LAB — PHENYTOIN LEVEL, FREE AND TOTAL
Phenytoin, Free: 5 ug/mL — ABNORMAL HIGH (ref 1.0–2.0)
Phenytoin, Total: 21 ug/mL — ABNORMAL HIGH (ref 10.0–20.0)

## 2023-06-17 LAB — MAGNESIUM: Magnesium: 2.2 mg/dL (ref 1.7–2.4)

## 2023-06-17 MED ORDER — CHLORHEXIDINE GLUCONATE CLOTH 2 % EX PADS
6.0000 | MEDICATED_PAD | Freq: Every day | CUTANEOUS | Status: DC
  Administered 2023-06-17 – 2023-06-24 (×8): 6 via TOPICAL

## 2023-06-17 MED ORDER — DEXMEDETOMIDINE HCL IN NACL 400 MCG/100ML IV SOLN
0.0000 ug/kg/h | INTRAVENOUS | Status: DC
  Administered 2023-06-17: 0.4 ug/kg/h via INTRAVENOUS
  Administered 2023-06-17: 1 ug/kg/h via INTRAVENOUS
  Administered 2023-06-17: 0.9 ug/kg/h via INTRAVENOUS
  Administered 2023-06-18 (×2): 1 ug/kg/h via INTRAVENOUS
  Filled 2023-06-17 (×5): qty 100

## 2023-06-17 NOTE — Progress Notes (Signed)
NEUROLOGY CONSULT FOLLOW UP NOTE   Date of service: June 17, 2023 Patient Name: Gilbert [name] MRN:  [id number] DOB:  Sep 19, 1950  Brief HPI  Gilbert [name] is a 72 y.o. 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  Physical Exam   Constitutional: Appears chronically ill Psych: Minimally interactive Eyes: No scleral injection.  HENT: ET tube in place Head: Normocephalic.  Cardiovascular: Normal rate and regular rhythm.  Respiratory: Comfortable on the ventilator Skin: Diffuse rash with some excoriations in various stages of healing, grossly stable  Neurologic Examination   Mental status/motor/sensory With propofol paused, slightly reactive to light stimulation in all 4 extremities, with no gross asymmetry noted.  Nonpurposeful spontaneous antigravity movements in the bilateral upper extremities, spontaneous ~2/5 bilateral lower extremity movements today.  Does not open eyes to voice or command.  Does not follow commands in any of his extremities.  Overall more briskly reactive than 11/14   CN: Gaze nearly conjugate today.  Does not orient to examiner.  Pupils equal round reactive to light.  VOR partially suppressed but improved from yesterday.  No clear response to voice.  Symmetrically furrows brow to light  noxious stimulation.  Intact cough and gag   Reflexes are 3+ and symmetric in the brachioradialis, biceps and patellae today.   Labs and Diagnostic Imaging    Basic Metabolic Panel: Recent Labs  Lab 06/15/23 1806 06/15/23 2028 06/16/23 0240 06/17/23 0337  NA 139  --  140 141  K 5.3*  --  4.9 4.9  CL 107  --  112* 113*  CO2 23  --  19* 18*  GLUCOSE 130*  --  129* 203*  BUN 52*  --  55* 57*  CREATININE 2.15*  --  2.30* 2.64*  CALCIUM 8.2*  --  7.8* 7.5*  MG  --  1.8 1.7 2.2  PHOS  --  4.8* 4.4 5.0*    CBC: Recent Labs  Lab 06/15/23 1802 06/16/23 0240 06/17/23 0337  WBC 15.5* 17.4* 21.7*  NEUTROABS 10.7*  --  17.1*  HGB 11.0* 10.0* 9.6*  HCT 36.9* 32.5* 30.2*  MCV 96.6 94.2 91.2  PLT 202 168 140*    Coagulation Studies: Recent Labs    06/15/23 1806  LABPROT 17.5*  INR 1.4*      Lipid Panel:  Lab Results  Component Value Date   LDLCALC 63 05/05/2023   HgbA1c:  Lab Results  Component Value Date   HGBA1C 5.1 08/13/2014   Urine Drug Screen:     Component Value Date/Time   LABOPIA NONE DETECTED 06/15/2023 1917   COCAINSCRNUR NONE DETECTED 06/15/2023 1917   LABBENZ NONE DETECTED 06/15/2023 1917   AMPHETMU NONE DETECTED 06/15/2023 1917   THCU NONE DETECTED 06/15/2023 1917   LABBARB  NONE DETECTED 06/15/2023 1917    Alcohol Level No results found for: "ETH" INR  Lab Results  Component Value Date   INR 1.4 (H) 06/15/2023   APTT  Lab Results  Component Value Date   APTT 42 (H) 06/15/2023   AED levels:  Lab Results  Component Value Date   PHENYTOIN 23.5 (H) 06/15/2023   Corrected for albumin of 1.7 estimated to be 41.4 micrograms/mL   MRI brain 11/14: 1. No acute finding. 2. Long-standing 2 cm anterior left parafalcine meningioma with limited growth since 2017.   MRI C-spine, T-spine, L-spine 11/14 Cervical spine: 1. Normal appearance of the cord. 2. Mild for age degeneration. Degenerative foraminal stenosis on the right more than left  at C5-6. Widely patent spinal canal.   Thoracic spine: 1. Normal appearance of the cord. 2. Chronic lesion with superior endplate fracture at T11, present since at least the 2017 PET CT. No aggressive bone lesion or fracture.   Lumbar spine: Degeneration without acute finding or neural impingement.   CTA angio chest PE protocol and abdomen pelvis with contrast:  1. Negative for acute pulmonary embolism. 2. Small bilateral pleural effusions and compressive atelectasis, increased from 05/07/2023. Pneumonia is difficult to exclude. 3. Bronchitis. 4. Enteric tube is curled in the distal esophagus with tip oriented superiorly in the distal esophagus. Recommend repositioning. 5. Wall thickening and adjacent stranding about the descending portion of the duodenum centered about a duodenal diverticulum. Stranding and fluid about the pancreas. Findings Delph be due to pancreatitis with reactive duodenitis or duodenal diverticulitis. 6. Unchanged indeterminate sclerosis in the T11 vertebral body, right lateral sixth rib, and left glenoid. Similar compression fracture of T11.  rEEG:  ABNORMALITY - Continuous slow, generalized IMPRESSION: This study is suggestive of moderate to severe diffuse encephalopathy. No seizures or epileptiform discharges were seen throughout the recording.  Impression   Gilbert [name] is a 72 y.o. male presenting with supratherapeutic phenytoin level and malnutrition as well as delirium in the setting of phenytoin induced rash  Repeated phenytoin level today given his worsening hypoalbuminemia total phenytoin underestimates free concentration.  However free concentration takes multiple days to result as it is a send out lab.  Repeat level today is a 17.6 which given albumin of 1.7 and creatinine clearance of 23 corrects to approximately 31 (improving from yesterday's corrected level of approximately 41.4).  This correlates with his improving exam   Recommendations   -Continue Keppra 500 every 12 hours -Repeat phenytoin to confirm level is dropping as would be expected given unclear last ingestion -Recommend reaching out to poison control to confirm no other intervention recommended  Repeat level today 17.6, corrects to 31 (improving from yesterday's corrected level of 41.4) -Expect continued gradual improvement; inpatient neurology will sign off at this time but please do not hesitate to reach out if any additional questions or concerns arise, including any concern for breakthrough seizure activity or if patient is not continuing to improve this would be expected -Appreciate excellent management of comorbidities per critical care team, discussed with critical care at bedside and via secure chat  Estimated Creatinine Clearance: 23.6 mL/min (A) (by C-G formula based on SCr of 2.64 mg/dL (H)).  ______________________________________________________________________   Thank you for the opportunity to take part in the care of this patient. If you have any further questions, please contact the neurology consultation team on call. Updated oncall schedule is listed on AMION.  SignedBrooke Dare MD-PhD [facility] [phone]  CRITICAL CARE Performed by:  Shawntell Dixson L Meleena Munroe   Total critical care time: 30 minutes  Critical care time was exclusive of separately billable procedures and treating other patients.  Critical care was necessary to treat or prevent imminent or life-threatening deterioration.  Critical care was time spent personally by me on the following activities: development of treatment plan with patient and/or surrogate as well as nursing, discussions with consultants, evaluation of patient's response to treatment, examination of patient, obtaining history from patient or surrogate, ordering and performing treatments and interventions, ordering and review of laboratory studies, ordering and review of radiographic studies, pulse  oximetry and re-evaluation of patient's condition.
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
## 2023-06-17 NOTE — Progress Notes (Signed)
Pharmacy Antibiotic Note  Gilbert [name] is a 72 y.o. male admitted on 06/15/2023 with possible aspiration pneumonia or intra-abdominal infection. Pharmacy has been consulted for Zosyn dosing.  Today, 06/17/2023 Day 2 of Zosyn  WBC trending up today; 21.7  PCT 1.18 > 2.70  Afebrile  Renal function worsening; SCr 2.64 today with estimated CrCl 23.6 mL/min   Plan: Continue Zosyn 3.375 gm IV Q8H  Monitor renal function closely; nearing cut off for renal dose adjustment  Pharmacy will continue to follow and adjust antibiotic dosing whenever warranted  Temp (24hrs), Avg:97.9 F (36.6 C), Min:94.8 F (34.9 C), Max:99.3 F (37.4 C)   Recent Labs  Lab 06/15/23 1802 06/15/23 1806 06/15/23 2029 06/16/23 0240 06/16/23 1119 06/17/23 0337  WBC 15.5*  --   --  17.4*  --  21.7*  CREATININE  --  2.15*  --  2.30*  --  2.64*  LATICACIDVEN 1.5  --  0.8  --  1.6  --     Estimated Creatinine Clearance: 23.6 mL/min (A) (by C-G formula based on SCr of 2.64 mg/dL (H)).    Allergies  Allergen Reactions   Oxycodone Swelling and Other (See Comments)    Tongue and lips swell   Antimicrobials this admission: 11/13 Cefepime >> x 1 dose 11/13 Flagyl >> x 1 dose 11/13 Vancomycin >> x 1 dose 11/14 Zosyn >>  Microbiology results: 11/13 BCx: NGTD 11/13 UCx: pending    Thank you for allowing pharmacy to be a part of this patient's care.  Littie Deeds, PharmD Pharmacy Resident  06/17/2023 6:42 AM

## 2023-06-17 NOTE — Consult Note (Addendum)
PHARMACY CONSULT NOTE - ELECTROLYTES  Pharmacy Consult for Electrolyte Monitoring and Replacement   Recent Labs: Weight: 74.6 kg (164 lb 7.4 oz) Estimated Creatinine Clearance: 23.6 mL/min (A) (by C-G formula based on SCr of 2.64 mg/dL (H)).  Potassium  Date Value  06/17/2023 4.9 mmol/L  04/20/2017 3.6 mEq/L   Magnesium (mg/dL)  Date Value  29/56/2130 2.2   Calcium (mg/dL)  Date Value  86/57/8469 7.5 (L)  04/20/2017 8.8   Albumin (g/dL)  Date Value  62/95/2841 1.7 (L)  04/20/2017 3.0 (L)   Phosphorus (mg/dL)  Date Value  32/44/0102 5.0 (H)   Sodium  Date Value  06/17/2023 141 mmol/L  12/10/2021 142 mmol/L  04/20/2017 140 mEq/L   Assessment  Gilbert [name] is a 72 y.o. male presenting with possible pancreatitis and duodenal diverticulitis. 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 Pharmacy has been consulted to monitor and replace electrolytes.  Diet: NPO MIVF: LR @ 100 mL/hr    Goal of Therapy: Electrolytes WNL  Plan:  No replacement indicated at this time  Check BMP, Mg, Phos with AM labs  Thank you for allowing pharmacy to be a part of this patient's care.  Littie Deeds, PharmD Pharmacy Resident  06/17/2023 6:35 AM

## 2023-06-17 NOTE — TOC Initial Note (Signed)
Transition of Care [facility]) - Initial/Assessment Note    Patient Details  Name: Gilbert [name] MRN: [id number] Date of Birth: 09-Feb-1951  Transition of Care [facility]) CM/SW Contact:    Allena Katz, LCSW Phone Number: 06/17/2023, 2:15 PM  Clinical Narrative:   Pt admitted with AMS. Pt actively intubated. CSW following for needs.                       Patient Goals and CMS Choice            Expected Discharge Plan and Services                                              Prior Living Arrangements/Services                       Activities of Daily Living      Permission Sought/Granted                  Emotional Assessment              Admission diagnosis:  Hallucination [R44.3] Altered mental status [R41.82] Hypothermia, initial encounter [T68.XXXA] Accidental phenytoin poisoning, initial encounter [T42.0X1A] Patient Active Problem List   Diagnosis Date Noted   Altered mental status 06/15/2023   Malnutrition of moderate degree 05/09/2023   Hyperkalemia 05/06/2023   Metabolic acidosis 05/06/2023   Acute systolic heart failure (HCC)    Mixed hyperlipidemia 12/24/2019   Chronic systolic heart failure (HCC) 03/23/2019   CHF (congestive heart failure) (HCC) 06/01/2017   Acute kidney injury superimposed on chronic kidney disease (HCC) 04/07/2017   Orthostatic hypotension 04/07/2017   Diffuse non-Hodgkin's lymphoma of bone (HCC) 01/08/2016   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/07/2016   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   Hematuria 10/29/2015   Dysphagia, pharyngoesophageal phase    Early satiety    Esophageal stricture    Gastritis and gastroduodenitis    PAD (peripheral artery disease) (HCC) 05/28/2014   Ischemic cardiomyopathy 04/03/2014   History of left heart catheterization (LHC) 02/26/2014   Arterial bleed, intraoperative 02/26/2014   Seizures (HCC) 11/27/2013   Essential hypertension 11/27/2013    Hypomagnesemia 11/27/2013   Coronary artery disease involving native coronary artery of native heart without angina pectoris 11/26/2013   Renal cancer (HCC) 10/19/2013   Coronary artery calcification 09/25/2013   Renal cyst 08/30/2013   Loss of weight 08/03/2013   PCP:  Shade Flood, MD Pharmacy:   [facility] [id number]), [place] - [address] [place] ([place]) [place] [address] Phone: [phone] Fax: [phone]     Social Determinants of Health (SDOH) Social History: SDOH Screenings   Food Insecurity: Food Insecurity Present (05/13/2023)  Housing: Low Risk  (05/06/2023)  Transportation Needs: No Transportation Needs (05/13/2023)  Utilities: Not At Risk (05/06/2023)  Alcohol Screen: Low Risk  (03/11/2022)  Depression (PHQ2-9): Low Risk  (02/24/2023)  Financial Resource Strain: Low Risk  (03/11/2022)  Physical Activity: Insufficiently Active (03/11/2022)  Social Connections: Socially Isolated (03/11/2022)  Stress: No Stress Concern Present (03/11/2022)  Tobacco Use: High Risk (06/15/2023)   SDOH Interventions:     Readmission Risk Interventions    05/10/2023   11:56 AM  Readmission Risk Prevention Plan  Transportation Screening Complete  PCP or Specialist Appt within 3-5 Days Complete  HRI or Home Care Consult Complete  Social Work Consult for Recovery Care Planning/Counseling Complete  Palliative Care Screening Complete  Medication Review Ocean[employer]) Complete

## 2023-06-18 ENCOUNTER — Inpatient Hospital Stay (HOSPITAL_COMMUNITY)
Admit: 2023-06-18 | Discharge: 2023-06-18 | Disposition: A | Discharge status: Home / Self Care | Payer: Medicare Other | Attending: Critical Care Medicine | Admitting: Critical Care Medicine

## 2023-06-18 ENCOUNTER — Inpatient Hospital Stay: Payer: Medicare Other

## 2023-06-18 ENCOUNTER — Other Ambulatory Visit: Payer: Self-pay

## 2023-06-18 DIAGNOSIS — J9601 Acute respiratory failure with hypoxia: Secondary | ICD-10-CM | POA: Diagnosis not present

## 2023-06-18 DIAGNOSIS — R579 Shock, unspecified: Secondary | ICD-10-CM | POA: Diagnosis not present

## 2023-06-18 DIAGNOSIS — R0609 Other forms of dyspnea: Secondary | ICD-10-CM

## 2023-06-18 DIAGNOSIS — T420X1A Poisoning by hydantoin derivatives, accidental (unintentional), initial encounter: Secondary | ICD-10-CM | POA: Diagnosis not present

## 2023-06-18 DIAGNOSIS — N179 Acute kidney failure, unspecified: Secondary | ICD-10-CM | POA: Diagnosis not present

## 2023-06-18 DIAGNOSIS — I5021 Acute systolic (congestive) heart failure: Secondary | ICD-10-CM | POA: Diagnosis not present

## 2023-06-18 DIAGNOSIS — G928 Other toxic encephalopathy: Secondary | ICD-10-CM | POA: Diagnosis not present

## 2023-06-18 LAB — URINALYSIS, ROUTINE W REFLEX MICROSCOPIC
Bacteria, UA: NONE SEEN
Bilirubin Urine: NEGATIVE
Glucose, UA: NEGATIVE mg/dL
Hgb urine dipstick: NEGATIVE
Ketones, ur: NEGATIVE mg/dL
Nitrite: NEGATIVE
Protein, ur: 30 mg/dL — AB
Specific Gravity, Urine: 1.027 (ref 1.005–1.030)
Squamous Epithelial / HPF: 0 /[HPF] (ref 0–5)
pH: 5 (ref 5.0–8.0)

## 2023-06-18 LAB — LACTIC ACID, PLASMA
Lactic Acid, Venous: 1.6 mmol/L (ref 0.5–1.9)
Lactic Acid, Venous: 1.8 mmol/L (ref 0.5–1.9)

## 2023-06-18 LAB — MRSA NEXT GEN BY PCR, NASAL: MRSA by PCR Next Gen: NOT DETECTED

## 2023-06-18 LAB — CBC
HCT: 31.3 % — ABNORMAL LOW (ref 39.0–52.0)
Hemoglobin: 9.8 g/dL — ABNORMAL LOW (ref 13.0–17.0)
MCH: 28.7 pg (ref 26.0–34.0)
MCHC: 31.3 g/dL (ref 30.0–36.0)
MCV: 91.5 fL (ref 80.0–100.0)
Platelets: 131 10*3/uL — ABNORMAL LOW (ref 150–400)
RBC: 3.42 MIL/uL — ABNORMAL LOW (ref 4.22–5.81)
RDW: 17.8 % — ABNORMAL HIGH (ref 11.5–15.5)
WBC: 33.2 10*3/uL — ABNORMAL HIGH (ref 4.0–10.5)
nRBC: 0.1 % (ref 0.0–0.2)

## 2023-06-18 LAB — PHOSPHORUS: Phosphorus: 4.1 mg/dL (ref 2.5–4.6)

## 2023-06-18 LAB — ECHOCARDIOGRAM COMPLETE
AR max vel: 2.75 cm2
AV Peak grad: 4.2 mm[Hg]
Ao pk vel: 1.03 m/s
Area-P 1/2: 3 cm2
Calc EF: 46.8 %
S' Lateral: 4.45 cm
Single Plane A2C EF: 49.3 %
Single Plane A4C EF: 39 %
Weight: 2701.96 [oz_av]

## 2023-06-18 LAB — BASIC METABOLIC PANEL
Anion gap: 9 (ref 5–15)
BUN: 54 mg/dL — ABNORMAL HIGH (ref 8–23)
CO2: 19 mmol/L — ABNORMAL LOW (ref 22–32)
Calcium: 7.3 mg/dL — ABNORMAL LOW (ref 8.9–10.3)
Chloride: 112 mmol/L — ABNORMAL HIGH (ref 98–111)
Creatinine, Ser: 2.41 mg/dL — ABNORMAL HIGH (ref 0.61–1.24)
GFR, Estimated: 28 mL/min — ABNORMAL LOW (ref >60)
Glucose, Bld: 220 mg/dL — ABNORMAL HIGH (ref 70–99)
Potassium: 4.7 mmol/L (ref 3.5–5.1)
Sodium: 140 mmol/L (ref 135–145)

## 2023-06-18 LAB — PROCALCITONIN: Procalcitonin: 4.56 ng/mL

## 2023-06-18 LAB — GLUCOSE, CAPILLARY
Glucose-Capillary: 183 mg/dL — ABNORMAL HIGH (ref 70–99)
Glucose-Capillary: 202 mg/dL — ABNORMAL HIGH (ref 70–99)
Glucose-Capillary: 204 mg/dL — ABNORMAL HIGH (ref 70–99)
Glucose-Capillary: 185 mg/dL — ABNORMAL HIGH (ref 70–99)
Glucose-Capillary: 175 mg/dL — ABNORMAL HIGH (ref 70–99)
Glucose-Capillary: 202 mg/dL — ABNORMAL HIGH (ref 70–99)

## 2023-06-18 LAB — MAGNESIUM: Magnesium: 2.1 mg/dL (ref 1.7–2.4)

## 2023-06-18 MED ORDER — PROPOFOL 1000 MG/100ML IV EMUL
0.0000 ug/kg/min | INTRAVENOUS | Status: DC
  Administered 2023-06-19: 30 ug/kg/min via INTRAVENOUS
  Administered 2023-06-20 (×2): 25 ug/kg/min via INTRAVENOUS
  Administered 2023-06-21 (×3): 20 ug/kg/min via INTRAVENOUS
  Administered 2023-06-22: 25 ug/kg/min via INTRAVENOUS
  Administered 2023-06-22: 10 ug/kg/min via INTRAVENOUS
  Administered 2023-06-23 (×2): 20 ug/kg/min via INTRAVENOUS
  Administered 2023-06-23: 30 ug/kg/min via INTRAVENOUS
  Administered 2023-06-24: 35 ug/kg/min via INTRAVENOUS
  Filled 2023-06-18 (×13): qty 100

## 2023-06-18 MED ORDER — VASOPRESSIN 20 UNITS/100 ML INFUSION FOR SHOCK
0.0000 [IU]/min | INTRAVENOUS | Status: DC
  Administered 2023-06-18 (×2): 0.04 [IU]/min via INTRAVENOUS
  Administered 2023-06-19: 0.03 [IU]/min via INTRAVENOUS
  Filled 2023-06-18 (×3): qty 100

## 2023-06-18 MED ORDER — INSULIN ASPART 100 UNIT/ML IJ SOLN
1.0000 [IU] | INTRAMUSCULAR | Status: DC
  Administered 2023-06-18: 2 [IU] via SUBCUTANEOUS
  Administered 2023-06-18: 3 [IU] via SUBCUTANEOUS
  Administered 2023-06-18 – 2023-06-19 (×2): 2 [IU] via SUBCUTANEOUS
  Administered 2023-06-19: 1 [IU] via SUBCUTANEOUS
  Administered 2023-06-19 (×4): 2 [IU] via SUBCUTANEOUS
  Administered 2023-06-20 (×2): 3 [IU] via SUBCUTANEOUS
  Administered 2023-06-20 (×2): 2 [IU] via SUBCUTANEOUS
  Administered 2023-06-20 – 2023-06-21 (×4): 1 [IU] via SUBCUTANEOUS
  Administered 2023-06-21: 2 [IU] via SUBCUTANEOUS
  Administered 2023-06-22: 3 [IU] via SUBCUTANEOUS
  Administered 2023-06-22: 2 [IU] via SUBCUTANEOUS
  Filled 2023-06-18 (×20): qty 1

## 2023-06-18 NOTE — Progress Notes (Signed)
Pharmacy Antibiotic Note  Gilbert [name] is a 72 y.o. male admitted on 06/15/2023 with possible aspiration pneumonia or intra-abdominal infection. Pharmacy has been consulted for Zosyn dosing.  Today, 06/17/2023 Day 3 of Zosyn  WBC trending up today; 33.7  PCT 1.18 > 2.70 > 4.56 Afebrile  Renal function slightly improved; SCr 2.64> 2.41,  Crcl 25.9 ml/min  Plan: Continue Zosyn 3.375 gm IV Q8H  Monitor renal function closely; Pharmacy will continue to follow and adjust antibiotic dosing as warranted  Temp (24hrs), Avg:97.6 F (36.4 C), Min:96.8 F (36 C), Max:98.2 F (36.8 C)   Recent Labs  Lab 06/15/23 1802 06/15/23 1806 06/15/23 2029 06/16/23 0240 06/16/23 1119 06/17/23 0337 06/18/23 0407  WBC 15.5*  --   --  17.4*  --  21.7* 33.2*  CREATININE  --  2.15*  --  2.30*  --  2.64* 2.41*  LATICACIDVEN 1.5  --  0.8  --  1.6  --   --     Estimated Creatinine Clearance: 25.9 mL/min (A) (by C-G formula based on SCr of 2.41 mg/dL (H)).    Allergies  Allergen Reactions   Oxycodone Swelling and Other (See Comments)    Tongue and lips swell   Antimicrobials this admission: 11/13 Cefepime >> x 1 dose 11/13 Flagyl >> x 1 dose 11/13 Vancomycin >> x 1 dose 11/14 Zosyn >>  Microbiology results: 11/13 BCx: NGTD 11/13 OZH:YQMV   Thank you for allowing pharmacy to be a part of this patient's care.  Marcques Wrightsman A, PharmD 06/18/2023 10:33 AM

## 2023-06-18 NOTE — Consult Note (Signed)
PHARMACY CONSULT NOTE - ELECTROLYTES  Pharmacy Consult for Electrolyte Monitoring and Replacement   Recent Labs: Weight: 76.6 kg (168 lb 14 oz) Estimated Creatinine Clearance: 25.9 mL/min (A) (by C-G formula based on SCr of 2.41 mg/dL (H)).  Potassium  Date Value  06/18/2023 4.7 mmol/L  04/20/2017 3.6 mEq/L   Magnesium (mg/dL)  Date Value  56/21/3086 2.1   Calcium (mg/dL)  Date Value  57/84/6962 7.3 (L)  04/20/2017 8.8   Albumin (g/dL)  Date Value  95/28/4132 1.7 (L)  04/20/2017 3.0 (L)   Phosphorus (mg/dL)  Date Value  44/08/270 4.1   Sodium  Date Value  06/18/2023 140 mmol/L  12/10/2021 142 mmol/L  04/20/2017 140 mEq/L   Assessment  Gilbert [name] is a 72 y.o. male presenting with possible pancreatitis and duodenal diverticulitis. 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 Pharmacy has been consulted to monitor and replace electrolytes.  Goal of Therapy: Electrolytes WNL  Plan:  No replacement indicated at this time  Check BMP, Mg, Phos with AM labs  Thank you for allowing pharmacy to be a part of this patient's care.  Lowella Bandy, PharmD, BCPS 06/18/2023 7:45 AM

## 2023-06-18 NOTE — Plan of Care (Signed)
  Problem: Clinical Measurements: Goal: Ability to maintain clinical measurements within normal limits will improve Outcome: Progressing Goal: Respiratory complications will improve Outcome: Progressing Goal: Cardiovascular complication will be avoided Outcome: Progressing   Problem: Nutrition: Goal: Adequate nutrition will be maintained Outcome: Progressing   Problem: Skin Integrity: Goal: Risk for impaired skin integrity will decrease Outcome: Progressing

## 2023-06-18 NOTE — Procedures (Signed)
Arterial Catheter Insertion Procedure Note  Gilbert [name]  [id number]  01/07/1951  Date:06/18/23  Time:7:23 PM    Provider Performing: Lianne Bushy Monie Shere    Procedure: Insertion of Arterial Line (25956) with US guidance (38756)   Indication(s) Blood pressure monitoring and/or need for frequent ABGs  Consent Risks of the procedure as well as the alternatives and risks of each were explained to the patient and/or caregiver.  Consent for the procedure was obtained and is signed in the bedside chart  Anesthesia None   Time Out Verified patient identification, verified procedure, site/side was marked, verified correct patient position, special equipment/implants available, medications/allergies/relevant history reviewed, required imaging and test results available.   Sterile Technique Maximal sterile technique including full sterile barrier drape, hand hygiene, sterile gown, sterile gloves, mask, hair covering, sterile ultrasound probe cover (if used).   Procedure Description Area of catheter insertion was cleaned with chlorhexidine and draped in sterile fashion. With real-time ultrasound guidance an arterial catheter was placed into the left radial artery.  Appropriate arterial tracings confirmed on monitor.     Complications/Tolerance None; patient tolerated the procedure well.   EBL Minimal   Specimen(s) None  Gilbert Chute, MD [facility] 06/18/2023 7:23 PM
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
## 2023-06-18 NOTE — Progress Notes (Signed)
  Echocardiogram 2D Echocardiogram has been performed.  Gilbert [name] 06/18/2023, 9:40 AM

## 2023-06-19 DIAGNOSIS — T420X1A Poisoning by hydantoin derivatives, accidental (unintentional), initial encounter: Secondary | ICD-10-CM | POA: Diagnosis not present

## 2023-06-19 DIAGNOSIS — R579 Shock, unspecified: Secondary | ICD-10-CM | POA: Diagnosis not present

## 2023-06-19 DIAGNOSIS — G928 Other toxic encephalopathy: Secondary | ICD-10-CM | POA: Diagnosis not present

## 2023-06-19 DIAGNOSIS — J9601 Acute respiratory failure with hypoxia: Secondary | ICD-10-CM | POA: Diagnosis not present

## 2023-06-19 LAB — COMPREHENSIVE METABOLIC PANEL
ALT: 33 U/L (ref 0–44)
AST: 46 U/L — ABNORMAL HIGH (ref 15–41)
Albumin: 1.5 g/dL — ABNORMAL LOW (ref 3.5–5.0)
Alkaline Phosphatase: 293 U/L — ABNORMAL HIGH (ref 38–126)
Anion gap: 8 (ref 5–15)
BUN: 55 mg/dL — ABNORMAL HIGH (ref 8–23)
CO2: 19 mmol/L — ABNORMAL LOW (ref 22–32)
Calcium: 7.4 mg/dL — ABNORMAL LOW (ref 8.9–10.3)
Chloride: 114 mmol/L — ABNORMAL HIGH (ref 98–111)
Creatinine, Ser: 2.17 mg/dL — ABNORMAL HIGH (ref 0.61–1.24)
GFR, Estimated: 32 mL/min — ABNORMAL LOW (ref >60)
Glucose, Bld: 190 mg/dL — ABNORMAL HIGH (ref 70–99)
Potassium: 4 mmol/L (ref 3.5–5.1)
Sodium: 141 mmol/L (ref 135–145)
Total Bilirubin: 0.9 mg/dL (ref <1.2)
Total Protein: 4.2 g/dL — ABNORMAL LOW (ref 6.5–8.1)

## 2023-06-19 LAB — CBC WITH DIFFERENTIAL/PLATELET
Abs Immature Granulocytes: 0.24 10*3/uL — ABNORMAL HIGH (ref 0.00–0.07)
Basophils Absolute: 0 10*3/uL (ref 0.0–0.1)
Basophils Relative: 0 %
Eosinophils Absolute: 0.2 10*3/uL (ref 0.0–0.5)
Eosinophils Relative: 1 %
HCT: 26.6 % — ABNORMAL LOW (ref 39.0–52.0)
Hemoglobin: 8.2 g/dL — ABNORMAL LOW (ref 13.0–17.0)
Immature Granulocytes: 1 %
Lymphocytes Relative: 8 %
Lymphs Abs: 1.5 10*3/uL (ref 0.7–4.0)
MCH: 28.9 pg (ref 26.0–34.0)
MCHC: 30.8 g/dL (ref 30.0–36.0)
MCV: 93.7 fL (ref 80.0–100.0)
Monocytes Absolute: 1.9 10*3/uL — ABNORMAL HIGH (ref 0.1–1.0)
Monocytes Relative: 10 %
Neutro Abs: 15.8 10*3/uL — ABNORMAL HIGH (ref 1.7–7.7)
Neutrophils Relative %: 80 %
Platelets: 109 10*3/uL — ABNORMAL LOW (ref 150–400)
RBC: 2.84 MIL/uL — ABNORMAL LOW (ref 4.22–5.81)
RDW: 18.1 % — ABNORMAL HIGH (ref 11.5–15.5)
WBC: 19.6 10*3/uL — ABNORMAL HIGH (ref 4.0–10.5)
nRBC: 0.1 % (ref 0.0–0.2)

## 2023-06-19 LAB — PHOSPHORUS: Phosphorus: 2.9 mg/dL (ref 2.5–4.6)

## 2023-06-19 LAB — GLUCOSE, CAPILLARY
Glucose-Capillary: 157 mg/dL — ABNORMAL HIGH (ref 70–99)
Glucose-Capillary: 171 mg/dL — ABNORMAL HIGH (ref 70–99)
Glucose-Capillary: 136 mg/dL — ABNORMAL HIGH (ref 70–99)
Glucose-Capillary: 154 mg/dL — ABNORMAL HIGH (ref 70–99)
Glucose-Capillary: 182 mg/dL — ABNORMAL HIGH (ref 70–99)
Glucose-Capillary: 194 mg/dL — ABNORMAL HIGH (ref 70–99)

## 2023-06-19 LAB — TRIGLYCERIDES: Triglycerides: 154 mg/dL — ABNORMAL HIGH (ref <150)

## 2023-06-19 LAB — PHENYTOIN LEVEL, TOTAL: Phenytoin Lvl: 14.6 ug/mL (ref 10.0–20.0)

## 2023-06-19 LAB — MAGNESIUM: Magnesium: 2.1 mg/dL (ref 1.7–2.4)

## 2023-06-19 NOTE — Plan of Care (Signed)
  Problem: Clinical Measurements: Goal: Ability to maintain clinical measurements within normal limits will improve Outcome: Progressing Goal: Will remain free from infection Outcome: Progressing Goal: Diagnostic test results will improve Outcome: Progressing Goal: Respiratory complications will improve Outcome: Progressing Goal: Cardiovascular complication will be avoided Outcome: Progressing   Problem: Activity: Goal: Risk for activity intolerance will decrease Outcome: Progressing   Problem: Nutrition: Goal: Adequate nutrition will be maintained Outcome: Progressing   Problem: Coping: Goal: Level of anxiety will decrease Outcome: Progressing   Problem: Elimination: Goal: Will not experience complications related to bowel motility Outcome: Progressing   Problem: Elimination: Goal: Will not experience complications related to urinary retention Outcome: Not Applicable
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
## 2023-06-19 NOTE — Consult Note (Signed)
PHARMACY CONSULT NOTE - ELECTROLYTES  Pharmacy Consult for Electrolyte Monitoring and Replacement   Recent Labs: Weight: 78.3 kg (172 lb 9.9 oz) Estimated Creatinine Clearance: 28.8 mL/min (A) (by C-G formula based on SCr of 2.17 mg/dL (H)).  Potassium  Date Value  06/19/2023 4.0 mmol/L  04/20/2017 3.6 mEq/L   Magnesium (mg/dL)  Date Value  10/93/2355 2.1   Calcium (mg/dL)  Date Value  73/22/0254 7.4 (L)  04/20/2017 8.8   Albumin (g/dL)  Date Value  27/01/2375 1.5 (L)  04/20/2017 3.0 (L)   Phosphorus (mg/dL)  Date Value  28/31/5176 2.9   Sodium  Date Value  06/19/2023 141 mmol/L  12/10/2021 142 mmol/L  04/20/2017 140 mEq/L   Assessment  Gilbert [name] is a 72 y.o. male presenting with possible pancreatitis and duodenal diverticulitis. 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 Pharmacy has been consulted to monitor and replace electrolytes.  Goal of Therapy: Electrolytes WNL  Plan:  No replacement indicated at this time  Check BMP, Mg, Phos with AM labs  Thank you for allowing pharmacy to be a part of this patient's care.  Lowella Bandy, PharmD, BCPS 06/19/2023 7:31 AM

## 2023-06-20 LAB — BASIC METABOLIC PANEL
Anion gap: 8 (ref 5–15)
BUN: 50 mg/dL — ABNORMAL HIGH (ref 8–23)
CO2: 18 mmol/L — ABNORMAL LOW (ref 22–32)
Calcium: 7 mg/dL — ABNORMAL LOW (ref 8.9–10.3)
Chloride: 116 mmol/L — ABNORMAL HIGH (ref 98–111)
Creatinine, Ser: 1.81 mg/dL — ABNORMAL HIGH (ref 0.61–1.24)
GFR, Estimated: 39 mL/min — ABNORMAL LOW (ref >60)
Glucose, Bld: 231 mg/dL — ABNORMAL HIGH (ref 70–99)
Potassium: 3.2 mmol/L — ABNORMAL LOW (ref 3.5–5.1)
Sodium: 142 mmol/L (ref 135–145)

## 2023-06-20 LAB — CULTURE, RESPIRATORY W GRAM STAIN

## 2023-06-20 LAB — CBC
HCT: 29.1 % — ABNORMAL LOW (ref 39.0–52.0)
Hemoglobin: 9 g/dL — ABNORMAL LOW (ref 13.0–17.0)
MCH: 28.1 pg (ref 26.0–34.0)
MCHC: 30.9 g/dL (ref 30.0–36.0)
MCV: 90.9 fL (ref 80.0–100.0)
Platelets: 108 10*3/uL — ABNORMAL LOW (ref 150–400)
RBC: 3.2 MIL/uL — ABNORMAL LOW (ref 4.22–5.81)
RDW: 18.4 % — ABNORMAL HIGH (ref 11.5–15.5)
WBC: 14 10*3/uL — ABNORMAL HIGH (ref 4.0–10.5)
nRBC: 0.2 % (ref 0.0–0.2)

## 2023-06-20 LAB — CULTURE, BLOOD (SINGLE)
Culture: NO GROWTH
Culture: NO GROWTH
Special Requests: ADEQUATE

## 2023-06-20 LAB — GLUCOSE, CAPILLARY
Glucose-Capillary: 218 mg/dL — ABNORMAL HIGH (ref 70–99)
Glucose-Capillary: 183 mg/dL — ABNORMAL HIGH (ref 70–99)
Glucose-Capillary: 144 mg/dL — ABNORMAL HIGH (ref 70–99)
Glucose-Capillary: 120 mg/dL — ABNORMAL HIGH (ref 70–99)
Glucose-Capillary: 200 mg/dL — ABNORMAL HIGH (ref 70–99)
Glucose-Capillary: 203 mg/dL — ABNORMAL HIGH (ref 70–99)

## 2023-06-20 LAB — PHENYTOIN LEVEL, FREE AND TOTAL
Phenytoin, Free: 5.2 ug/mL — ABNORMAL HIGH (ref 1.0–2.0)
Phenytoin, Total: 14.5 ug/mL (ref 10.0–20.0)

## 2023-06-20 MED ORDER — POTASSIUM CHLORIDE 20 MEQ PO PACK
40.0000 meq | PACK | Freq: Once | ORAL | Status: AC
  Administered 2023-06-20: 40 meq
  Filled 2023-06-20: qty 2

## 2023-06-20 NOTE — Consult Note (Signed)
PHARMACY CONSULT NOTE - ELECTROLYTES  Pharmacy Consult for Electrolyte Monitoring and Replacement   Recent Labs: Weight: 79.8 kg (175 lb 14.8 oz) Estimated Creatinine Clearance: 37.4 mL/min (A) (by C-G formula based on SCr of 1.81 mg/dL (H)).  Potassium  Date Value  06/20/2023 3.2 mmol/L (L)  04/20/2017 3.6 mEq/L   Magnesium (mg/dL)  Date Value  16/05/9603 2.1   Calcium (mg/dL)  Date Value  54/04/8118 7.0 (L)  04/20/2017 8.8   Albumin (g/dL)  Date Value  14/78/2956 1.5 (L)  04/20/2017 3.0 (L)   Phosphorus (mg/dL)  Date Value  21/30/8657 2.9   Sodium  Date Value  06/20/2023 142 mmol/L  12/10/2021 142 mmol/L  04/20/2017 140 mEq/L   Assessment  Gilbert [name] is a 72 y.o. male presenting with possible pancreatitis and duodenal diverticulitis. 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 Pharmacy has been consulted to monitor and replace electrolytes.  Goal of Therapy: Electrolytes WNL  Plan:  --K 3.2, Kcl 40 mEq per tube x 1 dose --Follow-up electrolytes with AM labs tomorrow  Thank you for allowing pharmacy to be a part of this patient's care.  Tressie Ellis 06/20/2023 7:47 AM
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
## 2023-06-20 NOTE — Plan of Care (Signed)
  Problem: Nutrition: Goal: Adequate nutrition will be maintained Outcome: Progressing   Problem: Elimination: Goal: Will not experience complications related to bowel motility Outcome: Progressing   Problem: Pain Management: Goal: General experience of comfort will improve Outcome: Progressing   Problem: Safety: Goal: Ability to remain free from injury will improve Outcome: Progressing

## 2023-06-21 ENCOUNTER — Inpatient Hospital Stay: Payer: Medicare Other

## 2023-06-21 DIAGNOSIS — Z7189 Other specified counseling: Secondary | ICD-10-CM | POA: Diagnosis not present

## 2023-06-21 DIAGNOSIS — J9601 Acute respiratory failure with hypoxia: Secondary | ICD-10-CM | POA: Diagnosis not present

## 2023-06-21 LAB — CBC
HCT: 28.9 % — ABNORMAL LOW (ref 39.0–52.0)
Hemoglobin: 8.7 g/dL — ABNORMAL LOW (ref 13.0–17.0)
MCH: 28.3 pg (ref 26.0–34.0)
MCHC: 30.1 g/dL (ref 30.0–36.0)
MCV: 94.1 fL (ref 80.0–100.0)
Platelets: 123 10*3/uL — ABNORMAL LOW (ref 150–400)
RBC: 3.07 MIL/uL — ABNORMAL LOW (ref 4.22–5.81)
RDW: 18.8 % — ABNORMAL HIGH (ref 11.5–15.5)
WBC: 12.5 10*3/uL — ABNORMAL HIGH (ref 4.0–10.5)
nRBC: 0.3 % — ABNORMAL HIGH (ref 0.0–0.2)

## 2023-06-21 LAB — PHOSPHORUS: Phosphorus: 2.7 mg/dL (ref 2.5–4.6)

## 2023-06-21 LAB — BASIC METABOLIC PANEL
Anion gap: 5 (ref 5–15)
BUN: 50 mg/dL — ABNORMAL HIGH (ref 8–23)
CO2: 20 mmol/L — ABNORMAL LOW (ref 22–32)
Calcium: 7.5 mg/dL — ABNORMAL LOW (ref 8.9–10.3)
Chloride: 119 mmol/L — ABNORMAL HIGH (ref 98–111)
Creatinine, Ser: 1.78 mg/dL — ABNORMAL HIGH (ref 0.61–1.24)
GFR, Estimated: 40 mL/min — ABNORMAL LOW (ref >60)
Glucose, Bld: 158 mg/dL — ABNORMAL HIGH (ref 70–99)
Potassium: 3.8 mmol/L (ref 3.5–5.1)
Sodium: 144 mmol/L (ref 135–145)

## 2023-06-21 LAB — GLUCOSE, CAPILLARY
Glucose-Capillary: 138 mg/dL — ABNORMAL HIGH (ref 70–99)
Glucose-Capillary: 125 mg/dL — ABNORMAL HIGH (ref 70–99)
Glucose-Capillary: 118 mg/dL — ABNORMAL HIGH (ref 70–99)
Glucose-Capillary: 109 mg/dL — ABNORMAL HIGH (ref 70–99)
Glucose-Capillary: 130 mg/dL — ABNORMAL HIGH (ref 70–99)
Glucose-Capillary: 172 mg/dL — ABNORMAL HIGH (ref 70–99)

## 2023-06-21 LAB — MAGNESIUM: Magnesium: 1.9 mg/dL (ref 1.7–2.4)

## 2023-06-21 MED ORDER — MIDAZOLAM HCL 2 MG/2ML IJ SOLN
INTRAMUSCULAR | Status: AC
  Filled 2023-06-21: qty 4

## 2023-06-21 MED ORDER — ETOMIDATE 2 MG/ML IV SOLN
20.0000 mg | Freq: Once | INTRAVENOUS | Status: AC
  Administered 2023-06-21: 20 mg via INTRAVENOUS

## 2023-06-21 MED ORDER — ETOMIDATE 2 MG/ML IV SOLN
INTRAVENOUS | Status: AC
  Filled 2023-06-21: qty 10

## 2023-06-21 MED ORDER — MIDAZOLAM HCL 2 MG/2ML IJ SOLN
4.0000 mg | Freq: Once | INTRAMUSCULAR | Status: AC
  Administered 2023-06-21: 2 mg via INTRAVENOUS

## 2023-06-21 MED ORDER — ALBUMIN HUMAN 25 % IV SOLN
25.0000 g | Freq: Once | INTRAVENOUS | Status: AC
  Administered 2023-06-21: 25 g via INTRAVENOUS
  Filled 2023-06-21: qty 100

## 2023-06-21 NOTE — Progress Notes (Signed)
Vent alarming, walked in. ETT almost out, RT called to bedside. NP Annabelle Harman called to bedside.

## 2023-06-21 NOTE — Consult Note (Signed)
PHARMACY CONSULT NOTE - ELECTROLYTES  Pharmacy Consult for Electrolyte Monitoring and Replacement   Recent Labs: Weight: 75.9 kg (167 lb 5.3 oz) Estimated Creatinine Clearance: 35.1 mL/min (A) (by C-G formula based on SCr of 1.78 mg/dL (H)).  Potassium  Date Value  06/21/2023 3.8 mmol/L  04/20/2017 3.6 mEq/L   Magnesium (mg/dL)  Date Value  40/98/1191 1.9   Calcium (mg/dL)  Date Value  47/82/9562 7.5 (L)  04/20/2017 8.8   Albumin (g/dL)  Date Value  13/03/6577 1.5 (L)  04/20/2017 3.0 (L)   Phosphorus (mg/dL)  Date Value  46/96/2952 2.7   Sodium  Date Value  06/21/2023 144 mmol/L  12/10/2021 142 mmol/L  04/20/2017 140 mEq/L   Assessment  Gilbert [name] is a 72 y.o. male presenting with possible pancreatitis and duodenal diverticulitis. 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 Pharmacy has been consulted to monitor and replace electrolytes.  Goal of Therapy: Electrolytes WNL  Plan:  --No electrolyte replacement indicated at this time --Follow-up electrolytes with AM labs tomorrow  Thank you for allowing pharmacy to be a part of this patient's care.  Tressie Ellis 06/21/2023 7:40 AM
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
## 2023-06-21 NOTE — Progress Notes (Signed)
Nutrition Follow Up Note   DOCUMENTATION CODES:   Non-severe (moderate) malnutrition in context of chronic illness  INTERVENTION:   Continue Vital 1.2@55ml /hr + ProSource TF 20- Give 60ml daily via tube  Free water flushes 30ml q4 hours to maintain tube patency   Regimen provides 1664kcal/day, 119g/day protein and 1260ml/day of free water.   Daily weights   Thiamine 100mg  daily via tube  Folic acid 1mg  daily via tube   NUTRITION DIAGNOSIS:   Moderate Malnutrition related to chronic illness as evidenced by mild fat depletion, severe fat depletion, moderate muscle depletion, severe muscle depletion. -ongoing   GOAL:   Provide needs based on ASPEN/SCCM guidelines -met   MONITOR:   Vent status, Labs, Weight trends, I & O's, Skin, TF tolerance  ASSESSMENT:   72 y/o male with h/o seizures, HTN, HLD, PAD, CAD, CHF, B cell lymphoma (diagnosed 2017, treated with chemotherapy), compression fracture, ICM, GERD, MI, CKD III, stage III renal carcinoma s/p R nephrectomy (2015) and chronic meningioma who is admitted with AKI, AMS, SIRS, possible pancreatitis and generalized rash suspected from Dilantin reaction.  Pt remains sedated and ventilated. OGT in place (gastric) and pt has been tolerating tube feeds well at goal rate. Tube feeds on hold for possible extubation today after bronchoscopy. Will plan to resume tube feeds if pt does not extubate. Refeed labs stable. Per chart, pt is up ~5lbs since admission. Pt is noted to have anasarca. Pt +11.5L on his I & Os.     Medications reviewed and include: plavix, folic acid, heparin, insulin, protonix, thiamine, zosyn  Labs reviewed: K 3.8 wnl, BUN 50(H), creat 1.78(H), P 2.7 wnl, Mg 1.9 wnl Amylase- 190(H), lipase 236(H) Folate- 5.4(L)- 11/14 Wbc- 12.5(H), Hgb 8.7(L), Hct 28.9(L) Cbgs- 118, 125, 138 x 24 hrs   Patient is currently intubated on ventilator support MV: 7.6 L/min Temp (24hrs), Avg:98.2 F (36.8 C), Min:97.9 F (36.6 C),  Max:98.5 F (36.9 C)  Propofol: on hold   UOP-   NUTRITION - FOCUSED PHYSICAL EXAM:  Flowsheet Row Most Recent Value  Orbital Region Mild depletion  Upper Arm Region Severe depletion  Thoracic and Lumbar Region Mild depletion  Buccal Region Mild depletion  Temple Region Mild depletion  Clavicle Bone Region Moderate depletion  Clavicle and Acromion Bone Region Moderate depletion  Scapular Bone Region No depletion  Dorsal Hand Unable to assess  Patellar Region Severe depletion  Anterior Thigh Region Severe depletion  Posterior Calf Region Severe depletion  Edema (RD Assessment) Mild  Hair Reviewed  Eyes Reviewed  Mouth Reviewed  Skin Reviewed  Nails Reviewed   Diet Order:   Diet Order             Diet NPO time specified  Diet effective now                  EDUCATION NEEDS:   No education needs have been identified at this time  Skin:  Skin Assessment: Reviewed RN Assessment (rash)  Last BM:  11/18- type 7 via rectal tube  Height:   Ht Readings from Last 1 Encounters:  06/06/23 5\' 7"  (1.702 m)    Weight:   Wt Readings from Last 1 Encounters:  06/21/23 75.9 kg    Ideal Body Weight:  67 kg  BMI:  Body mass index is 26.21 kg/m.  Estimated Nutritional Needs:   Kcal:  1618kcal/day  Protein:  110-125g/day  Fluid:  1.7-2.0L/day  Betsey Holiday MS, RD, LDN Please refer to [facility]  for RD and/or RD on-call/weekend/after hours pager

## 2023-06-21 NOTE — TOC Progression Note (Signed)
Transition of Care [facility]) - Progression Note    Patient Details  Name: Maximino Tonelli MRN: [id number] Date of Birth: 28-Jan-1951  Transition of Care [facility]) CM/SW Contact  Allena Katz, LCSW Phone Number: 06/21/2023, 3:59 PM  Clinical Narrative:   Palliative care meeting scheduled for tomorrow. TOC following.         Expected Discharge Plan and Services                                               Social Determinants of Health (SDOH) Interventions SDOH Screenings   Food Insecurity: Food Insecurity Present (05/13/2023)  Housing: Low Risk  (05/06/2023)  Transportation Needs: No Transportation Needs (05/13/2023)  Utilities: Not At Risk (05/06/2023)  Alcohol Screen: Low Risk  (03/11/2022)  Depression (PHQ2-9): Low Risk  (02/24/2023)  Financial Resource Strain: Low Risk  (03/11/2022)  Physical Activity: Insufficiently Active (03/11/2022)  Social Connections: Socially Isolated (03/11/2022)  Stress: No Stress Concern Present (03/11/2022)  Tobacco Use: High Risk (06/15/2023)    Readmission Risk Interventions    05/10/2023   11:56 AM  Readmission Risk Prevention Plan  Transportation Screening Complete  PCP or Specialist Appt within 3-5 Days Complete  HRI or Home Care Consult Complete  Social Work Consult for Recovery Care Planning/Counseling Complete  Palliative Care Screening Complete  Medication Review [employer]) Complete

## 2023-06-21 NOTE — Progress Notes (Signed)
Updated pts daughters via telephone regarding pts condition and current plan of care.  Family meeting scheduled with Dr. Karna Christmas and Palliative Care tomorrow at 11:00 am.  All questions were answered.   Zada Girt, AGNP  Pulmonary/Critical Care Pager [phone] (please enter 7 digits) PCCM Consult Pager [phone] (please enter 7 digits)

## 2023-06-21 NOTE — Consult Note (Signed)
Consultation Note Date: 06/21/2023   Patient Name: Gilbert [name]  DOB: 28-Zacher-1952  MRN: [id number]  Age / Sex: 72 y.o., male  PCP: Shade Flood, MD Referring Physician: Vida Rigger, MD  Reason for Consultation: Establishing goals of care  HPI/Patient Profile: Per EDP note Gilbert [name] is a 72 y.o. male past medical history significant for diffuse large B cell lymphoma, CAD, hypertension, right renal cancer, seizure disorder on Dilantin who presents to the emergency department with altered mental status.   Clinical Assessment and Goals of Care: Notes and labs reviewed.  In to see patient.  He is currently resting in bed on ventilator support; no family at bedside.   Called to speak with patient's daughter Gilbert [name].  Gilbert [name] states she has a sister Gilbert [name].  She states Gilbert [name] and [name]'s son lives with the patient.  She states prior to around a month ago patient was self-sufficient and still able to play golf.  She discusses a previous admission.  She states he began to decline. She discusses concerns with his generic Dilantin, and then hydroxyzine for itching.  She advises around 9 days ago he had to begin using a wheelchair as he was too weak to walk.  She states he began to have hallucinations and slurred speech.  We discussed his diagnoses, prognosis, GOC, EOL wishes disposition and options.  Created space and opportunity for patient  to explore thoughts and feelings regarding current medical information.   A detailed discussion was had today regarding advanced directives.  Concepts specific to code status, artifical feeding and hydration, IV antibiotics and rehospitalization were discussed.  The difference between an aggressive medical intervention path and a comfort care path was discussed.  Values and goals of care important to patient and family were attempted to be elicited.  Discussed limitations of  medical interventions to prolong quality of life in some situations and discussed the concept of human mortality.  Gilbert [name] advises that quality of life is very important to them, and to the patient.  With conversation she states he is a man of faith and would be ready to die if he cannot have acceptable quality of life.  She states she does not believe that her father would want CPR or to be reintubated once extubated.  She states she believes her sister Gilbert [name] would feel the same, but needs to talk to her to be completely sure.  Questions answered regarding life-prolonging care and comfort focused care.     SUMMARY OF RECOMMENDATIONS    Gilbert [name] states she will need to speak with Gilbert [name] regarding CODE STATUS and extubation.  CCM NP updated.     Primary Diagnoses: Present on Admission:  Altered mental status  Shock circulatory (HCC)   I have reviewed the medical record, interviewed the patient and family, and examined the patient. The following aspects are pertinent.  Past Medical History:  Diagnosis Date   Abnormal liver enzymes    Chronic alkaline phosphatase elevation since 2014   Acid reflux    CAD (coronary artery disease)  Cancer [facility]) 08/2013   right kidney cancer   Cancer [facility])    Coronary artery calcification seen on CAT scan    Coronary atherosclerosis of native coronary artery    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 Used Impella Circulatory assist device. EF= 20-25%   History of epilepsy    at least 10 years ago. Grandmal seizures since childhood   History of myocardial infarction less than 8 weeks    Stent placed    History of nephrectomy 11/2013   For renal cell carcinoma   History of tobacco use    HTN (hypertension)    Hypercholesteremia    Hyperlipemia    Hypertension    PAD (peripheral artery disease) (HCC)    Seizures (HCC)    LAST SZ 15-20 YEARS AGO 03-30-2021   Shortness of breath    with activity   SOB (shortness of breath)    Systolic and diastolic CHF, chronic (HCC)    Resolved. EF 45% 04/10/2014   Therapeutic drug monitoring    Social History   Socioeconomic History   Marital status: Divorced    Spouse name: Not on file   Number of children: 2   Years of education: Not on file   Highest education level: Not on file  Occupational History   Occupation: retired  Tobacco Use   Smoking status: Former    Current packs/day: 0.00    Average packs/day: 2.0 packs/day for 45.0 years (90.0 ttl pk-yrs)    Types: Cigarettes    Start date: 08/02/1958    Quit date: 08/03/2003    Years since quitting: 19.8   Smokeless tobacco: Current    Types: Chew   Tobacco comments:    quit  smoking 10 years agop  Vaping Use   Vaping status: Never Used  Substance and Sexual Activity   Alcohol use: No    Alcohol/week: 0.0 standard drinks of alcohol    Comment: quit 1980   Drug use: No    Comment: none in past 30 years    Sexual activity: Not Currently  Other Topics Concern   Not on file  Social History Narrative   ** Merged History Encounter **       Social Determinants of Health   Financial Resource Strain: Low Risk  (03/11/2022)   Overall Financial Resource Strain (CARDIA)    Difficulty of Paying Living Expenses: Not hard at all  Food Insecurity: Food Insecurity Present (05/13/2023)   Hunger Vital Sign    Worried About Running Out of Food in the Last Year: Sometimes true    Ran Out of Food in the Last Year: Sometimes true  Transportation Needs: No Transportation Needs (05/13/2023)   PRAPARE - Administrator, Civil Service (Medical): No    Lack of Transportation  (Non-Medical): No  Physical Activity: Insufficiently Active (03/11/2022)   Exercise Vital Sign    Days of Exercise per Week: 3 days    Minutes of Exercise per Session: 30 min  Stress: No Stress Concern Present (03/11/2022)   [employer] of Occupational Health - Occupational Stress Questionnaire    Feeling of Stress :  Not at all  Social Connections: Socially Isolated (03/11/2022)   Social Connection and Isolation Panel [NHANES]    Frequency of Communication with Friends and Family: Three times a week    Frequency of Social Gatherings with Friends and Family: Three times a week    Attends Religious Services: Never    Active Member of Clubs or Organizations: No    Attends Banker Meetings: Never    Marital Status: Divorced   Family History  Problem Relation Age of Onset   Stroke Mother    Emphysema Father    Heart disease Brother        multiple stents   Hyperlipidemia Brother    Stomach cancer Maternal Grandmother    Colon cancer Neg Hx    Colon polyps Neg Hx    Esophageal cancer Neg Hx    Rectal cancer Neg Hx    Scheduled Meds:  Chlorhexidine Gluconate Cloth  6 each Topical Daily   clopidogrel  75 mg Per Tube Daily   feeding supplement (PROSource TF20)  60 mL Per Tube Daily   folic acid  1 mg Per Tube Daily   free water  30 mL Per Tube Q4H   heparin  5,000 Units Subcutaneous Q8H   insulin aspart  1-3 Units Subcutaneous Q4H   midazolam       mouth rinse  15 mL Mouth Rinse Q2H   pantoprazole (PROTONIX) IV  40 mg Intravenous Q24H   sodium chloride flush  10 mL Intravenous Q12H   thiamine  100 mg Per Tube Daily   triamcinolone cream   Topical BID   Continuous Infusions:  feeding supplement (VITAL AF 1.2 CAL) Stopped (06/21/23 0000)   levETIRAcetam Stopped (06/21/23 1011)   norepinephrine (LEVOPHED) Adult infusion Stopped (06/21/23 0957)   piperacillin-tazobactam (ZOSYN)  IV 3.375 g (06/21/23 1342)   propofol (DIPRIVAN) infusion 20 mcg/kg/min (06/21/23  1426)   PRN Meds:.docusate sodium, fentaNYL (SUBLIMAZE) injection, midazolam, mouth rinse, polyethylene glycol Medications Prior to Admission:  Prior to Admission medications   Medication Sig Start Date End Date Taking? Authorizing Provider  acetaminophen (TYLENOL) 500 MG tablet Take 1,000 mg by mouth daily.   Yes [provider]  APPLE CIDER VINEGAR PO Take 480 mg by mouth daily.   Yes [provider]  carvedilol (COREG) 3.125 MG tablet Take 1 tablet (3.125 mg total) by mouth 2 (two) times daily with a meal. 06/12/23 06/11/24 Yes Yates Decamp, MD  clopidogrel (PLAVIX) 75 MG tablet Take 1 tablet (75 mg total) by mouth daily. 10/06/22  Yes Yates Decamp, MD  Cyanocobalamin (B-12) 2500 MCG TABS Take 2,500 mcg by mouth every morning.   Yes [provider]  ezetimibe (ZETIA) 10 MG tablet Take 1 tablet (10 mg total) by mouth daily. 10/06/22  Yes Yates Decamp, MD  famotidine (PEPCID) 20 MG tablet Take 20 mg by mouth 2 (two) times daily.   Yes [provider]  hydrOXYzine (VISTARIL) 25 MG capsule Take 1 capsule (25 mg total) by mouth every 8 (eight) hours as needed. 06/02/23  Yes Shade Flood, MD  isosorbide mononitrate (IMDUR) 60 MG 24 hr tablet Take 1 tablet (60 mg total) by mouth daily. 10/06/22 10/01/23 Yes Yates Decamp, MD  nitrofurantoin, macrocrystal-monohydrate, (MACROBID) 100 MG capsule Take 100 mg by mouth every 12 (twelve) hours. 06/12/23  Yes [provider]  phenytoin (DILANTIN) 300 MG ER capsule Take 1 capsule (300 mg total) by mouth daily. 06/10/23  Yes Shade Flood, MD  pyridOXINE (VITAMIN B-6) 100 MG tablet Take 100 mg by mouth every morning.    Yes [provider]  rosuvastatin (CRESTOR) 40 MG tablet Take 1 tablet (40 mg total) by mouth daily. Patient taking differently: Take 40 mg by mouth daily. Cardiologist provider want it change to 10 mg 05/05/23 04/29/24 Yes Shade Flood, MD  Thiamine HCl (VITAMIN B-1) 250 MG tablet Take 250 mg by  mouth every morning.    Yes [provider]  triamcinolone cream (KENALOG) 0.1 % Apply 1 Application topically 2 (two) times daily as needed (rash). 06/06/23  Yes [provider]  doxycycline (VIBRA-TABS) 100 MG tablet Take 1 tablet (100 mg total) by mouth 2 (two) times daily. Patient not taking: Reported on 06/19/2023 06/02/23   Shade Flood, MD  permethrin (ELIMITE) 5 % cream Apply topically once. Patient not taking: Reported on 06/19/2023 06/02/23   [provider]  predniSONE (DELTASONE) 20 MG tablet Take 2 tablets (40 mg total) by mouth daily with breakfast. Patient not taking: Reported on 06/19/2023 06/02/23   Shade Flood, MD   Allergies  Allergen Reactions   Oxycodone Swelling and Other (See Comments)    Tongue and lips swell   Review of Systems  Unable to perform ROS   Physical Exam Constitutional:      Comments: Eyes closed  Pulmonary:     Comments: On ventilator    Vital Signs: BP (!) 143/60   Pulse 65   Temp 98.2 F (36.8 C) (Axillary)   Resp 20   Wt 75.9 kg   SpO2 97%   BMI 26.21 kg/m  Pain Scale: CPOT   Pain Score: 0-No pain   SpO2: SpO2: 97 % O2 Device:SpO2: 97 % O2 Flow Rate: .   IO: Intake/output summary:  Intake/Output Summary (Last 24 hours) at 06/21/2023 1437 Last data filed at 06/21/2023 1300 Gross per 24 hour  Intake 1368.69 ml  Output 1550 ml  Net -181.31 ml    LBM: Last BM Date : 06/20/23 Baseline Weight: Weight: 73.5 kg Most recent weight: Weight: 75.9 kg      Signed by: Morton Stall, NP   Please contact Palliative Medicine Team phone at [phone] for questions and concerns.  For individual provider: See Loretha Stapler

## 2023-06-21 NOTE — Procedures (Addendum)
PROCEDURE: BRONCHOSCOPY Therapeutic Aspiration of Tracheobronchial Tree and Flexible bronchoscopy with Bronchoalveolar lavage  PROCEDURE DATE: 06/21/2023  TIME:  NAMEHaddon Lanzo [name]  DOB:[birth year]  MRN: [id number] LOC:  IC05A/IC05A-AA    HOSP DAY: @LENGTHOFSTAYDAYS @ CODE STATUS:      Code Status Orders  (From admission, onward)           Start     Ordered   06/15/23 2347  Full code  Continuous       Question:  By:  Answer:  Consent: discussion documented in EHR   06/15/23 2347           Code Status History     Date Active Date Inactive Code Status Order ID Comments User Context   05/06/2023 0557 05/12/2023 1753 Full Code [id number]  Gery Pray, MD ED   03/23/2022 1753 03/24/2022 0052 Full Code [id number]  Yates Decamp, MD Inpatient   06/01/2017 2157 06/03/2017 1552 Full Code [id number]  Elder Negus, MD ED   04/07/2017 1930 04/08/2017 2005 Full Code [id number]  Hillary Bow, DO ED   06/10/2015 0808 06/10/2015 1456 Full Code [id number]  Yates Decamp, MD Inpatient   05/28/2014 1232 05/29/2014 1249 Full Code [id number]  Yates Decamp, MD Inpatient   02/26/2014 1615 02/27/2014 1328 Full Code [id number]  Yates Decamp, MD Inpatient   12/07/2013 1527 12/11/2013 1543 Full Code [id number]  Charlton Amor, PA-C Inpatient   11/27/2013 2022 12/07/2013 1527 Full Code [id number]  Yates Decamp, MD Inpatient   11/27/2013 0903 11/27/2013 2022 Full Code [id number]  Lonia Farber, MD Inpatient   11/27/2013 0255 11/27/2013 0903 Partial Code [id number]  Duayne Cal, NP Inpatient   11/27/2013 0050 11/27/2013 0255 Full Code [id number]  Kathlene Cote, PA-C ED   10/19/2013 1704 10/21/2013 1619 Full Code [id number]  Dwana Curd, MD Inpatient           Indications/Preliminary Diagnosis:   Consent: (Place X beside choice/s below)  The benefits, risks and possible complications of the procedure were        explained to:  ___ patient  __x_ patient's family  ___ other:___________  who verbalized  understanding and gave:  ___ verbal  __x_ written  ___ verbal and written  ___ telephone  ___ other:________ consent.      Unable to obtain consent; procedure performed on emergent basis.     Other:       PRESEDATION ASSESSMENT: History and Physical has been performed. Patient meds and allergies have been reviewed. Presedation airway examination has been performed and documented. Baseline vital signs, sedation score, oxygenation status, and cardiac rhythm were reviewed. Patient was deemed to be in satisfactory condition to undergo the procedure.    PREMEDICATIONS:   Sedative/Narcotic Amt Dose   Versed 2 mg   Fentanyl  mcg  Diprivan  mg            PROCEDURE DETAILS: Timeout performed and correct patient, name, & ID confirmed. Following prep per Pulmonary policy, appropriate sedation was administered. The Bronchoscope was inserted in to oral cavity with bite block in place. Therapeutic aspiration of Tracheobronchial tree was performed.  Airway exam proceeded with findings, technical procedures, and specimen collection as noted below. At the end of exam the scope was withdrawn without incident. Impression and Plan as noted below.           Airway Prep (Place X beside choice below)   1% Transtracheal Lidocaine Anesthetization 7 cc   Patient prepped per Bronchoscopy Lab Policy  Insertion Route (Place X beside choice below)   Nasal   Oral  x Endotracheal Tube   Tracheostomy   INTRAPROCEDURE MEDICATIONS:  Sedative/Narcotic Amt Dose   Versed  mg   Fentanyl  mcg  Diprivan gtt mg       Medication Amt Dose  Medication Amt Dose  Lidocaine 1%  cc  Epinephrine 1:10,000 sol  cc  Xylocaine 4%  cc  Cocaine  cc   TECHNICAL PROCEDURES: (Place X beside choice below)   Procedures  Description    None     Electrocautery     Cryotherapy     Balloon Dilatation     Bronchography     Stent Placement   x  Therapeutic Aspiration At RLL, LLL    Laser/Argon Plasma     Brachytherapy Catheter Placement    Foreign Body Removal         SPECIMENS (Sites): (Place X beside choice below)  Specimens Description   No Specimens Obtained     Washings   x Lavage At RLL   Biopsies    Fine Needle Aspirates    Brushings    Sputum    FINDINGS:  ESTIMATED BLOOD LOSS: none COMPLICATIONS/RESOLUTION: none      IMPRESSION:POST-PROCEDURE DX:   -severe mucus plugging of bilateral airways.   RECOMMENDATION/PLAN:    Wean from mechanical ventilator. Await microbiology from BAL     Vida Rigger, M.D.  Pulmonary & Critical Care Medicine  [facility]

## 2023-06-21 NOTE — Procedures (Addendum)
Endotracheal Intubation: Patient required ETT exchange due to a cuff leak    Consent: Emergent.    Hand washing performed prior to starting the procedure.    Medications administered for sedation prior to procedure:  20 mg Etomidate IV     A time out procedure was called and correct patient, name, & ID confirmed. Needed supplies and equipment were assembled and checked to include ETT, 10 ml syringe, Glidescope, Mac and Miller blades, suction, oxygen and bag mask valve, end tidal CO2 monitor.    Patient was positioned to align the mouth and pharynx to facilitate visualization of the glottis.    Heart rate, SpO2 and blood pressure was continuously monitored during the procedure. Pre-oxygenation was conducted prior to intubation and endotracheal tube was placed through the vocal cords into the trachea.       The artificial airway was placed under direct visualization via glidescope route using a 7.5 cm ETT on the first attempt.   ETT was secured at 24 cm.   Placement was confirmed by auscuitation of lungs with good breath sounds bilaterally and no stomach sounds.  Condensation was noted on endotracheal tube.   Pulse ox 100%  CO2 detector in place with appropriate color change.    Complications: None .        Chest radiograph ordered and pending.   Zada Girt, AGNP  Pulmonary/Critical Care Pager [phone] (please enter 7 digits) PCCM Consult Pager [phone] (please enter 7 digits)

## 2023-06-22 DIAGNOSIS — Z7189 Other specified counseling: Secondary | ICD-10-CM | POA: Diagnosis not present

## 2023-06-22 LAB — GLUCOSE, CAPILLARY
Glucose-Capillary: 174 mg/dL — ABNORMAL HIGH (ref 70–99)
Glucose-Capillary: 213 mg/dL — ABNORMAL HIGH (ref 70–99)
Glucose-Capillary: 277 mg/dL — ABNORMAL HIGH (ref 70–99)
Glucose-Capillary: 258 mg/dL — ABNORMAL HIGH (ref 70–99)
Glucose-Capillary: 221 mg/dL — ABNORMAL HIGH (ref 70–99)
Glucose-Capillary: 107 mg/dL — ABNORMAL HIGH (ref 70–99)

## 2023-06-22 LAB — BASIC METABOLIC PANEL
Anion gap: 4 — ABNORMAL LOW (ref 5–15)
BUN: 50 mg/dL — ABNORMAL HIGH (ref 8–23)
CO2: 20 mmol/L — ABNORMAL LOW (ref 22–32)
Calcium: 7.8 mg/dL — ABNORMAL LOW (ref 8.9–10.3)
Chloride: 121 mmol/L — ABNORMAL HIGH (ref 98–111)
Creatinine, Ser: 1.58 mg/dL — ABNORMAL HIGH (ref 0.61–1.24)
GFR, Estimated: 46 mL/min — ABNORMAL LOW (ref >60)
Glucose, Bld: 228 mg/dL — ABNORMAL HIGH (ref 70–99)
Potassium: 3.6 mmol/L (ref 3.5–5.1)
Sodium: 145 mmol/L (ref 135–145)

## 2023-06-22 LAB — CBC
HCT: 26.1 % — ABNORMAL LOW (ref 39.0–52.0)
Hemoglobin: 8.1 g/dL — ABNORMAL LOW (ref 13.0–17.0)
MCH: 28.8 pg (ref 26.0–34.0)
MCHC: 31 g/dL (ref 30.0–36.0)
MCV: 92.9 fL (ref 80.0–100.0)
Platelets: 101 10*3/uL — ABNORMAL LOW (ref 150–400)
RBC: 2.81 MIL/uL — ABNORMAL LOW (ref 4.22–5.81)
RDW: 18.6 % — ABNORMAL HIGH (ref 11.5–15.5)
WBC: 8.9 10*3/uL (ref 4.0–10.5)
nRBC: 0.5 % — ABNORMAL HIGH (ref 0.0–0.2)

## 2023-06-22 LAB — HEMOGLOBIN A1C
Hgb A1c MFr Bld: 4.8 % (ref 4.8–5.6)
Mean Plasma Glucose: 91.06 mg/dL

## 2023-06-22 LAB — MAGNESIUM: Magnesium: 1.8 mg/dL (ref 1.7–2.4)

## 2023-06-22 LAB — TRIGLYCERIDES: Triglycerides: 274 mg/dL — ABNORMAL HIGH (ref <150)

## 2023-06-22 LAB — PHOSPHORUS: Phosphorus: 3.3 mg/dL (ref 2.5–4.6)

## 2023-06-22 MED ORDER — ENOXAPARIN SODIUM 40 MG/0.4ML IJ SOSY
40.0000 mg | PREFILLED_SYRINGE | Freq: Every day | INTRAMUSCULAR | Status: DC
  Administered 2023-06-22 – 2023-06-24 (×3): 40 mg via SUBCUTANEOUS
  Filled 2023-06-22 (×3): qty 0.4

## 2023-06-22 MED ORDER — MAGNESIUM SULFATE 2 GM/50ML IV SOLN
2.0000 g | Freq: Once | INTRAVENOUS | Status: AC
  Administered 2023-06-22: 2 g via INTRAVENOUS
  Filled 2023-06-22: qty 50

## 2023-06-22 MED ORDER — INSULIN ASPART 100 UNIT/ML IJ SOLN
0.0000 [IU] | INTRAMUSCULAR | Status: DC
  Administered 2023-06-22: 8 [IU] via SUBCUTANEOUS
  Administered 2023-06-22: 5 [IU] via SUBCUTANEOUS
  Administered 2023-06-22: 8 [IU] via SUBCUTANEOUS
  Administered 2023-06-23 (×3): 3 [IU] via SUBCUTANEOUS
  Administered 2023-06-23: 5 [IU] via SUBCUTANEOUS
  Administered 2023-06-23: 3 [IU] via SUBCUTANEOUS
  Administered 2023-06-23: 2 [IU] via SUBCUTANEOUS
  Administered 2023-06-24 (×3): 5 [IU] via SUBCUTANEOUS
  Filled 2023-06-22 (×12): qty 1

## 2023-06-22 MED ORDER — FUROSEMIDE 10 MG/ML IJ SOLN
40.0000 mg | Freq: Every day | INTRAMUSCULAR | Status: DC
  Administered 2023-06-22 – 2023-06-24 (×3): 40 mg via INTRAVENOUS
  Filled 2023-06-22 (×3): qty 4

## 2023-06-22 NOTE — Progress Notes (Signed)
Daily Progress Note   Patient Name: Gilbert [name]       Date: 06/22/2023 DOB: 1950/11/13  Age: 72 y.o. MRN#: [id number] Attending Physician: Vida Rigger, MD Primary Care Physician: Shade Flood, MD Admit Date: 06/15/2023  Reason for Consultation/Follow-up: Establishing goals of care  Subjective: Notes and labs reviewed.  Into see patient.  He is currently resting in bed with ventilator in place.  Daughter [name] is at bedside, and discusses patient's status.  [name] discusses that she spoke with her sister [name].  She discusses importance of quality of life.  She states she would like for her father to remain intubated for few more days to medically optimize prior to extubation.  She states they will address CODE STATUS at the time of extubation.   Length of Stay: 7  Current Medications: Scheduled Meds:   Chlorhexidine Gluconate Cloth  6 each Topical Daily   clopidogrel  75 mg Per Tube Daily   enoxaparin (LOVENOX) injection  40 mg Subcutaneous QHS   feeding supplement (PROSource TF20)  60 mL Per Tube Daily   folic acid  1 mg Per Tube Daily   free water  30 mL Per Tube Q4H   furosemide  40 mg Intravenous Daily   insulin aspart  1-3 Units Subcutaneous Q4H   mouth rinse  15 mL Mouth Rinse Q2H   pantoprazole (PROTONIX) IV  40 mg Intravenous Q24H   sodium chloride flush  10 mL Intravenous Q12H   thiamine  100 mg Per Tube Daily   triamcinolone cream   Topical BID    Continuous Infusions:  feeding supplement (VITAL AF 1.2 CAL) 55 mL/hr at 06/22/23 0545   levETIRAcetam 500 mg (06/22/23 0920)   norepinephrine (LEVOPHED) Adult infusion Stopped (06/22/23 0202)   piperacillin-tazobactam (ZOSYN)  IV 12.5 mL/hr at 06/22/23 0545   propofol (DIPRIVAN) infusion Stopped (06/22/23 0845)     PRN Meds: docusate sodium, fentaNYL (SUBLIMAZE) injection, mouth rinse, polyethylene glycol  Physical Exam Constitutional:      Comments: Patient resting in bed on ventilator support.  Mitten restraints in place.             Vital Signs: BP 139/69   Pulse 88   Temp 97.9 F (36.6 C) (Axillary)   Resp (!) 33   Ht 5' 7.01" (1.702 m)  Wt 76 kg   SpO2 100%   BMI 26.24 kg/m  SpO2: SpO2: 100 % O2 Device: O2 Device: Ventilator O2 Flow Rate:    Intake/output summary:  Intake/Output Summary (Last 24 hours) at 06/22/2023 1202 Last data filed at 06/22/2023 0545 Gross per 24 hour  Intake 1138.95 ml  Output 625 ml  Net 513.95 ml   LBM: Last BM Date : 06/21/23 Baseline Weight: Weight: 73.5 kg Most recent weight: Weight: 76 kg  Patient Active Problem List   Diagnosis Date Noted   Altered mental status 06/15/2023   Malnutrition of moderate degree 05/09/2023   Hyperkalemia 05/06/2023   Metabolic acidosis 05/06/2023   Acute systolic heart failure (HCC)    Mixed hyperlipidemia 12/24/2019   Chronic systolic heart failure (HCC) 03/23/2019   CHF (congestive heart failure) (HCC) 06/01/2017   Acute kidney injury superimposed on chronic kidney disease (HCC) 04/07/2017   Orthostatic hypotension 04/07/2017   Diffuse non-Hodgkin's lymphoma of bone (HCC) 01/08/2016   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/07/2016   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   Hematuria 10/29/2015   Dysphagia, pharyngoesophageal phase    Early satiety    Esophageal stricture    Gastritis and gastroduodenitis    PAD (peripheral artery disease) (HCC) 05/28/2014   Ischemic cardiomyopathy 04/03/2014   History of left heart catheterization (LHC) 02/26/2014   Arterial bleed, intraoperative 02/26/2014   Seizures (HCC) 11/27/2013   Essential hypertension 11/27/2013   Shock circulatory (HCC) 11/27/2013   Hypomagnesemia 11/27/2013   Coronary artery disease involving native coronary artery of  native heart without angina pectoris 11/26/2013   Renal cancer (HCC) 10/19/2013   Coronary artery calcification 09/25/2013   Renal cyst 08/30/2013   Loss of weight 08/03/2013    Palliative Care Assessment & Plan   Recommendations/Plan: Daughter would like patient intubated a few more days to medically optimize prior to extubation.  She advises they will discuss CODE STATUS at the time of extubation.  Code Status:    Code Status Orders  (From admission, onward)           Start     Ordered   06/15/23 2347  Full code  Continuous       Question:  By:  Answer:  Consent: discussion documented in EHR   06/15/23 2347           Code Status History     Date Active Date Inactive Code Status Order ID Comments User Context   05/06/2023 0557 05/12/2023 1753 Full Code [id number]  Gery Pray, MD ED   03/23/2022 1753 03/24/2022 0052 Full Code [id number]  Yates Decamp, MD Inpatient   06/01/2017 2157 06/03/2017 1552 Full Code [id number]  Elder Negus, MD ED   04/07/2017 1930 04/08/2017 2005 Full Code [id number]  Hillary Bow, DO ED   06/10/2015 0808 06/10/2015 1456 Full Code [id number]  Yates Decamp, MD Inpatient   05/28/2014 1232 05/29/2014 1249 Full Code [id number]  Yates Decamp, MD Inpatient   02/26/2014 1615 02/27/2014 1328 Full Code [id number]  Yates Decamp, MD Inpatient   12/07/2013 1527 12/11/2013 1543 Full Code [id number]  Charlton Amor, PA-C Inpatient   11/27/2013 2022 12/07/2013 1527 Full Code [id number]  Yates Decamp, MD Inpatient   11/27/2013 0903 11/27/2013 2022 Full Code [id number]  Lonia Farber, MD Inpatient   11/27/2013 0255 11/27/2013 0903 Partial Code [id number]  Duayne Cal, NP Inpatient   11/27/2013 0050 11/27/2013 0255 Full Code [id number]  Kathlene Cote, PA-C ED   10/19/2013 1704 10/21/2013  1619 Full Code [id number]  Dwana Curd, MD Inpatient       Care plan was discussed with CCM NP  Thank you for allowing the Palliative Medicine Team to assist in the care of this  patient.     Morton Stall, NP  Please contact Palliative Medicine Team phone at [phone] for questions and concerns.

## 2023-06-22 NOTE — Consult Note (Signed)
PHARMACY CONSULT NOTE - ELECTROLYTES  Pharmacy Consult for Electrolyte Monitoring and Replacement   Recent Labs: Height: 5' 7.01" (170.2 cm) Weight: 76 kg (167 lb 8.8 oz) IBW/kg (Calculated) : 66.12 Estimated Creatinine Clearance: 39.5 mL/min (A) (by C-G formula based on SCr of 1.58 mg/dL (H)).  Potassium  Date Value  06/22/2023 3.6 mmol/L  04/20/2017 3.6 mEq/L   Magnesium (mg/dL)  Date Value  69/62/9528 1.8   Calcium (mg/dL)  Date Value  41/32/4401 7.8 (L)  04/20/2017 8.8   Albumin (g/dL)  Date Value  02/72/5366 1.5 (L)  04/20/2017 3.0 (L)   Phosphorus (mg/dL)  Date Value  44/09/4740 3.3   Sodium  Date Value  06/22/2023 145 mmol/L  12/10/2021 142 mmol/L  04/20/2017 140 mEq/L   Assessment  Gilbert [name] is a 72 y.o. male presenting with possible pancreatitis and duodenal diverticulitis. 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 Pharmacy has been consulted to monitor and replace electrolytes.  Goal of Therapy: Electrolytes WNL  Plan:  --Mg 1.8, magnesium sulfate 2 g IV x 1 --Follow-up electrolytes with AM labs tomorrow  Thank you for allowing pharmacy to be a part of this patient's care.  Tressie Ellis 06/22/2023 7:55 AM
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
## 2023-06-22 NOTE — IPAL (Signed)
  Interdisciplinary Goals of Care Family Meeting   Date carried out: 06/22/2023  Location of the meeting: Bedside  Member's involved: Physician, Nurse Practitioner, Bedside Registered Nurse, and Family Member or next of kin  Durable Power of Attorney or acting medical decision maker: pt's 2 daughters    Discussion: We discussed goals of care for [facility] .  Discussed that their father remains critically ill on ventilator, treating for Pseudomonas Pneumonia and HFrEF.   Mentation, vent requirements, and secretions have improved.  Pt able to follow simple commands, currently in SBT 10/5, RSBI score is ok, but RR in 30's, not ready for extubation just yet.    They report that they would like to continue to current measures to try and optimize patient for best chance of successful 1-way extubation in the coming days.  They state that quality of life is extremely important to the patient,  and if he cannot have acceptable quality of life, they do not believe that their father would want CPR or to be reintubated once extubated.   But will continue with current measures in attempts to optimize liberation from mechanical ventilation including ABX and diuresis.  Code status:   Code Status: Full Code   Disposition: Continue current acute care  Time spent for the meeting: 20 minutes   Harlon Ditty, AGACNP-BC [facility] Prefer epic messenger for cross cover needs If after hours, please call E-link  Judithe Modest, NP  06/22/2023, 12:11 PM

## 2023-06-23 ENCOUNTER — Telehealth: Payer: Self-pay | Admitting: *Deleted

## 2023-06-23 ENCOUNTER — Inpatient Hospital Stay: Payer: Medicare Other

## 2023-06-23 DIAGNOSIS — Z7189 Other specified counseling: Secondary | ICD-10-CM | POA: Diagnosis not present

## 2023-06-23 LAB — CBC
HCT: 22.2 % — ABNORMAL LOW (ref 39.0–52.0)
HCT: 21.9 % — ABNORMAL LOW (ref 39.0–52.0)
Hemoglobin: 6.8 g/dL — ABNORMAL LOW (ref 13.0–17.0)
Hemoglobin: 6.6 g/dL — ABNORMAL LOW (ref 13.0–17.0)
MCH: 29.2 pg (ref 26.0–34.0)
MCH: 29.1 pg (ref 26.0–34.0)
MCHC: 30.6 g/dL (ref 30.0–36.0)
MCHC: 30.1 g/dL (ref 30.0–36.0)
MCV: 95.3 fL (ref 80.0–100.0)
MCV: 96.5 fL (ref 80.0–100.0)
Platelets: 110 10*3/uL — ABNORMAL LOW (ref 150–400)
Platelets: 117 10*3/uL — ABNORMAL LOW (ref 150–400)
RBC: 2.33 MIL/uL — ABNORMAL LOW (ref 4.22–5.81)
RBC: 2.27 MIL/uL — ABNORMAL LOW (ref 4.22–5.81)
RDW: 18.9 % — ABNORMAL HIGH (ref 11.5–15.5)
RDW: 19.2 % — ABNORMAL HIGH (ref 11.5–15.5)
WBC: 9.4 10*3/uL (ref 4.0–10.5)
WBC: 9.2 10*3/uL (ref 4.0–10.5)
nRBC: 1 % — ABNORMAL HIGH (ref 0.0–0.2)
nRBC: 1.3 % — ABNORMAL HIGH (ref 0.0–0.2)

## 2023-06-23 LAB — BASIC METABOLIC PANEL
Anion gap: 9 (ref 5–15)
BUN: 55 mg/dL — ABNORMAL HIGH (ref 8–23)
CO2: 20 mmol/L — ABNORMAL LOW (ref 22–32)
Calcium: 7.9 mg/dL — ABNORMAL LOW (ref 8.9–10.3)
Chloride: 119 mmol/L — ABNORMAL HIGH (ref 98–111)
Creatinine, Ser: 1.65 mg/dL — ABNORMAL HIGH (ref 0.61–1.24)
GFR, Estimated: 44 mL/min — ABNORMAL LOW (ref >60)
Glucose, Bld: 243 mg/dL — ABNORMAL HIGH (ref 70–99)
Potassium: 3.5 mmol/L (ref 3.5–5.1)
Sodium: 148 mmol/L — ABNORMAL HIGH (ref 135–145)

## 2023-06-23 LAB — PREPARE RBC (CROSSMATCH)

## 2023-06-23 LAB — CULTURE, BLOOD (ROUTINE X 2)
Culture: NO GROWTH
Special Requests: ADEQUATE

## 2023-06-23 LAB — CULTURE, BAL-QUANTITATIVE W GRAM STAIN: Culture: 40000 — AB

## 2023-06-23 LAB — HEMOGLOBIN AND HEMATOCRIT, BLOOD
HCT: 26.9 % — ABNORMAL LOW (ref 39.0–52.0)
Hemoglobin: 8.4 g/dL — ABNORMAL LOW (ref 13.0–17.0)

## 2023-06-23 LAB — PHOSPHORUS: Phosphorus: 3.4 mg/dL (ref 2.5–4.6)

## 2023-06-23 LAB — GLUCOSE, CAPILLARY
Glucose-Capillary: 200 mg/dL — ABNORMAL HIGH (ref 70–99)
Glucose-Capillary: 233 mg/dL — ABNORMAL HIGH (ref 70–99)
Glucose-Capillary: 166 mg/dL — ABNORMAL HIGH (ref 70–99)
Glucose-Capillary: 166 mg/dL — ABNORMAL HIGH (ref 70–99)
Glucose-Capillary: 173 mg/dL — ABNORMAL HIGH (ref 70–99)
Glucose-Capillary: 141 mg/dL — ABNORMAL HIGH (ref 70–99)

## 2023-06-23 LAB — PHENYTOIN LEVEL, TOTAL: Phenytoin Lvl: 8.1 ug/mL — ABNORMAL LOW (ref 10.0–20.0)

## 2023-06-23 LAB — MAGNESIUM: Magnesium: 2.1 mg/dL (ref 1.7–2.4)

## 2023-06-23 MED ORDER — GLYCOPYRROLATE 0.2 MG/ML IJ SOLN
0.2000 mg | Freq: Once | INTRAMUSCULAR | Status: AC
  Administered 2023-06-23: 0.2 mg via INTRAVENOUS
  Filled 2023-06-23: qty 1

## 2023-06-23 MED ORDER — FREE WATER
150.0000 mL | Status: DC
  Administered 2023-06-23 – 2023-06-24 (×6): 150 mL

## 2023-06-23 MED ORDER — SODIUM CHLORIDE 0.9% IV SOLUTION: Freq: Once | INTRAVENOUS | Status: AC

## 2023-06-23 MED ORDER — POTASSIUM CHLORIDE 20 MEQ PO PACK
40.0000 meq | PACK | Freq: Once | ORAL | Status: AC
  Administered 2023-06-23: 40 meq
  Filled 2023-06-23: qty 2
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
## 2023-06-23 NOTE — Inpatient Diabetes Management (Signed)
Inpatient Diabetes Program Recommendations  AACE/ADA: New Consensus Statement on Inpatient Glycemic Control (2015)  Target Ranges:  Prepandial:   less than 140 mg/dL      Peak postprandial:   less than 180 mg/dL (1-2 hours)      Critically ill patients:  140 - 180 mg/dL    Latest Reference Range & Units 06/21/23 23:10 06/22/23 03:22 06/22/23 07:32 06/22/23 11:57 06/22/23 17:07 06/22/23 19:12  Glucose-Capillary 70 - 99 mg/dL 161 (H) 096 (H) 045 (H) 277 (H) 258 (H) 221 (H)  (H): Data is abnormally high  Latest Reference Range & Units 06/22/23 23:19 06/23/23 03:17 06/23/23 07:21  Glucose-Capillary 70 - 99 mg/dL 409 (H) 811 (H) 914 (H)  (H): Data is abnormally high    Current Orders: Novolog Moderate Correction Scale/ SSI (0-15 units) Q4 hours    MD- Note pt getting Tube feeds 55cc/hr.  If tube feeds are continued today, [place] consider adding Novolog Tube Feed Coverage if within goals of care:  Novolog 3 units Q4 hours HOLD if tube feeds HELD for any reason    --Will follow patient during hospitalization--  Ambrose Finland RN, MSN, CDCES Diabetes Coordinator Inpatient Glycemic Control Team Team Pager: [phone] (8a-5p)

## 2023-06-23 NOTE — Telephone Encounter (Signed)
Received a call from [name] patients daughter stating that her dad was in [facility] on a ventilator with pneumonia and other serious medical problems.  Wanted Dr Myna Hidalgo to be aware.  Appts cancelled and [name] will let us know if need to be rescheduled.  Dr Myna Hidalgo made aware.

## 2023-06-23 NOTE — Consult Note (Signed)
PHARMACY CONSULT NOTE - ELECTROLYTES  Pharmacy Consult for Electrolyte Monitoring and Replacement   Recent Labs: Height: 5' 7.01" (170.2 cm) Weight: 73.6 kg (162 lb 4.1 oz) IBW/kg (Calculated) : 66.12 Estimated Creatinine Clearance: 37.8 mL/min (A) (by C-G formula based on SCr of 1.65 mg/dL (H)).  Potassium  Date Value  06/23/2023 3.5 mmol/L  04/20/2017 3.6 mEq/L   Magnesium (mg/dL)  Date Value  03/47/4259 2.1   Calcium (mg/dL)  Date Value  56/38/7564 7.9 (L)  04/20/2017 8.8   Albumin (g/dL)  Date Value  33/29/5188 1.5 (L)  04/20/2017 3.0 (L)   Phosphorus (mg/dL)  Date Value  41/66/0630 3.4   Sodium  Date Value  06/23/2023 148 mmol/L (H)  12/10/2021 142 mmol/L  04/20/2017 140 mEq/L   Assessment  Gilbert [name] is a 72 y.o. male presenting with possible pancreatitis and duodenal diverticulitis. 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 Pharmacy has been consulted to monitor and replace electrolytes.  Diuresis: IV Lasix 40 mg daily  Goal of Therapy: Electrolytes WNL  Plan:  --Na 148, secondary to diuresis yesterday --K 3.5, Kcl 40 mEq per tube x 1 --Follow-up electrolytes with AM labs tomorrow  Thank you for allowing pharmacy to be a part of this patient's care.  Tressie Ellis 06/23/2023 8:17 AM

## 2023-06-23 NOTE — Progress Notes (Signed)
Daily Progress Note   Patient Name: Gilbert [name]       Date: 06/23/2023 DOB: 1951-04-17  Age: 72 y.o. MRN#: [id number] Attending Physician: Vida Rigger, MD Primary Care Physician: Shade Flood, MD Admit Date: 06/15/2023  Reason for Consultation/Follow-up: Establishing goals of care  Subjective: Notes and labs reviewed.  In to see patient. He is currently resting in bed with no family at bedside. Staff advises he did poorly with SBT today and had copious secretions for which he received Robinul. Per staff, daughters were present. PMT will follow up tomorrow.      Length of Stay: 8  Current Medications: Scheduled Meds:   Chlorhexidine Gluconate Cloth  6 each Topical Daily   clopidogrel  75 mg Per Tube Daily   enoxaparin (LOVENOX) injection  40 mg Subcutaneous QHS   feeding supplement (PROSource TF20)  60 mL Per Tube Daily   folic acid  1 mg Per Tube Daily   free water  150 mL Per Tube Q4H   furosemide  40 mg Intravenous Daily   insulin aspart  0-15 Units Subcutaneous Q4H   mouth rinse  15 mL Mouth Rinse Q2H   pantoprazole (PROTONIX) IV  40 mg Intravenous Q24H   sodium chloride flush  10 mL Intravenous Q12H   thiamine  100 mg Per Tube Daily   triamcinolone cream   Topical BID    Continuous Infusions:  feeding supplement (VITAL AF 1.2 CAL) 1,000 mL (06/23/23 1200)   levETIRAcetam 500 mg (06/23/23 0902)   norepinephrine (LEVOPHED) Adult infusion Stopped (06/22/23 0202)   piperacillin-tazobactam (ZOSYN)  IV 3.375 g (06/23/23 1420)   propofol (DIPRIVAN) infusion 25 mcg/kg/min (06/23/23 1304)    PRN Meds: docusate sodium, fentaNYL (SUBLIMAZE) injection, mouth rinse, polyethylene glycol  Physical Exam Constitutional:      Comments: Eyes closed.   Pulmonary:      Comments: On ventilator.             Vital Signs: BP (!) 128/55   Pulse 81   Temp 97.7 F (36.5 C) (Axillary)   Resp (!) 25   Ht 5' 7.01" (1.702 m)   Wt 73.6 kg   SpO2 99%   BMI 25.41 kg/m  SpO2: SpO2: 99 % O2 Device: O2 Device: Ventilator O2 Flow Rate:    Intake/output summary:  Intake/Output  Summary (Last 24 hours) at 06/23/2023 1449 Last data filed at 06/23/2023 1420 Gross per 24 hour  Intake 2165.88 ml  Output 1100 ml  Net 1065.88 ml   LBM: Last BM Date : 06/21/23 Baseline Weight: Weight: 73.5 kg Most recent weight: Weight: 73.6 kg        Patient Active Problem List   Diagnosis Date Noted   Altered mental status 06/15/2023   Malnutrition of moderate degree 05/09/2023   Hyperkalemia 05/06/2023   Metabolic acidosis 05/06/2023   Acute systolic heart failure (HCC)    Mixed hyperlipidemia 12/24/2019   Chronic systolic heart failure (HCC) 03/23/2019   CHF (congestive heart failure) (HCC) 06/01/2017   Acute kidney injury superimposed on chronic kidney disease (HCC) 04/07/2017   Orthostatic hypotension 04/07/2017   Diffuse non-Hodgkin's lymphoma of bone (HCC) 01/08/2016   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/07/2016   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   Hematuria 10/29/2015   Dysphagia, pharyngoesophageal phase    Early satiety    Esophageal stricture    Gastritis and gastroduodenitis    PAD (peripheral artery disease) (HCC) 05/28/2014   Ischemic cardiomyopathy 04/03/2014   History of left heart catheterization (LHC) 02/26/2014   Arterial bleed, intraoperative 02/26/2014   Seizures (HCC) 11/27/2013   Essential hypertension 11/27/2013   Shock circulatory (HCC) 11/27/2013   Hypomagnesemia 11/27/2013   Coronary artery disease involving native coronary artery of native heart without angina pectoris 11/26/2013   Renal cancer (HCC) 10/19/2013   Coronary artery calcification 09/25/2013   Renal cyst 08/30/2013   Loss of weight 08/03/2013     Palliative Care Assessment & Plan    Recommendations/Plan: PMT will follow.   Code Status:    Code Status Orders  (From admission, onward)           Start     Ordered   06/15/23 2347  Full code  Continuous       Question:  By:  Answer:  Consent: discussion documented in EHR   06/15/23 2347           Code Status History     Date Active Date Inactive Code Status Order ID Comments User Context   05/06/2023 0557 05/12/2023 1753 Full Code [id number]  Gery Pray, MD ED   03/23/2022 1753 03/24/2022 0052 Full Code [id number]  Yates Decamp, MD Inpatient   06/01/2017 2157 06/03/2017 1552 Full Code [id number]  Elder Negus, MD ED   04/07/2017 1930 04/08/2017 2005 Full Code [id number]  Hillary Bow, DO ED   06/10/2015 0808 06/10/2015 1456 Full Code [id number]  Yates Decamp, MD Inpatient   05/28/2014 1232 05/29/2014 1249 Full Code [id number]  Yates Decamp, MD Inpatient   02/26/2014 1615 02/27/2014 1328 Full Code [id number]  Yates Decamp, MD Inpatient   12/07/2013 1527 12/11/2013 1543 Full Code [id number]  Charlton Amor, PA-C Inpatient   11/27/2013 2022 12/07/2013 1527 Full Code [id number]  Yates Decamp, MD Inpatient   11/27/2013 0903 11/27/2013 2022 Full Code [id number]  Lonia Farber, MD Inpatient   11/27/2013 0255 11/27/2013 0903 Partial Code [id number]  Duayne Cal, NP Inpatient   11/27/2013 0050 11/27/2013 0255 Full Code [id number]  Kathlene Cote, PA-C ED   10/19/2013 1704 10/21/2013 1619 Full Code [id number]  Dwana Curd, MD Inpatient       Prognosis: Poor  Care plan was discussed with nurse  Thank you for allowing the Palliative Medicine Team to assist in the care of this patient.   Morton Stall, NP  Please contact  Palliative Medicine Team phone at [phone] for questions and concerns.

## 2023-06-24 DIAGNOSIS — Z7189 Other specified counseling: Secondary | ICD-10-CM | POA: Diagnosis not present

## 2023-06-24 LAB — BLOOD GAS, VENOUS
Acid-base deficit: 4.4 mmol/L — ABNORMAL HIGH (ref 0.0–2.0)
Bicarbonate: 22.1 mmol/L (ref 20.0–28.0)
O2 Saturation: 78.5 %
Patient temperature: 37
pCO2, Ven: 45 mm[Hg] (ref 44–60)
pH, Ven: 7.3 (ref 7.25–7.43)
pO2, Ven: 49 mm[Hg] — ABNORMAL HIGH (ref 32–45)

## 2023-06-24 LAB — TYPE AND SCREEN
ABO/RH(D): A POS
Antibody Screen: NEGATIVE
Unit division: 0

## 2023-06-24 LAB — CBC
HCT: 25.9 % — ABNORMAL LOW (ref 39.0–52.0)
Hemoglobin: 8 g/dL — ABNORMAL LOW (ref 13.0–17.0)
MCH: 29.4 pg (ref 26.0–34.0)
MCHC: 30.9 g/dL (ref 30.0–36.0)
MCV: 95.2 fL (ref 80.0–100.0)
Platelets: 121 10*3/uL — ABNORMAL LOW (ref 150–400)
RBC: 2.72 MIL/uL — ABNORMAL LOW (ref 4.22–5.81)
RDW: 18.7 % — ABNORMAL HIGH (ref 11.5–15.5)
WBC: 9.1 10*3/uL (ref 4.0–10.5)
nRBC: 0.8 % — ABNORMAL HIGH (ref 0.0–0.2)

## 2023-06-24 LAB — BASIC METABOLIC PANEL
Anion gap: 8 (ref 5–15)
BUN: 58 mg/dL — ABNORMAL HIGH (ref 8–23)
CO2: 23 mmol/L (ref 22–32)
Calcium: 8 mg/dL — ABNORMAL LOW (ref 8.9–10.3)
Chloride: 117 mmol/L — ABNORMAL HIGH (ref 98–111)
Creatinine, Ser: 1.42 mg/dL — ABNORMAL HIGH (ref 0.61–1.24)
GFR, Estimated: 53 mL/min — ABNORMAL LOW (ref >60)
Glucose, Bld: 257 mg/dL — ABNORMAL HIGH (ref 70–99)
Potassium: 3.8 mmol/L (ref 3.5–5.1)
Sodium: 148 mmol/L — ABNORMAL HIGH (ref 135–145)

## 2023-06-24 LAB — BPAM RBC
Blood Product Expiration Date: 202412202359
ISSUE DATE / TIME: 202411210703
Unit Type and Rh: 6200

## 2023-06-24 LAB — BLOOD GAS, ARTERIAL
Acid-base deficit: 1.6 mmol/L (ref 0.0–2.0)
Bicarbonate: 21.5 mmol/L (ref 20.0–28.0)
Delivery systems: POSITIVE
Expiratory PAP: 5 cm[H2O]
FIO2: 21 %
Inspiratory PAP: 10 cm[H2O]
O2 Saturation: 95.4 %
Patient temperature: 37
pCO2 arterial: 31 mm[Hg] — ABNORMAL LOW (ref 32–48)
pH, Arterial: 7.45 (ref 7.35–7.45)
pO2, Arterial: 61 mm[Hg] — ABNORMAL LOW (ref 83–108)

## 2023-06-24 LAB — MAGNESIUM: Magnesium: 1.9 mg/dL (ref 1.7–2.4)

## 2023-06-24 LAB — CULTURE, BLOOD (ROUTINE X 2): Culture: NO GROWTH

## 2023-06-24 LAB — GLUCOSE, CAPILLARY
Glucose-Capillary: 210 mg/dL — ABNORMAL HIGH (ref 70–99)
Glucose-Capillary: 215 mg/dL — ABNORMAL HIGH (ref 70–99)
Glucose-Capillary: 103 mg/dL — ABNORMAL HIGH (ref 70–99)
Glucose-Capillary: 75 mg/dL (ref 70–99)
Glucose-Capillary: 104 mg/dL — ABNORMAL HIGH (ref 70–99)

## 2023-06-24 LAB — PHOSPHORUS: Phosphorus: 3.6 mg/dL (ref 2.5–4.6)

## 2023-06-24 MED ORDER — FUROSEMIDE 10 MG/ML IJ SOLN: 20.0000 mg | Freq: Once | INTRAMUSCULAR | Status: AC

## 2023-06-24 MED ORDER — ORAL CARE MOUTH RINSE
15.0000 mL | OROMUCOSAL | Status: DC
  Administered 2023-06-24: 15 mL via OROMUCOSAL

## 2023-06-24 MED ORDER — DEXMEDETOMIDINE HCL IN NACL 400 MCG/100ML IV SOLN: 0.0000 ug/kg/h | INTRAVENOUS | Status: DC

## 2023-06-24 MED ORDER — FUROSEMIDE 10 MG/ML IJ SOLN
INTRAMUSCULAR | Status: AC
  Administered 2023-06-24: 20 mg via INTRAVENOUS
  Filled 2023-06-24: qty 2

## 2023-06-24 MED ORDER — GLYCOPYRROLATE 0.2 MG/ML IJ SOLN: 0.2000 mg | INTRAMUSCULAR | Status: DC | PRN

## 2023-06-24 MED ORDER — METOPROLOL TARTRATE 5 MG/5ML IV SOLN: 5.0000 mg | Freq: Four times a day (QID) | INTRAVENOUS | Status: DC

## 2023-06-24 MED ORDER — METOPROLOL TARTRATE 5 MG/5ML IV SOLN: 5.0000 mg | Freq: Once | INTRAVENOUS | Status: AC

## 2023-06-24 MED ORDER — POLYVINYL ALCOHOL 1.4 % OP SOLN: 1.0000 [drp] | Freq: Four times a day (QID) | OPHTHALMIC | Status: DC | PRN

## 2023-06-24 MED ORDER — MORPHINE SULFATE (PF) 2 MG/ML IV SOLN
1.0000 mg | Freq: Once | INTRAVENOUS | Status: AC
  Administered 2023-06-24: 1 mg via INTRAVENOUS
  Filled 2023-06-24: qty 1

## 2023-06-24 MED ORDER — MORPHINE SULFATE (PF) 2 MG/ML IV SOLN
1.0000 mg | Freq: Once | INTRAVENOUS | Status: AC
  Administered 2023-06-24: 1 mg via INTRAVENOUS

## 2023-06-24 MED ORDER — FREE WATER: 200.0000 mL | Status: DC

## 2023-06-24 MED ORDER — GLYCOPYRROLATE 1 MG PO TABS: 1.0000 mg | ORAL_TABLET | ORAL | Status: DC | PRN

## 2023-06-24 MED ORDER — ACETAMINOPHEN 650 MG RE SUPP: 650.0000 mg | Freq: Four times a day (QID) | RECTAL | Status: DC | PRN

## 2023-06-24 MED ORDER — GLYCOPYRROLATE 0.2 MG/ML IJ SOLN
0.2000 mg | Freq: Once | INTRAMUSCULAR | Status: AC
  Administered 2023-06-24: 0.2 mg via INTRAVENOUS
  Filled 2023-06-24: qty 1

## 2023-06-24 MED ORDER — MORPHINE SULFATE (PF) 2 MG/ML IV SOLN
1.0000 mg | INTRAVENOUS | Status: DC | PRN
  Filled 2023-06-24: qty 1

## 2023-06-24 MED ORDER — DEXAMETHASONE SODIUM PHOSPHATE 4 MG/ML IJ SOLN
4.0000 mg | INTRAMUSCULAR | Status: DC
  Administered 2023-06-24: 4 mg via INTRAVENOUS
  Filled 2023-06-24: qty 1

## 2023-06-24 MED ORDER — METOPROLOL TARTRATE 5 MG/5ML IV SOLN
INTRAVENOUS | Status: AC
  Administered 2023-06-24: 5 mg via INTRAVENOUS
  Filled 2023-06-24: qty 5

## 2023-06-24 MED ORDER — FUROSEMIDE 10 MG/ML IJ SOLN
INTRAMUSCULAR | Status: AC
  Filled 2023-06-24: qty 4

## 2023-06-24 MED ORDER — ORAL CARE MOUTH RINSE: 15.0000 mL | OROMUCOSAL | Status: DC | PRN

## 2023-06-24 MED ORDER — ACETAMINOPHEN 325 MG PO TABS: 650.0000 mg | ORAL_TABLET | Freq: Four times a day (QID) | ORAL | Status: DC | PRN

## 2023-06-24 MED ORDER — DEXMEDETOMIDINE HCL IN NACL 400 MCG/100ML IV SOLN
INTRAVENOUS | Status: AC
  Administered 2023-06-24: 0.4 ug/kg/h via INTRAVENOUS
  Filled 2023-06-24: qty 100

## 2023-06-24 MED ORDER — MORPHINE SULFATE (PF) 2 MG/ML IV SOLN
2.0000 mg | INTRAVENOUS | Status: DC | PRN
  Administered 2023-06-24: 2 mg via INTRAVENOUS
  Filled 2023-06-24: qty 1

## 2023-06-24 NOTE — Progress Notes (Signed)
1200- Pt was extubated at this time and placed on bipap. Pt is alert. Pts v/s are stable at this time. Will continue to monitor.

## 2023-06-24 NOTE — Progress Notes (Addendum)
Daily Progress Note   Patient Name: Gilbert [name]       Date: 06/26/2023 DOB: 07-28-1951  Age: 72 y.o. MRN#: [id number] Attending Physician: Vida Rigger, MD Primary Care Physician: Shade Flood, MD Admit Date: 06/26/2023  Reason for Consultation/Follow-up: Establishing goals of care  Subjective: Notes and labs reviewed. In to see patient. Daughter [name] is at bedside with another male family member. Patient is sitting in bed with WOB noted, on BIPAP, mittens in place. She discusses that they are working with CCM. She states they are continuing full code status, and as of now, they do not want to replace the breathing tube, but Bloom decide differently depending on how he does. Family would like to continue to work directly with CCM as they are still actively making decisions. Updated CCM NP.   PMT will sign off as goals are being directly addressed with CCM.   Length of Stay: 9  Current Medications: Scheduled Meds:   Chlorhexidine Gluconate Cloth  6 each Topical Daily   clopidogrel  75 mg Per Tube Daily   enoxaparin (LOVENOX) injection  40 mg Subcutaneous QHS   feeding supplement (PROSource TF20)  60 mL Per Tube Daily   folic acid  1 mg Per Tube Daily   free water  200 mL Per Tube Q4H   furosemide  40 mg Intravenous Daily   insulin aspart  0-15 Units Subcutaneous Q4H   mouth rinse  15 mL Mouth Rinse Q2H   pantoprazole (PROTONIX) IV  40 mg Intravenous Q24H   sodium chloride flush  10 mL Intravenous Q12H   thiamine  100 mg Per Tube Daily   triamcinolone cream   Topical BID    Continuous Infusions:  feeding supplement (VITAL AF 1.2 CAL) 55 mL/hr at 06/17/2023 1325   levETIRAcetam Stopped (07/01/2023 0914)   propofol (DIPRIVAN) infusion Stopped (06/29/2023 0821)    PRN  Meds: docusate sodium, fentaNYL (SUBLIMAZE) injection, mouth rinse, polyethylene glycol  Physical Exam Pulmonary:     Comments: On Bipap.  Neurological:     Mental Status: He is alert.     Comments: Mittens in place.             Vital Signs: BP (!) 119/59   Pulse (!) 105   Temp (!) 100.6 F (38.1 C) (Temporal)   Resp Marland Kitchen)  30   Ht 5' 7.01" (1.702 m)   Wt 73.4 kg   SpO2 96%   BMI 25.34 kg/m  SpO2: SpO2: 96 % O2 Device: O2 Device: Ventilator O2 Flow Rate:    Intake/output summary:  Intake/Output Summary (Last 24 hours) at 06/29/2023 1329 Last data filed at 06/27/2023 1325 Gross per 24 hour  Intake 2420.82 ml  Output 1620 ml  Net 800.82 ml   LBM: Last BM Date : 06/23/23 Baseline Weight: Weight: 73.5 kg Most recent weight: Weight: 73.4 kg   Patient Active Problem List   Diagnosis Date Noted   Altered mental status 06/09/2023   Malnutrition of moderate degree 05/09/2023   Hyperkalemia 05/06/2023   Metabolic acidosis 05/06/2023   Acute systolic heart failure (HCC)    Mixed hyperlipidemia 12/24/2019   Chronic systolic heart failure (HCC) 03/23/2019   CHF (congestive heart failure) (HCC) 06/01/2017   Acute kidney injury superimposed on chronic kidney disease (HCC) 04/07/2017   Orthostatic hypotension 04/07/2017   Diffuse non-Hodgkin's lymphoma of bone (HCC) 01/08/2016   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/07/2016   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   Hematuria 10/29/2015   Dysphagia, pharyngoesophageal phase    Early satiety    Esophageal stricture    Gastritis and gastroduodenitis    PAD (peripheral artery disease) (HCC) 05/28/2014   Ischemic cardiomyopathy 04/03/2014   History of left heart catheterization (LHC) 02/26/2014   Arterial bleed, intraoperative 02/26/2014   Seizures (HCC) 11/27/2013   Essential hypertension 11/27/2013   Shock circulatory (HCC) 11/27/2013   Hypomagnesemia 11/27/2013   Coronary artery disease involving native  coronary artery of native heart without angina pectoris 11/26/2013   Renal cancer (HCC) 10/19/2013   Coronary artery calcification 09/25/2013   Renal cyst 08/30/2013   Loss of weight 08/03/2013    Palliative Care Assessment & Plan    Recommendations/Plan: Patient currently a full code with one way extubation, but family advises they Rahrig change their mind in the future about reintubation. They prefer to work directly with CCM moving forward.   Code Status:    Code Status Orders  (From admission, onward)           Start     Ordered   06/10/2023 2347  Full code  Continuous       Question:  By:  Answer:  Consent: discussion documented in EHR   06/11/2023 2347           Code Status History     Date Active Date Inactive Code Status Order ID Comments User Context   05/06/2023 0557 05/12/2023 1753 Full Code [id number]  Gery Pray, MD ED   03/23/2022 1753 03/24/2022 0052 Full Code [id number]  Yates Decamp, MD Inpatient   06/01/2017 2157 06/03/2017 1552 Full Code [id number]  Elder Negus, MD ED   04/07/2017 1930 04/08/2017 2005 Full Code [id number]  Hillary Bow, DO ED   06/10/2015 0808 06/10/2015 1456 Full Code [id number]  Yates Decamp, MD Inpatient   05/28/2014 1232 05/29/2014 1249 Full Code [id number]  Yates Decamp, MD Inpatient   02/26/2014 1615 02/27/2014 1328 Full Code [id number]  Yates Decamp, MD Inpatient   12/07/2013 1527 12/11/2013 1543 Full Code [id number]  Charlton Amor, PA-C Inpatient   11/27/2013 2022 12/07/2013 1527 Full Code [id number]  Yates Decamp, MD Inpatient   11/27/2013 0903 11/27/2013 2022 Full Code [id number]  Lonia Farber, MD Inpatient   11/27/2013 0255 11/27/2013 0903 Partial Code [id number]  Duayne Cal, NP Inpatient   11/27/2013 [phone]  11/27/2013 0255 Full Code [id number]  Kathlene Cote, PA-C ED   10/19/2013 1704 10/21/2013 1619 Full Code [id number]  Dwana Curd, MD Inpatient    Thank you for allowing the Palliative Medicine Team to assist in the care of this  patient.    Morton Stall, NP  Please contact Palliative Medicine Team phone at [phone] for questions and concerns.

## 2023-06-24 NOTE — IPAL (Addendum)
  Interdisciplinary Goals of Care Family Meeting   Date carried out: 06/08/2023  Location of the meeting: Bedside  Member's involved: Nurse Practitioner and Family Member or next of kin;   Durable Power of Attorney or acting medical decision maker: Pt's 2 daughters Gilbert [name] and Gilbert [name]    Discussion: We discussed goals of care for Gilbert [name] .  Reviewed pt's clinical course including intubation for encephalopathy and airway protection,  Pseudomonas pneumonia, HFrEF, and AKI.    Pneumonia has been treated, diuresing, bronch performed to clear secretions, and pt now extubated to BiPAP, but respiratory status remains tenuous with high risk for decompensation which would require intubation if aggressive measures and full scope of treatment were requested.  They state that he WOULD NOT WANT A TRACHEOSTOMY nor living in an nursing facility that it would require.  Discussed that we have optimized him medically with the above treatments and extubated.  Given he has been on the vent for 9 days and optimized, if he were to fail, if would be evidence that he is not able to survive without mechanical ventilation and trach.  If we were to re intubate him, then we would only be prolonging the inevitable, and would be committing towards a trach.  They have decided to change code status to DNR/DNI with plan to continue current aggressive measures including BiPAP. If he were to continue to decline despite these measures, they would want to transition to comfort measures and allow for natural death.   Code status:   Code Status: Limited: Do not attempt resuscitation (DNR) -DNR-LIMITED -Do Not Intubate/DNI    Disposition: Continue current acute care  Time spent for the meeting: 25 minutes     Harlon Ditty, AGACNP-BC [facility] Prefer epic messenger for cross cover needs If after hours, please call E-link  Judithe Modest, NP  06/03/2023, 2:20 PM

## 2023-06-24 NOTE — IPAL (Addendum)
Interdisciplinary Goals of Care Family Meeting     Date carried out: 06/11/2023 Location of the meeting: Bedside   Member's involved: NP and Family Member or next of kin   Durable Power of Attorney or Environmental health practitioner:    Discussion:  Advance Care Planning/Goals of Care discussion was performed during the course of treatment to decide on type of care right for this patient following admission to the ICU.   I spoke with patient's daughter Toniann Fail over the phone regarding goals of care in details following  change in patient's current status. Reviewed patient's worsening respiratory status with agonal breathing and unresponsiveness.  I informed her that the patient at this point will require mechanical ventilation to maintain his airway which patient and family had previusly objected to. Per daughter patient would not wish to pursue any life prolonging measure and would prefer a natural death.   Discussed prognosis, expected outcome with or without ongoing aggressive treatments and the options for de-escalation of care.   Diagnosis(es): Acute hypoxic hypercapnic respiratory failure secondary Pseudomonas Pneumonia, HFrEF, AKI on CKD, Toxic Metabolic Encephalopathy due to Phenytoin Toxicity Prognosis: Poor Code Status: DNR Disposition: ICU Next Steps:  Family understands the situation. They have consented and agreed to DNR/DNI and would not wish to pursue any aggressive treatment.  Patient's family would like to proceed with full comfort care.   Family are satisfied with Plan of action and management. All questions answered     Total Time Spent Face to Face addressing advance care planning in the presence of the Patient: 35 minutes       Webb Silversmith, DNP, FNP-C, AGACNP-BC Acute Care Nurse Practitioner [facility] Pager: [phone] [place] at [facility]

## 2023-06-24 NOTE — Consult Note (Signed)
PHARMACY CONSULT NOTE - ELECTROLYTES  Pharmacy Consult for Electrolyte Monitoring and Replacement   Recent Labs: Height: 5' 7.01" (170.2 cm) Weight: 73.4 kg (161 lb 13.1 oz) IBW/kg (Calculated) : 66.12 Estimated Creatinine Clearance: 44 mL/min (A) (by C-G formula based on SCr of 1.42 mg/dL (H)).  Potassium  Date Value  06/06/2023 3.8 mmol/L  04/20/2017 3.6 mEq/L   Magnesium (mg/dL)  Date Value  09/81/1914 1.9   Calcium (mg/dL)  Date Value  78/29/5621 8.0 (L)  04/20/2017 8.8   Albumin (g/dL)  Date Value  30/86/5784 1.5 (L)  04/20/2017 3.0 (L)   Phosphorus (mg/dL)  Date Value  69/62/9528 3.6   Sodium  Date Value  06/16/2023 148 mmol/L (H)  12/10/2021 142 mmol/L  04/20/2017 140 mEq/L   Assessment  Gilbert [name] is a 72 y.o. male presenting with possible pancreatitis and duodenal diverticulitis. 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 Pharmacy has been consulted to monitor and replace electrolytes.  Diuresis: IV Lasix 40 mg daily  Goal of Therapy: Electrolytes WNL  Plan:  --Na 148, secondary to diuresis --No electrolyte replacement indicated at this time --Follow-up electrolytes with AM labs tomorrow  Thank you for allowing pharmacy to be a part of this patient's care.  Tressie Ellis 06/07/2023 9:36 AM

## 2023-06-24 NOTE — Plan of Care (Signed)
  Problem: Clinical Measurements: Goal: Ability to maintain clinical measurements within normal limits will improve Outcome: Progressing   Problem: Activity: Goal: Risk for activity intolerance will decrease Outcome: Progressing   Problem: Elimination: Goal: Will not experience complications related to bowel motility Outcome: Progressing   Problem: Pain Management: Goal: General experience of comfort will improve Outcome: Progressing   Problem: Tissue Perfusion: Goal: Adequacy of tissue perfusion will improve Outcome: Progressing

## 2023-06-24 NOTE — Progress Notes (Signed)
Per Dr. Karna Christmas, removed patient's safety mitts and NGT.
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
## 2023-06-25 ENCOUNTER — Telehealth: Payer: Self-pay | Admitting: Family Medicine

## 2023-06-26 LAB — GLUCOSE, CAPILLARY: Glucose-Capillary: 227 mg/dL — ABNORMAL HIGH (ref 70–99)

## 2023-06-28 ENCOUNTER — Ambulatory Visit: Payer: Medicare Other | Admitting: Hematology & Oncology

## 2023-06-28 ENCOUNTER — Other Ambulatory Visit: Payer: Medicare Other

## 2023-06-28 ENCOUNTER — Inpatient Hospital Stay: Payer: Medicare Other

## 2023-07-03 NOTE — Death Summary Note (Signed)
DEATH SUMMARY   Patient Details  Name: Gilbert [name] MRN: [id number] DOB: [birth year]  Admission/Discharge Information   Admit Date:  Jun 19, 2023  Date of Death: Date of Death: 2023/06/28  Time of Death: Time of Death: 07-07-2335  Length of Stay: 16-Jul-2023  Referring Physician: Shade Flood, MD   Reason(s) for Hospitalization  Altered  mental status  Diagnoses  Preliminary cause of death: Acute hypoxic respiratory Failure secondary to Pseudomonas Pneumonia, sepsis with shock secondary to pneumonia Secondary Diagnoses (including complications and co-morbidities):  Principal Problem:   Altered mental status Active Problems:   Shock circulatory [facility])   Brief Hospital Course (including significant findings, care, treatment, and services provided and events leading to death)  Gilbert [name] is a 72 y.o. 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   Patient was emergently intubated for airway protection due to toxic metabolic Encephalopathy initially of unknown etiology. He was admitted to ICU with Acute Hypoxic and Hypercapnic Respiratory Failure and Acute Toxic metabolic Encephalopathy due to suspected Dilantin toxicity. Further labwork and imaging revealed liver injury with acute pancreatitis likely secondary to phenytoin toxicity.  Teleneurologist consulted who recommended MRI brain.  He was initiated on IV fluids given history of heart failure as well as AKI.  Patient was also started on Protonix with daily LFTs monitoring and follow-up right upper quadrant ultrasound.  On 06/17/2023 patient remained intubated  and sedated.  On 06/17/2023, neurology was consulted for Dilantin toxicity, patient was switched to Keppra with recommendation to monitor Dilantin levels.  SBT was attempted but patient failed.  06/18/2023 -06/20/23 patient remained intubated and sedated unable to perform SBT due to mental status.  On 11/19 patient noted with cuff leak requiring ETT tube exchange.  Patient also had a bronchoscopy and BAL sent.  Palliative care consulted for goals of care.  Per family, they would wish to address close goals of care postextubation.  Cultures resulted back with Pseudomonas patient continued on antibiotics for Pseudomonas coverage.  On 06/22/2023, SBT attempted but was noted with respiration rate in the 30s therefore unable to extubate.  Goals of care discussed with patient's family who advised that they would like to continue with current measures to try and optimize patient for best chance of successful one-way extubation.  On 06/23/2023 patient was extubated to BiPAP.  On 06-28-2023 following multiple failed SBT, goals of care again addressed with family who stated patient would not want a tracheostomy noted living in nursing facility.  Given he has been on the vent for 9 days and optimize if he were to be reintubated he will only be prolonging the inevitable.  Decided to change CODE STATUS to DNR/DNI with plan to continue aggressive measures including BiPAP however he if he were to decline further they would want to transition him to comfort measures and allow for natural death.  Patient current status continue to decline, he was placed back on BiPAP but was noted with increased work of breathing and desatting into the 50s.  Patient was obtunded with respirations in the 30s.  Due to rapidly declining clinical status patient's family was contacted and they decided to proceed with comfort measures only.  Patient was transition to comfort  care and he passed away shortly after at 23:36 PM family members came to the  bedside.  Pertinent Labs and Studies  Significant Diagnostic Studies DG Chest Port 1 View  Result Date: 06/23/2023 CLINICAL DATA:  72 year old male with respiratory failure, hypoxia. EXAM: PORTABLE CHEST 1 VIEW COMPARISON:  Portable chest 06/21/2023 and earlier. CTA chest 06/16/2023. FINDINGS: Portable AP supine view at 0507 hours. More kyphotic positioning and rotation to the right. Satisfactory endotracheal tube. Enteric tube courses to the level of the stomach. Stable right chest power port. Bilateral lung base veiling opacity. No pneumothorax or pulmonary edema. Stable cardiac size and mediastinal contours. No air bronchograms are visible. IMPRESSION: 1. Satisfactory lines and tubes. 2. Bilateral pleural effusions.  No new cardiopulmonary abnormality. Electronically Signed   By: Odessa Fleming M.D.   On: 06/23/2023 09:48   DG Abd 1 View  Result Date: 06/23/2023 CLINICAL DATA:  72 year old male NG tube placement. EXAM: ABDOMEN - 1 VIEW COMPARISON:  06/16/2023. FINDINGS: Portable AP semi upright view at 0906 hours. Enteric tube placed into the stomach, side hole the level of the gastric body. Similar gastric air and visible bowel gas since 06/16/2023. Ongoing veiling opacity at the left lung base. IMPRESSION: 1. Satisfactory enteric tube placement in the stomach. 2. Stable bowel gas pattern since 06/16/2023. Electronically Signed   By: Odessa Fleming M.D.   On: 06/23/2023 09:47   DG Chest Port 1 View  Result Date: 06/21/2023 CLINICAL DATA:  Respiratory failure EXAM: PORTABLE CHEST 1 VIEW COMPARISON:  06/18/2023 FINDINGS: Endotracheal tube seen 6.5 cm above the carina. Nasogastric tube tip overlies the gastric fundus with the proximal side hole at the gastroesophageal junction. Right internal jugular chest port tip is seen in the superior vena cava, unchanged. Small bilateral pleural effusions are present with associated bibasilar atelectasis and/or infiltrate and resultant retrocardiac opacification. No  pneumothorax. Cardiac size within normal limits. Pulmonary vascularity is normal. No acute bone abnormality. IMPRESSION: 1. Support apparatus as described above. 2. Small bilateral pleural effusions with associated bibasilar atelectasis and/or infiltrate. Electronically Signed   By: Helyn Numbers M.D.   On: 06/21/2023 13:29   ECHOCARDIOGRAM COMPLETE  Result Date: 06/18/2023    ECHOCARDIOGRAM REPORT   Patient Name:   KELSEY Cowdrey Date of Exam: 06/18/2023 Medical Rec #:  [id number]   Height:       67.0 in Accession #:    [id number]  Weight:       168.9 lb Date of Birth:  Oct 17, 1950   BSA:          1.882 m Patient Age:    72 years    BP:           119/49 mmHg Patient Gender: M           HR:           52 bpm. Exam Location:  [facility] Procedure: 2D Echo Indications:     Dyspnea R06.00  History:         Patient has prior history of Echocardiogram examinations, most                  recent 11/18/2022.  Sonographer:     Overton Mam RDCS, FASE Referring Phys:  [id number] Ezequiel Essex Diagnosing Phys: Julien Nordmann MD  Sonographer Comments: Echo performed with patient supine and on artificial respirator and no subcostal window. IMPRESSIONS  1. Left ventricular ejection fraction, by estimation, is 30 to 35%. Left ventricular ejection fraction by PLAX is 35 %.  The left ventricle has moderately decreased function. The left ventricle demonstrates global hypokinesis. Left ventricular diastolic parameters are consistent with Grade I diastolic dysfunction (impaired relaxation).  2. Right ventricular systolic function is normal. The right ventricular size is normal. There is mildly elevated pulmonary artery systolic pressure. The estimated right ventricular systolic pressure is 36.8 mmHg.  3. The mitral valve is normal in structure. Mild mitral valve regurgitation. No evidence of mitral stenosis.  4. The aortic valve has an indeterminant number of cusps. Aortic valve regurgitation is not visualized. No aortic stenosis is present.   5. The inferior vena cava is normal in size with greater than 50% respiratory variability, suggesting right atrial pressure of 3 mmHg. FINDINGS  Left Ventricle: Left ventricular ejection fraction, by estimation, is 30 to 35%. Left ventricular ejection fraction by PLAX is 35 %. The left ventricle has moderately decreased function. The left ventricle demonstrates global hypokinesis. The left ventricular internal cavity size was normal in size. There is no left ventricular hypertrophy. Left ventricular diastolic parameters are consistent with Grade I diastolic dysfunction (impaired relaxation). Right Ventricle: The right ventricular size is normal. No increase in right ventricular wall thickness. Right ventricular systolic function is normal. There is mildly elevated pulmonary artery systolic pressure. The tricuspid regurgitant velocity is 2.82  m/s, and with an assumed right atrial pressure of 5 mmHg, the estimated right ventricular systolic pressure is 36.8 mmHg. Left Atrium: Left atrial size was normal in size. Right Atrium: Right atrial size was normal in size. Pericardium: There is no evidence of pericardial effusion. Mitral Valve: The mitral valve is normal in structure. Mild mitral valve regurgitation. No evidence of mitral valve stenosis. Tricuspid Valve: The tricuspid valve is normal in structure. Tricuspid valve regurgitation is mild . No evidence of tricuspid stenosis. Aortic Valve: The aortic valve has an indeterminant number of cusps. Aortic valve regurgitation is not visualized. No aortic stenosis is present. Aortic valve peak gradient measures 4.2 mmHg. Pulmonic Valve: The pulmonic valve was normal in structure. Pulmonic valve regurgitation is not visualized. No evidence of pulmonic stenosis. Aorta: The aortic root is normal in size and structure. Venous: The inferior vena cava is normal in size with greater than 50% respiratory variability, suggesting right atrial pressure of 3 mmHg. IAS/Shunts: No  atrial level shunt detected by color flow Doppler.  LEFT VENTRICLE PLAX 2D LV EF:         Left            Diastology                ventricular     LV e' medial:    4.03 cm/s                ejection        LV E/e' medial:  13.6                fraction by     LV e' lateral:   4.46 cm/s                PLAX is 35      LV E/e' lateral: 12.3                %. LVIDd:         5.35 cm LVIDs:         4.45 cm LV PW:         1.10 cm LV IVS:        1.00 cm LVOT  diam:     1.90 cm LV SV:         58 LV SV Index:   31 LVOT Area:     2.84 cm  LV Volumes (MOD) LV vol d, MOD    105.0 ml A2C: LV vol d, MOD    93.3 ml A4C: LV vol s, MOD    53.2 ml A2C: LV vol s, MOD    56.9 ml A4C: LV SV MOD A2C:   51.8 ml LV SV MOD A4C:   93.3 ml LV SV MOD BP:    47.9 ml RIGHT VENTRICLE RV Basal diam:  2.60 cm RV S prime:     12.10 cm/s TAPSE (M-mode): 1.9 cm LEFT ATRIUM             Index        RIGHT ATRIUM          Index LA diam:        3.60 cm 1.91 cm/m   RA Area:     7.63 cm LA Vol (A2C):   40.3 ml 21.41 ml/m  RA Volume:   13.30 ml 7.07 ml/m LA Vol (A4C):   42.7 ml 22.69 ml/m LA Biplane Vol: 44.9 ml 23.86 ml/m  AORTIC VALVE AV Area (Vmax): 2.75 cm AV Vmax:        103.00 cm/s AV Peak Grad:   4.2 mmHg LVOT Vmax:      99.80 cm/s LVOT Vmean:     62.500 cm/s LVOT VTI:       0.203 m  AORTA Ao Root diam: 3.70 cm MITRAL VALVE               TRICUSPID VALVE MV Area (PHT): 3.00 cm    TR Peak grad:   31.8 mmHg MV Decel Time: 253 msec    TR Vmax:        282.00 cm/s MV E velocity: 55.00 cm/s MV A velocity: 99.00 cm/s  SHUNTS MV E/A ratio:  0.56        Systemic VTI:  0.20 m                            Systemic Diam: 1.90 cm Julien Nordmann MD Electronically signed by Julien Nordmann MD Signature Date/Time: 06/18/2023/3:40:51 PM    Final    DG Chest Port 1 View  Result Date: 06/18/2023 CLINICAL DATA:  72 year old male history of shortness of breath. EXAM: PORTABLE CHEST 1 VIEW COMPARISON:  Chest x-ray 06/08/2023. FINDINGS: An endotracheal tube is in  place with tip 3.0 cm above the carina. A nasogastric tube is seen extending into the stomach, however, the tip of the nasogastric tube extends below the lower margin of the image. Right internal jugular single-lumen power porta cath with tip terminating at the superior cavoatrial junction. Lung volumes are low. Worsening bibasilar opacities (left-greater-than-right) which [place] reflect areas of atelectasis and/or consolidation. Moderate left and small right pleural effusions. No pneumothorax. No evidence of pulmonary edema. Cardiac silhouette is largely obscured. Upper mediastinal contours are distorted by patient's rotation to the left. Atherosclerotic calcifications in the thoracic aorta. IMPRESSION: 1. Support apparatus, as above. 2. Worsening bibasilar opacities which Akers reflect areas of atelectasis and/or consolidation, with superimposed moderate left and small right pleural effusions. 3. Aortic atherosclerosis. Electronically Signed   By: Trudie Reed M.D.   On: 06/18/2023 10:35   EEG adult  Result Date: 06/16/2023 Charlsie Quest, MD  06/16/2023  8:19 PM Patient Name: Kyriakos Kapusta MRN: [id number] Epilepsy Attending: Charlsie Quest Referring Physician/Provider: Gordy Councilman, MD Date: 06/16/2023 Duration: 32.04 mins Patient history: 72yo M with ams getting eeg to evaluate for seizure Level of alertness:  comatose AEDs during EEG study: Propofol, LEV Technical aspects: This EEG study was done with scalp electrodes positioned according to the 10-20 International system of electrode placement. Electrical activity was reviewed with band pass filter of 1-70Hz , sensitivity of 7 uV/mm, display speed of 68mm/sec with a 60Hz  notched filter applied as appropriate. EEG data were recorded continuously and digitally stored.  Video monitoring was available and reviewed as appropriate. Description: EEG showed continuous generalized 3 to 6 Hz theta-delta slowing admixed with 13-15Hz  beta activity.  Hyperventilation and photic stimulation were not performed.   ABNORMALITY - Continuous slow, generalized IMPRESSION: This study is suggestive of moderate to severe diffuse encephalopathy. No seizures or epileptiform discharges were seen throughout the recording. Priyanka Annabelle Harman   US Abdomen Limited RUQ (LIVER/GB)  Result Date: 06/16/2023 CLINICAL DATA:  Abdominal pain EXAM: ULTRASOUND ABDOMEN LIMITED RIGHT UPPER QUADRANT COMPARISON:  CT abdomen pelvis 06/16/2023 FINDINGS: Gallbladder: Gallstones: None Sludge: None Gallbladder Wall: Within normal limits Pericholecystic fluid: None Sonographic Murphy's Sign: Negative per technologist Common bile duct: Diameter: 3 mm Liver: Parenchymal echogenicity: Homogeneously increased Contours: Normal Lesions: None Portal vein: Patent.  Hepatopetal flow Other: None. IMPRESSION: 1. No acute abnormality of the liver or gallbladder. 2. Diffuse increased echogenicity of the hepatic parenchyma is a nonspecific indicator of hepatocellular dysfunction, most commonly steatosis. Electronically Signed   By: Acquanetta Belling M.D.   On: 06/16/2023 20:08   MR THORACIC SPINE WO CONTRAST  Result Date: 06/16/2023 CLINICAL DATA:  Mid back pain. Compression fracture with intermittent bilateral lower extremity weakness and numbness. EXAM: MRI CERVICAL, THORACIC AND LUMBAR SPINE WITHOUT CONTRAST TECHNIQUE: Multiplanar and multiecho pulse sequences of the cervical spine, to include the craniocervical junction and cervicothoracic junction, and thoracic and lumbar spine, were obtained without intravenous contrast. COMPARISON:  Chest CT from earlier the same day FINDINGS: MRI CERVICAL SPINE FINDINGS Alignment: Normal Vertebrae: No fracture, evidence of discitis, or bone lesion. Cord: Normal signal and morphology. Posterior Fossa, vertebral arteries, paraspinal tissues: Diffuse atrophy of intrinsic neck muscles. No perispinal mass or collection. Disc levels: Disc narrowing and desiccation with  bulge especially at C2-3 to C5-6. No significant facet spurring, mild ligamentum flavum thickening at C3-4 to C5-6. Disc height loss and uncovertebral ridging causes biforaminal stenosis at C5-6, impingement greater on the right. MRI THORACIC SPINE FINDINGS Alignment:  Unremarkable when allowing for positioning. Vertebrae: Lesion in the T11 body which is T1 hypointense towards the left. The right aspect of the body appears fatty. This lesion, with chronic superior endplate fracture with seen on a 2017 PET CT and is considered benign. Fatty change towards the right aspect of the vertebral body could be from radiotherapy or hemangioma. Bone marrow heterogeneity elsewhere appears benign. No acute fracture. Cord:  Normal signal and morphology. Paraspinal and other soft tissues: Diffuse atrophy of intrinsic back muscles. Layering pleural effusions, there has been recent CT. Disc levels: Ordinary disc desiccation and narrowing diffusely. No neural impingement. MRI LUMBAR SPINE FINDINGS Segmentation:  Standard. Alignment:  Physiologic. Vertebrae:  No fracture, evidence of discitis, or bone lesion. Conus medullaris and cauda equina: Conus extends to the L1 level. Conus and cauda equina appear normal. Paraspinal and other soft tissues: Diffuse atrophy of intrinsic back muscles. Retroperitoneal edema which is generalized. Disc levels:  T12- L1: Unremarkable. L1-L2: Disc narrowing and bulging with mild ridging. L2-L3: Disc narrowing and bulging with mild ridging. L3-L4: Disc narrowing and circumferential bulging. Mild endplate ridging. Mild triangular narrowing of the thecal sac. L4-L5: Disc collapse and endplate degeneration with circumferential ridging. Degenerative facet spurring on both sides. Mild right foraminal narrowing. L5-S1:Mild disc bulging and facet spurring. IMPRESSION: Cervical spine: 1. Normal appearance of the cord. 2. Mild for age degeneration. Degenerative foraminal stenosis on the right more than left at  C5-6. Widely patent spinal canal. Thoracic spine: 1. Normal appearance of the cord. 2. Chronic lesion with superior endplate fracture at T11, present since at least the 2017 PET CT. No aggressive bone lesion or fracture. Lumbar spine: Degeneration without acute finding or neural impingement. Electronically Signed   By: Tiburcio Pea M.D.   On: 06/16/2023 05:56   MR LUMBAR SPINE WO CONTRAST  Result Date: 06/16/2023 CLINICAL DATA:  Mid back pain. Compression fracture with intermittent bilateral lower extremity weakness and numbness. EXAM: MRI CERVICAL, THORACIC AND LUMBAR SPINE WITHOUT CONTRAST TECHNIQUE: Multiplanar and multiecho pulse sequences of the cervical spine, to include the craniocervical junction and cervicothoracic junction, and thoracic and lumbar spine, were obtained without intravenous contrast. COMPARISON:  Chest CT from earlier the same day FINDINGS: MRI CERVICAL SPINE FINDINGS Alignment: Normal Vertebrae: No fracture, evidence of discitis, or bone lesion. Cord: Normal signal and morphology. Posterior Fossa, vertebral arteries, paraspinal tissues: Diffuse atrophy of intrinsic neck muscles. No perispinal mass or collection. Disc levels: Disc narrowing and desiccation with bulge especially at C2-3 to C5-6. No significant facet spurring, mild ligamentum flavum thickening at C3-4 to C5-6. Disc height loss and uncovertebral ridging causes biforaminal stenosis at C5-6, impingement greater on the right. MRI THORACIC SPINE FINDINGS Alignment:  Unremarkable when allowing for positioning. Vertebrae: Lesion in the T11 body which is T1 hypointense towards the left. The right aspect of the body appears fatty. This lesion, with chronic superior endplate fracture with seen on a 2017 PET CT and is considered benign. Fatty change towards the right aspect of the vertebral body could be from radiotherapy or hemangioma. Bone marrow heterogeneity elsewhere appears benign. No acute fracture. Cord:  Normal signal and  morphology. Paraspinal and other soft tissues: Diffuse atrophy of intrinsic back muscles. Layering pleural effusions, there has been recent CT. Disc levels: Ordinary disc desiccation and narrowing diffusely. No neural impingement. MRI LUMBAR SPINE FINDINGS Segmentation:  Standard. Alignment:  Physiologic. Vertebrae:  No fracture, evidence of discitis, or bone lesion. Conus medullaris and cauda equina: Conus extends to the L1 level. Conus and cauda equina appear normal. Paraspinal and other soft tissues: Diffuse atrophy of intrinsic back muscles. Retroperitoneal edema which is generalized. Disc levels: T12- L1: Unremarkable. L1-L2: Disc narrowing and bulging with mild ridging. L2-L3: Disc narrowing and bulging with mild ridging. L3-L4: Disc narrowing and circumferential bulging. Mild endplate ridging. Mild triangular narrowing of the thecal sac. L4-L5: Disc collapse and endplate degeneration with circumferential ridging. Degenerative facet spurring on both sides. Mild right foraminal narrowing. L5-S1:Mild disc bulging and facet spurring. IMPRESSION: Cervical spine: 1. Normal appearance of the cord. 2. Mild for age degeneration. Degenerative foraminal stenosis on the right more than left at C5-6. Widely patent spinal canal. Thoracic spine: 1. Normal appearance of the cord. 2. Chronic lesion with superior endplate fracture at T11, present since at least the 2017 PET CT. No aggressive bone lesion or fracture. Lumbar spine: Degeneration without acute finding or neural impingement. Electronically Signed   By: Tiburcio Pea  M.D.   On: 06/16/2023 05:56   MR CERVICAL SPINE WO CONTRAST  Result Date: 06/16/2023 CLINICAL DATA:  Mid back pain. Compression fracture with intermittent bilateral lower extremity weakness and numbness. EXAM: MRI CERVICAL, THORACIC AND LUMBAR SPINE WITHOUT CONTRAST TECHNIQUE: Multiplanar and multiecho pulse sequences of the cervical spine, to include the craniocervical junction and  cervicothoracic junction, and thoracic and lumbar spine, were obtained without intravenous contrast. COMPARISON:  Chest CT from earlier the same day FINDINGS: MRI CERVICAL SPINE FINDINGS Alignment: Normal Vertebrae: No fracture, evidence of discitis, or bone lesion. Cord: Normal signal and morphology. Posterior Fossa, vertebral arteries, paraspinal tissues: Diffuse atrophy of intrinsic neck muscles. No perispinal mass or collection. Disc levels: Disc narrowing and desiccation with bulge especially at C2-3 to C5-6. No significant facet spurring, mild ligamentum flavum thickening at C3-4 to C5-6. Disc height loss and uncovertebral ridging causes biforaminal stenosis at C5-6, impingement greater on the right. MRI THORACIC SPINE FINDINGS Alignment:  Unremarkable when allowing for positioning. Vertebrae: Lesion in the T11 body which is T1 hypointense towards the left. The right aspect of the body appears fatty. This lesion, with chronic superior endplate fracture with seen on a 2017 PET CT and is considered benign. Fatty change towards the right aspect of the vertebral body could be from radiotherapy or hemangioma. Bone marrow heterogeneity elsewhere appears benign. No acute fracture. Cord:  Normal signal and morphology. Paraspinal and other soft tissues: Diffuse atrophy of intrinsic back muscles. Layering pleural effusions, there has been recent CT. Disc levels: Ordinary disc desiccation and narrowing diffusely. No neural impingement. MRI LUMBAR SPINE FINDINGS Segmentation:  Standard. Alignment:  Physiologic. Vertebrae:  No fracture, evidence of discitis, or bone lesion. Conus medullaris and cauda equina: Conus extends to the L1 level. Conus and cauda equina appear normal. Paraspinal and other soft tissues: Diffuse atrophy of intrinsic back muscles. Retroperitoneal edema which is generalized. Disc levels: T12- L1: Unremarkable. L1-L2: Disc narrowing and bulging with mild ridging. L2-L3: Disc narrowing and bulging with  mild ridging. L3-L4: Disc narrowing and circumferential bulging. Mild endplate ridging. Mild triangular narrowing of the thecal sac. L4-L5: Disc collapse and endplate degeneration with circumferential ridging. Degenerative facet spurring on both sides. Mild right foraminal narrowing. L5-S1:Mild disc bulging and facet spurring. IMPRESSION: Cervical spine: 1. Normal appearance of the cord. 2. Mild for age degeneration. Degenerative foraminal stenosis on the right more than left at C5-6. Widely patent spinal canal. Thoracic spine: 1. Normal appearance of the cord. 2. Chronic lesion with superior endplate fracture at T11, present since at least the 2017 PET CT. No aggressive bone lesion or fracture. Lumbar spine: Degeneration without acute finding or neural impingement. Electronically Signed   By: Tiburcio Pea M.D.   On: 06/16/2023 05:56   MR BRAIN WO CONTRAST  Result Date: 06/16/2023 CLINICAL DATA:  Mental status change with unknown cause. History of meningioma EXAM: MRI HEAD WITHOUT CONTRAST TECHNIQUE: Multiplanar, multiecho pulse sequences of the brain and surrounding structures were obtained without intravenous contrast. COMPARISON:  Head CT from yesterday.  Head CT 02/12/2016 FINDINGS: Brain: No acute infarction, hemorrhage, hydrocephalus, extra-axial collection. High anterior left parafalcine mass which is dural-based and measures 2 cm, consistent with meningioma. Internal T1 hyperintensity from fatty metaplasia. Generalized cerebral volume loss with mild chronic small vessel ischemia in the hemispheric white matter. Vascular: Major flow voids are preserved Skull and upper cervical spine: No focal marrow lesion. Prominent diffuse fatty atrophy of the covered musculature, including symmetrically at the tongue Sinuses/Orbits: Bilateral cataract resection. Nasopharyngeal  fluid in the setting of intubation. IMPRESSION: 1. No acute finding. 2. Long-standing 2 cm anterior left parafalcine meningioma with limited  growth since 2017. Electronically Signed   By: Tiburcio Pea M.D.   On: 06/16/2023 05:47   DG Abd 1 View  Result Date: 06/16/2023 CLINICAL DATA:  72 year old male status post nasogastric tube placement. EXAM: ABDOMEN - 1 VIEW COMPARISON:  Chest x-ray 10/21/2015. FINDINGS: Nasogastric tube extends into the proximal stomach. Gas is noted throughout multiple nondilated loops of gas-filled small bowel in the upper abdomen. Lower abdomen is incompletely imaged. Small amount of colonic gas and stool is also noted. No pneumoperitoneum in the visualized portions of the abdomen. IMPRESSION: 1. Support apparatus, as above. 2. Nonobstructive bowel-gas pattern. Electronically Signed   By: Trudie Reed M.D.   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 EXAM: CT ANGIOGRAPHY CHEST CT ABDOMEN AND PELVIS WITH CONTRAST TECHNIQUE: Multidetector CT imaging of the chest was performed using the standard protocol during bolus administration of intravenous contrast. Multiplanar CT image reconstructions and MIPs were obtained to evaluate the vascular anatomy. Multidetector CT imaging of the abdomen and pelvis was performed using the standard protocol during bolus administration of intravenous contrast. RADIATION DOSE REDUCTION: This exam was performed according to the departmental dose-optimization program which includes automated exposure control, adjustment of the mA and/or kV according to patient size and/or use of iterative reconstruction technique. CONTRAST:  75mL OMNIPAQUE IOHEXOL 350 MG/ML SOLN COMPARISON:  Chest radiograph 06/18/2023; CT chest 05/07/2023; CT renal stone protocol 05/06/2023 FINDINGS: CTA CHEST FINDINGS Cardiovascular: Negative for acute pulmonary embolism. Trace pericardial effusion. Coronary  artery and aortic atherosclerotic calcification. Right chest wall Port-A-Cath tip in the low SVC. Mediastinum/Nodes: Endotracheal tube tip in the intrathoracic trachea. Layering debris in the posterior trachea extending into the right mainstem bronchus. The enteric tube is curled in the distal esophagus with tip oriented superiorly in the distal esophagus. Recommend repositioning. No lymphadenopathy. Lungs/Pleura: Emphysema. Mild bronchial wall thickening greatest in the lower lobes. Small bilateral pleural effusions and compressive atelectasis, increased from 05/07/2023. Musculoskeletal: Sclerosis in the T11 vertebral body with unchanged superior endplate compression fracture. No acute fracture. Unchanged sclerosis in the right lateral sixth rib and left glenoid. Review of the MIP images confirms the above findings. CT ABDOMEN and PELVIS FINDINGS Hepatobiliary: No focal liver abnormality is seen. No gallstones, gallbladder wall thickening, or biliary dilatation. Pancreas: There is mild stranding about the pancreas, new from 05/06/2023. No ductal dilation or organized fluid collection. Spleen: Unremarkable. Adrenals/Urinary Tract: Right adrenalectomy and nephrectomy. Stable left adrenal gland and kidney. No urinary calculi or hydronephrosis. Nondistended bladder about a Foley catheter. Stomach/Bowel: Rectal tube. No bowel obstruction. Nondistended stomach. There is wall thickening and adjacent stranding about the descending portion of the duodenum. This is centered about a duodenal diverticulum. Normal appendix. Vascular/Lymphatic: Advanced aortic atherosclerotic calcification. Left iliac artery stent. No lymphadenopathy. Reproductive: No acute abnormality. Other: Small volume free fluid in the pelvis, left anterior pararenal space and about the pancreatic head and descending duodenum. No free intraperitoneal air. Musculoskeletal: No acute fracture. Review of the MIP images confirms the above findings. IMPRESSION:  1. Negative for acute pulmonary embolism. 2. Small bilateral pleural effusions and compressive atelectasis, increased from 05/07/2023. Pneumonia is difficult to exclude. 3. Bronchitis. 4. Enteric tube is curled in  the distal esophagus with tip oriented superiorly in the distal esophagus. Recommend repositioning. 5. Wall thickening and adjacent stranding about the descending portion of the duodenum centered about a duodenal diverticulum. Stranding and fluid about the pancreas. Findings Jeffreys be due to pancreatitis with reactive duodenitis or duodenal diverticulitis. 6. Unchanged indeterminate sclerosis in the T11 vertebral body, right lateral sixth rib, and left glenoid. Similar compression fracture of T11. Aortic Atherosclerosis (ICD10-I70.0) and Emphysema (ICD10-J43.9). Electronically Signed   By: Minerva Fester M.D.   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 EXAM: CT ANGIOGRAPHY CHEST CT ABDOMEN AND PELVIS WITH CONTRAST TECHNIQUE: Multidetector CT imaging of the chest was performed using the standard protocol during bolus administration of intravenous contrast. Multiplanar CT image reconstructions and MIPs were obtained to evaluate the vascular anatomy. Multidetector CT imaging of the abdomen and pelvis was performed using the standard protocol during bolus administration of intravenous contrast. RADIATION DOSE REDUCTION: This exam was performed according to the departmental dose-optimization program which includes automated exposure control, adjustment of the mA and/or kV according to patient size and/or use of iterative reconstruction technique. CONTRAST:  75mL OMNIPAQUE IOHEXOL 350 MG/ML SOLN COMPARISON:  Chest radiograph 06/13/2023; CT chest 05/07/2023; CT renal stone protocol 05/06/2023 FINDINGS: CTA CHEST FINDINGS  Cardiovascular: Negative for acute pulmonary embolism. Trace pericardial effusion. Coronary artery and aortic atherosclerotic calcification. Right chest wall Port-A-Cath tip in the low SVC. Mediastinum/Nodes: Endotracheal tube tip in the intrathoracic trachea. Layering debris in the posterior trachea extending into the right mainstem bronchus. The enteric tube is curled in the distal esophagus with tip oriented superiorly in the distal esophagus. Recommend repositioning. No lymphadenopathy. Lungs/Pleura: Emphysema. Mild bronchial wall thickening greatest in the lower lobes. Small bilateral pleural effusions and compressive atelectasis, increased from 05/07/2023. Musculoskeletal: Sclerosis in the T11 vertebral body with unchanged superior endplate compression fracture. No acute fracture. Unchanged sclerosis in the right lateral sixth rib and left glenoid. Review of the MIP images confirms the above findings. CT ABDOMEN and PELVIS FINDINGS Hepatobiliary: No focal liver abnormality is seen. No gallstones, gallbladder wall thickening, or biliary dilatation. Pancreas: There is mild stranding about the pancreas, new from 05/06/2023. No ductal dilation or organized fluid collection. Spleen: Unremarkable. Adrenals/Urinary Tract: Right adrenalectomy and nephrectomy. Stable left adrenal gland and kidney. No urinary calculi or hydronephrosis. Nondistended bladder about a Foley catheter. Stomach/Bowel: Rectal tube. No bowel obstruction. Nondistended stomach. There is wall thickening and adjacent stranding about the descending portion of the duodenum. This is centered about a duodenal diverticulum. Normal appendix. Vascular/Lymphatic: Advanced aortic atherosclerotic calcification. Left iliac artery stent. No lymphadenopathy. Reproductive: No acute abnormality. Other: Small volume free fluid in the pelvis, left anterior pararenal space and about the pancreatic head and descending duodenum. No free intraperitoneal air.  Musculoskeletal: No acute fracture. Review of the MIP images confirms the above findings. IMPRESSION: 1. Negative for acute pulmonary embolism. 2. Small bilateral pleural effusions and compressive atelectasis, increased from 05/07/2023. Pneumonia is difficult to exclude. 3. Bronchitis. 4. Enteric tube is curled in the distal esophagus with tip oriented superiorly in the distal esophagus. Recommend repositioning. 5. Wall thickening and adjacent stranding about the descending portion of the duodenum centered about a duodenal diverticulum. Stranding and fluid about the pancreas. Findings Meyers be due to pancreatitis with reactive duodenitis or duodenal diverticulitis. 6. Unchanged indeterminate sclerosis in the  T11 vertebral body, right lateral sixth rib, and left glenoid. Similar compression fracture of T11. Aortic Atherosclerosis (ICD10-I70.0) and Emphysema (ICD10-J43.9). Electronically Signed   By: Minerva Fester M.D.   On: 06/16/2023 01:19   DG Chest Portable 1 View  Result Date: 06/16/2023 CLINICAL DATA:  ETT placement EXAM: PORTABLE CHEST 1 VIEW COMPARISON:  06/10/2023 at 6:10 p.m. FINDINGS: Endotracheal tube tip in the mid intrathoracic trachea 6.7 cm from the carina. Right chest wall Port-A-Cath tip in the mid SVC. Stable cardiomediastinal silhouette. Aortic atherosclerotic calcification. Bibasilar atelectasis. Otherwise no focal consolidation. No pleural effusion or pneumothorax. IMPRESSION: Endotracheal tube tip in the mid intrathoracic trachea. Chest otherwise unchanged from 06/26/2023. Electronically Signed   By: Minerva Fester M.D.   On: 06/16/2023 00:18   CT Head Wo Contrast  Result Date: 06/08/2023 CLINICAL DATA:  Mental status change, unknown cause carotic EXAM: CT HEAD WITHOUT CONTRAST TECHNIQUE: Contiguous axial images were obtained from the base of the skull through the vertex without intravenous contrast. RADIATION DOSE REDUCTION: This exam was performed according to the departmental  dose-optimization program which includes automated exposure control, adjustment of the mA and/or kV according to patient size and/or use of iterative reconstruction technique. COMPARISON:  CT head 02/12/2016. FINDINGS: Brain: The putative meningioma along the anterior left falx is likely mildly increased in size, now measuring up to 1.9 cm with development of internal fat. No evidence of acute large vascular territory infarct, acute hemorrhage, midline shift or hydrocephalus. Vascular: No hyperdense vessel. Skull: No acute fracture. Sinuses/Orbits: Clear sinuses.  No acute orbital findings. Other: No mastoid effusions. IMPRESSION: 1. No acute intracranial abnormality. 2. Chronic putative meningioma along the anterior left falx is likely mildly increased in size, now measuring up to 1.9 cm with development of internal fat. Mild mass effect on the adjacent frontal lobe. An MRI with contrast could confirm and further evaluate if clinically warranted. Electronically Signed   By: Feliberto Harts M.D.   On: 06/26/2023 20:42   DG Chest Port 1 View  Result Date: 06/09/2023 CLINICAL DATA:  Whole-body rash. Patient became violent having hallucinations after change in med. EMS reports patient stopped breathing. EXAM: PORTABLE CHEST 1 VIEW COMPARISON:  05/06/2023 FINDINGS: Right chest wall Port-A-Cath tip in the low SVC. Stable cardiomediastinal silhouette. Aortic atherosclerotic calcification. Bibasilar atelectasis. No pleural effusion or pneumothorax. No displaced rib fractures. IMPRESSION: No active disease. Electronically Signed   By: Minerva Fester M.D.   On: 06/08/2023 20:04    Microbiology Recent Results (from the past 240 hour(s))  Culture, blood (single)     Status: None   Collection Time: 06/17/2023  6:02 PM   Specimen: BLOOD  Result Value Ref Range Status   Specimen Description BLOOD LEFT ANTECUBITAL  Final   Special Requests   Final    BOTTLES DRAWN AEROBIC AND ANAEROBIC Blood Culture adequate volume    Culture   Final    NO GROWTH 5 DAYS Performed at [facility], [address] [place], [place] [address]    Report Status 06/20/2023 FINAL  Final  Blood culture (routine single)     Status: None   Collection Time: 06/26/2023  6:03 PM   Specimen: BLOOD  Result Value Ref Range Status   Specimen Description BLOOD BLOOD RIGHT ARM  Final   Special Requests   Final    BOTTLES DRAWN AEROBIC AND ANAEROBIC Blood Culture results [place] not be optimal due to an excessive volume of blood received in culture bottles   Culture  Final    NO GROWTH 5 DAYS Performed at [facility], [address] [place] [place], [place] [address]    Report Status 06/20/2023 FINAL  Final  Urine Culture     Status: None   Collection Time: 06/03/2023  7:17 PM   Specimen: Urine, Random  Result Value Ref Range Status   Specimen Description   Final    URINE, RANDOM Performed at [facility], [address] [place], [place] [address]    Special Requests   Final    NONE Reflexed from [id number] Performed at [facility], [address] [place], [place] [address]    Culture   Final    NO GROWTH Performed at [facility], [address], [place], [place] [address]    Report Status 06/17/2023 FINAL  Final  SARS Coronavirus 2 by RT PCR (hospital order, performed in [facility] hospital lab) *cepheid single result test* Anterior Nasal Swab     Status: None   Collection Time: 06/16/23 12:25 AM   Specimen: Anterior Nasal Swab  Result Value Ref Range Status   SARS Coronavirus 2 by RT PCR NEGATIVE NEGATIVE Final    Comment: (NOTE) SARS-CoV-2 target nucleic acids are NOT DETECTED.  The SARS-CoV-2 RNA is generally detectable in upper and lower respiratory specimens during the acute phase of infection. The lowest concentration of SARS-CoV-2 viral copies this assay can detect is 250 copies / mL. A negative result does not preclude SARS-CoV-2 infection and should not be used as the sole  basis for treatment or other patient management decisions.  A negative result Bhardwaj occur with improper specimen collection / handling, submission of specimen other than nasopharyngeal swab, presence of viral mutation(s) within the areas targeted by this assay, and inadequate number of viral copies (<250 copies / mL). A negative result must be combined with clinical observations, patient history, and epidemiological information.  Fact Sheet for Patients:   RoadLapTop.co.za  Fact Sheet for Healthcare Providers: http://kim-miller.com/  This test is not yet approved or  cleared by the [place] FDA and has been authorized for detection and/or diagnosis of SARS-CoV-2 by FDA under an Emergency Use Authorization (EUA).  This EUA will remain in effect (meaning this test can be used) for the duration of the COVID-19 declaration under Section 564(b)(1) of the Act, 21 U.S.C. section 360bbb-3(b)(1), unless the authorization is terminated or revoked sooner.  Performed at [facility], [address] [place], [place] [address]   MRSA Next Gen by PCR, Nasal     Status: None   Collection Time: 06/16/23 12:25 AM   Specimen: Anterior Nasal Swab  Result Value Ref Range Status   MRSA by PCR Next Gen NOT DETECTED NOT DETECTED Final    Comment: (NOTE) The GeneXpert MRSA Assay (FDA approved for NASAL specimens only), is one component of a comprehensive MRSA colonization surveillance program. It is not intended to diagnose MRSA infection nor to guide or monitor treatment for MRSA infections. Test performance is not FDA approved in patients less than 46 years old. Performed at [facility], [address] [place], [place] [address]   Respiratory (~20 pathogens) panel by PCR     Status: None   Collection Time: 06/16/23  2:36 AM   Specimen: Nasopharyngeal Swab; Respiratory  Result Value Ref Range Status   Adenovirus NOT DETECTED NOT  DETECTED Final   Coronavirus 229E NOT DETECTED NOT DETECTED Final    Comment: (NOTE) The Coronavirus on the Respiratory Panel,  DOES NOT test for the novel  Coronavirus (2019 nCoV)    Coronavirus HKU1 NOT DETECTED NOT DETECTED Final   Coronavirus NL63 NOT DETECTED NOT DETECTED Final   Coronavirus OC43 NOT DETECTED NOT DETECTED Final   Metapneumovirus NOT DETECTED NOT DETECTED Final   Rhinovirus / Enterovirus NOT DETECTED NOT DETECTED Final   Influenza A NOT DETECTED NOT DETECTED Final   Influenza B NOT DETECTED NOT DETECTED Final   Parainfluenza Virus 1 NOT DETECTED NOT DETECTED Final   Parainfluenza Virus 2 NOT DETECTED NOT DETECTED Final   Parainfluenza Virus 3 NOT DETECTED NOT DETECTED Final   Parainfluenza Virus 4 NOT DETECTED NOT DETECTED Final   Respiratory Syncytial Virus NOT DETECTED NOT DETECTED Final   Bordetella pertussis NOT DETECTED NOT DETECTED Final   Bordetella Parapertussis NOT DETECTED NOT DETECTED Final   Chlamydophila pneumoniae NOT DETECTED NOT DETECTED Final   Mycoplasma pneumoniae NOT DETECTED NOT DETECTED Final    Comment: Performed at [facility], [address], [place], [place] [address]  MRSA Next Gen by PCR, Nasal     Status: None   Collection Time: 06/18/23 10:21 AM   Specimen: Nasal Mucosa; Nasal Swab  Result Value Ref Range Status   MRSA by PCR Next Gen NOT DETECTED NOT DETECTED Final    Comment: (NOTE) The GeneXpert MRSA Assay (FDA approved for NASAL specimens only), is one component of a comprehensive MRSA colonization surveillance program. It is not intended to diagnose MRSA infection nor to guide or monitor treatment for MRSA infections. Test performance is not FDA approved in patients less than 17 years old. Performed at [facility], [address] [place], [place] [address]   Culture, blood (Routine X 2) w Reflex to ID Panel     Status: None   Collection Time: 06/18/23 11:02 AM   Specimen: BLOOD  Result Value Ref Range  Status   Specimen Description BLOOD BLOOD LEFT ARM blue bottle  Final   Special Requests   Final    BOTTLES DRAWN AEROBIC ONLY Blood Culture adequate volume   Culture   Final    NO GROWTH 5 DAYS Performed at [facility], [address] [place], [place] [address]    Report Status 06/23/2023 FINAL  Final  Culture, blood (Routine X 2) w Reflex to ID Panel     Status: None   Collection Time: 06/18/23 11:14 AM   Specimen: BLOOD  Result Value Ref Range Status   Specimen Description BLOOD chest port  Final   Special Requests   Final    BOTTLES DRAWN AEROBIC ONLY Blood Culture results [place] not be optimal due to an excessive volume of blood received in culture bottles   Culture   Final    NO GROWTH 5 DAYS Performed at [facility], [address] [place], [place] [address]    Report Status 06/14/2023 FINAL  Final  Culture, Respiratory w Gram Stain     Status: None   Collection Time: 06/18/23 12:50 PM   Specimen: Tracheal Aspirate; Respiratory  Result Value Ref Range Status   Specimen Description   Final    TRACHEAL ASPIRATE Performed at [facility], [address], [place], [place] [address]    Special Requests   Final    NONE Performed at [facility], [address] [place], [place] [address]    Gram Stain   Final    MODERATE WBC PRESENT, PREDOMINANTLY PMN FEW BUDDING YEAST SEEN Performed at [facility], [address], [place], [place] [address]    Culture   Final    FEW PSEUDOMONAS AERUGINOSA MODERATE CANDIDA ALBICANS    Report Status 06/20/2023 FINAL  Final   Organism ID, Bacteria PSEUDOMONAS AERUGINOSA  Final      Susceptibility   Pseudomonas aeruginosa - MIC*    CEFTAZIDIME 4 SENSITIVE Sensitive     CIPROFLOXACIN <=0.25 SENSITIVE Sensitive     GENTAMICIN 2 SENSITIVE Sensitive     IMIPENEM 2 SENSITIVE Sensitive     PIP/TAZO 8 SENSITIVE Sensitive ug/mL    CEFEPIME 4 SENSITIVE Sensitive     * FEW PSEUDOMONAS AERUGINOSA   Culture, BAL-quantitative w Gram Stain     Status: Abnormal   Collection Time: 06/21/23 12:45 PM   Specimen: Bronchoalveolar Lavage; Respiratory  Result Value Ref Range Status   Specimen Description   Final    BRONCHIAL ALVEOLAR LAVAGE Performed at [facility], [address] [place], [place] [address]    Special Requests   Final    NONE Performed at [facility], [address] [place], [place] [address]    Gram Stain   Final    MODERATE WBC PRESENT, PREDOMINANTLY PMN RARE YEAST WITH PSEUDOHYPHAE CRITICAL RESULT CALLED TO, READ BACK BY AND VERIFIED WITH: RN [facility] ON 06/21/23 @ 2132 BY DRT Performed at [facility], [address], [place], [place] [address]    Culture (A)  Final    40,000 COLONIES/mL PSEUDOMONAS AERUGINOSA SUSCEPTIBILITIES PERFORMED ON PREVIOUS CULTURE WITHIN THE LAST 5 DAYS. 30,000 COLONIES/mL CANDIDA ALBICANS    Report Status 06/23/2023 FINAL  Final    Lab Basic Metabolic Panel: Recent Labs  Lab 06/19/23 0340 06/20/23 0453 06/21/23 0419 06/22/23 0355 06/23/23 0400 06/19/2023 0404  NA 141 142 144 145 148* 148*  K 4.0 3.2* 3.8 3.6 3.5 3.8  CL 114* 116* 119* 121* 119* 117*  CO2 19* 18* 20* 20* 20* 23  GLUCOSE 190* 231* 158* 228* 243* 257*  BUN 55* 50* 50* 50* 55* 58*  CREATININE 2.17* 1.81* 1.78* 1.58* 1.65* 1.42*  CALCIUM 7.4* 7.0* 7.5* 7.8* 7.9* 8.0*  MG 2.1  --  1.9 1.8 2.1 1.9  PHOS 2.9  --  2.7 3.3 3.4 3.6   Liver Function Tests: Recent Labs  Lab 06/19/23 0340  AST 46*  ALT 33  ALKPHOS 293*  BILITOT 0.9  PROT 4.2*  ALBUMIN 1.5*   No results for input(s): "LIPASE", "AMYLASE" in the last 168 hours. No results for input(s): "AMMONIA" in the last 168 hours. CBC: Recent Labs  Lab 06/19/23 0340 06/20/23 0923 06/21/23 0419 06/22/23 0355 06/23/23 0400 06/23/23 0438 06/23/23 1903 06/21/2023 0404  WBC 19.6*   < > 12.5* 8.9 9.4 9.2  --  9.1  NEUTROABS 15.8*  --   --   --   --   --   --   --   HGB  8.2*   < > 8.7* 8.1* 6.8* 6.6* 8.4* 8.0*  HCT 26.6*   < > 28.9* 26.1* 22.2* 21.9* 26.9* 25.9*  MCV 93.7   < > 94.1 92.9 95.3 96.5  --  95.2  PLT 109*   < > 123* 101* 110* 117*  --  121*   < > = values in this interval not displayed.   Cardiac Enzymes: No results for input(s): "CKTOTAL", "CKMB", "CKMBINDEX", "TROPONINI" in the last 168 hours. Sepsis Labs: Recent Labs  Lab 06/18/23 0406 06/18/23 0407 06/18/23 1018 06/18/23 1241 06/19/23  0340 06/22/23 0355 06/23/23 0400 06/23/23 0438 06/14/2023 0404  PROCALCITON 4.56  --   --   --   --   --   --   --   --   WBC  --    < >  --   --    < > 8.9 9.4 9.2 9.1  LATICACIDVEN  --   --  1.6 1.8  --   --   --   --   --    < > = values in this interval not displayed.    Procedures/Operations  11/13: Intubation 11/16: Arterial Line 11/19: ETT exchange, Bronchoscopy   Loraine Leriche 06/28/2023, 12:16 AM

## 2023-07-03 NOTE — Progress Notes (Signed)
Pt having increasing WOB throughout shift. Escalated to BiPap, and precedex gtt ordered so pt could tolerate mask. Pt RR still high on Bipap and not pulling adequate volumes. Pt soon started to desaturate on 100% Fi02 w/ frequent agonal episodes. At this time Ouma, NP contacted family and decision made to progress to comfort care measures. Pt taken off Bipap and placed on [facility] for comfort. Pt passed 06/11/2023 at 2326. Healthcare staff was at bedside when pt passed, he did not appear to be in any distress. Family arrived shortly afterwards and appeared to be grieving appropriately. All questions answered and concerns addressed.

## 2023-07-03 NOTE — Telephone Encounter (Signed)
Notification received of Gilbert [name] passing overnight. I am sad to see this.  I called daughter Gilbert [name] and expressed my condolences on the loss of her father, and asked that she share this also with her sister Gilbert [name].  I let her know that her dad, her and her sister have been in my thoughts and prayers while he has been hospitalized and will continue to do so during this difficult time.  Additionally let her know that it was an honor to serve as his primary care doctor.  Advised to let us know if there is anything we can do during this time.  She thanked me and noted she appreciated the call.

## 2023-07-03 DEATH — deceased

## 2023-10-03 ENCOUNTER — Ambulatory Visit: Payer: Medicare Other | Admitting: Dermatology
